# Patient Record
Sex: Female | Born: 1945 | Race: Black or African American | Hispanic: No | Smoking: Current some day smoker
Health system: Southern US, Community
[De-identification: ages and names within clinical notes are randomized; demographics above are authoritative.]

## PROBLEM LIST (undated history)

## (undated) DIAGNOSIS — J302 Other seasonal allergic rhinitis: Secondary | ICD-10-CM

## (undated) DIAGNOSIS — T7840XA Allergy, unspecified, initial encounter: Secondary | ICD-10-CM

## (undated) DIAGNOSIS — F1010 Alcohol abuse, uncomplicated: Secondary | ICD-10-CM

## (undated) DIAGNOSIS — Z8619 Personal history of other infectious and parasitic diseases: Secondary | ICD-10-CM

## (undated) DIAGNOSIS — G4700 Insomnia, unspecified: Secondary | ICD-10-CM

## (undated) DIAGNOSIS — K219 Gastro-esophageal reflux disease without esophagitis: Secondary | ICD-10-CM

## (undated) DIAGNOSIS — E7800 Pure hypercholesterolemia, unspecified: Secondary | ICD-10-CM

## (undated) DIAGNOSIS — M1990 Unspecified osteoarthritis, unspecified site: Secondary | ICD-10-CM

## (undated) DIAGNOSIS — L409 Psoriasis, unspecified: Secondary | ICD-10-CM

## (undated) HISTORY — DX: Allergy, unspecified, initial encounter: T78.40XA

## (undated) HISTORY — PX: TUBAL LIGATION: SHX77

## (undated) HISTORY — PX: ABDOMINAL HYSTERECTOMY: SHX81

---

## 2001-09-27 ENCOUNTER — Emergency Department (HOSPITAL_COMMUNITY): Admission: EM | Admit: 2001-09-27 | Discharge: 2001-09-27 | Payer: Self-pay | Admitting: Emergency Medicine

## 2002-03-22 ENCOUNTER — Encounter: Admission: RE | Admit: 2002-03-22 | Discharge: 2002-03-22 | Payer: Self-pay | Admitting: Family Medicine

## 2002-03-22 ENCOUNTER — Encounter: Payer: Self-pay | Admitting: Family Medicine

## 2003-11-08 ENCOUNTER — Encounter: Admission: RE | Admit: 2003-11-08 | Discharge: 2003-11-08 | Payer: Self-pay | Admitting: Family Medicine

## 2004-03-28 ENCOUNTER — Encounter: Admission: RE | Admit: 2004-03-28 | Discharge: 2004-03-28 | Payer: Self-pay | Admitting: Family Medicine

## 2004-11-25 ENCOUNTER — Emergency Department (HOSPITAL_COMMUNITY): Admission: EM | Admit: 2004-11-25 | Discharge: 2004-11-25 | Payer: Self-pay | Admitting: Emergency Medicine

## 2004-12-05 ENCOUNTER — Ambulatory Visit (HOSPITAL_COMMUNITY): Admission: RE | Admit: 2004-12-05 | Discharge: 2004-12-05 | Payer: Self-pay | Admitting: Orthopedic Surgery

## 2004-12-05 ENCOUNTER — Ambulatory Visit (HOSPITAL_BASED_OUTPATIENT_CLINIC_OR_DEPARTMENT_OTHER): Admission: RE | Admit: 2004-12-05 | Discharge: 2004-12-05 | Payer: Self-pay | Admitting: Orthopedic Surgery

## 2005-04-28 ENCOUNTER — Emergency Department (HOSPITAL_COMMUNITY): Admission: EM | Admit: 2005-04-28 | Discharge: 2005-04-28 | Payer: Self-pay | Admitting: Emergency Medicine

## 2005-09-18 ENCOUNTER — Ambulatory Visit (HOSPITAL_COMMUNITY): Admission: RE | Admit: 2005-09-18 | Discharge: 2005-09-18 | Payer: Self-pay | Admitting: Orthopedic Surgery

## 2005-09-18 ENCOUNTER — Ambulatory Visit (HOSPITAL_BASED_OUTPATIENT_CLINIC_OR_DEPARTMENT_OTHER): Admission: RE | Admit: 2005-09-18 | Discharge: 2005-09-18 | Payer: Self-pay | Admitting: Orthopedic Surgery

## 2005-10-17 ENCOUNTER — Encounter: Admission: RE | Admit: 2005-10-17 | Discharge: 2005-10-17 | Payer: Self-pay | Admitting: Family Medicine

## 2006-05-19 ENCOUNTER — Ambulatory Visit: Payer: Self-pay | Admitting: Family Medicine

## 2006-08-19 ENCOUNTER — Ambulatory Visit: Payer: Self-pay | Admitting: Family Medicine

## 2006-10-17 ENCOUNTER — Ambulatory Visit: Payer: Self-pay | Admitting: Family Medicine

## 2006-12-22 ENCOUNTER — Ambulatory Visit: Payer: Self-pay | Admitting: Gastroenterology

## 2007-01-04 DIAGNOSIS — E785 Hyperlipidemia, unspecified: Secondary | ICD-10-CM | POA: Insufficient documentation

## 2007-01-04 DIAGNOSIS — M1250 Traumatic arthropathy, unspecified site: Secondary | ICD-10-CM | POA: Insufficient documentation

## 2007-01-04 DIAGNOSIS — G4700 Insomnia, unspecified: Secondary | ICD-10-CM

## 2007-01-04 DIAGNOSIS — J309 Allergic rhinitis, unspecified: Secondary | ICD-10-CM | POA: Insufficient documentation

## 2007-01-05 ENCOUNTER — Encounter (INDEPENDENT_AMBULATORY_CARE_PROVIDER_SITE_OTHER): Payer: Self-pay | Admitting: Specialist

## 2007-01-05 ENCOUNTER — Ambulatory Visit: Payer: Self-pay | Admitting: Gastroenterology

## 2007-01-19 ENCOUNTER — Ambulatory Visit: Payer: Self-pay | Admitting: Family Medicine

## 2007-01-20 ENCOUNTER — Encounter: Admission: RE | Admit: 2007-01-20 | Discharge: 2007-01-20 | Payer: Self-pay | Admitting: Family Medicine

## 2007-04-24 DIAGNOSIS — K219 Gastro-esophageal reflux disease without esophagitis: Secondary | ICD-10-CM

## 2007-04-24 DIAGNOSIS — J45909 Unspecified asthma, uncomplicated: Secondary | ICD-10-CM

## 2007-04-27 ENCOUNTER — Ambulatory Visit: Payer: Self-pay | Admitting: Family Medicine

## 2007-04-27 LAB — CONVERTED CEMR LAB
ALT: 20 units/L (ref 0–40)
Cholesterol: 277 mg/dL (ref 0–200)
Direct LDL: 146.1 mg/dL
HDL: 90.2 mg/dL (ref >39.0)
VLDL: 39 mg/dL (ref 0–40)

## 2007-04-28 ENCOUNTER — Telehealth (INDEPENDENT_AMBULATORY_CARE_PROVIDER_SITE_OTHER): Payer: Self-pay | Admitting: *Deleted

## 2007-05-22 ENCOUNTER — Ambulatory Visit: Payer: Self-pay | Admitting: Family Medicine

## 2007-05-22 DIAGNOSIS — Z8739 Personal history of other diseases of the musculoskeletal system and connective tissue: Secondary | ICD-10-CM

## 2007-05-22 DIAGNOSIS — J209 Acute bronchitis, unspecified: Secondary | ICD-10-CM

## 2007-06-14 ENCOUNTER — Emergency Department (HOSPITAL_COMMUNITY): Admission: EM | Admit: 2007-06-14 | Discharge: 2007-06-14 | Payer: Self-pay | Admitting: Emergency Medicine

## 2007-07-06 ENCOUNTER — Telehealth (INDEPENDENT_AMBULATORY_CARE_PROVIDER_SITE_OTHER): Payer: Self-pay | Admitting: *Deleted

## 2007-07-07 ENCOUNTER — Ambulatory Visit: Payer: Self-pay | Admitting: Family Medicine

## 2007-07-07 DIAGNOSIS — R300 Dysuria: Secondary | ICD-10-CM | POA: Insufficient documentation

## 2007-07-07 DIAGNOSIS — B373 Candidiasis of vulva and vagina: Secondary | ICD-10-CM

## 2007-07-07 LAB — CONVERTED CEMR LAB
Bilirubin Urine: NEGATIVE
Blood in Urine, dipstick: NEGATIVE
Glucose, Urine, Semiquant: NEGATIVE
Specific Gravity, Urine: <1.005
WBC Urine, dipstick: NEGATIVE

## 2007-07-09 ENCOUNTER — Telehealth (INDEPENDENT_AMBULATORY_CARE_PROVIDER_SITE_OTHER): Payer: Self-pay | Admitting: *Deleted

## 2007-07-10 ENCOUNTER — Telehealth (INDEPENDENT_AMBULATORY_CARE_PROVIDER_SITE_OTHER): Payer: Self-pay | Admitting: Family Medicine

## 2007-07-10 ENCOUNTER — Encounter (INDEPENDENT_AMBULATORY_CARE_PROVIDER_SITE_OTHER): Payer: Self-pay | Admitting: *Deleted

## 2007-08-21 ENCOUNTER — Ambulatory Visit: Payer: Self-pay | Admitting: Family Medicine

## 2007-08-21 DIAGNOSIS — B853 Phthiriasis: Secondary | ICD-10-CM

## 2007-11-02 ENCOUNTER — Telehealth (INDEPENDENT_AMBULATORY_CARE_PROVIDER_SITE_OTHER): Payer: Self-pay | Admitting: *Deleted

## 2007-11-14 ENCOUNTER — Observation Stay (HOSPITAL_COMMUNITY): Admission: EM | Admit: 2007-11-14 | Discharge: 2007-11-15 | Payer: Self-pay | Admitting: Emergency Medicine

## 2007-11-17 ENCOUNTER — Encounter (INDEPENDENT_AMBULATORY_CARE_PROVIDER_SITE_OTHER): Payer: Self-pay | Admitting: *Deleted

## 2007-11-17 ENCOUNTER — Ambulatory Visit: Payer: Self-pay | Admitting: Family Medicine

## 2007-11-17 DIAGNOSIS — N390 Urinary tract infection, site not specified: Secondary | ICD-10-CM

## 2007-11-18 LAB — CONVERTED CEMR LAB
Chloride: 102 meq/L (ref 96–112)
Creatinine, Ser: 0.7 mg/dL (ref 0.4–1.2)
Glucose, Bld: 89 mg/dL (ref 70–99)
Potassium: 3.4 meq/L — ABNORMAL LOW (ref 3.5–5.1)
Sodium: 139 meq/L (ref 135–145)

## 2007-11-19 ENCOUNTER — Encounter (INDEPENDENT_AMBULATORY_CARE_PROVIDER_SITE_OTHER): Payer: Self-pay | Admitting: *Deleted

## 2007-11-23 ENCOUNTER — Telehealth (INDEPENDENT_AMBULATORY_CARE_PROVIDER_SITE_OTHER): Payer: Self-pay | Admitting: *Deleted

## 2008-01-05 ENCOUNTER — Telehealth (INDEPENDENT_AMBULATORY_CARE_PROVIDER_SITE_OTHER): Payer: Self-pay | Admitting: *Deleted

## 2008-01-08 ENCOUNTER — Telehealth (INDEPENDENT_AMBULATORY_CARE_PROVIDER_SITE_OTHER): Payer: Self-pay | Admitting: *Deleted

## 2008-01-08 ENCOUNTER — Ambulatory Visit: Payer: Self-pay | Admitting: Family Medicine

## 2008-01-08 ENCOUNTER — Encounter (INDEPENDENT_AMBULATORY_CARE_PROVIDER_SITE_OTHER): Payer: Self-pay | Admitting: *Deleted

## 2008-01-12 ENCOUNTER — Ambulatory Visit: Payer: Self-pay | Admitting: Family Medicine

## 2008-01-12 DIAGNOSIS — N760 Acute vaginitis: Secondary | ICD-10-CM | POA: Insufficient documentation

## 2008-01-13 ENCOUNTER — Telehealth (INDEPENDENT_AMBULATORY_CARE_PROVIDER_SITE_OTHER): Payer: Self-pay | Admitting: *Deleted

## 2008-01-13 LAB — CONVERTED CEMR LAB
Chlamydia, DNA Probe: NEGATIVE
GC Probe Amp, Genital: NEGATIVE

## 2008-03-01 ENCOUNTER — Encounter: Admission: RE | Admit: 2008-03-01 | Discharge: 2008-03-01 | Payer: Self-pay | Admitting: Family Medicine

## 2008-03-03 ENCOUNTER — Encounter (INDEPENDENT_AMBULATORY_CARE_PROVIDER_SITE_OTHER): Payer: Self-pay | Admitting: *Deleted

## 2008-04-26 ENCOUNTER — Telehealth (INDEPENDENT_AMBULATORY_CARE_PROVIDER_SITE_OTHER): Payer: Self-pay | Admitting: *Deleted

## 2008-06-23 ENCOUNTER — Emergency Department (HOSPITAL_COMMUNITY): Admission: EM | Admit: 2008-06-23 | Discharge: 2008-06-23 | Payer: Self-pay | Admitting: Family Medicine

## 2008-06-23 ENCOUNTER — Ambulatory Visit: Payer: Self-pay | Admitting: *Deleted

## 2008-07-20 ENCOUNTER — Emergency Department (HOSPITAL_COMMUNITY): Admission: EM | Admit: 2008-07-20 | Discharge: 2008-07-20 | Payer: Self-pay | Admitting: Emergency Medicine

## 2008-07-27 ENCOUNTER — Ambulatory Visit: Payer: Self-pay | Admitting: Internal Medicine

## 2008-08-02 ENCOUNTER — Ambulatory Visit (HOSPITAL_COMMUNITY): Admission: RE | Admit: 2008-08-02 | Discharge: 2008-08-02 | Payer: Self-pay | Admitting: Internal Medicine

## 2008-08-18 ENCOUNTER — Ambulatory Visit (HOSPITAL_COMMUNITY): Admission: RE | Admit: 2008-08-18 | Discharge: 2008-08-18 | Payer: Self-pay | Admitting: Internal Medicine

## 2008-10-05 ENCOUNTER — Ambulatory Visit: Payer: Self-pay | Admitting: Internal Medicine

## 2008-11-11 HISTORY — PX: MENISECTOMY: SHX5181

## 2008-11-24 ENCOUNTER — Ambulatory Visit: Payer: Self-pay | Admitting: Internal Medicine

## 2008-12-23 ENCOUNTER — Ambulatory Visit: Payer: Self-pay | Admitting: Internal Medicine

## 2009-01-09 ENCOUNTER — Ambulatory Visit: Payer: Self-pay | Admitting: Internal Medicine

## 2009-02-01 ENCOUNTER — Ambulatory Visit: Payer: Self-pay | Admitting: Internal Medicine

## 2009-03-15 ENCOUNTER — Ambulatory Visit: Payer: Self-pay | Admitting: Internal Medicine

## 2009-05-04 ENCOUNTER — Ambulatory Visit: Payer: Self-pay | Admitting: Internal Medicine

## 2009-05-11 ENCOUNTER — Ambulatory Visit: Payer: Self-pay | Admitting: Internal Medicine

## 2009-06-28 ENCOUNTER — Ambulatory Visit: Payer: Self-pay | Admitting: Internal Medicine

## 2009-08-03 ENCOUNTER — Telehealth (INDEPENDENT_AMBULATORY_CARE_PROVIDER_SITE_OTHER): Payer: Self-pay | Admitting: *Deleted

## 2009-09-27 ENCOUNTER — Ambulatory Visit: Payer: Self-pay | Admitting: Internal Medicine

## 2009-10-13 ENCOUNTER — Ambulatory Visit: Payer: Self-pay | Admitting: Internal Medicine

## 2010-01-05 ENCOUNTER — Ambulatory Visit: Payer: Self-pay | Admitting: Internal Medicine

## 2010-05-01 ENCOUNTER — Ambulatory Visit: Payer: Self-pay | Admitting: Internal Medicine

## 2010-06-14 ENCOUNTER — Emergency Department (HOSPITAL_COMMUNITY): Admission: EM | Admit: 2010-06-14 | Discharge: 2010-06-14 | Payer: Self-pay | Admitting: Emergency Medicine

## 2010-06-25 ENCOUNTER — Ambulatory Visit: Payer: Self-pay | Admitting: Internal Medicine

## 2010-07-05 ENCOUNTER — Encounter: Admission: RE | Admit: 2010-07-05 | Discharge: 2010-07-05 | Payer: Self-pay | Admitting: General Surgery

## 2010-08-20 ENCOUNTER — Ambulatory Visit: Payer: Self-pay | Admitting: Internal Medicine

## 2010-08-20 ENCOUNTER — Encounter (INDEPENDENT_AMBULATORY_CARE_PROVIDER_SITE_OTHER): Payer: Self-pay | Admitting: Internal Medicine

## 2010-08-20 LAB — CONVERTED CEMR LAB
ALT: 27 units/L (ref 0–35)
BUN: 16 mg/dL (ref 6–23)
Basophils Absolute: 0 10*3/uL (ref 0.0–0.1)
CO2: 27 meq/L (ref 19–32)
Calcium: 9.9 mg/dL (ref 8.4–10.5)
Chloride: 102 meq/L (ref 96–112)
Creatinine, Ser: 0.75 mg/dL (ref 0.40–1.20)
Glucose, Bld: 87 mg/dL (ref 70–99)
Hemoglobin: 12.9 g/dL (ref 12.0–15.0)
Lymphocytes Relative: 49 % — ABNORMAL HIGH (ref 12–46)
Lymphs Abs: 2.6 10*3/uL (ref 0.7–4.0)
Monocytes Absolute: 0.5 10*3/uL (ref 0.1–1.0)
Monocytes Relative: 9 % (ref 3–12)
Neutro Abs: 2.3 10*3/uL (ref 1.7–7.7)
RBC: 4.11 M/uL (ref 3.87–5.11)
WBC: 5.4 10*3/uL (ref 4.0–10.5)

## 2010-08-22 ENCOUNTER — Ambulatory Visit (HOSPITAL_COMMUNITY): Admission: RE | Admit: 2010-08-22 | Discharge: 2010-08-22 | Payer: Self-pay | Admitting: Internal Medicine

## 2010-10-23 ENCOUNTER — Emergency Department (HOSPITAL_COMMUNITY)
Admission: EM | Admit: 2010-10-23 | Discharge: 2010-10-23 | Payer: Self-pay | Source: Home / Self Care | Admitting: Emergency Medicine

## 2010-12-02 ENCOUNTER — Encounter: Payer: Self-pay | Admitting: Family Medicine

## 2010-12-03 ENCOUNTER — Observation Stay (HOSPITAL_COMMUNITY)
Admission: EM | Admit: 2010-12-03 | Discharge: 2010-12-04 | Payer: Self-pay | Source: Home / Self Care | Attending: Internal Medicine | Admitting: Internal Medicine

## 2010-12-04 LAB — COMPREHENSIVE METABOLIC PANEL
AST: 37 U/L (ref 0–37)
Alkaline Phosphatase: 93 U/L (ref 39–117)
Alkaline Phosphatase: 88 U/L (ref 39–117)
BUN: 8 mg/dL (ref 6–23)
CO2: 25 mEq/L (ref 19–32)
CO2: 26 mEq/L (ref 19–32)
Calcium: 9.2 mg/dL (ref 8.4–10.5)
Chloride: 106 mEq/L (ref 96–112)
Chloride: 107 mEq/L (ref 96–112)
Creatinine, Ser: 0.88 mg/dL (ref 0.4–1.2)
GFR calc Af Amer: >60 mL/min (ref >60)
GFR calc non Af Amer: >60 mL/min (ref >60)
GFR calc non Af Amer: >60 mL/min (ref >60)
Glucose, Bld: 104 mg/dL — ABNORMAL HIGH (ref 70–99)
Potassium: 4 mEq/L (ref 3.5–5.1)
Sodium: 140 mEq/L (ref 135–145)
Total Bilirubin: 0.9 mg/dL (ref 0.3–1.2)
Total Protein: 6.7 g/dL (ref 6.0–8.3)

## 2010-12-04 LAB — APTT: aPTT: 32 seconds (ref 24–37)

## 2010-12-04 LAB — DIFFERENTIAL
Basophils Absolute: 0 10*3/uL (ref 0.0–0.1)
Basophils Relative: 1 % (ref 0–1)
Eosinophils Relative: 1 % (ref 0–5)
Lymphocytes Relative: 42 % (ref 12–46)
Monocytes Absolute: 0.4 10*3/uL (ref 0.1–1.0)
Monocytes Relative: 6 % (ref 3–12)
Neutrophils Relative %: 50 % (ref 43–77)

## 2010-12-04 LAB — CARDIAC PANEL(CRET KIN+CKTOT+MB+TROPI)
Relative Index: INVALID (ref 0.0–2.5)
Total CK: 59 U/L (ref 7–177)

## 2010-12-04 LAB — PROTIME-INR
INR: 0.97 (ref 0.00–1.49)
INR: 0.96 (ref 0.00–1.49)
Prothrombin Time: 13.1 seconds (ref 11.6–15.2)

## 2010-12-04 LAB — RAPID URINE DRUG SCREEN, HOSP PERFORMED
Benzodiazepines: NOT DETECTED
Opiates: NOT DETECTED

## 2010-12-04 LAB — CBC
Hemoglobin: 13 g/dL (ref 12.0–15.0)
MCH: 32.9 pg (ref 26.0–34.0)
MCHC: 34.7 g/dL (ref 30.0–36.0)
MCHC: 34.6 g/dL (ref 30.0–36.0)
MCV: 94.6 fL (ref 78.0–100.0)
MCV: 95.7 fL (ref 78.0–100.0)
Platelets: 284 10*3/uL (ref 150–400)
RBC: 3.93 MIL/uL (ref 3.87–5.11)
RDW: 14.3 % (ref 11.5–15.5)

## 2010-12-04 LAB — RPR: RPR Ser Ql: NONREACTIVE

## 2010-12-04 LAB — URINALYSIS, ROUTINE W REFLEX MICROSCOPIC
Hgb urine dipstick: NEGATIVE
Ketones, ur: NEGATIVE mg/dL
Nitrite: NEGATIVE
Protein, ur: NEGATIVE mg/dL
Urine Glucose, Fasting: NEGATIVE mg/dL
pH: 6 (ref 5.0–8.0)

## 2010-12-04 LAB — POCT CARDIAC MARKERS
CKMB, poc: <1 ng/mL — ABNORMAL LOW (ref 1.0–8.0)
Myoglobin, poc: 68.2 ng/mL (ref 12–200)

## 2010-12-04 LAB — AMMONIA: Ammonia: 21 umol/L (ref 11–35)

## 2010-12-04 LAB — HEMOGLOBIN A1C
Hgb A1c MFr Bld: 5.4 % (ref <5.7)
Mean Plasma Glucose: 108 mg/dL (ref <117)

## 2010-12-05 LAB — LIPID PANEL
Cholesterol: 205 mg/dL — ABNORMAL HIGH (ref 0–200)
HDL: 58 mg/dL (ref >39)
Triglycerides: 183 mg/dL — ABNORMAL HIGH (ref <150)

## 2010-12-05 LAB — URINE CULTURE: Colony Count: 25000

## 2010-12-10 NOTE — Discharge Summary (Signed)
[name], [name]                    ACCOUNT NO.:  [id number]  MEDICAL RECORD NO.:  [id number]          PATIENT TYPE:  INP  LOCATION:  1443                         FACILITY:  [facility]  PHYSICIAN:  Isidor Holts, M.D.  DATE OF BIRTH:  05-May-1946  DATE OF ADMISSION:  12/03/2010 DATE OF DISCHARGE:  12/04/2010                              DISCHARGE SUMMARY   PRIMARY MD:  Dineen Kid. Reche Dixon, M.D., [facility]  PRIMARY ONCOLOGIST:  Dr. Minda Ditto at [facility].  DISCHARGE DIAGNOSES: 1. Transient altered mental status/dysarthria. Possible transient     ischemic attack. 2. 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 Surgery     scheduled for December 27, 2010 at [facility]. 3. Bronchial asthma. 4. Gastroesophageal reflux disease. 5. Osteoarthritis. 6. Smoking history.  DISCHARGE MEDICATIONS: 1. Aspirin 325 mg p.o. daily (he was on 81 mg p.o. daily). 2. Advair Diskus (5/50) 1 puff b.i.d. 3. Albuterol inhaler 2 puffs p.r.n. q.6h. for shortness of     breath/wheeze. 4. Celebrex 200 mg p.o. daily. 5. Famotidine 20 mg p.o. b.i.d. 6. Nasacort AQ one spray each nostril p.r.n. daily for allergies. 7. Premarin 1.25 mg p.o. daily. 8. Singulair 10 mg p.o. daily. 9. Tramadol 50 mg p.o. t.i.d. 10.Trazodone 50 mg p.o. q.h.s. 11.Vistaril 25 mg p.o. t.i.d. 12.Xyzal 5 mg p.o. daily. 13.Zetia 10 mg p.o. daily.  PROCEDURES: 1. Chest x-ray on December 03, 2010:  This showed no active disease. 2. Head CT scan on December 03, 2010:  This showed no acute     intracranial findings.  There was mild progressive atrophy. 3. Brain MRI on December 03, 2010:  This was negative for acute infarct     or mass lesion. 4. Brain MRA on December 03, 2010:  This was a negative study. 5. Bilateral carotid/vertebral artery duplex scan on December 04, 2010:     This showed no evidence of ICA stenosis.  Vertebral artery flow was     antegrade. 6. The 2-D echocardiogram on December 04, 2010:  This  showed normal     left ventricle cavity size.  There was mild concentric hypertrophy.     Systolic function was normal.  Estimated EF in the region of  50%     to 65%, left ventricular diastolic function parameters were normal.  CONSULTATIONS:  None.  ADMISSION HISTORY:  As in H and P notes of December 03, 2010, dictated by Dr. Erick Blinks. However, in brief this is a 65 year old female, with known history of bronchial asthma, GERD, recently diagnosed left renal cell carcinoma, osteoarthritis, smoking history, presenting with a transient episode of confusion and dysarthria, which ocurred when she was getting ready for church in a.m. of December 02, 2010.  She subsequently presented to the emergency department and was admitted for further evaluation, investigation and management on suspicion of possible CVA versus TIA, or other intracranial lesions.  CLINICAL COURSE: 1. TIA:  Patient presented with transient confusion/dysarthria.     Physical examination revealed no focal neurologic deficit.  She     underwent CVA/TIA workup.  For details of findings refer to  procedure list above.  This was negative and patient's rest of     course of hospitalization showed no arrhythmias on telemetric     monitoring neither she have any recurrence of symptoms or indeed     any focal neurologic deficit.  Likely, patient has suffered a TIA.     Aspirin has been increased to 325 mg p.o. daily instead of the 81     mg daily, which she had been on, prior to the admission.  2. Left renal cell carcinoma:  This apparently, was recently diagnosed     and patient is scheduled for surgery at [facility] on     December 27, 2010.  Fortunately, brain imaging studies showed no     evidence of metastatic disease.  She is encouraged to keep her     scheduled appointment at [facility].  3. Bronchial asthma:  Patient was asymptomatic from this viewpoint.  4. GERD:  No new symptoms referable to  this.  5. Osteoarthritis:  This did not prove problematic.  6. Smoking history:  Patient has been counseled appropriately.  DISPOSITION:  Patient was on December 04, 2010 asymptomatic.  There were no new issues.  She was considered clinically stable for discharge and therefore discharged accordingly.  Of note, her lipid profile showed total cholesterol of 205, triglyceride of 183, HDL of 58, LDL of 110. Patient is currently on Zetia, likely this is an excellent lipid profile, although target LDL of less may be beneficial given suspicion of TIA.  We shall defer followup to her primary MD.  Patient's homocysteine level was normal at 13.4.  RPR was negative.  Patient was discharged on December 04, 2010.  ACTIVITY:  As tolerated.  DIET:  Heart-healthy.  FOLLOWUP INSTRUCTIONS:  Patient is to follow up with her primary MD, Dr. Donia Guiles at [facility] per prior scheduled appointment and also with the primary oncologist, Dr. Minda Ditto, per prior scheduled appointment.     Isidor Holts, M.D.     CO/MEDQ  D:  12/04/2010  T:  12/04/2010  Job:  [id number]  cc:   Dineen Kid. Reche Dixon, M.D. Fax: [phone]  Thayer Ohm M.D. Margo Aye  Electronically Signed by Isidor Holts M.D. on 12/10/2010 04:24:04 PM

## 2010-12-12 HISTORY — PX: KIDNEY SURGERY: SHX687

## 2010-12-25 NOTE — H&P (Signed)
[name], [name]                    ACCOUNT NO.:  [id number]  MEDICAL RECORD NO.:  [id number]          PATIENT TYPE:  EMS  LOCATION:  ED                           FACILITY[facility]  PHYSICIAN:  Erick Blinks, MD     DATE OF BIRTH:  Sep 03, 1946  DATE OF ADMISSION:  12/03/2010 DATE OF DISCHARGE:                             HISTORY & PHYSICAL   CHIEF COMPLAINT:  Confusion.  HISTORY OF PRESENT ILLNESS:  This is a 65 year old female with history of asthma; hypercholesterolemia; recently diagnosed renal cancer, followed up at [facility]; and tobacco abuse, who presents to the emergency room with her daughter with complaints of confusion.  According to both the patient and her daughter, the patient was in her usual state of health when yesterday morning her daughter noted that she was increasingly confused and not making any sense when she was speaking. The patient reports getting ready for church yesterday morning and does not remember anything after that until this morning.  According to the daughter, she has not had any fever.  She is not complaining of any dysuria.  She has not had any vomiting or diarrhea or any abdominal pain.  She has no chest pain, shortness of breath, or cough.  She reports that she had noticed that her mother had some slurring of her speech yesterday.  She was increasingly dizzy when she was walking around and she generally was not making any sense.  The patient denies any changes in her vision.  She herself denies any unilateral weakness or numbness.  The patient has had extensive workup in the emergency room which has been relatively unrevealing and she has been referred for admission.  PAST MEDICAL HISTORY: 1. Asthma. 2. GERD. 3. Recently diagnosed renal cancer, followed at [facility] for outpatient     surgery on February 16. 4. Osteoarthritis. 5. Tobacco abuse.  ALLERGIES:  No known drug allergies.  MEDICATIONS:  Prior to admission: 1.  Hydrocodone/acetaminophen. 2. Tylenol. 3. Ambien. 4. Singulair.  SOCIAL HISTORY:  The patient smokes approximately 3 cigarettes per day. She reports drinking 2 light beers every day.  She walks without the assistance of a cane or walker.  FAMILY HISTORY:  Noncontributory.  REVIEW OF SYSTEMS:  All systems have been reviewed and pertinent positives stated in the HPI.  PHYSICAL EXAM:  VITAL SIGNS:  Temperature of 98.2 oral, 99.3 rectal; respiratory rate of 20; heart rate of 100; blood pressure 121/86; oxygen saturation 96% on room air. GENERAL:  The patient is in no acute distress, lying comfortably in bed. HEENT:  Normocephalic, atraumatic.  Pupils are equal, round, reactive to light.  Extraocular motions are intact. NECK:  Supple. CHEST:  Clear to auscultation bilaterally. CARDIAC:  S1, S2 with a regular rate and rhythm. ABDOMEN:  Soft, nontender.  Bowel sounds are active. EXTREMITIES:  No cyanosis, clubbing, or edema. NEUROLOGIC:  The patient has equal strength bilaterally, 5/5.  Plantars are downgoing.  Cranial nerves II-XII are grossly intact.  The patient has no pronator drift.  She does not have any cerebellar signs.  She does have been some slight facial  asymmetry.  Her left nasolabial fold is less pronounced.  LABORATORY DATA:  WBC 5.7, hemoglobin 14, platelets 300.  Cardiac enzymes at point-of-care negative.  EKG does not show any acute ST-T changes.  INR 0.97.  Urine drug screen is negative.  Urinalysis does not show any signs of infection.  Sodium 141, potassium 3.5, chloride 106, bicarb 25, BUN 10, creatinine 0.82, glucose of 104, and calcium of 9.2. CT head shows no acute intracranial findings, mild progressive atrophy. Chest x-ray shows no active disease.  Alcohol level was 6, lactic acid 1.2.  Tylenol level less than 10.  Salicylate less than 4.  Ammonia of 21.  ASSESSMENT AND PLAN: 1. Confusion.  There is concern for transient ischemic attack/stroke     in  this situation.  We will admit the patient to telemetry, check     an MRI of her brain as well as carotid Dopplers and 2-D echo.  We     will also check TSH, B12, and RPR. 2. Tobacco abuse.  She will have a tobacco cessation consult. 3. Asthma, this is stable at this time. 4. Renal cell cancer.  The patient will follow up at [facility] for     further management. 5. Code status.  The patient is a full code.  Note, we will also hold her Ambien and Lortab as this may be contributing to her confusion.     Erick Blinks, MD     JM/MEDQ  D:  12/03/2010  T:  12/03/2010  Job:  [id number]  Electronically Signed by Durward Mallard MEMON  on 12/25/2010 09:04:12 PM

## 2011-01-21 LAB — POCT URINALYSIS DIPSTICK
Glucose, UA: NEGATIVE mg/dL
Nitrite: NEGATIVE
Specific Gravity, Urine: <=1.005 (ref 1.005–1.030)
Urobilinogen, UA: 0.2 mg/dL (ref 0.0–1.0)

## 2011-03-07 ENCOUNTER — Inpatient Hospital Stay (INDEPENDENT_AMBULATORY_CARE_PROVIDER_SITE_OTHER)
Admission: RE | Admit: 2011-03-07 | Discharge: 2011-03-07 | Disposition: A | Discharge status: Home / Self Care | Payer: Self-pay | Source: Ambulatory Visit | Attending: Family Medicine | Admitting: Family Medicine

## 2011-03-07 ENCOUNTER — Emergency Department (HOSPITAL_COMMUNITY)
Admission: EM | Admit: 2011-03-07 | Discharge: 2011-03-07 | Disposition: A | Discharge status: Home / Self Care | Payer: Medicaid Other | Attending: Emergency Medicine | Admitting: Emergency Medicine

## 2011-03-07 DIAGNOSIS — E785 Hyperlipidemia, unspecified: Secondary | ICD-10-CM | POA: Insufficient documentation

## 2011-03-07 DIAGNOSIS — R109 Unspecified abdominal pain: Secondary | ICD-10-CM | POA: Insufficient documentation

## 2011-03-07 DIAGNOSIS — R10812 Left upper quadrant abdominal tenderness: Secondary | ICD-10-CM

## 2011-03-07 DIAGNOSIS — E7800 Pure hypercholesterolemia, unspecified: Secondary | ICD-10-CM | POA: Insufficient documentation

## 2011-03-07 DIAGNOSIS — K219 Gastro-esophageal reflux disease without esophagitis: Secondary | ICD-10-CM | POA: Insufficient documentation

## 2011-03-07 LAB — POCT URINALYSIS DIP (DEVICE)
Bilirubin Urine: NEGATIVE
Glucose, UA: NEGATIVE mg/dL
Hgb urine dipstick: NEGATIVE
Nitrite: NEGATIVE

## 2011-03-26 NOTE — H&P (Signed)
NAMEDONNICE, NIELSEN                    ACCOUNT NO.:  [id number]   MEDICAL RECORD NO.:  [id number]          PATIENT TYPE:  INP   LOCATION:  0103                         FACILITY:  [facility]   PHYSICIAN:  Kela Millin, M.D.DATE OF BIRTH:  1946-04-06   DATE OF ADMISSION:  11/14/2007  DATE OF DISCHARGE:                              HISTORY & PHYSICAL   PRIMARY CARE PHYSICIAN:  Primary care physician is [facility]  on [employer].  Family/patient does not recall physician  name.   CHIEF COMPLAINT:  Confusion.   HISTORY OF PRESENT ILLNESS:  The patient is a 65 year old black female  with past medical history only significant for hyperlipidemia who  presents with the above complaints.  The history is obtained mostly from  the ER staff and records.  It is reported that the patient's husband was  present initially and stated at that time that the patient had been  confused for the past several days and that it had taken him several  hours just to get her dressed because she did know where the closet at  home was.  It was also reported that the patient had urinary  incontinence.  At the time of my exam Ms. Raney is alert.  She is oriented  to place only but she is able to answer questions appropriately.  She  admits to dysuria for the past 3 days.  She denies fevers, cough,  abdominal pain, melena, diarrhea and no hematochezia.   In the ER she had a CT scan of the head done which was negative for any  acute findings.  A urinalysis was done and revealed moderate leukocyte  esterase with urine nitrite positive, urine WBCs too numerous to count  and many bacteria, few epithelial cells on the specimen.  A urine drug  screen was done which was negative.  An alcohol level was also less than  5.  She was admitted for further evaluation and management.   PAST MEDICAL HISTORY:  1. As above.  2. History of tobacco abuse.   MEDICATIONS:  Albuterol, Allegra, aspirin and Lipitor - no  known  dosages.   ALLERGIES:  NKDA.   SOCIAL HISTORY:  Positive for tobacco times 37 years.  She denies  alcohol.   FAMILY HISTORY:  Reviewed and noncontributory to current illness.   REVIEW OF SYSTEMS:  As per HPI.   PHYSICAL EXAMINATION:  GENERAL:  The patient is a pleasant older black  female.  She is in no respiratory distress.  VITAL SIGNS:  Temperature is 98.1 with a blood pressure of 122/81, pulse  of 100, respiratory rate of 16, O2 sat of 100%.  HEENT:  PERRL, EOMI, sclerae anicteric, dry mucous membranes.  No oral  exudates.  NECK:  Supple, no adenopathy, no thyromegaly and no JVD.  LUNGS:  Clear to auscultation bilaterally.  No crackles or wheezes.  CARDIOVASCULAR:  Regular rate and rhythm.  Normal S1-S2.  ABDOMEN:  Soft, bowel sounds present, nontender, nondistended.  No  organomegaly and no masses palpable.  EXTREMITIES:  No cyanosis and no  edema.  NEUROLOGICAL:  She is alert, she is oriented to place only.  She follows  commands.  Cranial nerves II-XII grossly intact.  Strength is 5/5 and  symmetric.  Nonfocal exam.   LABORATORY DATA:  CT scan, urinalysis and urine drug screen as per HPI.  Her white cell count is 9.5, hemoglobin of 14.1, hematocrit 40.7,  platelet count of 331, neutrophil count 58%.  Tylenol level is less than  10.  Salicylate level less than 4.  Sodium is 142, potassium 3.5,  chloride 104, CO2 26, glucose 120, BUN 25, creatinine 1.23, total  protein is 7.3 and her AST is 55, albumin of 4.1.   ASSESSMENT AND PLAN:  1. Urinary tract infection - obtain urine cultures, start on empiric      antibiotics.  2. Confusion - CT scan of head negative as well as urine drug screen.      Likely secondary to #1.  Treat as above and follow.  3. Volume depletion/azotemia - hydrate and recheck.  4. Hyperlipidemia - continue Lipitor.  5. History of tobacco abuse - smoking cessation consult.      Kela Millin, M.D.  Electronically Signed      ACV/MEDQ  D:  11/14/2007  T:  11/14/2007  Job:  [id number]   cc:   Deboraha Sprang FP at [facility]

## 2011-03-29 NOTE — Op Note (Signed)
NAMEDILPREET, FAIRES                    ACCOUNT NO.:  [id number]   MEDICAL RECORD NO.:  [id number]          PATIENT TYPE:  AMB   LOCATION:  DSC                          FACILITY:  [facility]   PHYSICIAN:  Feliberto Gottron. Turner Daniels, M.D.   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   PROCEDURE:  Left knee partial medial meniscectomy, partial meniscectomy and  debridement of chondromalacia from the medial femoral condyle.   SURGEON:  Feliberto Gottron. Turner Daniels, M.D.   FIRST ASSISTANT:  __________ , P.A.-C.   ANESTHETIC:  General endotracheal.   ESTIMATED BLOOD LOSS:  Minimal.   FLUIDS REPLACED:  800 cc of crystalloid.   DRAINS PLACED:  None.   TOURNIQUET TIME:  None.   INDICATIONS FOR PROCEDURE:  A 65 year old woman with symptomatic medial  meniscal tear who has failed conservative treatment and desires elective  arthroscopic evaluation and treatment of same.  Positive McMurray's test,  positive medial joint line tenderness and again she has failed conservative  measures.   DESCRIPTION OF PROCEDURE:  The patient identified by arm band, taken to the  operating room at [facility], appropriate monitors were  attached and general endotracheal anesthesia induced.  With the patient in  the supine position and a lateral post applied to the table, the left lower  extremity was prepped and draped in the usual sterile fashion from the ankle  to the mid thigh.  Using a #11 blade, standard inferomedial and  inferolateral peripatellar portals were then made, allowing introduction of  the arthroscope through the inferolateral portal and the  outflow through the  inferomedial portal.  Diagnostic arthroscopy revealed a normal  patellofemoral joint and suprapatellar pouch.  Moving into the medial  compartment, an obvious displaced parrot beak tear of the medial meniscus  was identified and removed with a 3.5 gator sucker shaver and a straight  biter.  The ACL and PCL were intact.  Moving into the lateral compartment,  some small loose bodies were encountered and removed and there was a  degenerative tearing of the lateral meniscus which was debrided back to a  stable margin as well.  The medial femoral condyle had some grade 2 to grade  3 chondromalacia which was also debrided to the far medial edge.  At this  point, the knee was irrigated out with normal saline solution, the gutters  were cleared medially and laterally, the  posterior horn was visualized going medial and lateral to the PCL and the  arthroscopic instruments removed.  A dressing of Xeroform, 4 x4, dressing,  sponges, Webril, and Ace wrap were applied, the patient was then awakened  and taken to the recovery room without difficulty.      FJR/MEDQ  D:  12/05/2004  T:  12/05/2004  Job:  [id number]

## 2011-03-29 NOTE — Op Note (Signed)
NAMESILVANNA, [name]                    ACCOUNT NO.:  [id number]   MEDICAL RECORD NO.:  [id number]          PATIENT TYPE:  AMB   LOCATION:  DSC                          FACILITY:  [facility]   PHYSICIAN:  Feliberto Gottron. Turner Daniels, M.D.   DATE OF BIRTH:  [birth year]   DATE OF PROCEDURE:  09/18/2005  DATE OF DISCHARGE:                                 OPERATIVE REPORT   PREOPERATIVE DIAGNOSES:  1.  Left knee medial meniscal tear, recurrent.  2.  Right second toe metatarsal Freiberg's infarction.   POSTOPERATIVE DIAGNOSIS:  1.  Left knee medial meniscal tear, recurrent.  2.  Right second toe metatarsal Freiberg's infarction.   PROCEDURE:  1.  Left knee partial arthroscopic medial meniscectomy.  2.  Right second toe Hoffman resection arthroplasty at the MTP joint.   SURGEON:  Feliberto Gottron. Turner Daniels, M.D.   FIRST ASSISTANT:  Erskine Squibb B. Jannet Mantis.   ANESTHETIC:  General LMA.   ESTIMATED BLOOD LOSS:  Minimal.   FLUID REPLACEMENT:  100 cc crystalloid.   DRAINS PLACED:  None.   TOURNIQUET TIME:  On the right ankle only -- was about 12 minutes.   INDICATIONS FOR PROCEDURE:  MRI-proven left knee medial meniscal tear,  recurrent; with pain, catching and popping -- requiring arthroscopic  decompression.  Also on the right side, she has a Freiberg's infarction,  proven by x-ray; painful and refractory to observation and anti-inflammatory  medicine treatment. She desires elective left knee partial medial  meniscectomy and right knee Hoffman resection arthroplasty to decrease pain  and increase function in her left knee and right foot respectively.  The  risks and benefits of surgery were discussed preoperatively and all  questions answered.   DESCRIPTION OF PROCEDURE:  The patient identified by armband, taken to the  operating room at [facility], where the appropriate  anesthetic monitors were attached and general LMA anesthesia induced with  the patient in the supine position. Lateral post  applied to the left side of  the table, and a right ankle tourniquet applied.  Then the left lower  extremity was prepped and draped in the usual sterile fashion from the ankle  to the mid-thigh.  The right foot was prepped and draped in the usual  sterile fashion from the toes to the tourniquet. We began the procedure by  making a standard inferomedial and inferolateral peripatellar portals on the  left knee, allowing introduction of the arthroscope through the  inferolateral portal and the outflow through the inferomedial portal. The  pump pressure was set between 60 and 80 mmHg.  Diagnostic arthroscopy  revealed a normal suprapatellar pouch and patella. Moving into the medial  compartment, further tearing of the posterior medial horn of the medial  meniscus was identified and removed with a 3.5 gator sucker shaver; then  thoroughly probed, confirming removal of the torn cartilage. Moving into the  notch, the ACL and the PCL were intact. The posterolateral horn of the  medial meniscus was also found to be intact. The lateral compartment was  then examined and found to  be in excellent condition. The gutters were  cleared medially and laterally, and the suprapatellar pouch was also  cleared.  The articular cartilage had little, if any, chondromalacia. The  knee was irrigated out with normal saline solution. The arthroscopic  instruments were removed.  A dressing of Xeroform 4x4 dressing sponges,  Webril and an Ace wrap applied.   We then directed our attention to the right foot.  The foot was wrapped with  an Ace wrap for compression.  The tourniquet inflated to 250 mmHg.  A dorsal  midline incision, starting 1 cm distal to the MTP joint and going proximally  for 2.5 cm more was then made through the skin and just into the  subcutaneous tissue. Small bleeders were identified and cauterized. The  retinaculum over the extensor tendon was incised on the fibular side of the  tendon, allowing  [place] to get down to the bone of the metatarsal distally.  We  then stripped periosteum medially and laterally, and medially encountered a  fragmented head of the second metatarsal -- which was removed, including  complete delamination of the articular cartilage.  Baby Hohmann retractors  were placed medially and laterally, allowing [place] to resect the metatarsal  head with the ACL saw, and remove any remaining fragments of bone from the  collateral ligaments.  The wound was then thoroughly irrigated out with  normal saline solution. The tourniquet let down. Small bleeders identified  and cauterized. The toe, which was originally about 4-5 mm longer than the  great toe, settled into a position just shorter than the great toe -- which  should also benefit the patient as well. Satisfied with the position, a  layered closure of the subcutaneous tissue with 3-0 running Vicryl suture,  and the skin with running interlocking 4-0 nylon suture was accomplished.  Then a dressing of Tweeners, 4x4 dressing sponges, Webril and an Ace wrap  and Darco shoe applied.   The patient was then awakened and taken to the recovery room, after also  dressing the left knee with Xeroform 4x4 dressing sponges, Webril and an Ace  wrap.      Feliberto Gottron. Turner Daniels, M.D.  Electronically Signed     FJR/MEDQ  D:  09/18/2005  T:  09/18/2005  Job:  [id number]

## 2011-04-12 HISTORY — PX: BUNIONECTOMY: SHX129

## 2011-05-29 ENCOUNTER — Telehealth: Payer: Self-pay | Admitting: *Deleted

## 2011-05-29 NOTE — Telephone Encounter (Signed)
Pt has never been seen [facility] - has been seeing Dr Reche Dixon through [facility]. Called [facility] and informed not pt [facility]. Stanton Kidney Mehdi Gironda RN 05/29/11 2PM

## 2011-07-13 HISTORY — PX: HERNIA REPAIR: SHX51

## 2011-07-26 ENCOUNTER — Ambulatory Visit: Payer: Medicaid Other | Attending: Podiatry | Admitting: Physical Therapy

## 2011-07-26 DIAGNOSIS — R5381 Other malaise: Secondary | ICD-10-CM | POA: Insufficient documentation

## 2011-07-26 DIAGNOSIS — IMO0001 Reserved for inherently not codable concepts without codable children: Secondary | ICD-10-CM | POA: Insufficient documentation

## 2011-07-26 DIAGNOSIS — M25673 Stiffness of unspecified ankle, not elsewhere classified: Secondary | ICD-10-CM | POA: Insufficient documentation

## 2011-07-26 DIAGNOSIS — M25579 Pain in unspecified ankle and joints of unspecified foot: Secondary | ICD-10-CM | POA: Insufficient documentation

## 2011-07-26 DIAGNOSIS — M25676 Stiffness of unspecified foot, not elsewhere classified: Secondary | ICD-10-CM | POA: Insufficient documentation

## 2011-08-01 ENCOUNTER — Encounter: Payer: Medicaid Other | Admitting: Physical Therapy

## 2011-08-01 LAB — COMPREHENSIVE METABOLIC PANEL
ALT: 68 — ABNORMAL HIGH
AST: 55 — ABNORMAL HIGH
Albumin: 4.1
Alkaline Phosphatase: 119 — ABNORMAL HIGH
CO2: 26
Chloride: 104
GFR calc Af Amer: 54 — ABNORMAL LOW
GFR calc non Af Amer: 45 — ABNORMAL LOW
Potassium: 3.5
Sodium: 142
Total Bilirubin: 1.6 — ABNORMAL HIGH

## 2011-08-01 LAB — RAPID URINE DRUG SCREEN, HOSP PERFORMED
Amphetamines: NOT DETECTED
Barbiturates: NOT DETECTED
Benzodiazepines: NOT DETECTED
Cocaine: NOT DETECTED
Opiates: NOT DETECTED
Tetrahydrocannabinol: NOT DETECTED

## 2011-08-01 LAB — URINALYSIS, ROUTINE W REFLEX MICROSCOPIC
Glucose, UA: 100 — AB
Ketones, ur: 15 — AB
Nitrite: POSITIVE — AB
Protein, ur: >300 — AB
Specific Gravity, Urine: 1.03
Urobilinogen, UA: 1
pH: 5.5

## 2011-08-01 LAB — BASIC METABOLIC PANEL
BUN: 9
Chloride: 112
Creatinine, Ser: 0.87
GFR calc non Af Amer: >60
Glucose, Bld: 114 — ABNORMAL HIGH

## 2011-08-01 LAB — CBC
HCT: 40.7
Hemoglobin: 14.1
MCHC: 34.5
MCV: 96.3
Platelets: 331
RBC: 4.23
RDW: 16.1 — ABNORMAL HIGH
WBC: 9.5

## 2011-08-01 LAB — DIFFERENTIAL
Basophils Absolute: 0.1
Basophils Relative: 1
Eosinophils Absolute: 0
Eosinophils Relative: 0
Lymphocytes Relative: 32
Lymphs Abs: 3
Monocytes Absolute: 0.8
Monocytes Relative: 9
Neutro Abs: 5.5
Neutrophils Relative %: 58

## 2011-08-01 LAB — URINE MICROSCOPIC-ADD ON

## 2011-08-01 LAB — COMPREHENSIVE METABOLIC PANEL WITH GFR
BUN: 25 — ABNORMAL HIGH
Calcium: 9
Creatinine, Ser: 1.23 — ABNORMAL HIGH
Glucose, Bld: 120 — ABNORMAL HIGH
Total Protein: 7.3

## 2011-08-01 LAB — URINE CULTURE: Colony Count: >=100000

## 2011-08-01 LAB — ACETAMINOPHEN LEVEL: Acetaminophen (Tylenol), Serum: <10 — ABNORMAL LOW

## 2011-08-01 LAB — SALICYLATE LEVEL: Salicylate Lvl: <4

## 2011-08-01 LAB — ETHANOL: Alcohol, Ethyl (B): <5

## 2011-08-02 ENCOUNTER — Ambulatory Visit: Payer: Medicaid Other | Admitting: Physical Therapy

## 2011-08-02 ENCOUNTER — Encounter: Payer: Medicaid Other | Admitting: Physical Therapy

## 2011-08-06 ENCOUNTER — Encounter: Payer: Medicaid Other | Admitting: Physical Therapy

## 2011-08-09 ENCOUNTER — Encounter: Payer: Medicaid Other | Admitting: Physical Therapy

## 2011-08-26 LAB — URINALYSIS, ROUTINE W REFLEX MICROSCOPIC
Bilirubin Urine: NEGATIVE
Glucose, UA: NEGATIVE
Ketones, ur: NEGATIVE
Protein, ur: NEGATIVE
pH: 7

## 2011-08-26 LAB — URINE CULTURE
Colony Count: NO GROWTH
Culture: NO GROWTH

## 2011-11-12 ENCOUNTER — Encounter: Payer: Self-pay | Admitting: *Deleted

## 2011-11-12 ENCOUNTER — Observation Stay (HOSPITAL_COMMUNITY)
Admission: EM | Admit: 2011-11-12 | Discharge: 2011-11-13 | DRG: 418 | Disposition: A | Discharge status: Home / Self Care | Payer: Medicaid Other | Attending: General Surgery | Admitting: General Surgery

## 2011-11-12 ENCOUNTER — Emergency Department (HOSPITAL_COMMUNITY): Payer: Medicaid Other

## 2011-11-12 DIAGNOSIS — K8000 Calculus of gallbladder with acute cholecystitis without obstruction: Principal | ICD-10-CM | POA: Diagnosis present

## 2011-11-12 DIAGNOSIS — R1013 Epigastric pain: Secondary | ICD-10-CM

## 2011-11-12 DIAGNOSIS — R1011 Right upper quadrant pain: Secondary | ICD-10-CM

## 2011-11-12 DIAGNOSIS — Z905 Acquired absence of kidney: Secondary | ICD-10-CM

## 2011-11-12 DIAGNOSIS — J45909 Unspecified asthma, uncomplicated: Secondary | ICD-10-CM | POA: Diagnosis present

## 2011-11-12 DIAGNOSIS — G459 Transient cerebral ischemic attack, unspecified: Secondary | ICD-10-CM

## 2011-11-12 DIAGNOSIS — K7689 Other specified diseases of liver: Secondary | ICD-10-CM | POA: Diagnosis present

## 2011-11-12 DIAGNOSIS — Z85528 Personal history of other malignant neoplasm of kidney: Secondary | ICD-10-CM

## 2011-11-12 DIAGNOSIS — N390 Urinary tract infection, site not specified: Secondary | ICD-10-CM | POA: Diagnosis present

## 2011-11-12 DIAGNOSIS — Z8673 Personal history of transient ischemic attack (TIA), and cerebral infarction without residual deficits: Secondary | ICD-10-CM

## 2011-11-12 DIAGNOSIS — F17200 Nicotine dependence, unspecified, uncomplicated: Secondary | ICD-10-CM | POA: Diagnosis present

## 2011-11-12 DIAGNOSIS — R945 Abnormal results of liver function studies: Secondary | ICD-10-CM

## 2011-11-12 DIAGNOSIS — C649 Malignant neoplasm of unspecified kidney, except renal pelvis: Secondary | ICD-10-CM | POA: Diagnosis present

## 2011-11-12 DIAGNOSIS — K219 Gastro-esophageal reflux disease without esophagitis: Secondary | ICD-10-CM | POA: Diagnosis present

## 2011-11-12 LAB — URINALYSIS, ROUTINE W REFLEX MICROSCOPIC
Hgb urine dipstick: NEGATIVE
Leukocytes, UA: NEGATIVE
Nitrite: NEGATIVE
Protein, ur: NEGATIVE mg/dL
Specific Gravity, Urine: 1.004 — ABNORMAL LOW (ref 1.005–1.030)
Urobilinogen, UA: 1 mg/dL (ref 0.0–1.0)

## 2011-11-12 LAB — CBC
MCV: 98.3 fL (ref 78.0–100.0)
Platelets: 263 10*3/uL (ref 150–400)
RBC: 3.56 MIL/uL — ABNORMAL LOW (ref 3.87–5.11)
RDW: 14 % (ref 11.5–15.5)
WBC: 8.4 10*3/uL (ref 4.0–10.5)

## 2011-11-12 LAB — COMPREHENSIVE METABOLIC PANEL
ALT: 93 U/L — ABNORMAL HIGH (ref 0–35)
AST: 62 U/L — ABNORMAL HIGH (ref 0–37)
Alkaline Phosphatase: 184 U/L — ABNORMAL HIGH (ref 39–117)
CO2: 29 mEq/L (ref 19–32)
Calcium: 8.8 mg/dL (ref 8.4–10.5)
Chloride: 99 mEq/L (ref 96–112)
GFR calc Af Amer: >90 mL/min (ref >90)
GFR calc non Af Amer: 87 mL/min — ABNORMAL LOW (ref >90)
Glucose, Bld: 98 mg/dL (ref 70–99)
Potassium: 3.5 mEq/L (ref 3.5–5.1)
Sodium: 137 mEq/L (ref 135–145)

## 2011-11-12 LAB — DIFFERENTIAL
Basophils Absolute: 0 10*3/uL (ref 0.0–0.1)
Eosinophils Relative: 1 % (ref 0–5)
Lymphocytes Relative: 41 % (ref 12–46)
Lymphs Abs: 3.5 10*3/uL (ref 0.7–4.0)
Neutro Abs: 4.1 10*3/uL (ref 1.7–7.7)
Neutrophils Relative %: 49 % (ref 43–77)

## 2011-11-12 MED ORDER — IOHEXOL 300 MG/ML  SOLN
100.0000 mL | Freq: Once | INTRAMUSCULAR | Status: AC | PRN
  Administered 2011-11-12: 100 mL via INTRAVENOUS

## 2011-11-12 NOTE — H&P (Signed)
[name] is an 66 y.o. female.   Chief Complaint: abdominal pain HPI: 66 year old African American female came to the ER because of a one-day history of right-sided abdominal pain. She states that she developed right-sided abdominal pain yesterday morning. It radiated to her side and back. She described the pain is constant and sharp. She states that it felt like she was being stuck with pins. He denies any prior symptoms. She states that she noticed her urine became a tangerine color which made her concern for possible recurrence of her renal cell cancer. She denies any fevers, chills, nausea, vomiting, diarrhea, melena, hematochezia, NSAID use. She took some oxycodone she had left over from her hernia surgery which did help with some of the discomfort. However it made her become drowsy. She underwent a left partial nephrectomy at [facility] in February 2012 for renal cell cancer. She underwent an open supraumbilical hernia repair in September at [facility]. She is unsure whether or not mesh was used. She denies any jaundice or weight loss. She states that she  is still having pain on her right side.  Past Medical History  Diagnosis Date  . Asthma   . Renal cell carcinoma 2012    left    Past Surgical History  Procedure Date  . Kidney surgery 12/2010    [facility]; partial nephrectomy  . Hernia repair 07/2011    supraumbilical repair  . Bunionectomy 04/2011  . Tubal ligation   . Abdominal hysterectomy   . Menisectomy     left knee    No family history on file. Social History:  reports that she has been smoking.  She does not have any smokeless tobacco history on file. She reports that she drinks alcohol. She reports that she does not use illicit drugs.  Allergies:  Allergies  Allergen Reactions  . Azithromycin Itching and Swelling    REACTION: unspecified    Medications Prior to Admission  Medication Dose Route Frequency Provider Last Rate Last Dose  . iohexol (OMNIPAQUE) 300 MG/ML solution  100 mL  100 mL Intravenous Once PRN Medication Radiologist   100 mL at 11/12/11 2046   No current outpatient prescriptions on file as of 11/12/2011.  I reviewed her outpt meds  Results for orders placed during the hospital encounter of 11/12/11 (from the past 48 hour(s))  URINALYSIS, ROUTINE W REFLEX MICROSCOPIC     Status: Abnormal   Collection Time   11/12/11  4:48 PM      Component Value Range Comment   Color, Urine YELLOW  YELLOW     APPearance CLEAR  CLEAR     Specific Gravity, Urine 1.004 (*) 1.005 - 1.030     pH 6.0  5.0 - 8.0     Glucose, UA NEGATIVE  NEGATIVE (mg/dL)    Hgb urine dipstick NEGATIVE  NEGATIVE     Bilirubin Urine NEGATIVE  NEGATIVE     Ketones, ur NEGATIVE  NEGATIVE (mg/dL)    Protein, ur NEGATIVE  NEGATIVE (mg/dL)    Urobilinogen, UA 1.0  0.0 - 1.0 (mg/dL)    Nitrite NEGATIVE  NEGATIVE     Leukocytes, UA NEGATIVE  NEGATIVE  MICROSCOPIC NOT DONE ON URINES WITH NEGATIVE PROTEIN, BLOOD, LEUKOCYTES, NITRITE, OR GLUCOSE <1000 mg/dL.  CBC     Status: Abnormal   Collection Time   11/12/11  7:42 PM      Component Value Range Comment   WBC 8.4  4.0 - 10.5 (K/uL)    RBC 3.56 (*)  3.87 - 5.11 (MIL/uL)    Hemoglobin 11.6 (*) 12.0 - 15.0 (g/dL)    HCT 16.1 (*) 09.6 - 46.0 (%)    MCV 98.3  78.0 - 100.0 (fL)    MCH 32.6  26.0 - 34.0 (pg)    MCHC 33.1  30.0 - 36.0 (g/dL)    RDW 04.5  40.9 - 81.1 (%)    Platelets 263  150 - 400 (K/uL)   DIFFERENTIAL     Status: Normal   Collection Time   11/12/11  7:42 PM      Component Value Range Comment   Neutrophils Relative 49  43 - 77 (%)    Neutro Abs 4.1  1.7 - 7.7 (K/uL)    Lymphocytes Relative 41  12 - 46 (%)    Lymphs Abs 3.5  0.7 - 4.0 (K/uL)    Monocytes Relative 9  3 - 12 (%)    Monocytes Absolute 0.7  0.1 - 1.0 (K/uL)    Eosinophils Relative 1  0 - 5 (%)    Eosinophils Absolute 0.1  0.0 - 0.7 (K/uL)    Basophils Relative 0  0 - 1 (%)    Basophils Absolute 0.0  0.0 - 0.1 (K/uL)   COMPREHENSIVE METABOLIC PANEL     Status:  Abnormal   Collection Time   11/12/11  7:42 PM      Component Value Range Comment   Sodium 137  135 - 145 (mEq/L)    Potassium 3.5  3.5 - 5.1 (mEq/L)    Chloride 99  96 - 112 (mEq/L)    CO2 29  19 - 32 (mEq/L)    Glucose, Bld 98  70 - 99 (mg/dL)    BUN 5 (*) 6 - 23 (mg/dL)    Creatinine, Ser 9.14  0.50 - 1.10 (mg/dL)    Calcium 8.8  8.4 - 10.5 (mg/dL)    Total Protein 7.1  6.0 - 8.3 (g/dL)    Albumin 3.4 (*) 3.5 - 5.2 (g/dL)    AST 62 (*) 0 - 37 (U/L)    ALT 93 (*) 0 - 35 (U/L)    Alkaline Phosphatase 184 (*) 39 - 117 (U/L)    Total Bilirubin 0.7  0.3 - 1.2 (mg/dL)    GFR calc non Af Amer 87 (*) >90 (mL/min)    GFR calc Af Amer >90  >90 (mL/min)   LIPASE, BLOOD     Status: Normal   Collection Time   11/12/11  7:42 PM      Component Value Range Comment   Lipase 26  11 - 59 (U/L)     RADIOLOGICAL STUDIES: I have personally reviewed the radiological exams myself  Ct Abdomen Pelvis W Contrast  11/12/2011  *RADIOLOGY REPORT*  Clinical Data: Abdominal pain.  Hematuria.  History of renal cell cancer with surgery to the left kidney.  CT ABDOMEN AND PELVIS WITH CONTRAST  Technique:  Multidetector CT imaging of the abdomen and pelvis was performed following the standard protocol during bolus administration of intravenous contrast.  Contrast: OMNIPAQUE IOHEXOL 300 MG/ML IV SOLN  Comparison: 07/05/2010  Findings: Minimal dependent changes in the lung bases.  Diffuse low attenuation change in the liver suggesting fatty infiltration. Cholelithiasis similar to the previous study but now demonstrating gallbladder wall thickening.  Mild infiltration around the tip of the gallbladder.  Changes are nonspecific but could be associated with cholecystitis in the appropriate clinical setting.  No focal liver lesions.  Spleen is not enlarged.  Segmental resection of the upper pole of the left kidney with interval resection of the previously demonstrated renal mass lesion.  No focal mass or hydronephrosis  demonstrated in either kidney.  Calcifications of the renal arteries and abdominal aorta.  No aneurysm.  The adrenal glands, pancreas, and retroperitoneal lymph nodes are unremarkable. The no free air or free fluid in the abdomen.  The stomach and small bowel are decompressed.  Stool filled colon is decompressed without obvious wall thickening.  Postsurgical changes in the midline abdominal wall.  Tiny residual fat containing midline abdominal wall hernia.  No bowel content.  Pelvis:  The bladder wall is diffusely thickened suggesting cystitis or hypertrophy.  No bladder stones visualized.  The uterus is atrophic or surgically absent.  No abnormal adnexal masses.  No significant pelvic lymphadenopathy.  No inflammatory changes in the sigmoid colon or right lower quadrant.  Appendix is normal.  Lipoma in the right obturator muscles.  Degenerative changes in the lumbar spine.  No destructive bone lesions visualized.  IMPRESSION: No evidence of residual, recurrent, or metastatic disease to the left kidney.  No renal stone, mass, or obstruction.  Diffuse fatty infiltration of the liver.  Minimal residual fat containing anterior abdominal wall hernia.  Cholelithiasis with gallbladder wall thickening and mild pericolic cystic stranding suggesting possibility of acute cholecystitis.  Diffuse bladder wall thickening suggesting hypertrophy or cystitis.  Original Report Authenticated By: Marlon Pel, M.D.    Review of Systems  Constitutional: Negative for fever, chills and diaphoresis.  HENT: Negative for sore throat.        Partial dentures  Eyes: Negative for blurred vision and double vision.  Respiratory: Negative for shortness of breath.        Has asthma, uses inhaler daily  Cardiovascular: Negative for chest pain, palpitations, orthopnea, leg swelling and PND.  Gastrointestinal: Positive for abdominal pain. Negative for nausea, vomiting, diarrhea and constipation.  Genitourinary: Negative for dysuria  and urgency.  Musculoskeletal:       +OA  Skin: Negative for itching and rash.  Neurological: Negative for dizziness, seizures, loss of consciousness, weakness and headaches.  Psychiatric/Behavioral: Negative.     Blood pressure 128/71, pulse 81, temperature 98.5 F (36.9 C), temperature source Oral, resp. rate 20, SpO2 98.00%. Physical Exam  Vitals reviewed. Constitutional: She is oriented to person, place, and time. She appears well-developed and well-nourished. No distress.  HENT:  Head: Normocephalic and atraumatic.  Nose: Nose normal.  Eyes: Conjunctivae are normal. No scleral icterus.  Neck: Normal range of motion. Neck supple. No tracheal deviation present. No thyromegaly present.  Cardiovascular: Normal rate, regular rhythm, normal heart sounds and intact distal pulses.   Respiratory: Effort normal and breath sounds normal. She has no wheezes.  GI: Soft. Bowel sounds are normal. There is tenderness in the right upper quadrant and epigastric area. There is positive Murphy's sign. There is no rigidity, no rebound and no guarding.         Multiple well healed abdominal incisions  Musculoskeletal: Normal range of motion. She exhibits no edema and no tenderness.  Lymphadenopathy:    She has no cervical adenopathy.  Neurological: She is alert and oriented to person, place, and time.  Skin: Skin is warm and dry. No rash noted. No erythema.  Psychiatric: She has a normal mood and affect. Her behavior is normal. Judgment and thought content normal.     Assessment/Plan #1-Right upper quadrant pain consistent with acute cholecystitis #2-asthma #3-anemia #4-history of renal cell carcinoma #  5-fatty liver infiltration #6-elevated liver function tests, probably secondary to #1 and #5 #7-tobacco use disorder #8-probable recurrent supraumbilical hernia  Her history, exam, and CT scan are consistent with acute cholecystitis. We discussed gallbladder disease. We discussed operative and  nonoperative management. I drew a diagram for the patient. I recommended admission to the hospital for IV antibiotics, IV fluids, and cholecystectomy. We briefly discussed laparoscopic cholecystectomy. I explained that her surgery should occur tomorrow.  Chrisanne Sella. Andrey Campanile, MD, FACS General, Bariatric, & Minimally Invasive Surgery [facility], [place]   [facility] M 11/12/2011, 11:00 PM

## 2011-11-12 NOTE — ED Notes (Signed)
Hernia in September. Pt reports yesterday AM her abdomen was sore and having red/orange colored urine with pain in her right mid back.  Pt reports pain is sharp and needle like that comes and goes.  Happens every hour and lasts few a few minutes. Pt has taken nothing for pain.  Pt also reports feeling itchy.  Pt has noted dry skin to back.

## 2011-11-12 NOTE — ED Notes (Signed)
Pt back from CT

## 2011-11-12 NOTE — ED Provider Notes (Signed)
History     CSN: [id number]  Arrival date & time 11/12/11  1628   First MD Initiated Contact with Patient 11/12/11 1903      Chief Complaint  Patient presents with  . Hematuria  . Abdominal Pain    (Consider location/radiation/quality/duration/timing/severity/associated sxs/prior treatment) HPI Developed abd pain last night and noted blood in urine.  Denies fever.  Hx of renal cell carcinoma and abdominal wall hernia.  Pain still present but improved.    Past Medical History  Diagnosis Date  . Asthma   . Renal cell carcinoma 2012    left    Past Surgical History  Procedure Date  . Kidney surgery 12/2010    [facility]; partial nephrectomy  . Hernia repair 07/2011    supraumbilical repair  . Bunionectomy 04/2011  . Tubal ligation   . Abdominal hysterectomy   . Menisectomy     left knee    No family history on file.  History  Substance Use Topics  . Smoking status: Current Some Day Smoker  . Smokeless tobacco: Not on file  . Alcohol Use: Yes     daily beer, has stopped without any withdrawal    OB History    Grav Para Term Preterm Abortions TAB SAB Ect Mult Living                  Review of Systems  All other systems reviewed and are negative.    Allergies  Azithromycin  Home Medications   No current outpatient prescriptions on file.  BP 106/61  Pulse 85  Temp(Src) 97.5 F (36.4 C) (Oral)  Resp 20  SpO2 99%  Physical Exam  Nursing note and vitals reviewed. Constitutional: She is oriented to person, place, and time. She appears well-developed and well-nourished. No distress.  HENT:  Head: Normocephalic and atraumatic.  Eyes: Pupils are equal, round, and reactive to light.  Neck: Normal range of motion.  Cardiovascular: Normal rate and intact distal pulses.   Pulmonary/Chest: Effort normal and breath sounds normal. No respiratory distress.  Abdominal: Soft. Normal appearance and bowel sounds are normal. She exhibits no distension and no mass. There  is tenderness. There is no rebound and no guarding.  Musculoskeletal: Normal range of motion.  Neurological: She is alert and oriented to person, place, and time. No cranial nerve deficit.  Skin: Skin is warm and dry. No rash noted.  Psychiatric: She has a normal mood and affect. Her behavior is normal.    ED Course  Procedures (including critical care time)  Labs Reviewed  URINALYSIS, ROUTINE W REFLEX MICROSCOPIC - Abnormal; Notable for the following:    Specific Gravity, Urine 1.004 (*)    All other components within normal limits  CBC - Abnormal; Notable for the following:    RBC 3.56 (*)    Hemoglobin 11.6 (*)    HCT 35.0 (*)    All other components within normal limits  COMPREHENSIVE METABOLIC PANEL - Abnormal; Notable for the following:    BUN 5 (*)    Albumin 3.4 (*)    AST 62 (*)    ALT 93 (*)    Alkaline Phosphatase 184 (*)    GFR calc non Af Amer 87 (*)    All other components within normal limits  COMPREHENSIVE METABOLIC PANEL - Abnormal; Notable for the following:    Potassium 3.0 (*)    BUN 4 (*)    Albumin 2.8 (*)    AST 46 (*)    ALT  74 (*)    Alkaline Phosphatase 152 (*)    All other components within normal limits  CBC - Abnormal; Notable for the following:    RBC 3.27 (*)    Hemoglobin 10.7 (*)    HCT 32.1 (*)    All other components within normal limits  SURGICAL PCR SCREEN - Abnormal; Notable for the following:    Staphylococcus aureus POSITIVE (*)    All other components within normal limits  DIFFERENTIAL  LIPASE, BLOOD  POCT CBG MONITORING  POCT CBG MONITORING  POCT CBG MONITORING  POCT CBG MONITORING   Ct Abdomen Pelvis W Contrast  11/12/2011  *RADIOLOGY REPORT*  Clinical Data: Abdominal pain.  Hematuria.  History of renal cell cancer with surgery to the left kidney.  CT ABDOMEN AND PELVIS WITH CONTRAST  Technique:  Multidetector CT imaging of the abdomen and pelvis was performed following the standard protocol during bolus administration of  intravenous contrast.  Contrast: OMNIPAQUE IOHEXOL 300 MG/ML IV SOLN  Comparison: 07/05/2010  Findings: Minimal dependent changes in the lung bases.  Diffuse low attenuation change in the liver suggesting fatty infiltration. Cholelithiasis similar to the previous study but now demonstrating gallbladder wall thickening.  Mild infiltration around the tip of the gallbladder.  Changes are nonspecific but could be associated with cholecystitis in the appropriate clinical setting.  No focal liver lesions.  Spleen is not enlarged.  Segmental resection of the upper pole of the left kidney with interval resection of the previously demonstrated renal mass lesion.  No focal mass or hydronephrosis demonstrated in either kidney.  Calcifications of the renal arteries and abdominal aorta.  No aneurysm.  The adrenal glands, pancreas, and retroperitoneal lymph nodes are unremarkable. The no free air or free fluid in the abdomen.  The stomach and small bowel are decompressed.  Stool filled colon is decompressed without obvious wall thickening.  Postsurgical changes in the midline abdominal wall.  Tiny residual fat containing midline abdominal wall hernia.  No bowel content.  Pelvis:  The bladder wall is diffusely thickened suggesting cystitis or hypertrophy.  No bladder stones visualized.  The uterus is atrophic or surgically absent.  No abnormal adnexal masses.  No significant pelvic lymphadenopathy.  No inflammatory changes in the sigmoid colon or right lower quadrant.  Appendix is normal.  Lipoma in the right obturator muscles.  Degenerative changes in the lumbar spine.  No destructive bone lesions visualized.  IMPRESSION: No evidence of residual, recurrent, or metastatic disease to the left kidney.  No renal stone, mass, or obstruction.  Diffuse fatty infiltration of the liver.  Minimal residual fat containing anterior abdominal wall hernia.  Cholelithiasis with gallbladder wall thickening and mild pericolic cystic  stranding suggesting possibility of acute cholecystitis.  Diffuse bladder wall thickening suggesting hypertrophy or cystitis.  Original Report Authenticated By: Marlon Pel, M.D.     1. Calculus of gallbladder with acute cholecystitis       MDM  General surg  Consulted and saw the pt in the ED        Nelia Shi, MD 11/13/11 1334

## 2011-11-12 NOTE — ED Notes (Signed)
Developed abd pain last night and noted blood in urine

## 2011-11-13 ENCOUNTER — Encounter (HOSPITAL_COMMUNITY)
Admission: EM | Disposition: A | Discharge status: Home / Self Care | Payer: Self-pay | Source: Home / Self Care | Attending: Emergency Medicine

## 2011-11-13 ENCOUNTER — Inpatient Hospital Stay (HOSPITAL_COMMUNITY): Payer: Medicaid Other

## 2011-11-13 ENCOUNTER — Other Ambulatory Visit (INDEPENDENT_AMBULATORY_CARE_PROVIDER_SITE_OTHER): Payer: Self-pay | Admitting: General Surgery

## 2011-11-13 ENCOUNTER — Encounter (HOSPITAL_COMMUNITY): Payer: Self-pay | Admitting: Anesthesiology

## 2011-11-13 ENCOUNTER — Inpatient Hospital Stay (HOSPITAL_COMMUNITY): Payer: Medicaid Other | Admitting: Anesthesiology

## 2011-11-13 DIAGNOSIS — C649 Malignant neoplasm of unspecified kidney, except renal pelvis: Secondary | ICD-10-CM | POA: Diagnosis present

## 2011-11-13 DIAGNOSIS — K8010 Calculus of gallbladder with chronic cholecystitis without obstruction: Secondary | ICD-10-CM

## 2011-11-13 DIAGNOSIS — G459 Transient cerebral ischemic attack, unspecified: Secondary | ICD-10-CM

## 2011-11-13 HISTORY — PX: CHOLECYSTECTOMY: SHX55

## 2011-11-13 LAB — COMPREHENSIVE METABOLIC PANEL
Alkaline Phosphatase: 152 U/L — ABNORMAL HIGH (ref 39–117)
BUN: 4 mg/dL — ABNORMAL LOW (ref 6–23)
CO2: 28 mEq/L (ref 19–32)
Chloride: 102 mEq/L (ref 96–112)
Creatinine, Ser: 0.6 mg/dL (ref 0.50–1.10)
GFR calc Af Amer: >90 mL/min (ref >90)
GFR calc non Af Amer: >90 mL/min (ref >90)
Glucose, Bld: 96 mg/dL (ref 70–99)
Potassium: 3 mEq/L — ABNORMAL LOW (ref 3.5–5.1)
Total Bilirubin: 0.7 mg/dL (ref 0.3–1.2)

## 2011-11-13 LAB — SURGICAL PCR SCREEN
MRSA, PCR: NEGATIVE
Staphylococcus aureus: POSITIVE — AB

## 2011-11-13 LAB — CBC
MCV: 98.2 fL (ref 78.0–100.0)
Platelets: 255 10*3/uL (ref 150–400)
RDW: 14.1 % (ref 11.5–15.5)
WBC: 6.7 10*3/uL (ref 4.0–10.5)

## 2011-11-13 SURGERY — LAPAROSCOPIC CHOLECYSTECTOMY WITH INTRAOPERATIVE CHOLANGIOGRAM
Anesthesia: General | Site: Abdomen | Wound class: Clean Contaminated

## 2011-11-13 MED ORDER — EZETIMIBE 10 MG PO TABS
10.0000 mg | ORAL_TABLET | Freq: Every day | ORAL | Status: DC
  Filled 2011-11-13: qty 1

## 2011-11-13 MED ORDER — ALBUTEROL SULFATE HFA 108 (90 BASE) MCG/ACT IN AERS
2.0000 | INHALATION_SPRAY | Freq: Four times a day (QID) | RESPIRATORY_TRACT | Status: DC | PRN
  Filled 2011-11-13: qty 6.7

## 2011-11-13 MED ORDER — LACTATED RINGERS IR SOLN
Status: DC | PRN
  Administered 2011-11-13: 1000 mL

## 2011-11-13 MED ORDER — ONDANSETRON HCL 4 MG/2ML IJ SOLN
INTRAMUSCULAR | Status: DC | PRN
  Administered 2011-11-13: 4 mg via INTRAVENOUS

## 2011-11-13 MED ORDER — MIDAZOLAM HCL 5 MG/5ML IJ SOLN
INTRAMUSCULAR | Status: DC | PRN
  Administered 2011-11-13: 2 mg via INTRAVENOUS

## 2011-11-13 MED ORDER — IOHEXOL 300 MG/ML  SOLN
INTRAMUSCULAR | Status: AC
  Filled 2011-11-13: qty 1

## 2011-11-13 MED ORDER — KCL IN DEXTROSE-NACL 20-5-0.45 MEQ/L-%-% IV SOLN
INTRAVENOUS | Status: DC
  Administered 2011-11-13: 1000 mL via INTRAVENOUS
  Filled 2011-11-13 (×4): qty 1000

## 2011-11-13 MED ORDER — MORPHINE SULFATE 2 MG/ML IJ SOLN
1.0000 mg | INTRAMUSCULAR | Status: DC | PRN
  Administered 2011-11-13: 2 mg via INTRAVENOUS
  Filled 2011-11-13: qty 1

## 2011-11-13 MED ORDER — LIDOCAINE-EPINEPHRINE (PF) 1 %-1:200000 IJ SOLN
INTRAMUSCULAR | Status: DC | PRN
  Administered 2011-11-13: 11:00:00 via SUBCUTANEOUS

## 2011-11-13 MED ORDER — LIDOCAINE-EPINEPHRINE 1 %-1:100000 IJ SOLN
INTRAMUSCULAR | Status: AC
  Filled 2011-11-13: qty 1

## 2011-11-13 MED ORDER — PIPERACILLIN-TAZOBACTAM 3.375 G IVPB
3.3750 g | Freq: Three times a day (TID) | INTRAVENOUS | Status: DC
  Administered 2011-11-13: 3.375 g via INTRAVENOUS
  Filled 2011-11-13 (×4): qty 50

## 2011-11-13 MED ORDER — SUCCINYLCHOLINE CHLORIDE 20 MG/ML IJ SOLN
INTRAMUSCULAR | Status: DC | PRN
  Administered 2011-11-13: 100 mg via INTRAVENOUS

## 2011-11-13 MED ORDER — FENTANYL CITRATE 0.05 MG/ML IJ SOLN
INTRAMUSCULAR | Status: DC | PRN
  Administered 2011-11-13 (×2): 50 ug via INTRAVENOUS

## 2011-11-13 MED ORDER — HYDROCODONE-ACETAMINOPHEN 5-325 MG PO TABS: 1.0000 | ORAL_TABLET | ORAL | Status: AC | PRN

## 2011-11-13 MED ORDER — CELECOXIB 200 MG PO CAPS: 200.0000 mg | ORAL_CAPSULE | Freq: Every day | ORAL | Status: DC

## 2011-11-13 MED ORDER — FAMOTIDINE 20 MG PO TABS
20.0000 mg | ORAL_TABLET | Freq: Two times a day (BID) | ORAL | Status: DC
  Administered 2011-11-13: 20 mg via ORAL
  Filled 2011-11-13 (×3): qty 1

## 2011-11-13 MED ORDER — BUPIVACAINE HCL (PF) 0.25 % IJ SOLN
INTRAMUSCULAR | Status: AC
  Filled 2011-11-13: qty 30

## 2011-11-13 MED ORDER — HEPARIN SODIUM (PORCINE) 5000 UNIT/ML IJ SOLN
5000.0000 [IU] | Freq: Once | INTRAMUSCULAR | Status: AC
  Administered 2011-11-13: 5000 [IU] via SUBCUTANEOUS
  Filled 2011-11-13: qty 1

## 2011-11-13 MED ORDER — FLUTICASONE-SALMETEROL 500-50 MCG/DOSE IN AEPB
1.0000 | INHALATION_SPRAY | Freq: Two times a day (BID) | RESPIRATORY_TRACT | Status: DC
Start: 2011-11-13 — End: 2011-11-13
  Filled 2011-11-13: qty 14

## 2011-11-13 MED ORDER — PROPOFOL 10 MG/ML IV BOLUS
INTRAVENOUS | Status: DC | PRN
  Administered 2011-11-13: 180 mg via INTRAVENOUS

## 2011-11-13 MED ORDER — DIPHENHYDRAMINE HCL 50 MG/ML IJ SOLN: 12.5000 mg | Freq: Four times a day (QID) | INTRAMUSCULAR | Status: DC | PRN

## 2011-11-13 MED ORDER — DEXAMETHASONE SODIUM PHOSPHATE 10 MG/ML IJ SOLN
INTRAMUSCULAR | Status: DC | PRN
  Administered 2011-11-13: 10 mg via INTRAVENOUS

## 2011-11-13 MED ORDER — ONDANSETRON HCL 4 MG/2ML IJ SOLN
4.0000 mg | Freq: Four times a day (QID) | INTRAMUSCULAR | Status: DC | PRN
  Administered 2011-11-13: 4 mg via INTRAVENOUS
  Filled 2011-11-13: qty 2

## 2011-11-13 MED ORDER — MONTELUKAST SODIUM 10 MG PO TABS
10.0000 mg | ORAL_TABLET | Freq: Every day | ORAL | Status: DC
  Filled 2011-11-13: qty 1

## 2011-11-13 MED ORDER — DIPHENHYDRAMINE HCL 12.5 MG/5ML PO ELIX: 12.5000 mg | ORAL_SOLUTION | Freq: Four times a day (QID) | ORAL | Status: DC | PRN

## 2011-11-13 MED ORDER — PIPERACILLIN-TAZOBACTAM 3.375 G IVPB 30 MIN
3.3750 g | Freq: Once | INTRAVENOUS | Status: AC
  Administered 2011-11-13: 3.375 g via INTRAVENOUS
  Filled 2011-11-13 (×2): qty 50

## 2011-11-13 MED ORDER — 0.9 % SODIUM CHLORIDE (POUR BTL) OPTIME
TOPICAL | Status: DC | PRN
  Administered 2011-11-13: 1000 mL

## 2011-11-13 MED ORDER — LIDOCAINE HCL (CARDIAC) 20 MG/ML IV SOLN
INTRAVENOUS | Status: DC | PRN
  Administered 2011-11-13: 50 mg via INTRAVENOUS

## 2011-11-13 MED ORDER — NICOTINE 14 MG/24HR TD PT24
14.0000 mg | MEDICATED_PATCH | Freq: Every day | TRANSDERMAL | Status: DC
  Filled 2011-11-13: qty 1

## 2011-11-13 MED ORDER — NICOTINE 14 MG/24HR TD PT24: 14.0000 | MEDICATED_PATCH | Freq: Every day | TRANSDERMAL | Status: DC

## 2011-11-13 MED ORDER — NEOSTIGMINE METHYLSULFATE 1 MG/ML IJ SOLN
INTRAMUSCULAR | Status: DC | PRN
  Administered 2011-11-13: 4 mg via INTRAVENOUS

## 2011-11-13 MED ORDER — HYDROCODONE-ACETAMINOPHEN 5-325 MG PO TABS
1.0000 | ORAL_TABLET | ORAL | Status: DC | PRN
  Administered 2011-11-13: 1 via ORAL
  Filled 2011-11-13: qty 1

## 2011-11-13 MED ORDER — LACTATED RINGERS IV SOLN: INTRAVENOUS | Status: DC

## 2011-11-13 MED ORDER — LACTATED RINGERS IV SOLN
INTRAVENOUS | Status: DC | PRN
  Administered 2011-11-13 (×2): via INTRAVENOUS

## 2011-11-13 MED ORDER — IOHEXOL 300 MG/ML  SOLN
INTRAMUSCULAR | Status: DC | PRN
  Administered 2011-11-13: 15 mL

## 2011-11-13 MED ORDER — ROCURONIUM BROMIDE 100 MG/10ML IV SOLN
INTRAVENOUS | Status: DC | PRN
  Administered 2011-11-13: 30 mg via INTRAVENOUS

## 2011-11-13 MED ORDER — GLYCOPYRROLATE 0.2 MG/ML IJ SOLN
INTRAMUSCULAR | Status: DC | PRN
  Administered 2011-11-13: .6 mg via INTRAVENOUS

## 2011-11-13 MED ORDER — PROMETHAZINE HCL 25 MG/ML IJ SOLN: 6.2500 mg | INTRAMUSCULAR | Status: DC | PRN

## 2011-11-13 MED ORDER — FENTANYL CITRATE 0.05 MG/ML IJ SOLN: 25.0000 ug | INTRAMUSCULAR | Status: DC | PRN

## 2011-11-13 SURGICAL SUPPLY — 51 items
ADH SKN CLS APL DERMABOND .7 (GAUZE/BANDAGES/DRESSINGS) ×2
APPLICATOR COTTON TIP 6IN STRL (MISCELLANEOUS) IMPLANT
APPLIER CLIP LOGIC TI 5 (MISCELLANEOUS) IMPLANT
APPLIER CLIP ROT 10 11.4 M/L (STAPLE) ×2
APR CLP MED LRG 11.4X10 (STAPLE) ×1
APR CLP MED LRG 33X5 (MISCELLANEOUS)
BAG SPEC RTRVL LRG 6X4 10 (ENDOMECHANICALS) ×1
CANISTER SUCTION 2500CC (MISCELLANEOUS) ×2 IMPLANT
CATH REDDICK CHOLANGI 4FR 50CM (CATHETERS) ×1 IMPLANT
CHLORAPREP W/TINT 26ML (MISCELLANEOUS) ×2 IMPLANT
CLIP APPLIE ROT 10 11.4 M/L (STAPLE) IMPLANT
CLOTH BEACON ORANGE TIMEOUT ST (SAFETY) ×2 IMPLANT
COVER MAYO STAND STRL (DRAPES) IMPLANT
COVER SURGICAL LIGHT HANDLE (MISCELLANEOUS) ×1 IMPLANT
DECANTER SPIKE VIAL GLASS SM (MISCELLANEOUS) ×2 IMPLANT
DERMABOND ADVANCED (GAUZE/BANDAGES/DRESSINGS) ×2
DERMABOND ADVANCED .7 DNX12 (GAUZE/BANDAGES/DRESSINGS) IMPLANT
DRAPE C-ARM 42X72 X-RAY (DRAPES) ×1 IMPLANT
DRAPE CAMERA CLOSED 9X96 (DRAPES) IMPLANT
DRAPE LAPAROSCOPIC ABDOMINAL (DRAPES) ×2 IMPLANT
DRAPE WARM FLUID 44X44 (DRAPE) ×1 IMPLANT
ELECT REM PT RETURN 9FT ADLT (ELECTROSURGICAL) ×2
ELECTRODE REM PT RTRN 9FT ADLT (ELECTROSURGICAL) ×1 IMPLANT
ENDOLOOP SUT PDS II  0 18 (SUTURE)
ENDOLOOP SUT PDS II 0 18 (SUTURE) IMPLANT
GLOVE BIOGEL PI IND STRL 7.0 (GLOVE) ×1 IMPLANT
GLOVE BIOGEL PI INDICATOR 7.0 (GLOVE) ×1
GLOVE ECLIPSE 7.0 STRL STRAW (GLOVE) ×2 IMPLANT
GLOVE SURG SS PI 7.5 STRL IVOR (GLOVE) ×4 IMPLANT
GOWN STRL NON-REIN LRG LVL3 (GOWN DISPOSABLE) ×2 IMPLANT
GOWN STRL REIN XL XLG (GOWN DISPOSABLE) ×4 IMPLANT
IV CATH 14GX2 1/4 (CATHETERS) ×1 IMPLANT
KIT BASIN OR (CUSTOM PROCEDURE TRAY) ×2 IMPLANT
NDL INSUFFLATION 14GA 120MM (NEEDLE) IMPLANT
NEEDLE INSUFFLATION 14GA 120MM (NEEDLE) IMPLANT
NS IRRIG 1000ML POUR BTL (IV SOLUTION) ×2 IMPLANT
POUCH SPECIMEN RETRIEVAL 10MM (ENDOMECHANICALS) ×2 IMPLANT
SCISSORS LAP 5X35 DISP (ENDOMECHANICALS) IMPLANT
SET CHOLANGIOGRAPH MIX (MISCELLANEOUS) ×1 IMPLANT
SET IRRIG TUBING LAPAROSCOPIC (IRRIGATION / IRRIGATOR) ×1 IMPLANT
SLEEVE Z-THREAD 5X100MM (TROCAR) ×3 IMPLANT
STRIP CLOSURE SKIN 1/2X4 (GAUZE/BANDAGES/DRESSINGS) IMPLANT
SUT MNCRL AB 4-0 PS2 18 (SUTURE) ×3 IMPLANT
SUT VICRYL 0 UR6 27IN ABS (SUTURE) ×1 IMPLANT
TOWEL OR 17X26 10 PK STRL BLUE (TOWEL DISPOSABLE) ×2 IMPLANT
TRAY LAP CHOLE (CUSTOM PROCEDURE TRAY) ×2 IMPLANT
TROCAR BALLN 12MMX100 BLUNT (TROCAR) ×2 IMPLANT
TROCAR XCEL BLUNT TIP 100MML (ENDOMECHANICALS) IMPLANT
TROCAR Z-THREAD FIOS 11X100 BL (TROCAR) ×2 IMPLANT
TROCAR Z-THREAD FIOS 5X100MM (TROCAR) ×2 IMPLANT
TUBING INSUFFLATION 10FT LAP (TUBING) ×2 IMPLANT

## 2011-11-13 NOTE — Progress Notes (Signed)
Pt stable, tolerating regular diet, d/c instructions and scripts given with no questions/concerns voiced.  Pt ambulated out with NT and husband to private vehicle.

## 2011-11-13 NOTE — Anesthesia Preprocedure Evaluation (Addendum)
Anesthesia Evaluation  Patient identified by MRN, date of birth, ID band Patient awake    Reviewed: Allergy & Precautions, H&P , NPO status , Patient's Chart, lab work & pertinent test results  Airway Mallampati: II TM Distance: >3 FB Neck ROM: full    Dental  (+) Dental Advisory Given Many teeth missing on sides and back but none in front:   Pulmonary asthma ,  Mod - severe asthma clear to auscultation  Pulmonary exam normal       Cardiovascular Exercise Tolerance: Good neg cardio ROS regular Normal    Neuro/Psych Negative Neurological ROS  Negative Psych ROS   GI/Hepatic negative GI ROS, Neg liver ROS, GERD-  Medicated and Controlled,  Endo/Other  Negative Endocrine ROS  Renal/GU negative Renal ROS  Genitourinary negative   Musculoskeletal   Abdominal   Peds  Hematology negative hematology ROS (+)   Anesthesia Other Findings   Reproductive/Obstetrics negative OB ROS                          Anesthesia Physical Anesthesia Plan  ASA: III  Anesthesia Plan: General   Post-op Pain Management:    Induction: Intravenous  Airway Management Planned: Oral ETT  Additional Equipment:   Intra-op Plan:   Post-operative Plan: Extubation in OR  Informed Consent: I have reviewed the patients History and Physical, chart, labs and discussed the procedure including the risks, benefits and alternatives for the proposed anesthesia with the patient or authorized representative who has indicated his/her understanding and acceptance.   Dental Advisory Given  Plan Discussed with: CRNA and Surgeon  Anesthesia Plan Comments:        Anesthesia Quick Evaluation

## 2011-11-13 NOTE — ED Notes (Signed)
Assigned nurse will transport patient to assigned room - 1609. Via stretcher

## 2011-11-13 NOTE — Discharge Summary (Signed)
Physician Discharge Summary  Patient ID: [name] MRN: [id number] DOB/AGE: [birth year] 64 y.o.  Admit date: 11/12/2011 Discharge date: 11/13/2011  Admission Diagnoses: Right upper quadrant pain with acute cholecystitis.  Discharge Diagnoses: Cholelithiasis Principal Problem:  *Calculus of gallbladder with acute cholecystitis Active Problems:  ASTHMA  GERD  Tobacco use disorder  Renal cell cancer  TIA (transient ischemic attack)  UTI   PROCEDURES: Laparoscopic cholecystectomy with intraoperative cholangiogram 11-13-11 Dr. Nash Mantis Course: Patient is a 66 year old [place] American female presented to the ER after one day of right-sided abdominal pain. She states that she developed pain yesterday morning prior to admission when to her back. It is constant sharp. She had some colored urine and was concerned that it was a recurrence of her renal cell cancer. She's had no fever chills nausea vomiting diarrhea melena or hematochezia. She was evaluated in the emergency room at [facility]. Alkaline phosphatase was slightly elevated at 184. WBC was normal at 8.4 hemoglobin 11.6 hematocrit 35, platelets were normal at 263,000. Urinalysis was normal. CT of the abdomen and pelvis with contrast showed no evidence of residual recurrent or metastatic disease left kidney. There is no renal stone mass or obstruction there is diffuse fatty infiltration the liver normal fat containing abdominal wall hernia. There is cholelithiasis with some gallbladder wall thickening and mild. Cholecystic stranding suggestive of acute cholecystitis. There is diffuse bladder thickening with a suggestion of hypertrophy or cystitis. She was seen by Dr. Gaynelle Adu admitted for cholecystitis she was placed on IV antibiotics. She was seen early this a.m. by Dr. Lodema Pilot, and taken the OR for laparoscopic cholecystectomy. Pathology is still pending. She was started on clear liquids and requested a soft diet,  and is insistent that she go home later today by 4 PM. It was Dr. Delice Lesch opinion she continues to do well and has no problem she can be discharged later this afternoon. She will go home on her preadmission medicines as before, except for the Celebrex. I requested not restart that for 48 hours to prevent bleeding issues. We will send her home with a prescription for dark Norco 5/325 one to 2 tablets every 4 hours as needed. Followup: She'll followup with Dr. Sela Hua at [facility] for her renal cell cancer. She currently has an appointment on February 9 to see him. Followup with Dr. Biagio Quint in 3 weeks. She has instructions call for followup appointment call if she has any problems in between . She has a laparoscopic cholecystectomy instruction. Condition on discharge: Improving.  Disposition: Home or Self Care Will [facility] physician assistant for Dr. Lodema Pilot.   Current Discharge Medication List    START taking these medications   Details  HYDROcodone-acetaminophen (NORCO) 5-325 MG per tablet Take 1 tablet by mouth every 4 (four) hours as needed (pain). Qty: 40 tablet, Refills: 0      CONTINUE these medications which have CHANGED   Details  celecoxib (CELEBREX) 200 MG capsule Take 1 capsule (200 mg total) by mouth daily.      CONTINUE these medications which have NOT CHANGED   Details  albuterol (PROVENTIL HFA;VENTOLIN HFA) 108 (90 BASE) MCG/ACT inhaler Inhale 2 puffs into the lungs every 6 (six) hours as needed. SHORTNESS OF BREATH/WHEEZING      aspirin EC 81 MG tablet Take 81 mg by mouth daily.      ezetimibe (ZETIA) 10 MG tablet Take 10 mg by mouth daily.      famotidine (  PEPCID) 20 MG tablet Take 20 mg by mouth 2 (two) times daily.      Fluticasone-Salmeterol (ADVAIR) 500-50 MCG/DOSE AEPB Inhale 1 puff into the lungs every 12 (twelve) hours.      levocetirizine (XYZAL) 5 MG tablet Take 5 mg by mouth daily.      montelukast (SINGULAIR) 10 MG tablet Take 10 mg  by mouth daily.      Multiple Vitamin (MULITIVITAMIN WITH MINERALS) TABS Take 1 tablet by mouth daily.      triamcinolone (NASACORT) 55 MCG/ACT nasal inhaler Place 2 sprays into the nose daily.         Follow-up Information    Follow up with Desarae Placide DAVID, DO. Make an appointment in 3 weeks. (Call for problems with incision,fever, increased abdominal pain.   as needed)    Contact information:   [address] [place] [address] [phone]          Signed: Sherrie George 11/13/2011, 2:14 PM

## 2011-11-13 NOTE — Anesthesia Procedure Notes (Signed)
Procedure Name: Intubation Date/Time: 11/13/2011 9:46 AM Performed by: Joycie Peek Pre-anesthesia Checklist: Patient identified, Emergency Drugs available, Suction available, Patient being monitored and Timeout performed Patient Re-evaluated:Patient Re-evaluated prior to inductionOxygen Delivery Method: Circle System Utilized Preoxygenation: Pre-oxygenation with 100% oxygen Intubation Type: IV induction Ventilation: Mask ventilation without difficulty Grade View: Grade I Tube type: Oral Tube size: 7.5 mm Number of attempts: 1 Airway Equipment and Method: stylet Placement Confirmation: ETT inserted through vocal cords under direct vision,  breath sounds checked- equal and bilateral and positive ETCO2 Secured at: 22 cm Tube secured with: Tape Dental Injury: Teeth and Oropharynx as per pre-operative assessment

## 2011-11-13 NOTE — Brief Op Note (Signed)
11/12/2011 - 11/13/2011  11:54 AM  PATIENT:  [name]  66 y.o. female  PRE-OPERATIVE DIAGNOSIS:  gallstones  POST-OPERATIVE DIAGNOSIS:  gallstones  PROCEDURE:  Procedure(s): LAPAROSCOPIC CHOLECYSTECTOMY WITH INTRAOPERATIVE CHOLANGIOGRAM  SURGEON:  Surgeon(s): Rulon Abide, DO  PHYSICIAN ASSISTANT:   ASSISTANTS: none   ANESTHESIA:   general  EBL:  Total I/O In: 1000 [I.V.:1000] Out: 10 [Blood:10]  BLOOD ADMINISTERED:none  DRAINS: none   LOCAL MEDICATIONS USED:  MARCAINE 15CC and LIDOCAINE 15CC  SPECIMEN:  Source of Specimen:  gallbladder  DISPOSITION OF SPECIMEN:  PATHOLOGY  COUNTS:  YES  TOURNIQUET:  * No tourniquets in log *  DICTATION: .Other Dictation: Dictation Number [id number]  PLAN OF CARE: Discharge to home after PACU  PATIENT DISPOSITION:  PACU - hemodynamically stable.   Delay start of Pharmacological VTE agent (>24hrs) due to surgical blood loss or risk of bleeding:  {YES/NO/NOT APPLICABLE:20182

## 2011-11-13 NOTE — Anesthesia Postprocedure Evaluation (Signed)
  Anesthesia Post-op Note  Patient: [name]  Procedure(s) Performed:  LAPAROSCOPIC CHOLECYSTECTOMY WITH INTRAOPERATIVE CHOLANGIOGRAM  Patient Location: PACU  Anesthesia Type: General  Level of Consciousness: awake and alert   Airway and Oxygen Therapy: Patient Spontanous Breathing  Post-op Pain: mild  Post-op Assessment: Post-op Vital signs reviewed, Patient's Cardiovascular Status Stable, Respiratory Function Stable, Patent Airway and No signs of Nausea or vomiting  Post-op Vital Signs: stable  Complications: No apparent anesthesia complications

## 2011-11-13 NOTE — Progress Notes (Signed)
Pt tolerating clear liquid diet well.  Pt advanced to regular diet at pt request.

## 2011-11-13 NOTE — Transfer of Care (Signed)
Immediate Anesthesia Transfer of Care Note  Patient: [name]  Procedure(s) Performed:  LAPAROSCOPIC CHOLECYSTECTOMY WITH INTRAOPERATIVE CHOLANGIOGRAM  Patient Location: PACU  Anesthesia Type: General  Level of Consciousness: awake and sedated  Airway & Oxygen Therapy: Patient Spontanous Breathing and Patient connected to face mask oxygen  Post-op Assessment: Report given to PACU RN and Post -op Vital signs reviewed and stable  Post vital signs: Reviewed and stable  Complications: No apparent anesthesia complications

## 2011-11-13 NOTE — Progress Notes (Signed)
Subjective: Still "sore" but improved.  Objective: Vital signs in last 24 hours: Temp:  [98 F (36.7 C)-98.8 F (37.1 C)] 98.3 F (36.8 C) (01/02 0741) Pulse Rate:  [67-101] 88  (01/02 0741) Resp:  [16-20] 16  (01/02 0741) BP: (98-135)/(51-85) 110/65 mmHg (01/02 0741) SpO2:  [90 %-100 %] 96 % (01/02 0741)    Intake/Output from previous day: 01/01 0701 - 01/02 0700 In: 375 [I.V.:375] Out: -  Intake/Output this shift:    General appearance: alert, cooperative and no distress GI: soft, mild upper abdominal and right flank tenderness, nd, no peritoneal signs, no evidence of recurrent hernia on exam.  Lab Results:   [facility] 11/13/11 0422 11/12/11 1942  WBC 6.7 8.4  HGB 10.7* 11.6*  HCT 32.1* 35.0*  PLT 255 263   BMET  Basename 11/13/11 0422 11/12/11 1942  NA 139 137  K 3.0* 3.5  CL 102 99  CO2 28 29  GLUCOSE 96 98  BUN 4* 5*  CREATININE 0.60 0.79  CALCIUM 8.5 8.8   PT/INR No results found for this basename: LABPROT:2,INR:2 in the last 72 hours ABG No results found for this basename: PHART:2,PCO2:2,PO2:2,HCO3:2 in the last 72 hours  Studies/Results: Ct Abdomen Pelvis W Contrast  11/12/2011  *RADIOLOGY REPORT*  Clinical Data: Abdominal pain.  Hematuria.  History of renal cell cancer with surgery to the left kidney.  CT ABDOMEN AND PELVIS WITH CONTRAST  Technique:  Multidetector CT imaging of the abdomen and pelvis was performed following the standard protocol during bolus administration of intravenous contrast.  Contrast: OMNIPAQUE IOHEXOL 300 MG/ML IV SOLN  Comparison: 07/05/2010  Findings: Minimal dependent changes in the lung bases.  Diffuse low attenuation change in the liver suggesting fatty infiltration. Cholelithiasis similar to the previous study but now demonstrating gallbladder wall thickening.  Mild infiltration around the tip of the gallbladder.  Changes are nonspecific but could be associated with cholecystitis in the appropriate clinical setting.   No focal liver lesions.  Spleen is not enlarged.  Segmental resection of the upper pole of the left kidney with interval resection of the previously demonstrated renal mass lesion.  No focal mass or hydronephrosis demonstrated in either kidney.  Calcifications of the renal arteries and abdominal aorta.  No aneurysm.  The adrenal glands, pancreas, and retroperitoneal lymph nodes are unremarkable. The no free air or free fluid in the abdomen.  The stomach and small bowel are decompressed.  Stool filled colon is decompressed without obvious wall thickening.  Postsurgical changes in the midline abdominal wall.  Tiny residual fat containing midline abdominal wall hernia.  No bowel content.  Pelvis:  The bladder wall is diffusely thickened suggesting cystitis or hypertrophy.  No bladder stones visualized.  The uterus is atrophic or surgically absent.  No abnormal adnexal masses.  No significant pelvic lymphadenopathy.  No inflammatory changes in the sigmoid colon or right lower quadrant.  Appendix is normal.  Lipoma in the right obturator muscles.  Degenerative changes in the lumbar spine.  No destructive bone lesions visualized.  IMPRESSION: No evidence of residual, recurrent, or metastatic disease to the left kidney.  No renal stone, mass, or obstruction.  Diffuse fatty infiltration of the liver.  Minimal residual fat containing anterior abdominal wall hernia.  Cholelithiasis with gallbladder wall thickening and mild pericolic cystic stranding suggesting possibility of acute cholecystitis.  Diffuse bladder wall thickening suggesting hypertrophy or cystitis.  Original Report Authenticated By: Marlon Pel, M.D.    Anti-infectives: Anti-infectives     Start  Dose/Rate Route Frequency Ordered Stop   11/13/11 0800   piperacillin-tazobactam (ZOSYN) IVPB 3.375 g        3.375 g 12.5 mL/hr over 240 Minutes Intravenous 3 times per day 11/13/11 0221     11/13/11 0230   piperacillin-tazobactam (ZOSYN) IVPB 3.375  g        3.375 g 100 mL/hr over 30 Minutes Intravenous  Once 11/13/11 0223 11/13/11 0500          Assessment/Plan: s/p  I discussed with her the findings of GB thickening, fluid, and gallstones, and elevated LFT's, given her hx of persistent upper abdominal pain, I think that it is reasonable to proceed with lap chole/IOC for further evaluation and treatment.  I discussed with her the risks of infection, bleeding, pain, scarring, persistent symptoms,need for open surgery, injury to bowel or bile ducts, and diarrhea and she expressed understanding and desires to proceed with lap chole with IOC.  LOS: 1 day    Lodema Pilot DAVID 11/13/2011

## 2011-11-13 NOTE — ED Notes (Signed)
Pt brought to RM 30 in TCU from main ED. Pt awaiting surgery. Pt reports surgeon told her that he would be taking her to surgery sometime early this morning. Pt requesting to use telephone to notify her daughter. Phone given to pt.

## 2011-11-13 NOTE — ED Notes (Signed)
Patient is resting comfortably. 

## 2011-11-14 ENCOUNTER — Encounter (HOSPITAL_COMMUNITY): Payer: Self-pay | Admitting: General Surgery

## 2011-11-14 NOTE — Op Note (Signed)
[name], [name] NO.:  [id number]  MEDICAL RECORD NO.:  [id number]  LOCATION:  1609                         FACILITY:  [facility]  PHYSICIAN:  Lodema Pilot, MD       DATE OF BIRTH:  May 23, 1946  DATE OF PROCEDURE:  11/13/2011 DATE OF DISCHARGE:  11/13/2011                              OPERATIVE REPORT   PROCEDURE:  Laparoscopic cholecystectomy with intraoperative cholangiogram.  PREOPERATIVE DIAGNOSIS:  Acute cholecystitis.  POSTOPERATIVE DIAGNOSIS:  Symptomatic cholelithiasis.  SURGEON:  Lodema Pilot, MD  ASSISTANT:  None.  ANESTHESIA:  General endotracheal tube anesthesia with 29 cc of 1% lidocaine with epinephrine, 0.25% Marcaine in a 50:50 mixture.  FLUIDS:  1 L of crystalloid.  ESTIMATED BLOOD LOSS:  Minimal.  DRAINS:  None.  SPECIMENS:  Gallbladder and contents sent to Pathology for permanent sectioning.  FINDINGS:  Very large and dilated gallbladder.  Normal cholangiogram and no evidence of acute cholecystitis.  COMPLICATIONS:  None apparent.  INDICATION OF PROCEDURE:  [name] is a 66 year old female with a 2-week history of right upper quadrant and right flank abdominal pain who presented to the emergency room yesterday with elevated LFTs.  CT scan demonstrated multiple gallstones and thickened gallbladder wall and apparent fluid around the gallbladder, concerning for acute cholecystitis.  OPERATIVE DETAILS:  [name] was seen and evaluated in the surgical ward and risks and benefits of procedure were discussed in lay terms. Informed consent was obtained.  I discussed with her the risks of infection, bleeding, pain, scarring, persistent symptoms, injury to bowel or bile duct, diarrhea, the need for open surgery.  She expressed understanding and desired to proceed with cholecystectomy.  She was taken to the operating room and placed on the table in a supine position.  General endotracheal tube anesthesia was obtained. Prophylactic  antibiotics were given.  Her abdomen was prepped and draped in the standard surgical fashion.  Procedure time-out was performed with all operative team members to confirm proper patient and procedure. Then, an infraumbilical midline incision was made in the skin and dissection carried down to the abdominal wall fascia using blunt dissection.  Fascia was elevated and sharply incised and the peritoneum entered, under direct visualization, I swept my finger in the area and she had some adhesions nearby that appeared clear enough to place a trocar.  A 12 mm balloon trocar was placed and pneumoperitoneum was obtained.  Laparoscope was placed and the underlying contents were inspected for injury upon entry and none was identified.  She did have adhesions to the lower midline just inferior to the entry site from her other surgery as well as some midline adhesions just superior to the entry site as well, but there was no bowel adhered to the abdominal wall.  She did not appear to have any obvious hernia defect, but the adhesions were not taken down.  An 11 mm epigastric trocar was placed and two 5 mm right upper quadrant trocars were placed under direct visualization.  The gallbladder was dilated and distended, but did not appear thickened.  There was some fluid in the periportal region, but no significant inflammatory changes.  Gallbladder was  retracted cephalad and peritoneum was taken down using blunt dissection.  The cystic artery was seen coursing up on the gallbladder on the medial side in its usual anatomic position and this was skeletonized and divided between hemoclips.  Cystic duct was skeletonized.  A critical view of safety was obtained, visualizing a single cystic duct entering the gallbladder and liver parenchyma through the triangle of Calot.  Clip was placed on the gallbladder side and cystic ductotomy was made.  The cholangiogram catheter was placed into the cystic duct and  cholangiogram was performed, which demonstrated a long cystic duct and free flow of bile into the duodenum, filling of the right and left hepatic ducts without any filling defects in the common bile duct.  The catheter was removed and 3 clips were placed on the cystic duct and additional clips were placed on the gallbladder to prevent leakage of bile.  There was a little bit of bile which spilled from the cholangiogram, ductotomy site and there was some sludge as well that was seen after the duct was transected after it was clipped.  Then, the gallbladder was removed from the gallbladder fossa using Bovie electrocautery and a few clips were placed high on the gallbladder for some small venous branches that were entering the gallbladder.  The gallbladder was completely removed from the gallbladder fossa and placed in an EndoCatch bag.  Gallbladder fossa was inspected for hemostasis, which was noted to be adequate, and the clips appeared to be in good position without any evidence of bowel injury or bile leakage or bleeding.  The right upper quadrant was irrigated with sterile saline solution until irrigation returned clear and this was suctioned from the abdomen.  The gallbladder was removed from the umbilical trocar site in an EndoCatch bag and passed off the table and sent to Pathology for permanent sectioning.  Then, the right upper quadrant was again inspected and noted to be hemostatic.  The 5 mm trocars were removed under direct visualization and abdominal wall was noted be hemostatic.  The umbilical fascia was approximated with interrupted 0-Vicryl sutures.  Prior to securing these sutures, the abdomen was reinsufflated and laparoscope introduced through the epigastric trocar site.  There was no evidence of bowel injury upon closure.  Fascia was well approximated and sutures were secured.  The wounds were injected with a total of 29 cc of 1% lidocaine with epinephrine and 0.25%  Marcaine in a 50:50 mixture.  Skin edges were approximated with 4-0 Monocryl subcuticular suture.  The skin was washed and dried and Dermabond was applied.  All sponge, needle, instrument were counts correct at the end of the case.  The patient tolerated the procedure well without apparent complications.          ______________________________ Lodema Pilot, MD     BL/MEDQ  D:  11/13/2011  T:  11/14/2011  Job:  [id number]

## 2011-11-18 ENCOUNTER — Telehealth (INDEPENDENT_AMBULATORY_CARE_PROVIDER_SITE_OTHER): Payer: Self-pay | Admitting: General Surgery

## 2011-11-18 NOTE — Telephone Encounter (Signed)
This was sent to me for a po with Dr Andrey Campanile. After reviewing this is a patient of Dr Biagio Quint. Appt made 12/05/11 @ 2:15pm.

## 2011-11-19 ENCOUNTER — Other Ambulatory Visit (HOSPITAL_COMMUNITY): Payer: Self-pay | Admitting: Family Medicine

## 2011-11-19 DIAGNOSIS — Z1231 Encounter for screening mammogram for malignant neoplasm of breast: Secondary | ICD-10-CM

## 2011-12-05 ENCOUNTER — Ambulatory Visit (INDEPENDENT_AMBULATORY_CARE_PROVIDER_SITE_OTHER): Payer: Medicaid Other | Admitting: General Surgery

## 2011-12-05 ENCOUNTER — Encounter (INDEPENDENT_AMBULATORY_CARE_PROVIDER_SITE_OTHER): Payer: Self-pay | Admitting: General Surgery

## 2011-12-05 VITALS — BP 138/92 | HR 72 | Temp 97.6°F | Resp 16 | Ht 68.0 in | Wt 176.2 lb

## 2011-12-05 DIAGNOSIS — Z5189 Encounter for other specified aftercare: Secondary | ICD-10-CM

## 2011-12-05 DIAGNOSIS — Z4889 Encounter for other specified surgical aftercare: Secondary | ICD-10-CM

## 2011-12-05 NOTE — Progress Notes (Signed)
Subjective:     Patient ID: [name], female   DOB: 1946-10-09, 66 y.o.   MRN: [id number]  HPI This patient follows up in approximately one month status post a cholecystectomy for acute cholecystitis. She states that she had some nausea and vomiting for approximately 2 weeks after surgery but this has resolved over the last week and she hasn't had issues with this since. She denies any pain or fevers or chills her pathology was benign. She states that her bowels are normal.  Review of Systems     Objective:   Physical Exam No distress and nontoxic-appearing her incisions are healing well without sign of infection.    Assessment:     Status post left cholecystectomy-doing well. I think that she seems to have improved as compared to preoperatively. She has been feeling well over the last week. I think that some of her symptoms are normal for postoperative state and they should continue to improve with time. I see no evidence of any postoperative complications.    Plan:     F/u prn surgery.  Activity and diet as tolerated.

## 2011-12-17 ENCOUNTER — Ambulatory Visit (HOSPITAL_COMMUNITY): Payer: Medicaid Other

## 2011-12-19 ENCOUNTER — Emergency Department (HOSPITAL_COMMUNITY): Payer: Self-pay

## 2011-12-19 ENCOUNTER — Emergency Department (HOSPITAL_COMMUNITY)
Admission: EM | Admit: 2011-12-19 | Discharge: 2011-12-19 | Disposition: A | Discharge status: Home / Self Care | Payer: Self-pay | Attending: Emergency Medicine | Admitting: Emergency Medicine

## 2011-12-19 ENCOUNTER — Encounter (HOSPITAL_COMMUNITY): Payer: Self-pay | Admitting: Emergency Medicine

## 2011-12-19 DIAGNOSIS — R197 Diarrhea, unspecified: Secondary | ICD-10-CM | POA: Insufficient documentation

## 2011-12-19 DIAGNOSIS — J45909 Unspecified asthma, uncomplicated: Secondary | ICD-10-CM | POA: Insufficient documentation

## 2011-12-19 DIAGNOSIS — Z7982 Long term (current) use of aspirin: Secondary | ICD-10-CM | POA: Insufficient documentation

## 2011-12-19 DIAGNOSIS — R112 Nausea with vomiting, unspecified: Secondary | ICD-10-CM | POA: Insufficient documentation

## 2011-12-19 DIAGNOSIS — R10812 Left upper quadrant abdominal tenderness: Secondary | ICD-10-CM | POA: Insufficient documentation

## 2011-12-19 DIAGNOSIS — Z79899 Other long term (current) drug therapy: Secondary | ICD-10-CM | POA: Insufficient documentation

## 2011-12-19 DIAGNOSIS — F17200 Nicotine dependence, unspecified, uncomplicated: Secondary | ICD-10-CM | POA: Insufficient documentation

## 2011-12-19 DIAGNOSIS — R1012 Left upper quadrant pain: Secondary | ICD-10-CM | POA: Insufficient documentation

## 2011-12-19 DIAGNOSIS — Z9889 Other specified postprocedural states: Secondary | ICD-10-CM | POA: Insufficient documentation

## 2011-12-19 DIAGNOSIS — R6883 Chills (without fever): Secondary | ICD-10-CM | POA: Insufficient documentation

## 2011-12-19 LAB — URINALYSIS, ROUTINE W REFLEX MICROSCOPIC
Bilirubin Urine: NEGATIVE
Ketones, ur: NEGATIVE mg/dL
Leukocytes, UA: NEGATIVE
Nitrite: NEGATIVE
Protein, ur: NEGATIVE mg/dL

## 2011-12-19 LAB — COMPREHENSIVE METABOLIC PANEL
Alkaline Phosphatase: 149 U/L — ABNORMAL HIGH (ref 39–117)
BUN: 22 mg/dL (ref 6–23)
CO2: 29 mEq/L (ref 19–32)
Chloride: 103 mEq/L (ref 96–112)
Creatinine, Ser: 0.66 mg/dL (ref 0.50–1.10)
GFR calc Af Amer: >90 mL/min (ref >90)
GFR calc non Af Amer: >90 mL/min (ref >90)
Glucose, Bld: 113 mg/dL — ABNORMAL HIGH (ref 70–99)
Total Bilirubin: 0.3 mg/dL (ref 0.3–1.2)

## 2011-12-19 LAB — CBC
HCT: 39.4 % (ref 36.0–46.0)
Hemoglobin: 13.6 g/dL (ref 12.0–15.0)
MCHC: 34.5 g/dL (ref 30.0–36.0)
RBC: 4.08 MIL/uL (ref 3.87–5.11)

## 2011-12-19 LAB — DIFFERENTIAL
Basophils Relative: 0 % (ref 0–1)
Lymphocytes Relative: 17 % (ref 12–46)
Lymphs Abs: 1.2 10*3/uL (ref 0.7–4.0)
Monocytes Absolute: 0.3 10*3/uL (ref 0.1–1.0)
Monocytes Relative: 4 % (ref 3–12)
Neutro Abs: 5.6 10*3/uL (ref 1.7–7.7)

## 2011-12-19 LAB — LIPASE, BLOOD: Lipase: 25 U/L (ref 11–59)

## 2011-12-19 MED ORDER — ONDANSETRON HCL 4 MG/2ML IJ SOLN
4.0000 mg | Freq: Once | INTRAMUSCULAR | Status: AC
  Administered 2011-12-19: 4 mg via INTRAVENOUS
  Filled 2011-12-19: qty 2

## 2011-12-19 MED ORDER — HYDROCODONE-ACETAMINOPHEN 5-500 MG PO TABS: 1.0000 | ORAL_TABLET | Freq: Four times a day (QID) | ORAL | Status: AC | PRN

## 2011-12-19 MED ORDER — HYDROMORPHONE HCL PF 1 MG/ML IJ SOLN
0.5000 mg | Freq: Once | INTRAMUSCULAR | Status: AC
  Administered 2011-12-19: 0.5 mg via INTRAVENOUS
  Filled 2011-12-19: qty 1

## 2011-12-19 MED ORDER — ONDANSETRON 8 MG PO TBDP: 8.0000 mg | ORAL_TABLET | Freq: Three times a day (TID) | ORAL | Status: AC | PRN

## 2011-12-19 MED ORDER — IOHEXOL 300 MG/ML  SOLN
100.0000 mL | Freq: Once | INTRAMUSCULAR | Status: AC | PRN
  Administered 2011-12-19: 100 mL via INTRAVENOUS

## 2011-12-19 MED ORDER — SODIUM CHLORIDE 0.9 % IV SOLN
Freq: Once | INTRAVENOUS | Status: AC
  Administered 2011-12-19: 17:00:00 via INTRAVENOUS

## 2011-12-19 MED ORDER — HYDROMORPHONE HCL PF 1 MG/ML IJ SOLN
1.0000 mg | Freq: Once | INTRAMUSCULAR | Status: AC
  Administered 2011-12-19: 1 mg via INTRAVENOUS
  Filled 2011-12-19: qty 1

## 2011-12-19 MED ORDER — IOHEXOL 300 MG/ML  SOLN: 20.0000 mL | INTRAMUSCULAR | Status: AC

## 2011-12-19 NOTE — ED Provider Notes (Signed)
Medical screening examination/treatment/procedure(s) were performed by non-physician practitioner and as supervising physician I was immediately available for consultation/collaboration.  I have discussed the patient's presentation, her prior surgical history, CT results.  The patient was discharged in stable condition.  Gerhard Munch, MD 12/19/11 2342

## 2011-12-19 NOTE — ED Provider Notes (Signed)
History     CSN: [id number]  Arrival date & time 12/19/11  1510   First MD Initiated Contact with Patient 12/19/11 1553      Chief Complaint  Patient presents with  . Abdominal Pain  . Emesis    (Consider location/radiation/quality/duration/timing/severity/associated sxs/prior treatment) Patient is a 66 y.o. female presenting with abdominal pain. The history is provided by the patient.  Abdominal Pain The primary symptoms of the illness include abdominal pain, nausea, vomiting and diarrhea. The primary symptoms of the illness do not include fever, shortness of breath, hematemesis, dysuria, vaginal discharge or vaginal bleeding.  Additional symptoms associated with the illness include chills.  Pt states she woke up this morning with sharp pain to the left upper abdomen, nausea, vomiting, diarrhea. States had cholecystectomy about 2 months ago, pain similar to that. Also has had left kidney surgery for a "mass resection" about a year ago and states they had to remove one of her ribs on that side. Pt denies fever, states having chills. Unable to keep anything down, including any PO medications. Denies urinary or vaginal symptoms. Denies chest pain or shortness of breath.   Past Medical History  Diagnosis Date  . Asthma   . Renal cell carcinoma 2012    left    Past Surgical History  Procedure Date  . Kidney surgery 12/2010    [facility]; partial nephrectomy  . Hernia repair 07/2011    supraumbilical repair  . Bunionectomy 04/2011  . Tubal ligation   . Abdominal hysterectomy   . Menisectomy     left knee  . Cholecystectomy 11/13/2011    Procedure: LAPAROSCOPIC CHOLECYSTECTOMY WITH INTRAOPERATIVE CHOLANGIOGRAM;  Surgeon: Rulon Abide, DO;  Location: [facility];  Service: General;  Laterality: N/A;    History reviewed. No pertinent family history.  History  Substance Use Topics  . Smoking status: Current Some Day Smoker  . Smokeless tobacco: Never Used  . Alcohol Use: Yes     daily  beer, has stopped without any withdrawal    OB History    Grav Para Term Preterm Abortions TAB SAB Ect Mult Living                  Review of Systems  Constitutional: Positive for chills. Negative for fever.  HENT: Negative.   Respiratory: Negative for cough, chest tightness and shortness of breath.   Cardiovascular: Negative.   Gastrointestinal: Positive for nausea, vomiting, abdominal pain and diarrhea. Negative for abdominal distention and hematemesis.  Genitourinary: Negative for dysuria, flank pain, vaginal bleeding, vaginal discharge and pelvic pain.  Musculoskeletal: Negative.   Skin: Negative.   Neurological: Negative.   Psychiatric/Behavioral: Negative.     Allergies  Azithromycin  Home Medications   Current Outpatient Rx  Name Route Sig Dispense Refill  . ALBUTEROL SULFATE HFA 108 (90 BASE) MCG/ACT IN AERS Inhalation Inhale 2 puffs into the lungs every 6 (six) hours as needed. SHORTNESS OF BREATH/WHEEZING      . ASPIRIN EC 81 MG PO TBEC Oral Take 81 mg by mouth daily.      . CELECOXIB 200 MG PO CAPS Oral Take 200 mg by mouth daily.    Marland Kitchen EZETIMIBE 10 MG PO TABS Oral Take 10 mg by mouth daily.      Marland Kitchen FAMOTIDINE 20 MG PO TABS Oral Take 20 mg by mouth 2 (two) times daily.      Marland Kitchen FLUTICASONE-SALMETEROL 500-50 MCG/DOSE IN AEPB Inhalation Inhale 1 puff into the lungs every 12 (twelve)  hours.     Marland Kitchen HYDROXYZINE HCL 25 MG PO TABS Oral Take 25 mg by mouth 3 (three) times daily as needed. For itching    . LEVOCETIRIZINE DIHYDROCHLORIDE 5 MG PO TABS Oral Take 5 mg by mouth daily.      Marland Kitchen MONTELUKAST SODIUM 10 MG PO TABS Oral Take 10 mg by mouth daily.      . ADULT MULTIVITAMIN W/MINERALS CH Oral Take 1 tablet by mouth daily.      . TRIAMCINOLONE ACETONIDE 55 MCG/ACT NA INHA Nasal Place 2 sprays into the nose daily.        BP 151/71  Pulse 82  Temp(Src) 98.6 F (37 C) (Oral)  Resp 22  SpO2 97%  Physical Exam  Nursing note and vitals reviewed. Constitutional: She is  oriented to person, place, and time. She appears well-developed and well-nourished.       Uncomfortable appearing, tearful  HENT:  Head: Normocephalic and atraumatic.  Eyes: Conjunctivae are normal.  Neck: Normal range of motion. Neck supple.  Cardiovascular: Normal rate, regular rhythm and normal heart sounds.   Pulmonary/Chest: Effort normal and breath sounds normal. No respiratory distress.  Abdominal: Soft. Bowel sounds are normal. There is no rebound and no guarding.       Left upper quadrant tenderness. No guarding. Multiple healed surgical incisions over abdomen  Musculoskeletal: Normal range of motion.  Neurological: She is alert and oriented to person, place, and time.  Skin: Skin is warm and dry.    ED Course  Procedures (including critical care time)  Pt with abodminal pain, nausea, vomiting, diarrhea. Recent cholecystectomy 2 mon ago. No problems prior to this morning. WIll get labs and monitor. Anti emetics and pain meds ordered.   Results for orders placed during the hospital encounter of 12/19/11  CBC      Component Value Range   WBC 7.1  4.0 - 10.5 (K/uL)   RBC 4.08  3.87 - 5.11 (MIL/uL)   Hemoglobin 13.6  12.0 - 15.0 (g/dL)   HCT 19.1  47.8 - 29.5 (%)   MCV 96.6  78.0 - 100.0 (fL)   MCH 33.3  26.0 - 34.0 (pg)   MCHC 34.5  30.0 - 36.0 (g/dL)   RDW 62.1  30.8 - 65.7 (%)   Platelets 399  150 - 400 (K/uL)  DIFFERENTIAL      Component Value Range   Neutrophils Relative 79 (*) 43 - 77 (%)   Neutro Abs 5.6  1.7 - 7.7 (K/uL)   Lymphocytes Relative 17  12 - 46 (%)   Lymphs Abs 1.2  0.7 - 4.0 (K/uL)   Monocytes Relative 4  3 - 12 (%)   Monocytes Absolute 0.3  0.1 - 1.0 (K/uL)   Eosinophils Relative 0  0 - 5 (%)   Eosinophils Absolute 0.0  0.0 - 0.7 (K/uL)   Basophils Relative 0  0 - 1 (%)   Basophils Absolute 0.0  0.0 - 0.1 (K/uL)  COMPREHENSIVE METABOLIC PANEL      Component Value Range   Sodium 146 (*) 135 - 145 (mEq/L)   Potassium 3.5  3.5 - 5.1 (mEq/L)    Chloride 103  96 - 112 (mEq/L)   CO2 29  19 - 32 (mEq/L)   Glucose, Bld 113 (*) 70 - 99 (mg/dL)   BUN 22  6 - 23 (mg/dL)   Creatinine, Ser 8.46  0.50 - 1.10 (mg/dL)   Calcium 9.8  8.4 - 96.2 (mg/dL)   Total  Protein 7.6  6.0 - 8.3 (g/dL)   Albumin 3.8  3.5 - 5.2 (g/dL)   AST 36  0 - 37 (U/L)   ALT 38 (*) 0 - 35 (U/L)   Alkaline Phosphatase 149 (*) 39 - 117 (U/L)   Total Bilirubin 0.3  0.3 - 1.2 (mg/dL)   GFR calc non Af Amer >90  >90 (mL/min)   GFR calc Af Amer >90  >90 (mL/min)  LIPASE, BLOOD      Component Value Range   Lipase 25  11 - 59 (U/L)  URINALYSIS, ROUTINE W REFLEX MICROSCOPIC      Component Value Range   Color, Urine YELLOW  YELLOW    APPearance HAZY (*) CLEAR    Specific Gravity, Urine 1.024  1.005 - 1.030    pH 7.0  5.0 - 8.0    Glucose, UA NEGATIVE  NEGATIVE (mg/dL)   Hgb urine dipstick NEGATIVE  NEGATIVE    Bilirubin Urine NEGATIVE  NEGATIVE    Ketones, ur NEGATIVE  NEGATIVE (mg/dL)   Protein, ur NEGATIVE  NEGATIVE (mg/dL)   Urobilinogen, UA 0.2  0.0 - 1.0 (mg/dL)   Nitrite NEGATIVE  NEGATIVE    Leukocytes, UA NEGATIVE  NEGATIVE    Ct Abdomen Pelvis W Contrast  12/19/2011  *RADIOLOGY REPORT*  Clinical Data: Abdominal pain, recent cholecystectomy  CT ABDOMEN AND PELVIS WITH CONTRAST  Technique:  Multidetector CT imaging of the abdomen and pelvis was performed following the standard protocol during bolus administration of intravenous contrast.  Contrast: OMNIPAQUE IOHEXOL 300 MG/ML IV SOLN  Comparison: 11/12/2011  Findings: Limited images through the lung bases demonstrate no significant appreciable abnormality. The heart size is within normal limits. No pleural or pericardial effusion.  And variant perfusion and/or geographic fatty infiltration of the left hepatic lobe.  Additional low attenuation area within the central liver adjacent to the gallbladder fossa is likely related to recent surgery.  There is no fluid collection in the surgical bed.  Cholecystectomy  clips are present.  No biliary duct dilatation.  Unremarkable spleen, pancreas, adrenal glands.  Postsurgical changes of the left upper pole in keeping with partial nephrectomy.  Otherwise, symmetric renal enhancement with exception of a too small further characterize interpolar right renal hypodensity.  No hydronephrosis or hydroureter.  No bowel obstruction.  No CT evidence for colitis.  Normal appendix.  No free intraperitoneal air or fluid.  No lymphadenopathy.  No lymphadenopathy.  There is scattered atherosclerotic calcification of the aorta and its branches. No aneurysmal dilatation.  Absent uterus.  No adnexal mass.  Circumferential bladder wall thickening, nonspecific given incomplete distension.  No acute osseous abnormality.  Lower lumbar multilevel degenerative changes.  Right obturator musculature lipoma.  IMPRESSION: Status post cholecystectomy with expected postoperative changes. No fluid collection in the surgical bed to suggest leak or abscess.  Hepatic steatosis.  Circumferential bladder wall thickening.  Correlate with urinalysis to exclude cystitis.  Original Report Authenticated By: Waneta Martins, M.D.   Dg Abd Acute W/chest  12/19/2011  *RADIOLOGY REPORT*  Clinical Data: Abdominal pain and vomiting  ACUTE ABDOMEN SERIES (ABDOMEN 2 VIEW & CHEST 1 VIEW)  Comparison: April 28, 2005  Findings: The cardiac silhouette, mediastinum, pulmonary vasculature are within normal limits.  Both lungs are clear.  The stool and bowel gas pattern is within normal limits with no evidence of obstruction.  No pneumoperitoneum.  There is no gross organomegaly.  No abnormal calcifications overlie the abdomen or pelvis.  There are degenerative changes at  the lower lumbar spine which have worsened since the prior study.  IMPRESSION: There is no evidence of acute abnormality.  Original Report Authenticated By: Brandon Melnick, M.D.    No elevated WBC. CT obtained to r/o colitis and is negative. Possible  inflammation around bladder, however, urine is clear. Pt's pain improved. No nausea. No vomiting here in ER. No episodes of diarrhea. Pt tolerating PO fluids. Will d/c home.  No diagnosis found.    MDM          Lottie Mussel, PA 12/19/11 2218

## 2011-12-19 NOTE — ED Notes (Signed)
Pt c/o left upper abd pain starting this am with N/V/D; pt sts hx of similar in past

## 2011-12-19 NOTE — ED Notes (Signed)
Patient has completed with water for CT. Phoned CT and informed them.

## 2011-12-19 NOTE — ED Notes (Signed)
Pt c/o mid-abd pain and N/V/D starting this am. Pt reports she has vomited "numerous" times and cleaned her abd out this am.

## 2011-12-19 NOTE — Discharge Instructions (Signed)
You CT scan today did not show any significant findings. It is possible that your nausea, vomiting, diarrhea and pain were caused by a virus. Make sure to drink plenty of fluids. Advance your diet slowly as tolerate. Follow up with your doctor in 2-3 days as needed. Take zofran for nausea as prescribed. Take vicodin for severe pain as prescribed as needed. Return if symptoms are worsening.   Abdominal Pain Abdominal pain can be caused by many things. Your caregiver decides the seriousness of your pain by an examination and possibly blood tests and X-rays. Many cases can be observed and treated at home. Most abdominal pain is not caused by a disease and will probably improve without treatment. However, in many cases, more time must pass before a clear cause of the pain can be found. Before that point, it may not be known if you need more testing, or if hospitalization or surgery is needed. HOME CARE INSTRUCTIONS   Do not take laxatives unless directed by your caregiver.   Take pain medicine only as directed by your caregiver.   Only take over-the-counter or prescription medicines for pain, discomfort, or fever as directed by your caregiver.   Try a clear liquid diet (broth, tea, or water) for as long as directed by your caregiver. Slowly move to a bland diet as tolerated.  SEEK IMMEDIATE MEDICAL CARE IF:   The pain does not go away.   You have a fever.   You keep throwing up (vomiting).   The pain is felt only in portions of the abdomen. Pain in the right side could possibly be appendicitis. In an adult, pain in the left lower portion of the abdomen could be colitis or diverticulitis.   You pass bloody or black tarry stools.  MAKE SURE YOU:   Understand these instructions.   Will watch your condition.   Will get help right away if you are not doing well or get worse.  Document Released: 08/07/2005 Document Revised: 07/10/2011 Document Reviewed: 06/15/2008 [facility] Patient Information  2012 [place], [place].

## 2012-01-13 ENCOUNTER — Ambulatory Visit (HOSPITAL_COMMUNITY): Payer: Self-pay | Attending: Family Medicine

## 2012-04-16 ENCOUNTER — Ambulatory Visit (HOSPITAL_COMMUNITY)
Admission: RE | Admit: 2012-04-16 | Discharge: 2012-04-16 | Disposition: A | Discharge status: Home / Self Care | Payer: Self-pay | Source: Ambulatory Visit | Attending: Family Medicine | Admitting: Family Medicine

## 2012-04-16 ENCOUNTER — Other Ambulatory Visit (HOSPITAL_COMMUNITY): Payer: Self-pay | Admitting: Family Medicine

## 2012-04-16 DIAGNOSIS — M545 Low back pain, unspecified: Secondary | ICD-10-CM

## 2012-05-13 ENCOUNTER — Ambulatory Visit: Payer: Self-pay | Attending: Family Medicine | Admitting: Physical Therapy

## 2012-05-13 DIAGNOSIS — M2569 Stiffness of other specified joint, not elsewhere classified: Secondary | ICD-10-CM | POA: Insufficient documentation

## 2012-05-13 DIAGNOSIS — IMO0001 Reserved for inherently not codable concepts without codable children: Secondary | ICD-10-CM | POA: Insufficient documentation

## 2012-05-13 DIAGNOSIS — M545 Low back pain, unspecified: Secondary | ICD-10-CM | POA: Insufficient documentation

## 2012-05-18 ENCOUNTER — Encounter: Payer: Self-pay | Admitting: Physical Therapy

## 2012-05-21 ENCOUNTER — Encounter: Payer: Self-pay | Admitting: Physical Therapy

## 2012-05-25 ENCOUNTER — Encounter: Payer: Self-pay | Admitting: Physical Therapy

## 2012-05-28 ENCOUNTER — Ambulatory Visit: Payer: Self-pay | Admitting: Physical Therapy

## 2012-06-01 ENCOUNTER — Encounter: Payer: Self-pay | Admitting: Physical Therapy

## 2012-06-04 ENCOUNTER — Encounter: Payer: Self-pay | Admitting: Physical Therapy

## 2012-06-08 ENCOUNTER — Encounter: Payer: Self-pay | Admitting: Physical Therapy

## 2012-07-31 ENCOUNTER — Emergency Department (HOSPITAL_COMMUNITY)
Admission: EM | Admit: 2012-07-31 | Discharge: 2012-07-31 | Disposition: A | Discharge status: Home / Self Care | Payer: Medicaid Other | Attending: Emergency Medicine | Admitting: Emergency Medicine

## 2012-07-31 ENCOUNTER — Encounter (HOSPITAL_COMMUNITY): Payer: Self-pay | Admitting: Emergency Medicine

## 2012-07-31 DIAGNOSIS — Z79899 Other long term (current) drug therapy: Secondary | ICD-10-CM | POA: Insufficient documentation

## 2012-07-31 DIAGNOSIS — Z85528 Personal history of other malignant neoplasm of kidney: Secondary | ICD-10-CM | POA: Insufficient documentation

## 2012-07-31 DIAGNOSIS — G8929 Other chronic pain: Secondary | ICD-10-CM | POA: Insufficient documentation

## 2012-07-31 DIAGNOSIS — F17200 Nicotine dependence, unspecified, uncomplicated: Secondary | ICD-10-CM | POA: Insufficient documentation

## 2012-07-31 DIAGNOSIS — J45909 Unspecified asthma, uncomplicated: Secondary | ICD-10-CM | POA: Insufficient documentation

## 2012-07-31 DIAGNOSIS — M545 Low back pain, unspecified: Secondary | ICD-10-CM | POA: Insufficient documentation

## 2012-07-31 MED ORDER — PREDNISONE 20 MG PO TABS
60.0000 mg | ORAL_TABLET | Freq: Once | ORAL | Status: AC
  Administered 2012-07-31: 60 mg via ORAL
  Filled 2012-07-31: qty 3

## 2012-07-31 MED ORDER — OXYCODONE-ACETAMINOPHEN 5-325 MG PO TABS: 1.0000 | ORAL_TABLET | Freq: Four times a day (QID) | ORAL | Status: DC | PRN

## 2012-07-31 MED ORDER — PREDNISONE 50 MG PO TABS: 50.0000 mg | ORAL_TABLET | Freq: Every day | ORAL | Status: DC

## 2012-07-31 MED ORDER — OXYCODONE-ACETAMINOPHEN 5-325 MG PO TABS
1.0000 | ORAL_TABLET | Freq: Once | ORAL | Status: AC
  Administered 2012-07-31: 1 via ORAL
  Filled 2012-07-31: qty 1

## 2012-07-31 NOTE — ED Notes (Signed)
Pt presenting to ed with c/o low back pain x years pt states [facility] usually goes [facility] but since they closed she has not been able to get her medications.

## 2012-07-31 NOTE — ED Provider Notes (Signed)
Medical screening examination/treatment/procedure(s) were performed by non-physician practitioner and as supervising physician I was immediately available for consultation/collaboration.  Gerhard Munch, MD 07/31/12 1327

## 2012-07-31 NOTE — ED Provider Notes (Signed)
History     CSN: [id number]  Arrival date & time 07/31/12  1032   First MD Initiated Contact with Patient 07/31/12 1033      Chief Complaint  Patient presents with  . Back Pain    (Consider location/radiation/quality/duration/timing/severity/associated sxs/prior treatment) HPI  The patient presents with low back pain.  The patient states that this pain is chronic for her and that her doctor's office closed, and has not had continued care for her chronic low back pain.  Patient denies abdominal pain, chest pain, shortness of breath, weakness, numbness, nausea, vomiting, diarrhea, fever, or syncope.  Patient, states she's out of her pain medicine and did not take anything prior to arrival other than Tylenol.  Patient, states, that movement and palpation make the pain, worse  Past Medical History  Diagnosis Date  . Asthma   . Renal cell carcinoma 2012    left    Past Surgical History  Procedure Date  . Kidney surgery 12/2010    [facility]; partial nephrectomy  . Hernia repair 07/2011    supraumbilical repair  . Bunionectomy 04/2011  . Tubal ligation   . Abdominal hysterectomy   . Menisectomy     left knee  . Cholecystectomy 11/13/2011    Procedure: LAPAROSCOPIC CHOLECYSTECTOMY WITH INTRAOPERATIVE CHOLANGIOGRAM;  Surgeon: Rulon Abide, DO;  Location: [facility];  Service: General;  Laterality: N/A;    No family history on file.  History  Substance Use Topics  . Smoking status: Current Some Day Smoker    Types: Cigarettes  . Smokeless tobacco: Never Used  . Alcohol Use: Yes     socially    OB History    Grav Para Term Preterm Abortions TAB SAB Ect Mult Living                  Review of Systems All other systems negative except as documented in the HPI. All pertinent positives and negatives as reviewed in the HPI.  Allergies  Azithromycin  Home Medications   Current Outpatient Rx  Name Route Sig Dispense Refill  . ALBUTEROL SULFATE HFA 108 (90 BASE) MCG/ACT IN  AERS Inhalation Inhale 2 puffs into the lungs every 6 (six) hours as needed. SHORTNESS OF BREATH/WHEEZING    . ASPIRIN EC 81 MG PO TBEC Oral Take 81 mg by mouth daily.     . CELECOXIB 200 MG PO CAPS Oral Take 200 mg by mouth daily.    Marland Kitchen ESTROGENS CONJUGATED 1.25 MG PO TABS Oral Take 1.25 mg by mouth daily.    Marland Kitchen EZETIMIBE 10 MG PO TABS Oral Take 10 mg by mouth daily.      Marland Kitchen FAMOTIDINE 20 MG PO TABS Oral Take 20 mg by mouth 2 (two) times daily.      Marland Kitchen FLUTICASONE-SALMETEROL 500-50 MCG/DOSE IN AEPB Inhalation Inhale 1 puff into the lungs every 12 (twelve) hours.     Marland Kitchen HYDROXYZINE HCL 25 MG PO TABS Oral Take 25 mg by mouth 3 (three) times daily as needed. For itching    . LEVOCETIRIZINE DIHYDROCHLORIDE 5 MG PO TABS Oral Take 5 mg by mouth daily.      Marland Kitchen MONTELUKAST SODIUM 10 MG PO TABS Oral Take 10 mg by mouth daily.      . ADULT MULTIVITAMIN W/MINERALS CH Oral Take 1 tablet by mouth daily.      . TRIAMCINOLONE ACETONIDE 55 MCG/ACT NA INHA Nasal Place 2 sprays into the nose daily.        BP  142/95  Pulse 88  Temp 99.3 F (37.4 C) (Oral)  Resp 17  SpO2 99%  Physical Exam  Nursing note and vitals reviewed. Constitutional: She is oriented to person, place, and time. She appears well-developed and well-nourished.  HENT:  Head: Normocephalic and atraumatic.  Mouth/Throat: Oropharynx is clear and moist.  Cardiovascular: Normal rate, regular rhythm and normal heart sounds.  Exam reveals no gallop and no friction rub.   No murmur heard. Pulmonary/Chest: Effort normal and breath sounds normal.  Abdominal: Soft. Bowel sounds are normal. She exhibits no distension and no mass. There is no tenderness. There is no rebound and no guarding.  Neurological: She is alert and oriented to person, place, and time. She has normal strength. She displays normal reflexes. No sensory deficit. She exhibits normal muscle tone. Coordination and gait normal.  Reflex Scores:      Patellar reflexes are 2+ on the right  side and 2+ on the left side.      Achilles reflexes are 2+ on the right side and 2+ on the left side. Skin: Skin is warm and dry. No rash noted.    ED Course  Procedures (including critical care time)  The patient is advised to return here as needed. Will refer to neurosurgery. She has normal sensation, reflexes, and motor function. The patient has normal gait as well. The patient has no findings that would indicate an intrabdominal process or aortic issue.   MDM          Carlyle Dolly, PA-C 07/31/12 1147

## 2012-08-04 ENCOUNTER — Emergency Department (INDEPENDENT_AMBULATORY_CARE_PROVIDER_SITE_OTHER)
Admission: EM | Admit: 2012-08-04 | Discharge: 2012-08-04 | Disposition: A | Discharge status: Home / Self Care | Payer: Self-pay | Source: Home / Self Care

## 2012-08-04 ENCOUNTER — Encounter (HOSPITAL_COMMUNITY): Payer: Self-pay | Admitting: *Deleted

## 2012-08-04 DIAGNOSIS — G8929 Other chronic pain: Secondary | ICD-10-CM

## 2012-08-04 DIAGNOSIS — IMO0002 Reserved for concepts with insufficient information to code with codable children: Secondary | ICD-10-CM

## 2012-08-04 MED ORDER — OXYCODONE-ACETAMINOPHEN 5-325 MG PO TABS: 2.0000 | ORAL_TABLET | ORAL | Status: DC | PRN

## 2012-08-04 MED ORDER — CYCLOBENZAPRINE HCL 10 MG PO TABS: 10.0000 mg | ORAL_TABLET | Freq: Two times a day (BID) | ORAL | Status: DC | PRN

## 2012-08-04 NOTE — ED Provider Notes (Signed)
History     CSN: [id number]  Arrival date & time 08/04/12  0915   None     Chief Complaint  Patient presents with  . Back Pain  . Medication Refill    (Consider location/radiation/quality/duration/timing/severity/associated sxs/prior treatment) HPI Comments: 66 year old female previous patient [facility]. She was being seen for chronic low back pain and treated with Percocet and Flexeril. She was told at that time she needed surgery to treat her low back pain. Today she states her pain in the lumbar areas radiating to both buttocks. She states she has a history of arthritis and a ruptured disc. She has been applying moist heat and taking pain pills as prescribed from [facility] for pain control.   Past Medical History  Diagnosis Date  . Asthma   . Renal cell carcinoma 2012    left    Past Surgical History  Procedure Date  . Kidney surgery 12/2010    [facility]; partial nephrectomy  . Hernia repair 07/2011    supraumbilical repair  . Bunionectomy 04/2011  . Tubal ligation   . Abdominal hysterectomy   . Menisectomy     left knee  . Cholecystectomy 11/13/2011    Procedure: LAPAROSCOPIC CHOLECYSTECTOMY WITH INTRAOPERATIVE CHOLANGIOGRAM;  Surgeon: Rulon Abide, DO;  Location: [facility];  Service: General;  Laterality: N/A;    Family History  Problem Relation Age of Onset  . Family history unknown: Yes    History  Substance Use Topics  . Smoking status: Current Some Day Smoker    Types: Cigarettes  . Smokeless tobacco: Never Used  . Alcohol Use: Yes     socially    OB History    Grav Para Term Preterm Abortions TAB SAB Ect Mult Living                  Review of Systems  Constitutional: Negative for fever, chills and activity change.  HENT: Negative.   Respiratory: Negative.   Cardiovascular: Negative.   Musculoskeletal:       As per HPI  Skin: Negative for color change, pallor and rash.  Neurological: Negative.     Allergies  Azithromycin  Home  Medications   Current Outpatient Rx  Name Route Sig Dispense Refill  . ALBUTEROL SULFATE HFA 108 (90 BASE) MCG/ACT IN AERS Inhalation Inhale 2 puffs into the lungs every 6 (six) hours as needed. SHORTNESS OF BREATH/WHEEZING    . ASPIRIN EC 81 MG PO TBEC Oral Take 81 mg by mouth daily.     . CELECOXIB 200 MG PO CAPS Oral Take 200 mg by mouth daily.    . CYCLOBENZAPRINE HCL 10 MG PO TABS Oral Take 1 tablet (10 mg total) by mouth 2 (two) times daily as needed for muscle spasms. 20 tablet 0  . ESTROGENS CONJUGATED 1.25 MG PO TABS Oral Take 1.25 mg by mouth daily.    Marland Kitchen EZETIMIBE 10 MG PO TABS Oral Take 10 mg by mouth daily.      Marland Kitchen FAMOTIDINE 20 MG PO TABS Oral Take 20 mg by mouth 2 (two) times daily.      Marland Kitchen FLUTICASONE-SALMETEROL 500-50 MCG/DOSE IN AEPB Inhalation Inhale 1 puff into the lungs every 12 (twelve) hours.     Marland Kitchen HYDROXYZINE HCL 25 MG PO TABS Oral Take 25 mg by mouth 3 (three) times daily as needed. For itching    . LEVOCETIRIZINE DIHYDROCHLORIDE 5 MG PO TABS Oral Take 5 mg by mouth daily.      Marland Kitchen  MONTELUKAST SODIUM 10 MG PO TABS Oral Take 10 mg by mouth daily.      . ADULT MULTIVITAMIN W/MINERALS CH Oral Take 1 tablet by mouth daily.      . OXYCODONE-ACETAMINOPHEN 5-325 MG PO TABS Oral Take 1 tablet by mouth every 6 (six) hours as needed for pain. 15 tablet 0  . OXYCODONE-ACETAMINOPHEN 5-325 MG PO TABS Oral Take 2 tablets by mouth every 4 (four) hours as needed for pain. 30 tablet 0  . PREDNISONE 50 MG PO TABS Oral Take 1 tablet (50 mg total) by mouth daily. 5 tablet 0  . TRIAMCINOLONE ACETONIDE 55 MCG/ACT NA INHA Nasal Place 2 sprays into the nose daily.        BP 137/86  Pulse 91  Temp 98.6 F (37 C) (Oral)  Resp 18  SpO2 95%  Physical Exam  Constitutional: She is oriented to person, place, and time.  Musculoskeletal:       Tenderness over the lumbosacral spine as well as para lumbosacral musculature. There is also moderate to severe tenderness in the upper buttocks  bilaterally. She is ambulatory with no evidence of distal motor weakness.  Neurological: She is alert and oriented to person, place, and time. No cranial nerve deficit.  Skin: Skin is warm and dry.  Psychiatric: She has a normal mood and affect.    ED Course  Procedures (including critical care time)  Labs Reviewed - No data to display No results found.   1. Chronic radicular pain of lower back       MDM  I reviewed the past medical history the patient, she brought some of her old records with her. Unfortunately the MRI of her back was not included. There is a recent record of a visit to the emergency department for the same complaint low back pain in which she received another refill of Percocet and Flexeril a little over a month ago. This unfortunate we are unable to continue long-term treatment of chronic pain however she will try to call a physician for memory care services. She will be eligible for Medicare in 1 month.        Hayden Rasmussen, NP 08/04/12 1121

## 2012-08-04 NOTE — ED Notes (Signed)
Pt reports chronic back pain and ruptured disc - out of muscle relaxer and pain pills - former [facility] pt

## 2012-08-06 NOTE — ED Provider Notes (Signed)
Medical screening examination/treatment/procedure(s) were performed by resident physician or non-physician practitioner and as supervising physician I was immediately available for consultation/collaboration.   Barkley Bruns MD.    Linna Hoff, MD 08/06/12 2046

## 2012-08-17 ENCOUNTER — Emergency Department (HOSPITAL_COMMUNITY)
Admission: EM | Admit: 2012-08-17 | Discharge: 2012-08-17 | Disposition: A | Discharge status: Home / Self Care | Payer: Medicaid Other | Attending: Emergency Medicine | Admitting: Emergency Medicine

## 2012-08-17 ENCOUNTER — Encounter (HOSPITAL_COMMUNITY): Payer: Self-pay

## 2012-08-17 DIAGNOSIS — J45909 Unspecified asthma, uncomplicated: Secondary | ICD-10-CM | POA: Insufficient documentation

## 2012-08-17 DIAGNOSIS — Z87891 Personal history of nicotine dependence: Secondary | ICD-10-CM | POA: Insufficient documentation

## 2012-08-17 DIAGNOSIS — M549 Dorsalgia, unspecified: Secondary | ICD-10-CM

## 2012-08-17 DIAGNOSIS — Z7982 Long term (current) use of aspirin: Secondary | ICD-10-CM | POA: Insufficient documentation

## 2012-08-17 DIAGNOSIS — Z881 Allergy status to other antibiotic agents status: Secondary | ICD-10-CM | POA: Insufficient documentation

## 2012-08-17 MED ORDER — OXYCODONE-ACETAMINOPHEN 5-325 MG PO TABS: 1.0000 | ORAL_TABLET | Freq: Four times a day (QID) | ORAL | Status: DC | PRN

## 2012-08-17 MED ORDER — CYCLOBENZAPRINE HCL 10 MG PO TABS: 10.0000 mg | ORAL_TABLET | Freq: Two times a day (BID) | ORAL | Status: DC | PRN

## 2012-08-17 NOTE — ED Provider Notes (Signed)
History     CSN: [id number]  Arrival date & time 08/17/12  1443   First MD Initiated Contact with Patient 08/17/12 1610      Chief Complaint  Patient presents with  . Back Pain    (Consider location/radiation/quality/duration/timing/severity/associated sxs/prior treatment) HPI Comments: Patient with h/o chronic back pain, severe lower back arthritis -- presents with flare of usual pain. No red flags other than age. Pain radiates into legs bilaterally. She takes flexeril and percocet for pain which has helped. Recent ED and [facility] visit for same. She ran out last week. She has been taking celebrex. No new injuries. No weakness, numbness, tingling. Onset incidious, course constant, movement makes symptoms worse.   Patient is a 66 y.o. female presenting with back pain. The history is provided by the patient and medical records.  Back Pain  Pertinent negatives include no fever, no numbness, no dysuria, no pelvic pain and no weakness.    Past Medical History  Diagnosis Date  . Asthma   . Renal cell carcinoma 2012    left    Past Surgical History  Procedure Date  . Kidney surgery 12/2010    [facility]; partial nephrectomy  . Hernia repair 07/2011    supraumbilical repair  . Bunionectomy 04/2011  . Tubal ligation   . Abdominal hysterectomy   . Menisectomy     left knee  . Cholecystectomy 11/13/2011    Procedure: LAPAROSCOPIC CHOLECYSTECTOMY WITH INTRAOPERATIVE CHOLANGIOGRAM;  Surgeon: Rulon Abide, DO;  Location: [facility];  Service: General;  Laterality: N/A;    History reviewed. No pertinent family history.  History  Substance Use Topics  . Smoking status: Former Smoker    Types: Cigarettes  . Smokeless tobacco: Never Used  . Alcohol Use: Yes     socially    OB History    Grav Para Term Preterm Abortions TAB SAB Ect Mult Living                  Review of Systems  Constitutional: Negative for fever and unexpected weight change.  Gastrointestinal: Negative for  constipation.       Negative for fecal incontinence.   Genitourinary: Negative for dysuria, hematuria, flank pain, vaginal bleeding, vaginal discharge and pelvic pain.       Negative for urinary incontinence or retention.  Musculoskeletal: Positive for back pain.  Neurological: Negative for weakness and numbness.       Denies saddle paresthesias.    Allergies  Azithromycin  Home Medications   Current Outpatient Rx  Name Route Sig Dispense Refill  . ALBUTEROL SULFATE HFA 108 (90 BASE) MCG/ACT IN AERS Inhalation Inhale 2 puffs into the lungs every 6 (six) hours as needed. SHORTNESS OF BREATH/WHEEZING    . ASPIRIN EC 81 MG PO TBEC Oral Take 81 mg by mouth daily.     . CELECOXIB 200 MG PO CAPS Oral Take 200 mg by mouth daily.    . CYCLOBENZAPRINE HCL 10 MG PO TABS Oral Take 1 tablet (10 mg total) by mouth 2 (two) times daily as needed for muscle spasms. 20 tablet 0  . ESTROGENS CONJUGATED 1.25 MG PO TABS Oral Take 1.25 mg by mouth daily.    Marland Kitchen FAMOTIDINE 20 MG PO TABS Oral Take 20 mg by mouth 2 (two) times daily.      Marland Kitchen FLUTICASONE-SALMETEROL 500-50 MCG/DOSE IN AEPB Inhalation Inhale 1 puff into the lungs every 12 (twelve) hours.     Marland Kitchen HYDROXYZINE HCL 25 MG PO TABS Oral  Take 25 mg by mouth 3 (three) times daily as needed. For itching    . LEVOCETIRIZINE DIHYDROCHLORIDE 5 MG PO TABS Oral Take 5 mg by mouth daily.      Marland Kitchen MONTELUKAST SODIUM 10 MG PO TABS Oral Take 10 mg by mouth daily.      . ADULT MULTIVITAMIN W/MINERALS CH Oral Take 1 tablet by mouth daily.      . OXYCODONE-ACETAMINOPHEN 5-325 MG PO TABS Oral Take 2 tablets by mouth every 4 (four) hours as needed for pain. 30 tablet 0  . PREDNISONE 50 MG PO TABS Oral Take 1 tablet (50 mg total) by mouth daily. 5 tablet 0  . EZETIMIBE 10 MG PO TABS Oral Take 10 mg by mouth daily.      . TRIAMCINOLONE ACETONIDE 55 MCG/ACT NA INHA Nasal Place 2 sprays into the nose daily as needed. Allergies.      BP 120/79  Pulse 101  Temp 98.8 F (37.1  C) (Oral)  Resp 16  SpO2 99%  Physical Exam  Nursing note and vitals reviewed. Constitutional: She appears well-developed and well-nourished.  HENT:  Head: Normocephalic and atraumatic.  Eyes: Conjunctivae normal are normal.  Neck: Normal range of motion. Neck supple.  Pulmonary/Chest: Effort normal.  Abdominal: Soft. There is no tenderness. There is no CVA tenderness.  Musculoskeletal: Normal range of motion.       Cervical back: Normal.       Thoracic back: Normal.       Back:       No step-off noted with palpation of spine.   Neurological: She is alert. She has normal strength and normal reflexes. No sensory deficit.       5/5 strength in entire lower extremities bilaterally. No sensation deficit.   Skin: Skin is warm and dry. No rash noted.  Psychiatric: She has a normal mood and affect.    ED Course  Procedures (including critical care time)  Labs Reviewed - No data to display No results found.   1. Back pain     4:29 PM Patient seen and examined.   Vital signs reviewed and are as follows: Filed Vitals:   08/17/12 1533  BP: 120/79  Pulse: 101  Temp: 98.8 F (37.1 C)  Resp: 16   Patient was seen and examined. No red flag s/s of low back pain. Patient was counseled on back pain precautions and told to do activity as tolerated but do not lift, push, or pull heavy objects more than 10 pounds for the next week.  Patient counseled to use ice or heat on back for no longer than 15 minutes every hour.   Patient prescribed muscle relaxer and counseled on proper use of muscle relaxant medication.    Patient prescribed narcotic pain medicine and counseled on proper use of narcotic pain medications. Counseled not to combine this medication with others containing tylenol.   Urged patient not to drink alcohol, drive, or perform any other activities that requires focus while taking either of these medications.  Patient urged to follow-up with PCP if pain does not improve  with treatment and rest or if pain becomes recurrent. Urged to return with worsening severe pain, loss of bowel or bladder control, trouble walking.   The patient verbalizes understanding and agrees with the plan.   MDM  Patient with back pain, chronic. Her biggest issue is not having PCP to manage pain. Severe arthritis demonstrated on previous x-rays. H/o renal cell carcinoma -- most previous x-ray  6/13, would not repeat today since this is usual pain. No neurological deficits. Patient is ambulatory. No warning symptoms of back pain including: loss of bowel or bladder control, night sweats, waking from sleep with back pain, unexplained fevers or weight loss, IVDU, recent trauma. No concern for cauda equina, epidural abscess, or other serious cause of back pain. Conservative measures such as rest, ice/heat and pain medicine indicated with PCP follow-up if no improvement with conservative management.         Renne Crigler, [place] 08/17/12 [phone]

## 2012-08-17 NOTE — ED Notes (Signed)
Patient reports that she has had lower back pain x 1 year. Patient states she has been diagnosed with arthritis and disc problems. Patient states that she has been out of Percocet and Flexeril x 3 days and is now having extreme pain. 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

## 2012-08-18 NOTE — ED Provider Notes (Signed)
Medical screening examination/treatment/procedure(s) were performed by non-physician practitioner and as supervising physician I was immediately available for consultation/collaboration.  Jones Skene, M.D.      Jones Skene, MD 08/18/12 [phone]

## 2012-08-26 ENCOUNTER — Emergency Department (INDEPENDENT_AMBULATORY_CARE_PROVIDER_SITE_OTHER)
Admission: EM | Admit: 2012-08-26 | Discharge: 2012-08-26 | Disposition: A | Discharge status: Home / Self Care | Payer: Self-pay | Source: Home / Self Care | Attending: Family Medicine | Admitting: Family Medicine

## 2012-08-26 ENCOUNTER — Encounter (HOSPITAL_COMMUNITY): Payer: Self-pay

## 2012-08-26 DIAGNOSIS — G8929 Other chronic pain: Secondary | ICD-10-CM

## 2012-08-26 DIAGNOSIS — M545 Low back pain: Secondary | ICD-10-CM

## 2012-08-26 MED ORDER — OXYCODONE-ACETAMINOPHEN 5-325 MG PO TABS: 1.0000 | ORAL_TABLET | ORAL | Status: DC | PRN

## 2012-08-26 MED ORDER — DICLOFENAC POTASSIUM 50 MG PO TABS: 50.0000 mg | ORAL_TABLET | Freq: Three times a day (TID) | ORAL | Status: DC

## 2012-08-26 NOTE — ED Notes (Signed)
C/o long standing history of lower back pain; no changes or new injury, just continued symptoms

## 2012-08-26 NOTE — ED Provider Notes (Signed)
History     CSN: [id number]  Arrival date & time 08/26/12  1457   First MD Initiated Contact with Patient 08/26/12 1615      Chief Complaint  Patient presents with  . Back Pain    (Consider location/radiation/quality/duration/timing/severity/associated sxs/prior treatment) Patient is a 66 y.o. female presenting with back pain. The history is provided by the patient.  Back Pain  This is a chronic problem. Episode onset: seen in ER on 10/7 and given "only 8 pain pills and they have run out". chronic with no change in sx. The problem has not changed since onset.The pain is associated with no known injury. The pain is moderate. The symptoms are aggravated by bending. Pertinent negatives include no chest pain, no abdominal pain, no bowel incontinence, no perianal numbness, no bladder incontinence, no dysuria, no pelvic pain, no leg pain, no paresthesias, no tingling and no weakness. She has tried muscle relaxants and NSAIDs for the symptoms.    Past Medical History  Diagnosis Date  . Asthma   . Renal cell carcinoma 2012    left    Past Surgical History  Procedure Date  . Kidney surgery 12/2010    [facility]; partial nephrectomy  . Hernia repair 07/2011    supraumbilical repair  . Bunionectomy 04/2011  . Tubal ligation   . Abdominal hysterectomy   . Menisectomy     left knee  . Cholecystectomy 11/13/2011    Procedure: LAPAROSCOPIC CHOLECYSTECTOMY WITH INTRAOPERATIVE CHOLANGIOGRAM;  Surgeon: Rulon Abide, DO;  Location: [facility];  Service: General;  Laterality: N/A;    History reviewed. No pertinent family history.  History  Substance Use Topics  . Smoking status: Former Smoker    Types: Cigarettes  . Smokeless tobacco: Never Used  . Alcohol Use: Yes     socially    OB History    Grav Para Term Preterm Abortions TAB SAB Ect Mult Living                  Review of Systems  Constitutional: Negative.   Cardiovascular: Negative for chest pain.  Gastrointestinal: Negative.   Negative for abdominal pain and bowel incontinence.  Genitourinary: Negative for bladder incontinence, dysuria and pelvic pain.  Musculoskeletal: Positive for back pain.  Neurological: Negative.  Negative for tingling, weakness and paresthesias.    Allergies  Azithromycin  Home Medications   Current Outpatient Rx  Name Route Sig Dispense Refill  . ALBUTEROL SULFATE HFA 108 (90 BASE) MCG/ACT IN AERS Inhalation Inhale 2 puffs into the lungs every 6 (six) hours as needed. SHORTNESS OF BREATH/WHEEZING    . ASPIRIN EC 81 MG PO TBEC Oral Take 81 mg by mouth daily.     . CELECOXIB 200 MG PO CAPS Oral Take 200 mg by mouth daily.    . CYCLOBENZAPRINE HCL 10 MG PO TABS Oral Take 1 tablet (10 mg total) by mouth 2 (two) times daily as needed for muscle spasms. 20 tablet 0  . CYCLOBENZAPRINE HCL 10 MG PO TABS Oral Take 1 tablet (10 mg total) by mouth 2 (two) times daily as needed for muscle spasms. 15 tablet 0  . DICLOFENAC POTASSIUM 50 MG PO TABS Oral Take 1 tablet (50 mg total) by mouth 3 (three) times daily. For back pain 30 tablet 0  . ESTROGENS CONJUGATED 1.25 MG PO TABS Oral Take 1.25 mg by mouth daily.    Marland Kitchen EZETIMIBE 10 MG PO TABS Oral Take 10 mg by mouth daily.      Marland Kitchen  FAMOTIDINE 20 MG PO TABS Oral Take 20 mg by mouth 2 (two) times daily.      Marland Kitchen FLUTICASONE-SALMETEROL 500-50 MCG/DOSE IN AEPB Inhalation Inhale 1 puff into the lungs every 12 (twelve) hours.     Marland Kitchen HYDROXYZINE HCL 25 MG PO TABS Oral Take 25 mg by mouth 3 (three) times daily as needed. For itching    . LEVOCETIRIZINE DIHYDROCHLORIDE 5 MG PO TABS Oral Take 5 mg by mouth daily.      Marland Kitchen MONTELUKAST SODIUM 10 MG PO TABS Oral Take 10 mg by mouth daily.      . ADULT MULTIVITAMIN W/MINERALS CH Oral Take 1 tablet by mouth daily.      . OXYCODONE-ACETAMINOPHEN 5-325 MG PO TABS Oral Take 2 tablets by mouth every 4 (four) hours as needed for pain. 30 tablet 0  . OXYCODONE-ACETAMINOPHEN 5-325 MG PO TABS Oral Take 1-2 tablets by mouth every 6  (six) hours as needed for pain. 15 tablet 0  . OXYCODONE-ACETAMINOPHEN 5-325 MG PO TABS Oral Take 1 tablet by mouth every 4 (four) hours as needed for pain. 10 tablet 0  . PREDNISONE 50 MG PO TABS Oral Take 1 tablet (50 mg total) by mouth daily. 5 tablet 0  . TRIAMCINOLONE ACETONIDE 55 MCG/ACT NA INHA Nasal Place 2 sprays into the nose daily as needed. Allergies.      BP 84/63  Pulse 110  Temp 98.3 F (36.8 C) (Oral)  Resp 10  SpO2 100%  Physical Exam  Nursing note and vitals reviewed. Constitutional: She is oriented to person, place, and time. She appears well-developed and well-nourished.  HENT:  Head: Normocephalic.  Neck: Normal range of motion. Neck supple.  Abdominal: Bowel sounds are normal. There is no tenderness.  Musculoskeletal: She exhibits tenderness.       Lumbar back: She exhibits decreased range of motion, tenderness, bony tenderness, pain and spasm. She exhibits normal pulse.  Neurological: She is alert and oriented to person, place, and time.       Nl peripheral ehl strength and sensation., lower ext strength for standing and walking is nl.  Skin: Skin is warm and dry.    ED Course  Procedures (including critical care time)  Labs Reviewed - No data to display No results found.   1. Chronic lower back pain       MDM          Linna Hoff, MD 08/26/12 1650

## 2012-11-19 ENCOUNTER — Other Ambulatory Visit: Payer: Self-pay | Admitting: Family Medicine

## 2012-11-19 DIAGNOSIS — Z780 Asymptomatic menopausal state: Secondary | ICD-10-CM

## 2012-11-19 DIAGNOSIS — Z1231 Encounter for screening mammogram for malignant neoplasm of breast: Secondary | ICD-10-CM

## 2012-12-12 ENCOUNTER — Emergency Department (HOSPITAL_COMMUNITY): Payer: Self-pay

## 2012-12-12 ENCOUNTER — Emergency Department (HOSPITAL_COMMUNITY)
Admission: EM | Admit: 2012-12-12 | Discharge: 2012-12-12 | Disposition: A | Discharge status: Home / Self Care | Payer: Self-pay | Attending: Emergency Medicine | Admitting: Emergency Medicine

## 2012-12-12 ENCOUNTER — Encounter (HOSPITAL_COMMUNITY): Payer: Self-pay | Admitting: Emergency Medicine

## 2012-12-12 DIAGNOSIS — J45909 Unspecified asthma, uncomplicated: Secondary | ICD-10-CM | POA: Insufficient documentation

## 2012-12-12 DIAGNOSIS — R748 Abnormal levels of other serum enzymes: Secondary | ICD-10-CM | POA: Insufficient documentation

## 2012-12-12 DIAGNOSIS — Z85528 Personal history of other malignant neoplasm of kidney: Secondary | ICD-10-CM | POA: Insufficient documentation

## 2012-12-12 DIAGNOSIS — R109 Unspecified abdominal pain: Secondary | ICD-10-CM | POA: Insufficient documentation

## 2012-12-12 DIAGNOSIS — Z87891 Personal history of nicotine dependence: Secondary | ICD-10-CM | POA: Insufficient documentation

## 2012-12-12 DIAGNOSIS — R112 Nausea with vomiting, unspecified: Secondary | ICD-10-CM | POA: Insufficient documentation

## 2012-12-12 DIAGNOSIS — R059 Cough, unspecified: Secondary | ICD-10-CM | POA: Insufficient documentation

## 2012-12-12 DIAGNOSIS — R197 Diarrhea, unspecified: Secondary | ICD-10-CM | POA: Insufficient documentation

## 2012-12-12 DIAGNOSIS — R05 Cough: Secondary | ICD-10-CM | POA: Insufficient documentation

## 2012-12-12 DIAGNOSIS — Z79899 Other long term (current) drug therapy: Secondary | ICD-10-CM | POA: Insufficient documentation

## 2012-12-12 DIAGNOSIS — Z7982 Long term (current) use of aspirin: Secondary | ICD-10-CM | POA: Insufficient documentation

## 2012-12-12 DIAGNOSIS — IMO0002 Reserved for concepts with insufficient information to code with codable children: Secondary | ICD-10-CM | POA: Insufficient documentation

## 2012-12-12 DIAGNOSIS — Z90710 Acquired absence of both cervix and uterus: Secondary | ICD-10-CM | POA: Insufficient documentation

## 2012-12-12 DIAGNOSIS — R0789 Other chest pain: Secondary | ICD-10-CM | POA: Insufficient documentation

## 2012-12-12 DIAGNOSIS — D649 Anemia, unspecified: Secondary | ICD-10-CM | POA: Insufficient documentation

## 2012-12-12 LAB — URINALYSIS, ROUTINE W REFLEX MICROSCOPIC
Bilirubin Urine: NEGATIVE
Hgb urine dipstick: NEGATIVE
Ketones, ur: NEGATIVE mg/dL
Specific Gravity, Urine: 1.012 (ref 1.005–1.030)
Urobilinogen, UA: 0.2 mg/dL (ref 0.0–1.0)
pH: 6 (ref 5.0–8.0)

## 2012-12-12 LAB — COMPREHENSIVE METABOLIC PANEL
ALT: 22 U/L (ref 0–35)
AST: 144 U/L — ABNORMAL HIGH (ref 0–37)
Alkaline Phosphatase: 353 U/L — ABNORMAL HIGH (ref 39–117)
CO2: 25 mEq/L (ref 19–32)
Calcium: 8.8 mg/dL (ref 8.4–10.5)
GFR calc Af Amer: >90 mL/min (ref >90)
GFR calc non Af Amer: 88 mL/min — ABNORMAL LOW (ref >90)
Glucose, Bld: 95 mg/dL (ref 70–99)
Potassium: 3.3 mEq/L — ABNORMAL LOW (ref 3.5–5.1)
Sodium: 134 mEq/L — ABNORMAL LOW (ref 135–145)

## 2012-12-12 LAB — CBC WITH DIFFERENTIAL/PLATELET
Basophils Absolute: 0.1 10*3/uL (ref 0.0–0.1)
Eosinophils Relative: 1 % (ref 0–5)
Lymphocytes Relative: 27 % (ref 12–46)
Lymphs Abs: 1.8 10*3/uL (ref 0.7–4.0)
MCV: 109.8 fL — ABNORMAL HIGH (ref 78.0–100.0)
Neutro Abs: 4.1 10*3/uL (ref 1.7–7.7)
Neutrophils Relative %: 62 % (ref 43–77)
Platelets: 352 10*3/uL (ref 150–400)
RBC: 2.96 MIL/uL — ABNORMAL LOW (ref 3.87–5.11)
RDW: 17.1 % — ABNORMAL HIGH (ref 11.5–15.5)
WBC: 6.6 10*3/uL (ref 4.0–10.5)

## 2012-12-12 LAB — TROPONIN I: Troponin I: <0.3 ng/mL (ref <0.30)

## 2012-12-12 LAB — URINE MICROSCOPIC-ADD ON

## 2012-12-12 LAB — OCCULT BLOOD, POC DEVICE: Fecal Occult Bld: NEGATIVE

## 2012-12-12 MED ORDER — POTASSIUM CHLORIDE CRYS ER 20 MEQ PO TBCR
30.0000 meq | EXTENDED_RELEASE_TABLET | Freq: Once | ORAL | Status: AC
  Administered 2012-12-12: 30 meq via ORAL
  Filled 2012-12-12: qty 2

## 2012-12-12 NOTE — ED Provider Notes (Signed)
History     CSN: [id number]  Arrival date & time 12/12/12  1445   First MD Initiated Contact with Patient 12/12/12 [phone]      Chief Complaint  Patient presents with  . Elevated liver enzymes   . Abdominal Pain  . Chest Pain    (Consider location/radiation/quality/duration/timing/severity/associated sxs/prior treatment) HPI Comments: Patient is 67 y/o female who presents for chest pain x 2 weeks and lower abdominal pain x 4 weeks. States chest pain is more of a pressure that comes and goes, lasting for a few seconds, is non-radiating and has no aggravating or alleviating factors. Her second complaint of abdominal pain has been present for 4 weeks. Feels like "needles" across her lower abdomen which comes and goes, lasting a few seconds. Also has no aggravating or alleviating factors. Patient saw her PCP for these complaints who sent her for blood work which came back showing abnormal LFTs and anemia. PCP referred patient to gastroenterologist and hematologist, but she has not followed up because of lack of insurance. Patient admits to productive cough, sometimes coughing so hard that she vomits, as well as 2 episodes of diarrhea this a.m. She denies fever, chills, shortness of breath, dysuria, hematuria, and flank pain. PMH includes cholecystectomy, supraumbilical hernia repair, abdominal hysterectomy, and partial nephrectomy for renal cell carcinoma.  Patient is a 67 y.o. female presenting with abdominal pain and chest pain.  Abdominal Pain The primary symptoms of the illness include abdominal pain, nausea, vomiting and diarrhea. The primary symptoms of the illness do not include fever, shortness of breath or dysuria.  Symptoms associated with the illness do not include chills, constipation, urgency or hematuria.  Chest Pain Primary symptoms include cough, abdominal pain, nausea and vomiting. Pertinent negatives for primary symptoms include no fever, no shortness of breath, no wheezing and no  dizziness.  Pertinent negatives for associated symptoms include no weakness.     Past Medical History  Diagnosis Date  . Asthma   . Renal cell carcinoma 2012    left    Past Surgical History  Procedure Date  . Kidney surgery 12/2010    [facility]; partial nephrectomy  . Hernia repair 07/2011    supraumbilical repair  . Bunionectomy 04/2011  . Tubal ligation   . Abdominal hysterectomy   . Menisectomy     left knee  . Cholecystectomy 11/13/2011    Procedure: LAPAROSCOPIC CHOLECYSTECTOMY WITH INTRAOPERATIVE CHOLANGIOGRAM;  Surgeon: Rulon Abide, DO;  Location: [facility];  Service: General;  Laterality: N/A;    No family history on file.  History  Substance Use Topics  . Smoking status: Former Smoker    Types: Cigarettes  . Smokeless tobacco: Never Used  . Alcohol Use: Yes     Comment: socially    OB History    Grav Para Term Preterm Abortions TAB SAB Ect Mult Living                  Review of Systems  Constitutional: Negative for fever and chills.  HENT: Negative for congestion and rhinorrhea.   Eyes: Negative for photophobia and visual disturbance.  Respiratory: Positive for cough. Negative for chest tightness, shortness of breath and wheezing.   Cardiovascular: Positive for chest pain. Negative for leg swelling.  Gastrointestinal: Positive for nausea, vomiting, abdominal pain and diarrhea. Negative for constipation and blood in stool.       States has occasional episodes of non-bloody, non-bilious emesis. Admits to two episodes of non-bloody diarrhea today.  Genitourinary: Negative  for dysuria, urgency, hematuria, flank pain and decreased urine volume.  Neurological: Negative for dizziness, weakness and light-headedness.    Allergies  Azithromycin  Home Medications   Current Outpatient Rx  Name  Route  Sig  Dispense  Refill  . ALBUTEROL SULFATE HFA 108 (90 BASE) MCG/ACT IN AERS   Inhalation   Inhale 2 puffs into the lungs every 6 (six) hours as needed.  SHORTNESS OF BREATH/WHEEZING         . ASPIRIN EC 81 MG PO TBEC   Oral   Take 81 mg by mouth daily.          . CELECOXIB 200 MG PO CAPS   Oral   Take 200 mg by mouth daily.         . CYCLOBENZAPRINE HCL 10 MG PO TABS   Oral   Take 10 mg by mouth 2 (two) times daily as needed. Muscle spasms         . DICLOFENAC POTASSIUM 50 MG PO TABS   Oral   Take 50 mg by mouth 3 (three) times daily. For back pain         . ESTROGENS CONJUGATED 1.25 MG PO TABS   Oral   Take 1.25 mg by mouth daily.         Marland Kitchen EZETIMIBE 10 MG PO TABS   Oral   Take 10 mg by mouth daily.           Marland Kitchen FAMOTIDINE 20 MG PO TABS   Oral   Take 20 mg by mouth 2 (two) times daily.           Marland Kitchen FLUTICASONE-SALMETEROL 500-50 MCG/DOSE IN AEPB   Inhalation   Inhale 1 puff into the lungs every 12 (twelve) hours.          Marland Kitchen HYDROXYZINE HCL 25 MG PO TABS   Oral   Take 25 mg by mouth 3 (three) times daily as needed. For itching         . LEVOCETIRIZINE DIHYDROCHLORIDE 5 MG PO TABS   Oral   Take 5 mg by mouth daily.          Marland Kitchen MONTELUKAST SODIUM 10 MG PO TABS   Oral   Take 10 mg by mouth daily.           . ADULT MULTIVITAMIN W/MINERALS CH   Oral   Take 1 tablet by mouth daily.           Marland Kitchen PREDNISONE 50 MG PO TABS   Oral   Take 1 tablet (50 mg total) by mouth daily.   5 tablet   0   . TRIAMCINOLONE ACETONIDE 55 MCG/ACT NA INHA   Nasal   Place 2 sprays into the nose daily as needed. Allergies.           BP 120/68  Pulse 96  Temp 98.3 F (36.8 C) (Oral)  Resp 20  SpO2 99%  Physical Exam  Constitutional: She is oriented to person, place, and time. She appears well-developed and well-nourished. No distress.  HENT:  Head: Normocephalic and atraumatic.  Eyes: EOM are normal. Pupils are equal, round, and reactive to light. No scleral icterus.  Cardiovascular: Normal rate, regular rhythm and normal heart sounds.   Pulmonary/Chest: Effort normal and breath sounds normal. No  respiratory distress. She has no wheezes.  Abdominal: Soft. Bowel sounds are normal. She exhibits no distension. There is no tenderness. There is no guarding.  Genitourinary: Rectum normal. Rectal exam  shows no mass, no tenderness and anal tone normal.       Fecal occult test negative for gross blood  Musculoskeletal: Normal range of motion.  Lymphadenopathy:    She has no cervical adenopathy.  Neurological: She is alert and oriented to person, place, and time.  Psychiatric: She has a normal mood and affect. Her behavior is normal.    ED Course  Procedures (including critical care time)  Labs Reviewed  CBC WITH DIFFERENTIAL - Abnormal; Notable for the following:    RBC 2.96 (*)     Hemoglobin 10.7 (*)     HCT 32.5 (*)     MCV 109.8 (*)     MCH 36.1 (*)     RDW 17.1 (*)     All other components within normal limits  COMPREHENSIVE METABOLIC PANEL - Abnormal; Notable for the following:    Sodium 134 (*)     Potassium 3.3 (*)     Chloride 94 (*)     BUN 5 (*)     Albumin 2.7 (*)     AST 144 (*)     Alkaline Phosphatase 353 (*)     GFR calc non Af Amer 88 (*)     All other components within normal limits  URINALYSIS, ROUTINE W REFLEX MICROSCOPIC - Abnormal; Notable for the following:    APPearance CLOUDY (*)     Nitrite POSITIVE (*)     Leukocytes, UA TRACE (*)     All other components within normal limits  URINE MICROSCOPIC-ADD ON - Abnormal; Notable for the following:    Squamous Epithelial / LPF FEW (*)     Bacteria, UA MANY (*)     All other components within normal limits  LIPASE, BLOOD  TROPONIN I  OCCULT BLOOD, POC DEVICE  URINE CULTURE   Dg Chest 2 View  12/12/2012  *RADIOLOGY REPORT*  Clinical Data: Mid chest pain, asthma, smoker  CHEST - 2 VIEW  Comparison: 12/03/2010; 08/02/2008; 03/28/2004  Findings: Grossly unchanged cardiac silhouette and mediastinal contours.  No focal airspace opacity.  No pleural effusion or pneumothorax.  Unchanged bones.  Post  cholecystectomy.  IMPRESSION: No acute cardiopulmonary disease.   Original Report Authenticated By: Tacey Ruiz, MD      No diagnosis found.    MDM  Patient presents with chest pressure x 2 weeks and lower abdominal pain x 4 wks. Both are episodic, lasting for a few seconds with no aggravating or alleviating factors. Patient has seen PCP for complaints and was asked to complete blood work for work up. Showed elevated LFTs and anemia; patient referred to gastroenterology and hematology.   Today patient is anemic with Hgb of 10.7 and Hct of 32.5. This is a drop from previous levels on 12/19/11. Will get fecal occult blood to see if stool is positive for blood. Will also obtain CBC to assess LFT levels. Given the patient's vague abdominal pain and chest pressure in epigastric region, will also obtain lipase and UA. Have discussed the patient with Dr. Ethelda Chick who believes patient warrants further work up with troponin and chest xray. Will continue to monitor.  Patient's potassium levels 3.3; potassium PO ordered. AST elevated at 144 and alk phos elevated at 353. Patient warrants follow up with gastroenterologist as previously recommended by PCP. Will refer her to [place] today at discharge. Patient is familiar with this physicians at this practice as states her last colonoscopy was done with them. UA also positive for bacteruria, nitrites and  trace leukocytes. The patient, however, is not having any dysuria, increased frequency or urgency in ED today. Patient has been verbally informed of her pending urine culture results and that she will be contact if culture results are positive. Patient states she is comfortable with and understands this treatment plan. Plan discussed with Dr. Ethelda Chick who is in agreement.        Antony Madura, PA-C 12/12/12 2005

## 2012-12-12 NOTE — ED Notes (Signed)
Pt states that she was referred by a doctor to a gastroenterologist for elevated liver enzymes.  C/o sharp LUQ pain and chest pressure in the center of her chest.

## 2012-12-12 NOTE — ED Notes (Addendum)
Bedside report received from previous RN. Pt states she is very hungry. Will verify it is acceptable for her to eat.

## 2012-12-12 NOTE — ED Notes (Signed)
Pt c/o n/v/d and abdominal pain x 2 days.  Pain score 8/10.

## 2012-12-12 NOTE — ED Provider Notes (Signed)
Medical screening examination/treatment/procedure(s) were conducted as a shared visit with non-physician practitioner(s) and myself.  I personally evaluated the patient during the encounter  Doug Sou, MD 12/12/12 [phone]

## 2012-12-12 NOTE — ED Provider Notes (Signed)
Presents with anterior chest pain for the past 2 weeks lasting 10 seconds at a time. Not made better or worse by anything. Patient also reports abdominal pain for approximately 4 weeks diffuse, crampy lasting 1-2 seconds at a time. Patient reports diarrhea since yesterday. 2 episodes this morning. She is presently asymptomatic and feels well and hungry on exam no distress lungs clear auscultation heart regular in rhythm abdomen normoactive bowel sounds nondistended nontender  Date: 12/12/2012  Rate: 110  Rhythm: sinus tachycardia  QRS Axis: normal  Intervals: normal  ST/T Wave abnormalities: nonspecific T wave changes  Conduction Disutrbances:none  Narrative Interpretation:   Old EKG Reviewed: changes noted Diffuse nonspecific T wave changes new from 12/03/2010 as interpreted by me  Chest pain atypical for acute coronary syndrome. Nonexertional lasting only a few seconds at a time abdominal pain is nonspecific as well. Patient has been seen by her primary care physician for same complaint and referred to hematologist gastroenterologist she has not followed up due to lack of insurance. I suggest she followup with her primary care physician next week   Doug Sou, MD 12/12/12 2345

## 2012-12-13 LAB — URINE CULTURE

## 2012-12-15 ENCOUNTER — Other Ambulatory Visit: Payer: Self-pay

## 2012-12-15 ENCOUNTER — Ambulatory Visit: Payer: Self-pay

## 2013-02-07 ENCOUNTER — Emergency Department (HOSPITAL_COMMUNITY)
Admission: EM | Admit: 2013-02-07 | Discharge: 2013-02-07 | Disposition: A | Discharge status: Home / Self Care | Payer: Medicaid Other | Attending: Emergency Medicine | Admitting: Emergency Medicine

## 2013-02-07 ENCOUNTER — Emergency Department (HOSPITAL_COMMUNITY): Payer: Medicaid Other

## 2013-02-07 ENCOUNTER — Encounter (HOSPITAL_COMMUNITY): Payer: Self-pay | Admitting: Emergency Medicine

## 2013-02-07 DIAGNOSIS — S79919A Unspecified injury of unspecified hip, initial encounter: Secondary | ICD-10-CM | POA: Insufficient documentation

## 2013-02-07 DIAGNOSIS — Y9389 Activity, other specified: Secondary | ICD-10-CM | POA: Insufficient documentation

## 2013-02-07 DIAGNOSIS — Z79899 Other long term (current) drug therapy: Secondary | ICD-10-CM | POA: Insufficient documentation

## 2013-02-07 DIAGNOSIS — Z87891 Personal history of nicotine dependence: Secondary | ICD-10-CM | POA: Insufficient documentation

## 2013-02-07 DIAGNOSIS — Y92009 Unspecified place in unspecified non-institutional (private) residence as the place of occurrence of the external cause: Secondary | ICD-10-CM | POA: Insufficient documentation

## 2013-02-07 DIAGNOSIS — J45909 Unspecified asthma, uncomplicated: Secondary | ICD-10-CM | POA: Insufficient documentation

## 2013-02-07 DIAGNOSIS — Z85528 Personal history of other malignant neoplasm of kidney: Secondary | ICD-10-CM | POA: Insufficient documentation

## 2013-02-07 DIAGNOSIS — S99929A Unspecified injury of unspecified foot, initial encounter: Secondary | ICD-10-CM | POA: Insufficient documentation

## 2013-02-07 DIAGNOSIS — Z7982 Long term (current) use of aspirin: Secondary | ICD-10-CM | POA: Insufficient documentation

## 2013-02-07 DIAGNOSIS — T148XXA Other injury of unspecified body region, initial encounter: Secondary | ICD-10-CM | POA: Insufficient documentation

## 2013-02-07 DIAGNOSIS — W19XXXA Unspecified fall, initial encounter: Secondary | ICD-10-CM

## 2013-02-07 DIAGNOSIS — Z791 Long term (current) use of non-steroidal anti-inflammatories (NSAID): Secondary | ICD-10-CM | POA: Insufficient documentation

## 2013-02-07 DIAGNOSIS — S79929A Unspecified injury of unspecified thigh, initial encounter: Secondary | ICD-10-CM | POA: Insufficient documentation

## 2013-02-07 DIAGNOSIS — IMO0002 Reserved for concepts with insufficient information to code with codable children: Secondary | ICD-10-CM | POA: Insufficient documentation

## 2013-02-07 DIAGNOSIS — W010XXA Fall on same level from slipping, tripping and stumbling without subsequent striking against object, initial encounter: Secondary | ICD-10-CM | POA: Insufficient documentation

## 2013-02-07 DIAGNOSIS — S8990XA Unspecified injury of unspecified lower leg, initial encounter: Secondary | ICD-10-CM | POA: Insufficient documentation

## 2013-02-07 DIAGNOSIS — Z9889 Other specified postprocedural states: Secondary | ICD-10-CM | POA: Insufficient documentation

## 2013-02-07 MED ORDER — METHOCARBAMOL 500 MG PO TABS: 500.0000 mg | ORAL_TABLET | Freq: Two times a day (BID) | ORAL | Status: DC

## 2013-02-07 MED ORDER — HYDROCODONE-ACETAMINOPHEN 5-325 MG PO TABS
2.0000 | ORAL_TABLET | Freq: Once | ORAL | Status: AC
  Administered 2013-02-07: 2 via ORAL
  Filled 2013-02-07: qty 2

## 2013-02-07 MED ORDER — HYDROCODONE-ACETAMINOPHEN 5-325 MG PO TABS: 1.0000 | ORAL_TABLET | Freq: Four times a day (QID) | ORAL | Status: DC | PRN

## 2013-02-07 NOTE — ED Notes (Signed)
Pt ambulated to room without distress.  

## 2013-02-07 NOTE — ED Notes (Signed)
Pt has full movement of extremities. Pt has 2+ peripheral pulses.

## 2013-02-07 NOTE — ED Provider Notes (Signed)
History     CSN: [id number]  Arrival date & time 02/07/13  1319   First MD Initiated Contact with Patient 02/07/13 1507      Chief Complaint  Patient presents with  . Headache    struck l/side of on nightstand  . Back Pain    low back pain, nonradiating  . Hip Pain    l/hip pain  . Knee Pain    bilat, knee pain-due to fall  . Fall    36 hrs ago-pt tripped over dog, denies dizziness or LOC    (Consider location/radiation/quality/duration/timing/severity/associated sxs/prior treatment) HPI Comments: Pt comes in with cc of diffuse body aches from a fall. Pt had a fall at 2:30 am on 02/06/13. She tripped over her dog, and fell onto hardwood floor. She has been ambulating since the fall - but is hurting all over. She has taken no pain meds. Shedecided to come to the ER as the pain was getting worse. There is no n/v/f/c. There is no headache - but she does have left sided facial pain. The pain is worst over the lumbar region and the left hip.  Patient is a 67 y.o. female presenting with headaches, back pain, hip pain, knee pain, and fall. The history is provided by the patient.  Headache Associated symptoms: back pain and myalgias   Associated symptoms: no abdominal pain, no nausea, no neck pain and no vomiting   Back Pain Associated symptoms: headaches   Associated symptoms: no abdominal pain, no chest pain and no dysuria   Hip Pain Associated symptoms include headaches. Pertinent negatives include no chest pain, no abdominal pain and no shortness of breath.  Knee Pain Associated symptoms: back pain   Associated symptoms: no neck pain   Fall Associated symptoms include headaches. Pertinent negatives include no abdominal pain, no nausea and no vomiting.    Past Medical History  Diagnosis Date  . Asthma   . Renal cell carcinoma 2012    left    Past Surgical History  Procedure Laterality Date  . Kidney surgery  12/2010    [facility]; partial nephrectomy  . Hernia repair  07/2011     supraumbilical repair  . Bunionectomy  04/2011  . Tubal ligation    . Abdominal hysterectomy    . Menisectomy      left knee  . Cholecystectomy  11/13/2011    Procedure: LAPAROSCOPIC CHOLECYSTECTOMY WITH INTRAOPERATIVE CHOLANGIOGRAM;  Surgeon: Rulon Abide, DO;  Location: [facility];  Service: General;  Laterality: N/A;    History reviewed. No pertinent family history.  History  Substance Use Topics  . Smoking status: Former Smoker    Types: Cigarettes  . Smokeless tobacco: Never Used  . Alcohol Use: Yes     Comment: socially    OB History   Grav Para Term Preterm Abortions TAB SAB Ect Mult Living                  Review of Systems  Constitutional: Positive for activity change.  HENT: Negative for neck pain.   Respiratory: Negative for shortness of breath.   Cardiovascular: Negative for chest pain.  Gastrointestinal: Negative for nausea, vomiting and abdominal pain.  Genitourinary: Negative for dysuria.  Musculoskeletal: Positive for myalgias, back pain, joint swelling, arthralgias and gait problem.  Skin: Negative for rash and wound.  Neurological: Positive for headaches.  Hematological: Does not bruise/bleed easily.    Allergies  Azithromycin  Home Medications   Current Outpatient Rx  Name  Route  Sig  Dispense  Refill  . albuterol (PROVENTIL HFA;VENTOLIN HFA) 108 (90 BASE) MCG/ACT inhaler   Inhalation   Inhale 2 puffs into the lungs every 6 (six) hours as needed. SHORTNESS OF BREATH/WHEEZING         . aspirin EC 81 MG tablet   Oral   Take 81 mg by mouth daily.          . celecoxib (CELEBREX) 200 MG capsule   Oral   Take 200 mg by mouth daily.         . cyclobenzaprine (FLEXERIL) 10 MG tablet   Oral   Take 10 mg by mouth 2 (two) times daily as needed. Muscle spasms         . diclofenac (CATAFLAM) 50 MG tablet   Oral   Take 50 mg by mouth 3 (three) times daily. For back pain         . estrogens, conjugated, (PREMARIN) 1.25 MG tablet    Oral   Take 1.25 mg by mouth daily.         Marland Kitchen ezetimibe (ZETIA) 10 MG tablet   Oral   Take 10 mg by mouth daily.           . famotidine (PEPCID) 20 MG tablet   Oral   Take 20 mg by mouth 2 (two) times daily.           . Fluticasone-Salmeterol (ADVAIR) 500-50 MCG/DOSE AEPB   Inhalation   Inhale 1 puff into the lungs every 12 (twelve) hours.          Marland Kitchen levocetirizine (XYZAL) 5 MG tablet   Oral   Take 5 mg by mouth daily.          . montelukast (SINGULAIR) 10 MG tablet   Oral   Take 10 mg by mouth daily.           . Multiple Vitamin (MULITIVITAMIN WITH MINERALS) TABS   Oral   Take 1 tablet by mouth daily.           . predniSONE (DELTASONE) 50 MG tablet   Oral   Take 1 tablet (50 mg total) by mouth daily.   5 tablet   0   . triamcinolone (NASACORT) 55 MCG/ACT nasal inhaler   Nasal   Place 2 sprays into the nose daily as needed. Allergies.         Marland Kitchen HYDROcodone-acetaminophen (NORCO/VICODIN) 5-325 MG per tablet   Oral   Take 1 tablet by mouth every 6 (six) hours as needed for pain.   15 tablet   0   . methocarbamol (ROBAXIN) 500 MG tablet   Oral   Take 1 tablet (500 mg total) by mouth 2 (two) times daily.   20 tablet   0     BP 156/110  Pulse 106  Temp(Src) 99.6 F (37.6 C) (Oral)  Resp 18  SpO2 99%  Physical Exam  Nursing note and vitals reviewed. Constitutional: She is oriented to person, place, and time. She appears well-developed and well-nourished.  HENT:  Head: Normocephalic and atraumatic.  No midline c-spine tenderness, pt able to turn head to 45 degrees bilaterally without any pain and able to flex neck to the chest and extend without any pain or neurologic symptoms.   Eyes: EOM are normal. Pupils are equal, round, and reactive to light.  Neck: Neck supple.  Cardiovascular: Normal rate, regular rhythm and normal heart sounds.   No murmur heard. Pulmonary/Chest: Effort normal. No  respiratory distress.  Abdominal: Soft. She  exhibits no distension. There is no tenderness. There is no rebound and no guarding.  Musculoskeletal:  Head to toe evaluation shows no hematoma, bleeding of the scalp, no facial abrasions, step offs, crepitus, no tenderness to palpation of the bilateral upper and lower extremities, no gross deformities, no chest tenderness, no pelvic pain.  Neurological: She is alert and oriented to person, place, and time.  Skin: Skin is warm and dry.    ED Course  Procedures (including critical care time)  Labs Reviewed - No data to display Dg Lumbar Spine Complete  02/07/2013  *RADIOLOGY REPORT*  Clinical Data: Fall yesterday.  Low back and hip pain.  LUMBAR SPINE - COMPLETE 4+ VIEW  Comparison: Lumbar spine radiographs 04/16/2012.  Findings: Five non-rib bearing lumbar type vertebral bodies are present.  Advanced endplate degenerative changes at L3-4 L4-5 are stable.  Facet degenerative changes in the lower lumbar spine are stable.  Calcific densities near the left transverse process of L1 are stable.  These represent atherosclerotic calcifications of the left renal artery.  IMPRESSION:  1.  Stable moderate degenerative changes in the lower lumbar spine. 2.  No acute abnormality.   Original Report Authenticated By: Marin Roberts, M.D.    Dg Hip Complete Left  02/07/2013  *RADIOLOGY REPORT*  Clinical Data: Fall.  Low back pain.  Left hip pain.  LEFT HIP - COMPLETE 2+ VIEW  Comparison: None.  Findings: Degenerative changes are noted in the lower lumbar spine and SI joints.  The left hip is located.  No acute bone or soft tissue abnormalities are present.  IMPRESSION:  1.  Normal radiographs of the left hip. 2.  Degenerative changes of the lower lumbar spine and SI joints.   Original Report Authenticated By: Marin Roberts, M.D.    Dg Knee Complete 4 Views Left  02/07/2013  *RADIOLOGY REPORT*  Clinical Data: Fall.  Bilateral knee pain.  LEFT KNEE - COMPLETE 4+ VIEW  Comparison: Four views of the left  knee 11/25/2004  Findings: The left knee is located.  No acute bone or soft tissue abnormalities are present.  IMPRESSION: Negative left knee.   Original Report Authenticated By: Marin Roberts, M.D.    Dg Knee Complete 4 Views Right  02/07/2013  *RADIOLOGY REPORT*  Clinical Data: Fall yesterday.  Bilateral knee pain to  RIGHT KNEE - COMPLETE 4+ VIEW  Comparison: Left knee radiographs of the same day.  Findings: The right knee is located.  No acute bone or soft tissue abnormalities are present.  There is no significant effusion.  IMPRESSION: Negative right knee radiographs.   Original Report Authenticated By: Marin Roberts, M.D.      1. Fall at home, initial encounter   2. Contusion       MDM  DDx includes: - Mechanical falls - ICH - Fractures - Contusions - Soft tissue injury  Pt comes in with cc of fall. Pt fell about 36 hours ago. No LOC, no true headaches. No nausea, vomiting, visual complains, seizures, altered mental status, loss of consciousness, new weakness, or numbness, no gait instability. Cspine cleared clinically. Triage Radiographs are all negative.  Will discharge with community resources - as patient want PCP.  Derwood Kaplan, MD 02/07/13 [phone]

## 2013-02-07 NOTE — ED Notes (Signed)
Pt c/o headache,lower back pain, knee pain.  Pt stated that she tripped over her large dog early yesterday morning. Pt feels like her balance is decreased today. She stated that both knees struck the floor, l/side of head struck nightstand. Denies dizziness or LOC

## 2013-02-07 NOTE — ED Notes (Signed)
Pt in radiology at this time. 

## 2013-02-18 ENCOUNTER — Emergency Department (HOSPITAL_COMMUNITY)
Admission: EM | Admit: 2013-02-18 | Discharge: 2013-02-18 | Disposition: A | Discharge status: Home / Self Care | Payer: No Typology Code available for payment source | Attending: Emergency Medicine | Admitting: Emergency Medicine

## 2013-02-18 ENCOUNTER — Emergency Department (HOSPITAL_COMMUNITY): Payer: No Typology Code available for payment source

## 2013-02-18 ENCOUNTER — Encounter (HOSPITAL_COMMUNITY): Payer: Self-pay | Admitting: Emergency Medicine

## 2013-02-18 DIAGNOSIS — J45909 Unspecified asthma, uncomplicated: Secondary | ICD-10-CM | POA: Insufficient documentation

## 2013-02-18 DIAGNOSIS — S62502A Fracture of unspecified phalanx of left thumb, initial encounter for closed fracture: Secondary | ICD-10-CM

## 2013-02-18 DIAGNOSIS — Y9389 Activity, other specified: Secondary | ICD-10-CM | POA: Insufficient documentation

## 2013-02-18 DIAGNOSIS — Y92410 Unspecified street and highway as the place of occurrence of the external cause: Secondary | ICD-10-CM | POA: Insufficient documentation

## 2013-02-18 DIAGNOSIS — Z7982 Long term (current) use of aspirin: Secondary | ICD-10-CM | POA: Insufficient documentation

## 2013-02-18 DIAGNOSIS — Z79899 Other long term (current) drug therapy: Secondary | ICD-10-CM | POA: Insufficient documentation

## 2013-02-18 DIAGNOSIS — Z87891 Personal history of nicotine dependence: Secondary | ICD-10-CM | POA: Insufficient documentation

## 2013-02-18 DIAGNOSIS — Z85528 Personal history of other malignant neoplasm of kidney: Secondary | ICD-10-CM | POA: Insufficient documentation

## 2013-02-18 DIAGNOSIS — S39012A Strain of muscle, fascia and tendon of lower back, initial encounter: Secondary | ICD-10-CM

## 2013-02-18 DIAGNOSIS — Z791 Long term (current) use of non-steroidal anti-inflammatories (NSAID): Secondary | ICD-10-CM | POA: Insufficient documentation

## 2013-02-18 DIAGNOSIS — S62609A Fracture of unspecified phalanx of unspecified finger, initial encounter for closed fracture: Secondary | ICD-10-CM | POA: Insufficient documentation

## 2013-02-18 DIAGNOSIS — IMO0002 Reserved for concepts with insufficient information to code with codable children: Secondary | ICD-10-CM | POA: Insufficient documentation

## 2013-02-18 MED ORDER — HYDROCODONE-ACETAMINOPHEN 5-325 MG PO TABS: 2.0000 | ORAL_TABLET | Freq: Four times a day (QID) | ORAL | Status: DC | PRN

## 2013-02-18 MED ORDER — OXYCODONE-ACETAMINOPHEN 5-325 MG PO TABS
2.0000 | ORAL_TABLET | Freq: Once | ORAL | Status: AC
  Administered 2013-02-18: 2 via ORAL
  Filled 2013-02-18: qty 2

## 2013-02-18 MED ORDER — METHOCARBAMOL 500 MG PO TABS: 500.0000 mg | ORAL_TABLET | Freq: Two times a day (BID) | ORAL | Status: DC
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
## 2013-02-18 NOTE — Progress Notes (Signed)
Orthopedic Tech Progress Note Patient Details:  [name] 11/06/1946 [id number] Thumb spica splint applied to Left thumb/wrist. Tolerated well.  Ortho Devices Type of Ortho Device: Thumb spica splint Ortho Device/Splint Location: Left Ortho Device/Splint Interventions: Application   Asia R Thompson 02/18/2013, 4:37 PM
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
## 2013-02-18 NOTE — ED Notes (Signed)
Pt states she has a ride home. 

## 2013-02-18 NOTE — ED Provider Notes (Signed)
Medical screening examination/treatment/procedure(s) were performed by non-physician practitioner and as supervising physician I was immediately available for consultation/collaboration.  Gilda Crease, MD 02/18/13 [phone]

## 2013-02-18 NOTE — ED Notes (Signed)
Ortho tech called for application of thumb spica splint.

## 2013-02-18 NOTE — ED Notes (Signed)
Ortho called again for application of thumb spica splint.

## 2013-02-23 ENCOUNTER — Emergency Department (HOSPITAL_COMMUNITY)
Admission: EM | Admit: 2013-02-23 | Discharge: 2013-02-23 | Disposition: A | Discharge status: Home / Self Care | Payer: Medicaid Other | Attending: Emergency Medicine | Admitting: Emergency Medicine

## 2013-02-23 ENCOUNTER — Encounter (HOSPITAL_COMMUNITY): Payer: Self-pay | Admitting: *Deleted

## 2013-02-23 DIAGNOSIS — S62233A Other displaced fracture of base of first metacarpal bone, unspecified hand, initial encounter for closed fracture: Secondary | ICD-10-CM | POA: Insufficient documentation

## 2013-02-23 DIAGNOSIS — Z87891 Personal history of nicotine dependence: Secondary | ICD-10-CM | POA: Insufficient documentation

## 2013-02-23 DIAGNOSIS — S62202A Unspecified fracture of first metacarpal bone, left hand, initial encounter for closed fracture: Secondary | ICD-10-CM

## 2013-02-23 DIAGNOSIS — Y929 Unspecified place or not applicable: Secondary | ICD-10-CM | POA: Insufficient documentation

## 2013-02-23 DIAGNOSIS — Y939 Activity, unspecified: Secondary | ICD-10-CM | POA: Insufficient documentation

## 2013-02-23 MED ORDER — HYDROCODONE-ACETAMINOPHEN 10-325 MG PO TABS: 1.0000 | ORAL_TABLET | Freq: Four times a day (QID) | ORAL | Status: DC | PRN
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
## 2013-02-23 NOTE — ED Notes (Signed)
Discharge per Dr. Amanda Pea.
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
## 2013-02-23 NOTE — Progress Notes (Signed)
Orthopedic Tech Progress Note Patient Details:  [name] 1946-07-28 [id number]  Patient ID: [name], female   DOB: 11-30-1945, 67 y.o.   MRN: [id number]   [name] 02/23/2013, 5:26 PM  Short arm cast
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
## 2013-04-21 ENCOUNTER — Other Ambulatory Visit (HOSPITAL_COMMUNITY): Payer: Self-pay | Admitting: Internal Medicine

## 2013-04-21 ENCOUNTER — Ambulatory Visit (HOSPITAL_COMMUNITY)
Admission: RE | Admit: 2013-04-21 | Discharge: 2013-04-21 | Disposition: A | Discharge status: Home / Self Care | Payer: Medicaid Other | Source: Ambulatory Visit | Attending: Internal Medicine | Admitting: Internal Medicine

## 2013-04-21 DIAGNOSIS — M25559 Pain in unspecified hip: Secondary | ICD-10-CM | POA: Insufficient documentation

## 2013-04-21 DIAGNOSIS — R52 Pain, unspecified: Secondary | ICD-10-CM

## 2013-04-21 DIAGNOSIS — M5137 Other intervertebral disc degeneration, lumbosacral region: Secondary | ICD-10-CM | POA: Insufficient documentation

## 2013-04-21 DIAGNOSIS — M51379 Other intervertebral disc degeneration, lumbosacral region without mention of lumbar back pain or lower extremity pain: Secondary | ICD-10-CM | POA: Insufficient documentation

## 2013-08-07 ENCOUNTER — Encounter (HOSPITAL_COMMUNITY): Payer: Self-pay

## 2013-08-07 ENCOUNTER — Emergency Department (HOSPITAL_COMMUNITY)
Admission: EM | Admit: 2013-08-07 | Discharge: 2013-08-07 | Disposition: A | Discharge status: Home / Self Care | Payer: Medicare Other | Attending: Emergency Medicine | Admitting: Emergency Medicine

## 2013-08-07 ENCOUNTER — Emergency Department (HOSPITAL_COMMUNITY): Payer: Medicare Other

## 2013-08-07 DIAGNOSIS — Z79899 Other long term (current) drug therapy: Secondary | ICD-10-CM | POA: Insufficient documentation

## 2013-08-07 DIAGNOSIS — J45901 Unspecified asthma with (acute) exacerbation: Secondary | ICD-10-CM

## 2013-08-07 DIAGNOSIS — M545 Low back pain, unspecified: Secondary | ICD-10-CM | POA: Insufficient documentation

## 2013-08-07 DIAGNOSIS — M129 Arthropathy, unspecified: Secondary | ICD-10-CM | POA: Insufficient documentation

## 2013-08-07 DIAGNOSIS — IMO0002 Reserved for concepts with insufficient information to code with codable children: Secondary | ICD-10-CM | POA: Insufficient documentation

## 2013-08-07 DIAGNOSIS — J40 Bronchitis, not specified as acute or chronic: Secondary | ICD-10-CM

## 2013-08-07 DIAGNOSIS — R000 Tachycardia, unspecified: Secondary | ICD-10-CM | POA: Insufficient documentation

## 2013-08-07 DIAGNOSIS — F17200 Nicotine dependence, unspecified, uncomplicated: Secondary | ICD-10-CM | POA: Insufficient documentation

## 2013-08-07 DIAGNOSIS — R112 Nausea with vomiting, unspecified: Secondary | ICD-10-CM | POA: Insufficient documentation

## 2013-08-07 DIAGNOSIS — Z7982 Long term (current) use of aspirin: Secondary | ICD-10-CM | POA: Insufficient documentation

## 2013-08-07 DIAGNOSIS — Z85528 Personal history of other malignant neoplasm of kidney: Secondary | ICD-10-CM | POA: Insufficient documentation

## 2013-08-07 HISTORY — DX: Unspecified osteoarthritis, unspecified site: M19.90

## 2013-08-07 LAB — CBC
HCT: 32.7 % — ABNORMAL LOW (ref 36.0–46.0)
Hemoglobin: 11.1 g/dL — ABNORMAL LOW (ref 12.0–15.0)
MCH: 31.8 pg (ref 26.0–34.0)
MCHC: 33.9 g/dL (ref 30.0–36.0)
MCV: 93.7 fL (ref 78.0–100.0)

## 2013-08-07 LAB — BASIC METABOLIC PANEL
BUN: 9 mg/dL (ref 6–23)
Calcium: 8.6 mg/dL (ref 8.4–10.5)
Creatinine, Ser: 0.65 mg/dL (ref 0.50–1.10)
GFR calc non Af Amer: >90 mL/min (ref >90)
Glucose, Bld: 113 mg/dL — ABNORMAL HIGH (ref 70–99)
Potassium: 4.2 mEq/L (ref 3.5–5.1)

## 2013-08-07 LAB — POCT I-STAT TROPONIN I

## 2013-08-07 MED ORDER — SIMVASTATIN 40 MG PO TABS: 40.0000 mg | ORAL_TABLET | Freq: Every evening | ORAL | Status: DC

## 2013-08-07 MED ORDER — ALBUTEROL SULFATE HFA 108 (90 BASE) MCG/ACT IN AERS
2.0000 | INHALATION_SPRAY | Freq: Once | RESPIRATORY_TRACT | Status: AC
  Administered 2013-08-07: 2 via RESPIRATORY_TRACT
  Filled 2013-08-07: qty 6.7

## 2013-08-07 MED ORDER — ALBUTEROL SULFATE (5 MG/ML) 0.5% IN NEBU: 5.0000 mg | INHALATION_SOLUTION | Freq: Once | RESPIRATORY_TRACT | Status: AC

## 2013-08-07 MED ORDER — AMOXICILLIN 500 MG PO CAPS: 500.0000 mg | ORAL_CAPSULE | Freq: Three times a day (TID) | ORAL | Status: DC

## 2013-08-07 MED ORDER — MONTELUKAST SODIUM 10 MG PO TABS: 10.0000 mg | ORAL_TABLET | Freq: Every day | ORAL | Status: DC

## 2013-08-07 MED ORDER — EZETIMIBE 10 MG PO TABS: 10.0000 mg | ORAL_TABLET | Freq: Every day | ORAL | Status: DC

## 2013-08-07 MED ORDER — FLUTICASONE-SALMETEROL 500-50 MCG/DOSE IN AEPB: 1.0000 | INHALATION_SPRAY | Freq: Two times a day (BID) | RESPIRATORY_TRACT | Status: DC

## 2013-08-07 MED ORDER — HYDROXYZINE PAMOATE 25 MG PO CAPS: 25.0000 mg | ORAL_CAPSULE | Freq: Three times a day (TID) | ORAL | Status: DC | PRN

## 2013-08-07 MED ORDER — METHYLPREDNISOLONE SODIUM SUCC 125 MG IJ SOLR
125.0000 mg | Freq: Once | INTRAMUSCULAR | Status: AC
  Administered 2013-08-07: 125 mg via INTRAVENOUS
  Filled 2013-08-07: qty 2

## 2013-08-07 MED ORDER — ALBUTEROL SULFATE (5 MG/ML) 0.5% IN NEBU
5.0000 mg | INHALATION_SOLUTION | Freq: Once | RESPIRATORY_TRACT | Status: AC
  Administered 2013-08-07: 5 mg via RESPIRATORY_TRACT
  Filled 2013-08-07: qty 1

## 2013-08-07 MED ORDER — PREDNISONE 20 MG PO TABS: ORAL_TABLET | ORAL | Status: DC

## 2013-08-07 MED ORDER — CELECOXIB 200 MG PO CAPS: 200.0000 mg | ORAL_CAPSULE | Freq: Every day | ORAL | Status: DC

## 2013-08-07 MED ORDER — MORPHINE SULFATE 4 MG/ML IJ SOLN
4.0000 mg | Freq: Once | INTRAMUSCULAR | Status: AC
  Administered 2013-08-07: 4 mg via INTRAVENOUS
  Filled 2013-08-07: qty 1

## 2013-08-07 MED ORDER — CITALOPRAM HYDROBROMIDE 10 MG PO TABS: 10.0000 mg | ORAL_TABLET | Freq: Every day | ORAL | Status: DC

## 2013-08-07 MED ORDER — IPRATROPIUM BROMIDE 0.02 % IN SOLN: 0.5000 mg | Freq: Once | RESPIRATORY_TRACT | Status: DC

## 2013-08-07 MED ORDER — FAMOTIDINE 20 MG PO TABS: 20.0000 mg | ORAL_TABLET | Freq: Two times a day (BID) | ORAL | Status: DC

## 2013-08-07 MED ORDER — ONDANSETRON HCL 4 MG/2ML IJ SOLN
4.0000 mg | Freq: Once | INTRAMUSCULAR | Status: AC
  Administered 2013-08-07: 4 mg via INTRAVENOUS
  Filled 2013-08-07: qty 2

## 2013-08-07 MED ORDER — IPRATROPIUM BROMIDE 0.02 % IN SOLN
0.5000 mg | Freq: Once | RESPIRATORY_TRACT | Status: AC
  Administered 2013-08-07: 0.5 mg via RESPIRATORY_TRACT
  Filled 2013-08-07: qty 2.5

## 2013-08-07 NOTE — ED Provider Notes (Signed)
CSN: [id number]     Arrival date & time 08/07/13  1307 History   First MD Initiated Contact with Patient 08/07/13 1324     Chief Complaint  Patient presents with  . Shortness of Breath   (Consider location/radiation/quality/duration/timing/severity/associated sxs/prior Treatment) HPI Comments: 67 y/o female with a PMHx of asthma, renal cell carcinoma and arthritis presents to the ED complaining of shortness of breath for the past 4 days. SOB is both at rest and on exertion. Admits to associated productive cough with yellow phlegm. States she has a pain in her back with deep inspiration. Admits to associated nausea, vomiting, and low back pain. Relates her back pain to her chronic arthritis along with generalized body aches and pains. She has been out of her medications for the past 7 days and could not get them refilled due to money issues. Denies fever or chills. She has not had any alleviating factors for her symptoms. Denies chest pain.  The history is provided by the patient and the spouse.    Past Medical History  Diagnosis Date  . Asthma   . Renal cell carcinoma 2012    left  . Arthritis    Past Surgical History  Procedure Laterality Date  . Kidney surgery  12/2010    [facility]; partial nephrectomy  . Hernia repair  07/2011    supraumbilical repair  . Bunionectomy  04/2011  . Tubal ligation    . Abdominal hysterectomy    . Menisectomy      left knee  . Cholecystectomy  11/13/2011    Procedure: LAPAROSCOPIC CHOLECYSTECTOMY WITH INTRAOPERATIVE CHOLANGIOGRAM;  Surgeon: Rulon Abide, DO;  Location: [facility];  Service: General;  Laterality: N/A;   No family history on file. History  Substance Use Topics  . Smoking status: Current Every Day Smoker -- 0.25 packs/day    Types: Cigarettes  . Smokeless tobacco: Never Used  . Alcohol Use: No     Comment: socially   OB History   Grav Para Term Preterm Abortions TAB SAB Ect Mult Living                 Review of  Systems  Allergies  Azithromycin  Home Medications   Current Outpatient Rx  Name  Route  Sig  Dispense  Refill  . albuterol (PROVENTIL HFA;VENTOLIN HFA) 108 (90 BASE) MCG/ACT inhaler   Inhalation   Inhale 2 puffs into the lungs every 6 (six) hours as needed for wheezing or shortness of breath.          Marland Kitchen aspirin EC 81 MG tablet   Oral   Take 81 mg by mouth daily.          . celecoxib (CELEBREX) 200 MG capsule   Oral   Take 200 mg by mouth daily.         . citalopram (CELEXA) 10 MG tablet   Oral   Take 10 mg by mouth daily.         Marland Kitchen estrogens, conjugated, (PREMARIN) 1.25 MG tablet   Oral   Take 1.25 mg by mouth daily.         Marland Kitchen ezetimibe (ZETIA) 10 MG tablet   Oral   Take 10 mg by mouth daily.           . famotidine (PEPCID) 20 MG tablet   Oral   Take 20 mg by mouth 2 (two) times daily.           . fluticasone (VERAMYST) 27.5  MCG/SPRAY nasal spray   Nasal   Place 2 sprays into the nose daily.         . Fluticasone-Salmeterol (ADVAIR) 500-50 MCG/DOSE AEPB   Inhalation   Inhale 1 puff into the lungs every 12 (twelve) hours.          . hydrOXYzine (VISTARIL) 25 MG capsule   Oral   Take 25 mg by mouth 3 (three) times daily as needed for itching.         . levocetirizine (XYZAL) 5 MG tablet   Oral   Take 5 mg by mouth daily.          . methocarbamol (ROBAXIN) 500 MG tablet   Oral   Take 500 mg by mouth 2 (two) times daily as needed (for muscle spasms).          . montelukast (SINGULAIR) 10 MG tablet   Oral   Take 10 mg by mouth daily.           . Multiple Vitamin (MULITIVITAMIN WITH MINERALS) TABS   Oral   Take 1 tablet by mouth daily.           . simvastatin (ZOCOR) 40 MG tablet   Oral   Take 40 mg by mouth every evening.         . triamcinolone (NASACORT) 55 MCG/ACT nasal inhaler   Nasal   Place 2 sprays into the nose daily as needed (for allergies).           BP 160/103  Pulse 104  Temp(Src) 99.2 F (37.3 C)  (Oral)  Resp 24  Ht 5\' 6"  (1.676 m)  Wt 152 lb (68.947 kg)  BMI 24.55 kg/m2  SpO2 100% Physical Exam  Nursing note and vitals reviewed. Constitutional: She is oriented to person, place, and time. She appears well-developed and well-nourished. No distress.  HENT:  Head: Normocephalic and atraumatic.  Mouth/Throat: Oropharynx is clear and moist.  Eyes: Conjunctivae and EOM are normal. Pupils are equal, round, and reactive to light.  Neck: Normal range of motion. Neck supple.  Cardiovascular: Regular rhythm and normal heart sounds.  Tachycardia present.   Pulmonary/Chest: Tachypnea noted. She has wheezes (scattered expiratory). She has no rhonchi. She has no rales. She exhibits no tenderness.  Poor air movement.  Abdominal: Soft. Bowel sounds are normal. There is no tenderness.  Musculoskeletal: Normal range of motion. She exhibits no edema.       Thoracic back: Normal.       Lumbar back: Normal.  Neurological: She is alert and oriented to person, place, and time. She has normal strength. No sensory deficit.  Skin: Skin is warm and dry. She is not diaphoretic.  Psychiatric: She has a normal mood and affect. Her behavior is normal.    ED Course  Procedures (including critical care time) Labs Review Labs Reviewed  CBC - Abnormal; Notable for the following:    RBC 3.49 (*)    Hemoglobin 11.1 (*)    HCT 32.7 (*)    RDW 16.2 (*)    All other components within normal limits  BASIC METABOLIC PANEL - Abnormal; Notable for the following:    Sodium 132 (*)    Chloride 93 (*)    Glucose, Bld 113 (*)    All other components within normal limits  D-DIMER, QUANTITATIVE  POCT I-STAT TROPONIN I    Date: 08/07/2013  Rate: 102  Rhythm: sinus tachycardia  QRS Axis: left  Intervals: normal  ST/T Wave abnormalities: normal  Conduction Disutrbances:none  Narrative Interpretation: no stemi, other than tachycardia, no sig changes since EKG obtained on 12/12/2012  Old EKG Reviewed:  unchanged   Imaging Review Dg Chest 2 View  08/07/2013   CLINICAL DATA:  Shortness of breath.  EXAM: CHEST  2 VIEW  COMPARISON:  12/12/2012  FINDINGS: The heart size and mediastinal contours are within normal limits. Both lungs are clear. The visualized skeletal structures are unremarkable.  IMPRESSION: No active cardiopulmonary disease.   Electronically Signed   By: Myles Rosenthal   On: 08/07/2013 13:41    MDM   1. Asthma exacerbation   2. Bronchitis    Patient with cough, sob, wheezing, back pain. She appeared uncomfortable initially. Tachycardic, tachypneaic, expiratory wheezes. O2 sat 100% on RA. Duo-neb given with some relief of her wheezing, morphine, zofran, solumedrol also given with great relief. After stated treatment, no longer tachypneaic. Has been out of her medications for 7 days. Labs ordered initially- cbc, bmp, troponin, d-dimer as she did not meet PERC criteria, mild leukocytosis, otherwise unremarkable. CXR clear. EKG without any sig changes. Symptoms most likely bronchitis/bronchospasm. Given she is a smoker, will treat with antibiotics, abuterol inhaler and short course of prednisone. Home med prescriptions given. Return precautions discussed. She will f/u with her PCP. Patient states understanding of treatment care plan and is agreeable. Case discussed with my attending Dr. Denton Lank who also evaluated patient and agrees with plan of care.     Trevor Mace, PA-C 08/07/13 1536

## 2013-08-07 NOTE — ED Notes (Signed)
MD at bedside. 

## 2013-08-07 NOTE — ED Notes (Signed)
Shortness of breath x 4 days.  States she's been in her bed for the last 4 days b/c of her arthritis pains and weather change as well.

## 2013-08-09 NOTE — Progress Notes (Signed)
   CARE MANAGEMENT ED NOTE 08/09/2013  Patient:  [name], [name]   Account Number:  [id number]  Date Initiated:  08/09/2013  Documentation initiated by:  Edd Arbour  Subjective/Objective Assessment:   67 year old female medicare pt seen @ [facility] ED 08/07/13 call for medication assistance Reports being in the medicare donut hole &  medication cost $100-$500 Confirms pcp is [name] @ family medicine at [place] without meds for 9 days     Subjective/Objective Assessment Detail:   [facility] on [address] [place] is preferred pharmacy     Action/Plan:   [facility] ED CM spoke with pt, consulted with CM peers x 3, spoke with pcp office, reviewed med list, identified low cost meds that can be purchased using [facility] $4 list Spoke with EDP, campos for possible assistance in change in 08/07/13 d/c meds   Action/Plan Detail:   Pending call back from [place], [facility] medicine @ [place] RN for possible assistance Pam at family med at [place] - had not been seen by Dr August Saucer but once in June 2014 and after that point pt has various noted cancelled and no show appointments   Anticipated DC Date:  08/07/2013     Status Recommendation to Physician:   Result of Recommendation:    Other ED Services  Consult Working Plan    DC Planning Services  Other  Medication Assistance  CM consult  [facility]  Outpatient Services - Pt will follow up    Choice offered to / List presented to:            Status of service:  Completed, signed off  ED Comments:   ED Comments Detail:  08/09/13 1203 Returned a call to the pt and informed her she did not qualify for [facility] MATCH program Discussed with her the contact calls to Dr Diamantina Providence RN , EDP (campos), Review of medication list and informed her she could continue use of the Advair inhaler provided at d/c on 08/07/13 and could purchase the following medications using [facility] $4 plan : Amoxicillin, prednisone, pepcid and Celexa Made her aware that EDP encourage use of  amoxicillin, prednisone and Advair until 08/19/13 appointment with Dr August Saucer, pcp and Dr August Saucer can change further medications to possible generics or alternative meds to assist with cost. Campos noted in [facility] pt was provided a courtesy renewal of her Rx initiated by her pcp. CM encouraged her to attend her scheduled 08/19/13 appointment with Dr August Saucer so that the family medicine at [place] [phone]) RN can further assist her.  Cm informed her that if a return call is received she will call the pt to provide information if samples are available for her at [facility] medicine at [place].  Provided her with the information about the health department MAP (medication assistance program) and provided the number for SSA (social security administration) Extra Help With Medicare Prescription Drug Plan Costs medication assistance program number as 1 [phone] Pt repeated the contact number back to the CM correctly to confirm she received it  Pt requests some patient assistance applications be mailed to her to have prior to her visiting health department for MAP

## 2013-08-10 NOTE — ED Provider Notes (Signed)
Medical screening examination/treatment/procedure(s) were conducted as a shared visit with non-physician practitioner(s) and myself.  I personally evaluated the patient during the encounter Pt w hx asthma, smoker, c/o non prod cough and increased wheezing. Wheezing on exam. Albuterol neb. Cxr.     Suzi Roots, MD 08/10/13 [phone]

## 2013-09-10 ENCOUNTER — Encounter (HOSPITAL_COMMUNITY): Payer: Self-pay | Admitting: Emergency Medicine

## 2013-09-10 ENCOUNTER — Inpatient Hospital Stay (HOSPITAL_COMMUNITY)
Admission: EM | Admit: 2013-09-10 | Discharge: 2013-09-14 | DRG: 872 | Disposition: A | Discharge status: Home / Self Care | Payer: Medicare Other | Attending: Internal Medicine | Admitting: Internal Medicine

## 2013-09-10 ENCOUNTER — Emergency Department (HOSPITAL_COMMUNITY): Payer: Medicare Other

## 2013-09-10 DIAGNOSIS — Z85528 Personal history of other malignant neoplasm of kidney: Secondary | ICD-10-CM | POA: Diagnosis present

## 2013-09-10 DIAGNOSIS — N179 Acute kidney failure, unspecified: Secondary | ICD-10-CM | POA: Diagnosis present

## 2013-09-10 DIAGNOSIS — N12 Tubulo-interstitial nephritis, not specified as acute or chronic: Secondary | ICD-10-CM

## 2013-09-10 DIAGNOSIS — N390 Urinary tract infection, site not specified: Secondary | ICD-10-CM | POA: Diagnosis present

## 2013-09-10 DIAGNOSIS — R7401 Elevation of levels of liver transaminase levels: Secondary | ICD-10-CM | POA: Diagnosis present

## 2013-09-10 DIAGNOSIS — Z9089 Acquired absence of other organs: Secondary | ICD-10-CM

## 2013-09-10 DIAGNOSIS — F329 Major depressive disorder, single episode, unspecified: Secondary | ICD-10-CM | POA: Diagnosis present

## 2013-09-10 DIAGNOSIS — E785 Hyperlipidemia, unspecified: Secondary | ICD-10-CM | POA: Diagnosis present

## 2013-09-10 DIAGNOSIS — N10 Acute tubulo-interstitial nephritis: Secondary | ICD-10-CM | POA: Diagnosis present

## 2013-09-10 DIAGNOSIS — D638 Anemia in other chronic diseases classified elsewhere: Secondary | ICD-10-CM | POA: Diagnosis present

## 2013-09-10 DIAGNOSIS — J45909 Unspecified asthma, uncomplicated: Secondary | ICD-10-CM | POA: Diagnosis present

## 2013-09-10 DIAGNOSIS — E878 Other disorders of electrolyte and fluid balance, not elsewhere classified: Secondary | ICD-10-CM

## 2013-09-10 DIAGNOSIS — E871 Hypo-osmolality and hyponatremia: Secondary | ICD-10-CM | POA: Diagnosis present

## 2013-09-10 DIAGNOSIS — A419 Sepsis, unspecified organism: Principal | ICD-10-CM | POA: Diagnosis present

## 2013-09-10 DIAGNOSIS — D72829 Elevated white blood cell count, unspecified: Secondary | ICD-10-CM

## 2013-09-10 DIAGNOSIS — R197 Diarrhea, unspecified: Secondary | ICD-10-CM | POA: Diagnosis present

## 2013-09-10 DIAGNOSIS — Z905 Acquired absence of kidney: Secondary | ICD-10-CM

## 2013-09-10 DIAGNOSIS — F3289 Other specified depressive episodes: Secondary | ICD-10-CM | POA: Diagnosis present

## 2013-09-10 DIAGNOSIS — D649 Anemia, unspecified: Secondary | ICD-10-CM | POA: Diagnosis present

## 2013-09-10 DIAGNOSIS — E875 Hyperkalemia: Secondary | ICD-10-CM | POA: Diagnosis not present

## 2013-09-10 DIAGNOSIS — R7402 Elevation of levels of lactic acid dehydrogenase (LDH): Secondary | ICD-10-CM | POA: Diagnosis present

## 2013-09-10 DIAGNOSIS — E876 Hypokalemia: Secondary | ICD-10-CM | POA: Diagnosis present

## 2013-09-10 DIAGNOSIS — E860 Dehydration: Secondary | ICD-10-CM

## 2013-09-10 DIAGNOSIS — F17200 Nicotine dependence, unspecified, uncomplicated: Secondary | ICD-10-CM | POA: Diagnosis present

## 2013-09-10 LAB — COMPREHENSIVE METABOLIC PANEL
ALT: 18 U/L (ref 0–35)
AST: 89 U/L — ABNORMAL HIGH (ref 0–37)
Albumin: 2.4 g/dL — ABNORMAL LOW (ref 3.5–5.2)
Alkaline Phosphatase: 498 U/L — ABNORMAL HIGH (ref 39–117)
BUN: 9 mg/dL (ref 6–23)
CO2: 24 mEq/L (ref 19–32)
Calcium: 6.7 mg/dL — ABNORMAL LOW (ref 8.4–10.5)
Chloride: 89 mEq/L — ABNORMAL LOW (ref 96–112)
Creatinine, Ser: 1.24 mg/dL — ABNORMAL HIGH (ref 0.50–1.10)
GFR calc Af Amer: 52 mL/min — ABNORMAL LOW (ref >90)
GFR calc non Af Amer: 45 mL/min — ABNORMAL LOW (ref >90)
Glucose, Bld: 90 mg/dL (ref 70–99)
Potassium: 3 mEq/L — ABNORMAL LOW (ref 3.5–5.1)
Sodium: 128 mEq/L — ABNORMAL LOW (ref 135–145)
Total Bilirubin: 1.4 mg/dL — ABNORMAL HIGH (ref 0.3–1.2)
Total Protein: 7.3 g/dL (ref 6.0–8.3)

## 2013-09-10 LAB — URINALYSIS, ROUTINE W REFLEX MICROSCOPIC
Glucose, UA: NEGATIVE mg/dL
Hgb urine dipstick: NEGATIVE
Ketones, ur: NEGATIVE mg/dL
Nitrite: NEGATIVE
Protein, ur: 30 mg/dL — AB
Specific Gravity, Urine: 1.038 — ABNORMAL HIGH (ref 1.005–1.030)
Urobilinogen, UA: 0.2 mg/dL (ref 0.0–1.0)
pH: 5 (ref 5.0–8.0)

## 2013-09-10 LAB — URINE MICROSCOPIC-ADD ON

## 2013-09-10 LAB — CBC WITH DIFFERENTIAL/PLATELET
Basophils Absolute: 0 10*3/uL (ref 0.0–0.1)
Basophils Relative: 0 % (ref 0–1)
Eosinophils Absolute: 0.3 10*3/uL (ref 0.0–0.7)
Eosinophils Relative: 1 % (ref 0–5)
HCT: 28.5 % — ABNORMAL LOW (ref 36.0–46.0)
Hemoglobin: 9.6 g/dL — ABNORMAL LOW (ref 12.0–15.0)
Lymphocytes Relative: 6 % — ABNORMAL LOW (ref 12–46)
Lymphs Abs: 1.6 10*3/uL (ref 0.7–4.0)
MCH: 33.3 pg (ref 26.0–34.0)
MCHC: 33.7 g/dL (ref 30.0–36.0)
MCV: 99 fL (ref 78.0–100.0)
Monocytes Absolute: 1.3 10*3/uL — ABNORMAL HIGH (ref 0.1–1.0)
Monocytes Relative: 5 % (ref 3–12)
Neutro Abs: 23.4 10*3/uL — ABNORMAL HIGH (ref 1.7–7.7)
Neutrophils Relative %: 88 % — ABNORMAL HIGH (ref 43–77)
Platelets: 297 10*3/uL (ref 150–400)
RBC: 2.88 MIL/uL — ABNORMAL LOW (ref 3.87–5.11)
RDW: 19.8 % — ABNORMAL HIGH (ref 11.5–15.5)
WBC Morphology: INCREASED
WBC: 26.6 10*3/uL — ABNORMAL HIGH (ref 4.0–10.5)

## 2013-09-10 LAB — LIPASE, BLOOD: Lipase: 7 U/L — ABNORMAL LOW (ref 11–59)

## 2013-09-10 MED ORDER — POTASSIUM CHLORIDE CRYS ER 20 MEQ PO TBCR
60.0000 meq | EXTENDED_RELEASE_TABLET | Freq: Once | ORAL | Status: AC
  Administered 2013-09-10: 60 meq via ORAL
  Filled 2013-09-10: qty 3

## 2013-09-10 MED ORDER — ZOLPIDEM TARTRATE 5 MG PO TABS
5.0000 mg | ORAL_TABLET | Freq: Every evening | ORAL | Status: DC | PRN
  Administered 2013-09-10 – 2013-09-13 (×3): 5 mg via ORAL
  Filled 2013-09-10 (×3): qty 1

## 2013-09-10 MED ORDER — IOHEXOL 300 MG/ML  SOLN
50.0000 mL | Freq: Once | INTRAMUSCULAR | Status: AC | PRN
  Administered 2013-09-10: 50 mL via ORAL

## 2013-09-10 MED ORDER — ALBUTEROL SULFATE HFA 108 (90 BASE) MCG/ACT IN AERS
2.0000 | INHALATION_SPRAY | Freq: Four times a day (QID) | RESPIRATORY_TRACT | Status: DC | PRN
  Filled 2013-09-10: qty 6.7

## 2013-09-10 MED ORDER — ONDANSETRON HCL 4 MG PO TABS: 4.0000 mg | ORAL_TABLET | Freq: Four times a day (QID) | ORAL | Status: DC | PRN

## 2013-09-10 MED ORDER — SIMVASTATIN 40 MG PO TABS
40.0000 mg | ORAL_TABLET | Freq: Every evening | ORAL | Status: DC
  Administered 2013-09-10 – 2013-09-13 (×4): 40 mg via ORAL
  Filled 2013-09-10 (×5): qty 1

## 2013-09-10 MED ORDER — IOHEXOL 300 MG/ML  SOLN
80.0000 mL | Freq: Once | INTRAMUSCULAR | Status: AC | PRN
  Administered 2013-09-10: 80 mL via INTRAVENOUS

## 2013-09-10 MED ORDER — ASPIRIN EC 81 MG PO TBEC
81.0000 mg | DELAYED_RELEASE_TABLET | Freq: Every day | ORAL | Status: DC
  Administered 2013-09-10 – 2013-09-14 (×5): 81 mg via ORAL
  Filled 2013-09-10 (×5): qty 1

## 2013-09-10 MED ORDER — FLUTICASONE FUROATE 27.5 MCG/SPRAY NA SUSP: 2.0000 | Freq: Every day | NASAL | Status: DC

## 2013-09-10 MED ORDER — HYDROXYZINE PAMOATE 25 MG PO CAPS
25.0000 mg | ORAL_CAPSULE | Freq: Three times a day (TID) | ORAL | Status: DC | PRN
  Filled 2013-09-10: qty 1

## 2013-09-10 MED ORDER — ESTROGENS CONJUGATED 1.25 MG PO TABS
1.2500 mg | ORAL_TABLET | Freq: Every day | ORAL | Status: DC
  Administered 2013-09-10 – 2013-09-14 (×5): 1.25 mg via ORAL
  Filled 2013-09-10 (×5): qty 1

## 2013-09-10 MED ORDER — MORPHINE SULFATE 4 MG/ML IJ SOLN
6.0000 mg | Freq: Once | INTRAMUSCULAR | Status: AC
  Administered 2013-09-10: 6 mg via INTRAVENOUS
  Filled 2013-09-10: qty 2

## 2013-09-10 MED ORDER — EZETIMIBE 10 MG PO TABS
10.0000 mg | ORAL_TABLET | Freq: Every day | ORAL | Status: DC
  Administered 2013-09-10 – 2013-09-14 (×5): 10 mg via ORAL
  Filled 2013-09-10 (×5): qty 1

## 2013-09-10 MED ORDER — POTASSIUM CHLORIDE CRYS ER 20 MEQ PO TBCR
40.0000 meq | EXTENDED_RELEASE_TABLET | ORAL | Status: AC
  Administered 2013-09-10 – 2013-09-11 (×3): 40 meq via ORAL
  Filled 2013-09-10 (×3): qty 2

## 2013-09-10 MED ORDER — MONTELUKAST SODIUM 10 MG PO TABS
10.0000 mg | ORAL_TABLET | Freq: Every day | ORAL | Status: DC
  Administered 2013-09-10 – 2013-09-14 (×5): 10 mg via ORAL
  Filled 2013-09-10 (×5): qty 1

## 2013-09-10 MED ORDER — METHOCARBAMOL 500 MG PO TABS
500.0000 mg | ORAL_TABLET | Freq: Two times a day (BID) | ORAL | Status: DC | PRN
  Administered 2013-09-11: 11:00:00 500 mg via ORAL
  Filled 2013-09-10: qty 1

## 2013-09-10 MED ORDER — FLUTICASONE PROPIONATE 50 MCG/ACT NA SUSP
2.0000 | Freq: Every day | NASAL | Status: DC
  Administered 2013-09-11 – 2013-09-14 (×4): 2 via NASAL
  Filled 2013-09-10: qty 16

## 2013-09-10 MED ORDER — SODIUM CHLORIDE 0.9 % IJ SOLN
3.0000 mL | Freq: Two times a day (BID) | INTRAMUSCULAR | Status: DC
  Administered 2013-09-11: 3 mL via INTRAVENOUS

## 2013-09-10 MED ORDER — FAMOTIDINE 20 MG PO TABS
20.0000 mg | ORAL_TABLET | Freq: Two times a day (BID) | ORAL | Status: DC
  Administered 2013-09-10 – 2013-09-14 (×8): 20 mg via ORAL
  Filled 2013-09-10 (×9): qty 1

## 2013-09-10 MED ORDER — ACETAMINOPHEN 325 MG PO TABS: 650.0000 mg | ORAL_TABLET | Freq: Four times a day (QID) | ORAL | Status: DC | PRN

## 2013-09-10 MED ORDER — ACETAMINOPHEN 650 MG RE SUPP: 650.0000 mg | Freq: Four times a day (QID) | RECTAL | Status: DC | PRN

## 2013-09-10 MED ORDER — DIPHENOXYLATE-ATROPINE 2.5-0.025 MG PO TABS
2.0000 | ORAL_TABLET | Freq: Once | ORAL | Status: AC
  Administered 2013-09-10: 2 via ORAL
  Filled 2013-09-10: qty 2

## 2013-09-10 MED ORDER — CELECOXIB 200 MG PO CAPS
200.0000 mg | ORAL_CAPSULE | Freq: Every day | ORAL | Status: DC
  Administered 2013-09-10 – 2013-09-14 (×5): 200 mg via ORAL
  Filled 2013-09-10 (×5): qty 1

## 2013-09-10 MED ORDER — ENOXAPARIN SODIUM 40 MG/0.4ML ~~LOC~~ SOLN
40.0000 mg | SUBCUTANEOUS | Status: DC
  Administered 2013-09-10: 40 mg via SUBCUTANEOUS
  Filled 2013-09-10 (×2): qty 0.4

## 2013-09-10 MED ORDER — SODIUM CHLORIDE 0.9 % IV SOLN
INTRAVENOUS | Status: DC
  Administered 2013-09-10 – 2013-09-14 (×7): via INTRAVENOUS

## 2013-09-10 MED ORDER — SODIUM CHLORIDE 0.9 % IV BOLUS (SEPSIS)
1000.0000 mL | Freq: Once | INTRAVENOUS | Status: AC
  Administered 2013-09-10: 1000 mL via INTRAVENOUS

## 2013-09-10 MED ORDER — MOMETASONE FURO-FORMOTEROL FUM 200-5 MCG/ACT IN AERO
2.0000 | INHALATION_SPRAY | Freq: Two times a day (BID) | RESPIRATORY_TRACT | Status: DC
  Administered 2013-09-10 – 2013-09-14 (×8): 2 via RESPIRATORY_TRACT
  Filled 2013-09-10: qty 8.8

## 2013-09-10 MED ORDER — ONDANSETRON HCL 4 MG/2ML IJ SOLN
4.0000 mg | Freq: Once | INTRAMUSCULAR | Status: AC
  Administered 2013-09-10: 4 mg via INTRAVENOUS
  Filled 2013-09-10: qty 2

## 2013-09-10 MED ORDER — CITALOPRAM HYDROBROMIDE 10 MG PO TABS
10.0000 mg | ORAL_TABLET | Freq: Every day | ORAL | Status: DC
  Administered 2013-09-10 – 2013-09-14 (×5): 10 mg via ORAL
  Filled 2013-09-10 (×5): qty 1

## 2013-09-10 MED ORDER — FLUTICASONE PROPIONATE 50 MCG/ACT NA SUSP: 1.0000 | Freq: Every day | NASAL | Status: DC

## 2013-09-10 MED ORDER — MORPHINE SULFATE 2 MG/ML IJ SOLN
1.0000 mg | INTRAMUSCULAR | Status: DC | PRN
  Administered 2013-09-11 (×2): 2 mg via INTRAVENOUS
  Administered 2013-09-13: 1 mg via INTRAVENOUS
  Filled 2013-09-10 (×3): qty 1

## 2013-09-10 MED ORDER — CEFTRIAXONE SODIUM 1 G IJ SOLR
1.0000 g | Freq: Once | INTRAMUSCULAR | Status: AC
  Administered 2013-09-10: 1 g via INTRAVENOUS
  Filled 2013-09-10: qty 10

## 2013-09-10 MED ORDER — ONDANSETRON HCL 4 MG/2ML IJ SOLN
4.0000 mg | Freq: Four times a day (QID) | INTRAMUSCULAR | Status: DC | PRN
  Filled 2013-09-10: qty 2

## 2013-09-10 MED ORDER — SODIUM CHLORIDE 0.9 % IV SOLN
INTRAVENOUS | Status: DC
  Administered 2013-09-10: 19:00:00 via INTRAVENOUS

## 2013-09-10 MED ORDER — DEXTROSE 5 % IV SOLN
1.0000 g | INTRAVENOUS | Status: DC
  Administered 2013-09-11 – 2013-09-13 (×3): 1 g via INTRAVENOUS
  Filled 2013-09-10 (×4): qty 10

## 2013-09-10 NOTE — ED Notes (Signed)
I got patients urine but stool was in the urine aswell, so I disregarded it and patient will call me when she has to go again

## 2013-09-10 NOTE — Progress Notes (Signed)
[facility] consulted by admission RN because patient has a difficult time getting her medications.  EDCM spoke to patient at bedside.  As per patient, she has a difficult time affording her medications.  Patient reports that one of her medications is over four hundred dollars.  Patient has [employer].  Patient reports, "I just signed up for the part with the medications."  Patient handed [facility] Medicare RX card.  Patient believes she is in a donut hole.  EDCM provide patient with information reagrding the donut hole with a phone to call for more information.  [facility] provided patient with an RX discount card.  Also provided patient with website needymeds.org for medication assistance.  Patient thankful for resources.  No further needs at this time.

## 2013-09-10 NOTE — ED Notes (Signed)
Pt states that she has had diarrhea for the past 3 days and everything she eats comes right back out.

## 2013-09-10 NOTE — ED Notes (Signed)
Attempted IV access x2, both unsuccessful. 

## 2013-09-10 NOTE — ED Notes (Signed)
Patient stated she would call when she had to urinate.

## 2013-09-10 NOTE — ED Notes (Signed)
Made [place] EDP aware of urine sample having stool in it. Verbally said was ok not to get urine.

## 2013-09-10 NOTE — H&P (Signed)
[facility] History and Physical  BAUDELIA SCHROEPFER RUE:[id number] DOB: [birth year] DOA: 09/10/2013  Referring physician: Tonny Bollman PCP: [name], MD  Specialists:   Chief Complaint: Abdominal pain and diarrhea  HPI: [name] is a 67 y.o. female with history of asthma and renal cell ca-status post partial nephrectomy who presents with complaints of abdominal pain and diarrhea for the past 3 days. She states that the abdominal pain has been diffuse, severe and associated with multiple episodes of nonbloody diarrhea. She denies nausea vomiting. It is noted that she has been on amoxicillin per her med rec although she denies been on any antibiotics. She denies any sick contacts. She was seen in the ED and urinalysis is consistent with a UTI, CT scan of abdomen and pelvis showed findings consistent with right sided pyelonephritis, and chemistries revealed electrolytes abnormalities and WBCs elevated at 26. Urine cultures were clean and she was started on empiric antibiotics and is admitted for further evaluation and management.   Review of Systems: The patient denies anorexia, fever, weight loss, vision loss, decreased hearing, hoarseness, chest pain, syncope, dyspnea on exertion, peripheral edema, balance deficits, hemoptysis, abdominal pain, melena, hematochezia, severe indigestion/heartburn, transient blindness, difficulty walking, depression, unusual weight change, abnormal bleeding, enlarged lymph   Past Medical History  Diagnosis Date  . Asthma   . Renal cell carcinoma 2012    left  . Arthritis    Past Surgical History  Procedure Laterality Date  . Kidney surgery  12/2010    [facility]; partial nephrectomy  . Hernia repair  07/2011    supraumbilical repair  . Bunionectomy  04/2011  . Tubal ligation    . Abdominal hysterectomy    . Menisectomy      left knee  . Cholecystectomy  11/13/2011    Procedure: LAPAROSCOPIC CHOLECYSTECTOMY WITH INTRAOPERATIVE CHOLANGIOGRAM;  Surgeon: Rulon Abide, DO;  Location: [facility];  Service: General;  Laterality: N/A;   Social History:  reports that she has been smoking Cigarettes.  She has been smoking about 0.25 packs per day. She has never used smokeless tobacco. She reports that she does not drink alcohol or use illicit drugs.  where does patient live--home  Can patient participate in ADLs-yes  Allergies  Allergen Reactions  . Azithromycin Itching and Swelling    REACTION: unspecified    History reviewed. No pertinent family history.   Prior to Admission medications   Medication Sig Start Date End Date Taking? Authorizing Provider  albuterol (PROVENTIL HFA;VENTOLIN HFA) 108 (90 BASE) MCG/ACT inhaler Inhale 2 puffs into the lungs every 6 (six) hours as needed. Wheezing and shortness of breath   Yes Historical Provider, MD  amoxicillin (AMOXIL) 500 MG capsule Take 500 mg by mouth 3 (three) times daily. 08/07/13  Yes Robyn Judeth Cornfield, PA-C  aspirin EC 81 MG tablet Take 81 mg by mouth daily.    Yes Historical Provider, MD  celecoxib (CELEBREX) 200 MG capsule Take 1 capsule (200 mg total) by mouth daily. 08/07/13  Yes Trevor Mace, PA-C  citalopram (CELEXA) 10 MG tablet Take 1 tablet (10 mg total) by mouth daily. 08/07/13  Yes Trevor Mace, PA-C  estrogens, conjugated, (PREMARIN) 1.25 MG tablet Take 1.25 mg by mouth daily.   Yes Historical Provider, MD  ezetimibe (ZETIA) 10 MG tablet Take 1 tablet (10 mg total) by mouth daily. 08/07/13  Yes Trevor Mace, PA-C  famotidine (PEPCID) 20 MG tablet Take 1 tablet (20 mg total) by mouth 2 (two) times  daily. 08/07/13  Yes Trevor Mace, PA-C  fluticasone (VERAMYST) 27.5 MCG/SPRAY nasal spray Place 2 sprays into the nose daily.   Yes Historical Provider, MD  Fluticasone-Salmeterol (ADVAIR) 500-50 MCG/DOSE AEPB Inhale 1 puff into the lungs every 12 (twelve) hours. 08/07/13  Yes Trevor Mace, PA-C  hydrOXYzine (VISTARIL) 25 MG capsule Take 1 capsule (25 mg total) by mouth 3 (three) times daily as  needed for itching. 08/07/13  Yes Trevor Mace, PA-C  levocetirizine (XYZAL) 5 MG tablet Take 5 mg by mouth daily.    Yes Historical Provider, MD  methocarbamol (ROBAXIN) 500 MG tablet Take 500 mg by mouth 2 (two) times daily as needed (for muscle spasms).  02/18/13  Yes Roxy Horseman, PA-C  montelukast (SINGULAIR) 10 MG tablet Take 1 tablet (10 mg total) by mouth daily. 08/07/13  Yes Trevor Mace, PA-C  simvastatin (ZOCOR) 40 MG tablet Take 1 tablet (40 mg total) by mouth every evening. 08/07/13  Yes Trevor Mace, PA-C  triamcinolone (NASACORT) 55 MCG/ACT nasal inhaler Place 2 sprays into the nose daily as needed (for allergies).    Yes Historical Provider, MD   Physical Exam: Filed Vitals:   09/10/13 1610  BP: 102/84  Pulse: 94  Temp: 98.2 F (36.8 C)  Resp: 19     Labs on Admission:  Basic Metabolic Panel:  Recent Labs Lab 09/10/13 1340  NA 128*  K 3.0*  CL 89*  CO2 24  GLUCOSE 90  BUN 9  CREATININE 1.24*  CALCIUM 6.7*   Liver Function Tests:  Recent Labs Lab 09/10/13 1340  AST 89*  ALT 18  ALKPHOS 498*  BILITOT 1.4*  PROT 7.3  ALBUMIN 2.4*    Recent Labs Lab 09/10/13 1340  LIPASE 7*   No results found for this basename: AMMONIA,  in the last 168 hours CBC:  Recent Labs Lab 09/10/13 1340  WBC 26.6*  NEUTROABS 23.4*  HGB 9.6*  HCT 28.5*  MCV 99.0  PLT 297   Cardiac Enzymes: No results found for this basename: CKTOTAL, CKMB, CKMBINDEX, TROPONINI,  in the last 168 hours  BNP (last 3 results) No results found for this basename: PROBNP,  in the last 8760 hours CBG: No results found for this basename: GLUCAP,  in the last 168 hours  Radiological Exams on Admission: Ct Abdomen Pelvis W Contrast  09/10/2013   CLINICAL DATA:  Diarrhea.  History of renal cell carcinoma.  EXAM: CT ABDOMEN AND PELVIS WITH CONTRAST  TECHNIQUE: Multidetector CT imaging of the abdomen and pelvis was performed using the standard protocol following bolus administration  of intravenous contrast.  CONTRAST:  80mL OMNIPAQUE IOHEXOL 300 MG/ML  SOLN  COMPARISON:  12/19/2011  FINDINGS: No pleural or pericardial effusion identified.  There is mild diffuse fatty infiltration of the liver. Prior cholecystitis me. No biliary dilatation. Normal appearance of the pancreas. The spleen is negative.  The adrenal glands are both normal. Mild right-sided perinephric fat stranding is identified striated nephrogram thick appearance of the inferior pole of the right kidney is noted which is suspicious for pyelonephritis. Partial nephrectomy from the upper pole of the left kidney noted. The urinary bladder appears normal. Previous hysterectomy.  Calcified atherosclerotic disease affects the abdominal aorta. No aneurysm. No upper abdominal adenopathy. There is no pelvic or inguinal adenopathy.  The stomach appears normal. The small bowel loops appear within normal limits. The appendix is visualized and appears normal. Normal appearance of the colon.  Review of the visualized osseous  structures is significant for scoliosis and mild multilevel degenerative disc disease.  IMPRESSION: 1. Suspect right-sided pyelonephritis.  2. Status post left partial nephrectomy.  3. Fatty infiltration of the liver.   Electronically Signed   By: Signa Kell M.D.   On: 09/10/2013 15:58      Assessment/Plan Active Problems:   UTI (lower urinary tract infection)/Pyelonephritis with SIRs -As discussed above, urine cultures obtained, continue empiric antibiotics with  Rocephin and follow  -WBC 26, systolic BP is in the 90s initially.   Diarrhea -C. difficile cultures given her recent antibiotic use -Hydrate, supportive care and follow   Leukocytosis -secondary to above-C. management as above follow and recheck will also check lactic acid level follow   Hyponatremia -Likely secondary to volume depletion, hydrate follow and recheck   Hypokalemia -Replace potassium, secondary to GI losses  AKI -Likely  secondary to vomit patient, hydrate follow and recheck     Code Status: Full code Family Communication: None at bedside  Disposition Plan: admit to tele  Time spent: >59mins  Kela Millin [facility] Pager [phone]  If 7PM-7AM, please contact night-coverage [email or web address] Password [email or web address] 09/10/2013, 6:10 PM

## 2013-09-10 NOTE — ED Notes (Signed)
IV team notified about IV start and blood work needed.

## 2013-09-10 NOTE — ED Provider Notes (Signed)
CSN: [id number]     Arrival date & time 09/10/13  1023 History   First MD Initiated Contact with Patient 09/10/13 1046     Chief Complaint  Patient presents with  . Diarrhea   (Consider location/radiation/quality/duration/timing/severity/associated sxs/prior Treatment) HPI  67 year old female with diarrhea. Onset about 3 days ago. Multiple episodes. Initially had nausea and vomiting. Vominting has resolved in past day, but diarrhea has continued. No BRBPR or melena. No sick contacts. No fevers or chills. No recent antibiotic usage, significant travel history or camping/outdoor activities.  She's tried taking Pepto-Bismol, aspirin and NSAIDs with minimal relief.    Past Medical History  Diagnosis Date  . Asthma   . Renal cell carcinoma 2012    left  . Arthritis    Past Surgical History  Procedure Laterality Date  . Kidney surgery  12/2010    [facility]; partial nephrectomy  . Hernia repair  07/2011    supraumbilical repair  . Bunionectomy  04/2011  . Tubal ligation    . Abdominal hysterectomy    . Menisectomy      left knee  . Cholecystectomy  11/13/2011    Procedure: LAPAROSCOPIC CHOLECYSTECTOMY WITH INTRAOPERATIVE CHOLANGIOGRAM;  Surgeon: Rulon Abide, DO;  Location: [facility];  Service: General;  Laterality: N/A;   No family history on file. History  Substance Use Topics  . Smoking status: Current Every Day Smoker -- 0.25 packs/day    Types: Cigarettes  . Smokeless tobacco: Never Used  . Alcohol Use: No     Comment: socially   OB History   Grav Para Term Preterm Abortions TAB SAB Ect Mult Living                 Review of Systems  All systems reviewed and negative, other than as noted in HPI.   Allergies  Azithromycin  Home Medications   Current Outpatient Rx  Name  Route  Sig  Dispense  Refill  . albuterol (PROVENTIL HFA;VENTOLIN HFA) 108 (90 BASE) MCG/ACT inhaler   Inhalation   Inhale 2 puffs into the lungs every 6 (six) hours as needed. Wheezing and  shortness of breath         . amoxicillin (AMOXIL) 500 MG capsule   Oral   Take 500 mg by mouth 3 (three) times daily.         Marland Kitchen aspirin EC 81 MG tablet   Oral   Take 81 mg by mouth daily.          . celecoxib (CELEBREX) 200 MG capsule   Oral   Take 1 capsule (200 mg total) by mouth daily.   30 capsule   0   . citalopram (CELEXA) 10 MG tablet   Oral   Take 1 tablet (10 mg total) by mouth daily.   30 tablet   0   . estrogens, conjugated, (PREMARIN) 1.25 MG tablet   Oral   Take 1.25 mg by mouth daily.         Marland Kitchen ezetimibe (ZETIA) 10 MG tablet   Oral   Take 1 tablet (10 mg total) by mouth daily.   30 tablet   0   . famotidine (PEPCID) 20 MG tablet   Oral   Take 1 tablet (20 mg total) by mouth 2 (two) times daily.   60 tablet   0   . fluticasone (VERAMYST) 27.5 MCG/SPRAY nasal spray   Nasal   Place 2 sprays into the nose daily.         Marland Kitchen  Fluticasone-Salmeterol (ADVAIR) 500-50 MCG/DOSE AEPB   Inhalation   Inhale 1 puff into the lungs every 12 (twelve) hours.   60 each   2   . hydrOXYzine (VISTARIL) 25 MG capsule   Oral   Take 1 capsule (25 mg total) by mouth 3 (three) times daily as needed for itching.   30 capsule   0   . levocetirizine (XYZAL) 5 MG tablet   Oral   Take 5 mg by mouth daily.          . methocarbamol (ROBAXIN) 500 MG tablet   Oral   Take 500 mg by mouth 2 (two) times daily as needed (for muscle spasms).          . montelukast (SINGULAIR) 10 MG tablet   Oral   Take 1 tablet (10 mg total) by mouth daily.   30 tablet   0   . simvastatin (ZOCOR) 40 MG tablet   Oral   Take 1 tablet (40 mg total) by mouth every evening.   30 tablet   0   . triamcinolone (NASACORT) 55 MCG/ACT nasal inhaler   Nasal   Place 2 sprays into the nose daily as needed (for allergies).           BP 90/59  Pulse 104  Temp(Src) 98.5 F (36.9 C) (Oral)  Resp 19  SpO2 99% Physical Exam  Nursing note and vitals reviewed. Constitutional: She  appears well-developed and well-nourished. No distress.  HENT:  Head: Normocephalic and atraumatic.  Eyes: Conjunctivae are normal. Right eye exhibits no discharge. Left eye exhibits no discharge.  Neck: Neck supple.  Cardiovascular: Normal rate, regular rhythm and normal heart sounds.  Exam reveals no gallop and no friction rub.   No murmur heard. Pulmonary/Chest: Effort normal and breath sounds normal. No respiratory distress.  Abdominal: Soft. She exhibits no distension. There is tenderness. There is no rebound.  Mild diffuse tenderness. No rebound or guarding.   Musculoskeletal: She exhibits no edema and no tenderness.  Neurological: She is alert.  Skin: Skin is warm and dry.  Psychiatric: She has a normal mood and affect. Her behavior is normal. Thought content normal.    ED Course  Procedures (including critical care time) Labs Review Labs Reviewed  COMPREHENSIVE METABOLIC PANEL - Abnormal; Notable for the following:    Sodium 128 (*)    Potassium 3.0 (*)    Chloride 89 (*)    Creatinine, Ser 1.24 (*)    Calcium 6.7 (*)    Albumin 2.4 (*)    AST 89 (*)    Alkaline Phosphatase 498 (*)    Total Bilirubin 1.4 (*)    GFR calc non Af Amer 45 (*)    GFR calc Af Amer 52 (*)    All other components within normal limits  CBC WITH DIFFERENTIAL - Abnormal; Notable for the following:    WBC 26.6 (*)    RBC 2.88 (*)    Hemoglobin 9.6 (*)    HCT 28.5 (*)    RDW 19.8 (*)    Neutrophils Relative % 88 (*)    Lymphocytes Relative 6 (*)    Neutro Abs 23.4 (*)    Monocytes Absolute 1.3 (*)    All other components within normal limits  LIPASE, BLOOD - Abnormal; Notable for the following:    Lipase 7 (*)    All other components within normal limits  CLOSTRIDIUM DIFFICILE BY PCR  URINALYSIS, ROUTINE W REFLEX MICROSCOPIC   Imaging Review Ct  Abdomen Pelvis W Contrast  09/10/2013   CLINICAL DATA:  Diarrhea.  History of renal cell carcinoma.  EXAM: CT ABDOMEN AND PELVIS WITH CONTRAST   TECHNIQUE: Multidetector CT imaging of the abdomen and pelvis was performed using the standard protocol following bolus administration of intravenous contrast.  CONTRAST:  80mL OMNIPAQUE IOHEXOL 300 MG/ML  SOLN  COMPARISON:  12/19/2011  FINDINGS: No pleural or pericardial effusion identified.  There is mild diffuse fatty infiltration of the liver. Prior cholecystitis me. No biliary dilatation. Normal appearance of the pancreas. The spleen is negative.  The adrenal glands are both normal. Mild right-sided perinephric fat stranding is identified striated nephrogram thick appearance of the inferior pole of the right kidney is noted which is suspicious for pyelonephritis. Partial nephrectomy from the upper pole of the left kidney noted. The urinary bladder appears normal. Previous hysterectomy.  Calcified atherosclerotic disease affects the abdominal aorta. No aneurysm. No upper abdominal adenopathy. There is no pelvic or inguinal adenopathy.  The stomach appears normal. The small bowel loops appear within normal limits. The appendix is visualized and appears normal. Normal appearance of the colon.  Review of the visualized osseous structures is significant for scoliosis and mild multilevel degenerative disc disease.  IMPRESSION: 1. Suspect right-sided pyelonephritis.  2. Status post left partial nephrectomy.  3. Fatty infiltration of the liver.   Electronically Signed   By: Signa Kell M.D.   On: 09/10/2013 15:58    EKG Interpretation   None       MDM   1. Pyelonephritis   2. Diarrhea   3. Dehydration   4. AKI (acute kidney injury)   5. Electrolyte abnormality     67 year old female with abdominal pain and diarrhea. Metabolic derrangement including electrolyte abnormalities and acute renal insufficiency likely secondary to ongoing diarrhea. Marked leukocytosis with >20% bands. Imaging of the abdomen and pelvis consistent with pyelonephritis. Unfortunately patient unable to provide a good urine  specimen herself because keeps being contaminated with stool. Will catheterize. Rocephin. IVF. Admission.     Raeford Razor, MD 09/16/13 [phone]

## 2013-09-11 ENCOUNTER — Inpatient Hospital Stay (HOSPITAL_COMMUNITY): Payer: Medicare Other

## 2013-09-11 DIAGNOSIS — N179 Acute kidney failure, unspecified: Secondary | ICD-10-CM | POA: Diagnosis present

## 2013-09-11 DIAGNOSIS — A419 Sepsis, unspecified organism: Secondary | ICD-10-CM | POA: Diagnosis present

## 2013-09-11 DIAGNOSIS — D638 Anemia in other chronic diseases classified elsewhere: Secondary | ICD-10-CM | POA: Diagnosis present

## 2013-09-11 DIAGNOSIS — E860 Dehydration: Secondary | ICD-10-CM

## 2013-09-11 DIAGNOSIS — D649 Anemia, unspecified: Secondary | ICD-10-CM | POA: Diagnosis present

## 2013-09-11 LAB — COMPREHENSIVE METABOLIC PANEL
ALT: 15 U/L (ref 0–35)
Albumin: 2 g/dL — ABNORMAL LOW (ref 3.5–5.2)
Alkaline Phosphatase: 445 U/L — ABNORMAL HIGH (ref 39–117)
BUN: 12 mg/dL (ref 6–23)
CO2: 20 mEq/L (ref 19–32)
Chloride: 90 mEq/L — ABNORMAL LOW (ref 96–112)
Creatinine, Ser: 1.37 mg/dL — ABNORMAL HIGH (ref 0.50–1.10)
GFR calc Af Amer: 46 mL/min — ABNORMAL LOW (ref >90)
Glucose, Bld: 84 mg/dL (ref 70–99)
Potassium: 5.6 mEq/L — ABNORMAL HIGH (ref 3.5–5.1)
Total Bilirubin: 1.1 mg/dL (ref 0.3–1.2)
Total Protein: 6.5 g/dL (ref 6.0–8.3)

## 2013-09-11 LAB — CBC
HCT: 25.3 % — ABNORMAL LOW (ref 36.0–46.0)
Hemoglobin: 8.4 g/dL — ABNORMAL LOW (ref 12.0–15.0)
MCV: 98.8 fL (ref 78.0–100.0)
Platelets: 267 10*3/uL (ref 150–400)
RDW: 19.7 % — ABNORMAL HIGH (ref 11.5–15.5)
WBC: 19.8 10*3/uL — ABNORMAL HIGH (ref 4.0–10.5)

## 2013-09-11 LAB — BASIC METABOLIC PANEL
BUN: 13 mg/dL (ref 6–23)
Calcium: 6.5 mg/dL — ABNORMAL LOW (ref 8.4–10.5)
Creatinine, Ser: 1.38 mg/dL — ABNORMAL HIGH (ref 0.50–1.10)
GFR calc Af Amer: 45 mL/min — ABNORMAL LOW (ref >90)
GFR calc non Af Amer: 39 mL/min — ABNORMAL LOW (ref >90)
Glucose, Bld: 80 mg/dL (ref 70–99)
Potassium: 5.8 mEq/L — ABNORMAL HIGH (ref 3.5–5.1)
Sodium: 124 mEq/L — ABNORMAL LOW (ref 135–145)

## 2013-09-11 LAB — CLOSTRIDIUM DIFFICILE BY PCR: Toxigenic C. Difficile by PCR: NEGATIVE

## 2013-09-11 MED ORDER — SODIUM CHLORIDE 0.9 % IV SOLN
1.0000 g | Freq: Once | INTRAVENOUS | Status: AC
  Administered 2013-09-11: 1 g via INTRAVENOUS
  Filled 2013-09-11: qty 10

## 2013-09-11 MED ORDER — SODIUM POLYSTYRENE SULFONATE 15 GM/60ML PO SUSP
30.0000 g | Freq: Once | ORAL | Status: AC
  Administered 2013-09-11: 15:00:00 30 g via ORAL
  Filled 2013-09-11: qty 120

## 2013-09-11 MED ORDER — SODIUM CHLORIDE 0.9 % IJ SOLN
10.0000 mL | INTRAMUSCULAR | Status: DC | PRN
  Administered 2013-09-12: 11:00:00 20 mL
  Administered 2013-09-12: 10 mL
  Administered 2013-09-13: 20 mL
  Administered 2013-09-14: 10 mL

## 2013-09-11 MED ORDER — CALCIUM CARBONATE-VITAMIN D 500-200 MG-UNIT PO TABS
2.0000 | ORAL_TABLET | Freq: Three times a day (TID) | ORAL | Status: DC
  Administered 2013-09-11 – 2013-09-14 (×9): 2 via ORAL
  Filled 2013-09-11 (×11): qty 2

## 2013-09-11 MED ORDER — CALCIUM CARBONATE-VITAMIN D 500-200 MG-UNIT PO TABS
2.0000 | ORAL_TABLET | Freq: Two times a day (BID) | ORAL | Status: DC
  Administered 2013-09-11: 2 via ORAL
  Filled 2013-09-11 (×2): qty 2

## 2013-09-11 NOTE — Progress Notes (Addendum)
[facility] PROGRESS NOTE  VAL FARNAM ZOX:[id number] DOB: [birth year] DOA: 09/10/2013 PCP: [name], MD  Brief narrative: 67 year old female with past medical history of renal cell carcinoma and partial left nephrectomy who presented to [facility] ED 09/10/2013 with complaints of abdominal pain and diarrhea for past few days prior to this admission. Abdoinal pain was mainly in the mid abdomen and then radiated to back especially on the right side. No reports of fever or chills at home. No significant nausea or vomiting.  In ED, BP was 90/59, HR 112 and T max 99.8 F. O2 saturation was 100% on room air. CBC revealed WBC count of 26.6, hemoglobin 9.6 and BMP showed sodium of 128, potassium of 3 and creatinine of 1.24. CT abdomen revealed acute right sided pyelonephritis and UA showed evidence of UTI.   Assessment/Plan:  Principal Problem:   Sepsis secondary to urinary tract infection and right sided pyelonephritis - pt met criteria of sepsis with admission vitals of SBP 90, HR 112, T 99.8 F, elevated WBC count at 26.6 and evidence of UTI on urinalysis as well as identified acute right side pyelonephritis on CT abdomen   - continue rocephin - follow up urine culture results - continue supportive care with IV fluids, analgesia and antiemetics Active Problems:   Leukocytosis - related to right side pyelonephritis - management as above   Diarrhea - possibly related to UTI/pyelonephritis - had 1 diarrhea this am - send stool for C.diff   Hyponatremia - secondary to dehydration - continue IV fluids   Hypokalemia and hyperkalemia - initially hypokalemic and repleted with potassium 40 meq x 4 doses and 60 meq additional dose - now hyperkalemic at 5.6 and 5.8 - give 1 dose of kayexalate 30 gm   Anemia of chronic disease - secondary to history of renal cell carcinoma - hemoglobin 9.6 and then 8.4 - no indications for transfusion at this time   Hypocalcemia - corrected calcium 8  (for albumin  2.4) - repleted with calcium gluconate and calcium supplementation TID   HYPERLIPIDEMIA - continue zetia and simvastatin   ASTHMA - stable, continue Singulair, Dulera and Flonase   Depression - continue Celexa 10 mg daily   Code Status: full code Family Communication: no family at the bedside Disposition Plan: home when stable  Manson Passey, MD  [facility] Pager [phone]  If 7PM-7AM, please contact night-coverage [email or web address] Password [email or web address] 09/11/2013, 7:02 AM   LOS: 1 day   Consultants:  None   Procedures:  None   Antibiotics:  Rocephin 09/10/2013 -->  HPI/Subjective: Still has pain especially on the right side. No overnight events.  Objective: Filed Vitals:   09/10/13 1817 09/10/13 2138 09/10/13 2147 09/11/13 0551  BP: 91/61 97/63  96/53  Pulse: 106 110  112  Temp: 98.5 F (36.9 C) 99.8 F (37.7 C)  99.8 F (37.7 C)  TempSrc: Oral Oral  Oral  Resp: 20 20  20   Height: 5\' 8"  (1.727 m)     Weight: 70.308 kg (155 lb)  69.174 kg (152 lb 8 oz) 69.083 kg (152 lb 4.8 oz)  SpO2: 100% 99%  100%    Intake/Output Summary (Last 24 hours) at 09/11/13 8295 Last data filed at 09/10/13 2300  Gross per 24 hour  Intake 476.67 ml  Output    100 ml  Net 376.67 ml    Exam:   General:  Pt is alert, ill, not in acute distress  Cardiovascular: Regular rate and rhythm, S1/S2 appreciated  Respiratory: Clear to auscultation bilaterally, no wheezing, no crackles, no rhonchi  Abdomen: Soft, non tender, non distended, CVAT present on right side; bowel sounds present, no guarding  Extremities: No edema, pulses DP and PT palpable bilaterally  Neuro: Grossly nonfocal  Data Reviewed: Basic Metabolic Panel:  Recent Labs Lab 09/10/13 1340 09/11/13 0510  NA 128* 123*  K 3.0* 5.6*  CL 89* 90*  CO2 24 20  GLUCOSE 90 84  BUN 9 12  CREATININE 1.24* 1.37*  CALCIUM 6.7* 6.2*   Liver Function Tests:  Recent Labs Lab 09/10/13 1340 09/11/13 0510  AST 89*  62*  ALT 18 15  ALKPHOS 498* 445*  BILITOT 1.4* 1.1  PROT 7.3 6.5  ALBUMIN 2.4* 2.0*    Recent Labs Lab 09/10/13 1340  LIPASE 7*   No results found for this basename: AMMONIA,  in the last 168 hours CBC:  Recent Labs Lab 09/10/13 1340 09/11/13 0510  WBC 26.6* 19.8*  NEUTROABS 23.4*  --   HGB 9.6* 8.4*  HCT 28.5* 25.3*  MCV 99.0 98.8  PLT 297 267   Cardiac Enzymes: No results found for this basename: CKTOTAL, CKMB, CKMBINDEX, TROPONINI,  in the last 168 hours BNP: No components found with this basename: POCBNP,  CBG: No results found for this basename: GLUCAP,  in the last 168 hours  No results found for this or any previous visit (from the past 240 hour(s)).   Studies: Ct Abdomen Pelvis W Contrast 09/10/2013     IMPRESSION: 1. Suspect right-sided pyelonephritis.  2. Status post left partial nephrectomy.  3. Fatty infiltration of the liver.     Scheduled Meds: . aspirin EC  81 mg Oral Daily  . cefTRIAXone  1 g Intravenous Q24H  . celecoxib  200 mg Oral Daily  . citalopram  10 mg Oral Daily  . estrogens (conjugated)  1.25 mg Oral Daily  . ezetimibe  10 mg Oral Daily  . famotidine  20 mg Oral BID  . fluticasone  2 spray Each Nare Daily  . mometasone-formoterol  2 puff Inhalation BID  . montelukast  10 mg Oral Daily  . simvastatin  40 mg Oral QPM   Continuous Infusions: . sodium chloride 100 mL/hr at 09/10/13 2109

## 2013-09-11 NOTE — Progress Notes (Signed)
CRITICAL VALUE ALERT  Critical value received:  Calcium 6.2  Date of notification:  09/11/13  Time of notification:  06:55  Critical value read back:yes  Nurse who received alert:  Lorretta Harp, RN  MD notified (1st page):  Dr. Elisabeth Pigeon notified via text page.  Time of first page:  08:00  MD notified (2nd page):  Time of second page:  Responding MD:    Time MD responded:

## 2013-09-11 NOTE — Progress Notes (Signed)
Peripherally Inserted Central Catheter/Midline Placement  The IV Nurse has discussed with the patient and/or persons authorized to consent for the patient, the purpose of this procedure and the potential benefits and risks involved with this procedure.  The benefits include less needle sticks, lab draws from the catheter and patient may be discharged home with the catheter.  Risks include, but not limited to, infection, bleeding, blood clot (thrombus formation), and puncture of an artery; nerve damage and irregular heat beat.  Alternatives to this procedure were also discussed.  PICC/Midline Placement Documentation        Lisabeth Devoid 09/11/2013, 2:41 PM

## 2013-09-12 LAB — COMPREHENSIVE METABOLIC PANEL
ALT: 13 U/L (ref 0–35)
AST: 44 U/L — ABNORMAL HIGH (ref 0–37)
Alkaline Phosphatase: 411 U/L — ABNORMAL HIGH (ref 39–117)
CO2: 20 mEq/L (ref 19–32)
Calcium: 6.5 mg/dL — ABNORMAL LOW (ref 8.4–10.5)
Chloride: 96 mEq/L (ref 96–112)
GFR calc Af Amer: 70 mL/min — ABNORMAL LOW (ref >90)
GFR calc non Af Amer: 60 mL/min — ABNORMAL LOW (ref >90)
Glucose, Bld: 106 mg/dL — ABNORMAL HIGH (ref 70–99)
Potassium: 4.2 mEq/L (ref 3.5–5.1)
Sodium: 129 mEq/L — ABNORMAL LOW (ref 135–145)
Total Bilirubin: 1 mg/dL (ref 0.3–1.2)

## 2013-09-12 LAB — CBC
MCH: 33.1 pg (ref 26.0–34.0)
MCHC: 33.3 g/dL (ref 30.0–36.0)
Platelets: 250 10*3/uL (ref 150–400)
RDW: 19.6 % — ABNORMAL HIGH (ref 11.5–15.5)

## 2013-09-12 LAB — PREPARE RBC (CROSSMATCH)

## 2013-09-12 MED ORDER — ENSURE COMPLETE PO LIQD
237.0000 mL | Freq: Two times a day (BID) | ORAL | Status: DC
  Administered 2013-09-12 – 2013-09-14 (×4): 237 mL via ORAL

## 2013-09-12 MED ORDER — LOPERAMIDE HCL 2 MG PO CAPS
2.0000 mg | ORAL_CAPSULE | ORAL | Status: DC | PRN
  Administered 2013-09-12 – 2013-09-13 (×4): 2 mg via ORAL
  Filled 2013-09-12 (×4): qty 1

## 2013-09-12 NOTE — Progress Notes (Signed)
INITIAL NUTRITION ASSESSMENT  DOCUMENTATION CODES Per approved criteria  -Not Applicable   INTERVENTION:  Encouraged intake of regular meals, snacks and supplements as needed Ensure Complete po BID, each supplement provides 350 kcal and 13 grams of protein. Discussed need to continue regular meals, snacks and supplements as needed after d/c.  NUTRITION DIAGNOSIS: Inadequate oral intake related to decreased appetite as evidenced by patient report.   Goal: Intake of meals and supplements to meet >/=90% of estimated needs.  Monitor:  Intake, labs, weight trend  Reason for Assessment: mst 67 y.o. female  Admitting Dx: Sepsis  ASSESSMENT: Patient with hx of renal cell carcinoma and partial left nephrectomy admitted with sepsis secondary to UTI.    Patient reports a poor appetite and has to force herself to eat especially in the am for the past 7-8 months.  Patient reports weight loss of 20 lbs during that time.  This is not substantiated by weight hx below.    Nutrition Focused Physical Exam:  Subcutaneous Fat:  Orbital Region: mild Upper Arm Region: mild Thoracic and Lumbar Region:  n/a  Muscle:  Temple Region: severe Clavicle Bone Region: mild Clavicle and Acromion Bone Region: mild Scapular Bone Region: wnl Dorsal Hand: wnl Patellar Region: wnl Anterior Thigh Region: wnl Posterior Calf Region: wnl  Edema: not noted   Height: Ht Readings from Last 1 Encounters:  09/10/13 5\' 8"  (1.727 m)    Weight: Wt Readings from Last 1 Encounters:  09/12/13 154 lb 14.4 oz (70.262 kg)    Ideal Body Weight: 140 lbs  % Ideal Body Weight: 110  Wt Readings from Last 10 Encounters:  09/12/13 154 lb 14.4 oz (70.262 kg)  08/07/13 152 lb (68.947 kg)  02/23/13 160 lb (72.576 kg)  02/18/13 130 lb (58.968 kg)  12/05/11 176 lb 3.2 oz (79.924 kg)  01/12/08 176 lb (79.833 kg)  11/17/07 170 lb 4 oz (77.225 kg)  08/21/07 170 lb (77.111 kg)  07/07/07 164 lb 9.6 oz (74.662 kg)   05/22/07 164 lb 9.6 oz (74.662 kg)    Usual Body Weight: 160 lbs  % Usual Body Weight: 96 lbs  BMI:  Body mass index is 23.56 kg/(m^2).  Estimated Nutritional Needs: Kcal: 1800-1900 Protein: 65-75 gm Fluid: 1.8L daily  Skin: dry  Diet Order: Cardiac  EDUCATION NEEDS: -Education needs addressed   Intake/Output Summary (Last 24 hours) at 09/12/13 1425 Last data filed at 09/12/13 1344  Gross per 24 hour  Intake 3072.5 ml  Output    100 ml  Net 2972.5 ml     Labs:   Recent Labs Lab 09/11/13 0510 09/11/13 0910 09/12/13 0430  NA 123* 124* 129*  K 5.6* 5.8* 4.2  CL 90* 92* 96  CO2 20 21 20   BUN 12 13 11   CREATININE 1.37* 1.38* 0.97  CALCIUM 6.2* 6.5* 6.5*  GLUCOSE 84 80 106*    CBG (last 3)  No results found for this basename: GLUCAP,  in the last 72 hours  Scheduled Meds: . aspirin EC  81 mg Oral Daily  . calcium-vitamin D  2 tablet Oral TID  . cefTRIAXone (ROCEPHIN)  IV  1 g Intravenous Q24H  . celecoxib  200 mg Oral Daily  . citalopram  10 mg Oral Daily  . estrogens (conjugated)  1.25 mg Oral Daily  . ezetimibe  10 mg Oral Daily  . famotidine  20 mg Oral BID  . fluticasone  2 spray Each Nare Daily  . mometasone-formoterol  2 puff Inhalation  BID  . montelukast  10 mg Oral Daily  . simvastatin  40 mg Oral QPM  . sodium chloride  3 mL Intravenous Q12H    Continuous Infusions: . sodium chloride 100 mL/hr at 09/12/13 0030    Past Medical History  Diagnosis Date  . Asthma   . Renal cell carcinoma 2012    left  . Arthritis     Past Surgical History  Procedure Laterality Date  . Kidney surgery  12/2010    [facility]; partial nephrectomy  . Hernia repair  07/2011    supraumbilical repair  . Bunionectomy  04/2011  . Tubal ligation    . Abdominal hysterectomy    . Menisectomy      left knee  . Cholecystectomy  11/13/2011    Procedure: LAPAROSCOPIC CHOLECYSTECTOMY WITH INTRAOPERATIVE CHOLANGIOGRAM;  Surgeon: Rulon Abide, DO;  Location: [facility];   Service: General;  Laterality: N/A;    Oran Rein, RD, LDN Clinical Inpatient Dietitian Pager:  [phone] Weekend and after hours pager:  [phone]

## 2013-09-12 NOTE — Progress Notes (Signed)
[facility] PROGRESS NOTE  ROCKIE VAWTER JXB:[id number] DOB: [birth year] DOA: 09/10/2013 PCP: [name], MD  Brief narrative: 67 year old female with past medical history of renal cell carcinoma and partial left nephrectomy who presented to [facility] ED 09/10/2013 with complaints of abdominal pain and diarrhea for past few days prior to this admission. Abdoinal pain was mainly in the mid abdomen and then radiated to back especially on the right side. No reports of fever or chills at home. No significant nausea or vomiting.  In ED, BP was 90/59, HR 112 and T max 99.8 F. O2 saturation was 100% on room air. CBC revealed WBC count of 26.6, hemoglobin 9.6 and BMP showed sodium of 128, potassium of 3 and creatinine of 1.24. CT abdomen revealed acute right sided pyelonephritis and UA showed evidence of UTI.   Assessment/Plan:   Principal Problem:  Sepsis secondary to urinary tract infection and right sided pyelonephritis  - pt met criteria of sepsis with admission vitals of SBP 90, HR 112, T 99.8 F, elevated WBC count at 26.6 and evidence of UTI on urinalysis as well as identified acute right side pyelonephritis on CT abdomen  - urine culture preliminary report shows more than 100 K E.Coli - continue rocephin IV daily - continue supportive care with IV fluids, analgesia and antiemetics  Active Problems:  Leukocytosis  - related to right side pyelonephritis  - management as above  Diarrhea  - possibly related to UTI/pyelonephritis  - still reports diarrhea - C.diff negative by PCR; may use imodium to control diarrhea  Hyponatremia  - secondary to dehydration  - continue IV fluids   Acute renal failure - secondary to UTI - creatinine is WNL today; resolved with IV fluids  Hypokalemia and hyperkalemia  - initially hypokalemic and repleted with potassium 40 meq x 4 doses and 60 meq additional dose  - on 09/11/2013 hyperkalemic at 5.6 and 5.8; given 1 dose of kayexalate 30 gm on 09/11/2013 -  potassium is WNL today Anemia of chronic disease  - secondary to history of renal cell carcinoma  - hemoglobin 9.6 --> 8.4 --> 7.8; will transfuse 1 unit PRBC today Hypocalcemia  - corrected calcium 8 (for albumin 2.4)  - repleted with calcium gluconate and calcium supplementation TID  HYPERLIPIDEMIA  - continue zetia and simvastatin  ASTHMA  - stable, continue Singulair, Dulera and Flonase  Depression  - continue Celexa 10 mg daily   Code Status: full code  Family Communication: no family at the bedside  Disposition Plan: home when stable   Manson Passey, MD  [facility]  Pager [phone]   Consultants:  None  Procedures:  None  Antibiotics:  Rocephin 09/10/2013 -->   If 7PM-7AM, please contact night-coverage [email or web address] Password [facility] 09/12/2013, 6:46 AM   LOS: 2 days    HPI/Subjective: Has abdominal pain. No acute overnight events.  Objective: Filed Vitals:   09/11/13 1317 09/11/13 2007 09/11/13 2125 09/12/13 0624  BP: 119/71  119/60 103/51  Pulse: 105  116 114  Temp: 98.4 F (36.9 C)  100.9 F (38.3 C) 100 F (37.8 C)  TempSrc: Oral  Oral Oral  Resp: 18  18 18   Height:      Weight:    70.262 kg (154 lb 14.4 oz)  SpO2: 100% 91% 95% 99%    Intake/Output Summary (Last 24 hours) at 09/12/13 0646 Last data filed at 09/11/13 1809  Gross per 24 hour  Intake   2805 ml  Output  475 ml  Net   2330 ml    Exam:   General:  Pt is alert, follows commands appropriately, not in acute distress  Cardiovascular: Regular rate and rhythm, S1/S2, no murmurs, no rubs, no gallops  Respiratory: Clear to auscultation bilaterally, no wheezing, no crackles, no rhonchi  Abdomen: Soft, non tender, non distended, bowel sounds present, no guarding  Extremities: No edema, pulses DP and PT palpable bilaterally  Neuro: Grossly nonfocal  Data Reviewed: Basic Metabolic Panel:  Recent Labs Lab 09/10/13 1340 09/11/13 0510 09/11/13 0910 09/12/13 0430  NA  128* 123* 124* 129*  K 3.0* 5.6* 5.8* 4.2  CL 89* 90* 92* 96  CO2 24 20 21 20   GLUCOSE 90 84 80 106*  BUN 9 12 13 11   CREATININE 1.24* 1.37* 1.38* 0.97  CALCIUM 6.7* 6.2* 6.5* 6.5*   Liver Function Tests:  Recent Labs Lab 09/10/13 1340 09/11/13 0510 09/12/13 0430  AST 89* 62* 44*  ALT 18 15 13   ALKPHOS 498* 445* 411*  BILITOT 1.4* 1.1 1.0  PROT 7.3 6.5 6.1  ALBUMIN 2.4* 2.0* 1.8*    Recent Labs Lab 09/10/13 1340  LIPASE 7*   No results found for this basename: AMMONIA,  in the last 168 hours CBC:  Recent Labs Lab 09/10/13 1340 09/11/13 0510 09/12/13 0430  WBC 26.6* 19.8* 16.1*  NEUTROABS 23.4*  --   --   HGB 9.6* 8.4* 7.8*  HCT 28.5* 25.3* 23.4*  MCV 99.0 98.8 99.2  PLT 297 267 250   Cardiac Enzymes: No results found for this basename: CKTOTAL, CKMB, CKMBINDEX, TROPONINI,  in the last 168 hours BNP: No components found with this basename: POCBNP,  CBG: No results found for this basename: GLUCAP,  in the last 168 hours  Recent Results (from the past 240 hour(s))  URINE CULTURE     Status: None   Collection Time    09/10/13  4:47 PM      Result Value Range Status   Specimen Description URINE, CATHETERIZED   Final   Special Requests NONE   Final   Culture  Setup Time     Final   Value: 09/11/2013 00:20     Performed at [facility] Count     Final   Value: >=100,000 COLONIES/ML     Performed at [facility]   Culture     Final   Value: ESCHERICHIA COLI     Performed at [facility]   Report Status PENDING   Incomplete  CLOSTRIDIUM DIFFICILE BY PCR     Status: None   Collection Time    09/11/13  3:32 AM      Result Value Range Status   C difficile by pcr NEGATIVE  NEGATIVE Final   Comment: Performed at [facility]     Studies: Ct Abdomen Pelvis W Contrast  09/10/2013   CLINICAL DATA:  Diarrhea.  History of renal cell carcinoma.  EXAM: CT ABDOMEN AND PELVIS WITH CONTRAST  TECHNIQUE: Multidetector CT  imaging of the abdomen and pelvis was performed using the standard protocol following bolus administration of intravenous contrast.  CONTRAST:  80mL OMNIPAQUE IOHEXOL 300 MG/ML  SOLN  COMPARISON:  12/19/2011  FINDINGS: No pleural or pericardial effusion identified.  There is mild diffuse fatty infiltration of the liver. Prior cholecystitis me. No biliary dilatation. Normal appearance of the pancreas. The spleen is negative.  The adrenal glands are both normal. Mild right-sided perinephric fat stranding is identified  striated nephrogram thick appearance of the inferior pole of the right kidney is noted which is suspicious for pyelonephritis. Partial nephrectomy from the upper pole of the left kidney noted. The urinary bladder appears normal. Previous hysterectomy.  Calcified atherosclerotic disease affects the abdominal aorta. No aneurysm. No upper abdominal adenopathy. There is no pelvic or inguinal adenopathy.  The stomach appears normal. The small bowel loops appear within normal limits. The appendix is visualized and appears normal. Normal appearance of the colon.  Review of the visualized osseous structures is significant for scoliosis and mild multilevel degenerative disc disease.  IMPRESSION: 1. Suspect right-sided pyelonephritis.  2. Status post left partial nephrectomy.  3. Fatty infiltration of the liver.   Electronically Signed   By: Signa Kell M.D.   On: 09/10/2013 15:58   Dg Chest Port 1 View  09/11/2013   CLINICAL DATA:  Check line placement  EXAM: PORTABLE CHEST - 1 VIEW  COMPARISON:  08/07/2013  FINDINGS: The PICC line is now noted on the right. Catheter tip is noted at the cavoatrial junction. The lungs are clear bilaterally. No acute bony abnormality is seen. The cardiac shadow is within normal limits.  IMPRESSION: PICC line in satisfactory position as described. No acute abnormality is noted.   Electronically Signed   By: Alcide Clever M.D.   On: 09/11/2013 15:06    Scheduled Meds: .  aspirin EC  81 mg Oral Daily  . calcium-vitamin D  2 tablet Oral TID  . cefTRIAXone (ROCEPHIN)  IV  1 g Intravenous Q24H  . celecoxib  200 mg Oral Daily  . citalopram  10 mg Oral Daily  . estrogens (conjugated)  1.25 mg Oral Daily  . ezetimibe  10 mg Oral Daily  . famotidine  20 mg Oral BID  . fluticasone  2 spray Each Nare Daily  . mometasone-formoterol  2 puff Inhalation BID  . montelukast  10 mg Oral Daily  . simvastatin  40 mg Oral QPM  . sodium chloride  3 mL Intravenous Q12H   Continuous Infusions: . sodium chloride 100 mL/hr at 09/12/13 0030

## 2013-09-13 LAB — URINE CULTURE: Colony Count: >=100000

## 2013-09-13 LAB — CBC
Hemoglobin: 8.7 g/dL — ABNORMAL LOW (ref 12.0–15.0)
MCH: 31.5 pg (ref 26.0–34.0)
MCV: 93.5 fL (ref 78.0–100.0)
Platelets: 260 10*3/uL (ref 150–400)
RBC: 2.76 MIL/uL — ABNORMAL LOW (ref 3.87–5.11)
RDW: 21.5 % — ABNORMAL HIGH (ref 11.5–15.5)
WBC: 16.7 10*3/uL — ABNORMAL HIGH (ref 4.0–10.5)

## 2013-09-13 LAB — TYPE AND SCREEN
ABO/RH(D): O POS
Unit division: 0

## 2013-09-13 LAB — BASIC METABOLIC PANEL
BUN: 5 mg/dL — ABNORMAL LOW (ref 6–23)
CO2: 20 mEq/L (ref 19–32)
Calcium: 6.4 mg/dL — CL (ref 8.4–10.5)
Chloride: 99 mEq/L (ref 96–112)
Creatinine, Ser: 0.63 mg/dL (ref 0.50–1.10)
GFR calc Af Amer: >90 mL/min (ref >90)
Glucose, Bld: 137 mg/dL — ABNORMAL HIGH (ref 70–99)
Sodium: 131 mEq/L — ABNORMAL LOW (ref 135–145)

## 2013-09-13 MED ORDER — DIPHENOXYLATE-ATROPINE 2.5-0.025 MG PO TABS
1.0000 | ORAL_TABLET | Freq: Four times a day (QID) | ORAL | Status: DC
  Administered 2013-09-13 – 2013-09-14 (×5): 1 via ORAL
  Filled 2013-09-13 (×5): qty 1

## 2013-09-13 MED ORDER — DIPHENOXYLATE-ATROPINE 2.5-0.025 MG/5ML PO LIQD
5.0000 mL | Freq: Four times a day (QID) | ORAL | Status: DC
Start: 2013-09-13 — End: 2013-09-13
  Filled 2013-09-13: qty 5

## 2013-09-13 NOTE — Progress Notes (Signed)
Patient ID: [name], female   DOB: 1946/02/19, 67 y.o.   MRN: [id number] [facility] PROGRESS NOTE  [name] WUJ:[id number] DOB: 24-Jan-1946 DOA: 09/10/2013 PCP: [name], MD  Brief narrative: 67 year old female with past medical history of renal cell carcinoma and partial left nephrectomy who presented to [facility] ED 09/10/2013 with complaints of abdominal pain and diarrhea for past few days prior to this admission. Abdoinal pain was mainly in the mid abdomen and then radiated to back especially on the right side. No reports of fever or chills at home. No significant nausea or vomiting.   In ED, BP was 90/59, HR 112 and T max 99.8 F. O2 saturation was 100% on room air. CBC revealed WBC count of 26.6, hemoglobin 9.6 and BMP showed sodium of 128, potassium of 3 and creatinine of 1.24. CT abdomen revealed acute right sided pyelonephritis and UA showed evidence of UTI.   Assessment/Plan:  Principal Problem:  Sepsis secondary to urinary tract infection and right sided pyelonephritis  - pt meets criteria of sepsis with admission vitals of SBP 90, HR 112, T 99.8 F, elevated WBC count at 26.6 and evidence of UTI on UA, acute right side pyelonephritis on CT abdomen  - urine culture preliminary report shows more than 100 K E.Coli  - continue rocephin IV daily  - continue supportive care with IV fluids, analgesia and antiemetics  Active Problems:  Leukocytosis  - related to right side pyelonephritis, WBC trending down  But pending this AM - management as above  Diarrhea  - possibly related to UTI/pyelonephritis  - will place order for stool O&P, stool culture and GI panel  - C.diff negative by PCR; may use imodium, lomotil as needed to control diarrhea  Hyponatremia  - secondary to dehydration, encouraged PO intake   - continue IV fluids  - sodium is trending towards the normal values but BMP this am pending  Acute renal failure  - secondary to UTI  - creatinine is WNL 11/02; resolved  with IV fluids  Hypokalemia and hyperkalemia  - initially hypokalemic and repleted with potassium 40 meq x 4 doses and 60 meq additional dose  - on 09/11/2013 hyperkalemic at 5.6 and 5.8; given 1 dose of kayexalate 30 gm on 09/11/2013  - potassium is WNL 11/02 Anemia of chronic disease  - secondary to history of renal cell carcinoma  - hemoglobin 9.6 --> 8.4 --> 7.8; status post transfusion one unit PRBC 11/02 - CBC pending this AM  Hypocalcemia  - corrected calcium 8 (for albumin 2.4)  - repleted with calcium gluconate and calcium supplementation TID  HYPERLIPIDEMIA  - continue zetia and simvastatin  ASTHMA  - stable, continue Singulair, Dulera and Flonase  Depression  - continue Celexa 10 mg daily  Transaminitis - possibly related to principal problem, sepsis from pyelo/UTI - AST trending down   Code Status: full code  Family Communication: no family at the bedside  Disposition Plan: remains inpatient   Manson Passey, MD  [facility]  Pager [phone]   Consultants:  None  Procedures:  None  Antibiotics:  Rocephin 09/10/2013 -->  If 7PM-7AM, please contact night-coverage  [email or web address]  Password [email or web address]  09/13/2013, 9:23 AM   LOS: 3 days   HPI/Subjective: Still has diarrhea but feels better overall.   Objective: Filed Vitals:   09/12/13 1620 09/12/13 1939 09/12/13 2135 09/13/13 0515  BP: 128/67  114/61 113/58  Pulse: 105  104 105  Temp: 99.9 F (37.7 C)  98.6 F (37 C) 98.9 F (37.2 C)  TempSrc: Oral  Oral Oral  Resp: 20  20 18   Height:      Weight:    73.3 kg (161 lb 9.6 oz)  SpO2: 100% 95% 99% 100%    Intake/Output Summary (Last 24 hours) at 09/13/13 0923 Last data filed at 09/13/13 0700  Gross per 24 hour  Intake 3082.75 ml  Output      0 ml  Net 3082.75 ml    Exam:   General:  Pt is alert, follows commands appropriately, not in acute distress  Cardiovascular: Regular rate and rhythm, S1/S2, no murmurs, no rubs, no gallops  Respiratory:  Clear to auscultation bilaterally, no wheezing, no crackles, no rhonchi  Abdomen: Soft, non tender, non distended, bowel sounds present, no guarding  Extremities: No edema, pulses DP and PT palpable bilaterally  Neuro: Grossly nonfocal  Data Reviewed: Basic Metabolic Panel:  Recent Labs Lab 09/10/13 1340 09/11/13 0510 09/11/13 0910 09/12/13 0430  NA 128* 123* 124* 129*  K 3.0* 5.6* 5.8* 4.2  CL 89* 90* 92* 96  CO2 24 20 21 20   GLUCOSE 90 84 80 106*  BUN 9 12 13 11   CREATININE 1.24* 1.37* 1.38* 0.97  CALCIUM 6.7* 6.2* 6.5* 6.5*   Liver Function Tests:  Recent Labs Lab 09/10/13 1340 09/11/13 0510 09/12/13 0430  AST 89* 62* 44*  ALT 18 15 13   ALKPHOS 498* 445* 411*  BILITOT 1.4* 1.1 1.0  PROT 7.3 6.5 6.1  ALBUMIN 2.4* 2.0* 1.8*    Recent Labs Lab 09/10/13 1340  LIPASE 7*   CBC:  Recent Labs Lab 09/10/13 1340 09/11/13 0510 09/12/13 0430  WBC 26.6* 19.8* 16.1*  NEUTROABS 23.4*  --   --   HGB 9.6* 8.4* 7.8*  HCT 28.5* 25.3* 23.4*  MCV 99.0 98.8 99.2  PLT 297 267 250    Recent Results (from the past 240 hour(s))  URINE CULTURE     Status: None   Collection Time    09/10/13  4:47 PM      Result Value Range Status   Specimen Description URINE, CATHETERIZED   Final   Special Requests NONE   Final   Culture  Setup Time     Final   Value: 09/11/2013 00:20     Performed at [facility] Count     Final   Value: >=100,000 COLONIES/ML     Performed at [facility]   Culture     Final   Value: ESCHERICHIA COLI     Performed at [facility]   Report Status 09/13/2013 FINAL   Final   Organism ID, Bacteria ESCHERICHIA COLI   Final  CLOSTRIDIUM DIFFICILE BY PCR     Status: None   Collection Time    09/11/13  3:32 AM      Result Value Range Status   C difficile by pcr NEGATIVE  NEGATIVE Final   Comment: Performed at [facility]     Studies: Dg Chest Port 1 View   09/11/2013   PICC line in satisfactory position  as described. No acute abnormality is noted.      Scheduled Meds: . aspirin EC  81 mg Oral Daily  . calcium-vitamin D  2 tablet Oral TID  . cefTRIAXone   IV  1 g Intravenous Q24H  . celecoxib  200 mg Oral Daily  . citalopram  10 mg Oral Daily  . diphenoxylate-atropine  5 mL  Oral QID  . estrogens (conjugated)  1.25 mg Oral Daily  . ezetimibe  10 mg Oral Daily  . famotidine  20 mg Oral BID  . fluticasone  2 spray Each Nare Daily  . mometasone-formoterol  2 puff Inhalation BID  . montelukast  10 mg Oral Daily  . simvastatin  40 mg Oral QPM   Continuous Infusions: . sodium chloride 100 mL/hr at 09/12/13 2314

## 2013-09-14 LAB — OVA AND PARASITE EXAMINATION: Ova and parasites: NONE SEEN

## 2013-09-14 LAB — FECAL LACTOFERRIN, QUANT
Fecal Lactoferrin: NEGATIVE
Fecal Lactoferrin: NEGATIVE

## 2013-09-14 LAB — COMPREHENSIVE METABOLIC PANEL
ALT: 15 U/L (ref 0–35)
AST: 46 U/L — ABNORMAL HIGH (ref 0–37)
Albumin: 1.6 g/dL — ABNORMAL LOW (ref 3.5–5.2)
Alkaline Phosphatase: 351 U/L — ABNORMAL HIGH (ref 39–117)
BUN: 4 mg/dL — ABNORMAL LOW (ref 6–23)
Calcium: 6.7 mg/dL — ABNORMAL LOW (ref 8.4–10.5)
Chloride: 103 mEq/L (ref 96–112)
Potassium: 4.8 mEq/L (ref 3.5–5.1)
Sodium: 134 mEq/L — ABNORMAL LOW (ref 135–145)
Total Protein: 5.8 g/dL — ABNORMAL LOW (ref 6.0–8.3)

## 2013-09-14 LAB — GI PATHOGEN PANEL BY PCR, STOOL
Campylobacter by PCR: NEGATIVE
Cryptosporidium by PCR: NEGATIVE
E coli (STEC): NEGATIVE
G lamblia by PCR: NEGATIVE
Norovirus GI/GII: NEGATIVE
Rotavirus A by PCR: NEGATIVE
Shigella by PCR: POSITIVE

## 2013-09-14 LAB — CBC
MCH: 31.5 pg (ref 26.0–34.0)
MCHC: 32.7 g/dL (ref 30.0–36.0)
Platelets: 279 10*3/uL (ref 150–400)
RDW: 21.4 % — ABNORMAL HIGH (ref 11.5–15.5)
WBC: 12.6 10*3/uL — ABNORMAL HIGH (ref 4.0–10.5)

## 2013-09-14 MED ORDER — ZOLPIDEM TARTRATE 5 MG PO TABS: 5.0000 mg | ORAL_TABLET | Freq: Every evening | ORAL | Status: DC | PRN

## 2013-09-14 MED ORDER — CALCIUM CARBONATE-VITAMIN D 500-200 MG-UNIT PO TABS: 2.0000 | ORAL_TABLET | Freq: Three times a day (TID) | ORAL | Status: DC

## 2013-09-14 MED ORDER — LEVOFLOXACIN 750 MG PO TABS: 750.0000 mg | ORAL_TABLET | Freq: Every day | ORAL | Status: DC

## 2013-09-14 MED ORDER — HYDROCODONE-ACETAMINOPHEN 5-325 MG PO TABS: 1.0000 | ORAL_TABLET | Freq: Four times a day (QID) | ORAL | Status: DC | PRN

## 2013-09-14 MED ORDER — DIPHENOXYLATE-ATROPINE 2.5-0.025 MG PO TABS: 1.0000 | ORAL_TABLET | Freq: Four times a day (QID) | ORAL | Status: DC | PRN

## 2013-09-14 NOTE — Discharge Summary (Signed)
Physician Discharge Summary  [name]:[id number] DOB: March 06, 1946 DOA: 09/10/2013  PCP: Emily Blade, MD  Admit date: 09/10/2013 Discharge date: 09/14/2013  Recommendations for Outpatient Follow-up:  1. You were hospitalized for acute pyelonephritis. You have received rocephin in hospital and on discharge you will continue Levaquin 750 mg daily for 9 more days to complete total of 14 days course of antibiotics. 2. Appointment was scheduled with Dr. Willey Livingston Thursday 09/16/2013 at 9:30 am and I have informed Dr. Diamantina Providence RN to check results of stool studies, ova and parasites 3. C.diff was negative on this admission 4. Patient has received 1 unit of PRBC during the hospital stay  5. Please stop aspirin for at least 1-2 weeks until hemoglobin improves; hemoglobin was 8.5 at the time of discharge 6. Check CBC Thursday 11/6/ at PCP office 7. We are supplementing calcium as it was low (corrected calcium = 8). Recheck calcium level 09/16/2013 at PCP office  Discharge Diagnoses:  Principal Problem:   Sepsis Active Problems:   UTI (lower urinary tract infection)   Pyelonephritis   Leukocytosis   Hyponatremia   Hypokalemia   Anemia of chronic disease   Hypocalcemia   Acute renal failure   HYPERLIPIDEMIA   ASTHMA    Discharge Condition: medically stable for discharge home today  Diet recommendation: as tolerated   History of present illness:  67 year old female with past medical history of renal cell carcinoma and partial left nephrectomy who presented to [facility] ED 09/10/2013 with complaints of abdominal pain and diarrhea for past few days prior to this admission. Abdoinal pain was mainly in the mid abdomen and then radiated to back especially on the right side. No reports of fever or chills at home. No significant nausea or vomiting.  In ED, BP was 90/59, HR 112 and T max 99.8 F. O2 saturation was 100% on room air. CBC revealed WBC count of 26.6, hemoglobin 9.6 and BMP showed sodium of  128, potassium of 3 and creatinine of 1.24. CT abdomen revealed acute right sided pyelonephritis and UA showed evidence of UTI.   Assessment/Plan:   Principal Problem:  Sepsis secondary to urinary tract infection and right sided pyelonephritis  - pt met criteria of sepsis with admission vitals of SBP 90, HR 112, T 99.8 F, elevated WBC count at 26.6 and evidence of UTI on UA, acute right side pyelonephritis on CT abdomen  - urine culture shows Escherichia coli. Patient received Rocephin IV daily for 5 days. Patient will continue Levaquin 750 mg daily for additional 9 days.  Active Problems:  Leukocytosis  - related to right side pyelonephritis, WBC trending down Diarrhea  - possibly related to UTI/pyelonephritis  - Stool cultures are pending. I spoke with patient's PCP to make sure that they followup in stool culture results. Appointment is scheduled for November 6 at 9:30 AM - C.diff negative by PCR; may use lomotil as needed to control diarrhea  Hyponatremia  - secondary to dehydration, encouraged PO intake  - Sodium improved with IV fluids. Sodium is 134 prior to discharge. Acute renal failure  - secondary to UTI  - creatinine is WNL Hypokalemia and hyperkalemia  - initially hypokalemic and repleted with potassium 40 meq x 4 doses and 60 meq additional dose  - on 09/11/2013 hyperkalemic at 5.6 and 5.8; given 1 dose of kayexalate 30 gm on 09/11/2013  - potassium is WNL prior to discharge  Anemia of chronic disease  - secondary to history of renal cell  carcinoma  - hemoglobin 9.6 --> 8.4 --> 7.8; status post transfusion one unit PRBC 11/02  - Posttransfusion hemoglobin stable at 8.5, 8.7 Hypocalcemia  - corrected calcium 8 (for albumin 2.4)  - repleted with calcium gluconate and patient will continue taking calcium supplementation TID on discharge HYPERLIPIDEMIA  - continue zetia and simvastatin  ASTHMA  - stable, continue Singulair, Dulera and Flonase  Depression  - continue  Celexa 10 mg daily  Transaminitis  - possibly related to principal problem, sepsis from pyelo/UTI  - AST trending down   Code Status: full code  Family Communication: no family at the bedside   Manson Passey, MD  [facility]  Pager [phone]   Consultants:  None  Procedures:  None  Antibiotics:  Rocephin 09/10/2013 --> 09/14/2013 and Levaquin on discharge     Signed:  Manson Passey, MD  [facility] 09/14/2013, 9:58 AM  Pager #: [phone]   Discharge Exam: Filed Vitals:   09/14/13 0814  BP:   Pulse: 102  Temp:   Resp: 18   Filed Vitals:   09/13/13 2135 09/14/13 0609 09/14/13 0814 09/14/13 0816  BP: 129/72 124/71    Pulse: 96 100 102   Temp: 98.2 F (36.8 C) 97.8 F (36.6 C)    TempSrc: Oral Oral    Resp: 18 18 18    Height:      Weight:  73.074 kg (161 lb 1.6 oz)    SpO2: 100% 100% 98% 98%    General: Pt is alert, follows commands appropriately, not in acute distress Cardiovascular: Regular rate and rhythm, S1/S2 +, no murmurs, no rubs, no gallops Respiratory: Clear to auscultation bilaterally, no wheezing, no crackles, no rhonchi Abdominal: Soft, non tender, non distended, bowel sounds +, no guarding Extremities: no edema, no cyanosis, pulses palpable bilaterally DP and PT Neuro: Grossly nonfocal  Discharge Instructions  Discharge Orders   Future Orders Complete By Expires   Call MD for:  difficulty breathing, headache or visual disturbances  As directed    Call MD for:  persistant dizziness or light-headedness  As directed    Call MD for:  persistant nausea and vomiting  As directed    Call MD for:  severe uncontrolled pain  As directed    Diet - low sodium heart healthy  As directed    Discharge instructions  As directed    Comments:     1. You were hospitalized for acute pyelonephritis. You have received rocephin in hospital and on discharge you will continue Levaquin 750 mg daily for 9 more days to complete total of 14 days course of  antibiotics. 2. Appointment was scheduled with Dr. Willey Livingston Thursday 09/16/2013 at 9:30 am and I have informed Dr. Diamantina Providence RN to check results of stool studies, ova and parasites 3. C.diff was negative on this admission 4. Patient has received 1 unit of PRBC during the hospital stay  5. Please stop aspirin for at least 1-2 weeks until hemoglobin improves; hemoglobin was 8.5 at the time of discharge 6. Check CBC Thursday 11/6/ at PCP office 7. We are supplementing calcium as it was low (corrected calcium = 8). Recheck calcium level 09/16/2013 at PCP office   Increase activity slowly  As directed        Medication List    STOP taking these medications       amoxicillin 500 MG capsule  Commonly known as:  AMOXIL     aspirin EC 81 MG tablet  TAKE these medications       albuterol 108 (90 BASE) MCG/ACT inhaler  Commonly known as:  PROVENTIL HFA;VENTOLIN HFA  Inhale 2 puffs into the lungs every 6 (six) hours as needed. Wheezing and shortness of breath     calcium-vitamin D 500-200 MG-UNIT per tablet  Commonly known as:  OSCAL WITH D  Take 2 tablets by mouth 3 (three) times daily.     celecoxib 200 MG capsule  Commonly known as:  CELEBREX  Take 1 capsule (200 mg total) by mouth daily.     citalopram 10 MG tablet  Commonly known as:  CELEXA  Take 1 tablet (10 mg total) by mouth daily.     diphenoxylate-atropine 2.5-0.025 MG per tablet  Commonly known as:  LOMOTIL  Take 1 tablet by mouth 4 (four) times daily as needed for diarrhea or loose stools.     estrogens (conjugated) 1.25 MG tablet  Commonly known as:  PREMARIN  Take 1.25 mg by mouth daily.     ezetimibe 10 MG tablet  Commonly known as:  ZETIA  Take 1 tablet (10 mg total) by mouth daily.     famotidine 20 MG tablet  Commonly known as:  PEPCID  Take 1 tablet (20 mg total) by mouth 2 (two) times daily.     fluticasone 27.5 MCG/SPRAY nasal spray  Commonly known as:  VERAMYST  Place 2 sprays into the nose daily.      Fluticasone-Salmeterol 500-50 MCG/DOSE Aepb  Commonly known as:  ADVAIR  Inhale 1 puff into the lungs every 12 (twelve) hours.     HYDROcodone-acetaminophen 5-325 MG per tablet  Commonly known as:  NORCO  Take 1 tablet by mouth every 6 (six) hours as needed for moderate pain.     hydrOXYzine 25 MG capsule  Commonly known as:  VISTARIL  Take 1 capsule (25 mg total) by mouth 3 (three) times daily as needed for itching.     levocetirizine 5 MG tablet  Commonly known as:  XYZAL  Take 5 mg by mouth daily.     levofloxacin 750 MG tablet  Commonly known as:  LEVAQUIN  Take 1 tablet (750 mg total) by mouth daily.     methocarbamol 500 MG tablet  Commonly known as:  ROBAXIN  Take 500 mg by mouth 2 (two) times daily as needed (for muscle spasms).     montelukast 10 MG tablet  Commonly known as:  SINGULAIR  Take 1 tablet (10 mg total) by mouth daily.     simvastatin 40 MG tablet  Commonly known as:  ZOCOR  Take 1 tablet (40 mg total) by mouth every evening.     triamcinolone 55 MCG/ACT nasal inhaler  Commonly known as:  NASACORT  Place 2 sprays into the nose daily as needed (for allergies).     zolpidem 5 MG tablet  Commonly known as:  AMBIEN  Take 1 tablet (5 mg total) by mouth at bedtime as needed for sleep.           Follow-up Information   Follow up with Emily Blade, MD On 09/16/2013. (at 9:30 am)    Specialty:  Internal Medicine   Contact information:   [facility] [address] [place] [address] [phone]        The results of significant diagnostics from this hospitalization (including imaging, microbiology, ancillary and laboratory) are listed below for reference.    Significant Diagnostic Studies: Ct Abdomen Pelvis W Contrast  09/10/2013  CLINICAL DATA:  Diarrhea.  History of renal cell carcinoma.  EXAM: CT ABDOMEN AND PELVIS WITH CONTRAST  TECHNIQUE: Multidetector CT imaging of the abdomen and pelvis was performed using  the standard protocol following bolus administration of intravenous contrast.  CONTRAST:  80mL OMNIPAQUE IOHEXOL 300 MG/ML  SOLN  COMPARISON:  12/19/2011  FINDINGS: No pleural or pericardial effusion identified.  There is mild diffuse fatty infiltration of the liver. Prior cholecystitis me. No biliary dilatation. Normal appearance of the pancreas. The spleen is negative.  The adrenal glands are both normal. Mild right-sided perinephric fat stranding is identified striated nephrogram thick appearance of the inferior pole of the right kidney is noted which is suspicious for pyelonephritis. Partial nephrectomy from the upper pole of the left kidney noted. The urinary bladder appears normal. Previous hysterectomy.  Calcified atherosclerotic disease affects the abdominal aorta. No aneurysm. No upper abdominal adenopathy. There is no pelvic or inguinal adenopathy.  The stomach appears normal. The small bowel loops appear within normal limits. The appendix is visualized and appears normal. Normal appearance of the colon.  Review of the visualized osseous structures is significant for scoliosis and mild multilevel degenerative disc disease.  IMPRESSION: 1. Suspect right-sided pyelonephritis.  2. Status post left partial nephrectomy.  3. Fatty infiltration of the liver.   Electronically Signed   By: Signa Kell M.D.   On: 09/10/2013 15:58   Dg Chest Port 1 View  09/11/2013   CLINICAL DATA:  Check line placement  EXAM: PORTABLE CHEST - 1 VIEW  COMPARISON:  08/07/2013  FINDINGS: The PICC line is now noted on the right. Catheter tip is noted at the cavoatrial junction. The lungs are clear bilaterally. No acute bony abnormality is seen. The cardiac shadow is within normal limits.  IMPRESSION: PICC line in satisfactory position as described. No acute abnormality is noted.   Electronically Signed   By: Alcide Clever M.D.   On: 09/11/2013 15:06    Microbiology: Recent Results (from the past 240 hour(s))  URINE CULTURE      Status: None   Collection Time    09/10/13  4:47 PM      Result Value Range Status   Specimen Description URINE, CATHETERIZED   Final   Special Requests NONE   Final   Culture  Setup Time     Final   Value: 09/11/2013 00:20     Performed at [facility] Count     Final   Value: >=100,000 COLONIES/ML     Performed at [facility]   Culture     Final   Value: ESCHERICHIA COLI     Performed at [facility]   Report Status 09/13/2013 FINAL   Final   Organism ID, Bacteria ESCHERICHIA COLI   Final  CLOSTRIDIUM DIFFICILE BY PCR     Status: None   Collection Time    09/11/13  3:32 AM      Result Value Range Status   C difficile by pcr NEGATIVE  NEGATIVE Final   Comment: Performed at [facility]     Labs: Basic Metabolic Panel:  Recent Labs Lab 09/11/13 0510 09/11/13 0910 09/12/13 0430 09/13/13 0917 09/14/13 0420  NA 123* 124* 129* 131* 134*  K 5.6* 5.8* 4.2 4.6 4.8  CL 90* 92* 96 99 103  CO2 20 21 20 20 21   GLUCOSE 84 80 106* 137* 92  BUN 12 13 11  5* 4*  CREATININE 1.37* 1.38* 0.97 0.63 0.56  CALCIUM 6.2* 6.5* 6.5* 6.4* 6.7*   Liver Function Tests:  Recent Labs Lab 09/10/13 1340 09/11/13 0510 09/12/13 0430 09/14/13 0420  AST 89* 62* 44* 46*  ALT 18 15 13 15   ALKPHOS 498* 445* 411* 351*  BILITOT 1.4* 1.1 1.0 0.8  PROT 7.3 6.5 6.1 5.8*  ALBUMIN 2.4* 2.0* 1.8* 1.6*    Recent Labs Lab 09/10/13 1340  LIPASE 7*   No results found for this basename: AMMONIA,  in the last 168 hours CBC:  Recent Labs Lab 09/10/13 1340 09/11/13 0510 09/12/13 0430 09/13/13 0917 09/14/13 0420  WBC 26.6* 19.8* 16.1* 16.7* 12.6*  NEUTROABS 23.4*  --   --   --   --   HGB 9.6* 8.4* 7.8* 8.7* 8.5*  HCT 28.5* 25.3* 23.4* 25.8* 26.0*  MCV 99.0 98.8 99.2 93.5 96.3  PLT 297 267 250 260 279   Cardiac Enzymes: No results found for this basename: CKTOTAL, CKMB, CKMBINDEX, TROPONINI,  in the last 168 hours BNP: BNP (last 3 results) No  results found for this basename: PROBNP,  in the last 8760 hours CBG: No results found for this basename: GLUCAP,  in the last 168 hours  Time coordinating discharge: Over 30 minutes

## 2013-09-14 NOTE — Progress Notes (Signed)
D/C instructions reviewed w/ pt and husband.  Both verbalized understanding, no further questions. IV RN in to d/c pt's PICC at 1450.  Pt laid flat x and dsg to rt upper arm is c/d/i w/ no drainage. Pt d/c in w/c by NT in stable condition. Pt in possession of d/c instructions, scripts, and all personal belongings. Pt d/c to husband's car.

## 2013-09-14 NOTE — Care Management Note (Signed)
    Page 1 of 1   09/14/2013     1:17:14 PM   CARE MANAGEMENT NOTE 09/14/2013  Patient:  [name], [name]   Account Number:  [id number]  Date Initiated:  09/11/2013  Documentation initiated by:  [facility]  Subjective/Objective Assessment:   67 year old female admitted with SIRS d/t pyelonephritis.     Action/Plan:   From home.   Anticipated DC Date:  09/14/2013   Anticipated DC Plan:  HOME/SELF CARE      DC Planning Services  CM consult      Choice offered to / List presented to:             Status of service:  Completed, signed off Medicare Important Message given?  NA - LOS <3 / Initial given by admissions (If response is "NO", the following Medicare IM given date fields will be blank) Date Medicare IM given:   Date Additional Medicare IM given:    Discharge Disposition:    Per UR Regulation:  Reviewed for med. necessity/level of care/duration of stay  If discussed at Long Length of Stay Meetings, dates discussed:    Comments:  09/14/13 Jermaine Tholl RN,BSN NCM [phone] D/C HOME NO NEEDS OR ORDERS.

## 2013-09-17 LAB — STOOL CULTURE

## 2013-09-22 ENCOUNTER — Encounter (HOSPITAL_COMMUNITY): Payer: Self-pay | Admitting: Emergency Medicine

## 2013-09-22 ENCOUNTER — Inpatient Hospital Stay (HOSPITAL_COMMUNITY): Payer: Medicare Other

## 2013-09-22 ENCOUNTER — Inpatient Hospital Stay (HOSPITAL_COMMUNITY)
Admission: EM | Admit: 2013-09-22 | Discharge: 2013-09-30 | DRG: 392 | Disposition: A | Discharge status: Home / Self Care | Payer: Medicare Other | Attending: Internal Medicine | Admitting: Internal Medicine

## 2013-09-22 DIAGNOSIS — E871 Hypo-osmolality and hyponatremia: Secondary | ICD-10-CM

## 2013-09-22 DIAGNOSIS — J45909 Unspecified asthma, uncomplicated: Secondary | ICD-10-CM | POA: Diagnosis present

## 2013-09-22 DIAGNOSIS — K7689 Other specified diseases of liver: Secondary | ICD-10-CM | POA: Diagnosis present

## 2013-09-22 DIAGNOSIS — E785 Hyperlipidemia, unspecified: Secondary | ICD-10-CM

## 2013-09-22 DIAGNOSIS — D72829 Elevated white blood cell count, unspecified: Secondary | ICD-10-CM

## 2013-09-22 DIAGNOSIS — N179 Acute kidney failure, unspecified: Secondary | ICD-10-CM

## 2013-09-22 DIAGNOSIS — B20 Human immunodeficiency virus [HIV] disease: Secondary | ICD-10-CM

## 2013-09-22 DIAGNOSIS — Z85528 Personal history of other malignant neoplasm of kidney: Secondary | ICD-10-CM

## 2013-09-22 DIAGNOSIS — K449 Diaphragmatic hernia without obstruction or gangrene: Secondary | ICD-10-CM | POA: Diagnosis present

## 2013-09-22 DIAGNOSIS — M129 Arthropathy, unspecified: Secondary | ICD-10-CM | POA: Diagnosis present

## 2013-09-22 DIAGNOSIS — D638 Anemia in other chronic diseases classified elsewhere: Secondary | ICD-10-CM

## 2013-09-22 DIAGNOSIS — Z789 Other specified health status: Secondary | ICD-10-CM

## 2013-09-22 DIAGNOSIS — E860 Dehydration: Secondary | ICD-10-CM

## 2013-09-22 DIAGNOSIS — R197 Diarrhea, unspecified: Secondary | ICD-10-CM

## 2013-09-22 DIAGNOSIS — N390 Urinary tract infection, site not specified: Secondary | ICD-10-CM

## 2013-09-22 DIAGNOSIS — Z905 Acquired absence of kidney: Secondary | ICD-10-CM

## 2013-09-22 DIAGNOSIS — K589 Irritable bowel syndrome without diarrhea: Principal | ICD-10-CM | POA: Diagnosis present

## 2013-09-22 DIAGNOSIS — Z9089 Acquired absence of other organs: Secondary | ICD-10-CM

## 2013-09-22 DIAGNOSIS — N12 Tubulo-interstitial nephritis, not specified as acute or chronic: Secondary | ICD-10-CM

## 2013-09-22 DIAGNOSIS — R1084 Generalized abdominal pain: Secondary | ICD-10-CM

## 2013-09-22 DIAGNOSIS — D126 Benign neoplasm of colon, unspecified: Secondary | ICD-10-CM | POA: Diagnosis present

## 2013-09-22 DIAGNOSIS — K259 Gastric ulcer, unspecified as acute or chronic, without hemorrhage or perforation: Secondary | ICD-10-CM | POA: Diagnosis present

## 2013-09-22 DIAGNOSIS — R634 Abnormal weight loss: Secondary | ICD-10-CM

## 2013-09-22 DIAGNOSIS — Z79899 Other long term (current) drug therapy: Secondary | ICD-10-CM

## 2013-09-22 DIAGNOSIS — F17200 Nicotine dependence, unspecified, uncomplicated: Secondary | ICD-10-CM | POA: Diagnosis present

## 2013-09-22 DIAGNOSIS — Z9119 Patient's noncompliance with other medical treatment and regimen: Secondary | ICD-10-CM

## 2013-09-22 DIAGNOSIS — T43205A Adverse effect of unspecified antidepressants, initial encounter: Secondary | ICD-10-CM | POA: Diagnosis present

## 2013-09-22 DIAGNOSIS — K648 Other hemorrhoids: Secondary | ICD-10-CM | POA: Diagnosis present

## 2013-09-22 DIAGNOSIS — K5730 Diverticulosis of large intestine without perforation or abscess without bleeding: Secondary | ICD-10-CM | POA: Diagnosis present

## 2013-09-22 DIAGNOSIS — Z91199 Patient's noncompliance with other medical treatment and regimen due to unspecified reason: Secondary | ICD-10-CM

## 2013-09-22 LAB — RAPID URINE DRUG SCREEN, HOSP PERFORMED
Amphetamines: NOT DETECTED
Barbiturates: NOT DETECTED
Benzodiazepines: NOT DETECTED
Cocaine: NOT DETECTED
Opiates: POSITIVE — AB
Tetrahydrocannabinol: NOT DETECTED

## 2013-09-22 LAB — CBC WITH DIFFERENTIAL/PLATELET
Basophils Absolute: 0.2 10*3/uL — ABNORMAL HIGH (ref 0.0–0.1)
Basophils Relative: 1 % (ref 0–1)
Eosinophils Relative: 2 % (ref 0–5)
HCT: 47.5 % — ABNORMAL HIGH (ref 36.0–46.0)
Hemoglobin: 14.8 g/dL (ref 12.0–15.0)
Lymphocytes Relative: 18 % (ref 12–46)
Lymphs Abs: 2.8 10*3/uL (ref 0.7–4.0)
MCH: 30.6 pg (ref 26.0–34.0)
MCV: 98.3 fL (ref 78.0–100.0)
Monocytes Relative: 5 % (ref 3–12)
Neutro Abs: 11.3 10*3/uL — ABNORMAL HIGH (ref 1.7–7.7)
RDW: 20.6 % — ABNORMAL HIGH (ref 11.5–15.5)
WBC: 15.4 10*3/uL — ABNORMAL HIGH (ref 4.0–10.5)

## 2013-09-22 LAB — URINALYSIS, ROUTINE W REFLEX MICROSCOPIC
Bilirubin Urine: NEGATIVE
Hgb urine dipstick: NEGATIVE
Ketones, ur: NEGATIVE mg/dL
Nitrite: NEGATIVE
Specific Gravity, Urine: 1.002 — ABNORMAL LOW (ref 1.005–1.030)
Urobilinogen, UA: 0.2 mg/dL (ref 0.0–1.0)

## 2013-09-22 LAB — COMPREHENSIVE METABOLIC PANEL
ALT: 28 U/L (ref 0–35)
AST: 99 U/L — ABNORMAL HIGH (ref 0–37)
Albumin: 2.7 g/dL — ABNORMAL LOW (ref 3.5–5.2)
Alkaline Phosphatase: 466 U/L — ABNORMAL HIGH (ref 39–117)
BUN: 5 mg/dL — ABNORMAL LOW (ref 6–23)
CO2: 23 mEq/L (ref 19–32)
Calcium: 9.2 mg/dL (ref 8.4–10.5)
Chloride: 98 mEq/L (ref 96–112)
GFR calc Af Amer: >90 mL/min (ref >90)
GFR calc non Af Amer: >90 mL/min (ref >90)
Glucose, Bld: 74 mg/dL (ref 70–99)
Potassium: 4.2 mEq/L (ref 3.5–5.1)
Sodium: 134 mEq/L — ABNORMAL LOW (ref 135–145)
Total Bilirubin: 0.7 mg/dL (ref 0.3–1.2)

## 2013-09-22 MED ORDER — ONDANSETRON HCL 4 MG/2ML IJ SOLN
4.0000 mg | Freq: Four times a day (QID) | INTRAMUSCULAR | Status: DC | PRN
  Administered 2013-09-24: 4 mg via INTRAVENOUS
  Filled 2013-09-22: qty 2

## 2013-09-22 MED ORDER — MORPHINE SULFATE 4 MG/ML IJ SOLN
4.0000 mg | Freq: Once | INTRAMUSCULAR | Status: AC
  Administered 2013-09-22: 4 mg via INTRAVENOUS
  Filled 2013-09-22: qty 1

## 2013-09-22 MED ORDER — LORATADINE 10 MG PO TABS
10.0000 mg | ORAL_TABLET | Freq: Every day | ORAL | Status: DC
  Administered 2013-09-22 – 2013-09-30 (×9): 10 mg via ORAL
  Filled 2013-09-22 (×9): qty 1

## 2013-09-22 MED ORDER — FLUTICASONE PROPIONATE 50 MCG/ACT NA SUSP
2.0000 | Freq: Every day | NASAL | Status: DC
  Administered 2013-09-24 – 2013-09-30 (×7): 2 via NASAL
  Filled 2013-09-22: qty 16

## 2013-09-22 MED ORDER — ENOXAPARIN SODIUM 40 MG/0.4ML ~~LOC~~ SOLN
40.0000 mg | SUBCUTANEOUS | Status: DC
  Administered 2013-09-22 – 2013-09-29 (×8): 40 mg via SUBCUTANEOUS
  Filled 2013-09-22 (×9): qty 0.4

## 2013-09-22 MED ORDER — HYDROCODONE-ACETAMINOPHEN 5-325 MG PO TABS
1.0000 | ORAL_TABLET | Freq: Once | ORAL | Status: AC
  Administered 2013-09-22: 1 via ORAL
  Filled 2013-09-22: qty 1

## 2013-09-22 MED ORDER — EZETIMIBE 10 MG PO TABS
10.0000 mg | ORAL_TABLET | Freq: Every day | ORAL | Status: DC
  Administered 2013-09-22 – 2013-09-30 (×9): 10 mg via ORAL
  Filled 2013-09-22 (×9): qty 1

## 2013-09-22 MED ORDER — MONTELUKAST SODIUM 10 MG PO TABS
10.0000 mg | ORAL_TABLET | Freq: Every day | ORAL | Status: DC
  Administered 2013-09-22 – 2013-09-30 (×9): 10 mg via ORAL
  Filled 2013-09-22 (×9): qty 1

## 2013-09-22 MED ORDER — HYDROCODONE-ACETAMINOPHEN 5-325 MG PO TABS
1.0000 | ORAL_TABLET | Freq: Four times a day (QID) | ORAL | Status: DC | PRN
  Administered 2013-09-23 – 2013-09-30 (×9): 1 via ORAL
  Filled 2013-09-22 (×10): qty 1

## 2013-09-22 MED ORDER — ACETAMINOPHEN 325 MG PO TABS
650.0000 mg | ORAL_TABLET | Freq: Four times a day (QID) | ORAL | Status: DC | PRN
  Administered 2013-09-24: 650 mg via ORAL
  Filled 2013-09-22: qty 2

## 2013-09-22 MED ORDER — ACETAMINOPHEN 650 MG RE SUPP: 650.0000 mg | Freq: Four times a day (QID) | RECTAL | Status: DC | PRN

## 2013-09-22 MED ORDER — CITALOPRAM HYDROBROMIDE 10 MG PO TABS
10.0000 mg | ORAL_TABLET | Freq: Every day | ORAL | Status: DC
  Administered 2013-09-22 – 2013-09-30 (×9): 10 mg via ORAL
  Filled 2013-09-22 (×9): qty 1

## 2013-09-22 MED ORDER — MOMETASONE FURO-FORMOTEROL FUM 200-5 MCG/ACT IN AERO
2.0000 | INHALATION_SPRAY | Freq: Two times a day (BID) | RESPIRATORY_TRACT | Status: DC
  Administered 2013-09-22 – 2013-09-30 (×16): 2 via RESPIRATORY_TRACT
  Filled 2013-09-22: qty 8.8

## 2013-09-22 MED ORDER — FLUTICASONE FUROATE 27.5 MCG/SPRAY NA SUSP: 2.0000 | Freq: Two times a day (BID) | NASAL | Status: DC

## 2013-09-22 MED ORDER — ZOLPIDEM TARTRATE 5 MG PO TABS
5.0000 mg | ORAL_TABLET | Freq: Every evening | ORAL | Status: DC | PRN
  Administered 2013-09-22 – 2013-09-29 (×8): 5 mg via ORAL
  Filled 2013-09-22 (×8): qty 1

## 2013-09-22 MED ORDER — LEVOCETIRIZINE DIHYDROCHLORIDE 5 MG PO TABS: 5.0000 mg | ORAL_TABLET | Freq: Every day | ORAL | Status: DC

## 2013-09-22 MED ORDER — FAMOTIDINE 20 MG PO TABS
20.0000 mg | ORAL_TABLET | Freq: Every day | ORAL | Status: DC
  Administered 2013-09-22 – 2013-09-26 (×5): 20 mg via ORAL
  Filled 2013-09-22 (×5): qty 1

## 2013-09-22 MED ORDER — SIMVASTATIN 40 MG PO TABS
40.0000 mg | ORAL_TABLET | Freq: Every evening | ORAL | Status: DC
  Administered 2013-09-22 – 2013-09-29 (×7): 40 mg via ORAL
  Filled 2013-09-22 (×9): qty 1

## 2013-09-22 MED ORDER — SODIUM CHLORIDE 0.9 % IV BOLUS (SEPSIS)
1000.0000 mL | Freq: Once | INTRAVENOUS | Status: AC
  Administered 2013-09-22: 1000 mL via INTRAVENOUS

## 2013-09-22 MED ORDER — LEVOFLOXACIN 750 MG PO TABS
750.0000 mg | ORAL_TABLET | Freq: Every day | ORAL | Status: DC
  Administered 2013-09-22 – 2013-09-24 (×3): 750 mg via ORAL
  Filled 2013-09-22 (×3): qty 1

## 2013-09-22 MED ORDER — ONDANSETRON HCL 4 MG PO TABS: 4.0000 mg | ORAL_TABLET | Freq: Four times a day (QID) | ORAL | Status: DC | PRN

## 2013-09-22 MED ORDER — SODIUM CHLORIDE 0.9 % IV SOLN
INTRAVENOUS | Status: DC
  Administered 2013-09-22: 20:00:00 via INTRAVENOUS
  Administered 2013-09-23: 100 mL via INTRAVENOUS
  Administered 2013-09-23 – 2013-09-24 (×3): via INTRAVENOUS

## 2013-09-22 MED ORDER — ONDANSETRON HCL 4 MG/2ML IJ SOLN
4.0000 mg | Freq: Once | INTRAMUSCULAR | Status: AC
  Administered 2013-09-22: 4 mg via INTRAVENOUS
  Filled 2013-09-22: qty 2

## 2013-09-22 NOTE — ED Notes (Signed)
Pt c/o diarrhea x2wks, pain to raw rectum area

## 2013-09-22 NOTE — ED Notes (Signed)
Unable to use urine sample patient gave because it had a lot of white particles in it.

## 2013-09-22 NOTE — Progress Notes (Signed)
[facility] History and Physical  [name]:[id number] DOB: [birth year] DOA: 09/22/2013   PCP: [name], MD   Chief Complaint: diarrhea  HPI:  67 year old female with a medical history of asthma, renal cell carcinoma, and diarrhea presents with 3-4 week history of diarrhea. The patient states that she has anywhere from 3-5 bowel movements daily. She has noted some blood when she wipes and feels that this may be related to rectal irritation from her loose stool. Otherwise she has not noted any hematochezia or melena. She has intermittent abdominal cramping but denies any overt abdominal pain. She denies any fevers, chills, chest pain, shortness of breath, nausea, vomiting, dysuria, hematuria. The patient was recently discharged from the hospital on 09/14/2013 after being treated for pyelonephritis. The patient was instructed to finish 9 additional days of levofloxacin. Unfortunately, the patient has been somewhat noncompliant with the levofloxacin. She states that she has not been taking it daily. She states that she has approximately 4 pills left. She had a GI pathogen panel on her last admission which was positive for Shigella. C. difficile PCR was negative. Urine culture showed Escherichia coli that was pansensitive. She complains of a 40 pound weight loss in the past 4 months. She denies any unusual rashes, visual disturbance, oral ulcerations, or arthritis. She denies any recent travels or eating raw or undercooked foods. She has not had any sick contacts.  An EGD, the patient was hemodynamically stable although she was tachycardic with a heart rate of 112. The patient was given a liter of normal saline. Sodium was 134. Hepatic enzymes revealed AST 99, ALT 28, phosphatase 466. Total bilirubin was 0.7. CBC showed WBC of 15.4. Assessment/Plan: Diarrhea/dehydration -Continue IV fluids -C. difficile PCR -Consider consulting GI  If C. difficile PCR is negative - Repeat GI pathogen  panel Transaminasemia -RUQ [place] -HIV -viral hepatitis serology -GGT -UDS Hyponatremia -mild -likely volume depletion -IV NS Pyelonephritis -continue levofloxacin -repeat UA neg for pyuria -abd exam--benign Asthma- -Stable without wheezing -contine home MDIs     Past Medical History  Diagnosis Date  . Asthma   . Renal cell carcinoma 2012    left  . Arthritis    Past Surgical History  Procedure Laterality Date  . Kidney surgery  12/2010    [facility]; partial nephrectomy  . Hernia repair  07/2011    supraumbilical repair  . Bunionectomy  04/2011  . Tubal ligation    . Abdominal hysterectomy    . Menisectomy      left knee  . Cholecystectomy  11/13/2011    Procedure: LAPAROSCOPIC CHOLECYSTECTOMY WITH INTRAOPERATIVE CHOLANGIOGRAM;  Surgeon: Rulon Abide, DO;  Location: [facility] ORS;  Service: General;  Laterality: N/A;   Social History:  reports that she has been smoking Cigarettes.  She has been smoking about 0.25 packs per day. She has never used smokeless tobacco. She reports that she does not drink alcohol or use illicit drugs.   History reviewed. No pertinent family history.   Allergies  Allergen Reactions  . Azithromycin Itching and Swelling    REACTION: unspecified      Prior to Admission medications   Medication Sig Start Date End Date Taking? Authorizing Provider  albuterol (PROVENTIL HFA;VENTOLIN HFA) 108 (90 BASE) MCG/ACT inhaler Inhale 2 puffs into the lungs every 6 (six) hours as needed. Wheezing and shortness of breath   Yes Historical Provider, MD  calcium-vitamin D (OSCAL WITH D) 500-200 MG-UNIT per tablet Take 1 tablet by mouth 2 (two) times daily.  Yes Historical Provider, MD  celecoxib (CELEBREX) 200 MG capsule Take 200 mg by mouth 2 (two) times daily.   Yes Historical Provider, MD  citalopram (CELEXA) 10 MG tablet Take 1 tablet (10 mg total) by mouth daily. 08/07/13  Yes Trevor Mace, PA-C  diphenoxylate-atropine (LOMOTIL) 2.5-0.025 MG per tablet  Take 1 tablet by mouth 4 (four) times daily as needed for diarrhea or loose stools. 09/14/13  Yes Alison Murray, MD  ezetimibe (ZETIA) 10 MG tablet Take 1 tablet (10 mg total) by mouth daily. 08/07/13  Yes Trevor Mace, PA-C  famotidine (PEPCID) 20 MG tablet Take 20 mg by mouth daily.   Yes Historical Provider, MD  fluticasone (VERAMYST) 27.5 MCG/SPRAY nasal spray Place 2 sprays into the nose 2 (two) times daily.    Yes Historical Provider, MD  Fluticasone-Salmeterol (ADVAIR) 500-50 MCG/DOSE AEPB Inhale 1 puff into the lungs every 12 (twelve) hours. 08/07/13  Yes Trevor Mace, PA-C  HYDROcodone-acetaminophen (NORCO) 5-325 MG per tablet Take 1 tablet by mouth every 6 (six) hours as needed for moderate pain. 09/14/13  Yes Alison Murray, MD  levocetirizine (XYZAL) 5 MG tablet Take 5 mg by mouth daily.    Yes Historical Provider, MD  levofloxacin (LEVAQUIN) 750 MG tablet Take 1 tablet (750 mg total) by mouth daily. 09/14/13  Yes Alison Murray, MD  methocarbamol (ROBAXIN) 500 MG tablet Take 500 mg by mouth 2 (two) times daily as needed (for muscle spasms).  02/18/13  Yes Roxy Horseman, PA-C  montelukast (SINGULAIR) 10 MG tablet Take 1 tablet (10 mg total) by mouth daily. 08/07/13  Yes Trevor Mace, PA-C  simvastatin (ZOCOR) 40 MG tablet Take 1 tablet (40 mg total) by mouth every evening. 08/07/13  Yes Trevor Mace, PA-C  triamcinolone (NASACORT) 55 MCG/ACT nasal inhaler Place 2 sprays into the nose 2 (two) times daily.    Yes Historical Provider, MD  zolpidem (AMBIEN) 5 MG tablet Take 1 tablet (5 mg total) by mouth at bedtime as needed for sleep. 09/14/13  Yes Alison Murray, MD    Review of Systems:  Constitutional:  No weight loss, night sweats, Head&Eyes: No headache.  No vision loss.  No eye pain or scotoma ENT:  No Difficulty swallowing,Tooth/dental problems,Sore throat,   Cardio-vascular:  No chest pain, Orthopnea, PND, swelling in lower extremities,  dizziness, palpitations  GI:  No   abdominal pain, nausea, vomiting,  loss of appetite, hematochezia, melena, heartburn, indigestion, Resp:  No shortness of breath with exertion or at rest. No cough. No coughing up of blood .No wheezing.No chest wall deformity  Skin:  no rash or lesions.  GU:  no dysuria, change in color of urine, no urgency or frequency. No flank pain.  Musculoskeletal:  No joint pain or swelling. No decreased range of motion. No back pain.  Psych:  No change in mood or affect. No depression or anxiety. Neurologic: No headache, no dysesthesia, no focal weakness, no vision loss. No syncope  Physical Exam: Filed Vitals:   09/22/13 1226  BP: 146/85  Pulse: 112  Temp: 98.3 F (36.8 C)  TempSrc: Oral  Resp: 16  SpO2: 97%   General:  A&O x 3, NAD, nontoxic, pleasant/cooperative Head/Eye: No conjunctival hemorrhage, no icterus, [place]/AT, No nystagmus ENT:  No icterus,  No thrush, good dentition, no pharyngeal exudate Neck:  No masses, no lymphadenpathy, no bruits CV:  RRR, no rub, no gallop, no S3 Lung:  CTAB, good air movement, no wheeze,  no rhonchi Abdomen: soft/NT, +BS, nondistended, no peritoneal signs Ext: No cyanosis, No rashes, No petechiae, No lymphangitis, No edema   Labs on Admission:  Basic Metabolic Panel:  Recent Labs Lab 09/22/13 1230  NA 134*  K 4.2  CL 98  CO2 23  GLUCOSE 74  BUN 5*  CREATININE 0.64  CALCIUM 9.2   Liver Function Tests:  Recent Labs Lab 09/22/13 1230  AST 99*  ALT 28  ALKPHOS 466*  BILITOT 0.7  PROT 8.2  ALBUMIN 2.7*   No results found for this basename: LIPASE, AMYLASE,  in the last 168 hours No results found for this basename: AMMONIA,  in the last 168 hours CBC:  Recent Labs Lab 09/22/13 1230  WBC 15.4*  NEUTROABS PENDING  HGB 14.8  HCT 47.5*  MCV 98.3  PLT 435*   Cardiac Enzymes: No results found for this basename: CKTOTAL, CKMB, CKMBINDEX, TROPONINI,  in the last 168 hours BNP: No components found with this basename: POCBNP,   CBG: No results found for this basename: GLUCAP,  in the last 168 hours  Radiological Exams on Admission: No results found.      Time spent:60 minutes Code Status:   FULL Family Communication:   No Family at bedside   Glendi Mohiuddin, DO  [facility] Pager [phone]  If 7PM-7AM, please contact night-coverage [email or web address] Password [facility] 09/22/2013, 4:43 PM

## 2013-09-22 NOTE — ED Notes (Signed)
I attend the blood draw and was unable to get ant blood

## 2013-09-22 NOTE — ED Provider Notes (Addendum)
CSN: [id number]     Arrival date & time 09/22/13  1218 History   First MD Initiated Contact with Patient 09/22/13 1244     Chief Complaint  Patient presents with  . Diarrhea   (Consider location/radiation/quality/duration/timing/severity/associated sxs/prior Treatment) HPI .Marland Kitchen... liquid brown diarrhea for 3 weeks not improving. No pus or blood in stool. No fever or chills. Patient was admitted to the hospital for 4 days for same recently with no firm diagnosis. She has lost 30 pounds in the past 3 months. Eating and drinking makes symptoms worse.   Remote history of renal cell carcinoma. Severity is moderate to severe.   Past Medical History  Diagnosis Date  . Asthma   . Renal cell carcinoma 2012    left  . Arthritis    Past Surgical History  Procedure Laterality Date  . Kidney surgery  12/2010    [facility]; partial nephrectomy  . Hernia repair  07/2011    supraumbilical repair  . Bunionectomy  04/2011  . Tubal ligation    . Abdominal hysterectomy    . Menisectomy      left knee  . Cholecystectomy  11/13/2011    Procedure: LAPAROSCOPIC CHOLECYSTECTOMY WITH INTRAOPERATIVE CHOLANGIOGRAM;  Surgeon: Rulon Abide, DO;  Location: [facility];  Service: General;  Laterality: N/A;   History reviewed. No pertinent family history. History  Substance Use Topics  . Smoking status: Current Every Day Smoker -- 0.25 packs/day    Types: Cigarettes  . Smokeless tobacco: Never Used  . Alcohol Use: No     Comment: socially   OB History   Grav Para Term Preterm Abortions TAB SAB Ect Mult Living                 Review of Systems  All other systems reviewed and are negative.    Allergies  Azithromycin  Home Medications   Current Outpatient Rx  Name  Route  Sig  Dispense  Refill  . albuterol (PROVENTIL HFA;VENTOLIN HFA) 108 (90 BASE) MCG/ACT inhaler   Inhalation   Inhale 2 puffs into the lungs every 6 (six) hours as needed. Wheezing and shortness of breath         . calcium-vitamin D  (OSCAL WITH D) 500-200 MG-UNIT per tablet   Oral   Take 1 tablet by mouth 2 (two) times daily.         . celecoxib (CELEBREX) 200 MG capsule   Oral   Take 200 mg by mouth 2 (two) times daily.         . citalopram (CELEXA) 10 MG tablet   Oral   Take 1 tablet (10 mg total) by mouth daily.   30 tablet   0   . diphenoxylate-atropine (LOMOTIL) 2.5-0.025 MG per tablet   Oral   Take 1 tablet by mouth 4 (four) times daily as needed for diarrhea or loose stools.   30 tablet   0   . ezetimibe (ZETIA) 10 MG tablet   Oral   Take 1 tablet (10 mg total) by mouth daily.   30 tablet   0   . famotidine (PEPCID) 20 MG tablet   Oral   Take 20 mg by mouth daily.         . fluticasone (VERAMYST) 27.5 MCG/SPRAY nasal spray   Nasal   Place 2 sprays into the nose 2 (two) times daily.          . Fluticasone-Salmeterol (ADVAIR) 500-50 MCG/DOSE AEPB   Inhalation  Inhale 1 puff into the lungs every 12 (twelve) hours.   60 each   2   . HYDROcodone-acetaminophen (NORCO) 5-325 MG per tablet   Oral   Take 1 tablet by mouth every 6 (six) hours as needed for moderate pain.   60 tablet   0   . levocetirizine (XYZAL) 5 MG tablet   Oral   Take 5 mg by mouth daily.          Marland Kitchen levofloxacin (LEVAQUIN) 750 MG tablet   Oral   Take 1 tablet (750 mg total) by mouth daily.   9 tablet   0   . methocarbamol (ROBAXIN) 500 MG tablet   Oral   Take 500 mg by mouth 2 (two) times daily as needed (for muscle spasms).          . montelukast (SINGULAIR) 10 MG tablet   Oral   Take 1 tablet (10 mg total) by mouth daily.   30 tablet   0   . simvastatin (ZOCOR) 40 MG tablet   Oral   Take 1 tablet (40 mg total) by mouth every evening.   30 tablet   0   . triamcinolone (NASACORT) 55 MCG/ACT nasal inhaler   Nasal   Place 2 sprays into the nose 2 (two) times daily.          Marland Kitchen zolpidem (AMBIEN) 5 MG tablet   Oral   Take 1 tablet (5 mg total) by mouth at bedtime as needed for sleep.   30  tablet   0    BP 146/85  Pulse 112  Temp(Src) 98.3 F (36.8 C) (Oral)  Resp 16  SpO2 97% Physical Exam  Nursing note and vitals reviewed. Constitutional: She is oriented to person, place, and time. She appears well-developed and well-nourished.  HENT:  Head: Normocephalic and atraumatic.  Eyes: Conjunctivae and EOM are normal. Pupils are equal, round, and reactive to light.  Neck: Normal range of motion. Neck supple.  Cardiovascular: Normal rate, regular rhythm and normal heart sounds.   Pulmonary/Chest: Effort normal and breath sounds normal.  Abdominal: Soft. Bowel sounds are normal.  Musculoskeletal: Normal range of motion.  Neurological: She is alert and oriented to person, place, and time.  Skin: Skin is warm and dry.  Psychiatric: She has a normal mood and affect.    ED Course  Procedures (including critical care time) Labs Review Labs Reviewed  URINALYSIS, ROUTINE W REFLEX MICROSCOPIC - Abnormal; Notable for the following:    Specific Gravity, Urine 1.002 (*)    All other components within normal limits  CLOSTRIDIUM DIFFICILE BY PCR  CBC WITH DIFFERENTIAL  COMPREHENSIVE METABOLIC PANEL   Imaging Review No results found.  EKG Interpretation   None       MDM  No diagnosis found. Discussed with Dr Tat.  He will evaluate patient.    Donnetta Hutching, MD 09/22/13 1610  Donnetta Hutching, MD 09/24/13 [phone]

## 2013-09-22 NOTE — ED Notes (Signed)
I have just phoned report to [place], RN on [address].  Will transport shortly.

## 2013-09-23 DIAGNOSIS — D638 Anemia in other chronic diseases classified elsewhere: Secondary | ICD-10-CM

## 2013-09-23 DIAGNOSIS — C649 Malignant neoplasm of unspecified kidney, except renal pelvis: Secondary | ICD-10-CM

## 2013-09-23 DIAGNOSIS — B20 Human immunodeficiency virus [HIV] disease: Secondary | ICD-10-CM

## 2013-09-23 DIAGNOSIS — N12 Tubulo-interstitial nephritis, not specified as acute or chronic: Secondary | ICD-10-CM

## 2013-09-23 DIAGNOSIS — R799 Abnormal finding of blood chemistry, unspecified: Secondary | ICD-10-CM

## 2013-09-23 LAB — GI PATHOGEN PANEL BY PCR, STOOL
C difficile toxin A/B: NEGATIVE
Cryptosporidium by PCR: NEGATIVE
E coli (STEC): NEGATIVE
G lamblia by PCR: NEGATIVE
Norovirus GI/GII: NEGATIVE
Rotavirus A by PCR: NEGATIVE
Salmonella by PCR: NEGATIVE
Shigella by PCR: NEGATIVE

## 2013-09-23 LAB — CBC
HCT: 25.9 % — ABNORMAL LOW (ref 36.0–46.0)
Hemoglobin: 8.6 g/dL — ABNORMAL LOW (ref 12.0–15.0)
MCHC: 33.2 g/dL (ref 30.0–36.0)
MCV: 98.1 fL (ref 78.0–100.0)
RBC: 2.64 MIL/uL — ABNORMAL LOW (ref 3.87–5.11)
RDW: 20.6 % — ABNORMAL HIGH (ref 11.5–15.5)
WBC: 13.5 10*3/uL — ABNORMAL HIGH (ref 4.0–10.5)

## 2013-09-23 LAB — BASIC METABOLIC PANEL
CO2: 23 mEq/L (ref 19–32)
Chloride: 106 mEq/L (ref 96–112)
Creatinine, Ser: 0.63 mg/dL (ref 0.50–1.10)
GFR calc non Af Amer: >90 mL/min (ref >90)
Potassium: 4.5 mEq/L (ref 3.5–5.1)

## 2013-09-23 LAB — DIFFERENTIAL
Basophils Absolute: 0 10*3/uL (ref 0.0–0.1)
Eosinophils Relative: 7 % — ABNORMAL HIGH (ref 0–5)
Lymphocytes Relative: 21 % (ref 12–46)
Lymphs Abs: 2.8 10*3/uL (ref 0.7–4.0)
Monocytes Relative: 8 % (ref 3–12)
Neutro Abs: 8.7 10*3/uL — ABNORMAL HIGH (ref 1.7–7.7)

## 2013-09-23 LAB — CLOSTRIDIUM DIFFICILE BY PCR: Toxigenic C. Difficile by PCR: NEGATIVE

## 2013-09-23 MED ORDER — ENSURE PUDDING PO PUDG
1.0000 | ORAL | Status: DC
  Administered 2013-09-23 – 2013-09-29 (×5): 1 via ORAL
  Filled 2013-09-23 (×8): qty 1

## 2013-09-23 MED ORDER — ALPRAZOLAM 1 MG PO TABS
1.0000 mg | ORAL_TABLET | Freq: Three times a day (TID) | ORAL | Status: DC | PRN
  Administered 2013-09-23 – 2013-09-29 (×10): 1 mg via ORAL
  Filled 2013-09-23 (×10): qty 1

## 2013-09-23 MED ORDER — ENSURE COMPLETE PO LIQD
237.0000 mL | ORAL | Status: DC
  Administered 2013-09-24 – 2013-09-29 (×5): 237 mL via ORAL

## 2013-09-23 NOTE — Progress Notes (Signed)
[facility] PROGRESS NOTE  [name] ZOX:[id number] DOB: 1946/10/24 DOA: 09/22/2013 PCP: [name], MD  Assessment/Plan: Diarrhea/dehydration  -Continue IV fluids  -C. difficile PCR neg -Repeat GI pathogen panel pending -Suspect diarrhea related to HIV (see below) Transaminasemia  -RUQ [place] with no biliary obstruction -HIV pos -viral hepatitis serology neg -GGT  -UDS unremarkable -Fatty liver on imaging Hyponatremia  -mild  -likely volume depletion  -IV NS  Pyelonephritis  -continue levofloxacin  -repeat UA neg for pyuria  -abd exam--benign  Asthma-  -Stable without wheezing  -contine home MDIs HIV - Will check genotype, viral load, cd4/8 - Consider ID consult  Code Status: Full Family Communication: Pt in room (indicate person spoken with, relationship, and if by phone, the number) Disposition Plan: Pending  Procedures:  Abd [place] 09/22/13  Antibiotics:  Levaquin 09/22/13  HPI/Subjective: No acute events noted overnight  Objective: Filed Vitals:   09/22/13 1655 09/22/13 2112 09/23/13 0503 09/23/13 0854  BP: 145/72 105/60 108/72   Pulse: 87 98 88   Temp: 98.7 F (37.1 C) 98.3 F (36.8 C) 98.4 F (36.9 C)   TempSrc: Oral Oral Oral   Resp: 16 18 20    SpO2: 97% 97% 97% 99%    Intake/Output Summary (Last 24 hours) at 09/23/13 1035 Last data filed at 09/23/13 0900  Gross per 24 hour  Intake 1521.67 ml  Output      2 ml  Net 1519.67 ml   There were no vitals filed for this visit.  Exam:   General:  Awake, in nad  Cardiovascular: regular, s1, s2  Respiratory: normal resp effort, no wheezing  Abdomen: soft, pos bs   Musculoskeletal: perfused, no clubbing   Data Reviewed: Basic Metabolic Panel:  Recent Labs Lab 09/22/13 1230 09/23/13 0534  NA 134* 137  K 4.2 4.5  CL 98 106  CO2 23 23  GLUCOSE 74 88  BUN 5* 4*  CREATININE 0.64 0.63  CALCIUM 9.2 8.2*   Liver Function Tests:  Recent Labs Lab 09/22/13 1230  AST 99*  ALT  28  ALKPHOS 466*  BILITOT 0.7  PROT 8.2  ALBUMIN 2.7*   No results found for this basename: LIPASE, AMYLASE,  in the last 168 hours No results found for this basename: AMMONIA,  in the last 168 hours CBC:  Recent Labs Lab 09/22/13 1230 09/23/13 0534  WBC 15.4* 13.5*  NEUTROABS 11.3*  --   HGB 14.8 8.6*  HCT 47.5* 25.9*  MCV 98.3 98.1  PLT 435* 642*   Cardiac Enzymes: No results found for this basename: CKTOTAL, CKMB, CKMBINDEX, TROPONINI,  in the last 168 hours BNP (last 3 results) No results found for this basename: PROBNP,  in the last 8760 hours CBG: No results found for this basename: GLUCAP,  in the last 168 hours  Recent Results (from the past 240 hour(s))  STOOL CULTURE     Status: None   Collection Time    09/13/13  3:27 PM      Result Value Range Status   Specimen Description STOOL   Final   Special Requests NONE   Final   Culture     Final   Value: NO SALMONELLA, SHIGELLA, CAMPYLOBACTER, YERSINIA, OR E.COLI 0157:H7 ISOLATED     Note: REDUCED NORMAL FLORA PRESENT     Performed at [facility]   Report Status 09/17/2013 FINAL   Final  OVA AND PARASITE EXAMINATION     Status: None   Collection Time  09/13/13  3:27 PM      Result Value Range Status   Specimen Description STOOL   Final   Special Requests NONE   Final   Ova and parasites     Final   Value: NO OVA OR PARASITES SEEN     Performed at [facility]   Report Status 09/14/2013 FINAL   Final     Studies: US Abdomen Limited Ruq  October 10, 2013   CLINICAL DATA:  Abnormal liver function tests and history of cholecystectomy.  EXAM: US ABDOMEN LIMITED - RIGHT UPPER QUADRANT  COMPARISON:  CT of the abdomen on 09/10/2013  FINDINGS: Gallbladder  Surgically absent. No abnormalities are identified in the gallbladder fossa.  Common bile duct  Diameter: Normal caliber of 6 mm.  Liver:  The liver is enlarged and diffusely heterogeneous and echogenic in appearance by ultrasound, consistent with  steatosis. No masses are identified by ultrasound.  No ascites is identified in the right upper quadrant.  IMPRESSION: Hepatomegaly and hepatic steatosis. No evidence of biliary obstruction.   Electronically Signed   By: Irish Lack M.D.   On: 2013/10/10 19:51    Scheduled Meds: . citalopram  10 mg Oral Daily  . enoxaparin (LOVENOX) injection  40 mg Subcutaneous Q24H  . ezetimibe  10 mg Oral Daily  . famotidine  20 mg Oral Daily  . fluticasone  2 spray Each Nare Daily  . levofloxacin  750 mg Oral Daily  . loratadine  10 mg Oral Daily  . mometasone-formoterol  2 puff Inhalation BID  . montelukast  10 mg Oral Daily  . simvastatin  40 mg Oral QPM   Continuous Infusions: . sodium chloride 100 mL/hr at 09/23/13 0353    Active Problems:   Diarrhea   Dehydration  Time spent:  Britany Callicott K  [facility] Pager [phone]. If 7PM-7AM, please contact night-coverage at [email or web address], password [facility] 09/23/2013, 10:35 AM  LOS: 1 day

## 2013-09-23 NOTE — Progress Notes (Signed)
Chaplain responded to pt request / care team referral.  Provided spiritual support with pt at bedside.  Pt wishes to speak with MD regarding questions about illness.  Requested chaplain follow up at later time.   Will continue to follow for support.

## 2013-09-23 NOTE — Progress Notes (Signed)
INITIAL NUTRITION ASSESSMENT  DOCUMENTATION CODES Per approved criteria  -Not Applicable   INTERVENTION: Provide Ensure Pudding once daily Provide a snack once daily Provide Ensure Complete once daily Encouraged PO intake as tolerated  NUTRITION DIAGNOSIS: Inadequate oral intake related to poor appetite as evidenced by pt's report of eating 50% less than usual for the past 3 weeks.   Goal: Pt to meet >/= 90% of their estimated nutrition needs   Monitor:  PO intake Weight Labs  Reason for Assessment: Malnutrition Screening Tool, score of 3  67 y.o. female  Admitting Dx: <principal problem not specified>  ASSESSMENT: 67 y.o. female with history of asthma and renal cell ca, status post partial nephrectomy, who presents with complaints of abdominal pain and diarrhea for the past 3 days. She states that the abdominal pain has been diffuse, severe and associated with multiple episodes of nonbloody diarrhea. She was seen in the ED and urinalysis is consistent with a UTI, CT scan of abdomen and pelvis showed findings consistent with right sided pyelonephritis, and chemistries revealed electrolytes abnormalities and WBCs elevated at 26. Pt states that she has had a poor appetite and been eating 50% less than usual for the past 3 weeks due to diarrhea and abdominal pain. Pt states she usually weighs 155-160 lbs and states now weighs 135 lbs. Pt weighed today on standing scale and weight is 161 lbs. Per nursing notes, pt ate 75% of breakfast. Lunch tray at bedside- pt ate salad and some fruit.   Nutrition Focused Physical Exam:  Subcutaneous Fat:  Orbital Region: wnl Upper Arm Region: mild wasting Thoracic and Lumbar Region: NA  Muscle:  Temple Region: wnl Clavicle Bone Region: wnl Clavicle and Acromion Bone Region: wnl Scapular Bone Region: wnl Dorsal Hand: mild wasting Patellar Region: wnl Anterior Thigh Region: wnl Posterior Calf Region: mild wasting  Edema:  intact   Height: Ht Readings from Last 1 Encounters:  09/23/13 5' 8.11" (1.73 m)    Weight: Wt Readings from Last 1 Encounters:  09/23/13 161 lb 2.5 oz (73.1 kg)    Ideal Body Weight: 140 lbs  % Ideal Body Weight: 115%  Wt Readings from Last 10 Encounters:  09/23/13 161 lb 2.5 oz (73.1 kg)  09/14/13 161 lb 1.6 oz (73.074 kg)  08/07/13 152 lb (68.947 kg)  02/23/13 160 lb (72.576 kg)  02/18/13 130 lb (58.968 kg)  12/05/11 176 lb 3.2 oz (79.924 kg)  01/12/08 176 lb (79.833 kg)  11/17/07 170 lb 4 oz (77.225 kg)  08/21/07 170 lb (77.111 kg)  07/07/07 164 lb 9.6 oz (74.662 kg)    Usual Body Weight: 160 lbs  % Usual Body Weight: 100%  BMI:  Body mass index is 24.42 kg/(m^2).  Estimated Nutritional Needs: Kcal: 1750-1950 Protein: 75-85 grams Fluid: 2-2.2 L/day  Skin: intact  Diet Order: General  EDUCATION NEEDS: -No education needs identified at this time   Intake/Output Summary (Last 24 hours) at 09/23/13 1506 Last data filed at 09/23/13 1049  Gross per 24 hour  Intake 1761.67 ml  Output      2 ml  Net 1759.67 ml    Last BM: 11/13, diarrhea   Labs:   Recent Labs Lab 09/22/13 1230 09/23/13 0534  NA 134* 137  K 4.2 4.5  CL 98 106  CO2 23 23  BUN 5* 4*  CREATININE 0.64 0.63  CALCIUM 9.2 8.2*  GLUCOSE 74 88    CBG (last 3)  No results found for this basename: GLUCAP,  in the last 72 hours  Scheduled Meds: . citalopram  10 mg Oral Daily  . enoxaparin (LOVENOX) injection  40 mg Subcutaneous Q24H  . ezetimibe  10 mg Oral Daily  . famotidine  20 mg Oral Daily  . fluticasone  2 spray Each Nare Daily  . levofloxacin  750 mg Oral Daily  . loratadine  10 mg Oral Daily  . mometasone-formoterol  2 puff Inhalation BID  . montelukast  10 mg Oral Daily  . simvastatin  40 mg Oral QPM    Continuous Infusions: . sodium chloride 100 mL (09/23/13 1349)    Past Medical History  Diagnosis Date  . Asthma   . Renal cell carcinoma 2012    left  .  Arthritis     Past Surgical History  Procedure Laterality Date  . Kidney surgery  12/2010    [facility]; partial nephrectomy  . Hernia repair  07/2011    supraumbilical repair  . Bunionectomy  04/2011  . Tubal ligation    . Abdominal hysterectomy    . Menisectomy      left knee  . Cholecystectomy  11/13/2011    Procedure: LAPAROSCOPIC CHOLECYSTECTOMY WITH INTRAOPERATIVE CHOLANGIOGRAM;  Surgeon: Rulon Abide, DO;  Location: [facility];  Service: General;  Laterality: N/A;    Ian Malkin RD, LDN Inpatient Clinical Dietitian Pager: [phone] After Hours Pager: [phone]

## 2013-09-23 NOTE — Consult Note (Addendum)
[facility]  Total days of antibiotics 13        Day 2 levofloxacin               Reason for Consult: HIV EIA positive    Referring Physician: chiu  Active Problems:   Diarrhea   Dehydration    HPI: [name] is a 67 y.o. female with  history of asthma and renal cell ca-status post partial nephrectomy who presents with complaints of abdominal pain and diarrhea for the past 3 weeks. She states that the abdominal pain has been diffuse, severe and associated with multiple episodes of nonbloody diarrhea.she has 4 bowel movements per day. She thinks that she has had associated weight loss with diarrhea. She denies nausea/vomiting.She denies any sick contacts. She was recently admitted from 10/31-11/04 for ecoli (pan sensitive except R amp) pyelonephritis. She was started on ceftriaxone and discharged on levofloxacin for a total of 14 days to end on 11/14. She was readmitted on 11/3 for having worsening diarrhea, but it is unclear how compliant she was to her antibiotics. No consumption of bad food of late. She also subscribed to 40Lb weight loss in comparison to weight measure 3 months ago. In readmission, hiv test was found to be positive with leukocytosis and left shift. Afebrile  She is a retired [employer], where she worked for 38 yr prior to retiring. 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 She has 3 children from previous marriage and remarried in 52. Her current husband has 4 children from a previous marriage.he is in fair health.   Past Medical History  Diagnosis Date  . Asthma   . Renal cell carcinoma 2012    left  . Arthritis     Allergies:  Allergies  Allergen Reactions  . Azithromycin Itching and Swelling    REACTION: unspecified    MEDICATIONS: . citalopram  10 mg Oral Daily  . enoxaparin (LOVENOX) injection  40 mg Subcutaneous Q24H  . ezetimibe  10 mg Oral Daily  . famotidine  20 mg Oral Daily  . [START ON 09/24/2013] feeding  supplement (ENSURE COMPLETE)  237 mL Oral Q24H  . feeding supplement (ENSURE)  1 Container Oral Q24H  . fluticasone  2 spray Each Nare Daily  . levofloxacin  750 mg Oral Daily  . loratadine  10 mg Oral Daily  . mometasone-formoterol  2 puff Inhalation BID  . montelukast  10 mg Oral Daily  . simvastatin  40 mg Oral QPM    History  Substance Use Topics  . Smoking status: Current Every Day Smoker -- 0.25 packs/day    Types: Cigarettes  . Smokeless tobacco: Never Used  . Alcohol Use: No     Comment: socially    History reviewed. No pertinent family history.   Review of Systems  Constitutional:positive for weight loss. Negative for fever, chills, diaphoresis, activity change, appetite change, fatigue and unexpected weight change.  HENT: Negative for congestion, sore throat, rhinorrhea, sneezing, trouble swallowing and sinus pressure.  Eyes: Negative for photophobia and visual disturbance.  Respiratory: Negative for cough, chest tightness, shortness of breath, wheezing and stridor.  Cardiovascular: Negative for chest pain, palpitations and leg swelling.  Gastrointestinal: positive for diarrhea and abdominal pain and flatulence. Negative for nausea, vomiting, constipation, blood in stool, abdominal distention and anal bleeding.  Genitourinary: Negative for dysuria, hematuria, flank pain and difficulty urinating.  Musculoskeletal: Negative for myalgias, back pain, joint swelling, arthralgias and gait problem.  Skin: Negative for color change, pallor, rash and wound.  Neurological: Negative for dizziness, tremors, weakness and light-headedness.  Hematological: Negative for adenopathy. Does not bruise/bleed easily.  Psychiatric/Behavioral: Negative for behavioral problems, confusion, sleep disturbance, dysphoric mood, decreased concentration and agitation.     OBJECTIVE: Temp:  [98.3 F (36.8 C)-98.7 F (37.1 C)] 98.3 F (36.8 C) (11/13 1500) Pulse Rate:  [87-98] 91 (11/13  1500) Resp:  [16-20] 18 (11/13 1500) BP: (105-145)/(60-79) 127/79 mmHg (11/13 1500) SpO2:  [97 %-99 %] 97 % (11/13 1500) Weight:  [161 lb 2.5 oz (73.1 kg)] 161 lb 2.5 oz (73.1 kg) (11/13 1300)  Physical Exam  Constitutional:  oriented to person, place, and time.appears well-developed and well-nourished. No distress.  HENT:  Mouth/Throat: Oropharynx is clear and moist. No oropharyngeal exudate.  Cardiovascular: Normal rate, regular rhythm and normal heart sounds. Exam reveals no gallop and no friction rub.  No murmur heard.  Pulmonary/Chest: Effort normal and breath sounds normal. No respiratory distress.  no wheezes.  Abdominal: Soft. Bowel sounds are normal. exhibits no distension. There is no tenderness.  Lymphadenopathy:   no cervical adenopathy.  Neurological:  alert and oriented to person, place, and time.  Skin: Skin is warm and dry. No rash noted. No erythema.  Psychiatric:  normal mood and affect. behavior is normal.    LABS: CBC    Component Value Date/Time   WBC 13.5* 09/23/2013 0534   RBC 2.64* 09/23/2013 0534   HGB 8.6* 09/23/2013 0534   HCT 25.9* 09/23/2013 0534   PLT 642* 09/23/2013 0534   MCV 98.1 09/23/2013 0534   MCH 32.6 09/23/2013 0534   MCHC 33.2 09/23/2013 0534   RDW 20.6* 09/23/2013 0534   LYMPHSABS 2.8 09/22/2013 1230   MONOABS 0.8 09/22/2013 1230   EOSABS 0.3 09/22/2013 1230   BASOSABS 0.2* 09/22/2013 1230    BMET    Component Value Date/Time   NA 137 09/23/2013 0534   K 4.5 09/23/2013 0534   CL 106 09/23/2013 0534   CO2 23 09/23/2013 0534   GLUCOSE 88 09/23/2013 0534   BUN 4* 09/23/2013 0534   CREATININE 0.63 09/23/2013 0534   CALCIUM 8.2* 09/23/2013 0534   GFRNONAA >90 09/23/2013 0534   GFRAA >90 09/23/2013 0534      MICRO: 11/12 hiv reactive 11/2 hcv negative 11/12 hbs ag negative  IMAGING: US Abdomen Limited Ruq  09/22/2013   CLINICAL DATA:  Abnormal liver function tests and history of cholecystectomy.  EXAM: US ABDOMEN  LIMITED - RIGHT UPPER QUADRANT  COMPARISON:  CT of the abdomen on 09/10/2013  FINDINGS: Gallbladder  Surgically absent. No abnormalities are identified in the gallbladder fossa.  Common bile duct  Diameter: Normal caliber of 6 mm.  Liver:  The liver is enlarged and diffusely heterogeneous and echogenic in appearance by ultrasound, consistent with steatosis. No masses are identified by ultrasound.  No ascites is identified in the right upper quadrant.  IMPRESSION: Hepatomegaly and hepatic steatosis. No evidence of biliary obstruction.   Electronically Signed   By: Irish Lack M.D.   On: 09/22/2013 19:51   Assessment/Plan:  67yo F with renal cell ca s/p nephrectomy recently treated for pyelonephritis who presents with diarrhea, leukocytosis, with cdifficile ruled out. 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 Check cd 4 count. And western blot to confirm hiv diagnosis  - await test results from viral load or western blot to discuss findings with patient. i have  not mentioned positive EIA at this time wondering if a chance of false positive  - complicated uti/pyelonephritis = last day of antibiotics is tomorrow to complete course of therapy  - for diarrhea, recommend to check crypstosporidium and giardia as other possible causes of presentation. Gi PCR panel pending. May need evalution by GI if diarrhea is not improving/if tests are negative.consider repeat imaging if work up is negative. Important to note that she did have abd CT on 10/31, when she was having diarrhea at that time that was negative for intra-abdominal issues.  Duke Salvia Drue Second MD MPH [facility] [phone]

## 2013-09-24 DIAGNOSIS — N179 Acute kidney failure, unspecified: Secondary | ICD-10-CM

## 2013-09-24 DIAGNOSIS — R109 Unspecified abdominal pain: Secondary | ICD-10-CM

## 2013-09-24 LAB — COMPREHENSIVE METABOLIC PANEL
ALT: 20 U/L (ref 0–35)
AST: 67 U/L — ABNORMAL HIGH (ref 0–37)
Albumin: 2.3 g/dL — ABNORMAL LOW (ref 3.5–5.2)
Alkaline Phosphatase: 336 U/L — ABNORMAL HIGH (ref 39–117)
CO2: 22 mEq/L (ref 19–32)
Calcium: 8.6 mg/dL (ref 8.4–10.5)
Chloride: 102 mEq/L (ref 96–112)
GFR calc Af Amer: >90 mL/min (ref >90)
GFR calc non Af Amer: >90 mL/min (ref >90)
Glucose, Bld: 89 mg/dL (ref 70–99)
Potassium: 4.1 mEq/L (ref 3.5–5.1)
Sodium: 136 mEq/L (ref 135–145)
Total Bilirubin: 0.5 mg/dL (ref 0.3–1.2)

## 2013-09-24 LAB — HIV 1/2 CONFIRMATION
HIV-1 antibody: NEGATIVE
HIV-2 Ab: NEGATIVE

## 2013-09-24 LAB — GIARDIA/CRYPTOSPORIDIUM SCREEN(EIA): Cryptosporidium Screen (EIA): NEGATIVE

## 2013-09-24 LAB — CD4/CD8 (T-HELPER/T-SUPPRESSOR CELL)
CD4%: 43 % (ref 30.0–60.0)
CD8 T Cell Abs: 520 /uL (ref 230–1000)
CD8tox: 22 % (ref 15.0–40.0)
Total lymphocyte count: 2340 /uL (ref 1000–4000)

## 2013-09-24 LAB — CBC WITH DIFFERENTIAL/PLATELET
Basophils Relative: 1 % (ref 0–1)
Eosinophils Absolute: 0.6 10*3/uL (ref 0.0–0.7)
Eosinophils Relative: 5 % (ref 0–5)
Hemoglobin: 9.2 g/dL — ABNORMAL LOW (ref 12.0–15.0)
MCH: 32.3 pg (ref 26.0–34.0)
MCHC: 31.7 g/dL (ref 30.0–36.0)
Monocytes Absolute: 0.8 10*3/uL (ref 0.1–1.0)
Monocytes Relative: 6 % (ref 3–12)
Neutrophils Relative %: 69 % (ref 43–77)
Platelets: 662 10*3/uL — ABNORMAL HIGH (ref 150–400)
RBC: 2.85 MIL/uL — ABNORMAL LOW (ref 3.87–5.11)
WBC: 12.7 10*3/uL — ABNORMAL HIGH (ref 4.0–10.5)

## 2013-09-24 LAB — HIV-1 RNA QUANT-NO REFLEX-BLD: HIV 1 RNA Quant: <20 copies/mL (ref <20)

## 2013-09-24 MED ORDER — MORPHINE SULFATE 2 MG/ML IJ SOLN
2.0000 mg | INTRAMUSCULAR | Status: DC | PRN
  Administered 2013-09-24 – 2013-09-29 (×12): 2 mg via INTRAVENOUS
  Filled 2013-09-24 (×12): qty 1

## 2013-09-24 MED ORDER — POLYETHYLENE GLYCOL 3350 17 GM/SCOOP PO POWD
1.0000 | Freq: Once | ORAL | Status: AC
  Administered 2013-09-24: 1 via ORAL
  Filled 2013-09-24: qty 255

## 2013-09-24 MED ORDER — SODIUM CHLORIDE 0.9 % IV SOLN
INTRAVENOUS | Status: DC
  Administered 2013-09-24: 19:00:00 via INTRAVENOUS
  Administered 2013-09-26: 20 mL/h via INTRAVENOUS

## 2013-09-24 NOTE — Progress Notes (Signed)
[facility] PROGRESS NOTE  [name]:[id number] DOB: Sep 13, 1946 DOA: 09/22/2013 PCP: [name], MD  Assessment/Plan: Diarrhea/dehydration  -Continue IV fluids  -C. difficile PCR neg -Repeat GI pathogen panel normal -Still complains of watery diarrhea, per ID, consider formal GI consult Transaminasemia  -RUQ [place] with no biliary obstruction -HIV pos -viral hepatitis serology neg -GGT elevated -UDS unremarkable -Fatty liver on imaging - May consider formal GI eval Hyponatremia  -mild  -likely volume depletion  -IV NS  Pyelonephritis  -continue levofloxacin  -repeat UA neg for pyuria  -abd exam benign  Asthma-  -Stable without wheezing  -contine home MDIs HIV - HIV genotype, cd4/cd8, cd4 count, WB confirmatory test pending - Appreciate ID recommendations  Code Status: Full Family Communication: Pt in room (indicate person spoken with, relationship, and if by phone, the number) Disposition Plan: Pending  Procedures:  Abd [place] 09/22/13  Antibiotics:  Levaquin 09/22/13  HPI/Subjective: No acute events noted overnight. Still reports watery diarrhea  Objective: Filed Vitals:   09/23/13 2056 09/24/13 0506 09/24/13 0700 09/24/13 0931  BP: 110/55 123/73 140/86   Pulse: 99 90 93   Temp: 98.5 F (36.9 C) 98.6 F (37 C) 98.4 F (36.9 C)   TempSrc: Oral Oral Oral   Resp: 18 18 18    Height:      Weight:      SpO2: 99% 98% 100% 100%    Intake/Output Summary (Last 24 hours) at 09/24/13 1117 Last data filed at 09/24/13 0800  Gross per 24 hour  Intake    840 ml  Output      7 ml  Net    833 ml   Filed Weights   09/23/13 1300  Weight: 73.1 kg (161 lb 2.5 oz)    Exam:  General:  Awake, in nad  Cardiovascular: regular, s1, s2  Respiratory: normal resp effort, no wheezing  Abdomen: soft, pos bs   Musculoskeletal: perfused, no clubbing   Data Reviewed: Basic Metabolic Panel:  Recent Labs Lab 09/22/13 1230 09/23/13 0534 09/24/13 0930  NA  134* 137 136  K 4.2 4.5 4.1  CL 98 106 102  CO2 23 23 22   GLUCOSE 74 88 89  BUN 5* 4* <3*  CREATININE 0.64 0.63 0.44*  CALCIUM 9.2 8.2* 8.6   Liver Function Tests:  Recent Labs Lab 09/22/13 1230 09/24/13 0930  AST 99* 67*  ALT 28 20  ALKPHOS 466* 336*  BILITOT 0.7 0.5  PROT 8.2 7.2  ALBUMIN 2.7* 2.3*   No results found for this basename: LIPASE, AMYLASE,  in the last 168 hours No results found for this basename: AMMONIA,  in the last 168 hours CBC:  Recent Labs Lab 09/22/13 1230 09/23/13 0534 09/24/13 0930  WBC 15.4* 13.5* 12.7*  NEUTROABS 11.3* 8.7* 8.8*  HGB 14.8 8.6* 9.2*  HCT 47.5* 25.9* 29.0*  MCV 98.3 98.1 101.8*  PLT 435* 642* 662*   Cardiac Enzymes: No results found for this basename: CKTOTAL, CKMB, CKMBINDEX, TROPONINI,  in the last 168 hours BNP (last 3 results) No results found for this basename: PROBNP,  in the last 8760 hours CBG: No results found for this basename: GLUCAP,  in the last 168 hours  Recent Results (from the past 240 hour(s))  CLOSTRIDIUM DIFFICILE BY PCR     Status: None   Collection Time    09/22/13  4:36 PM      Result Value Range Status   C difficile by pcr NEGATIVE  NEGATIVE Final  Comment: Performed at [facility]     Studies: US Abdomen Limited Ruq  10-07-13   CLINICAL DATA:  Abnormal liver function tests and history of cholecystectomy.  EXAM: US ABDOMEN LIMITED - RIGHT UPPER QUADRANT  COMPARISON:  CT of the abdomen on 09/10/2013  FINDINGS: Gallbladder  Surgically absent. No abnormalities are identified in the gallbladder fossa.  Common bile duct  Diameter: Normal caliber of 6 mm.  Liver:  The liver is enlarged and diffusely heterogeneous and echogenic in appearance by ultrasound, consistent with steatosis. No masses are identified by ultrasound.  No ascites is identified in the right upper quadrant.  IMPRESSION: Hepatomegaly and hepatic steatosis. No evidence of biliary obstruction.   Electronically Signed   By:  Irish Lack M.D.   On: 10/07/13 19:51    Scheduled Meds: . citalopram  10 mg Oral Daily  . enoxaparin (LOVENOX) injection  40 mg Subcutaneous Q24H  . ezetimibe  10 mg Oral Daily  . famotidine  20 mg Oral Daily  . feeding supplement (ENSURE COMPLETE)  237 mL Oral Q24H  . feeding supplement (ENSURE)  1 Container Oral Q24H  . fluticasone  2 spray Each Nare Daily  . levofloxacin  750 mg Oral Daily  . loratadine  10 mg Oral Daily  . mometasone-formoterol  2 puff Inhalation BID  . montelukast  10 mg Oral Daily  . simvastatin  40 mg Oral QPM   Continuous Infusions: . sodium chloride 100 mL/hr at 09/23/13 2325    Active Problems:   Diarrhea   Dehydration  Time spent:  Lynnlee Revels K  [facility] Pager [phone]. If 7PM-7AM, please contact night-coverage at [email or web address], password [facility] 09/24/2013, 11:17 AM  LOS: 2 days

## 2013-09-24 NOTE — Consult Note (Signed)
Referring Provider: Dr. Rhona Leavens Primary Care Physician:  [name], MD Primary Gastroenterologist:  Gentry Fitz  Reason for Consultation:  Diarrhea; Weight loss; Abdominal pain  HPI: [name] is a 67 y.o. female with PMH of renal cell cancer and s/p partial left nephrectomy readmitted on 09/22/13 with persistent diarrhea. She was hospitalized from 10/31 - 11/4 and the diarrhea and stool studies were negative. She went home on 11/4 and came back on 09/22/13 with continued diarrhea. Diarrhea is watery and having more than 5 times per day. Diarrhea for the past 3 weeks. Diarrhea happens after eating but also occurs unrelated to eating. Denies any rectal bleeding or hematochezia but recently has noticed blood that she attributes to a tear around her rectum with irritation of it. Has lost 20 pounds unintentionally over the last 3 weeks and she feels that she is eating MORE during this time because the weight loss is scaring her. Crampy upper abdominal pain that occurs after defecation and will last 10-15 minutes each time. Feels nauseous this evening and reports vomiting this evening but denies prior N/V during the past 3 weeks. Reports normal colonoscopy over 10 years ago. GI pathogen panel negative including negative C. Diff. HIV antibody positive with negative viral load. ID feels her HIV antibody is likely a false positive but western blot test pending. Recent pyelonephritis treated with antibiotics. Elevated ALP of 466, GGT 843, AST 97 with normal TB, ALT.     Past Medical History  Diagnosis Date  . Asthma   . Renal cell carcinoma 2012    left  . Arthritis     Past Surgical History  Procedure Laterality Date  . Kidney surgery  12/2010    [facility]; partial nephrectomy  . Hernia repair  07/2011    supraumbilical repair  . Bunionectomy  04/2011  . Tubal ligation    . Abdominal hysterectomy    . Menisectomy      left knee  . Cholecystectomy  11/13/2011    Procedure: LAPAROSCOPIC CHOLECYSTECTOMY  WITH INTRAOPERATIVE CHOLANGIOGRAM;  Surgeon: Rulon Abide, DO;  Location: [facility];  Service: General;  Laterality: N/A;    Prior to Admission medications   Medication Sig Start Date End Date Taking? Authorizing Provider  albuterol (PROVENTIL HFA;VENTOLIN HFA) 108 (90 BASE) MCG/ACT inhaler Inhale 2 puffs into the lungs every 6 (six) hours as needed. Wheezing and shortness of breath   Yes Historical Provider, MD  calcium-vitamin D (OSCAL WITH D) 500-200 MG-UNIT per tablet Take 1 tablet by mouth 2 (two) times daily.   Yes Historical Provider, MD  celecoxib (CELEBREX) 200 MG capsule Take 200 mg by mouth 2 (two) times daily.   Yes Historical Provider, MD  citalopram (CELEXA) 10 MG tablet Take 1 tablet (10 mg total) by mouth daily. 08/07/13  Yes Trevor Mace, PA-C  diphenoxylate-atropine (LOMOTIL) 2.5-0.025 MG per tablet Take 1 tablet by mouth 4 (four) times daily as needed for diarrhea or loose stools. 09/14/13  Yes Alison Murray, MD  ezetimibe (ZETIA) 10 MG tablet Take 1 tablet (10 mg total) by mouth daily. 08/07/13  Yes Trevor Mace, PA-C  famotidine (PEPCID) 20 MG tablet Take 20 mg by mouth daily.   Yes Historical Provider, MD  fluticasone (VERAMYST) 27.5 MCG/SPRAY nasal spray Place 2 sprays into the nose 2 (two) times daily.    Yes Historical Provider, MD  Fluticasone-Salmeterol (ADVAIR) 500-50 MCG/DOSE AEPB Inhale 1 puff into the lungs every 12 (twelve) hours. 08/07/13  Yes Trevor Mace,  PA-C  HYDROcodone-acetaminophen (NORCO) 5-325 MG per tablet Take 1 tablet by mouth every 6 (six) hours as needed for moderate pain. 09/14/13  Yes Alison Murray, MD  levocetirizine (XYZAL) 5 MG tablet Take 5 mg by mouth daily.    Yes Historical Provider, MD  levofloxacin (LEVAQUIN) 750 MG tablet Take 1 tablet (750 mg total) by mouth daily. 09/14/13  Yes Alison Murray, MD  methocarbamol (ROBAXIN) 500 MG tablet Take 500 mg by mouth 2 (two) times daily as needed (for muscle spasms).  02/18/13  Yes Roxy Horseman,  PA-C  montelukast (SINGULAIR) 10 MG tablet Take 1 tablet (10 mg total) by mouth daily. 08/07/13  Yes Trevor Mace, PA-C  simvastatin (ZOCOR) 40 MG tablet Take 1 tablet (40 mg total) by mouth every evening. 08/07/13  Yes Trevor Mace, PA-C  triamcinolone (NASACORT) 55 MCG/ACT nasal inhaler Place 2 sprays into the nose 2 (two) times daily.    Yes Historical Provider, MD  zolpidem (AMBIEN) 5 MG tablet Take 1 tablet (5 mg total) by mouth at bedtime as needed for sleep. 09/14/13  Yes Alison Murray, MD    Scheduled Meds: . citalopram  10 mg Oral Daily  . enoxaparin (LOVENOX) injection  40 mg Subcutaneous Q24H  . ezetimibe  10 mg Oral Daily  . famotidine  20 mg Oral Daily  . feeding supplement (ENSURE COMPLETE)  237 mL Oral Q24H  . feeding supplement (ENSURE)  1 Container Oral Q24H  . fluticasone  2 spray Each Nare Daily  . loratadine  10 mg Oral Daily  . mometasone-formoterol  2 puff Inhalation BID  . montelukast  10 mg Oral Daily  . polyethylene glycol powder  1 Container Oral Once  . simvastatin  40 mg Oral QPM   Continuous Infusions: . sodium chloride 100 mL/hr at 09/24/13 1743  . sodium chloride     PRN Meds:.acetaminophen, acetaminophen, ALPRAZolam, HYDROcodone-acetaminophen, morphine injection, ondansetron (ZOFRAN) IV, ondansetron, zolpidem  Allergies as of 09/22/2013 - Review Complete 09/22/2013  Allergen Reaction Noted  . Azithromycin Itching and Swelling 10/17/2006    History reviewed. No pertinent family history.  History   Social History  . Marital Status: Divorced    Spouse Name: N/A    Number of Children: N/A  . Years of Education: N/A   Occupational History  . Not on file.   Social History Main Topics  . Smoking status: Current Every Day Smoker -- 0.25 packs/day    Types: Cigarettes  . Smokeless tobacco: Never Used  . Alcohol Use: No     Comment: socially  . Drug Use: No  . Sexual Activity: No   Other Topics Concern  . Not on file   Social History  Narrative  . No narrative on file    Review of Systems: All negative except as stated above in HPI.  Physical Exam: Vital signs: Filed Vitals:   09/24/13 1426  BP: 121/74  Pulse: 96  Temp: 98.1 F (36.7 C)  Resp: 18   Last BM Date: 09/24/13 General:   Alert,  Well-developed, well-nourished, pleasant and cooperative in NAD HEENT: anicteric, oral cavity normal Neck: supple, nontender Lungs:  Clear throughout to auscultation.   No wheezes, crackles, or rhonchi. No acute distress. Heart:  Regular rate and rhythm; no murmurs, clicks, rubs,  or gallops. Abdomen: LLQ focal tenderness and epigastric focal tenderness without guarding, soft, nondistended, +BS, healed surgical scars Rectal:  Deferred (until colonoscopy) Ext: no edema Skin: no rash  GI:  Lab Results:  Recent Labs  09/22/13 1230 09/23/13 0534 09/24/13 0930  WBC 15.4* 13.5* 12.7*  HGB 14.8 8.6* 9.2*  HCT 47.5* 25.9* 29.0*  PLT 435* 642* 662*   BMET  Recent Labs  09/22/13 1230 09/23/13 0534 09/24/13 0930  NA 134* 137 136  K 4.2 4.5 4.1  CL 98 106 102  CO2 23 23 22   GLUCOSE 74 88 89  BUN 5* 4* <3*  CREATININE 0.64 0.63 0.44*  CALCIUM 9.2 8.2* 8.6   LFT  Recent Labs  09/24/13 0930  PROT 7.2  ALBUMIN 2.3*  AST 67*  ALT 20  ALKPHOS 336*  BILITOT 0.5   PT/INR No results found for this basename: LABPROT, INR,  in the last 72 hours   Studies/Results: US Abdomen Limited Ruq  09/22/2013   CLINICAL DATA:  Abnormal liver function tests and history of cholecystectomy.  EXAM: US ABDOMEN LIMITED - RIGHT UPPER QUADRANT  COMPARISON:  CT of the abdomen on 09/10/2013  FINDINGS: Gallbladder  Surgically absent. No abnormalities are identified in the gallbladder fossa.  Common bile duct  Diameter: Normal caliber of 6 mm.  Liver:  The liver is enlarged and diffusely heterogeneous and echogenic in appearance by ultrasound, consistent with steatosis. No masses are identified by ultrasound.  No ascites is  identified in the right upper quadrant.  IMPRESSION: Hepatomegaly and hepatic steatosis. No evidence of biliary obstruction.   Electronically Signed   By: Irish Lack M.D.   On: 09/22/2013 19:51    Impression/Plan: 68 yo with persistent watery diarrhea, weight loss, and recurrent abdominal pain without any infectious source found as of yet. Needs a colonoscopy to look for any obstructive processes and do biopsies for microscopic colitis. Pseudomembranous colitis still possible and will evaluate for that during the colonoscopy. CD4 normal and HIV viral load negative so diarrhea likely unrelated to that and it appears that her HIV Ab is a false positive (per ID notes). Will do EGD also to look for a source of her weight loss and to do duodenal biopsies to look for celiac sprue. Clears. Miralax prep. NPO p MN. Check autoimmune antibodies (AMA, ANA, ASMA) due to elevated ALP and GGT.    LOS: 2 days   Khyler Urda C.  09/24/2013, 6:04 PM

## 2013-09-24 NOTE — Progress Notes (Signed)
[facility]    Date of Admission:  09/22/2013   Total days of antibiotics 14        Day 3 levofloxacin           ID: [name] is a 67 y.o. female  with history of asthma and renal cell ca-status post partial nephrectomy who presents with complaints of abdominal pain and diarrhea for the past 3 weeks.  associated weight loss with diarrhea. She denies nausea/vomiting.She denies any sick contacts. She was recently admitted from 10/31-11/04 for ecoli (pan sensitive except R amp) pyelonephritis. She was started on ceftriaxone and discharged on levofloxacin for a total of 14 days to end on 11/14. Work up shows HIV EIA + thought to be false positive due to negative HIV viral load. Diarrhea work up is negative by pathogen PCR panel Active Problems:   Diarrhea   Dehydration    Subjective: Still having diarrhea and abdominal pain  Medications:  . citalopram  10 mg Oral Daily  . enoxaparin (LOVENOX) injection  40 mg Subcutaneous Q24H  . ezetimibe  10 mg Oral Daily  . famotidine  20 mg Oral Daily  . feeding supplement (ENSURE COMPLETE)  237 mL Oral Q24H  . feeding supplement (ENSURE)  1 Container Oral Q24H  . fluticasone  2 spray Each Nare Daily  . levofloxacin  750 mg Oral Daily  . loratadine  10 mg Oral Daily  . mometasone-formoterol  2 puff Inhalation BID  . montelukast  10 mg Oral Daily  . simvastatin  40 mg Oral QPM    Objective: Vital signs in last 24 hours: Temp:  [98.1 F (36.7 C)-98.6 F (37 C)] 98.1 F (36.7 C) (11/14 1426) Pulse Rate:  [90-99] 96 (11/14 1426) Resp:  [18] 18 (11/14 1426) BP: (110-140)/(55-86) 121/74 mmHg (11/14 1426) SpO2:  [98 %-100 %] 100 % (11/14 1426)  Constitutional: oriented to person, place, and time.appears well-developed and well-nourished. No distress.  HENT:  Mouth/Throat: Oropharynx is clear and moist. No oropharyngeal exudate.  Cardiovascular: Normal rate, regular rhythm and normal heart sounds. Exam reveals no  gallop and no friction rub.  No murmur heard.  Pulmonary/Chest: Effort normal and breath sounds normal. No respiratory distress. no wheezes.  Abdominal: Soft. Bowel sounds are normal. exhibits no distension. There is no tenderness.  Lymphadenopathy:  no cervical adenopathy.  Neurological: alert and oriented to person, place, and time.  Skin: Skin is warm and dry. No rash noted. No erythema.  Psychiatric: normal mood and affect. behavior is normal.   Lab Results  Recent Labs  09/23/13 0534 09/24/13 0930  WBC 13.5* 12.7*  HGB 8.6* 9.2*  HCT 25.9* 29.0*  NA 137 136  K 4.5 4.1  CL 106 102  CO2 23 22  BUN 4* <3*  CREATININE 0.63 0.44*   Liver Panel  Recent Labs  09/22/13 1230 09/24/13 0930  PROT 8.2 7.2  ALBUMIN 2.7* 2.3*  AST 99* 67*  ALT 28 20  ALKPHOS 466* 336*  BILITOT 0.7 0.5    Microbiology: Cd 4 count 1000, hiv vl < 20 (negative), stool studies crypto and giardia EIA negative. PCR panel negative Studies/Results: US Abdomen Limited Ruq  09/22/2013   CLINICAL DATA:  Abnormal liver function tests and history of cholecystectomy.  EXAM: US ABDOMEN LIMITED - RIGHT UPPER QUADRANT  COMPARISON:  CT of the abdomen on 09/10/2013  FINDINGS: Gallbladder  Surgically absent. No abnormalities are identified in the gallbladder fossa.  Common bile  duct  Diameter: Normal caliber of 6 mm.  Liver:  The liver is enlarged and diffusely heterogeneous and echogenic in appearance by ultrasound, consistent with steatosis. No masses are identified by ultrasound.  No ascites is identified in the right upper quadrant.  IMPRESSION: Hepatomegaly and hepatic steatosis. No evidence of biliary obstruction.   Electronically Signed   By: Irish Lack M.D.   On: 09/22/2013 19:51     Assessment/Plan: Chronic diarrhea = would check studies for celiac disease as possible cause of diarrhea. Consider getting GI consultation to see if it would be useful to get colonoscopy.   Positive HIV ELISA screen =  this is likely false positive since no detectable HIV virus found on viral load testing. Confirmation by western blot still pending.  Drue Second [facility] Cell: [phone] Pager: [phone]  09/24/2013, 4:55 PM

## 2013-09-25 ENCOUNTER — Encounter (HOSPITAL_COMMUNITY): Payer: Self-pay | Admitting: *Deleted

## 2013-09-25 ENCOUNTER — Encounter (HOSPITAL_COMMUNITY)
Admission: EM | Disposition: A | Discharge status: Home / Self Care | Payer: Self-pay | Source: Home / Self Care | Attending: Internal Medicine

## 2013-09-25 DIAGNOSIS — N390 Urinary tract infection, site not specified: Secondary | ICD-10-CM

## 2013-09-25 DIAGNOSIS — R634 Abnormal weight loss: Secondary | ICD-10-CM | POA: Diagnosis present

## 2013-09-25 DIAGNOSIS — R1084 Generalized abdominal pain: Secondary | ICD-10-CM | POA: Diagnosis present

## 2013-09-25 HISTORY — PX: ESOPHAGOGASTRODUODENOSCOPY: SHX5428

## 2013-09-25 HISTORY — PX: COLONOSCOPY: SHX5424

## 2013-09-25 LAB — COMPREHENSIVE METABOLIC PANEL
AST: 51 U/L — ABNORMAL HIGH (ref 0–37)
BUN: <3 mg/dL — ABNORMAL LOW (ref 6–23)
CO2: 26 mEq/L (ref 19–32)
Calcium: 8.3 mg/dL — ABNORMAL LOW (ref 8.4–10.5)
Chloride: 104 mEq/L (ref 96–112)
Creatinine, Ser: 0.44 mg/dL — ABNORMAL LOW (ref 0.50–1.10)
GFR calc Af Amer: >90 mL/min (ref >90)
GFR calc non Af Amer: >90 mL/min (ref >90)
Potassium: 3.7 mEq/L (ref 3.5–5.1)
Total Bilirubin: 0.5 mg/dL (ref 0.3–1.2)

## 2013-09-25 LAB — CBC WITH DIFFERENTIAL/PLATELET
Eosinophils Relative: 5 % (ref 0–5)
HCT: 27.3 % — ABNORMAL LOW (ref 36.0–46.0)
Hemoglobin: 8.7 g/dL — ABNORMAL LOW (ref 12.0–15.0)
Lymphocytes Relative: 22 % (ref 12–46)
Lymphs Abs: 2.9 10*3/uL (ref 0.7–4.0)
MCH: 32.3 pg (ref 26.0–34.0)
MCV: 101.5 fL — ABNORMAL HIGH (ref 78.0–100.0)
Monocytes Absolute: 0.8 10*3/uL (ref 0.1–1.0)
Monocytes Relative: 6 % (ref 3–12)
Neutro Abs: 8.9 10*3/uL — ABNORMAL HIGH (ref 1.7–7.7)
Neutrophils Relative %: 67 % (ref 43–77)
Platelets: 612 10*3/uL — ABNORMAL HIGH (ref 150–400)
RBC: 2.69 MIL/uL — ABNORMAL LOW (ref 3.87–5.11)
RDW: 21.2 % — ABNORMAL HIGH (ref 11.5–15.5)
WBC: 13.3 10*3/uL — ABNORMAL HIGH (ref 4.0–10.5)

## 2013-09-25 SURGERY — EGD (ESOPHAGOGASTRODUODENOSCOPY)
Anesthesia: Moderate Sedation

## 2013-09-25 MED ORDER — FENTANYL CITRATE 0.05 MG/ML IJ SOLN
INTRAMUSCULAR | Status: AC
  Filled 2013-09-25: qty 2

## 2013-09-25 MED ORDER — DIPHENHYDRAMINE HCL 50 MG/ML IJ SOLN
INTRAMUSCULAR | Status: AC
  Filled 2013-09-25: qty 1

## 2013-09-25 MED ORDER — FENTANYL CITRATE 0.05 MG/ML IJ SOLN
INTRAMUSCULAR | Status: DC | PRN
  Administered 2013-09-25 (×7): 25 ug via INTRAVENOUS

## 2013-09-25 MED ORDER — MIDAZOLAM HCL 10 MG/2ML IJ SOLN
INTRAMUSCULAR | Status: DC | PRN
  Administered 2013-09-25 (×8): 2.5 mg via INTRAVENOUS

## 2013-09-25 MED ORDER — BUTAMBEN-TETRACAINE-BENZOCAINE 2-2-14 % EX AERO
INHALATION_SPRAY | CUTANEOUS | Status: DC | PRN
  Administered 2013-09-25: 2 via TOPICAL

## 2013-09-25 MED ORDER — DIPHENHYDRAMINE HCL 50 MG/ML IJ SOLN
INTRAMUSCULAR | Status: DC | PRN
  Administered 2013-09-25 (×2): 25 mg via INTRAVENOUS

## 2013-09-25 MED ORDER — MIDAZOLAM HCL 10 MG/2ML IJ SOLN
INTRAMUSCULAR | Status: AC
  Filled 2013-09-25: qty 4

## 2013-09-25 NOTE — H&P (View-Only) (Signed)
Referring Provider: Dr. [name] Primary Care Physician:  DEAN, ERIC, MD Primary Gastroenterologist:  Unassigned  Reason for Consultation:  Diarrhea; Weight loss; Abdominal pain  HPI: [name] is a 67 y.o. female with PMH of renal cell cancer and s/p partial left nephrectomy readmitted on 09/22/13 with persistent diarrhea. She was hospitalized from 10/31 - 11/4 and the diarrhea and stool studies were negative. She went home on 11/4 and came back on 09/22/13 with continued diarrhea. Diarrhea is watery and having more than 5 times per day. Diarrhea for the past 3 weeks. Diarrhea happens after eating but also occurs unrelated to eating. Denies any rectal bleeding or hematochezia but recently has noticed blood that she attributes to a tear around her rectum with irritation of it. Has lost 20 pounds unintentionally over the last 3 weeks and she feels that she is eating MORE during this time because the weight loss is scaring her. Crampy upper abdominal pain that occurs after defecation and will last 10-15 minutes each time. Feels nauseous this evening and reports vomiting this evening but denies prior N/V during the past 3 weeks. Reports normal colonoscopy over 10 years ago. GI pathogen panel negative including negative C. Diff. HIV antibody positive with negative viral load. ID feels her HIV antibody is likely a false positive but western blot test pending. Recent pyelonephritis treated with antibiotics. Elevated ALP of 466, GGT 843, AST 97 with normal TB, ALT.     Past Medical History  Diagnosis Date  . Asthma   . Renal cell carcinoma 2012    left  . Arthritis     Past Surgical History  Procedure Laterality Date  . Kidney surgery  12/2010    [facility]; partial nephrectomy  . Hernia repair  07/2011    supraumbilical repair  . Bunionectomy  04/2011  . Tubal ligation    . Abdominal hysterectomy    . Menisectomy      left knee  . Cholecystectomy  11/13/2011    Procedure: LAPAROSCOPIC CHOLECYSTECTOMY  WITH INTRAOPERATIVE CHOLANGIOGRAM;  Surgeon: Brian David Layton, DO;  Location: [facility];  Service: General;  Laterality: N/A;    Prior to Admission medications   Medication Sig Start Date End Date Taking? Authorizing Provider  albuterol (PROVENTIL HFA;VENTOLIN HFA) 108 (90 BASE) MCG/ACT inhaler Inhale 2 puffs into the lungs every 6 (six) hours as needed. Wheezing and shortness of breath   Yes Historical Provider, MD  calcium-vitamin D (OSCAL WITH D) 500-200 MG-UNIT per tablet Take 1 tablet by mouth 2 (two) times daily.   Yes Historical Provider, MD  celecoxib (CELEBREX) 200 MG capsule Take 200 mg by mouth 2 (two) times daily.   Yes Historical Provider, MD  citalopram (CELEXA) 10 MG tablet Take 1 tablet (10 mg total) by mouth daily. 08/07/13  Yes Robyn M Albert, PA-C  diphenoxylate-atropine (LOMOTIL) 2.5-0.025 MG per tablet Take 1 tablet by mouth 4 (four) times daily as needed for diarrhea or loose stools. 09/14/13  Yes Alma M Devine, MD  ezetimibe (ZETIA) 10 MG tablet Take 1 tablet (10 mg total) by mouth daily. 08/07/13  Yes Robyn M Albert, PA-C  famotidine (PEPCID) 20 MG tablet Take 20 mg by mouth daily.   Yes Historical Provider, MD  fluticasone (VERAMYST) 27.5 MCG/SPRAY nasal spray Place 2 sprays into the nose 2 (two) times daily.    Yes Historical Provider, MD  Fluticasone-Salmeterol (ADVAIR) 500-50 MCG/DOSE AEPB Inhale 1 puff into the lungs every 12 (twelve) hours. 08/07/13  Yes Robyn M Albert,   PA-C  HYDROcodone-acetaminophen (NORCO) 5-325 MG per tablet Take 1 tablet by mouth every 6 (six) hours as needed for moderate pain. 09/14/13  Yes Alma M Devine, MD  levocetirizine (XYZAL) 5 MG tablet Take 5 mg by mouth daily.    Yes Historical Provider, MD  levofloxacin (LEVAQUIN) 750 MG tablet Take 1 tablet (750 mg total) by mouth daily. 09/14/13  Yes Alma M Devine, MD  methocarbamol (ROBAXIN) 500 MG tablet Take 500 mg by mouth 2 (two) times daily as needed (for muscle spasms).  02/18/13  Yes Robert Browning,  PA-C  montelukast (SINGULAIR) 10 MG tablet Take 1 tablet (10 mg total) by mouth daily. 08/07/13  Yes Robyn M Albert, PA-C  simvastatin (ZOCOR) 40 MG tablet Take 1 tablet (40 mg total) by mouth every evening. 08/07/13  Yes Robyn M Albert, PA-C  triamcinolone (NASACORT) 55 MCG/ACT nasal inhaler Place 2 sprays into the nose 2 (two) times daily.    Yes Historical Provider, MD  zolpidem (AMBIEN) 5 MG tablet Take 1 tablet (5 mg total) by mouth at bedtime as needed for sleep. 09/14/13  Yes Alma M Devine, MD    Scheduled Meds: . citalopram  10 mg Oral Daily  . enoxaparin (LOVENOX) injection  40 mg Subcutaneous Q24H  . ezetimibe  10 mg Oral Daily  . famotidine  20 mg Oral Daily  . feeding supplement (ENSURE COMPLETE)  237 mL Oral Q24H  . feeding supplement (ENSURE)  1 Container Oral Q24H  . fluticasone  2 spray Each Nare Daily  . loratadine  10 mg Oral Daily  . mometasone-formoterol  2 puff Inhalation BID  . montelukast  10 mg Oral Daily  . polyethylene glycol powder  1 Container Oral Once  . simvastatin  40 mg Oral QPM   Continuous Infusions: . sodium chloride 100 mL/hr at 09/24/13 1743  . sodium chloride     PRN Meds:.acetaminophen, acetaminophen, ALPRAZolam, HYDROcodone-acetaminophen, morphine injection, ondansetron (ZOFRAN) IV, ondansetron, zolpidem  Allergies as of 09/22/2013 - Review Complete 09/22/2013  Allergen Reaction Noted  . Azithromycin Itching and Swelling 10/17/2006    History reviewed. No pertinent family history.  History   Social History  . Marital Status: Divorced    Spouse Name: N/A    Number of Children: N/A  . Years of Education: N/A   Occupational History  . Not on file.   Social History Main Topics  . Smoking status: Current Every Day Smoker -- 0.25 packs/day    Types: Cigarettes  . Smokeless tobacco: Never Used  . Alcohol Use: No     Comment: socially  . Drug Use: No  . Sexual Activity: No   Other Topics Concern  . Not on file   Social History  Narrative  . No narrative on file    Review of Systems: All negative except as stated above in HPI.  Physical Exam: Vital signs: Filed Vitals:   09/24/13 1426  BP: 121/74  Pulse: 96  Temp: 98.1 F (36.7 C)  Resp: 18   Last BM Date: 09/24/13 General:   Alert,  Well-developed, well-nourished, pleasant and cooperative in NAD HEENT: anicteric, oral cavity normal Neck: supple, nontender Lungs:  Clear throughout to auscultation.   No wheezes, crackles, or rhonchi. No acute distress. Heart:  Regular rate and rhythm; no murmurs, clicks, rubs,  or gallops. Abdomen: LLQ focal tenderness and epigastric focal tenderness without guarding, soft, nondistended, +BS, healed surgical scars Rectal:  Deferred (until colonoscopy) Ext: no edema Skin: no rash  GI:    Lab Results:  Recent Labs  09/22/13 1230 09/23/13 0534 09/24/13 0930  WBC 15.4* 13.5* 12.7*  HGB 14.8 8.6* 9.2*  HCT 47.5* 25.9* 29.0*  PLT 435* 642* 662*   BMET  Recent Labs  09/22/13 1230 09/23/13 0534 09/24/13 0930  NA 134* 137 136  K 4.2 4.5 4.1  CL 98 106 102  CO2 23 23 22  GLUCOSE 74 88 89  BUN 5* 4* <3*  CREATININE 0.64 0.63 0.44*  CALCIUM 9.2 8.2* 8.6   LFT  Recent Labs  09/24/13 0930  PROT 7.2  ALBUMIN 2.3*  AST 67*  ALT 20  ALKPHOS 336*  BILITOT 0.5   PT/INR No results found for this basename: LABPROT, INR,  in the last 72 hours   Studies/Results: Us Abdomen Limited Ruq  09/22/2013   CLINICAL DATA:  Abnormal liver function tests and history of cholecystectomy.  EXAM: US ABDOMEN LIMITED - RIGHT UPPER QUADRANT  COMPARISON:  CT of the abdomen on 09/10/2013  FINDINGS: Gallbladder  Surgically absent. No abnormalities are identified in the gallbladder fossa.  Common bile duct  Diameter: Normal caliber of 6 mm.  Liver:  The liver is enlarged and diffusely heterogeneous and echogenic in appearance by ultrasound, consistent with steatosis. No masses are identified by ultrasound.  No ascites is  identified in the right upper quadrant.  IMPRESSION: Hepatomegaly and hepatic steatosis. No evidence of biliary obstruction.   Electronically Signed   By: Glenn  Yamagata M.D.   On: 09/22/2013 19:51    Impression/Plan: 67 yo with persistent watery diarrhea, weight loss, and recurrent abdominal pain without any infectious source found as of yet. Needs a colonoscopy to look for any obstructive processes and do biopsies for microscopic colitis. Pseudomembranous colitis still possible and will evaluate for that during the colonoscopy. CD4 normal and HIV viral load negative so diarrhea likely unrelated to that and it appears that her HIV Ab is a false positive (per ID notes). Will do EGD also to look for a source of her weight loss and to do duodenal biopsies to look for celiac sprue. Clears. Miralax prep. NPO p MN. Check autoimmune antibodies (AMA, ANA, ASMA) due to elevated ALP and GGT.    LOS: 2 days   Smaran Gaus C.  09/24/2013, 6:04 PM   

## 2013-09-25 NOTE — Op Note (Signed)
[facility] [address] [place], [address]   ENDOSCOPY PROCEDURE REPORT  PATIENT: [name], [name]  MR#: [id number] BIRTHDATE: [birth year] , 67  yrs. old GENDER: Female  ENDOSCOPIST: Charlott Rakes, MD REFERRED [id number] team  PROCEDURE DATE:  09/25/2013 PROCEDURE:   EGD w/ biopsy ASA CLASS:   Class III INDICATIONS:Weight loss.   abdominal pain.   Chronic diarrhea. MEDICATIONS: Fentanyl 25 mcg IV, Versed 2.5 mg IV, and there was residual sedation effect present from prior procedure.  TOPICAL ANESTHETIC:  DESCRIPTION OF PROCEDURE:   After the risks benefits and alternatives of the procedure were thoroughly explained, informed consent was obtained.  The PENTAX GASTOROSCOPE [id number]  endoscope was introduced through the mouth and advanced to the second portion of the duodenum , limited by Without limitations.   The instrument was slowly withdrawn as the mucosa was fully examined.     FINDINGS: The endoscope was inserted into the oropharynx and esophagus was intubated.  The esophagus was normal. The gastroesophageal junction was noted to be 40 cm from the incisors. Endoscope was advanced into the stomach, which revealed an 8mm cratered ulcer that is clean-based. Surrounding edema and erythema noted.   The endoscope was advanced to the duodenal bulb and second portion of duodenum which were unremarkable.  Clear bilious fluid noted in the duodenum. Biopsies taken in the examined duodenum to check for celiac sprue. The endoscope was withdrawn back into the stomach and biopsies taken of the edematous antral mucosa. Retroflexion revealed a tiny hiatal hernia.  COMPLICATIONS: None  ENDOSCOPIC IMPRESSION:     1. Antral ulcer - see above 2. Tiny hiatal hernia 3. S/P duodenal biopsies to check for celiac sprue  RECOMMENDATIONS: Start IV PPI Q 12 hours; Supportive care; Advance diet; F/U on path   REPEAT EXAM: N/A  _______________________________ Charlott Rakes, MD eSigned:  Charlott Rakes, MD 09/25/2013 7:06 PM    CC:  PATIENT NAME:  [name], [name] MR#: [id number]

## 2013-09-25 NOTE — Progress Notes (Signed)
[facility] PROGRESS NOTE  [name] ZOX:[id number] DOB: 10/31/1946 DOA: 09/22/2013 PCP: [name], MD  Assessment/Plan: Diarrhea/dehydration  -Continue IV fluids  -C. difficile PCR neg -Repeat GI pathogen panel normal -Still complains of watery diarrhea -Appreciate GI input - plans for endoscopy Transaminasemia  -RUQ [place] with no biliary obstruction -Rapid HIV pos, but confirmatory WB NEGATIVE -viral hepatitis serology neg -GGT elevated -UDS unremarkable -Fatty liver on imaging Hyponatremia  -mild  -likely volume depletion  -IV NS  Pyelonephritis  -continue levofloxacin  -repeat UA neg for pyuria  -abd exam benign  Asthma-  -Stable without wheezing  -contine home MDIs Pos rapid HIV - Confirmatory WB is NEGATIVE - Appreciate ID recommendations  Code Status: Full Family Communication: Pt in room (indicate person spoken with, relationship, and if by phone, the number) Disposition Plan: Pending  Procedures:  Abd [place] 09/22/13  Antibiotics:  Levaquin 09/22/13  HPI/Subjective: No acute events noted overnight. Still reports watery diarrhea  Objective: Filed Vitals:   09/24/13 2017 09/24/13 2145 09/25/13 0549 09/25/13 1012  BP:  120/74 110/71   Pulse:  103 89   Temp:  97.9 F (36.6 C) 97.7 F (36.5 C)   TempSrc:  Oral Oral   Resp:  18 18   Height:      Weight:      SpO2: 99% 98% 99% 100%    Intake/Output Summary (Last 24 hours) at 09/25/13 1140 Last data filed at 09/25/13 0600  Gross per 24 hour  Intake    600 ml  Output      0 ml  Net    600 ml   Filed Weights   09/23/13 1300  Weight: 73.1 kg (161 lb 2.5 oz)    Exam:  General:  Awake, in nad  Cardiovascular: regular, s1, s2  Respiratory: normal resp effort, no wheezing  Abdomen: soft, pos bs   Musculoskeletal: perfused, no clubbing   Data Reviewed: Basic Metabolic Panel:  Recent Labs Lab 09/22/13 1230 09/23/13 0534 09/24/13 0930 09/25/13 0506  NA 134* 137 136 139  K 4.2  4.5 4.1 3.7  CL 98 106 102 104  CO2 23 23 22 26   GLUCOSE 74 88 89 97  BUN 5* 4* <3* <3*  CREATININE 0.64 0.63 0.44* 0.44*  CALCIUM 9.2 8.2* 8.6 8.3*   Liver Function Tests:  Recent Labs Lab 09/22/13 1230 09/24/13 0930 09/25/13 0506  AST 99* 67* 51*  ALT 28 20 18   ALKPHOS 466* 336* 297*  BILITOT 0.7 0.5 0.5  PROT 8.2 7.2 6.7  ALBUMIN 2.7* 2.3* 2.2*   No results found for this basename: LIPASE, AMYLASE,  in the last 168 hours No results found for this basename: AMMONIA,  in the last 168 hours CBC:  Recent Labs Lab 09/22/13 1230 09/23/13 0534 09/24/13 0930 09/25/13 0506  WBC 15.4* 13.5* 12.7* 13.3*  NEUTROABS 11.3* 8.7* 8.8* 8.9*  HGB 14.8 8.6* 9.2* 8.7*  HCT 47.5* 25.9* 29.0* 27.3*  MCV 98.3 98.1 101.8* 101.5*  PLT 435* 642* 662* 612*   Cardiac Enzymes: No results found for this basename: CKTOTAL, CKMB, CKMBINDEX, TROPONINI,  in the last 168 hours BNP (last 3 results) No results found for this basename: PROBNP,  in the last 8760 hours CBG: No results found for this basename: GLUCAP,  in the last 168 hours  Recent Results (from the past 240 hour(s))  CLOSTRIDIUM DIFFICILE BY PCR     Status: None   Collection Time    09/22/13  4:36 PM  Result Value Range Status   C difficile by pcr NEGATIVE  NEGATIVE Final   Comment: Performed at [facility]     Studies: No results found.  Scheduled Meds: . citalopram  10 mg Oral Daily  . enoxaparin (LOVENOX) injection  40 mg Subcutaneous Q24H  . ezetimibe  10 mg Oral Daily  . famotidine  20 mg Oral Daily  . feeding supplement (ENSURE COMPLETE)  237 mL Oral Q24H  . feeding supplement (ENSURE)  1 Container Oral Q24H  . fluticasone  2 spray Each Nare Daily  . loratadine  10 mg Oral Daily  . mometasone-formoterol  2 puff Inhalation BID  . montelukast  10 mg Oral Daily  . simvastatin  40 mg Oral QPM   Continuous Infusions: . sodium chloride 100 mL/hr at 09/24/13 1743  . sodium chloride 10 mL/hr at 09/24/13  1914    Active Problems:   Diarrhea   Dehydration  Time spent:  [name], [name]  [facility] Pager [phone]. If 7PM-7AM, please contact night-coverage at [email or web address], password [facility] 09/25/2013, 11:40 AM  LOS: 3 days

## 2013-09-25 NOTE — Interval H&P Note (Signed)
History and Physical Interval Note:  09/25/2013 5:34 PM  [name]  has presented today for surgery, with the diagnosis of diarrhea  The various methods of treatment have been discussed with the patient and family. After consideration of risks, benefits and other options for treatment, the patient has consented to  Procedure(s): ESOPHAGOGASTRODUODENOSCOPY (EGD) (N/A) COLONOSCOPY (N/A) as a surgical intervention .  The patient's history has been reviewed, patient examined, no change in status, stable for surgery.  I have reviewed the patient's chart and labs.  Questions were answered to the patient's satisfaction.     Jeselle Hiser C.

## 2013-09-25 NOTE — Op Note (Signed)
[facility] [address] [place], [address]   COLONOSCOPY PROCEDURE REPORT  PATIENT: [name], [name]  MR#: [id number] BIRTHDATE: [birth year] , 67  yrs. old GENDER: Female ENDOSCOPIST: Charlott Rakes, MD REFERRED AO:[id number] team PROCEDURE DATE:  09/25/2013 PROCEDURE:   Colonoscopy with snare polypectomy ASA CLASS:   Class III INDICATIONS:chronic diarrhea, Weight loss, and Abdominal pain. MEDICATIONS: Fentanyl 150 mcg IV, Versed-Detailed 17.5 mg IV, and Diphenhydramine (Benadryl) 50 mg IV  DESCRIPTION OF PROCEDURE:   After the risks benefits and alternatives of the procedure were thoroughly explained, informed consent was obtained.  The     endoscope was introduced through the anus and advanced to the cecum, which was identified by both the appendix and ileocecal valve , limited by No adverse events experienced.   The quality of the prep was poor. .  The instrument was then slowly withdrawn as the colon was fully examined.     FINDINGS:  Rectal exam unremarkable.  Pediatric colonoscope inserted into the colon and advanced to the cecum, where the appendiceal orifice and ileocecal valve were identified.   In the cecum a 1.5 cm semi-sessile polyp was noted that was removed with snare cautery in a piecemeal fashion. Small amount of bleeding occurred during polypectomy that resolved. Visualized mucosa normal in appearance. Large amount of semi-solid stool noted in the transverse and left side of colon preventing adequate visualization of the colon. Random biopsies taken to check for microscopic colitis. Scattered small sigmoid diverticuli noted. Retroflexion revealed small internal hemorrhoids.  COMPLICATIONS: None  IMPRESSION:     1. Colon polyp - s/p snare cautery (see above) 2. Internal hemorrhoids 3. Sigmoid diverticulosis 4. Limited view of colon due to inadequate prep 5. Random biopsies done to check for microscopic colitis  RECOMMENDATIONS:  Advance  diet; F/U on path    ______________________________ eSigned:  Charlott Rakes, MD 09/25/2013 6:57 PM   CC:  PATIENT NAME:  [name], [name] MR#: [id number]

## 2013-09-26 DIAGNOSIS — R634 Abnormal weight loss: Secondary | ICD-10-CM

## 2013-09-26 LAB — COMPREHENSIVE METABOLIC PANEL
ALT: 17 U/L (ref 0–35)
AST: 49 U/L — ABNORMAL HIGH (ref 0–37)
Albumin: 2.2 g/dL — ABNORMAL LOW (ref 3.5–5.2)
Alkaline Phosphatase: 290 U/L — ABNORMAL HIGH (ref 39–117)
Calcium: 8.5 mg/dL (ref 8.4–10.5)
Creatinine, Ser: 0.42 mg/dL — ABNORMAL LOW (ref 0.50–1.10)
GFR calc Af Amer: >90 mL/min (ref >90)
Glucose, Bld: 96 mg/dL (ref 70–99)
Potassium: 4.4 mEq/L (ref 3.5–5.1)
Sodium: 138 mEq/L (ref 135–145)
Total Protein: 6.8 g/dL (ref 6.0–8.3)

## 2013-09-26 LAB — CBC
MCH: 33.3 pg (ref 26.0–34.0)
MCHC: 32.7 g/dL (ref 30.0–36.0)
MCV: 101.9 fL — ABNORMAL HIGH (ref 78.0–100.0)
Platelets: 619 10*3/uL — ABNORMAL HIGH (ref 150–400)
RDW: 21.6 % — ABNORMAL HIGH (ref 11.5–15.5)

## 2013-09-26 MED ORDER — PANTOPRAZOLE SODIUM 40 MG IV SOLR
40.0000 mg | Freq: Two times a day (BID) | INTRAVENOUS | Status: DC
  Administered 2013-09-26 – 2013-09-29 (×7): 40 mg via INTRAVENOUS
  Filled 2013-09-26 (×10): qty 40

## 2013-09-26 MED ORDER — CYCLOBENZAPRINE HCL 5 MG PO TABS
5.0000 mg | ORAL_TABLET | Freq: Three times a day (TID) | ORAL | Status: DC | PRN
  Administered 2013-09-26: 5 mg via ORAL
  Filled 2013-09-26: qty 1

## 2013-09-26 NOTE — Progress Notes (Signed)
Patient ID: [name], female   DOB: [birth year], 67 y.o.   MRN: [id number] [facility] Progress Note  [name] 67 y.o. 1946/04/10   Subjective: Has seen blood around her anus this morning that she feels is from a fissure. Doing ok otherwise but still having diarrhea.  Objective: Vital signs in last 24 hours: Filed Vitals:   09/26/13 0534  BP: 115/71  Pulse: 93  Temp: 98.8 F (37.1 C)  Resp: 16    Physical Exam: Gen: alert, no acute distress  Lab Results:  Recent Labs  09/25/13 0506 09/26/13 0519  NA 139 138  K 3.7 4.4  CL 104 102  CO2 26 27  GLUCOSE 97 96  BUN <3* 3*  CREATININE 0.44* 0.42*  CALCIUM 8.3* 8.5    Recent Labs  09/25/13 0506 09/26/13 0519  AST 51* 49*  ALT 18 17  ALKPHOS 297* 290*  BILITOT 0.5 0.4  PROT 6.7 6.8  ALBUMIN 2.2* 2.2*    Recent Labs  09/24/13 0930 09/25/13 0506 09/26/13 0519  WBC 12.7* 13.3* 15.8*  NEUTROABS 8.8* 8.9*  --   HGB 9.2* 8.7* 8.7*  HCT 29.0* 27.3* 26.6*  MCV 101.8* 101.5* 101.9*  PLT 662* 612* 619*   No results found for this basename: LABPROT, INR,  in the last 72 hours    Assessment/Plan: Chronic diarrhea - awaiting biopsies; continue supportive care Gastric ulcer - continue IV PPI Q 12hours Nutrition - on regular diet    [name] C. 09/26/2013, 12:40 PM

## 2013-09-26 NOTE — Progress Notes (Signed)
[facility] PROGRESS NOTE  JIA DOTTAVIO WUJ:[id number] DOB: 02/03/1946 DOA: 09/22/2013 PCP: [name], MD  Assessment/Plan: Diarrhea/dehydration  -Continue IV fluids  -C. difficile PCR neg -Repeat GI pathogen panel normal -Still complains of watery diarrhea -Appreciate GI input - s/p endoscopy biopsy results pending Transaminasemia  -RUQ [place] with no biliary obstruction -Rapid HIV pos, but confirmatory WB NEGATIVE -viral hepatitis serology neg -GGT elevated -UDS unremarkable -Fatty liver on imaging Hyponatremia  -mild  -likely volume depletion  -IV NS  Pyelonephritis  -continue levofloxacin  -repeat UA neg for pyuria  -abd exam benign  Asthma-  -Stable without wheezing  -contine home MDIs Recent pos rapid HIV - Confirmatory WB is NEGATIVE - Appreciate ID recommendations  Code Status: Full Family Communication: Pt in room (indicate person spoken with, relationship, and if by phone, the number) Disposition Plan: Pending  Procedures:  Abd [place] 09/22/13  Antibiotics:  Levaquin 09/22/13  HPI/Subjective: No acute events noted overnight. S/p colonoscopy and EGD  Objective: Filed Vitals:   09/25/13 1850 09/25/13 1900 09/25/13 2249 09/26/13 0534  BP: 145/86  123/62 115/71  Pulse: 90  96 93  Temp:   98.3 F (36.8 C) 98.8 F (37.1 C)  TempSrc:   Oral Oral  Resp: 16  16 16   Height:      Weight:      SpO2: 100% 100% 96% 97%   No intake or output data in the 24 hours ending 09/26/13 0843 Filed Weights   09/23/13 1300  Weight: 73.1 kg (161 lb 2.5 oz)    Exam:  General:  Awake, in nad  Cardiovascular: regular, s1, s2  Respiratory: normal resp effort, no wheezing  Abdomen: soft, pos bs   Musculoskeletal: perfused, no clubbing   Data Reviewed: Basic Metabolic Panel:  Recent Labs Lab 09/22/13 1230 09/23/13 0534 09/24/13 0930 09/25/13 0506 09/26/13 0519  NA 134* 137 136 139 138  K 4.2 4.5 4.1 3.7 4.4  CL 98 106 102 104 102  CO2 23 23 22 26  27   GLUCOSE 74 88 89 97 96  BUN 5* 4* <3* <3* 3*  CREATININE 0.64 0.63 0.44* 0.44* 0.42*  CALCIUM 9.2 8.2* 8.6 8.3* 8.5   Liver Function Tests:  Recent Labs Lab 09/22/13 1230 09/24/13 0930 09/25/13 0506 09/26/13 0519  AST 99* 67* 51* 49*  ALT 28 20 18 17   ALKPHOS 466* 336* 297* 290*  BILITOT 0.7 0.5 0.5 0.4  PROT 8.2 7.2 6.7 6.8  ALBUMIN 2.7* 2.3* 2.2* 2.2*   No results found for this basename: LIPASE, AMYLASE,  in the last 168 hours No results found for this basename: AMMONIA,  in the last 168 hours CBC:  Recent Labs Lab 09/22/13 1230 09/23/13 0534 09/24/13 0930 09/25/13 0506 09/26/13 0519  WBC 15.4* 13.5* 12.7* 13.3* 15.8*  NEUTROABS 11.3* 8.7* 8.8* 8.9*  --   HGB 14.8 8.6* 9.2* 8.7* 8.7*  HCT 47.5* 25.9* 29.0* 27.3* 26.6*  MCV 98.3 98.1 101.8* 101.5* 101.9*  PLT 435* 642* 662* 612* 619*   Cardiac Enzymes: No results found for this basename: CKTOTAL, CKMB, CKMBINDEX, TROPONINI,  in the last 168 hours BNP (last 3 results) No results found for this basename: PROBNP,  in the last 8760 hours CBG: No results found for this basename: GLUCAP,  in the last 168 hours  Recent Results (from the past 240 hour(s))  CLOSTRIDIUM DIFFICILE BY PCR     Status: None   Collection Time    09/22/13  4:36 PM  Result Value Range Status   C difficile by pcr NEGATIVE  NEGATIVE Final   Comment: Performed at [facility]     Studies: No results found.  Scheduled Meds: . citalopram  10 mg Oral Daily  . enoxaparin (LOVENOX) injection  40 mg Subcutaneous Q24H  . ezetimibe  10 mg Oral Daily  . famotidine  20 mg Oral Daily  . feeding supplement (ENSURE COMPLETE)  237 mL Oral Q24H  . feeding supplement (ENSURE)  1 Container Oral Q24H  . fluticasone  2 spray Each Nare Daily  . loratadine  10 mg Oral Daily  . mometasone-formoterol  2 puff Inhalation BID  . montelukast  10 mg Oral Daily  . simvastatin  40 mg Oral QPM   Continuous Infusions: . sodium chloride 100 mL/hr  at 09/24/13 1743  . sodium chloride 10 mL/hr at 09/24/13 1848    Active Problems:   Diarrhea   Dehydration   Abnormal loss of weight   Generalized abdominal pain  Time spent:  [name], [name]  [facility] Pager [phone]. If 7PM-7AM, please contact night-coverage at [email or web address], password [facility] 09/26/2013, 8:43 AM  LOS: 4 days

## 2013-09-27 ENCOUNTER — Encounter (HOSPITAL_COMMUNITY): Payer: Self-pay | Admitting: Gastroenterology

## 2013-09-27 DIAGNOSIS — R1084 Generalized abdominal pain: Secondary | ICD-10-CM

## 2013-09-27 DIAGNOSIS — Z789 Other specified health status: Secondary | ICD-10-CM

## 2013-09-27 LAB — ANA: Anti Nuclear Antibody(ANA): NEGATIVE

## 2013-09-27 LAB — MITOCHONDRIAL ANTIBODIES: Mitochondrial M2 Ab, IgG: 0.6 (ref <0.91)

## 2013-09-27 LAB — HIV-1 RNA, QUALITATIVE, TMA: HIV-1 RNA, Qualitative, TMA: NOT DETECTED

## 2013-09-27 NOTE — Progress Notes (Addendum)
[facility]    Date of Admission:  09/22/2013   Total days of antibiotics 14        Day 3 levofloxacin           ID: [name] is a 67 y.o. female  with history of asthma and renal cell ca-status post partial nephrectomy who presents with complaints of abdominal pain and diarrhea for the past 3 weeks.  associated weight loss with diarrhea. She denies nausea/vomiting.She denies any sick contacts. She was recently admitted from 10/31-11/04 for ecoli (pan sensitive except R amp) pyelonephritis. She was started on ceftriaxone and discharged on levofloxacin for a total of 14 days to end on 11/14. Work up shows HIV EIA + thought to be false positive due to negative HIV viral load. Diarrhea work up is negative by pathogen PCR panel Active Problems:   Diarrhea   Dehydration   Abnormal loss of weight   Generalized abdominal pain    Subjective: Afebrile, less diarrhea  Medications:  . citalopram  10 mg Oral Daily  . enoxaparin (LOVENOX) injection  40 mg Subcutaneous Q24H  . ezetimibe  10 mg Oral Daily  . feeding supplement (ENSURE COMPLETE)  237 mL Oral Q24H  . feeding supplement (ENSURE)  1 Container Oral Q24H  . fluticasone  2 spray Each Nare Daily  . loratadine  10 mg Oral Daily  . mometasone-formoterol  2 puff Inhalation BID  . montelukast  10 mg Oral Daily  . pantoprazole (PROTONIX) IV  40 mg Intravenous Q12H  . simvastatin  40 mg Oral QPM    Objective: Vital signs in last 24 hours: Temp:  [98.5 F (36.9 C)-98.8 F (37.1 C)] 98.8 F (37.1 C) (11/17 0602) Pulse Rate:  [90-114] 90 (11/17 0602) Resp:  [18] 18 (11/17 0602) BP: (105-152)/(66-83) 105/66 mmHg (11/17 0602) SpO2:  [96 %-100 %] 100 % (11/17 0856)  Constitutional: oriented to person, place, and time.appears well-developed and well-nourished. No distress.  HENT:  Mouth/Throat: Oropharynx is clear and moist. No oropharyngeal exudate.  Cardiovascular: Normal rate, regular rhythm and normal  heart sounds. Exam reveals no gallop and no friction rub.  No murmur heard.  Pulmonary/Chest: Effort normal and breath sounds normal. No respiratory distress. no wheezes.  Abdominal: Soft. Bowel sounds are normal. exhibits no distension. There is no tenderness.  Lymphadenopathy:  no cervical adenopathy.  Neurological: alert and oriented to person, place, and time.  Skin: Skin is warm and dry. No rash noted. No erythema.  Psychiatric: normal mood and affect. behavior is normal.   Lab Results  Recent Labs  09/25/13 0506 09/26/13 0519  WBC 13.3* 15.8*  HGB 8.7* 8.7*  HCT 27.3* 26.6*  NA 139 138  K 3.7 4.4  CL 104 102  CO2 26 27  BUN <3* 3*  CREATININE 0.44* 0.42*   Liver Panel  Recent Labs  09/25/13 0506 09/26/13 0519  PROT 6.7 6.8  ALBUMIN 2.2* 2.2*  AST 51* 49*  ALT 18 17  ALKPHOS 297* 290*  BILITOT 0.5 0.4    Microbiology: Cd 4 count 1000, hiv vl < 20 (negative), stool studies crypto and giardia EIA negative. PCR panel negative Western Blot NEGATIVE Studies/Results: No results found.   Assessment/Plan: Chronic diarrhea = would check studies for celiac disease as possible cause of diarrhea. Biopsies from EGD and colonoscopy are pending. Infectious work up is negative. Can give a trial of  immodium PRN to help decrease numerous diarrheal stools.  False Positive  HIV ELISA screen = western blot is negative. Would recommend letting her know that she had a false positive HIV test, for unclear reasons. In the future, if she gets retested for HIV, repeat false positive may occur.    Drue [facility] Cell: [phone] Pager: [phone]  09/27/2013, 1:40 PM

## 2013-09-27 NOTE — Progress Notes (Signed)
[name] 10:30 AM  Subjective: Patient's history was reviewed and discussed with my partner and the patient is doing okay With only a little pain after a bowel movement and no new complaints  Objective: Vital signs stable afebrile no acute distress abdomen is soft nontender  Assessment: Episodic diarrhea weight loss questionable etiology  Plan: Await biopsies from colonoscopy okay to go home from our stand point and followup with my partner as an outpatient to decide any further workup and plans or medical treatment if she has microscopic colitis Azeem Poorman E

## 2013-09-27 NOTE — Progress Notes (Signed)
[facility] PROGRESS NOTE  [name] GEX:[id number] DOB: [birth year] DOA: 09/22/2013 PCP: [name], MD  Assessment/Plan: Diarrhea/dehydration  -Continue IV fluids  -C. difficile PCR neg -Repeat GI pathogen panel normal -Still complains of watery diarrhea -Appreciate GI input - s/p endoscopy biopsy results pending Transaminasemia  -RUQ [place] with no biliary obstruction -Rapid HIV pos, but confirmatory WB NEGATIVE -viral hepatitis serology neg -GGT elevated -UDS unremarkable -Fatty liver on imaging Hyponatremia  -mild  -likely volume depletion  -IV NS  Pyelonephritis  -levofloxacin has since been d/c'd -repeat UA neg for pyuria  -abd exam benign  Asthma-  -Stable without wheezing  -contine home MDIs Recent pos rapid HIV - Confirmatory WB is NEGATIVE - Appreciate ID recommendations  Code Status: Full Family Communication: Pt in room (indicate person spoken with, relationship, and if by phone, the number) Disposition Plan: Pending  Procedures:  Abd [place] 09/22/13  Antibiotics:  Levaquin 09/22/13>>>09/24/13  HPI/Subjective: No acute events noted overnight. Awaiting biopsy results  Objective: Filed Vitals:   09/26/13 1955 09/26/13 2238 09/27/13 0602 09/27/13 0856  BP:  152/83 105/66   Pulse:  100 90   Temp:  98.5 F (36.9 C) 98.8 F (37.1 C)   TempSrc:  Oral Oral   Resp:  18 18   Height:      Weight:      SpO2: 96% 99% 96% 100%    Intake/Output Summary (Last 24 hours) at 09/27/13 1017 Last data filed at 09/27/13 0842  Gross per 24 hour  Intake   1060 ml  Output    302 ml  Net    758 ml   Filed Weights   09/23/13 1300  Weight: 73.1 kg (161 lb 2.5 oz)    Exam:  General:  Awake, in nad  Cardiovascular: regular, s1, s2  Respiratory: normal resp effort, no wheezing  Abdomen: soft, pos bs   Musculoskeletal: perfused, no clubbing   Data Reviewed: Basic Metabolic Panel:  Recent Labs Lab 09/22/13 1230 09/23/13 0534 09/24/13 0930  09/25/13 0506 09/26/13 0519  NA 134* 137 136 139 138  K 4.2 4.5 4.1 3.7 4.4  CL 98 106 102 104 102  CO2 23 23 22 26 27   GLUCOSE 74 88 89 97 96  BUN 5* 4* <3* <3* 3*  CREATININE 0.64 0.63 0.44* 0.44* 0.42*  CALCIUM 9.2 8.2* 8.6 8.3* 8.5   Liver Function Tests:  Recent Labs Lab 09/22/13 1230 09/24/13 0930 09/25/13 0506 09/26/13 0519  AST 99* 67* 51* 49*  ALT 28 20 18 17   ALKPHOS 466* 336* 297* 290*  BILITOT 0.7 0.5 0.5 0.4  PROT 8.2 7.2 6.7 6.8  ALBUMIN 2.7* 2.3* 2.2* 2.2*   No results found for this basename: LIPASE, AMYLASE,  in the last 168 hours No results found for this basename: AMMONIA,  in the last 168 hours CBC:  Recent Labs Lab 09/22/13 1230 09/23/13 0534 09/24/13 0930 09/25/13 0506 09/26/13 0519  WBC 15.4* 13.5* 12.7* 13.3* 15.8*  NEUTROABS 11.3* 8.7* 8.8* 8.9*  --   HGB 14.8 8.6* 9.2* 8.7* 8.7*  HCT 47.5* 25.9* 29.0* 27.3* 26.6*  MCV 98.3 98.1 101.8* 101.5* 101.9*  PLT 435* 642* 662* 612* 619*   Cardiac Enzymes: No results found for this basename: CKTOTAL, CKMB, CKMBINDEX, TROPONINI,  in the last 168 hours BNP (last 3 results) No results found for this basename: PROBNP,  in the last 8760 hours CBG: No results found for this basename: GLUCAP,  in the last 168 hours  Recent  Results (from the past 240 hour(s))  CLOSTRIDIUM DIFFICILE BY PCR     Status: None   Collection Time    09/22/13  4:36 PM      Result Value Range Status   C difficile by pcr NEGATIVE  NEGATIVE Final   Comment: Performed at [facility]     Studies: No results found.  Scheduled Meds: . citalopram  10 mg Oral Daily  . enoxaparin (LOVENOX) injection  40 mg Subcutaneous Q24H  . ezetimibe  10 mg Oral Daily  . feeding supplement (ENSURE COMPLETE)  237 mL Oral Q24H  . feeding supplement (ENSURE)  1 Container Oral Q24H  . fluticasone  2 spray Each Nare Daily  . loratadine  10 mg Oral Daily  . mometasone-formoterol  2 puff Inhalation BID  . montelukast  10 mg Oral  Daily  . pantoprazole (PROTONIX) IV  40 mg Intravenous Q12H  . simvastatin  40 mg Oral QPM   Continuous Infusions: . sodium chloride 100 mL/hr at 09/24/13 1743  . sodium chloride 20 mL/hr (09/26/13 2208)    Active Problems:   Diarrhea   Dehydration   Abnormal loss of weight   Generalized abdominal pain  Time spent:  [name], [name]  [facility] Pager [phone]. If 7PM-7AM, please contact night-coverage at [email or web address], password [facility] 09/27/2013, 10:17 AM  LOS: 5 days

## 2013-09-28 LAB — ANTI-SMOOTH MUSCLE ANTIBODY, IGG: F-Actin IgG: 10 U (ref <20)

## 2013-09-28 NOTE — Progress Notes (Signed)
NUTRITION FOLLOW UP  Intervention:   - Pt eating excellent, nutrition diagnosis resolved and goal met, nutrition signing off. Consult if needed.   Nutrition Dx:   Inadequate oral intake related to poor appetite as evidenced by pt's report of eating 50% less than usual for the past 3 weeks - resolved    Goal:   Pt to meet >/= 90% of their estimated nutrition needs - met   Assessment:   67 y.o. female with history of asthma and renal cell ca, status post partial nephrectomy, who presents with complaints of abdominal pain and diarrhea for the past 3 days. She states that the abdominal pain has been diffuse, severe and associated with multiple episodes of nonbloody diarrhea. She was seen in the ED and urinalysis is consistent with a UTI, CT scan of abdomen and pelvis showed findings consistent with right sided pyelonephritis.  Infectious disease following, recommended checking for Celiac disease as cause of pt's chronic diarrhea and recommended GI consult. GI met with pt 11/14 and ordered colonoscopy and EGD for 11/15. Colonoscopy showed colon polyp, internal hemorrhoids, and sigmoid diverticulosis. EGD showed antral ulcer, tiny hiatal hernia, and duodenal biopsies were taken to check for celiac sprue. Awaiting biopsies. Pt with false positive HIV test for unclear reasons per MD notes.   Met with pt this morning who reports her appetite is better. States she has been eating 99% of her meals and drinking all of her Ensure Completes and eating all of her Ensure puddings. States she had no diarrhea yesterday and only 1 episode today.   Alk phos and AST elevated but trending down. No new weights.   Height: Ht Readings from Last 1 Encounters:  09/23/13 5' 8.11" (1.73 m)    Weight Status:   Wt Readings from Last 1 Encounters:  09/23/13 161 lb 2.5 oz (73.1 kg)    Re-estimated needs:  Kcal: 1750-1950  Protein: 75-85 grams  Fluid: 2-2.2 L/day   Skin: intact   Diet Order:  General   Intake/Output Summary (Last 24 hours) at 09/28/13 0757 Last data filed at 09/28/13 0630  Gross per 24 hour  Intake 1038.67 ml  Output      0 ml  Net 1038.67 ml    Last BM: 11/16    Labs:   Recent Labs Lab 09/24/13 0930 09/25/13 0506 09/26/13 0519  NA 136 139 138  K 4.1 3.7 4.4  CL 102 104 102  CO2 22 26 27   BUN <3* <3* 3*  CREATININE 0.44* 0.44* 0.42*  CALCIUM 8.6 8.3* 8.5  GLUCOSE 89 97 96    CBG (last 3)  No results found for this basename: GLUCAP,  in the last 72 hours  Scheduled Meds: . citalopram  10 mg Oral Daily  . enoxaparin (LOVENOX) injection  40 mg Subcutaneous Q24H  . ezetimibe  10 mg Oral Daily  . feeding supplement (ENSURE COMPLETE)  237 mL Oral Q24H  . feeding supplement (ENSURE)  1 Container Oral Q24H  . fluticasone  2 spray Each Nare Daily  . loratadine  10 mg Oral Daily  . mometasone-formoterol  2 puff Inhalation BID  . montelukast  10 mg Oral Daily  . pantoprazole (PROTONIX) IV  40 mg Intravenous Q12H  . simvastatin  40 mg Oral QPM    Continuous Infusions: . sodium chloride 100 mL/hr at 09/24/13 1743  . sodium chloride 20 mL/hr (09/26/13 2208)     Levon Hedger MS, RD, LDN [phone] Pager [phone] After Hours Pager

## 2013-09-28 NOTE — Progress Notes (Signed)
Ela P Bi 11:27 AM  Subjective: Patient without any new complaint and stool seemed to be slightly firmer  Objective: Vital signs stable afebrile no acute distress abdomen is soft nontender did have a little upper discomfort earlier in the day white count increased slightly other labs stable biopsies pending  Assessment: Multiple medical problem including episodic diarrhea and weight loss  Plan: Await biopsies okay to go home and followup with my partner Dr. Bosie Clos in the office to review biopsy and discuss any other workup and plans or testing [facility] E

## 2013-09-28 NOTE — Progress Notes (Signed)
[facility] PROGRESS NOTE  [name] ZOX:[id number] DOB: [birth year] DOA: 09/22/2013 PCP: [name] ERIC, MD  Assessment/Plan: Diarrhea/dehydration  -Continue IV fluids as needed -C. difficile PCR neg -Repeat GI pathogen panel normal -Still complains of watery diarrhea -Appreciate GI input - s/p endoscopy biopsy results pending Transaminasemia  -RUQ [place] with no biliary obstruction -Rapid HIV pos, but confirmatory WB NEGATIVE -viral hepatitis serology neg -GGT elevated -UDS unremarkable -Fatty liver on imaging Hyponatremia  -mild  -likely volume depletion  -IV NS  Pyelonephritis  -levofloxacin has since been d/c'd per ID -repeat UA neg for pyuria  -abd exam benign  Asthma-  -Stable without wheezing  -contine home MDIs Recent pos rapid HIV - Confirmatory WB is NEGATIVE - Appreciate ID recommendations  Code Status: Full Family Communication: Pt in room (indicate person spoken with, relationship, and if by phone, the number) Disposition Plan: Pending  Procedures:  Abd [place] 09/22/13  Colonoscopy and endoscopy on 09/25/13  Antibiotics:  Levaquin 09/22/13>>>09/24/13  HPI/Subjective: No acute events noted overnight. Awaiting biopsy results  Objective: Filed Vitals:   09/27/13 1957 09/27/13 2200 09/28/13 0600 09/28/13 0756  BP:  152/83 107/65   Pulse:  107 93   Temp:  99.4 F (37.4 C) 98 F (36.7 C)   TempSrc:  Oral Oral   Resp:  18 18   Height:      Weight:      SpO2: 99% 96% 97% 98%    Intake/Output Summary (Last 24 hours) at 09/28/13 0938 Last data filed at 09/28/13 0630  Gross per 24 hour  Intake 678.67 ml  Output      0 ml  Net 678.67 ml   Filed Weights   09/23/13 1300  Weight: 73.1 kg (161 lb 2.5 oz)    Exam:  General:  Awake, in nad  Cardiovascular: regular, s1, s2  Respiratory: normal resp effort, no wheezing  Abdomen: soft, pos bs   Musculoskeletal: perfused, no clubbing   Data Reviewed: Basic Metabolic Panel:  Recent  Labs Lab 09/22/13 1230 09/23/13 0534 09/24/13 0930 09/25/13 0506 09/26/13 0519  NA 134* 137 136 139 138  K 4.2 4.5 4.1 3.7 4.4  CL 98 106 102 104 102  CO2 23 23 22 26 27   GLUCOSE 74 88 89 97 96  BUN 5* 4* <3* <3* 3*  CREATININE 0.64 0.63 0.44* 0.44* 0.42*  CALCIUM 9.2 8.2* 8.6 8.3* 8.5   Liver Function Tests:  Recent Labs Lab 09/22/13 1230 09/24/13 0930 09/25/13 0506 09/26/13 0519  AST 99* 67* 51* 49*  ALT 28 20 18 17   ALKPHOS 466* 336* 297* 290*  BILITOT 0.7 0.5 0.5 0.4  PROT 8.2 7.2 6.7 6.8  ALBUMIN 2.7* 2.3* 2.2* 2.2*   No results found for this basename: LIPASE, AMYLASE,  in the last 168 hours No results found for this basename: AMMONIA,  in the last 168 hours CBC:  Recent Labs Lab 09/22/13 1230 09/23/13 0534 09/24/13 0930 09/25/13 0506 09/26/13 0519  WBC 15.4* 13.5* 12.7* 13.3* 15.8*  NEUTROABS 11.3* 8.7* 8.8* 8.9*  --   HGB 14.8 8.6* 9.2* 8.7* 8.7*  HCT 47.5* 25.9* 29.0* 27.3* 26.6*  MCV 98.3 98.1 101.8* 101.5* 101.9*  PLT 435* 642* 662* 612* 619*   Cardiac Enzymes: No results found for this basename: CKTOTAL, CKMB, CKMBINDEX, TROPONINI,  in the last 168 hours BNP (last 3 results) No results found for this basename: PROBNP,  in the last 8760 hours CBG: No results found for this basename: GLUCAP,  in  the last 168 hours  Recent Results (from the past 240 hour(s))  CLOSTRIDIUM DIFFICILE BY PCR     Status: None   Collection Time    09/22/13  4:36 PM      Result Value Range Status   C difficile by pcr NEGATIVE  NEGATIVE Final   Comment: Performed at [facility]     Studies: No results found.  Scheduled Meds: . citalopram  10 mg Oral Daily  . enoxaparin (LOVENOX) injection  40 mg Subcutaneous Q24H  . ezetimibe  10 mg Oral Daily  . feeding supplement (ENSURE COMPLETE)  237 mL Oral Q24H  . feeding supplement (ENSURE)  1 Container Oral Q24H  . fluticasone  2 spray Each Nare Daily  . loratadine  10 mg Oral Daily  . mometasone-formoterol   2 puff Inhalation BID  . montelukast  10 mg Oral Daily  . pantoprazole (PROTONIX) IV  40 mg Intravenous Q12H  . simvastatin  40 mg Oral QPM   Continuous Infusions: . sodium chloride 100 mL/hr at 09/24/13 1743  . sodium chloride 20 mL/hr (09/26/13 2208)    Active Problems:   Diarrhea   Dehydration   Abnormal loss of weight   Generalized abdominal pain  Time spent:  [name] K  [facility] Pager [phone]. If 7PM-7AM, please contact night-coverage at [email or web address], password [facility] 09/28/2013, 9:38 AM  LOS: 6 days

## 2013-09-28 NOTE — Progress Notes (Signed)
Patient's spouse phoned the operator to speak with a hospital administrator but was put through to 5 east.  He verbalized his frustration using foul language with his wife's care in regards to no one knowing what is wrong with her after all the test that she had been put through this and last admission.  Suggested he speak with her doctor, whom I would get to the phone and offered the administrators phone number and he said he was not talking to the doctor or myself and hung up.

## 2013-09-29 DIAGNOSIS — E785 Hyperlipidemia, unspecified: Secondary | ICD-10-CM

## 2013-09-29 LAB — CBC
MCH: 32.3 pg (ref 26.0–34.0)
Platelets: 678 10*3/uL — ABNORMAL HIGH (ref 150–400)
RDW: 21.4 % — ABNORMAL HIGH (ref 11.5–15.5)
WBC: 11.8 10*3/uL — ABNORMAL HIGH (ref 4.0–10.5)

## 2013-09-29 NOTE — Progress Notes (Signed)
Skin in buttocks crease red and torn.  Barrier cream applied

## 2013-09-29 NOTE — Progress Notes (Signed)
[facility] PROGRESS NOTE  [name]:[id number] DOB: 18-Oct-1946 DOA: 09/22/2013 PCP: [name], MD  Assessment/Plan: Diarrhea/dehydration  -Continue IV fluids as needed -C. difficile PCR neg -Repeat GI pathogen panel normal -More solid stools today -Appreciate GI input - s/p endoscopy biopsy results pending I have a strong suspicion, but likely this is from Celexa. Patient states his medication he started approximately 2 months ago and is doing better today. In addition, some of her symptoms may be more anxiety. She complains that she's lost approximately 30 pounds. When asked more detail for this, she tells me that one point she would 160 pounds her heaviest and now she feels like she must be down to 110 pounds. When I looked at her weight recorded in the emergency room, it is 161 pounds patient states that she does not want to stop her Celexa, but now that appears better controlled with Imodium, we'll go with this. In addition, I asked the patient she actually has been using Imodium at home and she says no.  Transaminasemia  -RUQ [place] with no biliary obstruction -Rapid HIV pos, but confirmatory WB NEGATIVE -viral hepatitis serology neg -GGT elevated -UDS unremarkable -Fatty liver on imaging The patient status post cholecystectomy several years ago. We'll hav I do not think that this is playing a role in her current diarrhea. Hyponatremia  -mild  -likely volume depletion  -IV NS  Pyelonephritis  -levofloxacin has since been d/c'd per ID -repeat UA neg for pyuria  -abd exam benign  Asthma-  -Stable without wheezing  -contine home MDIs Recent pos rapid HIV - Confirmatory WB is NEGATIVE - Appreciate ID recommendations  Code Status: Full Family Communication: Will discuss with husband Disposition Plan: Discharge home tomorrow  Procedures:  Abd [place] 09/22/13  Colonoscopy and endoscopy on 09/25/13  Antibiotics:  Levaquin 09/22/13>>>09/24/13  HPI/Subjective: No  acute events noted overnight. Awaiting biopsy results  Objective: Filed Vitals:   09/28/13 2200 09/29/13 0600 09/29/13 0852 09/29/13 1311  BP: 114/70 126/79  134/72  Pulse: 64 83  99  Temp: 98.3 F (36.8 C) 98.3 F (36.8 C)  97.8 F (36.6 C)  TempSrc: Oral Oral  Oral  Resp: 18 18  18   Height:      Weight:      SpO2: 96% 96% 99% 98%    Intake/Output Summary (Last 24 hours) at 09/29/13 1610 Last data filed at 09/29/13 2952  Gross per 24 hour  Intake    494 ml  Output      0 ml  Net    494 ml   Filed Weights   09/23/13 1300  Weight: 73.1 kg (161 lb 2.5 oz)    Exam:  General:  Awake, alert and oriented x3, in good spirits  Cardiovascular: regular, s1, s2  Respiratory: normal resp effort, no wheezing  Abdomen: Soft, nontender, nondistended, positive bowel sounds  Musculoskeletal: No clubbing or cyanosis or edema    Data Reviewed: Basic Metabolic Panel:  Recent Labs Lab 09/23/13 0534 09/24/13 0930 09/25/13 0506 09/26/13 0519  NA 137 136 139 138  K 4.5 4.1 3.7 4.4  CL 106 102 104 102  CO2 23 22 26 27   GLUCOSE 88 89 97 96  BUN 4* <3* <3* 3*  CREATININE 0.63 0.44* 0.44* 0.42*  CALCIUM 8.2* 8.6 8.3* 8.5   Liver Function Tests:  Recent Labs Lab 09/24/13 0930 09/25/13 0506 09/26/13 0519  AST 67* 51* 49*  ALT 20 18 17   ALKPHOS 336* 297* 290*  BILITOT  0.5 0.5 0.4  PROT 7.2 6.7 6.8  ALBUMIN 2.3* 2.2* 2.2*   No results found for this basename: LIPASE, AMYLASE,  in the last 168 hours No results found for this basename: AMMONIA,  in the last 168 hours CBC:  Recent Labs Lab 09/23/13 0534 09/24/13 0930 09/25/13 0506 09/26/13 0519 09/29/13 0917  WBC 13.5* 12.7* 13.3* 15.8* 11.8*  NEUTROABS 8.7* 8.8* 8.9*  --   --   HGB 8.6* 9.2* 8.7* 8.7* 9.7*  HCT 25.9* 29.0* 27.3* 26.6* 30.4*  MCV 98.1 101.8* 101.5* 101.9* 101.3*  PLT 642* 662* 612* 619* 678*   Cardiac Enzymes: No results found for this basename: CKTOTAL, CKMB, CKMBINDEX, TROPONINI,  in the  last 168 hours BNP (last 3 results) No results found for this basename: PROBNP,  in the last 8760 hours CBG: No results found for this basename: GLUCAP,  in the last 168 hours  Recent Results (from the past 240 hour(s))  CLOSTRIDIUM DIFFICILE BY PCR     Status: None   Collection Time    09/22/13  4:36 PM      Result Value Range Status   C difficile by pcr NEGATIVE  NEGATIVE Final   Comment: Performed at [facility]     Studies: No results found.  Scheduled Meds: . citalopram  10 mg Oral Daily  . enoxaparin (LOVENOX) injection  40 mg Subcutaneous Q24H  . ezetimibe  10 mg Oral Daily  . feeding supplement (ENSURE COMPLETE)  237 mL Oral Q24H  . feeding supplement (ENSURE)  1 Container Oral Q24H  . fluticasone  2 spray Each Nare Daily  . loratadine  10 mg Oral Daily  . mometasone-formoterol  2 puff Inhalation BID  . montelukast  10 mg Oral Daily  . pantoprazole (PROTONIX) IV  40 mg Intravenous Q12H  . simvastatin  40 mg Oral QPM   Continuous Infusions: . sodium chloride 100 mL/hr at 09/24/13 1743  . sodium chloride 20 mL/hr (09/26/13 2208)    Active Problems:   Diarrhea   Dehydration   Abnormal loss of weight   Generalized abdominal pain  Time spent: 25 minutes  Hollice Espy  [facility] Pager [phone]. If 7PM-7AM, please contact night-coverage at [email or web address], password [facility] 09/29/2013, 4:10 PM  LOS: 7 days

## 2013-09-30 MED ORDER — DICYCLOMINE HCL 10 MG PO CAPS: 10.0000 mg | ORAL_CAPSULE | Freq: Three times a day (TID) | ORAL | Status: DC

## 2013-09-30 MED ORDER — DIPHENOXYLATE-ATROPINE 2.5-0.025 MG PO TABS: ORAL_TABLET | ORAL | Status: DC

## 2013-09-30 MED ORDER — PANTOPRAZOLE SODIUM 40 MG PO TBEC: 40.0000 mg | DELAYED_RELEASE_TABLET | Freq: Every day | ORAL | Status: DC

## 2013-09-30 NOTE — Progress Notes (Signed)
Patient had episode of vomiting right after eating dinner. Patient states she thinks she is eating to much here. No further nausea or vomiting noted after this incident.

## 2013-09-30 NOTE — Discharge Summary (Addendum)
Physician Discharge Summary  [name]:[id number] DOB: [birth year] DOA: 09/22/2013  PCP: [name], MD  Admit date: 09/22/2013 Discharge date: 09/30/2013  Time spent: 25 minutes  Recommendations for Outpatient Follow-up:  1. Patient will follow up with her PCP in the next 2-4 weeks 2. She'll follow up with [facility] gastroenterology in approximately one month 3. She is being discharged on a trial of Bentyl 10 mg 4 times a day before meals and at bedtime 4. To be discharged on Lomotil one by mouth daily while she is on Celexa plus additional when necessary for additional episodes of diarrhea  Discharge Diagnoses:  Active Problems:   Diarrhea   Dehydration   Generalized abdominal pain   Discharge Condition: Improved, being discharged home  Diet recommendation: Low-sodium, small meals  Filed Weights   09/23/13 1300  Weight: 73.1 kg (161 lb 2.5 oz)    History of present illness:  Patient is a 67 year old African American female past medical history of renal cell carcinoma and recently discharge from the hospitalist service 2 weeks prior for pyelonephritis who presented on 11/12 with 3-4 week history of diarrhea. Patient had been complaining of diarrhea during her last admission. She was discharged on when necessary Lomotil. Patient states that she had a 40 pound weight loss in the past 4 months and that she was having diarrhea episodes up to 3 times a day. In addition, she was noted to have a white count of 15.4 patient was admitted to the hospitalist service  Hospital Course:  Active Problems:   Diarrhea: C. difficile culture was repeated and is from previous hospitalization. Both times were negative. Previous stool cultures from previous hospital admission were negative. Patient had an abdominal CT scan done on previous admission which was unremarkable. Electrolytes were normal. Gastroenterology was consulted and patient underwent endoscopy and colonoscopy. Abdomen benign polyp, I  need to unremarkable and pathology from biopsies were negative. In discussion with the patient, he found that she had not been taking the Lomotil at home. Put on Lomotil here, this seemed to improve her diarrhea significantly. In addition, she was started on Celexa at approximately the same time is when her symptoms started. I asked if the patient wants to discontinue Celexa and she declines any really helped her mood. Plan for discharge is for patient to be continuing her Celexa and control her diarrhea with Lomotil. In addition, in regards to her weight loss, patient states that her heaviest she was 160 pounds. She feels like she is down 110. In reality, weight on admission was 161 pounds. Patient's symptoms may be exaggerated. GI states to follow up with patient in one month. They recommended trial of antispasmodic for possible irritable bowel syndrome.    Dehydration: Patient's renal function was normal on admission. She received gentle IV fluids during her hospitalization.    Generalized abdominal pain: Abdominal cramping felt to be secondary to Celexa. Patient was also observed during her hospitalization she consistently but large amounts of meals in a rapid time. She is advised to slow down and he slowly, small portions more often.  Leukocytosis: Felt to be from residual UTI. Patient had been intermittent with her by mouth Levaquin upon discharge. She received antibiotics for several days until course completed antibiotics discontinued. White count was normal blood discharge.  Procedures:  11/15 the colon status post endoscopy noting some signs of quiet gastritis, status post colonoscopy noting 1 benign polyp.  Consultations:  [facility] gastroenterology  Discharge Exam: Filed Vitals:   09/30/13  0618  BP: 121/72  Pulse: 93  Temp: 98.3 F (36.8 C)  Resp: 20    General: Alert and oriented x3, no acute distress Cardiovascular: Regular rate and rhythm, S1-S2 Respiratory: Clear to  auscultation bilaterally Abdomen: Soft, nontender, minimal distention, positive bowel sounds  Discharge Instructions  Discharge Orders   Future Orders Complete By Expires   Diet - low sodium heart healthy  As directed    Increase activity slowly  As directed        Medication List    STOP taking these medications       levofloxacin 750 MG tablet  Commonly known as:  LEVAQUIN      TAKE these medications       albuterol 108 (90 BASE) MCG/ACT inhaler  Commonly known as:  PROVENTIL HFA;VENTOLIN HFA  Inhale 2 puffs into the lungs every 6 (six) hours as needed. Wheezing and shortness of breath     calcium-vitamin D 500-200 MG-UNIT per tablet  Commonly known as:  OSCAL WITH D  Take 1 tablet by mouth 2 (two) times daily.     celecoxib 200 MG capsule  Commonly known as:  CELEBREX  Take 200 mg by mouth 2 (two) times daily.     citalopram 10 MG tablet  Commonly known as:  CELEXA  Take 1 tablet (10 mg total) by mouth daily.     dicyclomine 10 MG capsule  Commonly known as:  BENTYL  Take 1 capsule (10 mg total) by mouth 4 (four) times daily -  before meals and at bedtime.     diphenoxylate-atropine 2.5-0.025 MG per tablet  Commonly known as:  LOMOTIL  Take 1 tablet daily, take additional doses as needed for diarrhea.  If no bowel movement x 2 days, hold dose.     ezetimibe 10 MG tablet  Commonly known as:  ZETIA  Take 1 tablet (10 mg total) by mouth daily.     famotidine 20 MG tablet  Commonly known as:  PEPCID  Take 20 mg by mouth daily.     fluticasone 27.5 MCG/SPRAY nasal spray  Commonly known as:  VERAMYST  Place 2 sprays into the nose 2 (two) times daily.     Fluticasone-Salmeterol 500-50 MCG/DOSE Aepb  Commonly known as:  ADVAIR  Inhale 1 puff into the lungs every 12 (twelve) hours.     HYDROcodone-acetaminophen 5-325 MG per tablet  Commonly known as:  NORCO  Take 1 tablet by mouth every 6 (six) hours as needed for moderate pain.     levocetirizine 5 MG  tablet  Commonly known as:  XYZAL  Take 5 mg by mouth daily.     methocarbamol 500 MG tablet  Commonly known as:  ROBAXIN  Take 500 mg by mouth 2 (two) times daily as needed (for muscle spasms).     montelukast 10 MG tablet  Commonly known as:  SINGULAIR  Take 1 tablet (10 mg total) by mouth daily.     simvastatin 40 MG tablet  Commonly known as:  ZOCOR  Take 1 tablet (40 mg total) by mouth every evening.     triamcinolone 55 MCG/ACT nasal inhaler  Commonly known as:  NASACORT  Place 2 sprays into the nose 2 (two) times daily.     zolpidem 5 MG tablet  Commonly known as:  AMBIEN  Take 1 tablet (5 mg total) by mouth at bedtime as needed for sleep.       Allergies  Allergen Reactions  . Azithromycin Itching  and Swelling    REACTION: unspecified       Follow-up Information   Follow up with August Saucer, ERIC, MD In 1 month.   Specialty:  Internal Medicine   Contact information:   [facility] [address] [place] [address] [phone]       Follow up with Shirley Friar., MD In 1 month.   Specialty:  Gastroenterology   Contact information:   [address] [place] [address] [phone]        The results of significant diagnostics from this hospitalization (including imaging, microbiology, ancillary and laboratory) are listed below for reference.    Significant Diagnostic Studies: Ct Abdomen Pelvis W Contrast  09/10/2013     IMPRESSION: 1. Suspect right-sided pyelonephritis.  2. Status post left partial nephrectomy.  3. Fatty infiltration of the liver.   Electronically Signed   By: Signa Kell M.D.   On: 09/10/2013 15:58   Dg Chest Port 1 View  09/11/2013     IMPRESSION: PICC line in satisfactory position as described. No acute abnormality is noted.   Electronically Signed   By: Alcide Clever M.D.   On: 09/11/2013 15:06   US Abdomen Limited Ruq  09/22/2013   IMPRESSION: Hepatomegaly and hepatic steatosis. No  evidence of biliary obstruction.   Electronically Signed   By: Irish Lack M.D.   On: 09/22/2013 19:51    Microbiology: Recent Results (from the past 240 hour(s))  CLOSTRIDIUM DIFFICILE BY PCR     Status: None   Collection Time    09/22/13  4:36 PM      Result Value Range Status   C difficile by pcr NEGATIVE  NEGATIVE Final   Comment: Performed at [facility]     Labs: Basic Metabolic Panel:  Recent Labs Lab 09/24/13 0930 09/25/13 0506 09/26/13 0519  NA 136 139 138  K 4.1 3.7 4.4  CL 102 104 102  CO2 22 26 27   GLUCOSE 89 97 96  BUN <3* <3* 3*  CREATININE 0.44* 0.44* 0.42*  CALCIUM 8.6 8.3* 8.5   Liver Function Tests:  Recent Labs Lab 09/24/13 0930 09/25/13 0506 09/26/13 0519  AST 67* 51* 49*  ALT 20 18 17   ALKPHOS 336* 297* 290*  BILITOT 0.5 0.5 0.4  PROT 7.2 6.7 6.8  ALBUMIN 2.3* 2.2* 2.2*   CBC:  Recent Labs Lab 09/24/13 0930 09/25/13 0506 09/26/13 0519 09/29/13 0917  WBC 12.7* 13.3* 15.8* 11.8*  NEUTROABS 8.8* 8.9*  --   --   HGB 9.2* 8.7* 8.7* 9.7*  HCT 29.0* 27.3* 26.6* 30.4*  MCV 101.8* 101.5* 101.9* 101.3*  PLT 662* 612* 619* 678*     Signed:  Elira Colasanti K  [facility] 09/30/2013, 10:58 AM

## 2013-09-30 NOTE — Progress Notes (Signed)
[name] 9:43 AM  Subjective: Patient without any new complaints and no signs of diarrhea  Objective: Vital signs stable afebrile no acute distress abdomen is soft nontender biopsies reviewed and okay and discussed with patient and husband  Assessment: Questionable IBS versus adhesional pain  Plan: Okay with me to go home trial of an antispasmodic at home like Bentyl 10 mg one to 2 -30 minutes a.c. and followup with my partner Dr. Bosie Clos in a few week to recheck symptoms and make sure no other workup is needed or other medicine trials are needed please let me know if I could help any further with this hospital stay  [facility]

## 2013-10-06 LAB — HIV-1 GENOTYPR PLUS

## 2013-10-08 ENCOUNTER — Encounter (HOSPITAL_COMMUNITY): Payer: Self-pay | Admitting: Emergency Medicine

## 2013-10-08 ENCOUNTER — Emergency Department (HOSPITAL_COMMUNITY)
Admission: EM | Admit: 2013-10-08 | Discharge: 2013-10-08 | Disposition: A | Discharge status: Home / Self Care | Payer: Medicare Other | Attending: Emergency Medicine | Admitting: Emergency Medicine

## 2013-10-08 DIAGNOSIS — M129 Arthropathy, unspecified: Secondary | ICD-10-CM | POA: Insufficient documentation

## 2013-10-08 DIAGNOSIS — Z85528 Personal history of other malignant neoplasm of kidney: Secondary | ICD-10-CM | POA: Insufficient documentation

## 2013-10-08 DIAGNOSIS — J45909 Unspecified asthma, uncomplicated: Secondary | ICD-10-CM | POA: Insufficient documentation

## 2013-10-08 DIAGNOSIS — L299 Pruritus, unspecified: Secondary | ICD-10-CM | POA: Insufficient documentation

## 2013-10-08 DIAGNOSIS — T4995XA Adverse effect of unspecified topical agent, initial encounter: Secondary | ICD-10-CM | POA: Insufficient documentation

## 2013-10-08 DIAGNOSIS — R000 Tachycardia, unspecified: Secondary | ICD-10-CM | POA: Insufficient documentation

## 2013-10-08 DIAGNOSIS — IMO0002 Reserved for concepts with insufficient information to code with codable children: Secondary | ICD-10-CM | POA: Insufficient documentation

## 2013-10-08 DIAGNOSIS — Z79899 Other long term (current) drug therapy: Secondary | ICD-10-CM | POA: Insufficient documentation

## 2013-10-08 DIAGNOSIS — Z87891 Personal history of nicotine dependence: Secondary | ICD-10-CM | POA: Insufficient documentation

## 2013-10-08 HISTORY — DX: Other seasonal allergic rhinitis: J30.2

## 2013-10-08 MED ORDER — DIPHENHYDRAMINE HCL 25 MG PO TABS: 50.0000 mg | ORAL_TABLET | ORAL | Status: DC | PRN

## 2013-10-08 MED ORDER — FAMOTIDINE 20 MG PO TABS: 20.0000 mg | ORAL_TABLET | Freq: Two times a day (BID) | ORAL | Status: DC

## 2013-10-08 MED ORDER — DIPHENHYDRAMINE HCL 50 MG/ML IJ SOLN
50.0000 mg | Freq: Once | INTRAMUSCULAR | Status: AC
  Administered 2013-10-08: 50 mg via INTRAMUSCULAR
  Filled 2013-10-08: qty 1

## 2013-10-08 MED ORDER — PREDNISONE 20 MG PO TABS: ORAL_TABLET | ORAL | Status: DC

## 2013-10-08 NOTE — ED Notes (Signed)
Patient reports itching since yesterday at noon. Patient does not remember using or eating anything new. Patient states she last took Benadryl last night at 2000. Patient denies any breathing or swallowing difficulties.

## 2013-10-08 NOTE — ED Provider Notes (Signed)
CSN: [id number]     Arrival date & time 10/08/13  4098 History   First MD Initiated Contact with Patient 10/08/13 [phone]     Chief Complaint  Patient presents with  . Allergic Reaction  . Pruritis   (Consider location/radiation/quality/duration/timing/severity/associated sxs/prior Treatment) HPI 67 year old female has 2 days of generalized itching with dry skin without rash without fever without lightheadedness without vomiting without chest pain shortness of breath abdominal pain or other concerns. Treatment prior to arrival consisted of Benadryl last night. Her symptoms are moderately severe. Her symptoms are constant. Past Medical History  Diagnosis Date  . Asthma   . Renal cell carcinoma 2012    left  . Arthritis   . Seasonal allergies    Past Surgical History  Procedure Laterality Date  . Kidney surgery  12/2010    [facility]; partial nephrectomy  . Hernia repair  07/2011    supraumbilical repair  . Bunionectomy  04/2011  . Tubal ligation    . Abdominal hysterectomy    . Menisectomy      left knee  . Cholecystectomy  11/13/2011    Procedure: LAPAROSCOPIC CHOLECYSTECTOMY WITH INTRAOPERATIVE CHOLANGIOGRAM;  Surgeon: Rulon Abide, DO;  Location: [facility];  Service: General;  Laterality: N/A;  . Esophagogastroduodenoscopy N/A 09/25/2013    Procedure: ESOPHAGOGASTRODUODENOSCOPY (EGD);  Surgeon: Shirley Friar, MD;  Location: [facility];  Service: Endoscopy;  Laterality: N/A;  . Colonoscopy N/A 09/25/2013    Procedure: COLONOSCOPY;  Surgeon: Shirley Friar, MD;  Location: [facility] END[facility];  Service: Endoscopy;  Laterality: N/A;   History reviewed. No pertinent family history. History  Substance Use Topics  . Smoking status: Former Smoker -- 0.25 packs/day    Types: Cigarettes    Quit date: 06/25/2013  . Smokeless tobacco: Never Used  . Alcohol Use: No     Comment: socially   OB History   Grav Para Term Preterm Abortions TAB SAB Ect Mult Living                  Review of Systems 10 Systems reviewed and are negative for acute change except as noted in the HPI. Allergies  Azithromycin  Home Medications   Current Outpatient Rx  Name  Route  Sig  Dispense  Refill  . albuterol (PROVENTIL HFA;VENTOLIN HFA) 108 (90 BASE) MCG/ACT inhaler   Inhalation   Inhale 2 puffs into the lungs every 6 (six) hours as needed. Wheezing and shortness of breath         . calcium-vitamin D (OSCAL WITH D) 500-200 MG-UNIT per tablet   Oral   Take 1 tablet by mouth 2 (two) times daily.         . celecoxib (CELEBREX) 200 MG capsule   Oral   Take 200 mg by mouth 2 (two) times daily.         . citalopram (CELEXA) 10 MG tablet   Oral   Take 1 tablet (10 mg total) by mouth daily.   30 tablet   0   . dicyclomine (BENTYL) 10 MG capsule   Oral   Take 1 capsule (10 mg total) by mouth 4 (four) times daily -  before meals and at bedtime.   120 capsule   0   . diphenhydrAMINE (BENADRYL) 25 MG tablet   Oral   Take 25 mg by mouth every 6 (six) hours as needed.         . ezetimibe (ZETIA) 10 MG tablet   Oral   Take  1 tablet (10 mg total) by mouth daily.   30 tablet   0   . fluticasone (VERAMYST) 27.5 MCG/SPRAY nasal spray   Nasal   Place 2 sprays into the nose 2 (two) times daily.          . Fluticasone-Salmeterol (ADVAIR) 500-50 MCG/DOSE AEPB   Inhalation   Inhale 1 puff into the lungs every 12 (twelve) hours.   60 each   2   . HYDROcodone-acetaminophen (NORCO) 5-325 MG per tablet   Oral   Take 1 tablet by mouth every 6 (six) hours as needed for moderate pain.   60 tablet   0   . montelukast (SINGULAIR) 10 MG tablet   Oral   Take 1 tablet (10 mg total) by mouth daily.   30 tablet   0   . simvastatin (ZOCOR) 40 MG tablet   Oral   Take 1 tablet (40 mg total) by mouth every evening.   30 tablet   0   . triamcinolone (NASACORT) 55 MCG/ACT nasal inhaler   Nasal   Place 2 sprays into the nose 2 (two) times daily.          Marland Kitchen  zolpidem (AMBIEN) 5 MG tablet   Oral   Take 1 tablet (5 mg total) by mouth at bedtime as needed for sleep.   30 tablet   0   . diphenhydrAMINE (BENADRYL) 25 MG tablet   Oral   Take 2 tablets (50 mg total) by mouth every 4 (four) hours as needed for itching.   20 tablet   0   . famotidine (PEPCID) 20 MG tablet   Oral   Take 1 tablet (20 mg total) by mouth 2 (two) times daily.   10 tablet   0   . predniSONE (DELTASONE) 20 MG tablet      3 tabs po day one, then 2 tabs daily x 4 days   11 tablet   0    BP 140/81  Pulse 109  Temp(Src) 98.8 F (37.1 C) (Oral)  Resp 20  Ht 5\' 9"  (1.753 m)  Wt 160 lb (72.576 kg)  BMI 23.62 kg/m2  SpO2 99% Physical Exam  Nursing note and vitals reviewed. Constitutional:  Awake, alert, nontoxic appearance.  HENT:  Head: Atraumatic.  Mouth/Throat: Oropharynx is clear and moist.  Tongue and lips normal  Eyes: Right eye exhibits no discharge. Left eye exhibits no discharge.  Neck: Neck supple.  Cardiovascular: Regular rhythm.   No murmur heard. Mildly tachycardic  Pulmonary/Chest: Effort normal and breath sounds normal. No respiratory distress. She has no wheezes. She has no rales. She exhibits no tenderness.  Abdominal: Soft. There is no tenderness. There is no rebound.  Musculoskeletal: She exhibits no tenderness.  Baseline ROM, no obvious new focal weakness.  Neurological: She is alert.  Mental status and motor strength appears baseline for patient and situation.  Skin: Skin is dry. No rash noted.  Psychiatric: She has a normal mood and affect.    ED Course  Procedures (including critical care time) Patient / Family / Caregiver informed of clinical course, understand medical decision-making process, and agree with plan. Labs Review Labs Reviewed - No data to display Imaging Review No results found.  EKG Interpretation   None       MDM   1. Itching    I doubt any other EMC precluding discharge at this time including,  but not necessarily limited to the following:SBI, anaphylaxis.    Margit Banda  Fonnie Jarvis, MD 10/08/13 2014

## 2013-11-30 ENCOUNTER — Encounter (HOSPITAL_COMMUNITY): Payer: Self-pay | Admitting: Emergency Medicine

## 2013-11-30 ENCOUNTER — Emergency Department (HOSPITAL_COMMUNITY)
Admission: EM | Admit: 2013-11-30 | Discharge: 2013-11-30 | Disposition: A | Discharge status: Home / Self Care | Payer: Medicare Other | Attending: Emergency Medicine | Admitting: Emergency Medicine

## 2013-11-30 DIAGNOSIS — Z79899 Other long term (current) drug therapy: Secondary | ICD-10-CM | POA: Insufficient documentation

## 2013-11-30 DIAGNOSIS — J45909 Unspecified asthma, uncomplicated: Secondary | ICD-10-CM | POA: Insufficient documentation

## 2013-11-30 DIAGNOSIS — IMO0002 Reserved for concepts with insufficient information to code with codable children: Secondary | ICD-10-CM | POA: Insufficient documentation

## 2013-11-30 DIAGNOSIS — Z9089 Acquired absence of other organs: Secondary | ICD-10-CM | POA: Insufficient documentation

## 2013-11-30 DIAGNOSIS — Z9851 Tubal ligation status: Secondary | ICD-10-CM | POA: Insufficient documentation

## 2013-11-30 DIAGNOSIS — Z791 Long term (current) use of non-steroidal anti-inflammatories (NSAID): Secondary | ICD-10-CM | POA: Insufficient documentation

## 2013-11-30 DIAGNOSIS — G8929 Other chronic pain: Secondary | ICD-10-CM | POA: Insufficient documentation

## 2013-11-30 DIAGNOSIS — Z87891 Personal history of nicotine dependence: Secondary | ICD-10-CM | POA: Insufficient documentation

## 2013-11-30 DIAGNOSIS — Z85528 Personal history of other malignant neoplasm of kidney: Secondary | ICD-10-CM | POA: Insufficient documentation

## 2013-11-30 DIAGNOSIS — M129 Arthropathy, unspecified: Secondary | ICD-10-CM | POA: Insufficient documentation

## 2013-11-30 DIAGNOSIS — M545 Low back pain, unspecified: Secondary | ICD-10-CM | POA: Insufficient documentation

## 2013-11-30 DIAGNOSIS — R1013 Epigastric pain: Secondary | ICD-10-CM | POA: Insufficient documentation

## 2013-11-30 DIAGNOSIS — Z90710 Acquired absence of both cervix and uterus: Secondary | ICD-10-CM | POA: Insufficient documentation

## 2013-11-30 DIAGNOSIS — Z9889 Other specified postprocedural states: Secondary | ICD-10-CM | POA: Insufficient documentation

## 2013-11-30 DIAGNOSIS — M549 Dorsalgia, unspecified: Secondary | ICD-10-CM

## 2013-11-30 MED ORDER — HYDROCODONE-ACETAMINOPHEN 5-325 MG PO TABS: 1.0000 | ORAL_TABLET | Freq: Four times a day (QID) | ORAL | Status: DC | PRN

## 2013-11-30 NOTE — Discharge Instructions (Signed)
Read the information below.  Use the prescribed medication as directed.  Please discuss all new medications with your pharmacist.  Do not take additional tylenol while taking the prescribed pain medication to avoid overdose.  You may return to the Emergency Department at any time for worsening condition or any new symptoms that concern you.   If you develop fevers, loss of control of bowel or bladder, weakness or numbness in your legs, or are unable to walk, return to the ER for a recheck.   Chronic Back Pain  When back pain lasts longer than 3 months, it is called chronic back pain.People with chronic back pain often go through certain periods that are more intense (flare-ups).  CAUSES Chronic back pain can be caused by wear and tear (degeneration) on different structures in your back. These structures include:  The bones of your spine (vertebrae) and the joints surrounding your spinal cord and nerve roots (facets).  The strong, fibrous tissues that connect your vertebrae (ligaments). Degeneration of these structures may result in pressure on your nerves. This can lead to constant pain. HOME CARE INSTRUCTIONS  Avoid bending, heavy lifting, prolonged sitting, and activities which make the problem worse.  Take brief periods of rest throughout the day to reduce your pain. Lying down or standing usually is better than sitting while you are resting.  Take over-the-counter or prescription medicines only as directed by your caregiver. SEEK IMMEDIATE MEDICAL CARE IF:   You have weakness or numbness in one of your legs or feet.  You have trouble controlling your bladder or bowels.  You have nausea, vomiting, abdominal pain, shortness of breath, or fainting. Document Released: 12/05/2004 Document Revised: 01/20/2012 Document Reviewed: 10/12/2011 [facility] Patient Information 2014 [place], [place].

## 2013-11-30 NOTE — ED Provider Notes (Signed)
CSN: [id number]     Arrival date & time 11/30/13  1144 History  This chart was scribed for non-physician practitioner Trixie Dredge, PA-C working with Toy Baker, MD by Leone Payor, ED Scribe. This patient was seen in room WTR6/WTR6 and the patient's care was started at 1144.      Chief Complaint  Patient presents with  . Back Pain    The history is provided by the patient. No language interpreter was used.    HPI Comments: [name] is a 68 y.o. female with past medical history of arthritis who presents to the Emergency Department complaining of 3 years of chronic, constant back pain that worsened last night with radiation to BLE. Pt states this pain is similar to her prior back/arthritis pain but states she has run out of her pain medication. She is unsure what she takes but states she has not taken any for the past 3 days. She states having an appointment with her PCP on 12/23/13.    She denies chest pain, SOB, leg swelling, fever, numbness, weakness, change in bowel or bladder function, saddle anesthesia.     Past Medical History  Diagnosis Date  . Asthma   . Renal cell carcinoma 2012    left  . Arthritis   . Seasonal allergies    Past Surgical History  Procedure Laterality Date  . Kidney surgery  12/2010    [facility]; partial nephrectomy  . Hernia repair  07/2011    supraumbilical repair  . Bunionectomy  04/2011  . Tubal ligation    . Abdominal hysterectomy    . Menisectomy      left knee  . Cholecystectomy  11/13/2011    Procedure: LAPAROSCOPIC CHOLECYSTECTOMY WITH INTRAOPERATIVE CHOLANGIOGRAM;  Surgeon: Rulon Abide, DO;  Location: [facility];  Service: General;  Laterality: N/A;  . Esophagogastroduodenoscopy N/A 09/25/2013    Procedure: ESOPHAGOGASTRODUODENOSCOPY (EGD);  Surgeon: Shirley Friar, MD;  Location: [facility];  Service: Endoscopy;  Laterality: N/A;  . Colonoscopy N/A 09/25/2013    Procedure: COLONOSCOPY;  Surgeon: Shirley Friar, MD;  Location: [facility]  ENDOSCOPY;  Service: Endoscopy;  Laterality: N/A;   No family history on file. History  Substance Use Topics  . Smoking status: Former Smoker -- 0.25 packs/day    Types: Cigarettes    Quit date: 06/25/2013  . Smokeless tobacco: Never Used  . Alcohol Use: No     Comment: socially   OB History   Grav Para Term Preterm Abortions TAB SAB Ect Mult Living                 Review of Systems  Constitutional: Negative for fever and chills.  Respiratory: Negative for shortness of breath.   Cardiovascular: Negative for chest pain and leg swelling.  Gastrointestinal: Positive for abdominal pain (sharp epigastric pain, lasting seconds, random, x several days.  No pain currently).  Genitourinary: Negative for dysuria and difficulty urinating.  Musculoskeletal: Positive for back pain.  Neurological: Negative for weakness and numbness.    Allergies  Azithromycin  Home Medications   Current Outpatient Rx  Name  Route  Sig  Dispense  Refill  . albuterol (PROVENTIL HFA;VENTOLIN HFA) 108 (90 BASE) MCG/ACT inhaler   Inhalation   Inhale 2 puffs into the lungs every 6 (six) hours as needed. Wheezing and shortness of breath         . calcium-vitamin D (OSCAL WITH D) 500-200 MG-UNIT per tablet   Oral   Take 1 tablet  by mouth 2 (two) times daily.         . celecoxib (CELEBREX) 200 MG capsule   Oral   Take 200 mg by mouth 2 (two) times daily.         . citalopram (CELEXA) 10 MG tablet   Oral   Take 1 tablet (10 mg total) by mouth daily.   30 tablet   0   . dicyclomine (BENTYL) 10 MG capsule   Oral   Take 1 capsule (10 mg total) by mouth 4 (four) times daily -  before meals and at bedtime.   120 capsule   0   . diphenhydrAMINE (BENADRYL) 25 MG tablet   Oral   Take 25 mg by mouth every 6 (six) hours as needed for allergies.          Marland Kitchen ezetimibe (ZETIA) 10 MG tablet   Oral   Take 1 tablet (10 mg total) by mouth daily.   30 tablet   0   . famotidine (PEPCID) 20 MG tablet    Oral   Take 1 tablet (20 mg total) by mouth 2 (two) times daily.   10 tablet   0   . fluticasone (VERAMYST) 27.5 MCG/SPRAY nasal spray   Nasal   Place 2 sprays into the nose 2 (two) times daily.          . Fluticasone-Salmeterol (ADVAIR) 500-50 MCG/DOSE AEPB   Inhalation   Inhale 1 puff into the lungs every 12 (twelve) hours.   60 each   2   . HYDROcodone-acetaminophen (NORCO) 5-325 MG per tablet   Oral   Take 1 tablet by mouth every 6 (six) hours as needed for moderate pain.   60 tablet   0   . montelukast (SINGULAIR) 10 MG tablet   Oral   Take 1 tablet (10 mg total) by mouth daily.   30 tablet   0   . simvastatin (ZOCOR) 40 MG tablet   Oral   Take 1 tablet (40 mg total) by mouth every evening.   30 tablet   0   . triamcinolone (NASACORT) 55 MCG/ACT nasal inhaler   Nasal   Place 2 sprays into the nose 2 (two) times daily.          Marland Kitchen zolpidem (AMBIEN) 5 MG tablet   Oral   Take 1 tablet (5 mg total) by mouth at bedtime as needed for sleep.   30 tablet   0    BP 137/78  Pulse 102  Temp(Src) 99 F (37.2 C) (Oral)  Resp 18  SpO2 99% Physical Exam  Nursing note and vitals reviewed. Constitutional: She is oriented to person, place, and time. She appears well-developed and well-nourished. No distress.  HENT:  Head: Normocephalic and atraumatic.  Neck: Neck supple.  Cardiovascular: Normal rate, regular rhythm and normal heart sounds.   Pulmonary/Chest: Effort normal and breath sounds normal. No respiratory distress. She has no wheezes. She has no rales.  Abdominal: Soft. She exhibits no distension. There is no tenderness. There is no rebound and no guarding.  Musculoskeletal:       Back:  Tenderness to palpation to the para-lumbar region. Spine nontender, no crepitus, or stepoffs.    Neurological: She is alert and oriented to person, place, and time.  Lower extremities:  Strength 5/5, sensation intact, distal pulses intact.    Skin: She is not  diaphoretic.    ED Course  Procedures (including critical care time)  DIAGNOSTIC STUDIES: Oxygen Saturation  is 99% on RA, normal by my interpretation.    COORDINATION OF CARE: 12:41 PM Discussed treatment plan with pt at bedside and pt agreed to plan.   Labs Review Labs Reviewed - No data to display Imaging Review No results found.  EKG Interpretation   None      12:54 PM Dr Zenia Resides made aware of the patient.  MDM   1. Chronic back pain    Patient with chronic low back pain with chronic bilateral radiculopathy.  No change in chronic symptoms.  Last filled norco in early December from Dr Marlou Sa.  Patient records reviewed on [place].  Neurovascularly intact.  No red flags.  D/C home with #10 vicodin, PCP follow up.  Discussed findings, treatment, and follow up  with patient.  Pt given return precautions.  Pt verbalizes understanding and agrees with plan.     I doubt any other EMC precluding discharge at this time including, but not necessarily limited to the following: cauda equina, acute cord compression or significant nerve root compression, ruptured disk, AAA, fracture, tumor, or infection.    I personally performed the services described in this documentation, which was scribed in my presence. The recorded information has been reviewed and is accurate.   Clayton Bibles, PA-C 11/30/13 1441

## 2013-11-30 NOTE — ED Notes (Signed)
Pt from home c/o arthritis in her back. Pt states that pain has moved from her back and now radiates to her  L Leg. Pt states that she wanted to go to her PCP, but he is out of town. Pt is A&O and in NAD. Pt denies urinary s/sx

## 2013-12-03 NOTE — ED Provider Notes (Signed)
Medical screening examination/treatment/procedure(s) were performed by non-physician practitioner and as supervising physician I was immediately available for consultation/collaboration.  EKG Interpretation   None        Leota Jacobsen, MD 12/03/13 [phone]

## 2014-02-28 ENCOUNTER — Emergency Department (HOSPITAL_COMMUNITY): Payer: Medicare Other

## 2014-02-28 ENCOUNTER — Encounter (HOSPITAL_COMMUNITY): Payer: Self-pay | Admitting: Emergency Medicine

## 2014-02-28 ENCOUNTER — Emergency Department (HOSPITAL_COMMUNITY)
Admission: EM | Admit: 2014-02-28 | Discharge: 2014-03-01 | Disposition: A | Discharge status: Home / Self Care | Payer: Medicare Other | Attending: Emergency Medicine | Admitting: Emergency Medicine

## 2014-02-28 DIAGNOSIS — R1011 Right upper quadrant pain: Secondary | ICD-10-CM | POA: Insufficient documentation

## 2014-02-28 DIAGNOSIS — M25551 Pain in right hip: Secondary | ICD-10-CM

## 2014-02-28 DIAGNOSIS — Z79899 Other long term (current) drug therapy: Secondary | ICD-10-CM | POA: Insufficient documentation

## 2014-02-28 DIAGNOSIS — M129 Arthropathy, unspecified: Secondary | ICD-10-CM | POA: Insufficient documentation

## 2014-02-28 DIAGNOSIS — M25559 Pain in unspecified hip: Secondary | ICD-10-CM | POA: Insufficient documentation

## 2014-02-28 DIAGNOSIS — IMO0002 Reserved for concepts with insufficient information to code with codable children: Secondary | ICD-10-CM | POA: Insufficient documentation

## 2014-02-28 DIAGNOSIS — J45909 Unspecified asthma, uncomplicated: Secondary | ICD-10-CM | POA: Insufficient documentation

## 2014-02-28 DIAGNOSIS — R079 Chest pain, unspecified: Secondary | ICD-10-CM | POA: Insufficient documentation

## 2014-02-28 DIAGNOSIS — Z87891 Personal history of nicotine dependence: Secondary | ICD-10-CM | POA: Insufficient documentation

## 2014-02-28 DIAGNOSIS — Z85528 Personal history of other malignant neoplasm of kidney: Secondary | ICD-10-CM | POA: Insufficient documentation

## 2014-02-28 DIAGNOSIS — R112 Nausea with vomiting, unspecified: Secondary | ICD-10-CM | POA: Insufficient documentation

## 2014-02-28 LAB — COMPREHENSIVE METABOLIC PANEL
ALT: 80 U/L — ABNORMAL HIGH (ref 0–35)
AST: 263 U/L — ABNORMAL HIGH (ref 0–37)
Albumin: 2.9 g/dL — ABNORMAL LOW (ref 3.5–5.2)
Alkaline Phosphatase: 653 U/L — ABNORMAL HIGH (ref 39–117)
BUN: 22 mg/dL (ref 6–23)
CO2: 21 meq/L (ref 19–32)
CREATININE: 0.97 mg/dL (ref 0.50–1.10)
Calcium: 9.5 mg/dL (ref 8.4–10.5)
Chloride: 89 mEq/L — ABNORMAL LOW (ref 96–112)
GFR calc non Af Amer: 60 mL/min — ABNORMAL LOW (ref >90)
GFR, EST AFRICAN AMERICAN: 70 mL/min — AB (ref >90)
GLUCOSE: 109 mg/dL — AB (ref 70–99)
Potassium: 4.7 mEq/L (ref 3.7–5.3)
Sodium: 130 mEq/L — ABNORMAL LOW (ref 137–147)
TOTAL PROTEIN: 7.7 g/dL (ref 6.0–8.3)
Total Bilirubin: 3.4 mg/dL — ABNORMAL HIGH (ref 0.3–1.2)

## 2014-02-28 LAB — CBC WITH DIFFERENTIAL/PLATELET
BASOS ABS: 0 10*3/uL (ref 0.0–0.1)
Basophils Relative: 0 % (ref 0–1)
EOS ABS: 0.1 10*3/uL (ref 0.0–0.7)
Eosinophils Relative: 1 % (ref 0–5)
HEMATOCRIT: 31.9 % — AB (ref 36.0–46.0)
Hemoglobin: 11.4 g/dL — ABNORMAL LOW (ref 12.0–15.0)
LYMPHS PCT: 18 % (ref 12–46)
Lymphs Abs: 2.2 10*3/uL (ref 0.7–4.0)
MCH: 35.5 pg — ABNORMAL HIGH (ref 26.0–34.0)
MCHC: 35.7 g/dL (ref 30.0–36.0)
MCV: 99.4 fL (ref 78.0–100.0)
MONO ABS: 0.7 10*3/uL (ref 0.1–1.0)
Monocytes Relative: 6 % (ref 3–12)
Neutro Abs: 9.2 10*3/uL — ABNORMAL HIGH (ref 1.7–7.7)
Neutrophils Relative %: 76 % (ref 43–77)
Platelets: 164 10*3/uL (ref 150–400)
RBC: 3.21 MIL/uL — ABNORMAL LOW (ref 3.87–5.11)
RDW: 19.4 % — ABNORMAL HIGH (ref 11.5–15.5)
WBC: 12.2 10*3/uL — AB (ref 4.0–10.5)

## 2014-02-28 LAB — LIPASE, BLOOD: LIPASE: 15 U/L (ref 11–59)

## 2014-02-28 LAB — URINALYSIS, ROUTINE W REFLEX MICROSCOPIC
Glucose, UA: NEGATIVE mg/dL
Hgb urine dipstick: NEGATIVE
Ketones, ur: NEGATIVE mg/dL
NITRITE: NEGATIVE
PH: 6 (ref 5.0–8.0)
Protein, ur: 30 mg/dL — AB
SPECIFIC GRAVITY, URINE: 1.02 (ref 1.005–1.030)
UROBILINOGEN UA: 4 mg/dL — AB (ref 0.0–1.0)

## 2014-02-28 LAB — URINE MICROSCOPIC-ADD ON

## 2014-02-28 LAB — TROPONIN I: Troponin I: <0.3 ng/mL (ref <0.30)

## 2014-02-28 MED ORDER — MORPHINE SULFATE 4 MG/ML IJ SOLN
4.0000 mg | Freq: Once | INTRAMUSCULAR | Status: AC
  Administered 2014-02-28: 4 mg via INTRAVENOUS
  Filled 2014-02-28: qty 1

## 2014-02-28 NOTE — ED Provider Notes (Signed)
Medical screening examination/treatment/procedure(s) were performed by non-physician practitioner and as supervising physician I was immediately available for consultation/collaboration.   EKG Interpretation   Date/Time:  Monday February 28 2014 20:04:23 EDT Ventricular Rate:  96 PR Interval:  135 QRS Duration: 80 QT Interval:  339 QTC Calculation: 428 R Axis:   35 Text Interpretation:  Sinus rhythm Consider right atrial enlargement  Borderline ECG Confirmed by [facility]  MD, MICHEAL ([id number]) on 02/28/2014 8:07:00  PM        Saddie Benders. Dorna Mai, MD 02/28/14 2226

## 2014-02-28 NOTE — ED Notes (Signed)
Initial contact - pt ambulatory with slow steady gait to room, c/o sharp pain to R hip/leg x2 days, 10/10 at this time.  +csm/+pulses.  Skin PWD.  Speaking full/clear sentences.  NAD.

## 2014-02-28 NOTE — ED Provider Notes (Signed)
MSE was initiated and I personally evaluated the patient and placed orders (if any) at  7:17 PM on February 28, 2014.  [name] is a 67 y.o. female with a PMHx includes asthma, sciatica, renal cell carcinoma, and arthritis who presents to the Emergency Department complaining of moderate R hip pain x 2 days. Denies any trauma or recent fall, however, she attributes this pain to chronic sciatica pain. At this time she denies any weakness, fever, or chills. Patient reports pain to her R buttock x 1 day. She describes this pain as burning and states she feels a "ripping pain whenever I use the bathroom". She also reports mild abdominal pain with associated nausea x 1 day and dark discoloration of her urine. She admits to an ongoing dry cough. At this time she denies any drainage, blood from the stool, dysuria, or fever. PCP: Kevan Ny, MD   Alert and oriented. GCS 15. Heart rate and rhythm normal. Radial and DP pulses 2+ bilaterally. Lungs clear to auscultation to upper and lower lobes bilaterally-negative signs of respiratory distress. Bowel sounds normoactive in all 4 quadrants, soft upon palpation. Discomfort upon palpation to the right upper quadrant, positive Murphy's sign. Full range of motion to upper tremors bilaterally without difficulty noted. Full range of motion to lower extremities bilaterally without difficulty noted. Strength intact with patient. Sensation intact. Rash identified to the buttocks-intergluteal fold region with negative findings of abscess. Appears to be raw and tender to the touch.  Bilateral plain film of hip and pelvis negative for acute osseous injury. Patient presents to the ED with abdominal pain, nausea, vomiting, chest tightness is been ongoing for the past couple of days. Patient does not meet fast track criteria. Orders have been placed. Patient to be moved to the main ED for further assessment to be performed.  I personally performed the services described in this  documentation, which was scribed in my presence. The recorded information has been reviewed and is accurate.  The patient appears stable so that the remainder of the MSE may be completed by another provider.  Jamse Mead, PA-C 02/28/14 2211

## 2014-02-28 NOTE — ED Notes (Signed)
Per EMS, pt with right hip and leg pain.  Started 2 days ago.  No trauma or fall.  Vitals:  118/74, 80, 20, cbg 112

## 2014-02-28 NOTE — ED Provider Notes (Signed)
CSN: [id number]     Arrival date & time 02/28/14  76 History   First MD Initiated Contact with Patient 02/28/14 1851     No chief complaint on file.    (Consider location/radiation/quality/duration/timing/severity/associated sxs/prior Treatment) Patient is a 68 y.o. female presenting with hip pain.  Hip Pain This is a new problem. The current episode started 2 days ago. The problem occurs constantly. The problem has been gradually worsening. Associated symptoms include chest pain and abdominal pain. Pertinent negatives include no shortness of breath. Exacerbated by: Ambulation. Nothing relieves the symptoms. She has tried nothing ("I don't have any pain meds at home") for the symptoms.    Past Medical History  Diagnosis Date  . Asthma   . Renal cell carcinoma 2012    left  . Arthritis   . Seasonal allergies    Past Surgical History  Procedure Laterality Date  . Kidney surgery  12/2010    [facility]; partial nephrectomy  . Hernia repair  [id number]    supraumbilical repair  . Bunionectomy  04/2011  . Tubal ligation    . Abdominal hysterectomy    . Menisectomy      left knee  . Cholecystectomy  11/13/2011    Procedure: LAPAROSCOPIC CHOLECYSTECTOMY WITH INTRAOPERATIVE CHOLANGIOGRAM;  Surgeon: Judieth Keens, DO;  Location: [facility];  Service: General;  Laterality: N/A;  . Esophagogastroduodenoscopy N/A 09/25/2013    Procedure: ESOPHAGOGASTRODUODENOSCOPY (EGD);  Surgeon: Lear Ng, MD;  Location: [facility];  Service: Endoscopy;  Laterality: N/A;  . Colonoscopy N/A 09/25/2013    Procedure: COLONOSCOPY;  Surgeon: Lear Ng, MD;  Location: [facility];  Service: Endoscopy;  Laterality: N/A;   History reviewed. No pertinent family history. History  Substance Use Topics  . Smoking status: Former Smoker -- 0.25 packs/day    Types: Cigarettes    Quit date: 06/25/2013  . Smokeless tobacco: Never Used  . Alcohol Use: No     Comment: socially   OB History   Grav  Para Term Preterm Abortions TAB SAB Ect Mult Living                 Review of Systems  Constitutional: Negative for fever.  HENT: Negative for congestion.   Respiratory: Negative for cough and shortness of breath.   Cardiovascular: Positive for chest pain.  Gastrointestinal: Positive for nausea, vomiting and abdominal pain. Negative for diarrhea.  All other systems reviewed and are negative.     Allergies  Azithromycin  Home Medications   Prior to Admission medications   Medication Sig Start Date End Date Taking? Authorizing Provider  albuterol (PROVENTIL HFA;VENTOLIN HFA) 108 (90 BASE) MCG/ACT inhaler Inhale 2 puffs into the lungs every 6 (six) hours as needed. Wheezing and shortness of breath   Yes Historical Provider, MD  calcium-vitamin D (OSCAL WITH D) 500-200 MG-UNIT per tablet Take 1 tablet by mouth 2 (two) times daily.   Yes Historical Provider, MD  celecoxib (CELEBREX) 200 MG capsule Take 200 mg by mouth 2 (two) times daily.   Yes Historical Provider, MD  citalopram (CELEXA) 10 MG tablet Take 1 tablet (10 mg total) by mouth daily. 08/07/13  Yes Illene Labrador, PA-C  dicyclomine (BENTYL) 10 MG capsule Take 1 capsule (10 mg total) by mouth 4 (four) times daily -  before meals and at bedtime. 09/30/13  Yes Annita Brod, MD  diphenhydrAMINE (BENADRYL) 25 MG tablet Take 25 mg by mouth every 6 (six) hours as needed for allergies.  Yes Historical Provider, MD  ezetimibe (ZETIA) 10 MG tablet Take 1 tablet (10 mg total) by mouth daily. 08/07/13  Yes Illene Labrador, PA-C  famotidine (PEPCID) 20 MG tablet Take 1 tablet (20 mg total) by mouth 2 (two) times daily. 10/08/13  Yes Babette Relic, MD  fluticasone (VERAMYST) 27.5 MCG/SPRAY nasal spray Place 2 sprays into the nose 2 (two) times daily.    Yes Historical Provider, MD  Fluticasone-Salmeterol (ADVAIR) 500-50 MCG/DOSE AEPB Inhale 1 puff into the lungs every 12 (twelve) hours. 08/07/13  Yes Illene Labrador, PA-C   HYDROcodone-acetaminophen (NORCO/VICODIN) 5-325 MG per tablet Take 1 tablet by mouth every 6 (six) hours as needed for moderate pain. 11/30/13  Yes Clayton Bibles, PA-C  montelukast (SINGULAIR) 10 MG tablet Take 1 tablet (10 mg total) by mouth daily. 08/07/13  Yes Illene Labrador, PA-C  simvastatin (ZOCOR) 40 MG tablet Take 1 tablet (40 mg total) by mouth every evening. 08/07/13  Yes Illene Labrador, PA-C  triamcinolone (NASACORT) 55 MCG/ACT nasal inhaler Place 2 sprays into the nose 2 (two) times daily.    Yes Historical Provider, MD   BP 124/80  Pulse 96  Temp(Src) 98.8 F (37.1 C) (Oral)  Resp 18  SpO2 98% Physical Exam  Nursing note and vitals reviewed. Constitutional: She is oriented to person, place, and time. She appears well-developed and well-nourished. No distress.  HENT:  Head: Normocephalic and atraumatic.  Mouth/Throat: Oropharynx is clear and moist.  Eyes: Conjunctivae are normal. Pupils are equal, round, and reactive to light. No scleral icterus.  Neck: Neck supple.  Cardiovascular: Normal rate, regular rhythm, normal heart sounds and intact distal pulses.   No murmur heard. Pulmonary/Chest: Effort normal and breath sounds normal. No stridor. No respiratory distress. She has no rales.  Abdominal: Soft. Bowel sounds are normal. She exhibits no distension. There is tenderness (mild) in the right upper quadrant. There is no rigidity, no rebound and no guarding.  Musculoskeletal: Normal range of motion.       Right hip: She exhibits normal range of motion (NV intact distally), normal strength, no tenderness, no bony tenderness, no swelling, no crepitus and no deformity.       Lumbar back: She exhibits no tenderness, no bony tenderness, no swelling, no edema and no deformity.  Neurological: She is alert and oriented to person, place, and time.  Skin: Skin is warm and dry. No rash noted.  Psychiatric: She has a normal mood and affect. Her behavior is normal.    ED Course  Procedures  (including critical care time) Labs Review Labs Reviewed  CBC WITH DIFFERENTIAL - Abnormal; Notable for the following:    WBC 12.2 (*)    RBC 3.21 (*)    Hemoglobin 11.4 (*)    HCT 31.9 (*)    MCH 35.5 (*)    RDW 19.4 (*)    Neutro Abs 9.2 (*)    All other components within normal limits  COMPREHENSIVE METABOLIC PANEL - Abnormal; Notable for the following:    Sodium 130 (*)    Chloride 89 (*)    Glucose, Bld 109 (*)    Albumin 2.9 (*)    AST 263 (*)    ALT 80 (*)    Alkaline Phosphatase 653 (*)    Total Bilirubin 3.4 (*)    GFR calc non Af Amer 60 (*)    GFR calc Af Amer 70 (*)    All other components within normal limits  TROPONIN I  URINALYSIS, ROUTINE W REFLEX MICROSCOPIC    Imaging Review Dg Chest 2 View  02/28/2014   CLINICAL DATA:  Cough  EXAM: CHEST  2 VIEW  COMPARISON:  09/11/2013 and prior chest radiographs  FINDINGS: The cardiomediastinal silhouette is unremarkable.  There is no evidence of focal airspace disease, pulmonary edema, suspicious pulmonary nodule/mass, pleural effusion, or pneumothorax. No acute bony abnormalities are identified.  IMPRESSION: No active cardiopulmonary disease.   Electronically Signed   By: Hassan Rowan M.D.   On: 02/28/2014 19:59   Dg Lumbar Spine Complete  02/28/2014   CLINICAL DATA:  68 year old female with low back pain radiating into right hip.  EXAM: LUMBAR SPINE - COMPLETE 4+ VIEW  COMPARISON:  04/21/2013 and prior radiographs  FINDINGS: Five non rib-bearing lumbar type vertebra are again identified.  There is no evidence of acute fracture or subluxation.  Moderate to severe degenerative disc disease from L3-S1 again noted.  Mild facet arthropathy in the lower lumbar spine is also present.  No focal bony lesions or spondylolysis identified.  IMPRESSION: No evidence of acute abnormality.  Moderate to severe degenerative disc disease from L3-S1.   Electronically Signed   By: Hassan Rowan M.D.   On: 02/28/2014 20:06   Dg Hip Bilateral  W/pelvis  02/28/2014   CLINICAL DATA:  Right-sided pain since Saturday, no known injury  EXAM: BILATERAL HIP WITH PELVIS - 4+ VIEW  COMPARISON:  Prior CT abdomen/pelvis 09/10/2013  FINDINGS: No acute fracture or malalignment. No evidence of avascular necrosis or significant hip joint osteoarthritis. No lytic or blastic osseous lesion. Degenerative disc disease noted at L4-L5 and L5-S1. Numerous vascular phleboliths. Unremarkable visualized bowel gas pattern. No acute soft tissue abnormality.  IMPRESSION: 1. No acute osseous abnormality. 2. Lower lumbar degenerative disc disease.   Electronically Signed   By: Jacqulynn Cadet M.D.   On: 02/28/2014 18:27   US Abdomen Limited  02/28/2014   CLINICAL DATA:  Right upper quadrant pain. Cholecystectomy. Elevated liver function tests.  EXAM: US ABDOMEN LIMITED - RIGHT UPPER QUADRANT  COMPARISON:  US ABDOMEN LIMITED RUQ/ASCITES dated 09/22/2013  FINDINGS: Gallbladder:  Gallbladder is absent.  Common bile duct:  Diameter: 5 mm in caliber.  Liver:  Diffusely increased in echogenicity.  No focal mass.  IMPRESSION: Postcholecystectomy.  Diffusely increased echogenicity throughout the liver is likely due to diffuse hepatic steatosis. Diffuse hepatic parenchymal disease can have a similar appearance.   Electronically Signed   By: Maryclare Bean M.D.   On: 02/28/2014 23:30  plain films viewed independently by me.   EKG Interpretation   Date/Time:  Monday February 28 2014 20:04:23 EDT Ventricular Rate:  96 PR Interval:  135 QRS Duration: 80 QT Interval:  339 QTC Calculation: 428 R Axis:   35 Text Interpretation:  Sinus rhythm Consider right atrial enlargement  Borderline ECG Confirmed by [facility]  MD, Hoover Browns ([id number]) on 02/28/2014 8:07:00  PM      MDM   Final diagnoses:  Right hip pain    68 yo female presenting with right hip pain.  Her description of symptoms sounds consistent with sciatica type pain but her exam shows no tenderness, no pain with range of motion,  no weakness, normal gait. She also endorsed multiple upper nonspecific symptoms including abdominal pain, intermittent vomiting, chest pain or which has been present for several days. I do not think her chest pain is consistent with ACS, PE, or dissection. Her abdominal exam showed minimal right upper quadrant tenderness with no rigidity, rebound,  or guarding. Her LFTs were mildly elevated (? EtOH pattern )  Ultrasound of this area showed findings consistent with steatosis. Remainder of her ED workup was unremarkable. On recheck, she was eating a sandwich and remained well appearing. I think she is stable for discharge for outpatient followup.  Houston Siren III, MD 03/01/14 [phone]

## 2014-03-01 MED ORDER — OXYCODONE-ACETAMINOPHEN 5-325 MG PO TABS: 1.0000 | ORAL_TABLET | ORAL | Status: DC | PRN

## 2014-03-01 NOTE — Discharge Instructions (Signed)
Hip Pain  The hips join the upper legs to the lower pelvis. The bones, cartilage, tendons, and muscles of the hip joint perform a lot of work each day holding your body weight and allowing you to move around.  Hip pain is a common symptom. It can range from a minor ache to severe pain on 1 or both hips. Pain may be felt on the inside of the hip joint near the groin, or the outside near the buttocks and upper thigh. There may be swelling or stiffness as well. It occurs more often when a person walks or performs activity. There are many reasons hip pain can develop.  CAUSES   It is important to work with your caregiver to identify the cause since many conditions can impact the bones, cartilage, muscles, and tendons of the hips. Causes for hip pain include:   Broken (fractured) bones.   Separation of the thighbone from the hip socket (dislocation).   Torn cartilage of the hip joint.   Swelling (inflammation) of a tendon (tendonitis), the sac within the hip joint (bursitis), or a joint.   A weakening in the abdominal wall (hernia), affecting the nerves to the hip.   Arthritis in the hip joint or lining of the hip joint.   Pinched nerves in the back, hip, or upper thigh.   A bulging disc in the spine (herniated disc).   Rarely, bone infection or cancer.  DIAGNOSIS   The location of your hip pain will help your caregiver understand what may be causing the pain. A diagnosis is based on your medical history, your symptoms, results from your physical exam, and results from diagnostic tests. Diagnostic tests may include X-ray exams, a computerized magnetic scan (magnetic resonance imaging, MRI), or bone scan.  TREATMENT   Treatment will depend on the cause of your hip pain. Treatment may include:   Limiting activities and resting until symptoms improve.   Crutches or other walking supports (a cane or brace).   Ice, elevation, and compression.   Physical therapy or home exercises.   Shoe inserts or special  shoes.   Losing weight.   Medications to reduce pain.   Undergoing surgery.  HOME CARE INSTRUCTIONS    Only take over-the-counter or prescription medicines for pain, discomfort, or fever as directed by your caregiver.   Put ice on the injured area:   Put ice in a plastic bag.   Place a towel between your skin and the bag.   Leave the ice on for 15-20 minutes at a time, 03-04 times a day.   Keep your leg raised (elevated) when possible to lessen swelling.   Avoid activities that cause pain.   Follow specific exercises as directed by your caregiver.   Sleep with a pillow between your legs on your most comfortable side.   Record how often you have hip pain, the location of the pain, and what it feels like. This information may be helpful to you and your caregiver.   Ask your caregiver about returning to work or sports and whether you should drive.   Follow up with your caregiver for further exams, therapy, or testing as directed.  SEEK MEDICAL CARE IF:    Your pain or swelling continues or worsens after 1 week.   You are feeling unwell or have chills.   You have increasing difficulty with walking.   You have a loss of sensation or other new symptoms.   You have questions or concerns.  SEEK   IMMEDIATE MEDICAL CARE IF:    You cannot put weight on the affected hip.   You have fallen.   You have a sudden increase in pain and swelling in your hip.   You have a fever.  MAKE SURE YOU:    Understand these instructions.   Will watch your condition.   Will get help right away if you are not doing well or get worse.  Document Released: 04/17/2010 Document Revised: 01/20/2012 Document Reviewed: 04/17/2010  [employer] Patient Information 2014 [employer].

## 2014-03-01 NOTE — ED Notes (Signed)
Pt given barrier cream for bottom. Ambulatory, Alert and oriented upon discharge.

## 2014-03-13 ENCOUNTER — Encounter (HOSPITAL_COMMUNITY): Payer: Self-pay | Admitting: Emergency Medicine

## 2014-03-13 ENCOUNTER — Emergency Department (HOSPITAL_COMMUNITY)
Admission: EM | Admit: 2014-03-13 | Discharge: 2014-03-13 | Disposition: A | Discharge status: Home / Self Care | Payer: Medicare Other | Attending: Emergency Medicine | Admitting: Emergency Medicine

## 2014-03-13 DIAGNOSIS — M5430 Sciatica, unspecified side: Secondary | ICD-10-CM

## 2014-03-13 DIAGNOSIS — F17200 Nicotine dependence, unspecified, uncomplicated: Secondary | ICD-10-CM | POA: Insufficient documentation

## 2014-03-13 DIAGNOSIS — IMO0002 Reserved for concepts with insufficient information to code with codable children: Secondary | ICD-10-CM | POA: Insufficient documentation

## 2014-03-13 DIAGNOSIS — Z79899 Other long term (current) drug therapy: Secondary | ICD-10-CM | POA: Insufficient documentation

## 2014-03-13 DIAGNOSIS — M129 Arthropathy, unspecified: Secondary | ICD-10-CM | POA: Insufficient documentation

## 2014-03-13 DIAGNOSIS — J45909 Unspecified asthma, uncomplicated: Secondary | ICD-10-CM | POA: Insufficient documentation

## 2014-03-13 DIAGNOSIS — Z85528 Personal history of other malignant neoplasm of kidney: Secondary | ICD-10-CM | POA: Insufficient documentation

## 2014-03-13 MED ORDER — KETOROLAC TROMETHAMINE 30 MG/ML IJ SOLN
30.0000 mg | Freq: Once | INTRAMUSCULAR | Status: AC
  Administered 2014-03-13: 30 mg via INTRAMUSCULAR
  Filled 2014-03-13: qty 1

## 2014-03-13 MED ORDER — HYDROCODONE-ACETAMINOPHEN 5-325 MG PO TABS: 1.0000 | ORAL_TABLET | ORAL | Status: DC | PRN

## 2014-03-13 MED ORDER — PREDNISONE 20 MG PO TABS: ORAL_TABLET | ORAL | Status: DC

## 2014-03-13 NOTE — ED Provider Notes (Signed)
CSN: [id number]     Arrival date & time 03/13/14  0700 History   First MD Initiated Contact with Patient 03/13/14 [phone]     Chief Complaint  Patient presents with  . Arthritis     (Consider location/radiation/quality/duration/timing/severity/associated sxs/prior Treatment) HPI Comments: Presents to the ER for persistent pain in the right lower back that radiates down the leg. She reports been ongoing for approximately 3 months. She is seeing Dr. Gorden Harms for this. She was supposed to have an appointment tomorrow with him, but it was moved until 3 days later. She has been out of her pain medicine and is having increased pain. She denies injury. No weakness in the lower extremities. No numbness in the lower extremity. No change in bowel or bladder function.  Patient is a 68 y.o. female presenting with arthritis.  Arthritis    Past Medical History  Diagnosis Date  . Asthma   . Renal cell carcinoma 2012    left  . Arthritis   . Seasonal allergies    Past Surgical History  Procedure Laterality Date  . Kidney surgery  12/2010    [facility]; partial nephrectomy  . Hernia repair  11/3084    supraumbilical repair  . Bunionectomy  04/2011  . Tubal ligation    . Abdominal hysterectomy    . Menisectomy      left knee  . Cholecystectomy  11/13/2011    Procedure: LAPAROSCOPIC CHOLECYSTECTOMY WITH INTRAOPERATIVE CHOLANGIOGRAM;  Surgeon: Judieth Keens, DO;  Location: [facility];  Service: General;  Laterality: N/A;  . Esophagogastroduodenoscopy N/A 09/25/2013    Procedure: ESOPHAGOGASTRODUODENOSCOPY (EGD);  Surgeon: Lear Ng, MD;  Location: [facility];  Service: Endoscopy;  Laterality: N/A;  . Colonoscopy N/A 09/25/2013    Procedure: COLONOSCOPY;  Surgeon: Lear Ng, MD;  Location: [facility];  Service: Endoscopy;  Laterality: N/A;   History reviewed. No pertinent family history. History  Substance Use Topics  . Smoking status: Current Every Day Smoker -- 0.25 packs/day    Types: Cigarettes    Last Attempt to Quit: 06/25/2013  . Smokeless tobacco: Never Used  . Alcohol Use: No     Comment: socially   OB History   Grav Para Term Preterm Abortions TAB SAB Ect Mult Living                 Review of Systems  Musculoskeletal: Positive for arthritis and back pain.  All other systems reviewed and are negative.     Allergies  Azithromycin  Home Medications   Prior to Admission medications   Medication Sig Start Date End Date Taking? Authorizing Provider  albuterol (PROVENTIL HFA;VENTOLIN HFA) 108 (90 BASE) MCG/ACT inhaler Inhale 2 puffs into the lungs every 6 (six) hours as needed. Wheezing and shortness of breath    Historical Provider, MD  calcium-vitamin D (OSCAL WITH D) 500-200 MG-UNIT per tablet Take 1 tablet by mouth 2 (two) times daily.    Historical Provider, MD  celecoxib (CELEBREX) 200 MG capsule Take 200 mg by mouth 2 (two) times daily.    Historical Provider, MD  citalopram (CELEXA) 10 MG tablet Take 1 tablet (10 mg total) by mouth daily. 08/07/13   Illene Labrador, PA-C  dicyclomine (BENTYL) 10 MG capsule Take 1 capsule (10 mg total) by mouth 4 (four) times daily -  before meals and at bedtime. 09/30/13   Annita Brod, MD  diphenhydrAMINE (BENADRYL) 25 MG tablet Take 25 mg by mouth every 6 (six) hours as needed  for allergies.     Historical Provider, MD  ezetimibe (ZETIA) 10 MG tablet Take 1 tablet (10 mg total) by mouth daily. 08/07/13   Illene Labrador, PA-C  famotidine (PEPCID) 20 MG tablet Take 1 tablet (20 mg total) by mouth 2 (two) times daily. 10/08/13   Babette Relic, MD  fluticasone (VERAMYST) 27.5 MCG/SPRAY nasal spray Place 2 sprays into the nose 2 (two) times daily.     Historical Provider, MD  Fluticasone-Salmeterol (ADVAIR) 500-50 MCG/DOSE AEPB Inhale 1 puff into the lungs every 12 (twelve) hours. 08/07/13   Illene Labrador, PA-C  HYDROcodone-acetaminophen (NORCO/VICODIN) 5-325 MG per tablet Take 1 tablet by mouth every 6 (six)  hours as needed for moderate pain. 11/30/13   Clayton Bibles, PA-C  montelukast (SINGULAIR) 10 MG tablet Take 1 tablet (10 mg total) by mouth daily. 08/07/13   Illene Labrador, PA-C  oxyCODONE-acetaminophen (PERCOCET/ROXICET) 5-325 MG per tablet Take 1 tablet by mouth every 4 (four) hours as needed for severe pain. 03/01/14   Houston Siren III, MD  simvastatin (ZOCOR) 40 MG tablet Take 1 tablet (40 mg total) by mouth every evening. 08/07/13   Illene Labrador, PA-C  triamcinolone (NASACORT) 55 MCG/ACT nasal inhaler Place 2 sprays into the nose 2 (two) times daily.     Historical Provider, MD   BP 120/74  Pulse 97  Temp(Src) 99 F (37.2 C) (Oral)  Resp 20  SpO2 98% Physical Exam  Constitutional: She is oriented to person, place, and time. She appears well-developed and well-nourished. No distress.  HENT:  Head: Normocephalic and atraumatic.  Right Ear: Hearing normal.  Left Ear: Hearing normal.  Nose: Nose normal.  Mouth/Throat: Oropharynx is clear and moist and mucous membranes are normal.  Eyes: Conjunctivae and EOM are normal. Pupils are equal, round, and reactive to light.  Neck: Normal range of motion. Neck supple.  Cardiovascular: Regular rhythm, S1 normal and S2 normal.  Exam reveals no gallop and no friction rub.   No murmur heard. Pulmonary/Chest: Effort normal and breath sounds normal. No respiratory distress. She exhibits no tenderness.  Abdominal: Soft. Normal appearance and bowel sounds are normal. There is no hepatosplenomegaly. There is no tenderness. There is no rebound, no guarding, no tenderness at McBurney's point and negative Murphy's sign. No hernia.  Musculoskeletal: Normal range of motion.  Neurological: She is alert and oriented to person, place, and time. She has normal strength and normal reflexes. No cranial nerve deficit or sensory deficit. Coordination normal. GCS eye subscore is 4. GCS verbal subscore is 5. GCS motor subscore is 6.  Skin: Skin is warm, dry and  intact. No rash noted. No cyanosis.  Psychiatric: She has a normal mood and affect. Her speech is normal and behavior is normal. Thought content normal.    ED Course  Procedures (including critical care time) Labs Review Labs Reviewed - No data to display  Imaging Review No results found.   EKG Interpretation None      MDM   Final diagnoses:  None    Patient presents to the ER with musculoskeletal back pain. Examination reveals back tenderness without any associated neurologic findings. Patient's strength, sensation and reflexes were normal. As such, patient did not require any imaging or further studies. Patient was treated with analgesia.    Orpah Greek, MD 03/13/14 [phone]

## 2014-03-13 NOTE — Discharge Instructions (Signed)
Sciatica °Sciatica is pain, weakness, numbness, or tingling along the path of the sciatic nerve. The nerve starts in the lower back and runs down the back of each leg. The nerve controls the muscles in the lower leg and in the back of the knee, while also providing sensation to the back of the thigh, lower leg, and the sole of your foot. Sciatica is a symptom of another medical condition. For instance, nerve damage or certain conditions, such as a herniated disk or bone spur on the spine, pinch or put pressure on the sciatic nerve. This causes the pain, weakness, or other sensations normally associated with sciatica. Generally, sciatica only affects one side of the body. °CAUSES  °· Herniated or slipped disc. °· Degenerative disk disease. °· A pain disorder involving the narrow muscle in the buttocks (piriformis syndrome). °· Pelvic injury or fracture. °· Pregnancy. °· Tumor (rare). °SYMPTOMS  °Symptoms can vary from mild to very severe. The symptoms usually travel from the low back to the buttocks and down the back of the leg. Symptoms can include: °· Mild tingling or dull aches in the lower back, leg, or hip. °· Numbness in the back of the calf or sole of the foot. °· Burning sensations in the lower back, leg, or hip. °· Sharp pains in the lower back, leg, or hip. °· Leg weakness. °· Severe back pain inhibiting movement. °These symptoms may get worse with coughing, sneezing, laughing, or prolonged sitting or standing. Also, being overweight may worsen symptoms. °DIAGNOSIS  °Your caregiver will perform a physical exam to look for common symptoms of sciatica. He or she may ask you to do certain movements or activities that would trigger sciatic nerve pain. Other tests may be performed to find the cause of the sciatica. These may include: °· Blood tests. °· X-rays. °· Imaging tests, such as an MRI or CT scan. °TREATMENT  °Treatment is directed at the cause of the sciatic pain. Sometimes, treatment is not necessary  and the pain and discomfort goes away on its own. If treatment is needed, your caregiver may suggest: °· Over-the-counter medicines to relieve pain. °· Prescription medicines, such as anti-inflammatory medicine, muscle relaxants, or narcotics. °· Applying heat or ice to the painful area. °· Steroid injections to lessen pain, irritation, and inflammation around the nerve. °· Reducing activity during periods of pain. °· Exercising and stretching to strengthen your abdomen and improve flexibility of your spine. Your caregiver may suggest losing weight if the extra weight makes the back pain worse. °· Physical therapy. °· Surgery to eliminate what is pressing or pinching the nerve, such as a bone spur or part of a herniated disk. °HOME CARE INSTRUCTIONS  °· Only take over-the-counter or prescription medicines for pain or discomfort as directed by your caregiver. °· Apply ice to the affected area for 20 minutes, 3 4 times a day for the first 48 72 hours. Then try heat in the same way. °· Exercise, stretch, or perform your usual activities if these do not aggravate your pain. °· Attend physical therapy sessions as directed by your caregiver. °· Keep all follow-up appointments as directed by your caregiver. °· Do not wear high heels or shoes that do not provide proper support. °· Check your mattress to see if it is too soft. A firm mattress may lessen your pain and discomfort. °SEEK IMMEDIATE MEDICAL CARE IF:  °· You lose control of your bowel or bladder (incontinence). °· You have increasing weakness in the lower back,   pelvis, buttocks, or legs. °· You have redness or swelling of your back. °· You have a burning sensation when you urinate. °· You have pain that gets worse when you lie down or awakens you at night. °· Your pain is worse than you have experienced in the past. °· Your pain is lasting longer than 4 weeks. °· You are suddenly losing weight without reason. °MAKE SURE YOU: °· Understand these  instructions. °· Will watch your condition. °· Will get help right away if you are not doing well or get worse. °Document Released: 10/22/2001 Document Revised: 04/28/2012 Document Reviewed: 03/08/2012 °[employer]® Patient Information ©2014 [employer]. ° °

## 2014-03-13 NOTE — ED Notes (Signed)
Pt states she has arthritis and is out of her pain medication  Pt had an appt with her dr on Monday but they changed it to Wednesday

## 2014-03-13 NOTE — Progress Notes (Signed)
CARE MANAGEMENT NOTE 03/13/2014  Patient:  KATIA, HANNEN   Account Number:  [id number]  Date Initiated:  03/13/2014  Documentation initiated by:  [facility]  Subjective/Objective Assessment:   musculoskeletal back pain     Action/Plan:   lives at home husband   Anticipated DC Date:  03/13/2014   Anticipated DC Plan:  [place]  CM consult      Choice offered to / List presented to:             Status of service:  Completed, signed off Medicare Important Message given?   (If response is "NO", the following Medicare IM given date fields will be blank) Date Medicare IM given:   Date Additional Medicare IM given:    Discharge Disposition:  HOME/SELF CARE  Per UR Regulation:    If discussed at Long Length of Stay Meetings, dates discussed:    Comments:  03/13/2014 1830 NCM contacted pt at home. States she will discuss with PCP on Wednesday at her appt about wheelchair. States she has RW at home. Husband at home to assist with her care. States she has completed outpt PT but it did not help with her mobility. She will discuss with her PCP at appt. Jonnie Finner RN CCM Case Mgmt phone [phone]

## 2014-03-13 NOTE — ED Notes (Signed)
Pt requesting a wheelchair for home due to trouble walking.  Made Pollina aware of pt's request. Will consult with case management.

## 2014-04-21 ENCOUNTER — Encounter (HOSPITAL_COMMUNITY): Payer: Self-pay | Admitting: Emergency Medicine

## 2014-04-21 ENCOUNTER — Emergency Department (HOSPITAL_COMMUNITY): Payer: Medicare Other

## 2014-04-21 ENCOUNTER — Emergency Department (HOSPITAL_COMMUNITY)
Admission: EM | Admit: 2014-04-21 | Discharge: 2014-04-21 | Disposition: A | Discharge status: Home / Self Care | Payer: Medicare Other | Attending: Emergency Medicine | Admitting: Emergency Medicine

## 2014-04-21 DIAGNOSIS — M545 Low back pain, unspecified: Secondary | ICD-10-CM

## 2014-04-21 DIAGNOSIS — J45909 Unspecified asthma, uncomplicated: Secondary | ICD-10-CM | POA: Insufficient documentation

## 2014-04-21 DIAGNOSIS — M129 Arthropathy, unspecified: Secondary | ICD-10-CM | POA: Insufficient documentation

## 2014-04-21 DIAGNOSIS — Z85528 Personal history of other malignant neoplasm of kidney: Secondary | ICD-10-CM | POA: Insufficient documentation

## 2014-04-21 DIAGNOSIS — Z79899 Other long term (current) drug therapy: Secondary | ICD-10-CM | POA: Insufficient documentation

## 2014-04-21 DIAGNOSIS — M25569 Pain in unspecified knee: Secondary | ICD-10-CM | POA: Insufficient documentation

## 2014-04-21 DIAGNOSIS — IMO0001 Reserved for inherently not codable concepts without codable children: Secondary | ICD-10-CM | POA: Insufficient documentation

## 2014-04-21 DIAGNOSIS — F17200 Nicotine dependence, unspecified, uncomplicated: Secondary | ICD-10-CM | POA: Insufficient documentation

## 2014-04-21 DIAGNOSIS — IMO0002 Reserved for concepts with insufficient information to code with codable children: Secondary | ICD-10-CM | POA: Insufficient documentation

## 2014-04-21 MED ORDER — FENTANYL CITRATE 0.05 MG/ML IJ SOLN
50.0000 ug | Freq: Once | INTRAMUSCULAR | Status: AC
  Administered 2014-04-21: 50 ug via INTRAVENOUS
  Filled 2014-04-21: qty 2

## 2014-04-21 MED ORDER — HYDROCODONE-ACETAMINOPHEN 5-325 MG PO TABS: 1.0000 | ORAL_TABLET | Freq: Four times a day (QID) | ORAL | Status: DC | PRN

## 2014-04-21 MED ORDER — MORPHINE SULFATE 4 MG/ML IJ SOLN
4.0000 mg | Freq: Once | INTRAMUSCULAR | Status: AC
  Administered 2014-04-21: 4 mg via INTRAVENOUS
  Filled 2014-04-21: qty 1

## 2014-04-21 NOTE — ED Notes (Signed)
Initial contact-pt A&O x4. Ambulatory but guarding right hip. Hx arthritis. Has had increased pain the x2 weeks. No obvious deformity. No hx hip surgeries. Awaiting MD. No other complaints.

## 2014-04-21 NOTE — ED Provider Notes (Signed)
CSN: [id number]     Arrival date & time 04/21/14  0854 History   First MD Initiated Contact with Patient 04/21/14 1115     Chief Complaint  Patient presents with  . Hip Pain  . Leg Pain     (Consider location/radiation/quality/duration/timing/severity/associated sxs/prior Treatment) HPI Comments: Patient presents emergency department with chief complaint of left hip pain. She states the pain starts in her back, and radiates to her left hip, and radiates down her left leg. She denies any new injury. Contrary nursing note, the pain is down left leg. She denies any fevers, chills, bowel or bladder incontinence. She states that the pain has been worsening. She reports increased pain with walking and going up stairs. She states that she tried to see her PCP this morning, was unable to.  The history is provided by the patient. No language interpreter was used.    Past Medical History  Diagnosis Date  . Asthma   . Renal cell carcinoma 2012    left  . Arthritis   . Seasonal allergies    Past Surgical History  Procedure Laterality Date  . Kidney surgery  12/2010    [facility]; partial nephrectomy  . Hernia repair  03/1883    supraumbilical repair  . Bunionectomy  04/2011  . Tubal ligation    . Abdominal hysterectomy    . Menisectomy      left knee  . Cholecystectomy  11/13/2011    Procedure: LAPAROSCOPIC CHOLECYSTECTOMY WITH INTRAOPERATIVE CHOLANGIOGRAM;  Surgeon: Judieth Keens, DO;  Location: [facility];  Service: General;  Laterality: N/A;  . Esophagogastroduodenoscopy N/A 09/25/2013    Procedure: ESOPHAGOGASTRODUODENOSCOPY (EGD);  Surgeon: Lear Ng, MD;  Location: [facility];  Service: Endoscopy;  Laterality: N/A;  . Colonoscopy N/A 09/25/2013    Procedure: COLONOSCOPY;  Surgeon: Lear Ng, MD;  Location: [facility];  Service: Endoscopy;  Laterality: N/A;   No family history on file. History  Substance Use Topics  . Smoking status: Current Every Day Smoker --  0.25 packs/day    Types: Cigarettes    Last Attempt to Quit: 06/25/2013  . Smokeless tobacco: Never Used  . Alcohol Use: No     Comment: socially   OB History   Grav Para Term Preterm Abortions TAB SAB Ect Mult Living                 Review of Systems  Constitutional: Negative for fever and chills.  Respiratory: Negative for shortness of breath.   Cardiovascular: Negative for chest pain.  Gastrointestinal: Negative for nausea, vomiting, diarrhea and constipation.       No bowel incontinence  Genitourinary: Negative for dysuria.       No urinary incontinence  Musculoskeletal: Positive for arthralgias, back pain and myalgias.  Neurological:       No saddle anesthesia      Allergies  Azithromycin  Home Medications   Prior to Admission medications   Medication Sig Start Date End Date Taking? Authorizing Provider  albuterol (PROVENTIL HFA;VENTOLIN HFA) 108 (90 BASE) MCG/ACT inhaler Inhale 2 puffs into the lungs every 6 (six) hours as needed. Wheezing and shortness of breath    Historical Provider, MD  calcium-vitamin D (OSCAL WITH D) 500-200 MG-UNIT per tablet Take 1 tablet by mouth 2 (two) times daily.    Historical Provider, MD  celecoxib (CELEBREX) 200 MG capsule Take 200 mg by mouth 2 (two) times daily.    Historical Provider, MD  citalopram (CELEXA) 10 MG tablet  Take 1 tablet (10 mg total) by mouth daily. 08/07/13   Illene Labrador, PA-C  dicyclomine (BENTYL) 10 MG capsule Take 1 capsule (10 mg total) by mouth 4 (four) times daily -  before meals and at bedtime. 09/30/13   Annita Brod, MD  diphenhydrAMINE (BENADRYL) 25 MG tablet Take 25 mg by mouth every 6 (six) hours as needed for allergies.     Historical Provider, MD  ezetimibe (ZETIA) 10 MG tablet Take 1 tablet (10 mg total) by mouth daily. 08/07/13   Illene Labrador, PA-C  famotidine (PEPCID) 20 MG tablet Take 1 tablet (20 mg total) by mouth 2 (two) times daily. 10/08/13   Babette Relic, MD  fluticasone (VERAMYST)  27.5 MCG/SPRAY nasal spray Place 2 sprays into the nose 2 (two) times daily.     Historical Provider, MD  Fluticasone-Salmeterol (ADVAIR) 500-50 MCG/DOSE AEPB Inhale 1 puff into the lungs every 12 (twelve) hours. 08/07/13   Illene Labrador, PA-C  HYDROcodone-acetaminophen (NORCO/VICODIN) 5-325 MG per tablet Take 1 tablet by mouth every 6 (six) hours as needed for moderate pain. 11/30/13   Clayton Bibles, PA-C  HYDROcodone-acetaminophen (NORCO/VICODIN) 5-325 MG per tablet Take 1 tablet by mouth every 4 (four) hours as needed for moderate pain. 03/13/14   Orpah Greek, MD  montelukast (SINGULAIR) 10 MG tablet Take 1 tablet (10 mg total) by mouth daily. 08/07/13   Illene Labrador, PA-C  oxyCODONE-acetaminophen (PERCOCET/ROXICET) 5-325 MG per tablet Take 1 tablet by mouth every 4 (four) hours as needed for severe pain. 03/01/14   Houston Siren III, MD  predniSONE (DELTASONE) 20 MG tablet 3 tabs po daily x 3 days, then 2 tabs x 3 days, then 1.5 tabs x 3 days, then 1 tab x 3 days, then 0.5 tabs x 3 days 03/13/14   Orpah Greek, MD  simvastatin (ZOCOR) 40 MG tablet Take 1 tablet (40 mg total) by mouth every evening. 08/07/13   Illene Labrador, PA-C  triamcinolone (NASACORT) 55 MCG/ACT nasal inhaler Place 2 sprays into the nose 2 (two) times daily.     Historical Provider, MD   BP 129/78  Pulse 100  Temp(Src) 98.9 F (37.2 C) (Tympanic)  Resp 25  SpO2 100% Physical Exam  Nursing note and vitals reviewed. Constitutional: She is oriented to person, place, and time. She appears well-developed and well-nourished. No distress.  HENT:  Head: Normocephalic and atraumatic.  Eyes: Conjunctivae and EOM are normal. Right eye exhibits no discharge. Left eye exhibits no discharge. No scleral icterus.  Neck: Normal range of motion. Neck supple. No tracheal deviation present.  Cardiovascular: Normal rate, regular rhythm and normal heart sounds.  Exam reveals no gallop and no friction rub.   No murmur  heard. Pulmonary/Chest: Effort normal and breath sounds normal. No respiratory distress. She has no wheezes.  Abdominal: Soft. She exhibits no distension. There is no tenderness.  Musculoskeletal: Normal range of motion.  Lumbar left-sided paraspinal muscles tender to palpation, no bony tenderness, step-offs, or gross abnormality or deformity of spine, patient is able to ambulate, moves all extremities  Bilateral great toe extension intact Bilateral plantar/dorsiflexion intact  Neurological: She is alert and oriented to person, place, and time. She has normal reflexes.  Sensation and strength intact bilaterally Symmetrical reflexes  Skin: Skin is warm. She is not diaphoretic.  Psychiatric: She has a normal mood and affect. Her behavior is normal. Judgment and thought content normal.    ED Course  Procedures (including  critical care time) Labs Review Labs Reviewed - No data to display  Imaging Review Dg Hip Complete Left  04/21/2014   CLINICAL DATA:  Left hip pain without injury  EXAM: LEFT HIP - COMPLETE 2+ VIEW  COMPARISON:  04/21/2013  FINDINGS: The pelvic ring is intact. No acute fracture or dislocation is seen. Previously seen sclerosis surrounding the right sacroiliac joint is stable. No acute soft tissue changes are noted.  IMPRESSION: No change from the prior exam.   Electronically Signed   By: Inez Catalina M.D.   On: 04/21/2014 14:03     EKG Interpretation None      MDM   Final diagnoses:  Low back pain    Patient with left sided lumbar radicular pain.  Patient with back pain.  No neurological deficits and normal neuro exam.  Patient is ambulatory.  No loss of bowel or bladder control.  Doubt cauda equina.  Denies fever,  doubt epidural abscess or other lesion. Recommend back exercises, stretching, RICE, and will treat with a short course of norco.  Encouraged the patient that there could be a need for additional workup and/or imaging such as MRI, if the symptoms do  not resolve. Patient advised that if the back pain does not resolve, or radiates, this could progress to more serious conditions and is encouraged to follow-up with PCP or orthopedics within 2 weeks.     Montine Circle, PA-C 04/21/14 1423

## 2014-04-21 NOTE — ED Notes (Signed)
Pt requested Ambien prescription. Referred to PCP for this medication. Acknowledges understanding not to take tylenol d/t prescription for Vicodin. AVS in hand. Pt ambulatory but taken outside to car in wheelchair.

## 2014-04-21 NOTE — ED Notes (Signed)
Xray notified patient has been given pain medication. Will be here to get patient shortly.

## 2014-04-21 NOTE — ED Notes (Signed)
Xray notified patient has had more pain medication. Will be here to pick patient up shortly.

## 2014-04-21 NOTE — ED Notes (Addendum)
Pt states that she has severe arthritis.  States that she thinks that it is "going down" because now she is having lt hip pain that radiates down both legs that started last week.  Denies injury.

## 2014-04-21 NOTE — ED Provider Notes (Signed)
Medical screening examination/treatment/procedure(s) were performed by non-physician practitioner and as supervising physician I was immediately available for consultation/collaboration.   EKG Interpretation None       Ezequiel Essex, MD 04/21/14 1727

## 2014-04-21 NOTE — Discharge Instructions (Signed)
Sciatica °Sciatica is pain, weakness, numbness, or tingling along the path of the sciatic nerve. The nerve starts in the lower back and runs down the back of each leg. The nerve controls the muscles in the lower leg and in the back of the knee, while also providing sensation to the back of the thigh, lower leg, and the sole of your foot. Sciatica is a symptom of another medical condition. For instance, nerve damage or certain conditions, such as a herniated disk or bone spur on the spine, pinch or put pressure on the sciatic nerve. This causes the pain, weakness, or other sensations normally associated with sciatica. Generally, sciatica only affects one side of the body. °CAUSES  °· Herniated or slipped disc. °· Degenerative disk disease. °· A pain disorder involving the narrow muscle in the buttocks (piriformis syndrome). °· Pelvic injury or fracture. °· Pregnancy. °· Tumor (rare). °SYMPTOMS  °Symptoms can vary from mild to very severe. The symptoms usually travel from the low back to the buttocks and down the back of the leg. Symptoms can include: °· Mild tingling or dull aches in the lower back, leg, or hip. °· Numbness in the back of the calf or sole of the foot. °· Burning sensations in the lower back, leg, or hip. °· Sharp pains in the lower back, leg, or hip. °· Leg weakness. °· Severe back pain inhibiting movement. °These symptoms may get worse with coughing, sneezing, laughing, or prolonged sitting or standing. Also, being overweight may worsen symptoms. °DIAGNOSIS  °Your caregiver will perform a physical exam to look for common symptoms of sciatica. He or she may ask you to do certain movements or activities that would trigger sciatic nerve pain. Other tests may be performed to find the cause of the sciatica. These may include: °· Blood tests. °· X-rays. °· Imaging tests, such as an MRI or CT scan. °TREATMENT  °Treatment is directed at the cause of the sciatic pain. Sometimes, treatment is not necessary  and the pain and discomfort goes away on its own. If treatment is needed, your caregiver may suggest: °· Over-the-counter medicines to relieve pain. °· Prescription medicines, such as anti-inflammatory medicine, muscle relaxants, or narcotics. °· Applying heat or ice to the painful area. °· Steroid injections to lessen pain, irritation, and inflammation around the nerve. °· Reducing activity during periods of pain. °· Exercising and stretching to strengthen your abdomen and improve flexibility of your spine. Your caregiver may suggest losing weight if the extra weight makes the back pain worse. °· Physical therapy. °· Surgery to eliminate what is pressing or pinching the nerve, such as a bone spur or part of a herniated disk. °HOME CARE INSTRUCTIONS  °· Only take over-the-counter or prescription medicines for pain or discomfort as directed by your caregiver. °· Apply ice to the affected area for 20 minutes, 3 4 times a day for the first 48 72 hours. Then try heat in the same way. °· Exercise, stretch, or perform your usual activities if these do not aggravate your pain. °· Attend physical therapy sessions as directed by your caregiver. °· Keep all follow-up appointments as directed by your caregiver. °· Do not wear high heels or shoes that do not provide proper support. °· Check your mattress to see if it is too soft. A firm mattress may lessen your pain and discomfort. °SEEK IMMEDIATE MEDICAL CARE IF:  °· You lose control of your bowel or bladder (incontinence). °· You have increasing weakness in the lower back,   pelvis, buttocks, or legs.  You have redness or swelling of your back.  You have a burning sensation when you urinate.  You have pain that gets worse when you lie down or awakens you at night.  Your pain is worse than you have experienced in the past.  Your pain is lasting longer than 4 weeks.  You are suddenly losing weight without reason. MAKE SURE YOU:  Understand these  instructions.  Will watch your condition.  Will get help right away if you are not doing well or get worse. Document Released: 10/22/2001 Document Revised: 04/28/2012 Document Reviewed: 03/08/2012 [facility] Patient Information 2014 [place]. Back Pain, Adult Low back pain is very common. About 1 in 5 people have back pain.The cause of low back pain is rarely dangerous. The pain often gets better over time.About half of people with a sudden onset of back pain feel better in just 2 weeks. About 8 in 10 people feel better by 6 weeks.  CAUSES Some common causes of back pain include:  Strain of the muscles or ligaments supporting the spine.  Wear and tear (degeneration) of the spinal discs.  Arthritis.  Direct injury to the back. DIAGNOSIS Most of the time, the direct cause of low back pain is not known.However, back pain can be treated effectively even when the exact cause of the pain is unknown.Answering your caregiver's questions about your overall health and symptoms is one of the most accurate ways to make sure the cause of your pain is not dangerous. If your caregiver needs more information, he or she may order lab work or imaging tests (X-rays or MRIs).However, even if imaging tests show changes in your back, this usually does not require surgery. HOME CARE INSTRUCTIONS For many people, back pain returns.Since low back pain is rarely dangerous, it is often a condition that people can learn to [facility] their own.   Remain active. It is stressful on the back to sit or stand in one place. Do not sit, drive, or stand in one place for more than 30 minutes at a time. Take short walks on level surfaces as soon as pain allows.Try to increase the length of time you walk each day.  Do not stay in bed.Resting more than 1 or 2 days can delay your recovery.  Do not avoid exercise or work.Your body is made to move.It is not dangerous to be active, even though your back may hurt.Your  back will likely heal faster if you return to being active before your pain is gone.  Pay attention to your body when you bend and lift. Many people have less discomfortwhen lifting if they bend their knees, keep the load close to their bodies,and avoid twisting. Often, the most comfortable positions are those that put less stress on your recovering back.  Find a comfortable position to sleep. Use a firm mattress and lie on your side with your knees slightly bent. If you lie on your back, put a pillow under your knees.  Only take over-the-counter or prescription medicines as directed by your caregiver. Over-the-counter medicines to reduce pain and inflammation are often the most helpful.Your caregiver may prescribe muscle relaxant drugs.These medicines help dull your pain so you can more quickly return to your normal activities and healthy exercise.  Put ice on the injured area.  Put ice in a plastic bag.  Place a towel between your skin and the bag.  Leave the ice on for 15-20 minutes, 03-04 times a day for  the first 2 to 3 days. After that, ice and heat may be alternated to reduce pain and spasms.  Ask your caregiver about trying back exercises and gentle massage. This may be of some benefit.  Avoid feeling anxious or stressed.Stress increases muscle tension and can worsen back pain.It is important to recognize when you are anxious or stressed and learn ways to manage it.Exercise is a great option. SEEK MEDICAL CARE IF:  You have pain that is not relieved with rest or medicine.  You have pain that does not improve in 1 week.  You have new symptoms.  You are generally not feeling well. SEEK IMMEDIATE MEDICAL CARE IF:   You have pain that radiates from your back into your legs.  You develop new bowel or bladder control problems.  You have unusual weakness or numbness in your arms or legs.  You develop nausea or vomiting.  You develop abdominal pain.  You feel  faint. Document Released: 10/28/2005 Document Revised: 04/28/2012 Document Reviewed: 03/18/2011 [facility] Patient Information 2014 [place], [place].

## 2014-08-01 ENCOUNTER — Emergency Department (HOSPITAL_COMMUNITY): Payer: Medicare HMO

## 2014-08-01 ENCOUNTER — Emergency Department (HOSPITAL_COMMUNITY)
Admission: EM | Admit: 2014-08-01 | Discharge: 2014-08-01 | Disposition: A | Discharge status: Home / Self Care | Payer: Medicare HMO | Attending: Emergency Medicine | Admitting: Emergency Medicine

## 2014-08-01 ENCOUNTER — Encounter (HOSPITAL_COMMUNITY): Payer: Self-pay | Admitting: Emergency Medicine

## 2014-08-01 DIAGNOSIS — F17200 Nicotine dependence, unspecified, uncomplicated: Secondary | ICD-10-CM | POA: Diagnosis not present

## 2014-08-01 DIAGNOSIS — IMO0002 Reserved for concepts with insufficient information to code with codable children: Secondary | ICD-10-CM | POA: Insufficient documentation

## 2014-08-01 DIAGNOSIS — M129 Arthropathy, unspecified: Secondary | ICD-10-CM | POA: Insufficient documentation

## 2014-08-01 DIAGNOSIS — Y9289 Other specified places as the place of occurrence of the external cause: Secondary | ICD-10-CM | POA: Insufficient documentation

## 2014-08-01 DIAGNOSIS — S79929A Unspecified injury of unspecified thigh, initial encounter: Secondary | ICD-10-CM

## 2014-08-01 DIAGNOSIS — J45909 Unspecified asthma, uncomplicated: Secondary | ICD-10-CM | POA: Diagnosis not present

## 2014-08-01 DIAGNOSIS — Z79899 Other long term (current) drug therapy: Secondary | ICD-10-CM | POA: Diagnosis not present

## 2014-08-01 DIAGNOSIS — W010XXA Fall on same level from slipping, tripping and stumbling without subsequent striking against object, initial encounter: Secondary | ICD-10-CM | POA: Insufficient documentation

## 2014-08-01 DIAGNOSIS — S336XXA Sprain of sacroiliac joint, initial encounter: Secondary | ICD-10-CM | POA: Insufficient documentation

## 2014-08-01 DIAGNOSIS — S79919A Unspecified injury of unspecified hip, initial encounter: Secondary | ICD-10-CM | POA: Insufficient documentation

## 2014-08-01 DIAGNOSIS — Y9389 Activity, other specified: Secondary | ICD-10-CM | POA: Diagnosis not present

## 2014-08-01 MED ORDER — HYDROCODONE-ACETAMINOPHEN 5-325 MG PO TABS: 1.0000 | ORAL_TABLET | Freq: Four times a day (QID) | ORAL | Status: DC | PRN

## 2014-08-01 MED ORDER — FENTANYL CITRATE 0.05 MG/ML IJ SOLN
100.0000 ug | Freq: Once | INTRAMUSCULAR | Status: AC
  Administered 2014-08-01: 100 ug via INTRAMUSCULAR
  Filled 2014-08-01: qty 2

## 2014-08-01 NOTE — ED Notes (Signed)
Bed: WA04 Expected date:  Expected time:  Means of arrival:  Comments: 

## 2014-08-01 NOTE — ED Provider Notes (Signed)
CSN: [id number]     Arrival date & time 08/01/14  0138 History   First MD Initiated Contact with Patient 08/01/14 0458     Chief Complaint  Patient presents with  . Hip Injury     (Consider location/radiation/quality/duration/timing/severity/associated sxs/prior Treatment) HPI This is a 68 year old female who fell yesterday morning about 9 AM. She landed on her "butt bone". She is now having pain in her right SI region. The pain is moderate to severe when walking but she is able to ambulate without difficulty. It is also worse with palpation. She denies other injury.   Past Medical History  Diagnosis Date  . Asthma   . Renal cell carcinoma 2012    left  . Arthritis   . Seasonal allergies    Past Surgical History  Procedure Laterality Date  . Kidney surgery  12/2010    [facility]; partial nephrectomy  . Hernia repair  07/7352    supraumbilical repair  . Bunionectomy  04/2011  . Tubal ligation    . Abdominal hysterectomy    . Menisectomy      left knee  . Cholecystectomy  11/13/2011    Procedure: LAPAROSCOPIC CHOLECYSTECTOMY WITH INTRAOPERATIVE CHOLANGIOGRAM;  Surgeon: Judieth Keens, DO;  Location: [facility];  Service: General;  Laterality: N/A;  . Esophagogastroduodenoscopy N/A 09/25/2013    Procedure: ESOPHAGOGASTRODUODENOSCOPY (EGD);  Surgeon: Lear Ng, MD;  Location: [facility];  Service: Endoscopy;  Laterality: N/A;  . Colonoscopy N/A 09/25/2013    Procedure: COLONOSCOPY;  Surgeon: Lear Ng, MD;  Location: [facility];  Service: Endoscopy;  Laterality: N/A;   No family history on file. History  Substance Use Topics  . Smoking status: Current Every Day Smoker -- 0.25 packs/day    Types: Cigarettes    Last Attempt to Quit: 06/25/2013  . Smokeless tobacco: Never Used  . Alcohol Use: No     Comment: socially   OB History   Grav Para Term Preterm Abortions TAB SAB Ect Mult Living                 Review of Systems  All other systems reviewed and  are negative.   Allergies  Azithromycin  Home Medications   Prior to Admission medications   Medication Sig Start Date End Date Taking? Authorizing Provider  calcium-vitamin D (OSCAL WITH D) 500-200 MG-UNIT per tablet Take 2 tablets by mouth 2 (two) times daily.    Yes Historical Provider, MD  celecoxib (CELEBREX) 200 MG capsule Take 200 mg by mouth 2 (two) times daily.   Yes Historical Provider, MD  citalopram (CELEXA) 10 MG tablet Take 1 tablet (10 mg total) by mouth daily. 08/07/13  Yes Illene Labrador, PA-C  dicyclomine (BENTYL) 10 MG capsule Take 1 capsule (10 mg total) by mouth 4 (four) times daily -  before meals and at bedtime. 09/30/13  Yes Annita Brod, MD  diphenhydrAMINE (BENADRYL) 25 MG tablet Take 25 mg by mouth every 6 (six) hours as needed for allergies.    Yes Historical Provider, MD  estrogens, conjugated, (PREMARIN) 1.25 MG tablet Take 1.25 mg by mouth daily.   Yes Historical Provider, MD  ezetimibe (ZETIA) 10 MG tablet Take 1 tablet (10 mg total) by mouth daily. 08/07/13  Yes Illene Labrador, PA-C  famotidine (PEPCID) 20 MG tablet Take 1 tablet (20 mg total) by mouth 2 (two) times daily. 10/08/13  Yes Babette Relic, MD  fluticasone (VERAMYST) 27.5 MCG/SPRAY nasal spray Place 2 sprays into  the nose 2 (two) times daily.    Yes Historical Provider, MD  Fluticasone-Salmeterol (ADVAIR) 500-50 MCG/DOSE AEPB Inhale 1 puff into the lungs every 12 (twelve) hours. 08/07/13  Yes Illene Labrador, PA-C  montelukast (SINGULAIR) 10 MG tablet Take 1 tablet (10 mg total) by mouth daily. 08/07/13  Yes Illene Labrador, PA-C  simvastatin (ZOCOR) 40 MG tablet Take 1 tablet (40 mg total) by mouth every evening. 08/07/13  Yes Illene Labrador, PA-C  triamcinolone (NASACORT) 55 MCG/ACT nasal inhaler Place 2 sprays into the nose 2 (two) times daily.    Yes Historical Provider, MD  albuterol (PROVENTIL HFA;VENTOLIN HFA) 108 (90 BASE) MCG/ACT inhaler Inhale 2 puffs into the lungs every 6 (six) hours as  needed. Wheezing and shortness of breath    Historical Provider, MD  HYDROcodone-acetaminophen (NORCO/VICODIN) 5-325 MG per tablet Take 1 tablet by mouth every 6 (six) hours as needed for moderate pain. 11/30/13   Clayton Bibles, PA-C   BP 136/74  Pulse 87  Temp(Src) 98.3 F (36.8 C) (Oral)  Resp 16  SpO2 98%  Physical Exam General: Well-developed, well-nourished female in no acute distress; appearance consistent with age of record HENT: normocephalic; atraumatic Eyes: pupils equal, round and reactive to light; extraocular muscles intact Neck: supple Heart: regular rate and rhythm Lungs: clear to auscultation bilaterally Abdomen: soft; nondistended Back: Right SI tenderness without step off or crepitus Extremities: No deformity; full range of motion; pulses normal; no right hip tenderness or pain on range of motion of right hip Neurologic: Awake, alert and oriented; motor function intact in all extremities and symmetric; no facial droop Skin: Warm and dry Psychiatric: Normal mood and affect    ED Course  Procedures (including critical care time)  MDM  Nursing notes and vitals signs, including pulse oximetry, reviewed.  Summary of this visit's results, reviewed by myself:  Labs:  No results found for this or any previous visit (from the past 24 hour(s)).  Imaging Studies: Dg Hip Complete Right  08/01/2014   CLINICAL DATA:  Fall with posterior hip pain.  Initial encounter  EXAM: RIGHT HIP - COMPLETE 2+ VIEW  COMPARISON:  None.  FINDINGS: There is no evidence of hip fracture or dislocation. Bony pelvis is intact. Advanced lower lumbar disc narrowing and spondylotic spurring.  IMPRESSION: No acute osseous findings.   Electronically Signed   By: Jorje Guild M.D.   On: 08/01/2014 03:51        Wynetta Fines, MD 08/01/14 [phone]

## 2014-08-01 NOTE — ED Notes (Signed)
Pt reports that she tripped over something and fell tonight; pt c/o rt hip pain; pt able to ambulate but c/o increasing pain when she does; no shortening or rotation to rt leg noted.

## 2014-08-01 NOTE — ED Notes (Signed)
Patient is alert and oriented x3.  She was given DC instructions and follow up visit instructions.  Patient gave verbal understanding. She was DC ambulatory under her own power to home.  V/S stable.  He was not showing any signs of distress on DC 

## 2014-08-03 DIAGNOSIS — S79929A Unspecified injury of unspecified thigh, initial encounter: Secondary | ICD-10-CM | POA: Diagnosis present

## 2014-08-03 DIAGNOSIS — S79919A Unspecified injury of unspecified hip, initial encounter: Secondary | ICD-10-CM | POA: Insufficient documentation

## 2014-08-03 DIAGNOSIS — Z85528 Personal history of other malignant neoplasm of kidney: Secondary | ICD-10-CM | POA: Diagnosis not present

## 2014-08-03 DIAGNOSIS — J45909 Unspecified asthma, uncomplicated: Secondary | ICD-10-CM | POA: Insufficient documentation

## 2014-08-03 DIAGNOSIS — M129 Arthropathy, unspecified: Secondary | ICD-10-CM | POA: Diagnosis not present

## 2014-08-03 DIAGNOSIS — R296 Repeated falls: Secondary | ICD-10-CM | POA: Insufficient documentation

## 2014-08-03 DIAGNOSIS — Y929 Unspecified place or not applicable: Secondary | ICD-10-CM | POA: Diagnosis not present

## 2014-08-03 DIAGNOSIS — F17200 Nicotine dependence, unspecified, uncomplicated: Secondary | ICD-10-CM | POA: Diagnosis not present

## 2014-08-03 DIAGNOSIS — S7000XA Contusion of unspecified hip, initial encounter: Secondary | ICD-10-CM | POA: Diagnosis not present

## 2014-08-03 DIAGNOSIS — Y939 Activity, unspecified: Secondary | ICD-10-CM | POA: Insufficient documentation

## 2014-08-03 DIAGNOSIS — IMO0002 Reserved for concepts with insufficient information to code with codable children: Secondary | ICD-10-CM | POA: Diagnosis not present

## 2014-08-03 DIAGNOSIS — Z79899 Other long term (current) drug therapy: Secondary | ICD-10-CM | POA: Insufficient documentation

## 2014-08-03 NOTE — ED Notes (Signed)
Has urine sample in triage

## 2014-08-04 ENCOUNTER — Emergency Department (HOSPITAL_COMMUNITY)
Admission: EM | Admit: 2014-08-04 | Discharge: 2014-08-04 | Disposition: A | Discharge status: Home / Self Care | Payer: Medicare HMO | Attending: Emergency Medicine | Admitting: Emergency Medicine

## 2014-08-04 ENCOUNTER — Emergency Department (HOSPITAL_COMMUNITY): Payer: Medicare HMO

## 2014-08-04 ENCOUNTER — Encounter (HOSPITAL_COMMUNITY): Payer: Self-pay | Admitting: Emergency Medicine

## 2014-08-04 DIAGNOSIS — S7001XD Contusion of right hip, subsequent encounter: Secondary | ICD-10-CM

## 2014-08-04 MED ORDER — HYDROMORPHONE HCL 1 MG/ML IJ SOLN
0.5000 mg | Freq: Once | INTRAMUSCULAR | Status: AC
  Administered 2014-08-04: 0.5 mg via INTRAMUSCULAR
  Filled 2014-08-04: qty 1

## 2014-08-04 MED ORDER — OXYCODONE-ACETAMINOPHEN 5-325 MG PO TABS
1.0000 | ORAL_TABLET | Freq: Once | ORAL | Status: AC
  Administered 2014-08-04: 1 via ORAL
  Filled 2014-08-04: qty 1

## 2014-08-04 MED ORDER — OXYCODONE-ACETAMINOPHEN 5-325 MG PO TABS: 1.0000 | ORAL_TABLET | Freq: Four times a day (QID) | ORAL | Status: DC | PRN

## 2014-08-04 MED ORDER — HYDROMORPHONE HCL 1 MG/ML IJ SOLN
1.0000 mg | Freq: Once | INTRAMUSCULAR | Status: AC
  Administered 2014-08-04: 1 mg via INTRAMUSCULAR
  Filled 2014-08-04: qty 1

## 2014-08-04 NOTE — ED Notes (Signed)
Pt states she fell on Monday and came here for evaluation  Pt states since the fall she has been having terrible pain in her right hip  Pt states she has been taking pain medication she was prescribed but it is not helping  Pt states it hurts to sit or walk  Pt states she has an appt with her dr on the 3rd but she cannot wait that long

## 2014-08-04 NOTE — ED Provider Notes (Signed)
CSN: [id number]     Arrival date & time 08/03/14  2339 History   First MD Initiated Contact with Patient 08/04/14 0101     Chief Complaint  Patient presents with  . Hip Pain     (Consider location/radiation/quality/duration/timing/severity/associated sxs/prior Treatment) Patient is a 68 y.o. female presenting with hip pain. The history is provided by the patient.  Hip Pain This is a new problem. Episode onset: 3 days ago. The problem occurs constantly. The problem has not changed since onset.Pertinent negatives include no chest pain, no abdominal pain, no headaches and no shortness of breath. The symptoms are aggravated by walking. Relieved by: pain meds  Treatments tried: narcotic pain meds. The treatment provided mild relief.    Past Medical History  Diagnosis Date  . Asthma   . Renal cell carcinoma 2012    left  . Arthritis   . Seasonal allergies    Past Surgical History  Procedure Laterality Date  . Kidney surgery  12/2010    [facility]; partial nephrectomy  . Hernia repair  01/159    supraumbilical repair  . Bunionectomy  04/2011  . Tubal ligation    . Abdominal hysterectomy    . Menisectomy      left knee  . Cholecystectomy  11/13/2011    Procedure: LAPAROSCOPIC CHOLECYSTECTOMY WITH INTRAOPERATIVE CHOLANGIOGRAM;  Surgeon: Judieth Keens, DO;  Location: [facility];  Service: General;  Laterality: N/A;  . Esophagogastroduodenoscopy N/A 09/25/2013    Procedure: ESOPHAGOGASTRODUODENOSCOPY (EGD);  Surgeon: Lear Ng, MD;  Location: [facility];  Service: Endoscopy;  Laterality: N/A;  . Colonoscopy N/A 09/25/2013    Procedure: COLONOSCOPY;  Surgeon: Lear Ng, MD;  Location: [facility];  Service: Endoscopy;  Laterality: N/A;   History reviewed. No pertinent family history. History  Substance Use Topics  . Smoking status: Current Every Day Smoker -- 0.25 packs/day    Types: Cigarettes    Last Attempt to Quit: 06/25/2013  . Smokeless tobacco: Never Used  .  Alcohol Use: No     Comment: socially   OB History   Grav Para Term Preterm Abortions TAB SAB Ect Mult Living                 Review of Systems  Constitutional: Negative for fever and fatigue.  HENT: Negative for congestion and drooling.   Eyes: Negative for pain.  Respiratory: Negative for cough and shortness of breath.   Cardiovascular: Negative for chest pain.  Gastrointestinal: Negative for nausea, vomiting, abdominal pain and diarrhea.  Genitourinary: Negative for dysuria and hematuria.  Musculoskeletal: Negative for back pain, gait problem and neck pain.  Skin: Negative for color change.  Neurological: Negative for dizziness and headaches.  Hematological: Negative for adenopathy.  Psychiatric/Behavioral: Negative for behavioral problems.  All other systems reviewed and are negative.     Allergies  Azithromycin  Home Medications   Prior to Admission medications   Medication Sig Start Date End Date Taking? Authorizing Provider  albuterol (PROVENTIL HFA;VENTOLIN HFA) 108 (90 BASE) MCG/ACT inhaler Inhale 2 puffs into the lungs every 6 (six) hours as needed. Wheezing and shortness of breath   Yes Historical Provider, MD  calcium-vitamin D (OSCAL WITH D) 500-200 MG-UNIT per tablet Take 2 tablets by mouth 2 (two) times daily.    Yes Historical Provider, MD  celecoxib (CELEBREX) 200 MG capsule Take 200 mg by mouth 2 (two) times daily.   Yes Historical Provider, MD  citalopram (CELEXA) 10 MG tablet Take 1 tablet (10  mg total) by mouth daily. 08/07/13  Yes Illene Labrador, PA-C  dicyclomine (BENTYL) 10 MG capsule Take 1 capsule (10 mg total) by mouth 4 (four) times daily -  before meals and at bedtime. 09/30/13  Yes Annita Brod, MD  diphenhydrAMINE (BENADRYL) 25 MG tablet Take 25 mg by mouth every 6 (six) hours as needed for allergies.    Yes Historical Provider, MD  estrogens, conjugated, (PREMARIN) 1.25 MG tablet Take 1.25 mg by mouth daily.   Yes Historical Provider, MD   ezetimibe (ZETIA) 10 MG tablet Take 1 tablet (10 mg total) by mouth daily. 08/07/13  Yes Illene Labrador, PA-C  famotidine (PEPCID) 20 MG tablet Take 1 tablet (20 mg total) by mouth 2 (two) times daily. 10/08/13  Yes Babette Relic, MD  fluticasone (VERAMYST) 27.5 MCG/SPRAY nasal spray Place 2 sprays into the nose 2 (two) times daily.    Yes Historical Provider, MD  Fluticasone-Salmeterol (ADVAIR) 500-50 MCG/DOSE AEPB Inhale 1 puff into the lungs every 12 (twelve) hours. 08/07/13  Yes Illene Labrador, PA-C  HYDROcodone-acetaminophen (NORCO/VICODIN) 5-325 MG per tablet Take 1-2 tablets by mouth every 6 (six) hours as needed for moderate pain.  11/30/13  Yes Clayton Bibles, PA-C  montelukast (SINGULAIR) 10 MG tablet Take 1 tablet (10 mg total) by mouth daily. 08/07/13  Yes Illene Labrador, PA-C  simvastatin (ZOCOR) 40 MG tablet Take 1 tablet (40 mg total) by mouth every evening. 08/07/13  Yes Illene Labrador, PA-C  triamcinolone (NASACORT) 55 MCG/ACT nasal inhaler Place 2 sprays into the nose 2 (two) times daily.    Yes Historical Provider, MD   BP 159/90  Pulse 99  Temp(Src) 98.1 F (36.7 C) (Oral)  Resp 20  SpO2 100% Physical Exam  Nursing note and vitals reviewed. Constitutional: She is oriented to person, place, and time. She appears well-developed and well-nourished.  HENT:  Head: Normocephalic and atraumatic.  Mouth/Throat: Oropharynx is clear and moist. No oropharyngeal exudate.  Eyes: Conjunctivae and EOM are normal. Pupils are equal, round, and reactive to light.  Neck: Normal range of motion. Neck supple.  Cardiovascular: Normal rate, regular rhythm, normal heart sounds and intact distal pulses.  Exam reveals no gallop and no friction rub.   No murmur heard. Pulmonary/Chest: Effort normal and breath sounds normal. No respiratory distress. She has no wheezes.  Abdominal: Soft. Bowel sounds are normal. There is no tenderness. There is no rebound and no guarding.  Musculoskeletal: She exhibits  tenderness. She exhibits no edema.  Normal range of motion of the right hip with mild pain.  Mild tenderness to palpation of the right lateral hip.  2+ pulses in the bilateral distal lower extremities.  Neurological: She is alert and oriented to person, place, and time.  Skin: Skin is warm and dry.  Psychiatric: She has a normal mood and affect. Her behavior is normal.    ED Course  Procedures (including critical care time) Labs Review Labs Reviewed - No data to display  Imaging Review Ct Hip Right Wo Contrast  08/04/2014   CLINICAL DATA:  Hip pain.  Plain films several days ago.  EXAM: CT OF THE RIGHT HIP WITHOUT CONTRAST  TECHNIQUE: Multidetector CT imaging of the right hip was performed according to the standard protocol. Multiplanar CT image reconstructions were also generated.  COMPARISON:  Body CT 12/19/2011  FINDINGS: There is no evidence of hip fracture or dislocation. No evidence of osteonecrosis or erosion. No visible joint effusion.  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 No significant hip degeneration.  Limited intra-abdominal imaging notable for small volume pelvic ascites.  IMPRESSION: No acute osseous findings. If ongoing concern for occult fracture, note that MRI is the most sensitive modality.   Electronically Signed   By: Jorje Guild M.D.   On: 08/04/2014 04:07     EKG Interpretation None      MDM   Final diagnoses:  Contusion of right hip, subsequent encounter    1:28 AM 68 y.o. female presents with ongoing right hip pain after a mechanical fall backwards onto her right hip several days ago. She denies hitting her head or loss of consciousness. She was evaluated here at that time. She had noncontributory plain film imaging and was sent home with pain control. She notes ongoing pain worse with ambulation. Will get pain control and CT.  6:32 AM: CT neg. Pain controlled. Will rec f/u w/ her pcp. Will provide new  Rx for pain meds as she finished the previous Rx.  I have discussed the diagnosis/risks/treatment options with the patient and believe the pt to be eligible for discharge home to follow-up with her pcp. We also discussed returning to the ED immediately if new or worsening sx occur. We discussed the sx which are most concerning (e.g., worsening pain, fever) that necessitate immediate return. Medications administered to the patient during their visit and any new prescriptions provided to the patient are listed below.  Medications given during this visit Medications  HYDROmorphone (DILAUDID) injection 0.5 mg (0.5 mg Intramuscular Given 08/04/14 0132)  oxyCODONE-acetaminophen (PERCOCET/ROXICET) 5-325 MG per tablet 1 tablet (1 tablet Oral Given 08/04/14 0222)  HYDROmorphone (DILAUDID) injection 0.5 mg (0.5 mg Intramuscular Given 08/04/14 0411)    Discharge Medication List as of 08/04/2014  6:33 AM    START taking these medications   Details  oxyCODONE-acetaminophen (PERCOCET) 5-325 MG per tablet Take 1-2 tablets by mouth every 6 (six) hours as needed for moderate pain., Starting 08/04/2014, Until Discontinued, Print         Pamella Pert, MD 08/04/14 [phone]

## 2014-08-04 NOTE — Discharge Instructions (Signed)
Contusion °A contusion is a deep bruise. Contusions happen when an injury causes bleeding under the skin. Signs of bruising include pain, puffiness (swelling), and discolored skin. The contusion may turn blue, purple, or yellow. °HOME CARE  °· Put ice on the injured area. °¨ Put ice in a plastic bag. °¨ Place a towel between your skin and the bag. °¨ Leave the ice on for 15-20 minutes, 03-04 times a day. °· Only take medicine as told by your doctor. °· Rest the injured area. °· If possible, raise (elevate) the injured area to lessen puffiness. °GET HELP RIGHT AWAY IF:  °· You have more bruising or puffiness. °· You have pain that is getting worse. °· Your puffiness or pain is not helped by medicine. °MAKE SURE YOU:  °· Understand these instructions. °· Will watch your condition. °· Will get help right away if you are not doing well or get worse. °Document Released: 04/15/2008 Document Revised: 01/20/2012 Document Reviewed: 09/02/2011 °[employer]® Patient Information ©2015 [employer]. This information is not intended to replace advice given to you by your health care provider. Make sure you discuss any questions you have with your health care provider. ° °

## 2014-08-07 ENCOUNTER — Emergency Department (HOSPITAL_COMMUNITY)
Admission: EM | Admit: 2014-08-07 | Discharge: 2014-08-07 | Disposition: A | Discharge status: Home / Self Care | Payer: Medicare HMO | Attending: Emergency Medicine | Admitting: Emergency Medicine

## 2014-08-07 ENCOUNTER — Encounter (HOSPITAL_COMMUNITY): Payer: Self-pay | Admitting: Emergency Medicine

## 2014-08-07 DIAGNOSIS — R945 Abnormal results of liver function studies: Secondary | ICD-10-CM | POA: Insufficient documentation

## 2014-08-07 DIAGNOSIS — Y9289 Other specified places as the place of occurrence of the external cause: Secondary | ICD-10-CM | POA: Diagnosis not present

## 2014-08-07 DIAGNOSIS — S7011XA Contusion of right thigh, initial encounter: Secondary | ICD-10-CM

## 2014-08-07 DIAGNOSIS — Z85528 Personal history of other malignant neoplasm of kidney: Secondary | ICD-10-CM | POA: Diagnosis not present

## 2014-08-07 DIAGNOSIS — F17200 Nicotine dependence, unspecified, uncomplicated: Secondary | ICD-10-CM | POA: Insufficient documentation

## 2014-08-07 DIAGNOSIS — IMO0002 Reserved for concepts with insufficient information to code with codable children: Secondary | ICD-10-CM | POA: Diagnosis not present

## 2014-08-07 DIAGNOSIS — S7001XA Contusion of right hip, initial encounter: Secondary | ICD-10-CM

## 2014-08-07 DIAGNOSIS — R7989 Other specified abnormal findings of blood chemistry: Secondary | ICD-10-CM

## 2014-08-07 DIAGNOSIS — S7010XA Contusion of unspecified thigh, initial encounter: Secondary | ICD-10-CM | POA: Diagnosis not present

## 2014-08-07 DIAGNOSIS — G8911 Acute pain due to trauma: Secondary | ICD-10-CM | POA: Insufficient documentation

## 2014-08-07 DIAGNOSIS — Z8739 Personal history of other diseases of the musculoskeletal system and connective tissue: Secondary | ICD-10-CM | POA: Diagnosis not present

## 2014-08-07 DIAGNOSIS — Y9389 Activity, other specified: Secondary | ICD-10-CM | POA: Diagnosis not present

## 2014-08-07 DIAGNOSIS — J45909 Unspecified asthma, uncomplicated: Secondary | ICD-10-CM | POA: Diagnosis not present

## 2014-08-07 DIAGNOSIS — Z79899 Other long term (current) drug therapy: Secondary | ICD-10-CM | POA: Insufficient documentation

## 2014-08-07 DIAGNOSIS — M25559 Pain in unspecified hip: Secondary | ICD-10-CM | POA: Diagnosis present

## 2014-08-07 DIAGNOSIS — R296 Repeated falls: Secondary | ICD-10-CM | POA: Diagnosis not present

## 2014-08-07 LAB — COMPREHENSIVE METABOLIC PANEL
ALK PHOS: 356 U/L — AB (ref 39–117)
ALT: 40 U/L — AB (ref 0–35)
AST: 148 U/L — ABNORMAL HIGH (ref 0–37)
Albumin: 3.4 g/dL — ABNORMAL LOW (ref 3.5–5.2)
Anion gap: 16 — ABNORMAL HIGH (ref 5–15)
BUN: 8 mg/dL (ref 6–23)
CHLORIDE: 93 meq/L — AB (ref 96–112)
CO2: 29 meq/L (ref 19–32)
Calcium: 9.3 mg/dL (ref 8.4–10.5)
Creatinine, Ser: 0.43 mg/dL — ABNORMAL LOW (ref 0.50–1.10)
GFR calc non Af Amer: >90 mL/min (ref >90)
GLUCOSE: 93 mg/dL (ref 70–99)
POTASSIUM: 3.2 meq/L — AB (ref 3.7–5.3)
SODIUM: 138 meq/L (ref 137–147)
Total Bilirubin: 1.7 mg/dL — ABNORMAL HIGH (ref 0.3–1.2)
Total Protein: 7.8 g/dL (ref 6.0–8.3)

## 2014-08-07 LAB — CBC WITH DIFFERENTIAL/PLATELET
Basophils Absolute: 0.1 10*3/uL (ref 0.0–0.1)
Basophils Relative: 1 % (ref 0–1)
Eosinophils Absolute: 0.3 10*3/uL (ref 0.0–0.7)
Eosinophils Relative: 4 % (ref 0–5)
HCT: 34.6 % — ABNORMAL LOW (ref 36.0–46.0)
Hemoglobin: 11.7 g/dL — ABNORMAL LOW (ref 12.0–15.0)
LYMPHS ABS: 1.8 10*3/uL (ref 0.7–4.0)
LYMPHS PCT: 23 % (ref 12–46)
MCH: 35 pg — ABNORMAL HIGH (ref 26.0–34.0)
MCHC: 33.8 g/dL (ref 30.0–36.0)
MCV: 103.6 fL — ABNORMAL HIGH (ref 78.0–100.0)
Monocytes Absolute: 0.4 10*3/uL (ref 0.1–1.0)
Monocytes Relative: 5 % (ref 3–12)
NEUTROS ABS: 5.2 10*3/uL (ref 1.7–7.7)
NEUTROS PCT: 67 % (ref 43–77)
PLATELETS: 291 10*3/uL (ref 150–400)
RBC: 3.34 MIL/uL — AB (ref 3.87–5.11)
RDW: 15.9 % — ABNORMAL HIGH (ref 11.5–15.5)
WBC: 7.8 10*3/uL (ref 4.0–10.5)

## 2014-08-07 MED ORDER — SODIUM CHLORIDE 0.9 % IV BOLUS (SEPSIS)
1000.0000 mL | Freq: Once | INTRAVENOUS | Status: AC
  Administered 2014-08-07: 1000 mL via INTRAVENOUS

## 2014-08-07 MED ORDER — HYDROMORPHONE HCL 1 MG/ML IJ SOLN
1.0000 mg | Freq: Once | INTRAMUSCULAR | Status: AC
  Administered 2014-08-07: 1 mg via INTRAVENOUS
  Filled 2014-08-07: qty 1

## 2014-08-07 MED ORDER — CYCLOBENZAPRINE HCL 5 MG PO TABS
5.0000 mg | ORAL_TABLET | Freq: Three times a day (TID) | ORAL | Status: DC | PRN
Start: 2014-08-07 — End: 2014-10-13

## 2014-08-07 MED ORDER — DIAZEPAM 5 MG/ML IJ SOLN
2.5000 mg | Freq: Once | INTRAMUSCULAR | Status: AC
  Administered 2014-08-07: 2.5 mg via INTRAVENOUS
  Filled 2014-08-07: qty 2

## 2014-08-07 MED ORDER — OXYCODONE-ACETAMINOPHEN 5-325 MG PO TABS: 1.0000 | ORAL_TABLET | Freq: Four times a day (QID) | ORAL | Status: DC | PRN

## 2014-08-07 NOTE — ED Notes (Signed)
Pt states that she fell about two weeks ago and has been having right hip pain ever since. Pt states that she is having trouble walking and getting around due to the pain. Pt states that she had xray done here and they told her not fractures just bruised.

## 2014-08-07 NOTE — ED Notes (Addendum)
Pt stated that she would see [place] in two days, for her pain.

## 2014-08-07 NOTE — ED Provider Notes (Signed)
CSN: [id number]     Arrival date & time 08/07/14  1253 History   First MD Initiated Contact with Patient 08/07/14 1314     Chief Complaint  Patient presents with  . Hip Pain     (Consider location/radiation/quality/duration/timing/severity/associated sxs/prior Treatment) The history is provided by the patient.  ETHA STAMBAUGH is a 68 y.o. female hx of asthma, arthritis, here with right hip pain. Fell 2 weeks ago and had right hip pain since then. She is able to ambulate but has pain with ambulation. Denies falls since then. Came 3 days ago and had CT that showed no fracture. Patient was prescribed percocet but accidentally threw it into the bedside commode. She is not on chronic pain meds.    Past Medical History  Diagnosis Date  . Asthma   . Renal cell carcinoma 2012    left  . Arthritis   . Seasonal allergies    Past Surgical History  Procedure Laterality Date  . Kidney surgery  12/2010    [facility]; partial nephrectomy  . Hernia repair  06/997    supraumbilical repair  . Bunionectomy  04/2011  . Tubal ligation    . Abdominal hysterectomy    . Menisectomy      left knee  . Cholecystectomy  11/13/2011    Procedure: LAPAROSCOPIC CHOLECYSTECTOMY WITH INTRAOPERATIVE CHOLANGIOGRAM;  Surgeon: Judieth Keens, DO;  Location: [facility];  Service: General;  Laterality: N/A;  . Esophagogastroduodenoscopy N/A 09/25/2013    Procedure: ESOPHAGOGASTRODUODENOSCOPY (EGD);  Surgeon: Lear Ng, MD;  Location: [facility];  Service: Endoscopy;  Laterality: N/A;  . Colonoscopy N/A 09/25/2013    Procedure: COLONOSCOPY;  Surgeon: Lear Ng, MD;  Location: [facility];  Service: Endoscopy;  Laterality: N/A;   No family history on file. History  Substance Use Topics  . Smoking status: Current Every Day Smoker -- 0.25 packs/day    Types: Cigarettes    Last Attempt to Quit: 06/25/2013  . Smokeless tobacco: Never Used  . Alcohol Use: No     Comment: socially   OB History   Grav  Para Term Preterm Abortions TAB SAB Ect Mult Living                 Review of Systems  Musculoskeletal:       R hip pain   All other systems reviewed and are negative.     Allergies  Azithromycin  Home Medications   Prior to Admission medications   Medication Sig Start Date End Date Taking? Authorizing Provider  albuterol (PROVENTIL HFA;VENTOLIN HFA) 108 (90 BASE) MCG/ACT inhaler Inhale 2 puffs into the lungs every 6 (six) hours as needed for wheezing or shortness of breath.    Yes Historical Provider, MD  calcium-vitamin D (OSCAL WITH D) 500-200 MG-UNIT per tablet Take 2 tablets by mouth 2 (two) times daily.    Yes Historical Provider, MD  citalopram (CELEXA) 10 MG tablet Take 10 mg by mouth every morning.   Yes Historical Provider, MD  dicyclomine (BENTYL) 10 MG capsule Take 10 mg by mouth 4 (four) times daily -  before meals and at bedtime.   Yes Historical Provider, MD  diphenhydrAMINE (BENADRYL) 25 MG tablet Take 25 mg by mouth every 6 (six) hours as needed for allergies.    Yes Historical Provider, MD  ezetimibe (ZETIA) 10 MG tablet Take 10 mg by mouth every morning.   Yes Historical Provider, MD  famotidine (PEPCID) 20 MG tablet Take 20 mg by mouth  2 (two) times daily.   Yes Historical Provider, MD  Fluticasone-Salmeterol (ADVAIR) 500-50 MCG/DOSE AEPB Inhale 1 puff into the lungs 2 (two) times daily.   Yes Historical Provider, MD  montelukast (SINGULAIR) 10 MG tablet Take 10 mg by mouth every morning.   Yes Historical Provider, MD  simvastatin (ZOCOR) 40 MG tablet Take 40 mg by mouth every morning.   Yes Historical Provider, MD   BP 148/95  Pulse 103  Temp(Src) 98.7 F (37.1 C) (Oral)  Resp 16  SpO2 100% Physical Exam  Nursing note and vitals reviewed. Constitutional: She is oriented to person, place, and time.  Uncomfortable, chronically ill   HENT:  Head: Normocephalic.  Mouth/Throat: Oropharynx is clear and moist.  Eyes: Conjunctivae and EOM are normal. Pupils are  equal, round, and reactive to light.  Neck: Normal range of motion. Neck supple. No thyromegaly present.  Cardiovascular: Normal rate, regular rhythm and normal heart sounds.   Pulmonary/Chest: Effort normal and breath sounds normal. No respiratory distress. She has no wheezes. She has no rales.  Abdominal: Soft. Bowel sounds are normal. She exhibits no distension. There is no tenderness.  Musculoskeletal:  Dec ROM R hip from pain. Pelvis stable. Neurovascular intact. Able to bear weight on R leg   Neurological: She is alert and oriented to person, place, and time. No cranial nerve deficit. Coordination normal.  Skin: Skin is warm and dry.  Psychiatric: She has a normal mood and affect. Her behavior is normal. Judgment and thought content normal.    ED Course  Procedures (including critical care time) Labs Review Labs Reviewed  CBC WITH DIFFERENTIAL - Abnormal; Notable for the following:    RBC 3.34 (*)    Hemoglobin 11.7 (*)    HCT 34.6 (*)    MCV 103.6 (*)    MCH 35.0 (*)    RDW 15.9 (*)    All other components within normal limits  COMPREHENSIVE METABOLIC PANEL - Abnormal; Notable for the following:    Potassium 3.2 (*)    Chloride 93 (*)    Creatinine, Ser 0.43 (*)    Albumin 3.4 (*)    AST 148 (*)    ALT 40 (*)    Alkaline Phosphatase 356 (*)    Total Bilirubin 1.7 (*)    Anion gap 16 (*)    All other components within normal limits    Imaging Review No results found.   EKG Interpretation None      MDM   Final diagnoses:  None    TAMALYN WADSWORTH is a 68 y.o. female here with R hip pain. Able to bear weight on the leg and has no falls for 2 weeks. CT 3 days showed no fracture. I doubt occult fracture so will not get MRI. She just needs pain control. Will refill percocet for now.   2:43 PM Pain improved with pain meds. Of note, LFTs elevated, but improved for several months ago. No abdominal pain. Patient states that she has hx of elevated LFTs.     Wandra Arthurs, MD 08/07/14 [phone]

## 2014-08-07 NOTE — Discharge Instructions (Signed)
Take motrin for pain.   Take percocet for severe pain.   Take flexeril for muscle spasms.   Follow up with an orthopedic doctor.   Your liver function is slightly abnormal. You should mention to your doctor and get repeat LFTs.   Return to ER if you have severe pain, unable to walk, another fall.

## 2014-09-12 ENCOUNTER — Other Ambulatory Visit: Payer: Self-pay | Admitting: Orthopedic Surgery

## 2014-09-12 DIAGNOSIS — R29898 Other symptoms and signs involving the musculoskeletal system: Secondary | ICD-10-CM

## 2014-09-12 DIAGNOSIS — Z8739 Personal history of other diseases of the musculoskeletal system and connective tissue: Secondary | ICD-10-CM

## 2014-09-12 DIAGNOSIS — M5136 Other intervertebral disc degeneration, lumbar region: Secondary | ICD-10-CM

## 2014-09-16 ENCOUNTER — Other Ambulatory Visit: Payer: Medicare Other

## 2014-09-19 ENCOUNTER — Ambulatory Visit
Admission: RE | Admit: 2014-09-19 | Discharge: 2014-09-19 | Disposition: A | Discharge status: Home / Self Care | Payer: Commercial Managed Care - HMO | Source: Ambulatory Visit | Attending: Orthopedic Surgery | Admitting: Orthopedic Surgery

## 2014-09-19 ENCOUNTER — Ambulatory Visit
Admission: RE | Admit: 2014-09-19 | Discharge: 2014-09-19 | Disposition: A | Discharge status: Home / Self Care | Payer: Self-pay | Source: Ambulatory Visit | Attending: Orthopedic Surgery | Admitting: Orthopedic Surgery

## 2014-09-19 ENCOUNTER — Other Ambulatory Visit: Payer: Self-pay | Admitting: Orthopedic Surgery

## 2014-09-19 DIAGNOSIS — M51369 Other intervertebral disc degeneration, lumbar region without mention of lumbar back pain or lower extremity pain: Secondary | ICD-10-CM

## 2014-09-19 DIAGNOSIS — R29898 Other symptoms and signs involving the musculoskeletal system: Secondary | ICD-10-CM

## 2014-09-19 DIAGNOSIS — Z8739 Personal history of other diseases of the musculoskeletal system and connective tissue: Secondary | ICD-10-CM

## 2014-09-19 DIAGNOSIS — M5136 Other intervertebral disc degeneration, lumbar region: Secondary | ICD-10-CM

## 2014-09-19 MED ORDER — ONDANSETRON HCL 4 MG/2ML IJ SOLN
4.0000 mg | Freq: Once | INTRAMUSCULAR | Status: AC
  Administered 2014-09-19: 4 mg via INTRAMUSCULAR

## 2014-09-19 MED ORDER — DIAZEPAM 5 MG PO TABS
5.0000 mg | ORAL_TABLET | Freq: Once | ORAL | Status: AC
  Administered 2014-09-19: 5 mg via ORAL

## 2014-09-19 MED ORDER — MEPERIDINE HCL 100 MG/ML IJ SOLN
75.0000 mg | Freq: Once | INTRAMUSCULAR | Status: AC
  Administered 2014-09-19: 75 mg via INTRAMUSCULAR

## 2014-09-19 MED ORDER — IOHEXOL 180 MG/ML  SOLN
15.0000 mL | Freq: Once | INTRAMUSCULAR | Status: AC | PRN
  Administered 2014-09-19: 15 mL via INTRATHECAL

## 2014-09-19 NOTE — Discharge Instructions (Signed)
Myelogram Discharge Instructions  1. Go home and rest quietly for the next 24 hours.  It is important to lie flat for the next 24 hours.  Get up only to go to the restroom.  You may lie in the bed or on a couch on your back, your stomach, your left side or your right side.  You may have one pillow under your head.  You may have pillows between your knees while you are on your side or under your knees while you are on your back.  2. DO NOT drive today.  Recline the seat as far back as it will go, while still wearing your seat belt, on the way home.  3. You may get up to go to the bathroom as needed.  You may sit up for 10 minutes to eat.  You may resume your normal diet and medications unless otherwise indicated.  Drink lots of extra fluids today and tomorrow.  4. The incidence of headache, nausea, or vomiting is about 5% (one in 20 patients).  If you develop a headache, lie flat and drink plenty of fluids until the headache goes away.  Caffeinated beverages may be helpful.  If you develop severe nausea and vomiting or a headache that does not go away with flat bed rest, call [phone].  5. You may resume normal activities after your 24 hours of bed rest is over; however, do not exert yourself strongly or do any heavy lifting tomorrow. If when you get up you have a headache when standing, go back to bed and force fluids for another 24 hours.  6. Call your physician for a follow-up appointment.  The results of your myelogram will be sent directly to your physician by the following day.  7. If you have any questions or if complications develop after you arrive home, please call [phone].  Discharge instructions have been explained to the patient.  The patient, or the person responsible for the patient, fully understands these instructions.      May resume Duloxetine, Tramadol and Citalopram on Nov. 10, 2015, after 9:30 am.

## 2014-09-19 NOTE — Progress Notes (Signed)
Pt states she has been off Duloxetin, Citalopram and Tramadol for the past 2-3 days.  Discharge instructions explained to pt.

## 2014-10-03 ENCOUNTER — Other Ambulatory Visit (HOSPITAL_COMMUNITY): Payer: Self-pay | Admitting: *Deleted

## 2014-10-03 NOTE — Progress Notes (Signed)
Please put orders in Epic surgery 10-12-14 pre op 10-04-14 Thanks

## 2014-10-04 ENCOUNTER — Ambulatory Visit (HOSPITAL_COMMUNITY)
Admission: RE | Admit: 2014-10-04 | Discharge: 2014-10-04 | Disposition: A | Discharge status: Home / Self Care | Payer: Medicare HMO | Source: Ambulatory Visit | Attending: Surgical | Admitting: Surgical

## 2014-10-04 ENCOUNTER — Encounter (HOSPITAL_COMMUNITY)
Admission: RE | Admit: 2014-10-04 | Discharge: 2014-10-04 | Disposition: A | Discharge status: Home / Self Care | Payer: Medicare HMO | Source: Ambulatory Visit | Attending: Orthopedic Surgery | Admitting: Orthopedic Surgery

## 2014-10-04 ENCOUNTER — Encounter (HOSPITAL_COMMUNITY): Payer: Self-pay

## 2014-10-04 ENCOUNTER — Other Ambulatory Visit: Payer: Self-pay | Admitting: Surgical

## 2014-10-04 DIAGNOSIS — Z01818 Encounter for other preprocedural examination: Secondary | ICD-10-CM | POA: Insufficient documentation

## 2014-10-04 DIAGNOSIS — M549 Dorsalgia, unspecified: Secondary | ICD-10-CM | POA: Insufficient documentation

## 2014-10-04 HISTORY — DX: Insomnia, unspecified: G47.00

## 2014-10-04 HISTORY — DX: Pure hypercholesterolemia, unspecified: E78.00

## 2014-10-04 HISTORY — DX: Gastro-esophageal reflux disease without esophagitis: K21.9

## 2014-10-04 HISTORY — DX: Personal history of other infectious and parasitic diseases: Z86.19

## 2014-10-04 LAB — URINALYSIS, ROUTINE W REFLEX MICROSCOPIC
Bilirubin Urine: NEGATIVE
Glucose, UA: NEGATIVE mg/dL
Hgb urine dipstick: NEGATIVE
Ketones, ur: NEGATIVE mg/dL
Leukocytes, UA: NEGATIVE
Nitrite: NEGATIVE
Protein, ur: NEGATIVE mg/dL
Specific Gravity, Urine: 1.018 (ref 1.005–1.030)
Urobilinogen, UA: 0.2 mg/dL (ref 0.0–1.0)
pH: 5.5 (ref 5.0–8.0)

## 2014-10-04 LAB — CBC WITH DIFFERENTIAL/PLATELET
Basophils Absolute: 0.1 10*3/uL (ref 0.0–0.1)
Basophils Relative: 1 % (ref 0–1)
Eosinophils Absolute: 0.7 10*3/uL (ref 0.0–0.7)
Eosinophils Relative: 8 % — ABNORMAL HIGH (ref 0–5)
HCT: 35 % — ABNORMAL LOW (ref 36.0–46.0)
Hemoglobin: 11.7 g/dL — ABNORMAL LOW (ref 12.0–15.0)
Lymphocytes Relative: 33 % (ref 12–46)
Lymphs Abs: 3.3 10*3/uL (ref 0.7–4.0)
MCH: 33.1 pg (ref 26.0–34.0)
MCHC: 33.4 g/dL (ref 30.0–36.0)
MCV: 98.9 fL (ref 78.0–100.0)
Monocytes Absolute: 0.5 10*3/uL (ref 0.1–1.0)
Monocytes Relative: 5 % (ref 3–12)
Neutro Abs: 5.3 10*3/uL (ref 1.7–7.7)
Neutrophils Relative %: 53 % (ref 43–77)
Platelets: 535 10*3/uL — ABNORMAL HIGH (ref 150–400)
RBC: 3.54 MIL/uL — ABNORMAL LOW (ref 3.87–5.11)
RDW: 17 % — ABNORMAL HIGH (ref 11.5–15.5)
WBC: 9.9 10*3/uL (ref 4.0–10.5)

## 2014-10-04 LAB — COMPREHENSIVE METABOLIC PANEL
ALT: 47 U/L — ABNORMAL HIGH (ref 0–35)
AST: 149 U/L — ABNORMAL HIGH (ref 0–37)
Albumin: 3.6 g/dL (ref 3.5–5.2)
Alkaline Phosphatase: 331 U/L — ABNORMAL HIGH (ref 39–117)
Anion gap: 14 (ref 5–15)
BUN: 17 mg/dL (ref 6–23)
CO2: 26 mEq/L (ref 19–32)
Calcium: 9.6 mg/dL (ref 8.4–10.5)
Chloride: 97 mEq/L (ref 96–112)
Creatinine, Ser: 0.79 mg/dL (ref 0.50–1.10)
GFR calc Af Amer: >90 mL/min (ref >90)
GFR calc non Af Amer: 85 mL/min — ABNORMAL LOW (ref >90)
Glucose, Bld: 93 mg/dL (ref 70–99)
Potassium: 4.8 mEq/L (ref 3.7–5.3)
Sodium: 137 mEq/L (ref 137–147)
Total Bilirubin: 0.3 mg/dL (ref 0.3–1.2)
Total Protein: 8.2 g/dL (ref 6.0–8.3)

## 2014-10-04 LAB — PROTIME-INR
INR: 1.17 (ref 0.00–1.49)
Prothrombin Time: 15 seconds (ref 11.6–15.2)

## 2014-10-04 LAB — SURGICAL PCR SCREEN
MRSA, PCR: NEGATIVE
Staphylococcus aureus: NEGATIVE

## 2014-10-04 LAB — APTT: aPTT: 39 seconds — ABNORMAL HIGH (ref 24–37)

## 2014-10-04 NOTE — Patient Instructions (Addendum)
[address] JENNIFR GAETA  10/04/2014   Your procedure is scheduled on: Wednesday 10/12/14  Report to [place] at 12:30 PM.  Call this number if you have problems the morning of surgery [phone]   Remember: Please bring inhaler on day of surgery   Do not eat food After Midnight. Clear liquids from midnight until 08:30am on 10/12/14 then nothing.       Take these medicines the morning of surgery with A SIP OF WATER:inhaler if needed, citalopram, pepcid, singulair, simvastatin    Do not wear jewelry, make-up or nail polish.  Do not wear lotions, powders, or perfumes.   Do not shave 48 hours prior to surgery. Men may shave face and neck.  Do not bring valuables to the hospital.  Contacts, dentures or bridgework may not be worn into surgery.  Leave suitcase in the car. After surgery it may be brought to your room.  For patients admitted to the hospital, checkout time is 11:00 AM the day of discharge.   Please read over the following fact sheets that you were given:[facility] - Preparing for Surgery Before surgery, you can play an important role.  Because skin is not sterile, your skin needs to be as free of germs as possible.  You can reduce the number of germs on your skin by washing with CHG (chlorahexidine gluconate) soap before surgery.  CHG is an antiseptic cleaner which kills germs and bonds with the skin to continue killing germs even after washing. Please DO NOT use if you have an allergy to CHG or antibacterial soaps.  If your skin becomes reddened/irritated stop using the CHG and inform your nurse when you arrive at Short Stay. Do not shave (including legs and underarms) for at least 48 hours prior to the first CHG shower.  You may shave your face/neck. Please follow these instructions carefully:  1.  Shower with CHG Soap the night before surgery and the  morning of Surgery.  2.  If you choose to wash your hair, wash your hair first as usual with  your  normal  shampoo.  3.  After you shampoo, rinse your hair and body thoroughly to remove the  shampoo.                            4.  Use CHG as you would any other liquid soap.  You can apply chg directly  to the skin and wash                       Gently with a scrungie or clean washcloth.  5.  Apply the CHG Soap to your body ONLY FROM THE NECK DOWN.   Do not use on face/ open                           Wound or open sores. Avoid contact with eyes, ears mouth and genitals (private parts).                       Wash face,  Genitals (private parts) with your normal soap.             6.  Wash thoroughly, paying special attention to the area where your surgery  will be performed.  7.  Thoroughly rinse your body with warm water from  the neck down.  8.  DO NOT shower/wash with your normal soap after using and rinsing off  the CHG Soap.                9.  Pat yourself dry with a clean towel.            10.  Wear clean pajamas.            11.  Place clean sheets on your bed the night of your first shower and do not  sleep with pets. Day of Surgery : Do not apply any lotions/deodorants the morning of surgery.  Please wear clean clothes to the hospital/surgery center.  FAILURE TO FOLLOW THESE INSTRUCTIONS MAY RESULT IN THE CANCELLATION OF YOUR SURGERY PATIENT SIGNATURE_________________________________  NURSE SIGNATURE__________________________________  ________________________________________________________________________  WHAT IS A BLOOD TRANSFUSION? Blood Transfusion Information  A transfusion is the replacement of blood or some of its parts. Blood is made up of multiple cells which provide different functions.  Red blood cells carry oxygen and are used for blood loss replacement.  White blood cells fight against infection.  Platelets control bleeding.  Plasma helps clot blood.  Other blood products are available for specialized needs, such as hemophilia or other clotting  disorders. BEFORE THE TRANSFUSION  Who gives blood for transfusions?   Healthy volunteers who are fully evaluated to make sure their blood is safe. This is blood bank blood. Transfusion therapy is the safest it has ever been in the practice of medicine. Before blood is taken from a donor, a complete history is taken to make sure that person has no history of diseases nor engages in risky social behavior (examples are intravenous drug use or sexual activity with multiple partners). The donor's travel history is screened to minimize risk of transmitting infections, such as malaria. The donated blood is tested for signs of infectious diseases, such as HIV and hepatitis. The blood is then tested to be sure it is compatible with you in order to minimize the chance of a transfusion reaction. If you or a relative donates blood, this is often done in anticipation of surgery and is not appropriate for emergency situations. It takes many days to process the donated blood. RISKS AND COMPLICATIONS Although transfusion therapy is very safe and saves many lives, the main dangers of transfusion include:  1. Getting an infectious disease. 2. Developing a transfusion reaction. This is an allergic reaction to something in the blood you were given. Every precaution is taken to prevent this. The decision to have a blood transfusion has been considered carefully by your caregiver before blood is given. Blood is not given unless the benefits outweigh the risks. AFTER THE TRANSFUSION  Right after receiving a blood transfusion, you will usually feel much better and more energetic. This is especially true if your red blood cells have gotten low (anemic). The transfusion raises the level of the red blood cells which carry oxygen, and this usually causes an energy increase.  The nurse administering the transfusion will monitor you carefully for complications. HOME CARE INSTRUCTIONS  No special instructions are needed after a  transfusion. You may find your energy is better. Speak with your caregiver about any limitations on activity for underlying diseases you may have. SEEK MEDICAL CARE IF:   Your condition is not improving after your transfusion.  You develop redness or irritation at the intravenous (IV) site. SEEK IMMEDIATE MEDICAL CARE IF:  Any of the following symptoms occur over the next 12  hours:  Shaking chills.  You have a temperature by mouth above 102 F (38.9 C), not controlled by medicine.  Chest, back, or muscle pain.  People around you feel you are not acting correctly or are confused.  Shortness of breath or difficulty breathing.  Dizziness and fainting.  You get a rash or develop hives.  You have a decrease in urine output.  Your urine turns a dark color or changes to pink, red, or brown. Any of the following symptoms occur over the next 10 days:  You have a temperature by mouth above 102 F (38.9 C), not controlled by medicine.  Shortness of breath.  Weakness after normal activity.  The white part of the eye turns yellow (jaundice).  You have a decrease in the amount of urine or are urinating less often.  Your urine turns a dark color or changes to pink, red, or brown. Document Released: 10/25/2000 Document Revised: 01/20/2012 Document Reviewed: 06/13/2008 ExitCare Patient Information 2014 [place].  _______________________________________________________________________  Incentive Spirometer  An incentive spirometer is a tool that can help keep your lungs clear and active. This tool measures how well you are filling your lungs with each breath. Taking long deep breaths may help reverse or decrease the chance of developing breathing (pulmonary) problems (especially infection) following:  A long period of time when you are unable to move or be active. BEFORE THE PROCEDURE   If the spirometer includes an indicator to show your best effort, your nurse or  respiratory therapist will set it to a desired goal.  If possible, sit up straight or lean slightly forward. Try not to slouch.  Hold the incentive spirometer in an upright position. INSTRUCTIONS FOR USE  3. Sit on the edge of your bed if possible, or sit up as far as you can in bed or on a chair. 4. Hold the incentive spirometer in an upright position. 5. Breathe out normally. 6. Place the mouthpiece in your mouth and seal your lips tightly around it. 7. Breathe in slowly and as deeply as possible, raising the piston or the ball toward the top of the column. 8. Hold your breath for 3-5 seconds or for as long as possible. Allow the piston or ball to fall to the bottom of the column. 9. Remove the mouthpiece from your mouth and breathe out normally. 10. Rest for a few seconds and repeat Steps 1 through 7 at least 10 times every 1-2 hours when you are awake. Take your time and take a few normal breaths between deep breaths. 11. The spirometer may include an indicator to show your best effort. Use the indicator as a goal to work toward during each repetition. 12. After each set of 10 deep breaths, practice coughing to be sure your lungs are clear. If you have an incision (the cut made at the time of surgery), support your incision when coughing by placing a pillow or rolled up towels firmly against it. Once you are able to get out of bed, walk around indoors and cough well. You may stop using the incentive spirometer when instructed by your caregiver.  RISKS AND COMPLICATIONS  Take your time so you do not get dizzy or light-headed.  If you are in pain, you may need to take or ask for pain medication before doing incentive spirometry. It is harder to take a deep breath if you are having pain. AFTER USE  Rest and breathe slowly and easily.  It can be helpful to keep  track of a log of your progress. Your caregiver can provide you with a simple table to help with this. If you are using the  spirometer at home, follow these instructions: [place] IF:   You are having difficultly using the spirometer.  You have trouble using the spirometer as often as instructed.  Your pain medication is not giving enough relief while using the spirometer.  You develop fever of 100.5 F (38.1 C) or higher. SEEK IMMEDIATE MEDICAL CARE IF:   You cough up bloody sputum that had not been present before.  You develop fever of 102 F (38.9 C) or greater.  You develop worsening pain at or near the incision site. MAKE SURE YOU:   Understand these instructions.  Will watch your condition.  Will get help right away if you are not doing well or get worse. Document Released: 03/10/2007 Document Revised: 01/20/2012 Document Reviewed: 05/11/2007 ExitCare Patient Information 2014 ExitCare, [place].   ________________________________________________________________________    CLEAR LIQUID DIET   Foods Allowed                                                                     Foods Excluded  Coffee and tea, regular and decaf                             liquids that you cannot  Plain Jell-O in any flavor                                             see through such as: Fruit ices (not with fruit pulp)                                     milk, soups, orange juice  Iced Popsicles                                    All solid food Carbonated beverages, regular and diet                                    Cranberry, grape and apple juices Sports drinks like Gatorade Lightly seasoned clear broth or consume(fat free) Sugar, honey syrup  Sample Menu Breakfast                                Lunch                                     Supper Cranberry juice                    Beef broth  Chicken broth Jell-O                                     Grape juice                           Apple juice Coffee or tea                        Jell-O                                       Popsicle                                                Coffee or tea                        Coffee or tea  _____________________________________________________________________

## 2014-10-04 NOTE — Progress Notes (Signed)
CMP and PTT results done 10/04/2014 faxede via EPIC to Dr Gladstone Lighter.

## 2014-10-04 NOTE — Progress Notes (Signed)
Chest x-ray 02/28/14 on EPIC, EKG 03/02/14 on EPIC

## 2014-10-05 NOTE — H&P (Signed)
[name] is an 68 y.o. female.   Chief Complaint: back pain HPI: The patient is a 68 year old female who presents with the chief complaint of low back pain. The patient reports low back symptoms including low back pain which began over a year ago without any known injury. Symptoms include pain, numbness (bilateral feet) and tingling (bilateral legs). The pain radiates to the left buttock, left thigh, left lower leg, left foot, right buttock, right thigh, right lower leg and right foot. The patient describes the pain as dull, aching and tingling. The patient describes the severity of their symptoms as moderate in severity. Symptoms are exacerbated by standing, sitting and lifting. MRI of the lumbar spine showed degenerative changes at multiple levels. CT myelogram showed a herniated disc at L5-S1 on the right.   Past Medical History  Diagnosis Date  . Asthma   . Renal cell carcinoma 2012    left  . Seasonal allergies   . Hypercholesteremia     under control  . GERD (gastroesophageal reflux disease)     occasional  . Arthritis     back-severe, hips, right knee  . H/O mumps   . H/O measles   . Insomnia     Past Surgical History  Procedure Laterality Date  . Kidney surgery  12/2010    [facility]; partial nephrectomy  . Hernia repair  05/4127    supraumbilical repair  . Bunionectomy  04/2011  . Menisectomy  2010    left knee  . Cholecystectomy  11/13/2011    Procedure: LAPAROSCOPIC CHOLECYSTECTOMY WITH INTRAOPERATIVE CHOLANGIOGRAM;  Surgeon: Judieth Keens, DO;  Location: [facility];  Service: General;  Laterality: N/A;  . Esophagogastroduodenoscopy N/A 09/25/2013    Procedure: ESOPHAGOGASTRODUODENOSCOPY (EGD);  Surgeon: Lear Ng, MD;  Location: [facility];  Service: Endoscopy;  Laterality: N/A;  . Colonoscopy N/A 09/25/2013    Procedure: COLONOSCOPY;  Surgeon: Lear Ng, MD;  Location: [facility];  Service: Endoscopy;  Laterality: N/A;  . Abdominal hysterectomy   40years ago  . Tubal ligation  44 years ago    Social History:  reports that she has been smoking Cigarettes.  She has been smoking about 0.00 packs per day for the past 6 years. She has never used smokeless tobacco. She reports that she drinks alcohol. She reports that she does not use illicit drugs.  Allergies:  Allergies  Allergen Reactions  . Azithromycin Itching and Swelling     Current outpatient prescriptions:  albuterol (PROVENTIL HFA;VENTOLIN HFA) 108 (90 BASE) MCG/ACT inhaler, Inhale 2 puffs into the lungs every 6 (six) hours as needed for wheezing or shortness of breath. , Disp: , Rfl: calcium-vitamin D (OSCAL WITH D) 500-200 MG-UNIT per tablet, Take 2 tablets by mouth 2 (two) times daily. , Disp: , Rfl: ;   cholecalciferol (VITAMIN D) 1000 UNITS tablet, Take 1,000 Units by mouth every morning., Disp: , Rfl:  citalopram (CELEXA) 10 MG tablet, Take 10 mg by mouth every morning., Disp: , Rfl: ;   Cyanocobalamin (VITAMIN B-12 PO), Take 1 tablet by mouth every morning., Disp: , Rfl: ;   cyclobenzaprine (FLEXERIL) 5 MG tablet, Take 1 tablet (5 mg total) by mouth 3 (three) times daily as needed for muscle spasms., Disp: 20 tablet, Rfl: 0;   dicyclomine (BENTYL) 10 MG capsule, Take 10 mg by mouth 4 (four) times daily -  before meals and at bedtime., Disp: , Rfl:  diphenhydrAMINE (BENADRYL) 25 MG tablet, Take 25 mg by mouth  every 6 (six) hours as needed for allergies. , Disp: , Rfl: ;   famotidine (PEPCID) 20 MG tablet, Take 20 mg by mouth 2 (two) times daily., Disp: , Rfl: ;   Fluticasone-Salmeterol (ADVAIR) 500-50 MCG/DOSE AEPB, Inhale 1 puff into the lungs 2 (two) times daily., Disp: , Rfl: ;   montelukast (SINGULAIR) 10 MG tablet, Take 10 mg by mouth every morning., Disp: , Rfl:  mupirocin cream (BACTROBAN) 2 %, Apply 1 application topically 2 (two) times daily as needed (rash)., Disp: , Rfl: ;   simvastatin (ZOCOR) 40 MG tablet, Take 40 mg by mouth every morning., Disp: , Rfl: ;    traMADol (ULTRAM) 50 MG tablet, Take 50 mg by mouth every 4 (four) hours as needed for moderate pain., Disp: , Rfl: ;   vitamin C (ASCORBIC ACID) 500 MG tablet, Take 1 mg by mouth every morning., Disp: , Rfl:  vitamin E 400 UNIT capsule, Take 400 Units by mouth every morning., Disp: , Rfl: ;     Results for orders placed or performed during the hospital encounter of 10/04/14 (from the past 48 hour(s))  Surgical pcr screen     Status: None   Collection Time: 10/04/14  2:15 PM  Result Value Ref Range   MRSA, PCR NEGATIVE NEGATIVE   Staphylococcus aureus NEGATIVE NEGATIVE    Comment:        The Xpert SA Assay (FDA approved for NASAL specimens in patients over 27 years of age), is one component of a comprehensive surveillance program.  Test performance has been validated by [employer] for patients greater than or equal to 84 year old. It is not intended to diagnose infection nor to guide or monitor treatment.   Urinalysis, Routine w reflex microscopic     Status: Abnormal   Collection Time: 10/04/14  2:17 PM  Result Value Ref Range   Color, Urine [name] (A) YELLOW    Comment: BIOCHEMICALS MAY BE AFFECTED BY COLOR   APPearance CLOUDY (A) CLEAR   Specific Gravity, Urine 1.018 1.005 - 1.030   pH 5.5 5.0 - 8.0   Glucose, UA NEGATIVE NEGATIVE mg/dL   Hgb urine dipstick NEGATIVE NEGATIVE   Bilirubin Urine NEGATIVE NEGATIVE   Ketones, ur NEGATIVE NEGATIVE mg/dL   Protein, ur NEGATIVE NEGATIVE mg/dL   Urobilinogen, UA 0.2 0.0 - 1.0 mg/dL   Nitrite NEGATIVE NEGATIVE   Leukocytes, UA NEGATIVE NEGATIVE    Comment: MICROSCOPIC NOT DONE ON URINES WITH NEGATIVE PROTEIN, BLOOD, LEUKOCYTES, NITRITE, OR GLUCOSE <1000 mg/dL.  APTT     Status: Abnormal   Collection Time: 10/04/14  2:30 PM  Result Value Ref Range   aPTT 39 (H) 24 - 37 seconds    Comment:        IF BASELINE aPTT IS ELEVATED, SUGGEST PATIENT RISK ASSESSMENT BE USED TO DETERMINE APPROPRIATE ANTICOAGULANT THERAPY.   CBC  WITH DIFFERENTIAL     Status: Abnormal   Collection Time: 10/04/14  2:30 PM  Result Value Ref Range   WBC 9.9 4.0 - 10.5 K/uL   RBC 3.54 (L) 3.87 - 5.11 MIL/uL   Hemoglobin 11.7 (L) 12.0 - 15.0 g/dL   HCT 35.0 (L) 36.0 - 46.0 %   MCV 98.9 78.0 - 100.0 fL   MCH 33.1 26.0 - 34.0 pg   MCHC 33.4 30.0 - 36.0 g/dL   RDW 17.0 (H) 11.5 - 15.5 %   Platelets 535 (H) 150 - 400 K/uL   Neutrophils Relative % 53 43 -  77 %   Neutro Abs 5.3 1.7 - 7.7 K/uL   Lymphocytes Relative 33 12 - 46 %   Lymphs Abs 3.3 0.7 - 4.0 K/uL   Monocytes Relative 5 3 - 12 %   Monocytes Absolute 0.5 0.1 - 1.0 K/uL   Eosinophils Relative 8 (H) 0 - 5 %   Eosinophils Absolute 0.7 0.0 - 0.7 K/uL   Basophils Relative 1 0 - 1 %   Basophils Absolute 0.1 0.0 - 0.1 K/uL  Comprehensive metabolic panel     Status: Abnormal   Collection Time: 10/04/14  2:30 PM  Result Value Ref Range   Sodium 137 137 - 147 mEq/L   Potassium 4.8 3.7 - 5.3 mEq/L   Chloride 97 96 - 112 mEq/L   CO2 26 19 - 32 mEq/L   Glucose, Bld 93 70 - 99 mg/dL   BUN 17 6 - 23 mg/dL   Creatinine, Ser 0.79 0.50 - 1.10 mg/dL   Calcium 9.6 8.4 - 10.5 mg/dL   Total Protein 8.2 6.0 - 8.3 g/dL   Albumin 3.6 3.5 - 5.2 g/dL   AST 149 (H) 0 - 37 U/L   ALT 47 (H) 0 - 35 U/L   Alkaline Phosphatase 331 (H) 39 - 117 U/L   Total Bilirubin 0.3 0.3 - 1.2 mg/dL   GFR calc non Af Amer 85 (L) >90 mL/min   GFR calc Af Amer >90 >90 mL/min    Comment: (NOTE) The eGFR has been calculated using the CKD EPI equation. This calculation has not been validated in all clinical situations. eGFR's persistently <90 mL/min signify possible Chronic Kidney Disease.    Anion gap 14 5 - 15  Protime-INR     Status: None   Collection Time: 10/04/14  2:30 PM  Result Value Ref Range   Prothrombin Time 15.0 11.6 - 15.2 seconds   INR 1.17 0.00 - 1.49  Type and screen     Status: None   Collection Time: 10/04/14  2:30 PM  Result Value Ref Range   ABO/RH(D) O POS    Antibody Screen NEG     Sample Expiration 10/18/2014    Dg Lumbar Spine 2-3 Views  10/04/2014   CLINICAL DATA:  Back surgery.  Pain.  EXAM: LUMBAR SPINE - 2-3 VIEW  COMPARISON:  CT 09/19/2014.  FINDINGS: Prominent degenerative changes again noted at L3-L4, L4-5, L5-S1. No acute bony abnormality identified. Normal alignment. Aortoiliac atherosclerotic vascular disease. Surgical clips upper abdomen. Pelvic phleboliths.  IMPRESSION: Severe degenerative changes lumbar spine L3-L4, L4-L5, and L5-S1 again noted. Normal alignment.   Electronically Signed   By: Marcello Moores  Register   On: 10/04/2014 16:05    Review of Systems  Constitutional: Negative.   HENT: Negative.   Eyes: Negative.   Respiratory: Positive for cough. Negative for hemoptysis, sputum production, shortness of breath and wheezing.   Cardiovascular: Negative.   Gastrointestinal: Negative.   Genitourinary: Negative.   Musculoskeletal: Positive for myalgias, back pain and joint pain. Negative for falls and neck pain.  Skin: Positive for itching. Negative for rash.  Neurological: Negative.   Endo/Heme/Allergies: Negative.   Psychiatric/Behavioral: Negative.    Vitals Weight: 155 lb Height: 68 in Body Surface Area: 1.84 m Body Mass Index: 23.57 kg/m BP: 118/76 (Sitting, Left Arm, Standard)  HR: 72 bpm  Physical Exam  Constitutional: She is oriented to person, place, and time. She appears well-developed and well-nourished. No distress.  HENT:  Head: Normocephalic and atraumatic.  Right Ear: External ear  normal.  Left Ear: External ear normal.  Nose: Nose normal.  Mouth/Throat: Oropharynx is clear and moist.  Eyes: Conjunctivae and EOM are normal.  Neck: Normal range of motion. Neck supple.  Cardiovascular: Normal rate, regular rhythm, normal heart sounds and intact distal pulses.   No murmur heard. Respiratory: Effort normal and breath sounds normal. No respiratory distress. She has no wheezes.  GI: Soft. Bowel sounds are normal. She  exhibits no distension. There is no tenderness.  Musculoskeletal:       Right hip: Normal.       Left hip: Normal.       Right knee: Normal.       Left knee: Normal.       Lumbar back: She exhibits decreased range of motion, tenderness, pain and spasm.       Right lower leg: She exhibits no tenderness and no swelling.       Left lower leg: She exhibits no tenderness and no swelling.  Neurological: She is alert and oriented to person, place, and time. She has normal reflexes. No sensory deficit.  She is weak on dorsiflexors and toe extensors of the right foot.   Skin: No rash noted. She is not diaphoretic. No erythema.  Psychiatric: She has a normal mood and affect. Her behavior is normal.     Assessment/Plan Herniated lumbar disc L5-S1 right She needs a hemilaminectomy and microdiscectomy L5-S1 on the right. The possible complications of spinal surgery number one could be infection, which is extremely rare. We do use antibiotics prior to the surgery and during surgery and after surgery. Number two is always a slight degree of probability that you could develop a blood clot in your leg after any type of surgery and we try our best to prevent that with aspirin post op when it is safe to begin. The third is a dural leak. That is the spinal fluid leak that could occur. At certain rare times the bone or the disc could literally stick to the dura which is the lining which contains the spinal fluid and we could develop a small tear in that lining which we then patch up. That is an extremely rare complication. The last and final complication is a recurrent disc rupture. That means that you could rupture another small piece of disc later on down the road and there is about a 2% chance of that.   H&P performed by Dr. Latanya Maudlin Documented by Ardeen Jourdain, PA-C  [place], Sabria Florido Ander Purpura 10/05/2014, 8:42 AM

## 2014-10-12 ENCOUNTER — Ambulatory Visit (HOSPITAL_COMMUNITY): Payer: Medicare HMO

## 2014-10-12 ENCOUNTER — Ambulatory Visit (HOSPITAL_COMMUNITY)
Admission: RE | Admit: 2014-10-12 | Discharge: 2014-10-13 | Disposition: A | Discharge status: Home / Self Care | Payer: Medicare HMO | Source: Ambulatory Visit | Attending: Orthopedic Surgery | Admitting: Orthopedic Surgery

## 2014-10-12 ENCOUNTER — Encounter (HOSPITAL_COMMUNITY)
Admission: RE | Disposition: A | Discharge status: Home / Self Care | Payer: Self-pay | Source: Ambulatory Visit | Attending: Orthopedic Surgery

## 2014-10-12 ENCOUNTER — Ambulatory Visit (HOSPITAL_COMMUNITY): Payer: Medicare HMO | Admitting: Anesthesiology

## 2014-10-12 ENCOUNTER — Encounter (HOSPITAL_COMMUNITY): Payer: Self-pay | Admitting: *Deleted

## 2014-10-12 DIAGNOSIS — M21371 Foot drop, right foot: Secondary | ICD-10-CM | POA: Insufficient documentation

## 2014-10-12 DIAGNOSIS — M5126 Other intervertebral disc displacement, lumbar region: Secondary | ICD-10-CM | POA: Diagnosis present

## 2014-10-12 DIAGNOSIS — Z85528 Personal history of other malignant neoplasm of kidney: Secondary | ICD-10-CM | POA: Diagnosis not present

## 2014-10-12 DIAGNOSIS — E780 Pure hypercholesterolemia: Secondary | ICD-10-CM | POA: Insufficient documentation

## 2014-10-12 DIAGNOSIS — J45909 Unspecified asthma, uncomplicated: Secondary | ICD-10-CM | POA: Diagnosis not present

## 2014-10-12 DIAGNOSIS — M4806 Spinal stenosis, lumbar region: Secondary | ICD-10-CM | POA: Insufficient documentation

## 2014-10-12 DIAGNOSIS — M1990 Unspecified osteoarthritis, unspecified site: Secondary | ICD-10-CM | POA: Insufficient documentation

## 2014-10-12 DIAGNOSIS — M1389 Other specified arthritis, multiple sites: Secondary | ICD-10-CM | POA: Diagnosis not present

## 2014-10-12 DIAGNOSIS — F17210 Nicotine dependence, cigarettes, uncomplicated: Secondary | ICD-10-CM | POA: Diagnosis not present

## 2014-10-12 DIAGNOSIS — G4700 Insomnia, unspecified: Secondary | ICD-10-CM | POA: Insufficient documentation

## 2014-10-12 DIAGNOSIS — Z419 Encounter for procedure for purposes other than remedying health state, unspecified: Secondary | ICD-10-CM

## 2014-10-12 DIAGNOSIS — Z881 Allergy status to other antibiotic agents status: Secondary | ICD-10-CM | POA: Insufficient documentation

## 2014-10-12 DIAGNOSIS — K219 Gastro-esophageal reflux disease without esophagitis: Secondary | ICD-10-CM | POA: Diagnosis not present

## 2014-10-12 HISTORY — PX: LUMBAR LAMINECTOMY/DECOMPRESSION MICRODISCECTOMY: SHX5026

## 2014-10-12 LAB — TYPE AND SCREEN
ABO/RH(D): O POS
Antibody Screen: NEGATIVE

## 2014-10-12 SURGERY — LUMBAR LAMINECTOMY/DECOMPRESSION MICRODISCECTOMY 1 LEVEL
Anesthesia: General | Site: Back | Laterality: Right

## 2014-10-12 MED ORDER — OXYCODONE-ACETAMINOPHEN 5-325 MG PO TABS: 1.0000 | ORAL_TABLET | ORAL | Status: DC | PRN

## 2014-10-12 MED ORDER — PHENYLEPHRINE HCL 10 MG/ML IJ SOLN
INTRAMUSCULAR | Status: AC
  Filled 2014-10-12: qty 2

## 2014-10-12 MED ORDER — CITALOPRAM HYDROBROMIDE 10 MG PO TABS
10.0000 mg | ORAL_TABLET | Freq: Every morning | ORAL | Status: DC
  Administered 2014-10-13: 10 mg via ORAL
  Filled 2014-10-12: qty 1

## 2014-10-12 MED ORDER — EPHEDRINE SULFATE 50 MG/ML IJ SOLN
INTRAMUSCULAR | Status: DC | PRN
  Administered 2014-10-12: 5 mg via INTRAVENOUS

## 2014-10-12 MED ORDER — ACETAMINOPHEN 325 MG PO TABS: 650.0000 mg | ORAL_TABLET | ORAL | Status: DC | PRN

## 2014-10-12 MED ORDER — DEXAMETHASONE SODIUM PHOSPHATE 10 MG/ML IJ SOLN
INTRAMUSCULAR | Status: AC
  Filled 2014-10-12: qty 1

## 2014-10-12 MED ORDER — OXYCODONE HCL 5 MG/5ML PO SOLN
5.0000 mg | Freq: Once | ORAL | Status: DC | PRN
  Filled 2014-10-12: qty 5

## 2014-10-12 MED ORDER — MORPHINE SULFATE 2 MG/ML IJ SOLN
1.0000 mg | INTRAMUSCULAR | Status: DC | PRN
  Administered 2014-10-13: 1 mg via INTRAVENOUS
  Filled 2014-10-12: qty 1

## 2014-10-12 MED ORDER — HYDROMORPHONE HCL 1 MG/ML IJ SOLN
INTRAMUSCULAR | Status: AC
  Administered 2014-10-12: 0.5 mg via INTRAVENOUS
  Filled 2014-10-12: qty 1

## 2014-10-12 MED ORDER — MENTHOL 3 MG MT LOZG
1.0000 | LOZENGE | OROMUCOSAL | Status: DC | PRN
  Filled 2014-10-12: qty 9

## 2014-10-12 MED ORDER — LACTATED RINGERS IV SOLN: INTRAVENOUS | Status: DC

## 2014-10-12 MED ORDER — FENTANYL CITRATE 0.05 MG/ML IJ SOLN
INTRAMUSCULAR | Status: AC
  Filled 2014-10-12: qty 2

## 2014-10-12 MED ORDER — FENTANYL CITRATE 0.05 MG/ML IJ SOLN
INTRAMUSCULAR | Status: DC | PRN
  Administered 2014-10-12 (×4): 50 ug via INTRAVENOUS

## 2014-10-12 MED ORDER — ROCURONIUM BROMIDE 100 MG/10ML IV SOLN
INTRAVENOUS | Status: DC | PRN
  Administered 2014-10-12: 30 mg via INTRAVENOUS

## 2014-10-12 MED ORDER — FENTANYL CITRATE 0.05 MG/ML IJ SOLN
INTRAMUSCULAR | Status: AC
  Filled 2014-10-12: qty 5

## 2014-10-12 MED ORDER — PHENOL 1.4 % MT LIQD
1.0000 | OROMUCOSAL | Status: DC | PRN
  Filled 2014-10-12: qty 177

## 2014-10-12 MED ORDER — DIPHENHYDRAMINE HCL 50 MG/ML IJ SOLN
INTRAMUSCULAR | Status: DC | PRN
  Administered 2014-10-12: 25 mg via INTRAVENOUS

## 2014-10-12 MED ORDER — SIMVASTATIN 40 MG PO TABS
40.0000 mg | ORAL_TABLET | Freq: Every morning | ORAL | Status: DC
  Administered 2014-10-13: 40 mg via ORAL
  Filled 2014-10-12: qty 1

## 2014-10-12 MED ORDER — THROMBIN 5000 UNITS EX SOLR
OROMUCOSAL | Status: DC | PRN
  Administered 2014-10-12: 16:00:00 via TOPICAL

## 2014-10-12 MED ORDER — ACETAMINOPHEN 325 MG PO TABS: 325.0000 mg | ORAL_TABLET | ORAL | Status: DC | PRN

## 2014-10-12 MED ORDER — SUCCINYLCHOLINE CHLORIDE 20 MG/ML IJ SOLN
INTRAMUSCULAR | Status: DC | PRN
  Administered 2014-10-12: 100 mg via INTRAVENOUS

## 2014-10-12 MED ORDER — ACETAMINOPHEN 160 MG/5ML PO SOLN
325.0000 mg | ORAL | Status: DC | PRN
  Filled 2014-10-12: qty 20.3

## 2014-10-12 MED ORDER — BUPIVACAINE-EPINEPHRINE 0.5% -1:200000 IJ SOLN
INTRAMUSCULAR | Status: DC | PRN
  Administered 2014-10-12: 20 mL

## 2014-10-12 MED ORDER — OXYCODONE-ACETAMINOPHEN 5-325 MG PO TABS
1.0000 | ORAL_TABLET | ORAL | Status: DC | PRN
  Administered 2014-10-12: 1 via ORAL
  Administered 2014-10-13 (×5): 2 via ORAL
  Filled 2014-10-12 (×4): qty 2
  Filled 2014-10-12: qty 1
  Filled 2014-10-12: qty 2

## 2014-10-12 MED ORDER — DEXAMETHASONE SODIUM PHOSPHATE 10 MG/ML IJ SOLN
INTRAMUSCULAR | Status: DC | PRN
  Administered 2014-10-12: 10 mg via INTRAVENOUS

## 2014-10-12 MED ORDER — BACITRACIN-NEOMYCIN-POLYMYXIN 400-5-5000 EX OINT
TOPICAL_OINTMENT | CUTANEOUS | Status: DC | PRN
  Administered 2014-10-12: 1 via TOPICAL

## 2014-10-12 MED ORDER — CEFAZOLIN SODIUM 1-5 GM-% IV SOLN
1.0000 g | Freq: Three times a day (TID) | INTRAVENOUS | Status: AC
  Administered 2014-10-12 – 2014-10-13 (×3): 1 g via INTRAVENOUS
  Filled 2014-10-12 (×3): qty 50

## 2014-10-12 MED ORDER — MIDAZOLAM HCL 5 MG/5ML IJ SOLN
INTRAMUSCULAR | Status: DC | PRN
  Administered 2014-10-12 (×2): 1 mg via INTRAVENOUS

## 2014-10-12 MED ORDER — NEOSTIGMINE METHYLSULFATE 10 MG/10ML IV SOLN
INTRAVENOUS | Status: DC | PRN
  Administered 2014-10-12: 3 mg via INTRAVENOUS

## 2014-10-12 MED ORDER — ACETAMINOPHEN 650 MG RE SUPP: 650.0000 mg | RECTAL | Status: DC | PRN

## 2014-10-12 MED ORDER — GLYCOPYRROLATE 0.2 MG/ML IJ SOLN
INTRAMUSCULAR | Status: DC | PRN
  Administered 2014-10-12: .4 mg via INTRAVENOUS

## 2014-10-12 MED ORDER — LIDOCAINE HCL (CARDIAC) 20 MG/ML IV SOLN
INTRAVENOUS | Status: AC
  Filled 2014-10-12: qty 5

## 2014-10-12 MED ORDER — DIPHENHYDRAMINE HCL 50 MG/ML IJ SOLN
INTRAMUSCULAR | Status: AC
  Filled 2014-10-12: qty 1

## 2014-10-12 MED ORDER — DEXTROSE 5 % IV SOLN
20.0000 mg | INTRAVENOUS | Status: DC | PRN
  Administered 2014-10-12: 10 ug/min via INTRAVENOUS

## 2014-10-12 MED ORDER — FLEET ENEMA 7-19 GM/118ML RE ENEM: 1.0000 | ENEMA | Freq: Once | RECTAL | Status: AC | PRN

## 2014-10-12 MED ORDER — PROPOFOL 10 MG/ML IV BOLUS
INTRAVENOUS | Status: AC
  Filled 2014-10-12: qty 20

## 2014-10-12 MED ORDER — METHOCARBAMOL 500 MG PO TABS
500.0000 mg | ORAL_TABLET | Freq: Four times a day (QID) | ORAL | Status: DC | PRN
Start: 2014-10-12 — End: 2014-10-13
  Administered 2014-10-13 (×3): 500 mg via ORAL
  Filled 2014-10-12 (×3): qty 1

## 2014-10-12 MED ORDER — PHENYLEPHRINE HCL 10 MG/ML IJ SOLN
INTRAMUSCULAR | Status: DC | PRN
  Administered 2014-10-12 (×3): 80 ug via INTRAVENOUS

## 2014-10-12 MED ORDER — CEFAZOLIN SODIUM-DEXTROSE 2-3 GM-% IV SOLR
INTRAVENOUS | Status: AC
  Filled 2014-10-12: qty 50

## 2014-10-12 MED ORDER — HYDROMORPHONE HCL 1 MG/ML IJ SOLN
0.2500 mg | INTRAMUSCULAR | Status: DC | PRN
  Administered 2014-10-12 (×4): 0.5 mg via INTRAVENOUS

## 2014-10-12 MED ORDER — HYDROMORPHONE HCL 1 MG/ML IJ SOLN
INTRAMUSCULAR | Status: AC
  Filled 2014-10-12: qty 1

## 2014-10-12 MED ORDER — BUPIVACAINE-EPINEPHRINE (PF) 0.25% -1:200000 IJ SOLN
INTRAMUSCULAR | Status: AC
  Filled 2014-10-12: qty 30

## 2014-10-12 MED ORDER — OXYCODONE HCL 5 MG PO TABS: 5.0000 mg | ORAL_TABLET | Freq: Once | ORAL | Status: DC | PRN

## 2014-10-12 MED ORDER — PHENYLEPHRINE 40 MCG/ML (10ML) SYRINGE FOR IV PUSH (FOR BLOOD PRESSURE SUPPORT)
PREFILLED_SYRINGE | INTRAVENOUS | Status: AC
  Filled 2014-10-12: qty 10

## 2014-10-12 MED ORDER — HYDROMORPHONE HCL 1 MG/ML IJ SOLN
0.5000 mg | INTRAMUSCULAR | Status: DC | PRN
  Administered 2014-10-12: 1 mg via INTRAVENOUS
  Administered 2014-10-12: 0.5 mg via INTRAVENOUS
  Filled 2014-10-12 (×2): qty 1

## 2014-10-12 MED ORDER — THROMBIN 5000 UNITS EX SOLR
CUTANEOUS | Status: AC
  Filled 2014-10-12: qty 10000

## 2014-10-12 MED ORDER — DIPHENHYDRAMINE HCL 25 MG PO CAPS
25.0000 mg | ORAL_CAPSULE | Freq: Four times a day (QID) | ORAL | Status: DC | PRN
  Administered 2014-10-12 – 2014-10-13 (×3): 25 mg via ORAL
  Filled 2014-10-12 (×3): qty 1

## 2014-10-12 MED ORDER — FAMOTIDINE 20 MG PO TABS
20.0000 mg | ORAL_TABLET | Freq: Two times a day (BID) | ORAL | Status: DC
  Administered 2014-10-12 – 2014-10-13 (×2): 20 mg via ORAL
  Filled 2014-10-12 (×4): qty 1

## 2014-10-12 MED ORDER — SENNOSIDES-DOCUSATE SODIUM 8.6-50 MG PO TABS: 1.0000 | ORAL_TABLET | Freq: Every evening | ORAL | Status: DC | PRN

## 2014-10-12 MED ORDER — ALBUTEROL SULFATE (2.5 MG/3ML) 0.083% IN NEBU: 3.0000 mL | INHALATION_SOLUTION | Freq: Four times a day (QID) | RESPIRATORY_TRACT | Status: DC | PRN

## 2014-10-12 MED ORDER — ONDANSETRON HCL 4 MG/2ML IJ SOLN: 4.0000 mg | INTRAMUSCULAR | Status: DC | PRN

## 2014-10-12 MED ORDER — PROPOFOL 10 MG/ML IV BOLUS
INTRAVENOUS | Status: DC | PRN
  Administered 2014-10-12: 150 mg via INTRAVENOUS

## 2014-10-12 MED ORDER — BACITRACIN-NEOMYCIN-POLYMYXIN 400-5-5000 EX OINT
TOPICAL_OINTMENT | CUTANEOUS | Status: AC
  Filled 2014-10-12: qty 1

## 2014-10-12 MED ORDER — HYDROXYZINE HCL 25 MG PO TABS
12.5000 mg | ORAL_TABLET | Freq: Four times a day (QID) | ORAL | Status: DC | PRN
  Administered 2014-10-12: 12.5 mg via ORAL
  Administered 2014-10-13: 25 mg via ORAL
  Filled 2014-10-12 (×4): qty 1

## 2014-10-12 MED ORDER — CEFAZOLIN SODIUM-DEXTROSE 2-3 GM-% IV SOLR
2.0000 g | INTRAVENOUS | Status: AC
  Administered 2014-10-12: 2 g via INTRAVENOUS

## 2014-10-12 MED ORDER — DEXTROSE 5 % IV SOLN
500.0000 mg | Freq: Four times a day (QID) | INTRAVENOUS | Status: DC | PRN
  Administered 2014-10-12: 500 mg via INTRAVENOUS
  Filled 2014-10-12 (×2): qty 5

## 2014-10-12 MED ORDER — LIDOCAINE HCL (PF) 2 % IJ SOLN
INTRAMUSCULAR | Status: DC | PRN
  Administered 2014-10-12: 80 mg via INTRADERMAL

## 2014-10-12 MED ORDER — MONTELUKAST SODIUM 10 MG PO TABS
10.0000 mg | ORAL_TABLET | Freq: Every day | ORAL | Status: DC
  Administered 2014-10-12: 10 mg via ORAL
  Filled 2014-10-12 (×3): qty 1

## 2014-10-12 MED ORDER — MIDAZOLAM HCL 2 MG/2ML IJ SOLN
INTRAMUSCULAR | Status: AC
  Filled 2014-10-12: qty 2

## 2014-10-12 MED ORDER — BUPIVACAINE LIPOSOME 1.3 % IJ SUSP
20.0000 mL | Freq: Once | INTRAMUSCULAR | Status: AC
  Administered 2014-10-12: 20 mL
  Filled 2014-10-12: qty 20

## 2014-10-12 MED ORDER — LACTATED RINGERS IV SOLN
INTRAVENOUS | Status: DC
  Administered 2014-10-12: 1000 mL via INTRAVENOUS

## 2014-10-12 MED ORDER — MOMETASONE FURO-FORMOTEROL FUM 200-5 MCG/ACT IN AERO
2.0000 | INHALATION_SPRAY | Freq: Two times a day (BID) | RESPIRATORY_TRACT | Status: DC
  Administered 2014-10-12 – 2014-10-13 (×2): 2 via RESPIRATORY_TRACT
  Filled 2014-10-12 (×2): qty 8.8

## 2014-10-12 MED ORDER — SODIUM CHLORIDE 0.9 % IR SOLN
Status: AC
  Filled 2014-10-12: qty 1

## 2014-10-12 MED ORDER — BISACODYL 5 MG PO TBEC: 5.0000 mg | DELAYED_RELEASE_TABLET | Freq: Every day | ORAL | Status: DC | PRN

## 2014-10-12 MED ORDER — GLYCOPYRROLATE 0.2 MG/ML IJ SOLN
INTRAMUSCULAR | Status: AC
  Filled 2014-10-12: qty 2

## 2014-10-12 MED ORDER — ONDANSETRON HCL 4 MG/2ML IJ SOLN
INTRAMUSCULAR | Status: AC
  Filled 2014-10-12: qty 2

## 2014-10-12 MED ORDER — SODIUM CHLORIDE 0.9 % IR SOLN
Status: DC | PRN
  Administered 2014-10-12: 500 mL

## 2014-10-12 SURGICAL SUPPLY — 38 items
BAG SPEC THK2 15X12 ZIP CLS (MISCELLANEOUS) ×1
BAG ZIPLOCK 12X15 (MISCELLANEOUS) ×3 IMPLANT
CLEANER TIP ELECTROSURG 2X2 (MISCELLANEOUS) ×3 IMPLANT
DRAPE MICROSCOPE LEICA (MISCELLANEOUS) ×3 IMPLANT
DRAPE POUCH INSTRU U-SHP 10X18 (DRAPES) ×3 IMPLANT
DRAPE SURG 17X11 SM STRL (DRAPES) ×3 IMPLANT
DRSG ADAPTIC 3X8 NADH LF (GAUZE/BANDAGES/DRESSINGS) ×3 IMPLANT
DRSG PAD ABDOMINAL 8X10 ST (GAUZE/BANDAGES/DRESSINGS) ×2 IMPLANT
DURAPREP 26ML APPLICATOR (WOUND CARE) ×3 IMPLANT
ELECT BLADE TIP CTD 4 INCH (ELECTRODE) ×3 IMPLANT
ELECT REM PT RETURN 9FT ADLT (ELECTROSURGICAL) ×3
ELECTRODE REM PT RTRN 9FT ADLT (ELECTROSURGICAL) ×1 IMPLANT
GAUZE SPONGE 4X4 12PLY STRL (GAUZE/BANDAGES/DRESSINGS) ×3 IMPLANT
GLOVE BIOGEL PI IND STRL 8 (GLOVE) ×1 IMPLANT
GLOVE BIOGEL PI INDICATOR 8 (GLOVE) ×2
GLOVE ECLIPSE 8.0 STRL XLNG CF (GLOVE) ×7 IMPLANT
GOWN STRL REUS W/TWL XL LVL3 (GOWN DISPOSABLE) ×6 IMPLANT
KIT BASIN OR (CUSTOM PROCEDURE TRAY) ×3 IMPLANT
KIT POSITIONING SURG ANDREWS (MISCELLANEOUS) ×3 IMPLANT
MANIFOLD NEPTUNE II (INSTRUMENTS) ×3 IMPLANT
NDL SPNL 18GX3.5 QUINCKE PK (NEEDLE) ×2 IMPLANT
NEEDLE SPNL 18GX3.5 QUINCKE PK (NEEDLE) ×9 IMPLANT
PACK LAMINECTOMY ORTHO (CUSTOM PROCEDURE TRAY) ×3 IMPLANT
PAD ABD 8X10 STRL (GAUZE/BANDAGES/DRESSINGS) ×4 IMPLANT
PATTIES SURGICAL .5 X.5 (GAUZE/BANDAGES/DRESSINGS) ×3 IMPLANT
PATTIES SURGICAL .75X.75 (GAUZE/BANDAGES/DRESSINGS) IMPLANT
PATTIES SURGICAL 1X1 (DISPOSABLE) IMPLANT
RUBBERBAND STERILE (MISCELLANEOUS) ×4 IMPLANT
SPONGE LAP 4X18 X RAY DECT (DISPOSABLE) ×2 IMPLANT
SPONGE SURGIFOAM ABS GEL 100 (HEMOSTASIS) ×3 IMPLANT
STAPLER VISISTAT 35W (STAPLE) ×3 IMPLANT
SUT VIC AB 0 CT1 27 (SUTURE) ×3
SUT VIC AB 0 CT1 27XBRD ANTBC (SUTURE) ×1 IMPLANT
SUT VIC AB 1 CT1 27 (SUTURE) ×9
SUT VIC AB 1 CT1 27XBRD ANTBC (SUTURE) ×3 IMPLANT
SYR 20CC LL (SYRINGE) ×3 IMPLANT
TAPE CLOTH SURG 4X10 WHT LF (GAUZE/BANDAGES/DRESSINGS) ×2 IMPLANT
TOWEL OR 17X26 10 PK STRL BLUE (TOWEL DISPOSABLE) ×3 IMPLANT

## 2014-10-12 NOTE — Anesthesia Preprocedure Evaluation (Addendum)
Anesthesia Evaluation  Patient identified by MRN, date of birth, ID band Patient awake    Reviewed: Allergy & Precautions, H&P , NPO status , Patient's Chart, lab work & pertinent test results  Airway Mallampati: II  TM Distance: >3 FB Neck ROM: Full    Dental  (+) Partial Upper,    Pulmonary neg shortness of breath, asthma , neg sleep apnea, neg recent URI, Current Smoker,  breath sounds clear to auscultation        Cardiovascular negative cardio ROS  Rhythm:Regular     Neuro/Psych Low back pain with right leg symptoms  Neuromuscular disease negative psych ROS   GI/Hepatic Neg liver ROS, GERD-  Medicated and Controlled,  Endo/Other  negative endocrine ROS  Renal/GU negative Renal ROS     Musculoskeletal  (+) Arthritis -, Osteoarthritis,    Abdominal   Peds  Hematology   Anesthesia Other Findings   Reproductive/Obstetrics                            Anesthesia Physical Anesthesia Plan  ASA: II  Anesthesia Plan: General   Post-op Pain Management:    Induction: Intravenous  Airway Management Planned: Oral ETT  Additional Equipment: None  Intra-op Plan:   Post-operative Plan: Extubation in OR  Informed Consent: I have reviewed the patients History and Physical, chart, labs and discussed the procedure including the risks, benefits and alternatives for the proposed anesthesia with the patient or authorized representative who has indicated his/her understanding and acceptance.   Dental advisory given  Plan Discussed with: CRNA and Surgeon  Anesthesia Plan Comments:         Anesthesia Quick Evaluation

## 2014-10-12 NOTE — Interval H&P Note (Signed)
History and Physical Interval Note:  10/12/2014 2:38 PM  [name]  has presented today for surgery, with the diagnosis of HERNIATED DISC   The various methods of treatment have been discussed with the patient and family. After consideration of risks, benefits and other options for treatment, the patient has consented to  Procedure(s): HEMI LAMINECTOMY MICRODISCECTOMY L5-S1 RIGHT (1 LEVEL) (Right) as a surgical intervention .  The patient's history has been reviewed, patient examined, no change in status, stable for surgery.  I have reviewed the patient's chart and labs.  Questions were answered to the patient's satisfaction.     [name] A

## 2014-10-12 NOTE — Transfer of Care (Signed)
Immediate Anesthesia Transfer of Care Note  Patient: [name]  Procedure(s) Performed: Procedure(s): HEMI LAMINECTOMY MICRODISCECTOMY L5-S1 RIGHT (1 LEVEL) (Right)  Patient Location: PACU  Anesthesia Type:General  Level of Consciousness: awake, alert , oriented and patient cooperative  Airway & Oxygen Therapy: Patient Spontanous Breathing and Patient connected to face mask oxygen  Post-op Assessment: Report given to PACU RN, Post -op Vital signs reviewed and stable and Patient moving all extremities  Post vital signs: Reviewed and stable  Complications: No apparent anesthesia complications

## 2014-10-12 NOTE — Anesthesia Postprocedure Evaluation (Signed)
  Anesthesia Post-op Note  Patient: [name]  Procedure(s) Performed: Procedure(s): HEMI LAMINECTOMY MICRODISCECTOMY L5-S1 RIGHT (1 LEVEL) (Right)  Patient Location: PACU  Anesthesia Type:General  Level of Consciousness: awake and alert   Airway and Oxygen Therapy: Patient Spontanous Breathing and Patient connected to nasal cannula oxygen  Post-op Pain: mild  Post-op Assessment: Post-op Vital signs reviewed, Patient's Cardiovascular Status Stable, Respiratory Function Stable, Patent Airway, No signs of Nausea or vomiting and Pain level controlled  Post-op Vital Signs: Reviewed and stable  Last Vitals:  Filed Vitals:   10/12/14 1741  BP: 143/77  Pulse: 100  Temp: 37.6 C  Resp: 16    Complications: No apparent anesthesia complications

## 2014-10-12 NOTE — Plan of Care (Signed)
Problem: Phase I Progression Outcomes Goal: Log roll for position change Outcome: Completed/Met Date Met:  10/12/14

## 2014-10-12 NOTE — Brief Op Note (Signed)
10/12/2014  4:32 PM  PATIENT:  France Ravens  68 y.o. 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  POST-OPERATIVE DIAGNOSIS:Same as Pre-Op  PROCEDURE:  Procedure(s): HEMI LAMINECTOMY MICRODISCECTOMY L5-S1 RIGHT (1 LEVEL) (Right) and Decompression of Lateral Recess for SPINAL STENOSIS.  SURGEON:  Surgeon(s) and Role:    * Tobi Bastos, MD - Primary    * Magnus Sinning, MD - Assisting  :   ASSISTANTS: Tarri Glenn MD   ANESTHESIA:   general  EBL:  Total I/O In: 1000 [I.V.:1000] Out: -   BLOOD ADMINISTERED:none  DRAINS: none   LOCAL MEDICATIONS USED:  MARCAINE 20cc of 0.5% with Epinephrine at the start of the Case and 20cc of Exparel at the end of the case.    SPECIMEN:  Source of Specimen:  L-5-S-1 interspace  DISPOSITION OF SPECIMEN:  PATHOLOGY  COUNTS:  YES  TOURNIQUET:  * No tourniquets in log *  DICTATION: .Other Dictation: Dictation Number [id number]  PLAN OF CARE: Admit for overnight observation  PATIENT DISPOSITION:  PACU - hemodynamically stable.   Delay start of Pharmacological VTE agent (>24hrs) due to surgical blood loss or risk of bleeding: yes

## 2014-10-13 ENCOUNTER — Encounter (HOSPITAL_COMMUNITY): Payer: Self-pay | Admitting: Orthopedic Surgery

## 2014-10-13 DIAGNOSIS — M5126 Other intervertebral disc displacement, lumbar region: Secondary | ICD-10-CM | POA: Diagnosis not present

## 2014-10-13 MED ORDER — OXYCODONE-ACETAMINOPHEN 5-325 MG PO TABS: 1.0000 | ORAL_TABLET | ORAL | Status: DC | PRN

## 2014-10-13 MED ORDER — METHOCARBAMOL 500 MG PO TABS: 500.0000 mg | ORAL_TABLET | Freq: Four times a day (QID) | ORAL | Status: DC | PRN

## 2014-10-13 NOTE — Progress Notes (Signed)
   10/13/14 1007  PT Time Calculation  PT Start Time (ACUTE ONLY) 0859  PT Stop Time (ACUTE ONLY) 0921  PT Time Calculation (min) (ACUTE ONLY) 22 min  PT G-Codes **NOT FOR INPATIENT CLASS**  Functional Assessment Tool Used clinical judgement  Functional Limitation Mobility: Walking and moving around  Mobility: Walking and Moving Around Current Status (N3614) CI  Mobility: Walking and Moving Around Goal Status (E3154) CI  Mobility: Walking and Moving Around Discharge Status (M0867) CI  PT General Charges  $$ ACUTE PT VISIT 1 Procedure  PT Evaluation  $Initial PT Evaluation Tier I 1 Procedure  PT Treatments  $Gait Training 8-22 mins  [place] [phone]

## 2014-10-13 NOTE — Progress Notes (Signed)
RN reviewed discharge instructions with patient and family. All questions answered.   Paperwork and prescriptions given. Patient discharged without equipment due to [facility] not bringing equipment to room. Equipment supposed to be delivered to house per husband  NT rolled patient down in wheelchair to family car.

## 2014-10-13 NOTE — Progress Notes (Signed)
CSW consulted for SNF placement. PN reviewed. PT does not recommend follow up at time of d/c. Pt plans to return home. CSW signing off.  Werner Lean LCSW [phone]

## 2014-10-13 NOTE — Progress Notes (Signed)
Subjective: 1 Day Post-Op Procedure(s) (LRB): HEMI LAMINECTOMY MICRODISCECTOMY L5-S1 RIGHT (1 LEVEL) (Right) Patient reports pain as 1 on 0-10 scale.  Doing well. No Leg Pain.  Objective: Vital signs in last 24 hours: Temp:  [97.1 F (36.2 C)-99.6 F (37.6 C)] 99.5 F (37.5 C) (12/03 0936) Pulse Rate:  [69-102] 100 (12/03 0936) Resp:  [15-19] 16 (12/03 0936) BP: (120-144)/(67-94) 131/68 mmHg (12/03 0936) SpO2:  [97 %-100 %] 99 % (12/03 0936) Weight:  [63.05 kg (139 lb)] 63.05 kg (139 lb) (12/02 1244)  Intake/Output from previous day: 12/02 0701 - 12/03 0700 In: 4317.3 [P.O.:1680; I.V.:2537.3; IV Piggyback:100] Out: 825 [Urine:800; Blood:25] Intake/Output this shift: Total I/O In: 360 [P.O.:360] Out: -   No results for input(s): HGB in the last 72 hours. No results for input(s): WBC, RBC, HCT, PLT in the last 72 hours. No results for input(s): NA, K, CL, CO2, BUN, CREATININE, GLUCOSE, CALCIUM in the last 72 hours. No results for input(s): LABPT, INR in the last 72 hours.  Some weakness of Dorsiflexors.  Assessment/Plan: 1 Day Post-Op Procedure(s) (LRB): HEMI LAMINECTOMY MICRODISCECTOMY L5-S1 RIGHT (1 LEVEL) (Right) Discharge home with home health  Jadavion Spoelstra A 10/13/2014, 9:45 AM

## 2014-10-13 NOTE — Evaluation (Signed)
Physical Therapy Evaluation Patient Details Name: TEGHAN PHILBIN MRN: [id number] DOB: 1946-02-26 Today's Date: 10/13/2014   History of Present Illness  s/p L5 - S1  Clinical Impression  Patient with decreased safety related to back precautions, patient is impulsive and requires frequent cues for safety. Patient did better with a RW for safe ambulation. No further PT indicated. Encouraged patient to walk  At home frequently and work on posture.    Follow Up Recommendations No PT follow up    Equipment Recommendations  Rolling walker with 5" wheels    Recommendations for Other Services       Precautions / Restrictions Precautions Precautions: Back Precaution Comments: recalled 2/3 precautions, has handout Restrictions Weight Bearing Restrictions: No      Mobility  Bed Mobility Overal bed mobility: Needs Assistance Bed Mobility: Sit to Sidelying         Sit to sidelying: Min guard General bed mobility comments: multimodal cues, does not  follow verbal instruction for back precautions  Transfers Overall transfer level: Needs assistance Equipment used: Rolling walker (2 wheeled) Transfers: Sit to/from Stand Sit to Stand: Supervision         General transfer comment: assist to rise, steady, control descent and keep back straight for back precautions  Ambulation/Gait Ambulation/Gait assistance: Min assist;Min guard Ambulation Distance (Feet): 200 Feet         General Gait Details: used RW x 150 then 50 without, very guarded gait without UE support. cues for posture.  Stairs            Wheelchair Mobility    Modified Rankin (Stroke Patients Only)       Balance Overall balance assessment: Needs assistance         Standing balance support: No upper extremity supported   Standing balance comment: min guard for safety                             Pertinent Vitals/Pain Pain Assessment: 0-10 Pain Score: 4  Pain Location: back  Pain  Descriptors / Indicators: Aching Pain Intervention(s): Limited activity within patient's tolerance;Monitored during session;Premedicated before session;Repositioned    Home Living Family/patient expects to be discharged to:: Private residence Living Arrangements: Spouse/significant other Available Help at Discharge: Family Type of Home: House Home Access: Stairs to enter   Technical brewer of Steps: 1 Home Layout: One level Home Equipment: None      Prior Function Level of Independence: Needs assistance         Comments: husband assists with LB adls, helping her in shower     Hand Dominance        Extremity/Trunk Assessment   Upper Extremity Assessment: Overall WFL for tasks assessed           Lower Extremity Assessment: RLE deficits/detail RLE Deficits / Details: pt reports that R leg is numb,  dorsiflexion 3+ at least, not foot drop noted.    Cervical / Trunk Assessment: Other exceptions  Communication   Communication: No difficulties  Cognition Arousal/Alertness: Awake/alert Behavior During Therapy: Impulsive Overall Cognitive Status: Within Functional Limits for tasks assessed (needs reinforcement with back precautions)       Memory: Decreased recall of precautions;Decreased short-term memory              General Comments      Exercises        Assessment/Plan    PT Assessment Patent does not need any further PT  services  PT Diagnosis Difficulty walking   PT Problem List    PT Treatment Interventions     PT Goals (Current goals can be found in the Care Plan section) Acute Rehab PT Goals Patient Stated Goal: to go home. PT Goal Formulation: All assessment and education complete, DC therapy    Frequency     Barriers to discharge        Co-evaluation               End of Session   Activity Tolerance: Patient tolerated treatment well Patient left: in bed;with bed alarm set;with call bell/phone within reach Nurse  Communication: Mobility status    Functional Assessment Tool Used: clinical judgement Functional Limitation: Mobility: Walking and moving around Mobility: Walking and Moving Around Current Status (T6546): At least 1 percent but less than 20 percent impaired, limited or restricted Mobility: Walking and Moving Around Goal Status [phone]): At least 1 percent but less than 20 percent impaired, limited or restricted Mobility: Walking and Moving Around Discharge Status [phone]): At least 1 percent but less than 20 percent impaired, limited or restricted    Time: 0859-0921 PT Time Calculation (min) (ACUTE ONLY): 22 min   Charges:   PT Evaluation $Initial PT Evaluation Tier I: 1 Procedure PT Treatments $Gait Training: 8-22 mins   PT G Codes:       Claretha Cooper 10/13/2014, 10:08 AM

## 2014-10-13 NOTE — Evaluation (Signed)
Occupational Therapy Evaluation Patient Details Name: RIYANNA CRUTCHLEY MRN: [id number] DOB: 05-22-1946 Today's Date: 10/13/2014    History of Present Illness s/p L5 - S1 decompression.   Clinical Impression   Pt is a 68 year old female who was admitted for the above surgery.  Husband has been assisting her at home.  She needs reinforcement with back precautions.  Will plan to return prior to discharge     Follow Up Recommendations  Supervision/Assistance - 24 hour;Home health OT    Equipment Recommendations  3 in 1 bedside comode (also recommend tub seat:  will talk to husband)    Recommendations for Other Services       Precautions / Restrictions Precautions Precautions: Back Restrictions Weight Bearing Restrictions: No      Mobility Bed Mobility               General bed mobility comments: pt was at EOB with NT  Transfers Overall transfer level: Needs assistance Equipment used: None Transfers: Sit to/from Stand Sit to Stand: Min assist         General transfer comment: assist to rise, steady, control descent and keep back straight for back precautions    Balance Overall balance assessment: Needs assistance         Standing balance support: No upper extremity supported   Standing balance comment: min guard for safety                            ADL Overall ADL's : Needs assistance/impaired     Grooming: Oral care;Set up;Sitting   Upper Body Bathing: Set up;Sitting   Lower Body Bathing: Moderate assistance;Sit to/from stand   Upper Body Dressing : Minimal assistance;Sitting (iv)   Lower Body Dressing: Maximal assistance;Sit to/from stand   Toilet Transfer: Minimal assistance;BSC;Stand-pivot   Toileting- Water quality scientist and Hygiene: Moderate assistance;Sit to/from stand         General ADL Comments: pt's husband is a CNA and he has been helping her.  She would benefit from toilet aide and also tub seat as well as 3:1 commode.   Handout given and all precautions reviewed     Vision                     Perception     Praxis      Pertinent Vitals/Pain Pain Assessment: 0-10 Pain Score: 2  Pain Location: back Pain Descriptors / Indicators: Sore (also itchy) Pain Intervention(s): Limited activity within patient's tolerance;Monitored during session;Repositioned     Hand Dominance     Extremity/Trunk Assessment Upper Extremity Assessment Upper Extremity Assessment: Overall WFL for tasks assessed           Communication Communication Communication: No difficulties   Cognition Arousal/Alertness: Awake/alert Behavior During Therapy: WFL for tasks assessed/performed Overall Cognitive Status: Within Functional Limits for tasks assessed (needs reinforcement with back precautions)                     General Comments       Exercises       Shoulder Instructions      Home Living Family/patient expects to be discharged to:: Private residence Living Arrangements: Spouse/significant other           Home Layout: One level     Bathroom Shower/Tub: Teacher, early years/pre: Standard  Prior Functioning/Environment Level of Independence: Needs assistance        Comments: husband assists with LB adls, helping her in shower    OT Diagnosis: Generalized weakness;Acute pain   OT Problem List: Decreased activity tolerance;Impaired balance (sitting and/or standing);Decreased knowledge of use of DME or AE;Decreased knowledge of precautions;Pain   OT Treatment/Interventions: Self-care/ADL training;DME and/or AE instruction;Patient/family education;Balance training    OT Goals(Current goals can be found in the care plan section) Acute Rehab OT Goals Patient Stated Goal: heal, stop itching OT Goal Formulation: With patient Time For Goal Achievement: 10/20/14 Potential to Achieve Goals: Good ADL Goals Pt Will Transfer to Toilet: with min guard  assist;ambulating;bedside commode Additional ADL Goal #1: husband will verbalize understanding of back precautions and DME/AE available Additional ADL Goal #2: Pt will verbalize 3/3 back precautions  OT Frequency: Min 2X/week   Barriers to D/C:            Co-evaluation              End of Session    Activity Tolerance: Patient tolerated treatment well Patient left: in bed;with call bell/phone within reach;with family/visitor present   Time: 9563-8756 OT Time Calculation (min): 36 min Charges:  OT General Charges $OT Visit: 1 Procedure OT Evaluation $Initial OT Evaluation Tier I: 1 Procedure OT Treatments $Self Care/Home Management : 23-37 mins G-Codes: OT G-codes **NOT FOR INPATIENT CLASS** Functional Assessment Tool Used: clinical observation Functional Limitation: Self care Self Care Current Status (E3329): At least 60 percent but less than 80 percent impaired, limited or restricted Self Care Goal Status (J1884): At least 60 percent but less than 80 percent impaired, limited or restricted Self Care Discharge Status [phone]): At least 60 percent but less than 80 percent impaired, limited or restricted  [facility] 10/13/2014, 8:46 AM  Lesle Chris, OTR/L [phone] 10/13/2014

## 2014-10-13 NOTE — Progress Notes (Signed)
Pt constantly fidgeting and sitting straight up in bed. Pt educated and reminded of back precautions, but continues to be noncompliant. Will continue to monitor pt and remind her of back precautions.

## 2014-10-13 NOTE — Discharge Instructions (Signed)
Follow back precautions as discussed with PT For the first few days, remove your dressing, tape a piece of saran wrap over your incision before taking your shower. After showering, remove the saran wrap and put a clean dressing on. After three days you can shower without the saran wrap.  Shower only, no tub bath. Call if any temperatures greater than 101 or any wound complications: [phone] during the day and ask for Dr. Charlestine Night nurse, Brunilda Payor.

## 2014-10-13 NOTE — Op Note (Signed)
NAMEMELBA, [name]                    ACCOUNT NO.:  [id number]  MEDICAL RECORD NO.:  [id number]  LOCATION:  4782                         FACILITY:  [facility]  PHYSICIAN:  Kipp Brood. Hallelujah Wysong, M.D.DATE OF BIRTH:  10-01-1946  DATE OF PROCEDURE:  10/12/2014 DATE OF DISCHARGE:                              OPERATIVE REPORT   PREOPERATIVE DIAGNOSES: 1. Severe lateral recess stenosis at L5-S1 secondary to collapsed disk     space at L5-S1. 2. Partial footdrop on the right. 3. Herniated lumbar disk at L5-S1 on the right.  All her symptoms were     on the right.  She had no left leg pain.  SURGEON:  Kipp Brood. Gladstone Lighter, MD  ASSISTANT:  Tarri Glenn, M.D.  DESCRIPTION OF PROCEDURE:  Under general anesthesia, routine orthopedic prep and draping of the back was carried out.  I first did a time-out, also marked the appropriate right side of the back in the holding area. At this time, 2 needles were placed in the back for localization purposes.  X-ray was taken.  An incision then was made over the L5-S1 interspace.  Bleeders identified and cauterized.  I separated the muscle from the lamina bilaterally and inserted the Kocher clamp and another [place], re-identified the space.  Following that, we inserted our [place] retractors.  We stripped the muscle, cleaned from the lamina on the right side.  We identified the L5-S1 interspace; and following that, a hemilaminectomy was carried out for the L5 lamina and the sacrum.  We did a nice wide decompression, there was severe lateral recess stenosis.  We took a great deal of time by using a microscope here to decompress the recess and also did a nice foraminotomy distally and proximally, so we could totally free up the root.  Once we identified the root, there was a significant amount of lateral recess.  Veins were cauterized.  The dura was kept out of harm's way at that time.  We then identified the disk space.  The needle was placed just  around the disk area and an x-ray was taken.  At this time, we then inserted a [place] retractor.  With microscope was being used, we identified the disk a little more proximal where the needle was and went down made a cruciate incision.  There was definite disk material, was subligamentous up under the S1 root as it showed on the MRI.  We went medially, proximally, and distally as well and laterally until we completed diskectomy.  The space was extremely closed that was extremely narrowed.  Thoroughly irrigated out the area, and I went back with a nerve hook, again reexplored the area distally, proximally, medially, and laterally as well.  The nerve root down the dura was free.  I was able easily pass a hockey-stick out of foramen of the L5 root and down the foramen of the S1 root.  Now, the dura and nerve roots were free. We then irrigated the area out.  I loosely applied some thrombin-soaked Gelfoam and closed the wound in layers in usual fashion.  At the beginning of the case, I injected 20 mL of 0.5% Marcaine with epinephrine  into the soft tissue to prevent bleeding.  At the end of of the case, I injected 20 mL of Exparel into the muscle and subcutaneous.  The deep part of the wound was closed in usual fashion except I left a small distal and proximal deep part open for drainage purposes.  Subcutaneous was closed with 0 Vicryl and skin with metal staples and a sterile Neosporin dressing was applied.  Preop, she had 2 g of IV Ancef.          ______________________________ Kipp Brood. Gladstone Lighter, M.D.     RAG/MEDQ  D:  10/12/2014  T:  10/13/2014  Job:  [id number]

## 2014-10-18 NOTE — Discharge Summary (Signed)
Physician Discharge Summary   Patient ID: [name] MRN: [id number] DOB/AGE: 1946-06-03 68 y.o.  Admit date: 10/12/2014 Discharge date: 10/13/2014  Primary Diagnosis: Lumbar disc herniation   Admission Diagnoses:  Past Medical History  Diagnosis Date  . Asthma   . Renal cell carcinoma 2012    left  . Seasonal allergies   . Hypercholesteremia     under control  . GERD (gastroesophageal reflux disease)     occasional  . Arthritis     back-severe, hips, right knee  . H/O mumps   . H/O measles   . Insomnia    Discharge Diagnoses:   Active Problems:   Herniated lumbar intervertebral disc  Estimated body mass index is 21.14 kg/(m^2) as calculated from the following:   Height as of this encounter: 5\' 8"  (1.727 m).   Weight as of this encounter: 63.05 kg (139 lb).  Procedure:  Procedure(s) (LRB): HEMI LAMINECTOMY MICRODISCECTOMY L5-S1 RIGHT (1 LEVEL) (Right)   Consults: None  HPI: The patient is a 68 year old female who presents with the chief complaint of low back pain. The patient reports low back symptoms including low back pain which began over a year ago without any known injury. Symptoms include pain, numbness (bilateral feet) and tingling (bilateral legs). The pain radiates to the left buttock, left thigh, left lower leg, left foot, right buttock, right thigh, right lower leg and right foot. The patient describes the pain as dull, aching and tingling. The patient describes the severity of their symptoms as moderate in severity. Symptoms are exacerbated by standing, sitting and lifting. MRI of the lumbar spine showed degenerative changes at multiple levels. CT myelogram showed a herniated disc at L5-S1 on the right.   Laboratory Data: Hospital Outpatient Visit on 10/04/2014  Component Date Value Ref Range Status  . MRSA, PCR 10/04/2014 NEGATIVE  NEGATIVE Final  . Staphylococcus aureus 10/04/2014 NEGATIVE  NEGATIVE Final   Comment:        The Xpert SA Assay  (FDA approved for NASAL specimens in patients over 66 years of age), is one component of a comprehensive surveillance program.  Test performance has been validated by 36 for patients greater than or equal to 36 year old. It is not intended to diagnose infection nor to guide or monitor treatment.   2 aPTT 10/04/2014 39* 24 - 37 seconds Final   Comment:        IF BASELINE aPTT IS ELEVATED, SUGGEST PATIENT RISK ASSESSMENT BE USED TO DETERMINE APPROPRIATE ANTICOAGULANT THERAPY.   . WBC 10/04/2014 9.9  4.0 - 10.5 K/uL Final  . RBC 10/04/2014 3.54* 3.87 - 5.11 MIL/uL Final  . Hemoglobin 10/04/2014 11.7* 12.0 - 15.0 g/dL Final  . HCT 10/06/2014 35.0* 36.0 - 46.0 % Final  . MCV 10/04/2014 98.9  78.0 - 100.0 fL Final  . MCH 10/04/2014 33.1  26.0 - 34.0 pg Final  . MCHC 10/04/2014 33.4  30.0 - 36.0 g/dL Final  . RDW 10/06/2014 17.0* 11.5 - 15.5 % Final  . Platelets 10/04/2014 535* 150 - 400 K/uL Final  . Neutrophils Relative % 10/04/2014 53  43 - 77 % Final  . Neutro Abs 10/04/2014 5.3  1.7 - 7.7 K/uL Final  . Lymphocytes Relative 10/04/2014 33  12 - 46 % Final  . Lymphs Abs 10/04/2014 3.3  0.7 - 4.0 K/uL Final  . Monocytes Relative 10/04/2014 5  3 - 12 % Final  . Monocytes Absolute 10/04/2014 0.5  0.1 - 1.0  K/uL Final  . Eosinophils Relative 10/04/2014 8* 0 - 5 % Final  . Eosinophils Absolute 10/04/2014 0.7  0.0 - 0.7 K/uL Final  . Basophils Relative 10/04/2014 1  0 - 1 % Final  . Basophils Absolute 10/04/2014 0.1  0.0 - 0.1 K/uL Final  . Sodium 10/04/2014 137  137 - 147 mEq/L Final  . Potassium 10/04/2014 4.8  3.7 - 5.3 mEq/L Final  . Chloride 10/04/2014 97  96 - 112 mEq/L Final  . CO2 10/04/2014 26  19 - 32 mEq/L Final  . Glucose, Bld 10/04/2014 93  70 - 99 mg/dL Final  . BUN 10/04/2014 17  6 - 23 mg/dL Final  . Creatinine, Ser 10/04/2014 0.79  0.50 - 1.10 mg/dL Final  . Calcium 10/04/2014 9.6  8.4 - 10.5 mg/dL Final  . Total Protein 10/04/2014 8.2  6.0 - 8.3 g/dL  Final  . Albumin 10/04/2014 3.6  3.5 - 5.2 g/dL Final  . AST 10/04/2014 149* 0 - 37 U/L Final  . ALT 10/04/2014 47* 0 - 35 U/L Final  . Alkaline Phosphatase 10/04/2014 331* 39 - 117 U/L Final  . Total Bilirubin 10/04/2014 0.3  0.3 - 1.2 mg/dL Final  . GFR calc non Af Amer 10/04/2014 85* >90 mL/min Final  . GFR calc Af Amer 10/04/2014 >90  >90 mL/min Final   Comment: (NOTE) The eGFR has been calculated using the CKD EPI equation. This calculation has not been validated in all clinical situations. eGFR's persistently <90 mL/min signify possible Chronic Kidney Disease.   . Anion gap 10/04/2014 14  5 - 15 Final  . Prothrombin Time 10/04/2014 15.0  11.6 - 15.2 seconds Final  . INR 10/04/2014 1.17  0.00 - 1.49 Final  . Color, Urine 10/04/2014 Everard Interrante* YELLOW Final   BIOCHEMICALS MAY BE AFFECTED BY COLOR  . APPearance 10/04/2014 CLOUDY* CLEAR Final  . Specific Gravity, Urine 10/04/2014 1.018  1.005 - 1.030 Final  . pH 10/04/2014 5.5  5.0 - 8.0 Final  . Glucose, UA 10/04/2014 NEGATIVE  NEGATIVE mg/dL Final  . Hgb urine dipstick 10/04/2014 NEGATIVE  NEGATIVE Final  . Bilirubin Urine 10/04/2014 NEGATIVE  NEGATIVE Final  . Ketones, ur 10/04/2014 NEGATIVE  NEGATIVE mg/dL Final  . Protein, ur 10/04/2014 NEGATIVE  NEGATIVE mg/dL Final  . Urobilinogen, UA 10/04/2014 0.2  0.0 - 1.0 mg/dL Final  . Nitrite 10/04/2014 NEGATIVE  NEGATIVE Final  . Leukocytes, UA 10/04/2014 NEGATIVE  NEGATIVE Final   MICROSCOPIC NOT DONE ON URINES WITH NEGATIVE PROTEIN, BLOOD, LEUKOCYTES, NITRITE, OR GLUCOSE <1000 mg/dL.  . ABO/RH(D) 10/04/2014 O POS   Final  . Antibody Screen 10/04/2014 NEG   Final  . Sample Expiration 10/04/2014 10/15/2014   Final     X-Rays:Dg Lumbar Spine 2-3 Views  10/04/2014   CLINICAL DATA:  Back surgery.  Pain.  EXAM: LUMBAR SPINE - 2-3 VIEW  COMPARISON:  CT 09/19/2014.  FINDINGS: Prominent degenerative changes again noted at L3-L4, L4-5, L5-S1. No acute bony abnormality identified. Normal  alignment. Aortoiliac atherosclerotic vascular disease. Surgical clips upper abdomen. Pelvic phleboliths.  IMPRESSION: Severe degenerative changes lumbar spine L3-L4, L4-L5, and L5-S1 again noted. Normal alignment.   Electronically Signed   By: Marcello Moores  Register   On: 10/04/2014 16:05   Ct Lumbar Spine W Contrast  09/19/2014   CLINICAL DATA:  Lumbar degenerative disc disease. Low back and right lower extremity pain.  EXAM: LUMBAR MYELOGRAM  FLUOROSCOPY TIME:  0 min 51 seconds  PROCEDURE: After thorough discussion of risks and  benefits of the procedure including bleeding, infection, injury to nerves, blood vessels, adjacent structures as well as headache and CSF leak, written and oral informed consent was obtained. Consent was obtained by Dr. Logan Bores. Time out form was completed.  Patient was positioned prone on the fluoroscopy table. Local anesthesia was provided with 1% lidocaine without epinephrine after prepped and draped in the usual sterile fashion. Puncture was performed at L5-S1 using a 3 1/2 inch 22-gauge spinal needle via a right paramedian approach. Using a single pass through the dura, the needle was placed within the thecal sac, with return of clear CSF. 15 mL of Omnipaque-180 was injected into the thecal sac, with normal opacification of the nerve roots and cauda equina consistent with free flow within the subarachnoid space.  I personally performed the lumbar puncture and administered the intrathecal contrast. I also personally supervised acquisition of the myelogram images.  TECHNIQUE: Contiguous axial images were obtained through the Lumbar spine after the intrathecal infusion of contrast. Coronal and sagittal reconstructions were obtained of the axial image sets.  COMPARISON:  Lumbar spine MRI from [facility] 01/01/2014. CT abdomen and pelvis 09/10/2013.  FINDINGS: LUMBAR MYELOGRAM FINDINGS:  There is slight left convex curvature of the lower lumbar spine with apex at L4.  There is mild straightening of the normal lumbar lordosis. No significant listhesis is identified. Small ventral epidural defects are present at L2-3, L3-4, and L4-5 without evidence of spinal stenosis. There is right greater than left lateral recess stenosis at L5-S1. Severe disc space narrowing is present at L3-4, L4-5, and L5-S1.  CT LUMBAR MYELOGRAM FINDINGS:  There is slight left convex curvature of the lower lumbar spine with apex at L4. There is slight right lateral subluxation of L3 on L4 and slight left lateral subluxation of L4 on L5. Again seen is severe disc space narrowing at L3-4 and L4-5 with vacuum disc phenomenon, sclerosis, and multiple small Schmorl's nodes/ cystic changes about the endplates. There is also severe disc space narrowing at L5-S1 which has progressed from the prior MRI and CT. There are multiple large endplate erosions/ Schmorl's node deformities/cystic changes on both sides of the disc space with surrounding sclerosis, essentially new from the prior CT. Scattered locules of gas are present in the disc space. The conus medullaris terminates at the superior aspect of L2. Mild to moderate aortoiliac atherosclerotic calcification is noted.  L1-2:  Negative.  L2-3: Prominent circumferential disc bulging results in mild right greater than left neural foraminal stenosis without spinal canal stenosis.  L3-4: Circumferential disc bulging, endplate spurring, and mild facet hypertrophy result in moderate left neural foraminal stenosis, similar to prior MRI. No spinal canal stenosis.  L4-5: Disc bulging, endplate spurring, and moderate right and mild left facet arthrosis result in mild right neural foraminal stenosis, similar to prior MRI. No spinal canal stenosis.  L5-S1: There is circumferential disc bulging with a new, large right central/ subarticular disc extrusion with caudal migration to just below the S1 pedicle level. This results in severe right lateral recess stenosis affecting the  S1 nerve root. The right S2 nerve root is also displaced posteromedially in the thecal sac. There are locules of gas in the disc extrusion. There is mild eccentric spinal canal stenosis. There is also mild left lateral recess stenosis and mild bilateral neural foraminal stenosis, mildly increased from prior.  IMPRESSION: 1. Marked interval progression of changes about the L5-S1 disc space as above with a new, large right central disc extrusion affecting  the right S1 nerve root in the lateral recess. Endplate changes are favored to reflect accelerated degenerative disc disease although other etiologies such as gout and amyloid are not excluded. 2. Advanced disc degeneration at L3-4 and L4-5 without spinal stenosis, similar to the prior MRI. Moderate left neural foraminal stenosis at L3-4.   Electronically Signed   By: Sebastian Ache   On: 09/19/2014 15:37   Dg Spine Portable 1 View  10/12/2014   CLINICAL DATA:  L5-S1 microdiskectomy  EXAM: PORTABLE SPINE - 1 VIEW  COMPARISON:  Study obtained earlier in the day  FINDINGS: Cross-table lateral lumbar spine image obtained. Metallic probe tip is posterior to the posterior aspect of the S1 vertebral body. No fracture or spondylolisthesis. There is moderate generalized osteoarthritic change.  IMPRESSION: Metallic probe tip posterior to the posterior aspect of the L1 vertebral body, slightly inferior to the L5-S1 interspace level. Generalized osteoarthritic change.   Electronically Signed   By: Bretta Bang M.D.   On: 10/12/2014 16:10   Dg Spine Portable 1 View  10/12/2014   CLINICAL DATA:  For lumbar spine surgery  EXAM: PORTABLE SPINE - 1 VIEW  COMPARISON:  Lumbar spine films of 10/04/2014  FINDINGS: A crosstable lateral portable lumbar spine image #2 shows a clamp on the spinous process posteriorly of L5 with an instrument posteriorly directed toward the posterior body of S1.  IMPRESSION: Clamp on spinous process of L5 with instrument directed toward the  posterior body of S1.   Electronically Signed   By: Dwyane Dee M.D.   On: 10/12/2014 15:39   Dg Spine Portable 1 View  10/12/2014   CLINICAL DATA:  L5-S1 microdiskectomy  EXAM: PORTABLE SPINE - 1 VIEW  COMPARISON:  October 04, 2014  FINDINGS: Cross-table lateral image was obtained. There are metallic probe tips posterior to the inferior aspect of S1 and the mid to inferior aspect of L5. There is moderate disc space narrowing at L3-4, L4-5, and L5-S1. No fracture or spondylolisthesis.  IMPRESSION: Probes tips are posterior to the L5 and S1 vertebral body levels. Multilevel osteoarthritic change.   Electronically Signed   By: Bretta Bang M.D.   On: 10/12/2014 15:39   Dg Myelography Lumbar Inj Lumbosacral  09/19/2014   CLINICAL DATA:  Lumbar degenerative disc disease. Low back and right lower extremity pain.  EXAM: LUMBAR MYELOGRAM  FLUOROSCOPY TIME:  0 min 51 seconds  PROCEDURE: After thorough discussion of risks and benefits of the procedure including bleeding, infection, injury to nerves, blood vessels, adjacent structures as well as headache and CSF leak, written and oral informed consent was obtained. Consent was obtained by Dr. Sebastian Ache. Time out form was completed.  Patient was positioned prone on the fluoroscopy table. Local anesthesia was provided with 1% lidocaine without epinephrine after prepped and draped in the usual sterile fashion. Puncture was performed at L5-S1 using a 3 1/2 inch 22-gauge spinal needle via a right paramedian approach. Using a single pass through the dura, the needle was placed within the thecal sac, with return of clear CSF. 15 mL of Omnipaque-180 was injected into the thecal sac, with normal opacification of the nerve roots and cauda equina consistent with free flow within the subarachnoid space.  I personally performed the lumbar puncture and administered the intrathecal contrast. I also personally supervised acquisition of the myelogram images.  TECHNIQUE:  Contiguous axial images were obtained through the Lumbar spine after the intrathecal infusion of contrast. Coronal and sagittal reconstructions were obtained of the  axial image sets.  COMPARISON:  Lumbar spine MRI from [facility] 01/01/2014. CT abdomen and pelvis 09/10/2013.  FINDINGS: LUMBAR MYELOGRAM FINDINGS:  There is slight left convex curvature of the lower lumbar spine with apex at L4. There is mild straightening of the normal lumbar lordosis. No significant listhesis is identified. Small ventral epidural defects are present at L2-3, L3-4, and L4-5 without evidence of spinal stenosis. There is right greater than left lateral recess stenosis at L5-S1. Severe disc space narrowing is present at L3-4, L4-5, and L5-S1.  CT LUMBAR MYELOGRAM FINDINGS:  There is slight left convex curvature of the lower lumbar spine with apex at L4. There is slight right lateral subluxation of L3 on L4 and slight left lateral subluxation of L4 on L5. Again seen is severe disc space narrowing at L3-4 and L4-5 with vacuum disc phenomenon, sclerosis, and multiple small Schmorl's nodes/ cystic changes about the endplates. There is also severe disc space narrowing at L5-S1 which has progressed from the prior MRI and CT. There are multiple large endplate erosions/ Schmorl's node deformities/cystic changes on both sides of the disc space with surrounding sclerosis, essentially new from the prior CT. Scattered locules of gas are present in the disc space. The conus medullaris terminates at the superior aspect of L2. Mild to moderate aortoiliac atherosclerotic calcification is noted.  L1-2:  Negative.  L2-3: Prominent circumferential disc bulging results in mild right greater than left neural foraminal stenosis without spinal canal stenosis.  L3-4: Circumferential disc bulging, endplate spurring, and mild facet hypertrophy result in moderate left neural foraminal stenosis, similar to prior MRI. No spinal canal stenosis.   L4-5: Disc bulging, endplate spurring, and moderate right and mild left facet arthrosis result in mild right neural foraminal stenosis, similar to prior MRI. No spinal canal stenosis.  L5-S1: There is circumferential disc bulging with a new, large right central/ subarticular disc extrusion with caudal migration to just below the S1 pedicle level. This results in severe right lateral recess stenosis affecting the S1 nerve root. The right S2 nerve root is also displaced posteromedially in the thecal sac. There are locules of gas in the disc extrusion. There is mild eccentric spinal canal stenosis. There is also mild left lateral recess stenosis and mild bilateral neural foraminal stenosis, mildly increased from prior.  IMPRESSION: 1. Marked interval progression of changes about the L5-S1 disc space as above with a new, large right central disc extrusion affecting the right S1 nerve root in the lateral recess. Endplate changes are favored to reflect accelerated degenerative disc disease although other etiologies such as gout and amyloid are not excluded. 2. Advanced disc degeneration at L3-4 and L4-5 without spinal stenosis, similar to the prior MRI. Moderate left neural foraminal stenosis at L3-4.   Electronically Signed   By: Logan Bores   On: 09/19/2014 15:37    EKG: Orders placed or performed during the hospital encounter of 02/28/14  . EKG 12-Lead  . EKG 12-Lead  . EKG 12-Lead  . EKG 12-Lead  . EKG  . EKG 12-Lead  . EKG 12-Lead     Hospital Course: ARIENNE GARTIN is a 68 y.o. who was admitted to [facility]. They were brought to the operating room on 10/12/2014 and underwent Procedure(s): HEMI LAMINECTOMY MICRODISCECTOMY L5-S1 RIGHT (1 LEVEL).  Patient tolerated the procedure well and was later transferred to the recovery room and then to the orthopaedic floor for postoperative care.  They were given PO and IV analgesics for  pain control following their surgery.  They were given 24 hours of  postoperative antibiotics of  Anti-infectives    Start     Dose/Rate Route Frequency Ordered Stop   10/13/14 0600  ceFAZolin (ANCEF) IVPB 2 g/50 mL premix     2 g100 mL/hr over 30 Minutes Intravenous On call to O.R. 10/12/14 1217 10/12/14 1503   10/12/14 2200  ceFAZolin (ANCEF) IVPB 1 g/50 mL premix     1 g100 mL/hr over 30 Minutes Intravenous 3 times per day 10/12/14 1746 10/13/14 1514   10/12/14 1553  polymyxin B 500,000 Units, bacitracin 50,000 Units in sodium chloride irrigation 0.9 % 500 mL irrigation  Status:  Discontinued       As needed 10/12/14 1554 10/12/14 1640     and started on DVT prophylaxis in the form of Aspirin.   PT was ordered. Discharge planning consulted to help with postop disposition and equipment needs.  Patient had a decent night on the evening of surgery.  They started to get up OOB with therapy on day one. Patient was seen in rounds and was ready to go home.   Diet: Cardiac diet Activity:WBAT Follow-up:in 2 weeks Disposition - Home Discharged Condition: stable   Discharge Instructions    Call MD / Call 911    Complete by:  As directed   If you experience chest pain or shortness of breath, CALL 911 and be transported to the hospital emergency room.  If you develope a fever above 101 F, pus (white drainage) or increased drainage or redness at the wound, or calf pain, call your surgeon's office.     Constipation Prevention    Complete by:  As directed   Drink plenty of fluids.  Prune juice may be helpful.  You may use a stool softener, such as Colace (over the counter) 100 mg twice a day.  Use MiraLax (over the counter) for constipation as needed.     Diet - low sodium heart healthy    Complete by:  As directed      Discharge instructions    Complete by:  As directed   Follow back precautions as discussed with PT For the first few days, remove your dressing, tape a piece of saran wrap over your incision before taking your shower. After showering, remove the  saran wrap and put a clean dressing on. After three days you can shower without the saran wrap.  Shower only, no tub bath. Call if any temperatures greater than 101 or any wound complications: [phone] during the day and ask for Dr. Charlestine Night nurse, Brunilda Payor.     Driving restrictions    Complete by:  As directed   No driving while taking pain medications     Increase activity slowly as tolerated    Complete by:  As directed      Lifting restrictions    Complete by:  As directed   No lifting            Medication List    STOP taking these medications        cyclobenzaprine 5 MG tablet  Commonly known as:  FLEXERIL     traMADol 50 MG tablet  Commonly known as:  ULTRAM      TAKE these medications        albuterol 108 (90 BASE) MCG/ACT inhaler  Commonly known as:  PROVENTIL HFA;VENTOLIN HFA  Inhale 2 puffs into the lungs every 6 (six) hours as needed for wheezing or  shortness of breath.     calcium-vitamin D 500-200 MG-UNIT per tablet  Commonly known as:  OSCAL WITH D  Take 2 tablets by mouth 2 (two) times daily.     cholecalciferol 1000 UNITS tablet  Commonly known as:  VITAMIN D  Take 1,000 Units by mouth every morning.     citalopram 10 MG tablet  Commonly known as:  CELEXA  Take 10 mg by mouth every morning.     dicyclomine 10 MG capsule  Commonly known as:  BENTYL  Take 10 mg by mouth 4 (four) times daily -  before meals and at bedtime.     diphenhydrAMINE 25 MG tablet  Commonly known as:  BENADRYL  Take 25 mg by mouth every 6 (six) hours as needed for allergies.     famotidine 20 MG tablet  Commonly known as:  PEPCID  Take 20 mg by mouth 2 (two) times daily.     Fluticasone-Salmeterol 500-50 MCG/DOSE Aepb  Commonly known as:  ADVAIR  Inhale 1 puff into the lungs 2 (two) times daily.     methocarbamol 500 MG tablet  Commonly known as:  ROBAXIN  Take 1 tablet (500 mg total) by mouth every 6 (six) hours as needed for muscle spasms.     montelukast  10 MG tablet  Commonly known as:  SINGULAIR  Take 10 mg by mouth every morning.     mupirocin cream 2 %  Commonly known as:  BACTROBAN  Apply 1 application topically 2 (two) times daily as needed (rash).     oxyCODONE-acetaminophen 5-325 MG per tablet  Commonly known as:  PERCOCET/ROXICET  Take 1-2 tablets by mouth every 4 (four) hours as needed for moderate pain.     simvastatin 40 MG tablet  Commonly known as:  ZOCOR  Take 40 mg by mouth every morning.     VITAMIN B-12 PO  Take 1 tablet by mouth every morning.     vitamin C 500 MG tablet  Commonly known as:  ASCORBIC ACID  Take 1 mg by mouth every morning.     vitamin E 400 UNIT capsule  Take 400 Units by mouth every morning.           Follow-up Information    Follow up with GIOFFRE,RONALD A, MD. Schedule an appointment as soon as possible for a visit in 2 weeks.   Specialty:  Orthopedic Surgery   Contact information:   [address] [place] [address] [phone]       Signed: Ardeen Jourdain, PA-C Orthopaedic Surgery 10/18/2014, 8:22 AM

## 2014-10-20 ENCOUNTER — Emergency Department (HOSPITAL_COMMUNITY)
Admission: EM | Admit: 2014-10-20 | Discharge: 2014-10-20 | Disposition: A | Discharge status: Home / Self Care | Payer: Medicare HMO | Attending: Emergency Medicine | Admitting: Emergency Medicine

## 2014-10-20 ENCOUNTER — Encounter (HOSPITAL_COMMUNITY): Payer: Self-pay | Admitting: *Deleted

## 2014-10-20 DIAGNOSIS — R112 Nausea with vomiting, unspecified: Secondary | ICD-10-CM | POA: Diagnosis not present

## 2014-10-20 DIAGNOSIS — Z85528 Personal history of other malignant neoplasm of kidney: Secondary | ICD-10-CM | POA: Insufficient documentation

## 2014-10-20 DIAGNOSIS — Z79899 Other long term (current) drug therapy: Secondary | ICD-10-CM | POA: Diagnosis not present

## 2014-10-20 DIAGNOSIS — J45909 Unspecified asthma, uncomplicated: Secondary | ICD-10-CM | POA: Insufficient documentation

## 2014-10-20 DIAGNOSIS — K219 Gastro-esophageal reflux disease without esophagitis: Secondary | ICD-10-CM | POA: Diagnosis not present

## 2014-10-20 DIAGNOSIS — R1013 Epigastric pain: Secondary | ICD-10-CM | POA: Diagnosis not present

## 2014-10-20 DIAGNOSIS — Z7952 Long term (current) use of systemic steroids: Secondary | ICD-10-CM | POA: Insufficient documentation

## 2014-10-20 DIAGNOSIS — Z9089 Acquired absence of other organs: Secondary | ICD-10-CM | POA: Insufficient documentation

## 2014-10-20 DIAGNOSIS — R109 Unspecified abdominal pain: Secondary | ICD-10-CM | POA: Diagnosis present

## 2014-10-20 DIAGNOSIS — Z8669 Personal history of other diseases of the nervous system and sense organs: Secondary | ICD-10-CM | POA: Insufficient documentation

## 2014-10-20 DIAGNOSIS — E782 Mixed hyperlipidemia: Secondary | ICD-10-CM | POA: Diagnosis not present

## 2014-10-20 DIAGNOSIS — Z720 Tobacco use: Secondary | ICD-10-CM | POA: Insufficient documentation

## 2014-10-20 DIAGNOSIS — Z9889 Other specified postprocedural states: Secondary | ICD-10-CM | POA: Diagnosis not present

## 2014-10-20 DIAGNOSIS — M1990 Unspecified osteoarthritis, unspecified site: Secondary | ICD-10-CM | POA: Diagnosis not present

## 2014-10-20 LAB — CBC WITH DIFFERENTIAL/PLATELET
BASOS ABS: 0 10*3/uL (ref 0.0–0.1)
Basophils Relative: 1 % (ref 0–1)
Eosinophils Absolute: 0 10*3/uL (ref 0.0–0.7)
Eosinophils Relative: 0 % (ref 0–5)
HEMATOCRIT: 31 % — AB (ref 36.0–46.0)
HEMOGLOBIN: 10.6 g/dL — AB (ref 12.0–15.0)
Lymphocytes Relative: 33 % (ref 12–46)
Lymphs Abs: 1.7 10*3/uL (ref 0.7–4.0)
MCH: 32.5 pg (ref 26.0–34.0)
MCHC: 34.2 g/dL (ref 30.0–36.0)
MCV: 95.1 fL (ref 78.0–100.0)
MONO ABS: 0.2 10*3/uL (ref 0.1–1.0)
MONOS PCT: 4 % (ref 3–12)
NEUTROS ABS: 3.2 10*3/uL (ref 1.7–7.7)
Neutrophils Relative %: 62 % (ref 43–77)
Platelets: 650 10*3/uL — ABNORMAL HIGH (ref 150–400)
RBC: 3.26 MIL/uL — ABNORMAL LOW (ref 3.87–5.11)
RDW: 17.8 % — ABNORMAL HIGH (ref 11.5–15.5)
WBC: 5.2 10*3/uL (ref 4.0–10.5)

## 2014-10-20 LAB — COMPREHENSIVE METABOLIC PANEL
ALBUMIN: 3.1 g/dL — AB (ref 3.5–5.2)
ALK PHOS: 355 U/L — AB (ref 39–117)
ALT: 18 U/L (ref 0–35)
AST: 39 U/L — AB (ref 0–37)
Anion gap: 18 — ABNORMAL HIGH (ref 5–15)
BILIRUBIN TOTAL: 0.3 mg/dL (ref 0.3–1.2)
BUN: 6 mg/dL (ref 6–23)
CHLORIDE: 98 meq/L (ref 96–112)
CO2: 26 mEq/L (ref 19–32)
CREATININE: 0.47 mg/dL — AB (ref 0.50–1.10)
Calcium: 9.5 mg/dL (ref 8.4–10.5)
GFR calc Af Amer: >90 mL/min (ref >90)
GFR calc non Af Amer: >90 mL/min (ref >90)
Glucose, Bld: 113 mg/dL — ABNORMAL HIGH (ref 70–99)
POTASSIUM: 3.8 meq/L (ref 3.7–5.3)
Sodium: 142 mEq/L (ref 137–147)
Total Protein: 7.7 g/dL (ref 6.0–8.3)

## 2014-10-20 LAB — URINALYSIS, ROUTINE W REFLEX MICROSCOPIC
BILIRUBIN URINE: NEGATIVE
Glucose, UA: NEGATIVE mg/dL
HGB URINE DIPSTICK: NEGATIVE
KETONES UR: NEGATIVE mg/dL
Leukocytes, UA: NEGATIVE
Nitrite: NEGATIVE
PH: 7.5 (ref 5.0–8.0)
Protein, ur: NEGATIVE mg/dL
SPECIFIC GRAVITY, URINE: 1.013 (ref 1.005–1.030)
Urobilinogen, UA: 0.2 mg/dL (ref 0.0–1.0)

## 2014-10-20 LAB — LIPASE, BLOOD: Lipase: 10 U/L — ABNORMAL LOW (ref 11–59)

## 2014-10-20 MED ORDER — PANTOPRAZOLE SODIUM 20 MG PO TBEC: 20.0000 mg | DELAYED_RELEASE_TABLET | Freq: Every day | ORAL | Status: DC

## 2014-10-20 MED ORDER — FENTANYL CITRATE 0.05 MG/ML IJ SOLN
50.0000 ug | Freq: Once | INTRAMUSCULAR | Status: DC
  Filled 2014-10-20: qty 2

## 2014-10-20 MED ORDER — ONDANSETRON HCL 4 MG/2ML IJ SOLN
4.0000 mg | Freq: Once | INTRAMUSCULAR | Status: AC
Start: 2014-10-20 — End: 2014-10-20
  Administered 2014-10-20: 4 mg via INTRAVENOUS
  Filled 2014-10-20: qty 2

## 2014-10-20 MED ORDER — FENTANYL CITRATE 0.05 MG/ML IJ SOLN
100.0000 ug | Freq: Once | INTRAMUSCULAR | Status: AC
  Administered 2014-10-20: 100 ug via INTRAVENOUS

## 2014-10-20 NOTE — ED Provider Notes (Signed)
CSN: [id number]     Arrival date & time 10/20/14  0048 History   First MD Initiated Contact with Patient 10/20/14 0207     Chief Complaint  Patient presents with  . Abdominal Pain     (Consider location/radiation/quality/duration/timing/severity/associated sxs/prior Treatment) Patient is a 68 y.o. female presenting with abdominal pain. The history is provided by the patient and the spouse. No language interpreter was used.  Abdominal Pain Pain location:  Epigastric Pain quality: aching and cramping   Pain severity:  Moderate Duration:  12 hours Timing:  Constant Associated symptoms: nausea and vomiting   Associated symptoms: no chills and no fever   Associated symptoms comment:  Recurrent epigastric pain starting yesterday afternoon. She reports nausea and vomiting non-bloody emesis. No fever. She reports this is recurrent pain. Her husband feels she has this pain when she has been drinking alcohol, which the patient admits to. She recently had surgery to her lower back. She has no complaints of increased pain in the back.    Past Medical History  Diagnosis Date  . Asthma   . Renal cell carcinoma 2012    left  . Seasonal allergies   . Hypercholesteremia     under control  . GERD (gastroesophageal reflux disease)     occasional  . Arthritis     back-severe, hips, right knee  . H/O mumps   . H/O measles   . Insomnia    Past Surgical History  Procedure Laterality Date  . Kidney surgery  12/2010    [facility]; partial nephrectomy  . Hernia repair  07/6758    supraumbilical repair  . Bunionectomy  04/2011  . Menisectomy  2010    left knee  . Cholecystectomy  11/13/2011    Procedure: LAPAROSCOPIC CHOLECYSTECTOMY WITH INTRAOPERATIVE CHOLANGIOGRAM;  Surgeon: Judieth Keens, DO;  Location: [facility];  Service: General;  Laterality: N/A;  . Esophagogastroduodenoscopy N/A 09/25/2013    Procedure: ESOPHAGOGASTRODUODENOSCOPY (EGD);  Surgeon: Lear Ng, MD;  Location: [facility];  Service: Endoscopy;  Laterality: N/A;  . Colonoscopy N/A 09/25/2013    Procedure: COLONOSCOPY;  Surgeon: Lear Ng, MD;  Location: [facility];  Service: Endoscopy;  Laterality: N/A;  . Abdominal hysterectomy  40years ago  . Tubal ligation  44 years ago  . Lumbar laminectomy/decompression microdiscectomy Right 10/12/2014    Procedure: HEMI LAMINECTOMY MICRODISCECTOMY L5-S1 RIGHT (1 LEVEL);  Surgeon: Tobi Bastos, MD;  Location: [facility];  Service: Orthopedics;  Laterality: Right;   No family history on file. History  Substance Use Topics  . Smoking status: Current Some Day Smoker -- 0.00 packs/day for 6 years    Types: Cigarettes  . Smokeless tobacco: Never Used  . Alcohol Use: Yes     Comment: socially   OB History    No data available     Review of Systems  Constitutional: Negative for fever and chills.  HENT: Negative.   Respiratory: Negative.   Cardiovascular: Negative.   Gastrointestinal: Positive for nausea, vomiting and abdominal pain.  Musculoskeletal: Negative.   Skin: Negative.   Neurological: Negative.       Allergies  Azithromycin  Home Medications   Prior to Admission medications   Medication Sig Start Date End Date Taking? Authorizing Provider  albuterol (PROVENTIL HFA;VENTOLIN HFA) 108 (90 BASE) MCG/ACT inhaler Inhale 2 puffs into the lungs every 6 (six) hours as needed for wheezing or shortness of breath.    Yes Historical Provider, MD  calcium-vitamin D (OSCAL WITH D)  500-200 MG-UNIT per tablet Take 2 tablets by mouth 2 (two) times daily.    Yes Historical Provider, MD  cholecalciferol (VITAMIN D) 1000 UNITS tablet Take 1,000 Units by mouth every morning.   Yes Historical Provider, MD  citalopram (CELEXA) 10 MG tablet Take 10 mg by mouth every morning.   Yes Historical Provider, MD  Cyanocobalamin (VITAMIN B-12 PO) Take 1 tablet by mouth every morning.   Yes Historical Provider, MD  dicyclomine (BENTYL) 10 MG capsule Take 10 mg by  mouth 4 (four) times daily -  before meals and at bedtime.   Yes Historical Provider, MD  diphenhydrAMINE (BENADRYL) 25 MG tablet Take 25 mg by mouth every 6 (six) hours as needed for allergies.    Yes Historical Provider, MD  famotidine (PEPCID) 20 MG tablet Take 20 mg by mouth 2 (two) times daily.   Yes Historical Provider, MD  Fluticasone-Salmeterol (ADVAIR) 500-50 MCG/DOSE AEPB Inhale 1 puff into the lungs 2 (two) times daily.   Yes Historical Provider, MD  methocarbamol (ROBAXIN) 500 MG tablet Take 1 tablet (500 mg total) by mouth every 6 (six) hours as needed for muscle spasms. 10/13/14  Yes Amber Renelda Loma, PA-C  montelukast (SINGULAIR) 10 MG tablet Take 10 mg by mouth every morning.   Yes Historical Provider, MD  mupirocin cream (BACTROBAN) 2 % Apply 1 application topically 2 (two) times daily as needed (rash).   Yes Historical Provider, MD  oxyCODONE-acetaminophen (PERCOCET/ROXICET) 5-325 MG per tablet Take 1-2 tablets by mouth every 4 (four) hours as needed for moderate pain. 10/13/14  Yes Amber Renelda Loma, PA-C  simvastatin (ZOCOR) 40 MG tablet Take 40 mg by mouth every morning.   Yes Historical Provider, MD  vitamin C (ASCORBIC ACID) 500 MG tablet Take 1 mg by mouth every morning.   Yes Historical Provider, MD  vitamin E 400 UNIT capsule Take 400 Units by mouth every morning.   Yes Historical Provider, MD  pantoprazole (PROTONIX) 20 MG tablet Take 1 tablet (20 mg total) by mouth daily. 10/20/14   Jule Schlabach A Shakara Tweedy, PA-C   BP 141/75 mmHg  Pulse 96  Temp(Src) 97.6 F (36.4 C) (Oral)  Resp 16  SpO2 98% Physical Exam  Constitutional: She is oriented to person, place, and time. She appears well-developed and well-nourished.  HENT:  Head: Normocephalic.  Neck: Normal range of motion. Neck supple.  Cardiovascular: Normal rate and regular rhythm.   Pulmonary/Chest: Effort normal and breath sounds normal.  Abdominal: Soft. Bowel sounds are normal. There is no tenderness. There  is no rebound and no guarding.  Musculoskeletal: Normal range of motion.  Neurological: She is alert and oriented to person, place, and time.  Skin: Skin is warm and dry. No rash noted.  Psychiatric: She has a normal mood and affect.    ED Course  Procedures (including critical care time) Labs Review Labs Reviewed  CBC WITH DIFFERENTIAL - Abnormal; Notable for the following:    RBC 3.26 (*)    Hemoglobin 10.6 (*)    HCT 31.0 (*)    RDW 17.8 (*)    Platelets 650 (*)    All other components within normal limits  COMPREHENSIVE METABOLIC PANEL - Abnormal; Notable for the following:    Glucose, Bld 113 (*)    Creatinine, Ser 0.47 (*)    Albumin 3.1 (*)    AST 39 (*)    Alkaline Phosphatase 355 (*)    Anion gap 18 (*)    All other components within  normal limits  LIPASE, BLOOD - Abnormal; Notable for the following:    Lipase 10 (*)    All other components within normal limits  URINALYSIS, ROUTINE W REFLEX MICROSCOPIC    Imaging Review No results found.   EKG Interpretation   Date/Time:  Thursday October 20 2014 01:02:12 EST Ventricular Rate:  94 PR Interval:  123 QRS Duration: 75 QT Interval:  393 QTC Calculation: 491 R Axis:   55 Text Interpretation:  Sinus rhythm Borderline prolonged QT interval No  significant change since last tracing Confirmed by Watkins  ([id number]) on 10/20/2014 1:05:03 AM      MDM   Final diagnoses:  Epigastric pain    She has a completely non-tender abdomen and reports pain is resolved. Labs are stable. She is well appearing with stable VS. Feel discharge home is appropriate with PCP follow up this week for recheck.     Dewaine Oats, PA-C 10/20/14 [place], MD 10/20/14 [phone]

## 2014-10-20 NOTE — ED Notes (Signed)
EKG given to EDP,Doherty,MD., for review.

## 2014-10-20 NOTE — Discharge Instructions (Signed)

## 2014-10-20 NOTE — ED Notes (Signed)
Pt reports abd pain with n/v/d.  Recently had back surgery per pt's spouse.

## 2015-01-27 ENCOUNTER — Encounter (HOSPITAL_COMMUNITY): Payer: Self-pay

## 2015-01-27 ENCOUNTER — Emergency Department (HOSPITAL_COMMUNITY)
Admission: EM | Admit: 2015-01-27 | Discharge: 2015-01-27 | Disposition: A | Discharge status: Home / Self Care | Payer: Medicare HMO | Attending: Emergency Medicine | Admitting: Emergency Medicine

## 2015-01-27 DIAGNOSIS — Z8669 Personal history of other diseases of the nervous system and sense organs: Secondary | ICD-10-CM | POA: Diagnosis not present

## 2015-01-27 DIAGNOSIS — S80862A Insect bite (nonvenomous), left lower leg, initial encounter: Secondary | ICD-10-CM | POA: Insufficient documentation

## 2015-01-27 DIAGNOSIS — Z85528 Personal history of other malignant neoplasm of kidney: Secondary | ICD-10-CM | POA: Insufficient documentation

## 2015-01-27 DIAGNOSIS — K219 Gastro-esophageal reflux disease without esophagitis: Secondary | ICD-10-CM | POA: Diagnosis not present

## 2015-01-27 DIAGNOSIS — Z79899 Other long term (current) drug therapy: Secondary | ICD-10-CM | POA: Diagnosis not present

## 2015-01-27 DIAGNOSIS — M1990 Unspecified osteoarthritis, unspecified site: Secondary | ICD-10-CM | POA: Insufficient documentation

## 2015-01-27 DIAGNOSIS — S80861A Insect bite (nonvenomous), right lower leg, initial encounter: Secondary | ICD-10-CM | POA: Insufficient documentation

## 2015-01-27 DIAGNOSIS — Z7951 Long term (current) use of inhaled steroids: Secondary | ICD-10-CM | POA: Insufficient documentation

## 2015-01-27 DIAGNOSIS — Y9389 Activity, other specified: Secondary | ICD-10-CM | POA: Insufficient documentation

## 2015-01-27 DIAGNOSIS — E780 Pure hypercholesterolemia: Secondary | ICD-10-CM | POA: Diagnosis not present

## 2015-01-27 DIAGNOSIS — Y998 Other external cause status: Secondary | ICD-10-CM | POA: Diagnosis not present

## 2015-01-27 DIAGNOSIS — J45909 Unspecified asthma, uncomplicated: Secondary | ICD-10-CM | POA: Insufficient documentation

## 2015-01-27 DIAGNOSIS — L509 Urticaria, unspecified: Secondary | ICD-10-CM | POA: Diagnosis present

## 2015-01-27 DIAGNOSIS — W57XXXA Bitten or stung by nonvenomous insect and other nonvenomous arthropods, initial encounter: Secondary | ICD-10-CM | POA: Insufficient documentation

## 2015-01-27 DIAGNOSIS — Z8619 Personal history of other infectious and parasitic diseases: Secondary | ICD-10-CM | POA: Diagnosis not present

## 2015-01-27 DIAGNOSIS — S30861A Insect bite (nonvenomous) of abdominal wall, initial encounter: Secondary | ICD-10-CM | POA: Insufficient documentation

## 2015-01-27 DIAGNOSIS — Y9289 Other specified places as the place of occurrence of the external cause: Secondary | ICD-10-CM | POA: Insufficient documentation

## 2015-01-27 DIAGNOSIS — Z720 Tobacco use: Secondary | ICD-10-CM | POA: Diagnosis not present

## 2015-01-27 MED ORDER — RANITIDINE HCL 150 MG/10ML PO SYRP
150.0000 mg | ORAL_SOLUTION | ORAL | Status: AC
  Administered 2015-01-27: 150 mg via ORAL
  Filled 2015-01-27: qty 10

## 2015-01-27 MED ORDER — FAMOTIDINE 40 MG/5ML PO SUSR: 40.0000 mg | Freq: Once | ORAL | Status: DC

## 2015-01-27 MED ORDER — DIPHENHYDRAMINE HCL 25 MG PO TABS: 25.0000 mg | ORAL_TABLET | Freq: Four times a day (QID) | ORAL | Status: DC

## 2015-01-27 MED ORDER — DIPHENHYDRAMINE HCL 50 MG/ML IJ SOLN
50.0000 mg | Freq: Once | INTRAMUSCULAR | Status: AC
  Administered 2015-01-27: 50 mg via INTRAMUSCULAR
  Filled 2015-01-27: qty 1

## 2015-01-27 MED ORDER — FAMOTIDINE 20 MG PO TABS: 20.0000 mg | ORAL_TABLET | Freq: Two times a day (BID) | ORAL | Status: DC

## 2015-01-27 MED ORDER — DIPHENHYDRAMINE HCL 50 MG/ML IJ SOLN: 50.0000 mg | Freq: Once | INTRAMUSCULAR | Status: DC

## 2015-01-27 NOTE — ED Notes (Signed)
PA at bedside.

## 2015-01-27 NOTE — ED Provider Notes (Signed)
CSN: [id number]     Arrival date & time 01/27/15  0820 History   First MD Initiated Contact with Patient 01/27/15 [phone]     Chief Complaint  Patient presents with  . Urticaria     (Consider location/radiation/quality/duration/timing/severity/associated sxs/prior Treatment) HPI [name] is a 69 y.o black female who presents for itching and scratching of her legs and back for the past 3 days.  She states it started after yard work.  She has tried benadryl with little relief.  Nothing makes it better.  Scratching makes it worse. She says she has had this several times in the past and usually relieved by benadryl. She denies any fever, hives, shortness of breath, throat or tongue swelling.  Past Medical History  Diagnosis Date  . Asthma   . Renal cell carcinoma 2012    left  . Seasonal allergies   . Hypercholesteremia     under control  . GERD (gastroesophageal reflux disease)     occasional  . Arthritis     back-severe, hips, right knee  . H/O mumps   . H/O measles   . Insomnia    Past Surgical History  Procedure Laterality Date  . Kidney surgery  12/2010    [facility]; partial nephrectomy  . Hernia repair  04/2946    supraumbilical repair  . Bunionectomy  04/2011  . Menisectomy  2010    left knee  . Cholecystectomy  11/13/2011    Procedure: LAPAROSCOPIC CHOLECYSTECTOMY WITH INTRAOPERATIVE CHOLANGIOGRAM;  Surgeon: Judieth Keens, DO;  Location: [facility];  Service: General;  Laterality: N/A;  . Esophagogastroduodenoscopy N/A 09/25/2013    Procedure: ESOPHAGOGASTRODUODENOSCOPY (EGD);  Surgeon: Lear Ng, MD;  Location: [facility];  Service: Endoscopy;  Laterality: N/A;  . Colonoscopy N/A 09/25/2013    Procedure: COLONOSCOPY;  Surgeon: Lear Ng, MD;  Location: [facility];  Service: Endoscopy;  Laterality: N/A;  . Abdominal hysterectomy  40years ago  . Tubal ligation  44 years ago  . Lumbar laminectomy/decompression microdiscectomy Right 10/12/2014    Procedure:  HEMI LAMINECTOMY MICRODISCECTOMY L5-S1 RIGHT (1 LEVEL);  Surgeon: Tobi Bastos, MD;  Location: [facility];  Service: Orthopedics;  Laterality: Right;   History reviewed. No pertinent family history. History  Substance Use Topics  . Smoking status: Current Some Day Smoker -- 0.00 packs/day for 6 years    Types: Cigarettes  . Smokeless tobacco: Never Used  . Alcohol Use: Yes     Comment: socially   OB History    No data available     Review of Systems  Musculoskeletal: Negative for myalgias and arthralgias.  Skin: Negative for wound.  All other systems reviewed and are negative.     Allergies  Azithromycin  Home Medications   Prior to Admission medications   Medication Sig Start Date End Date Taking? Authorizing Provider  albuterol (PROVENTIL HFA;VENTOLIN HFA) 108 (90 BASE) MCG/ACT inhaler Inhale 2 puffs into the lungs every 6 (six) hours as needed for wheezing or shortness of breath.    Yes Historical Provider, MD  calcium-vitamin D (OSCAL WITH D) 500-200 MG-UNIT per tablet Take 2 tablets by mouth 2 (two) times daily.    Yes Historical Provider, MD  cholecalciferol (VITAMIN D) 1000 UNITS tablet Take 1,000 Units by mouth every morning.   Yes Historical Provider, MD  citalopram (CELEXA) 10 MG tablet Take 10 mg by mouth every morning.   Yes Historical Provider, MD  DULoxetine (CYMBALTA) 60 MG capsule Take 60 mg by mouth daily.  12/27/14  Yes Historical Provider, MD  Fluticasone-Salmeterol (ADVAIR) 500-50 MCG/DOSE AEPB Inhale 1 puff into the lungs 2 (two) times daily.   Yes Historical Provider, MD  KLOR-CON M20 20 MEQ tablet Take 20 mEq by mouth 2 (two) times daily with a meal. 10/21/14  Yes Historical Provider, MD  methocarbamol (ROBAXIN) 500 MG tablet Take 1 tablet (500 mg total) by mouth every 6 (six) hours as needed for muscle spasms. 10/13/14  Yes Amber Cecilio Asper, PA-C  mupirocin cream (BACTROBAN) 2 % Apply 1 application topically 2 (two) times daily as needed (rash).   Yes  Historical Provider, MD  simvastatin (ZOCOR) 40 MG tablet Take 40 mg by mouth every morning.   Yes Historical Provider, MD  vitamin C (ASCORBIC ACID) 500 MG tablet Take 1 mg by mouth every morning.   Yes Historical Provider, MD  vitamin E 400 UNIT capsule Take 400 Units by mouth every morning.   Yes Historical Provider, MD  zolpidem (AMBIEN) 5 MG tablet Take 5 mg by mouth at bedtime. 12/20/14  Yes Historical Provider, MD  diphenhydrAMINE (BENADRYL) 25 MG tablet Take 1 tablet (25 mg total) by mouth every 6 (six) hours. 01/27/15   Zephyra Bernardi Patel-Mills, PA-C  famotidine (PEPCID) 20 MG tablet Take 1 tablet (20 mg total) by mouth 2 (two) times daily. 01/27/15   Massie Cogliano Patel-Mills, PA-C  oxyCODONE-acetaminophen (PERCOCET/ROXICET) 5-325 MG per tablet Take 1-2 tablets by mouth every 4 (four) hours as needed for moderate pain. 10/13/14   Amber Constable, PA-C  pantoprazole (PROTONIX) 20 MG tablet Take 1 tablet (20 mg total) by mouth daily. 10/20/14   Shari Upstill, PA-C   BP 151/106 mmHg  Pulse 102  Temp(Src) 98.5 F (36.9 C) (Oral)  Resp 16  SpO2 100% Physical Exam  Constitutional: She is oriented to person, place, and time. She appears well-developed and well-nourished.  Cardiovascular: Normal rate, regular rhythm and normal heart sounds.   Pulmonary/Chest: Effort normal and breath sounds normal.  Neurological: She is alert and oriented to person, place, and time.  Skin: Skin is warm and dry.  She has 2 papules on the flank area.  She also has < 10 papules on bilateral lower extremities and several of them have been scratched and open.   Nursing note and vitals reviewed.   ED Course  Procedures (including critical care time) Labs Review Labs Reviewed - No data to display  Imaging Review No results found.   EKG Interpretation None      MDM   Final diagnoses:  Insect bites   Patient presents for allergic reaction on her back and legs.  She has several pustules on her bilateral lower  extremities and 2 pustules on the flank area. No urticaria, no edema, no erythema. No tongue or leg swelling. No shortness of breath.  10:30 I have given her benadryl IM here. She says the itching is much better.   I gave her benadryl and pepcid to use at home.  She agrees with the plan and I gave her the resource guide to find a provider and f/u.      Emily Glazier, PA-C 01/28/15 1125  Emily Shanks, MD 01/28/15 1553

## 2015-01-27 NOTE — ED Notes (Signed)
Pt states she has allergies and it happens often.  She has been itching x 2 days.  Using benadryl, creams with no relief.  Itching all over.  No throat swelling.

## 2015-01-27 NOTE — Discharge Instructions (Signed)

## 2015-03-16 ENCOUNTER — Ambulatory Visit: Payer: Medicare HMO | Admitting: Cardiology

## 2015-04-14 ENCOUNTER — Emergency Department (HOSPITAL_COMMUNITY): Payer: Medicare HMO

## 2015-04-14 ENCOUNTER — Other Ambulatory Visit: Payer: Self-pay

## 2015-04-14 ENCOUNTER — Encounter (HOSPITAL_COMMUNITY): Payer: Self-pay | Admitting: Emergency Medicine

## 2015-04-14 ENCOUNTER — Emergency Department (HOSPITAL_COMMUNITY)
Admission: EM | Admit: 2015-04-14 | Discharge: 2015-04-14 | Disposition: A | Discharge status: Home / Self Care | Payer: Medicare HMO | Attending: Emergency Medicine | Admitting: Emergency Medicine

## 2015-04-14 DIAGNOSIS — S0081XA Abrasion of other part of head, initial encounter: Secondary | ICD-10-CM | POA: Diagnosis not present

## 2015-04-14 DIAGNOSIS — Y939 Activity, unspecified: Secondary | ICD-10-CM | POA: Diagnosis not present

## 2015-04-14 DIAGNOSIS — Z7951 Long term (current) use of inhaled steroids: Secondary | ICD-10-CM | POA: Insufficient documentation

## 2015-04-14 DIAGNOSIS — Z8619 Personal history of other infectious and parasitic diseases: Secondary | ICD-10-CM | POA: Diagnosis not present

## 2015-04-14 DIAGNOSIS — Z905 Acquired absence of kidney: Secondary | ICD-10-CM | POA: Diagnosis not present

## 2015-04-14 DIAGNOSIS — G4700 Insomnia, unspecified: Secondary | ICD-10-CM | POA: Insufficient documentation

## 2015-04-14 DIAGNOSIS — Z720 Tobacco use: Secondary | ICD-10-CM | POA: Insufficient documentation

## 2015-04-14 DIAGNOSIS — J45909 Unspecified asthma, uncomplicated: Secondary | ICD-10-CM | POA: Insufficient documentation

## 2015-04-14 DIAGNOSIS — K219 Gastro-esophageal reflux disease without esophagitis: Secondary | ICD-10-CM | POA: Diagnosis not present

## 2015-04-14 DIAGNOSIS — E782 Mixed hyperlipidemia: Secondary | ICD-10-CM | POA: Diagnosis not present

## 2015-04-14 DIAGNOSIS — Y908 Blood alcohol level of 240 mg/100 ml or more: Secondary | ICD-10-CM | POA: Diagnosis not present

## 2015-04-14 DIAGNOSIS — Z79899 Other long term (current) drug therapy: Secondary | ICD-10-CM | POA: Insufficient documentation

## 2015-04-14 DIAGNOSIS — W1839XA Other fall on same level, initial encounter: Secondary | ICD-10-CM | POA: Insufficient documentation

## 2015-04-14 DIAGNOSIS — Z23 Encounter for immunization: Secondary | ICD-10-CM | POA: Insufficient documentation

## 2015-04-14 DIAGNOSIS — W19XXXA Unspecified fall, initial encounter: Secondary | ICD-10-CM

## 2015-04-14 DIAGNOSIS — Z85520 Personal history of malignant carcinoid tumor of kidney: Secondary | ICD-10-CM | POA: Diagnosis not present

## 2015-04-14 DIAGNOSIS — M159 Polyosteoarthritis, unspecified: Secondary | ICD-10-CM | POA: Insufficient documentation

## 2015-04-14 DIAGNOSIS — S0093XA Contusion of unspecified part of head, initial encounter: Secondary | ICD-10-CM

## 2015-04-14 DIAGNOSIS — Z791 Long term (current) use of non-steroidal anti-inflammatories (NSAID): Secondary | ICD-10-CM | POA: Diagnosis not present

## 2015-04-14 DIAGNOSIS — S0083XA Contusion of other part of head, initial encounter: Secondary | ICD-10-CM | POA: Diagnosis not present

## 2015-04-14 DIAGNOSIS — F10129 Alcohol abuse with intoxication, unspecified: Secondary | ICD-10-CM | POA: Insufficient documentation

## 2015-04-14 DIAGNOSIS — Y999 Unspecified external cause status: Secondary | ICD-10-CM | POA: Diagnosis not present

## 2015-04-14 DIAGNOSIS — Y92009 Unspecified place in unspecified non-institutional (private) residence as the place of occurrence of the external cause: Secondary | ICD-10-CM | POA: Insufficient documentation

## 2015-04-14 DIAGNOSIS — R780 Finding of alcohol in blood: Secondary | ICD-10-CM

## 2015-04-14 LAB — RAPID URINE DRUG SCREEN, HOSP PERFORMED
AMPHETAMINES: NOT DETECTED
BENZODIAZEPINES: NOT DETECTED
Barbiturates: NOT DETECTED
COCAINE: NOT DETECTED
Opiates: NOT DETECTED
TETRAHYDROCANNABINOL: NOT DETECTED

## 2015-04-14 LAB — CBG MONITORING, ED: Glucose-Capillary: 97 mg/dL (ref 65–99)

## 2015-04-14 LAB — ETHANOL: Alcohol, Ethyl (B): 267 mg/dL — ABNORMAL HIGH (ref <5)

## 2015-04-14 MED ORDER — TETANUS-DIPHTH-ACELL PERTUSSIS 5-2.5-18.5 LF-MCG/0.5 IM SUSP
0.5000 mL | Freq: Once | INTRAMUSCULAR | Status: AC
  Administered 2015-04-14: 0.5 mL via INTRAMUSCULAR
  Filled 2015-04-14: qty 0.5

## 2015-04-14 MED ORDER — ACETAMINOPHEN 325 MG PO TABS
650.0000 mg | ORAL_TABLET | Freq: Once | ORAL | Status: AC
  Administered 2015-04-14: 650 mg via ORAL
  Filled 2015-04-14: qty 2

## 2015-04-14 MED ORDER — OXYCODONE-ACETAMINOPHEN 5-325 MG PO TABS: 1.0000 | ORAL_TABLET | Freq: Four times a day (QID) | ORAL | Status: DC | PRN

## 2015-04-14 NOTE — ED Notes (Signed)
MD at bedside. EDP HARRISON PRESENT

## 2015-04-14 NOTE — ED Provider Notes (Signed)
10:22 AM patient is alert and oriented and in voiding without difficulty. I discussed the resolution of her symptoms with her husband. I do agree with the previous doctor that her fall is likely related to alcohol intoxication. Will recommend discharge home and return for any worsening headache or recurrent falls.  Clinical Impression 1. Fall, initial encounter   2. Elevated ETOH level   3. Traumatic hematoma of head, initial encounter      Pamella Pert, MD 04/14/15 1024

## 2015-04-14 NOTE — ED Notes (Addendum)
Patient ambulated to restroom with minimal asistance

## 2015-04-14 NOTE — ED Provider Notes (Signed)
CSN: [id number]     Arrival date & time 04/14/15  0227 History   First MD Initiated Contact with Patient 04/14/15 [phone]     Chief Complaint  Patient presents with  . Alcohol Intoxication  . Near Syncope     (Consider location/radiation/quality/duration/timing/severity/associated sxs/prior Treatment) HPI  This is a 69 year old female who comes from home after a fall. EMS reports alcohol intoxication and possible syncopal event. Noted to have a hematoma to the 4 head. Patient is unable to tell me how or why she fell. Only oriented to self. Denies any pain. Denies taking blood thinners. Patient reports "I drink a little bit of alcohol tonight." Reports that she usually drinks vodka.  Level V caveat for acute intoxication  Past Medical History  Diagnosis Date  . Asthma   . Renal cell carcinoma 2012    left  . Seasonal allergies   . Hypercholesteremia     under control  . GERD (gastroesophageal reflux disease)     occasional  . Arthritis     back-severe, hips, right knee  . H/O mumps   . H/O measles   . Insomnia    Past Surgical History  Procedure Laterality Date  . Kidney surgery  12/2010    [facility]; partial nephrectomy  . Hernia repair  04/4402    supraumbilical repair  . Bunionectomy  04/2011  . Menisectomy  2010    left knee  . Cholecystectomy  11/13/2011    Procedure: LAPAROSCOPIC CHOLECYSTECTOMY WITH INTRAOPERATIVE CHOLANGIOGRAM;  Surgeon: Judieth Keens, DO;  Location: [facility];  Service: General;  Laterality: N/A;  . Esophagogastroduodenoscopy N/A 09/25/2013    Procedure: ESOPHAGOGASTRODUODENOSCOPY (EGD);  Surgeon: Lear Ng, MD;  Location: [facility];  Service: Endoscopy;  Laterality: N/A;  . Colonoscopy N/A 09/25/2013    Procedure: COLONOSCOPY;  Surgeon: Lear Ng, MD;  Location: [facility];  Service: Endoscopy;  Laterality: N/A;  . Abdominal hysterectomy  40years ago  . Tubal ligation  44 years ago  . Lumbar laminectomy/decompression  microdiscectomy Right 10/12/2014    Procedure: HEMI LAMINECTOMY MICRODISCECTOMY L5-S1 RIGHT (1 LEVEL);  Surgeon: Tobi Bastos, MD;  Location: [facility];  Service: Orthopedics;  Laterality: Right;   History reviewed. No pertinent family history. History  Substance Use Topics  . Smoking status: Current Some Day Smoker -- 0.00 packs/day for 6 years    Types: Cigarettes  . Smokeless tobacco: Never Used  . Alcohol Use: Yes     Comment: socially   OB History    No data available     Review of Systems  Unable to perform ROS: Other      Allergies  Azithromycin  Home Medications   Prior to Admission medications   Medication Sig Start Date End Date Taking? Authorizing Provider  albuterol (PROVENTIL HFA;VENTOLIN HFA) 108 (90 BASE) MCG/ACT inhaler Inhale 2 puffs into the lungs every 6 (six) hours as needed for wheezing or shortness of breath.    Yes Historical Provider, MD  calcium-vitamin D (OSCAL WITH D) 500-200 MG-UNIT per tablet Take 2 tablets by mouth 2 (two) times daily.    Yes Historical Provider, MD  cholecalciferol (VITAMIN D) 1000 UNITS tablet Take 1,000 Units by mouth every morning.   Yes Historical Provider, MD  citalopram (CELEXA) 10 MG tablet Take 10 mg by mouth every morning.   Yes Historical Provider, MD  diphenhydrAMINE (BENADRYL) 25 MG tablet Take 1 tablet (25 mg total) by mouth every 6 (six) hours. 01/27/15  Yes Ottie Glazier,  PA-C  DULoxetine (CYMBALTA) 60 MG capsule Take 60 mg by mouth daily. 12/27/14  Yes Historical Provider, MD  famotidine (PEPCID) 20 MG tablet Take 1 tablet (20 mg total) by mouth 2 (two) times daily. 01/27/15  Yes Hanna Patel-Mills, PA-C  Fluticasone-Salmeterol (ADVAIR) 500-50 MCG/DOSE AEPB Inhale 1 puff into the lungs 2 (two) times daily.   Yes Historical Provider, MD  KLOR-CON M20 20 MEQ tablet Take 20 mEq by mouth 2 (two) times daily with a meal. 10/21/14  Yes Historical Provider, MD  Magnesium Oxide 400 (240 MG) MG TABS Take 1 tablet by mouth  daily. 04/02/15  Yes Historical Provider, MD  meloxicam (MOBIC) 15 MG tablet Take 15 mg by mouth daily. 04/02/15  Yes Historical Provider, MD  mupirocin cream (BACTROBAN) 2 % Apply 1 application topically 2 (two) times daily as needed (rash).   Yes Historical Provider, MD  oxyCODONE-acetaminophen (PERCOCET/ROXICET) 5-325 MG per tablet Take 1-2 tablets by mouth every 4 (four) hours as needed for moderate pain. 10/13/14  Yes Amber Cecilio Asper, PA-C  pantoprazole (PROTONIX) 20 MG tablet Take 1 tablet (20 mg total) by mouth daily. 10/20/14  Yes Shari Upstill, PA-C  simvastatin (ZOCOR) 40 MG tablet Take 40 mg by mouth every morning.   Yes Historical Provider, MD  traMADol (ULTRAM) 50 MG tablet Take 1 tablet by mouth every 4 (four) hours as needed. 04/03/15  Yes Historical Provider, MD  vitamin C (ASCORBIC ACID) 500 MG tablet Take 1 mg by mouth every morning.   Yes Historical Provider, MD  vitamin E 400 UNIT capsule Take 400 Units by mouth every morning.   Yes Historical Provider, MD  zolpidem (AMBIEN) 5 MG tablet Take 5 mg by mouth at bedtime. 12/20/14  Yes Historical Provider, MD  methocarbamol (ROBAXIN) 500 MG tablet Take 1 tablet (500 mg total) by mouth every 6 (six) hours as needed for muscle spasms. Patient not taking: Reported on 04/14/2015 10/13/14   Amber Constable, PA-C   BP 127/84 mmHg  Pulse 97  Temp(Src) 98.6 F (37 C) (Oral)  Resp 16  SpO2 97% Physical Exam  Constitutional: She appears well-developed and well-nourished.  ABCs intact  HENT:  Head: Normocephalic.  Mouth/Throat: Oropharynx is clear and moist.  4 cm hematoma over the right forehead with abrasion  Eyes: EOM are normal. Pupils are equal, round, and reactive to light.  Neck:  C-collar in place  Cardiovascular: Normal rate, regular rhythm and normal heart sounds.   Pulmonary/Chest: Effort normal and breath sounds normal. No respiratory distress. She has no wheezes.  Abdominal: Soft. Bowel sounds are normal. There is no  tenderness. There is no rebound.  Midline abdominal scarring  Musculoskeletal: Normal range of motion.  No obvious deformities  Neurological: She is alert.  Oriented only to herself, appears intoxicated, moves all 4 extremities spontaneously  Skin: Skin is warm and dry.  Psychiatric: She has a normal mood and affect.  Nursing note and vitals reviewed.   ED Course  Procedures (including critical care time) Labs Review Labs Reviewed  ETHANOL - Abnormal; Notable for the following:    Alcohol, Ethyl (B) 267 (*)    All other components within normal limits  URINE RAPID DRUG SCREEN (HOSP PERFORMED) NOT AT [facility]  CBG MONITORING, ED    Imaging Review Ct Head Wo Contrast  04/14/2015   CLINICAL DATA:  Near syncope. Alcohol intoxication. Right forehead hematoma.  EXAM: CT HEAD WITHOUT CONTRAST  CT CERVICAL SPINE WITHOUT CONTRAST  TECHNIQUE: Multidetector CT imaging of the head  and cervical spine was performed following the standard protocol without intravenous contrast. Multiplanar CT image reconstructions of the cervical spine were also generated.  COMPARISON:  Head CT 12/03/2010  FINDINGS: CT HEAD FINDINGS  No intracranial hemorrhage, mass effect, or midline shift. Atrophy is progressed from prior exam, advanced for age. No hydrocephalus. The basilar cisterns are patent. No evidence of territorial infarct. No intracranial fluid collection. Right frontal scalp hematoma without subjacent fracture. Calvarium is intact. Included paranasal sinuses are well aerated. Minimal opacification of lower left mastoid air cells. Right mastoid air cells are well aerated.  CT CERVICAL SPINE FINDINGS  Mild straightening of normal lordosis. There is trace retrolisthesis of C3 on C4, and mild anterolisthesis of C7 on T1 that appears degenerative. Vertebral body heights are preserved. There is no fracture. The dens is intact. There are no jumped or perched facets. Multilevel disc space narrowing most significant at C3-C4.  Multilevel facet arthropathy. No prevertebral soft tissue edema. There is biapical pleural parenchymal scarring at the lung apices. Retained secretions noted in the trachea.  IMPRESSION: 1. Right frontal scalp hematoma without fracture or acute intracranial abnormality. 2. Progressive atrophy from prior exam, advanced for age. 3. Multilevel degenerative change throughout the cervical spine without acute fracture or subluxation.   Electronically Signed   By: Jeb Levering M.D.   On: 04/14/2015 03:50   Ct Cervical Spine Wo Contrast  04/14/2015   CLINICAL DATA:  Near syncope. Alcohol intoxication. Right forehead hematoma.  EXAM: CT HEAD WITHOUT CONTRAST  CT CERVICAL SPINE WITHOUT CONTRAST  TECHNIQUE: Multidetector CT imaging of the head and cervical spine was performed following the standard protocol without intravenous contrast. Multiplanar CT image reconstructions of the cervical spine were also generated.  COMPARISON:  Head CT 12/03/2010  FINDINGS: CT HEAD FINDINGS  No intracranial hemorrhage, mass effect, or midline shift. Atrophy is progressed from prior exam, advanced for age. No hydrocephalus. The basilar cisterns are patent. No evidence of territorial infarct. No intracranial fluid collection. Right frontal scalp hematoma without subjacent fracture. Calvarium is intact. Included paranasal sinuses are well aerated. Minimal opacification of lower left mastoid air cells. Right mastoid air cells are well aerated.  CT CERVICAL SPINE FINDINGS  Mild straightening of normal lordosis. There is trace retrolisthesis of C3 on C4, and mild anterolisthesis of C7 on T1 that appears degenerative. Vertebral body heights are preserved. There is no fracture. The dens is intact. There are no jumped or perched facets. Multilevel disc space narrowing most significant at C3-C4. Multilevel facet arthropathy. No prevertebral soft tissue edema. There is biapical pleural parenchymal scarring at the lung apices. Retained secretions  noted in the trachea.  IMPRESSION: 1. Right frontal scalp hematoma without fracture or acute intracranial abnormality. 2. Progressive atrophy from prior exam, advanced for age. 3. Multilevel degenerative change throughout the cervical spine without acute fracture or subluxation.   Electronically Signed   By: Jeb Levering M.D.   On: 04/14/2015 03:50     EKG Interpretation None      MDM   Final diagnoses:  None    Patient presents following a fall. Acutely intoxicated. Noted to have hematoma the right for head. Oriented only to self.  CT head and neck obtained. Blood alcohol UDS obtained. Blood alcohol level is 267.  CT head and neck reassuring with no intracranial pathology.  6:27 AM On recheck, patient sleeping and snoring. Arousable but unable to clear her c-collar. Patient will need reassessment for c-collar clearance and discharge.    Loma Sousa  Gwinda Passe, MD 04/14/15 [phone]

## 2015-04-14 NOTE — ED Notes (Signed)
MD at bedside. EDP HARRISON TO RE EVALUATE THIS PT

## 2015-04-14 NOTE — ED Notes (Signed)
Pt presents via EMS after having an unknown amount of alcohol tonight and having a possible syncopal episode. Hematoma on R forehead. Collar in place. No blood thinners. EKG unremarkable.

## 2015-04-14 NOTE — ED Notes (Signed)
Bed: WA07 Expected date:  Expected time:  Means of arrival:  Comments: 

## 2015-04-14 NOTE — ED Notes (Signed)
Pt called a cab

## 2015-04-14 NOTE — Discharge Instructions (Signed)
Alcohol Intoxication °Alcohol intoxication occurs when you drink enough alcohol that it affects your ability to function. It can be mild or very severe. Drinking a lot of alcohol in a short time is called binge drinking. This can be very harmful. Drinking alcohol can also be more dangerous if you are taking medicines or other drugs. Some of the effects caused by alcohol may include: °· Loss of coordination. °· Changes in mood and behavior. °· Unclear thinking. °· Trouble talking (slurred speech). °· Throwing up (vomiting). °· Confusion. °· Slowed breathing. °· Twitching and shaking (seizures). °· Loss of consciousness. °HOME CARE °· Do not drive after drinking alcohol. °· Drink enough water and fluids to keep your pee (urine) clear or pale yellow. Avoid caffeine. °· Only take medicine as told by your doctor. °GET HELP IF: °· You throw up (vomit) many times. °· You do not feel better after a few days. °· You frequently have alcohol intoxication. Your doctor can help decide if you should see a substance use treatment counselor. °GET HELP RIGHT AWAY IF: °· You become shaky when you stop drinking. °· You have twitching and shaking. °· You throw up blood. It may look bright red or like coffee grounds. °· You notice blood in your poop (bowel movements). °· You become lightheaded or pass out (faint). °MAKE SURE YOU:  °· Understand these instructions. °· Will watch your condition. °· Will get help right away if you are not doing well or get worse. °Document Released: 04/15/2008 Document Revised: 06/30/2013 Document Reviewed: 04/02/2013 °Exit[employer]® Patient Information ©2015 [employer]. This information is not intended to replace advice given to you by your health care provider. Make sure you discuss any questions you have with your health care provider. ° °

## 2015-04-21 ENCOUNTER — Encounter (HOSPITAL_COMMUNITY): Payer: Self-pay | Admitting: Emergency Medicine

## 2015-04-21 ENCOUNTER — Inpatient Hospital Stay (HOSPITAL_COMMUNITY)
Admission: EM | Admit: 2015-04-21 | Discharge: 2015-04-23 | DRG: 689 | Disposition: A | Discharge status: Home / Self Care | Payer: Medicare HMO | Attending: Family Medicine | Admitting: Family Medicine

## 2015-04-21 DIAGNOSIS — Z85528 Personal history of other malignant neoplasm of kidney: Secondary | ICD-10-CM

## 2015-04-21 DIAGNOSIS — Z79899 Other long term (current) drug therapy: Secondary | ICD-10-CM

## 2015-04-21 DIAGNOSIS — Z79891 Long term (current) use of opiate analgesic: Secondary | ICD-10-CM

## 2015-04-21 DIAGNOSIS — R4182 Altered mental status, unspecified: Secondary | ICD-10-CM | POA: Diagnosis not present

## 2015-04-21 DIAGNOSIS — M1990 Unspecified osteoarthritis, unspecified site: Secondary | ICD-10-CM | POA: Diagnosis present

## 2015-04-21 DIAGNOSIS — G9341 Metabolic encephalopathy: Secondary | ICD-10-CM | POA: Diagnosis present

## 2015-04-21 DIAGNOSIS — Z881 Allergy status to other antibiotic agents status: Secondary | ICD-10-CM

## 2015-04-21 DIAGNOSIS — I10 Essential (primary) hypertension: Secondary | ICD-10-CM | POA: Diagnosis present

## 2015-04-21 DIAGNOSIS — K219 Gastro-esophageal reflux disease without esophagitis: Secondary | ICD-10-CM | POA: Diagnosis present

## 2015-04-21 DIAGNOSIS — J45909 Unspecified asthma, uncomplicated: Secondary | ICD-10-CM | POA: Diagnosis present

## 2015-04-21 DIAGNOSIS — F17210 Nicotine dependence, cigarettes, uncomplicated: Secondary | ICD-10-CM | POA: Diagnosis present

## 2015-04-21 DIAGNOSIS — Z9181 History of falling: Secondary | ICD-10-CM

## 2015-04-21 DIAGNOSIS — R410 Disorientation, unspecified: Secondary | ICD-10-CM | POA: Diagnosis present

## 2015-04-21 DIAGNOSIS — N390 Urinary tract infection, site not specified: Principal | ICD-10-CM | POA: Diagnosis present

## 2015-04-21 DIAGNOSIS — E780 Pure hypercholesterolemia: Secondary | ICD-10-CM | POA: Diagnosis present

## 2015-04-21 LAB — CBC
HCT: 32.7 % — ABNORMAL LOW (ref 36.0–46.0)
Hemoglobin: 10.7 g/dL — ABNORMAL LOW (ref 12.0–15.0)
MCH: 33.2 pg (ref 26.0–34.0)
MCHC: 32.7 g/dL (ref 30.0–36.0)
MCV: 101.6 fL — AB (ref 78.0–100.0)
PLATELETS: 258 10*3/uL (ref 150–400)
RBC: 3.22 MIL/uL — AB (ref 3.87–5.11)
RDW: 16.5 % — AB (ref 11.5–15.5)
WBC: 7.1 10*3/uL (ref 4.0–10.5)

## 2015-04-21 MED ORDER — LORAZEPAM 2 MG/ML IJ SOLN
1.0000 mg | Freq: Once | INTRAMUSCULAR | Status: AC
  Administered 2015-04-22: 1 mg via INTRAVENOUS
  Filled 2015-04-21: qty 1

## 2015-04-21 NOTE — ED Notes (Signed)
Pt from home via EMS has been acting "irratic" according to husband for the past 3 days. Today these behaviors have increased. Pt will only repeat words and sway back and forth. Her husband thinks she may have accidentally taken too many of her pills, but according to EMS, all of the pills have been accounted for. Husband reported that the pt had a fall and hit her head on 6/3.

## 2015-04-21 NOTE — ED Notes (Signed)
Bed: [facility] Expected date:  Expected time:  Means of arrival:  Comments: EMS 69 yo female/manic

## 2015-04-22 ENCOUNTER — Emergency Department (HOSPITAL_COMMUNITY): Payer: Medicare HMO

## 2015-04-22 DIAGNOSIS — Z9181 History of falling: Secondary | ICD-10-CM | POA: Diagnosis not present

## 2015-04-22 DIAGNOSIS — M1990 Unspecified osteoarthritis, unspecified site: Secondary | ICD-10-CM | POA: Diagnosis present

## 2015-04-22 DIAGNOSIS — Z79891 Long term (current) use of opiate analgesic: Secondary | ICD-10-CM | POA: Diagnosis not present

## 2015-04-22 DIAGNOSIS — Z85528 Personal history of other malignant neoplasm of kidney: Secondary | ICD-10-CM | POA: Diagnosis not present

## 2015-04-22 DIAGNOSIS — Z79899 Other long term (current) drug therapy: Secondary | ICD-10-CM | POA: Diagnosis not present

## 2015-04-22 DIAGNOSIS — G9341 Metabolic encephalopathy: Secondary | ICD-10-CM | POA: Diagnosis present

## 2015-04-22 DIAGNOSIS — K219 Gastro-esophageal reflux disease without esophagitis: Secondary | ICD-10-CM | POA: Diagnosis present

## 2015-04-22 DIAGNOSIS — R4182 Altered mental status, unspecified: Secondary | ICD-10-CM | POA: Diagnosis present

## 2015-04-22 DIAGNOSIS — E780 Pure hypercholesterolemia: Secondary | ICD-10-CM | POA: Diagnosis present

## 2015-04-22 DIAGNOSIS — R410 Disorientation, unspecified: Secondary | ICD-10-CM | POA: Diagnosis not present

## 2015-04-22 DIAGNOSIS — I10 Essential (primary) hypertension: Secondary | ICD-10-CM | POA: Diagnosis present

## 2015-04-22 DIAGNOSIS — Z881 Allergy status to other antibiotic agents status: Secondary | ICD-10-CM | POA: Diagnosis not present

## 2015-04-22 DIAGNOSIS — F17210 Nicotine dependence, cigarettes, uncomplicated: Secondary | ICD-10-CM | POA: Diagnosis present

## 2015-04-22 DIAGNOSIS — N390 Urinary tract infection, site not specified: Secondary | ICD-10-CM | POA: Diagnosis present

## 2015-04-22 DIAGNOSIS — J45909 Unspecified asthma, uncomplicated: Secondary | ICD-10-CM | POA: Diagnosis present

## 2015-04-22 LAB — COMPREHENSIVE METABOLIC PANEL
ALBUMIN: 4 g/dL (ref 3.5–5.0)
ALT: 46 U/L (ref 14–54)
AST: 76 U/L — AB (ref 15–41)
Alkaline Phosphatase: 349 U/L — ABNORMAL HIGH (ref 38–126)
Anion gap: 13 (ref 5–15)
BILIRUBIN TOTAL: 0.7 mg/dL (ref 0.3–1.2)
BUN: 11 mg/dL (ref 6–20)
CO2: 26 mmol/L (ref 22–32)
Calcium: 9.6 mg/dL (ref 8.9–10.3)
Chloride: 103 mmol/L (ref 101–111)
Creatinine, Ser: 0.62 mg/dL (ref 0.44–1.00)
GFR calc Af Amer: >60 mL/min (ref >60)
Glucose, Bld: 90 mg/dL (ref 65–99)
Potassium: 3.6 mmol/L (ref 3.5–5.1)
Sodium: 142 mmol/L (ref 135–145)
TOTAL PROTEIN: 8.2 g/dL — AB (ref 6.5–8.1)

## 2015-04-22 LAB — CBC
HCT: 31.5 % — ABNORMAL LOW (ref 36.0–46.0)
HEMOGLOBIN: 10.4 g/dL — AB (ref 12.0–15.0)
MCH: 33.1 pg (ref 26.0–34.0)
MCHC: 33 g/dL (ref 30.0–36.0)
MCV: 100.3 fL — ABNORMAL HIGH (ref 78.0–100.0)
PLATELETS: 223 10*3/uL (ref 150–400)
RBC: 3.14 MIL/uL — AB (ref 3.87–5.11)
RDW: 16.7 % — AB (ref 11.5–15.5)
WBC: 8.8 10*3/uL (ref 4.0–10.5)

## 2015-04-22 LAB — BASIC METABOLIC PANEL
Anion gap: 12 (ref 5–15)
BUN: 12 mg/dL (ref 6–20)
CALCIUM: 9.3 mg/dL (ref 8.9–10.3)
CHLORIDE: 104 mmol/L (ref 101–111)
CO2: 25 mmol/L (ref 22–32)
Creatinine, Ser: 0.55 mg/dL (ref 0.44–1.00)
Glucose, Bld: 95 mg/dL (ref 65–99)
Potassium: 3.6 mmol/L (ref 3.5–5.1)
Sodium: 141 mmol/L (ref 135–145)

## 2015-04-22 LAB — URINALYSIS, ROUTINE W REFLEX MICROSCOPIC
Bilirubin Urine: NEGATIVE
Glucose, UA: NEGATIVE mg/dL
HGB URINE DIPSTICK: NEGATIVE
Ketones, ur: NEGATIVE mg/dL
Nitrite: POSITIVE — AB
PH: 5.5 (ref 5.0–8.0)
PROTEIN: 30 mg/dL — AB
SPECIFIC GRAVITY, URINE: 1.029 (ref 1.005–1.030)
UROBILINOGEN UA: 1 mg/dL (ref 0.0–1.0)

## 2015-04-22 LAB — URINE MICROSCOPIC-ADD ON

## 2015-04-22 LAB — RAPID URINE DRUG SCREEN, HOSP PERFORMED
Amphetamines: NOT DETECTED
Barbiturates: NOT DETECTED
Benzodiazepines: NOT DETECTED
COCAINE: NOT DETECTED
Opiates: POSITIVE — AB
Tetrahydrocannabinol: NOT DETECTED

## 2015-04-22 LAB — ACETAMINOPHEN LEVEL: Acetaminophen (Tylenol), Serum: <10 ug/mL — ABNORMAL LOW (ref 10–30)

## 2015-04-22 LAB — SALICYLATE LEVEL: Salicylate Lvl: <4 mg/dL (ref 2.8–30.0)

## 2015-04-22 LAB — ETHANOL: Alcohol, Ethyl (B): <5 mg/dL (ref <5)

## 2015-04-22 MED ORDER — LORAZEPAM 2 MG/ML IJ SOLN
1.0000 mg | INTRAMUSCULAR | Status: AC
  Administered 2015-04-22: 1 mg via INTRAVENOUS

## 2015-04-22 MED ORDER — LORAZEPAM 2 MG/ML IJ SOLN
1.0000 mg | INTRAMUSCULAR | Status: DC | PRN
  Administered 2015-04-22 – 2015-04-23 (×2): 1 mg via INTRAVENOUS
  Filled 2015-04-22 (×2): qty 1

## 2015-04-22 MED ORDER — METHOCARBAMOL 500 MG PO TABS
500.0000 mg | ORAL_TABLET | Freq: Once | ORAL | Status: AC
  Administered 2015-04-22: 500 mg via ORAL
  Filled 2015-04-22: qty 1

## 2015-04-22 MED ORDER — THIAMINE HCL 100 MG/ML IJ SOLN
100.0000 mg | Freq: Every day | INTRAMUSCULAR | Status: DC
  Administered 2015-04-22: 100 mg via INTRAVENOUS
  Filled 2015-04-22 (×2): qty 1

## 2015-04-22 MED ORDER — MOMETASONE FURO-FORMOTEROL FUM 200-5 MCG/ACT IN AERO
2.0000 | INHALATION_SPRAY | Freq: Two times a day (BID) | RESPIRATORY_TRACT | Status: DC
  Administered 2015-04-22 – 2015-04-23 (×2): 2 via RESPIRATORY_TRACT
  Filled 2015-04-22: qty 8.8

## 2015-04-22 MED ORDER — VITAMIN B-1 100 MG PO TABS
100.0000 mg | ORAL_TABLET | Freq: Every day | ORAL | Status: DC
  Administered 2015-04-23: 100 mg via ORAL
  Filled 2015-04-22 (×2): qty 1

## 2015-04-22 MED ORDER — ONDANSETRON HCL 4 MG/2ML IJ SOLN: 4.0000 mg | Freq: Four times a day (QID) | INTRAMUSCULAR | Status: DC | PRN

## 2015-04-22 MED ORDER — LORAZEPAM 1 MG PO TABS: 1.0000 mg | ORAL_TABLET | Freq: Four times a day (QID) | ORAL | Status: DC | PRN

## 2015-04-22 MED ORDER — ENOXAPARIN SODIUM 40 MG/0.4ML ~~LOC~~ SOLN
40.0000 mg | SUBCUTANEOUS | Status: DC
  Administered 2015-04-22 – 2015-04-23 (×2): 40 mg via SUBCUTANEOUS
  Filled 2015-04-22 (×3): qty 0.4

## 2015-04-22 MED ORDER — ACETAMINOPHEN 650 MG RE SUPP: 650.0000 mg | Freq: Four times a day (QID) | RECTAL | Status: DC | PRN

## 2015-04-22 MED ORDER — FOLIC ACID 1 MG PO TABS
1.0000 mg | ORAL_TABLET | Freq: Every day | ORAL | Status: DC
  Administered 2015-04-22 – 2015-04-23 (×2): 1 mg via ORAL
  Filled 2015-04-22 (×2): qty 1

## 2015-04-22 MED ORDER — LORAZEPAM 2 MG/ML IJ SOLN
0.0000 mg | Freq: Four times a day (QID) | INTRAMUSCULAR | Status: DC
Start: 2015-04-22 — End: 2015-04-23
  Administered 2015-04-22: 2 mg via INTRAVENOUS
  Filled 2015-04-22: qty 1

## 2015-04-22 MED ORDER — DEXTROSE 5 % IV SOLN
1.0000 g | Freq: Once | INTRAVENOUS | Status: AC
  Administered 2015-04-22: 1 g via INTRAVENOUS
  Filled 2015-04-22: qty 10

## 2015-04-22 MED ORDER — CITALOPRAM HYDROBROMIDE 10 MG PO TABS
10.0000 mg | ORAL_TABLET | Freq: Every morning | ORAL | Status: DC
  Administered 2015-04-22 – 2015-04-23 (×2): 10 mg via ORAL
  Filled 2015-04-22 (×2): qty 1

## 2015-04-22 MED ORDER — PANTOPRAZOLE SODIUM 20 MG PO TBEC
20.0000 mg | DELAYED_RELEASE_TABLET | Freq: Every day | ORAL | Status: DC
  Administered 2015-04-22 – 2015-04-23 (×2): 20 mg via ORAL
  Filled 2015-04-22 (×2): qty 1

## 2015-04-22 MED ORDER — SIMVASTATIN 40 MG PO TABS
40.0000 mg | ORAL_TABLET | Freq: Every morning | ORAL | Status: DC
  Administered 2015-04-23: 40 mg via ORAL
  Filled 2015-04-22 (×2): qty 1

## 2015-04-22 MED ORDER — ALUM & MAG HYDROXIDE-SIMETH 200-200-20 MG/5ML PO SUSP: 30.0000 mL | Freq: Four times a day (QID) | ORAL | Status: DC | PRN

## 2015-04-22 MED ORDER — LORAZEPAM 2 MG/ML IJ SOLN: 0.0000 mg | Freq: Two times a day (BID) | INTRAMUSCULAR | Status: DC

## 2015-04-22 MED ORDER — ACETAMINOPHEN 325 MG PO TABS
650.0000 mg | ORAL_TABLET | Freq: Four times a day (QID) | ORAL | Status: DC | PRN
Start: 2015-04-22 — End: 2015-04-23
  Administered 2015-04-22: 650 mg via ORAL
  Filled 2015-04-22: qty 2

## 2015-04-22 MED ORDER — FAMOTIDINE 20 MG PO TABS
20.0000 mg | ORAL_TABLET | Freq: Two times a day (BID) | ORAL | Status: DC
  Administered 2015-04-22 – 2015-04-23 (×3): 20 mg via ORAL
  Filled 2015-04-22 (×4): qty 1

## 2015-04-22 MED ORDER — ONDANSETRON HCL 4 MG PO TABS: 4.0000 mg | ORAL_TABLET | Freq: Four times a day (QID) | ORAL | Status: DC | PRN

## 2015-04-22 MED ORDER — CEFTRIAXONE SODIUM IN DEXTROSE 20 MG/ML IV SOLN
1.0000 g | INTRAVENOUS | Status: DC
  Filled 2015-04-22: qty 50

## 2015-04-22 MED ORDER — MELOXICAM 15 MG PO TABS
15.0000 mg | ORAL_TABLET | Freq: Every day | ORAL | Status: DC
  Administered 2015-04-22 – 2015-04-23 (×2): 15 mg via ORAL
  Filled 2015-04-22 (×2): qty 1

## 2015-04-22 MED ORDER — POTASSIUM CHLORIDE CRYS ER 20 MEQ PO TBCR
20.0000 meq | EXTENDED_RELEASE_TABLET | Freq: Two times a day (BID) | ORAL | Status: DC
  Administered 2015-04-22 – 2015-04-23 (×3): 20 meq via ORAL
  Filled 2015-04-22 (×5): qty 1

## 2015-04-22 MED ORDER — ALBUTEROL SULFATE (2.5 MG/3ML) 0.083% IN NEBU: 2.5000 mg | INHALATION_SOLUTION | Freq: Four times a day (QID) | RESPIRATORY_TRACT | Status: DC | PRN

## 2015-04-22 MED ORDER — ADULT MULTIVITAMIN W/MINERALS CH
1.0000 | ORAL_TABLET | Freq: Every day | ORAL | Status: DC
  Administered 2015-04-22 – 2015-04-23 (×2): 1 via ORAL
  Filled 2015-04-22 (×2): qty 1

## 2015-04-22 MED ORDER — LORAZEPAM 2 MG/ML IJ SOLN
1.0000 mg | Freq: Four times a day (QID) | INTRAMUSCULAR | Status: DC | PRN
  Filled 2015-04-22: qty 1

## 2015-04-22 NOTE — Progress Notes (Signed)
Patient seen and evaluated earlier the same by my associate. Please refer to H&P for details regarding assessment and plan.  Patient seen and evaluated and no new complaints. Gen.: Patient in no acute distress, awake and alert Pulmonary: No increased work of breathing, no wheezes, no rales CV: No cyanosis  We'll plan on reassessing next a.m. unless there is acute medical issue requiring further evaluation  [name], Linward Foster

## 2015-04-22 NOTE — ED Notes (Signed)
Pt transported to CT ?

## 2015-04-22 NOTE — Progress Notes (Signed)
Attempted to complete admission history, pt able to answer some questions, no family at bedside

## 2015-04-22 NOTE — ED Notes (Signed)
Bed: WA20 Expected date:  Expected time:  Means of arrival:  Comments: 

## 2015-04-22 NOTE — Progress Notes (Signed)
Pt arrived to unit room 1511 via stretcher. VS taken. Pt disoriented x3 , confused and impulsive. Speech garbled. No family at the bedside. Pt currently sleeping. Initial assessment completed, will continue to monitor throughout shift.

## 2015-04-22 NOTE — H&P (Signed)
History and Physical  DERIAN PFOST UGQ:[id number] DOB: 11/15/1945 DOA: 04/21/2015  Referring physician: Dr Dina Rich, ED physician PCP: Kevan Ny, MD   Chief Complaint: Altered mental status  HPI: [name] is a 69 y.o. female  Medical information obtained from chart as patient presents with altered mental status. She has a history of GERD, hypercholesterolemia, renal cell carcinoma, arthritis. She was brought to the hospital by EMS due to erratic behavior at home as started earlier today. There is no family member present to discuss care with an attempts to contact family members run successful. She was recently seen in the emergency department after a fall while intoxicated. Her blood alcohol level was in the 200 range at that time. CT of her head was normal and the patient was sent home. While in the emergency department this visit, nurses report that the patient is intermittently oriented to person and place and time, however on my exam the patient is not.  Review of Systems:  Unable to obtain due to altered mental status   Past Medical History  Diagnosis Date  . Asthma   . Renal cell carcinoma 2012    left  . Seasonal allergies   . Hypercholesteremia     under control  . GERD (gastroesophageal reflux disease)     occasional  . Arthritis     back-severe, hips, right knee  . H/O mumps   . H/O measles   . Insomnia    Past Surgical History  Procedure Laterality Date  . Kidney surgery  12/2010    [facility]; partial nephrectomy  . Hernia repair  06/8279    supraumbilical repair  . Bunionectomy  04/2011  . Menisectomy  2010    left knee  . Cholecystectomy  11/13/2011    Procedure: LAPAROSCOPIC CHOLECYSTECTOMY WITH INTRAOPERATIVE CHOLANGIOGRAM;  Surgeon: Judieth Keens, DO;  Location: [facility];  Service: General;  Laterality: N/A;  . Esophagogastroduodenoscopy N/A 09/25/2013    Procedure: ESOPHAGOGASTRODUODENOSCOPY (EGD);  Surgeon: Lear Ng, MD;  Location: [facility];   Service: Endoscopy;  Laterality: N/A;  . Colonoscopy N/A 09/25/2013    Procedure: COLONOSCOPY;  Surgeon: Lear Ng, MD;  Location: [facility];  Service: Endoscopy;  Laterality: N/A;  . Abdominal hysterectomy  40years ago  . Tubal ligation  44 years ago  . Lumbar laminectomy/decompression microdiscectomy Right 10/12/2014    Procedure: HEMI LAMINECTOMY MICRODISCECTOMY L5-S1 RIGHT (1 LEVEL);  Surgeon: Tobi Bastos, MD;  Location: [facility];  Service: Orthopedics;  Laterality: Right;   Social History:  reports that she has been smoking Cigarettes.  She has been smoking about 0.00 packs per day for the past 6 years. She has never used smokeless tobacco. She reports that she drinks alcohol. She reports that she does not use illicit drugs. Patient lives ahome & is able to participate in activities of daily living   Allergies  Allergen Reactions  . Azithromycin Itching and Swelling    History reviewed. No pertinent family history.  Unable to obtain due to patient's mental status changes   Prior to Admission medications   Medication Sig Start Date End Date Taking? Authorizing Provider  albuterol (PROVENTIL HFA;VENTOLIN HFA) 108 (90 BASE) MCG/ACT inhaler Inhale 2 puffs into the lungs every 6 (six) hours as needed for wheezing or shortness of breath.    Yes Historical Provider, MD  calcium-vitamin D (OSCAL WITH D) 500-200 MG-UNIT per tablet Take 2 tablets by mouth 2 (two) times daily.    Yes Historical  Provider, MD  cholecalciferol (VITAMIN D) 1000 UNITS tablet Take 1,000 Units by mouth every morning.   Yes Historical Provider, MD  citalopram (CELEXA) 10 MG tablet Take 10 mg by mouth every morning.   Yes Historical Provider, MD  DULoxetine (CYMBALTA) 60 MG capsule Take 60 mg by mouth daily. 12/27/14  Yes Historical Provider, MD  Fluticasone-Salmeterol (ADVAIR) 500-50 MCG/DOSE AEPB Inhale 1 puff into the lungs 2 (two) times daily.   Yes Historical Provider, MD  KLOR-CON M20 20 MEQ tablet  Take 20 mEq by mouth 2 (two) times daily with a meal. 10/21/14  Yes Historical Provider, MD  Magnesium Oxide 400 (240 MG) MG TABS Take 1 tablet by mouth daily. 04/02/15  Yes Historical Provider, MD  meloxicam (MOBIC) 15 MG tablet Take 15 mg by mouth daily. 04/02/15  Yes Historical Provider, MD  mupirocin cream (BACTROBAN) 2 % Apply 1 application topically 2 (two) times daily as needed (rash).   Yes Historical Provider, MD  oxyCODONE-acetaminophen (PERCOCET) 5-325 MG per tablet Take 1 tablet by mouth every 6 (six) hours as needed. 04/14/15  Yes Pamella Pert, MD  pantoprazole (PROTONIX) 20 MG tablet Take 1 tablet (20 mg total) by mouth daily. 10/20/14  Yes Shari Upstill, PA-C  simvastatin (ZOCOR) 40 MG tablet Take 40 mg by mouth every morning.   Yes Historical Provider, MD  traMADol (ULTRAM) 50 MG tablet Take 1 tablet by mouth every 4 (four) hours as needed for moderate pain.  04/03/15  Yes Historical Provider, MD  vitamin C (ASCORBIC ACID) 500 MG tablet Take 1 mg by mouth every morning.   Yes Historical Provider, MD  vitamin E 400 UNIT capsule Take 400 Units by mouth every morning.   Yes Historical Provider, MD  zolpidem (AMBIEN) 5 MG tablet Take 5 mg by mouth at bedtime. 12/20/14  Yes Historical Provider, MD  diphenhydrAMINE (BENADRYL) 25 MG tablet Take 1 tablet (25 mg total) by mouth every 6 (six) hours. Patient not taking: Reported on 04/21/2015 01/27/15   Ottie Glazier, PA-C  famotidine (PEPCID) 20 MG tablet Take 1 tablet (20 mg total) by mouth 2 (two) times daily. 01/27/15   Hanna Patel-Mills, PA-C  methocarbamol (ROBAXIN) 500 MG tablet Take 1 tablet (500 mg total) by mouth every 6 (six) hours as needed for muscle spasms. Patient not taking: Reported on 04/14/2015 10/13/14   Ardeen Jourdain, PA-C  oxyCODONE-acetaminophen (PERCOCET/ROXICET) 5-325 MG per tablet Take 1-2 tablets by mouth every 4 (four) hours as needed for moderate pain. Patient not taking: Reported on 04/21/2015 10/13/14   Ardeen Jourdain, PA-C    Physical Exam: BP 140/71 mmHg  Pulse 92  Temp(Src) 97.8 F (36.6 C) (Axillary)  Resp 16  Ht   SpO2 98%  General:  elderly black female.  Awake and alert.  She is agitated.  No acute cardiopulmonary distress.  Eyes: Pupils equal, round, reactive to light. Extraocular muscles are intact. Sclerae anicteric and noninjected.  ENT:  Moist mucosal membranes. No mucosal lesions.   Neck: Neck supple without lymphadenopathy. No carotid bruits. No masses palpated.  Cardiovascular: Regular rate with normal S1-S2 sounds. No murmurs, rubs, gallops auscultated. No JVD.  Respiratory: Good respiratory effort with no wheezes, rales, rhonchi. Lungs clear to auscultation bilaterally.  Abdomen: Soft, nontender, nondistended. Active bowel sounds. No masses or hepatosplenomegaly  Skin: Dry, warm to touch. 2+ dorsalis pedis and radial pulses. Musculoskeletal: No calf or leg pain. All major joints not erythematous nontender.  Psychiatric: delirious.  Neurologic: No focal neurological deficits, but  unable to perform specific muscle group testing. Cranial nerves II through XII are grossly intact.           Labs on Admission:  Basic Metabolic Panel:  Recent Labs Lab 04/21/15 2319  NA 142  K 3.6  CL 103  CO2 26  GLUCOSE 90  BUN 11  CREATININE 0.62  CALCIUM 9.6   Liver Function Tests:  Recent Labs Lab 04/21/15 2319  AST 76*  ALT 46  ALKPHOS 349*  BILITOT 0.7  PROT 8.2*  ALBUMIN 4.0   No results for input(s): LIPASE, AMYLASE in the last 168 hours. No results for input(s): AMMONIA in the last 168 hours. CBC:  Recent Labs Lab 04/21/15 2319  WBC 7.1  HGB 10.7*  HCT 32.7*  MCV 101.6*  PLT 258   Cardiac Enzymes: No results for input(s): CKTOTAL, CKMB, CKMBINDEX, TROPONINI in the last 168 hours.  BNP (last 3 results) No results for input(s): BNP in the last 8760 hours.  ProBNP (last 3 results) No results for input(s): PROBNP in the last 8760 hours.  CBG: No  results for input(s): GLUCAP in the last 168 hours.  Radiological Exams on Admission: No results found.   Assessment/Plan Present on Admission:  . Acute delirium . UTI (lower urinary tract infection)  This patient was discussed with the ED physician, including pertinent vitals, physical exam findings, labs, and imaging.  We also discussed care given by the ED provider.  #1 acute delirium #2 UTI #3 history of alcohol use with intoxication #4 hypertension #5 GERD  Admit due to altered mental status Urine cultures and blood cultures obtained in the emergency department Continue ceftriaxone Repeat CBC and metabolic panel in the morning CIWA protocol as unable to determine how chronic her alcohol use is CT of the head is still pending  DVT prophylaxis:Lovenox   Consultants: none   Code Status: presumed full code   Family Communication: none    Disposition Plan: pending    Truett Mainland, DO [facility] Pager [phone]

## 2015-04-22 NOTE — ED Provider Notes (Signed)
CSN: [id number]     Arrival date & time 04/21/15  2251 History   First MD Initiated Contact with Patient 04/21/15 2337     Chief Complaint  Patient presents with  . Altered Mental Status  . Medical Clearance     (Consider location/radiation/quality/duration/timing/severity/associated sxs/prior Treatment) HPI  This a 69 year old female who presents by EMS with erratic behavior from home. Per EMS, patient's husband reported increasingly erratic behavior over the last 3 days. She is disoriented and only oriented to herself. She does not contribute to history taking. Patient was seen by me on 6/3 for a fall. At that time she was noted to be intoxicated.  Level V caveat  Past Medical History  Diagnosis Date  . Asthma   . Renal cell carcinoma 2012    left  . Seasonal allergies   . Hypercholesteremia     under control  . GERD (gastroesophageal reflux disease)     occasional  . Arthritis     back-severe, hips, right knee  . H/O mumps   . H/O measles   . Insomnia    Past Surgical History  Procedure Laterality Date  . Kidney surgery  12/2010    [facility]; partial nephrectomy  . Hernia repair  02/7095    supraumbilical repair  . Bunionectomy  04/2011  . Menisectomy  2010    left knee  . Cholecystectomy  11/13/2011    Procedure: LAPAROSCOPIC CHOLECYSTECTOMY WITH INTRAOPERATIVE CHOLANGIOGRAM;  Surgeon: Judieth Keens, DO;  Location: [facility];  Service: General;  Laterality: N/A;  . Esophagogastroduodenoscopy N/A 09/25/2013    Procedure: ESOPHAGOGASTRODUODENOSCOPY (EGD);  Surgeon: Lear Ng, MD;  Location: [facility];  Service: Endoscopy;  Laterality: N/A;  . Colonoscopy N/A 09/25/2013    Procedure: COLONOSCOPY;  Surgeon: Lear Ng, MD;  Location: [facility];  Service: Endoscopy;  Laterality: N/A;  . Abdominal hysterectomy  40years ago  . Tubal ligation  44 years ago  . Lumbar laminectomy/decompression microdiscectomy Right 10/12/2014    Procedure: HEMI  LAMINECTOMY MICRODISCECTOMY L5-S1 RIGHT (1 LEVEL);  Surgeon: Tobi Bastos, MD;  Location: [facility];  Service: Orthopedics;  Laterality: Right;   History reviewed. No pertinent family history. History  Substance Use Topics  . Smoking status: Current Some Day Smoker -- 0.00 packs/day for 6 years    Types: Cigarettes  . Smokeless tobacco: Never Used  . Alcohol Use: Yes     Comment: socially   OB History    No data available     Review of Systems  Unable to perform ROS: Mental status change      Allergies  Azithromycin  Home Medications   Prior to Admission medications   Medication Sig Start Date End Date Taking? Authorizing Provider  albuterol (PROVENTIL HFA;VENTOLIN HFA) 108 (90 BASE) MCG/ACT inhaler Inhale 2 puffs into the lungs every 6 (six) hours as needed for wheezing or shortness of breath.    Yes Historical Provider, MD  calcium-vitamin D (OSCAL WITH D) 500-200 MG-UNIT per tablet Take 2 tablets by mouth 2 (two) times daily.    Yes Historical Provider, MD  cholecalciferol (VITAMIN D) 1000 UNITS tablet Take 1,000 Units by mouth every morning.   Yes Historical Provider, MD  citalopram (CELEXA) 10 MG tablet Take 10 mg by mouth every morning.   Yes Historical Provider, MD  DULoxetine (CYMBALTA) 60 MG capsule Take 60 mg by mouth daily. 12/27/14  Yes Historical Provider, MD  Fluticasone-Salmeterol (ADVAIR) 500-50 MCG/DOSE AEPB Inhale 1 puff into the lungs  2 (two) times daily.   Yes Historical Provider, MD  KLOR-CON M20 20 MEQ tablet Take 20 mEq by mouth 2 (two) times daily with a meal. 10/21/14  Yes Historical Provider, MD  Magnesium Oxide 400 (240 MG) MG TABS Take 1 tablet by mouth daily. 04/02/15  Yes Historical Provider, MD  meloxicam (MOBIC) 15 MG tablet Take 15 mg by mouth daily. 04/02/15  Yes Historical Provider, MD  mupirocin cream (BACTROBAN) 2 % Apply 1 application topically 2 (two) times daily as needed (rash).   Yes Historical Provider, MD  oxyCODONE-acetaminophen  (PERCOCET) 5-325 MG per tablet Take 1 tablet by mouth every 6 (six) hours as needed. 04/14/15  Yes Pamella Pert, MD  pantoprazole (PROTONIX) 20 MG tablet Take 1 tablet (20 mg total) by mouth daily. 10/20/14  Yes Shari Upstill, PA-C  simvastatin (ZOCOR) 40 MG tablet Take 40 mg by mouth every morning.   Yes Historical Provider, MD  traMADol (ULTRAM) 50 MG tablet Take 1 tablet by mouth every 4 (four) hours as needed for moderate pain.  04/03/15  Yes Historical Provider, MD  vitamin C (ASCORBIC ACID) 500 MG tablet Take 1 mg by mouth every morning.   Yes Historical Provider, MD  vitamin E 400 UNIT capsule Take 400 Units by mouth every morning.   Yes Historical Provider, MD  zolpidem (AMBIEN) 5 MG tablet Take 5 mg by mouth at bedtime. 12/20/14  Yes Historical Provider, MD  diphenhydrAMINE (BENADRYL) 25 MG tablet Take 1 tablet (25 mg total) by mouth every 6 (six) hours. Patient not taking: Reported on 04/21/2015 01/27/15   Ottie Glazier, PA-C  famotidine (PEPCID) 20 MG tablet Take 1 tablet (20 mg total) by mouth 2 (two) times daily. 01/27/15   Hanna Patel-Mills, PA-C  methocarbamol (ROBAXIN) 500 MG tablet Take 1 tablet (500 mg total) by mouth every 6 (six) hours as needed for muscle spasms. Patient not taking: Reported on 04/14/2015 10/13/14   Ardeen Jourdain, PA-C  oxyCODONE-acetaminophen (PERCOCET/ROXICET) 5-325 MG per tablet Take 1-2 tablets by mouth every 4 (four) hours as needed for moderate pain. Patient not taking: Reported on 04/21/2015 10/13/14   Amber Constable, PA-C   BP 140/71 mmHg  Pulse 92  Temp(Src) 97.8 F (36.6 C) (Axillary)  Resp 16  Ht   SpO2 98% Physical Exam  Constitutional: No distress.  Agitated and restless  HENT:  Head: Normocephalic and atraumatic.  Mouth/Throat: Oropharynx is clear and moist.  Eyes:  Pupils 4 mm reactive bilaterally  Cardiovascular: Normal rate, regular rhythm and normal heart sounds.   No murmur heard. Pulmonary/Chest: Effort normal and breath sounds  normal. No respiratory distress. She has no wheezes.  Abdominal: Soft. Bowel sounds are normal. There is no tenderness. There is no rebound.  Musculoskeletal: She exhibits no edema.  Neurological: She is alert.  Oriented only to self, moves all 4 extremities spontaneously  Skin: Skin is warm and dry.  Psychiatric:  Very agitated  Nursing note and vitals reviewed.   ED Course  Procedures (including critical care time) Labs Review Labs Reviewed  ACETAMINOPHEN LEVEL - Abnormal; Notable for the following:    Acetaminophen (Tylenol), Serum <10 (*)    All other components within normal limits  CBC - Abnormal; Notable for the following:    RBC 3.22 (*)    Hemoglobin 10.7 (*)    HCT 32.7 (*)    MCV 101.6 (*)    RDW 16.5 (*)    All other components within normal limits  COMPREHENSIVE METABOLIC PANEL -  Abnormal; Notable for the following:    Total Protein 8.2 (*)    AST 76 (*)    Alkaline Phosphatase 349 (*)    All other components within normal limits  URINE RAPID DRUG SCREEN, HOSP PERFORMED - Abnormal; Notable for the following:    Opiates POSITIVE (*)    All other components within normal limits  URINALYSIS, ROUTINE W REFLEX MICROSCOPIC (NOT AT [facility]) - Abnormal; Notable for the following:    Color, Urine ORANGE (*)    APPearance TURBID (*)    Protein, ur 30 (*)    Nitrite POSITIVE (*)    Leukocytes, UA LARGE (*)    All other components within normal limits  URINE MICROSCOPIC-ADD ON - Abnormal; Notable for the following:    Bacteria, UA MANY (*)    All other components within normal limits  URINE CULTURE  CULTURE, BLOOD (ROUTINE X 2)  CULTURE, BLOOD (ROUTINE X 2)  ETHANOL  SALICYLATE LEVEL    Imaging Review No results found.   EKG Interpretation None      MDM   Final diagnoses:  Altered mental status, unspecified altered mental status type  UTI (lower urinary tract infection)    Patient presents with erratic behavior and altered mental status from home.  Afebrile and nontoxic. Patient does appear very agitated and restless. She is only oriented to herself and unable to provide additional history.  Lab work notable for a nitrite positive UTI with too numerous to count white cells. No leukocytosis. No evidence of systemic inflammatory response. CT scan head pending. Suspect altered mental status is secondary to urinary tract infection. Urine culture pending and patient given IV Rocephin. Will admit given mental status changes.    Merryl Hacker, MD 04/22/15 [phone]

## 2015-04-22 NOTE — Progress Notes (Signed)
Notified Dr. Wendee Beavers pt request to have nicotine patch.  Pt stated she smokes 3 cigarettes a day.  Waiting for response.  Pt resting comfortably.  Will continue to monitor.  Iantha Fallen RN 7:35 PM 04/22/2015

## 2015-04-22 NOTE — ED Notes (Signed)
Ativan to be given, then transport to to Head CT, then culture collection.

## 2015-04-23 MED ORDER — CEFDINIR 300 MG PO CAPS: 300.0000 mg | ORAL_CAPSULE | Freq: Two times a day (BID) | ORAL | Status: DC

## 2015-04-23 NOTE — Progress Notes (Signed)
Pt discharged to home. DC instructions given with granddaughter  at bedside. Prescription x 1 given for PO antibiotics. Pt's med retrieved from pharmacy and given to pt. No concerns voiced. Left unit in wheelchair pushed by nurse tech accompanied by granddaughter, who is transporting pt home. Left in good condition. Vwilliams,rn.

## 2015-04-23 NOTE — Discharge Summary (Signed)
Physician Discharge Summary  HOLLE SPRICK EXH:[id number] DOB: 1946/09/12 DOA: 04/21/2015  PCP: Kevan Ny, MD  Admit date: 04/21/2015 Discharge date: 04/23/2015  Time spent: > 35 minutes  Recommendations for Outpatient Follow-up:  1. Please encourage alcohol cessation  Discharge Diagnoses:  Principal Problem:   Acute delirium Active Problems:   UTI (lower urinary tract infection)   Discharge Condition: stable  Diet recommendation: regular diet  There were no vitals filed for this visit.  History of present illness:  From original history of present illness: 69 y.o. female  Medical information obtained from chart as patient presents with altered mental status. She has a history of GERD, hypercholesterolemia, renal cell carcinoma, arthritis. She was brought to the hospital by EMS due to erratic behavior at home as started earlier today.  Hospital Course:  Metabolic encephalopathy - Most likely secondary to alcohol use given her elevated blood alcohol level upon initial evaluation. - Resolved and patient back at baseline  Urinary tract infection - Will discharge on Omnicef to complete a three-day treatment course  Procedures:  None  Consultations:  None  Discharge Exam: Filed Vitals:   04/23/15 0639  BP: 108/59  Pulse: 81  Temp: 97.9 F (36.6 C)  Resp: 16    General: Pt in nad, alert and awake Cardiovascular: rrr, no mrg Respiratory: cta bl, no wheezes  Discharge Instructions   Discharge Instructions    Call MD for:  extreme fatigue    Complete by:  As directed      Call MD for:  severe uncontrolled pain    Complete by:  As directed      Call MD for:  temperature >100.4    Complete by:  As directed      Diet - low sodium heart healthy    Complete by:  As directed      Discharge instructions    Complete by:  As directed   Please avoid any alcohol consumption     Increase activity slowly    Complete by:  As directed           Current Discharge  Medication List    START taking these medications   Details  cefdinir (OMNICEF) 300 MG capsule Take 1 capsule (300 mg total) by mouth 2 (two) times daily. Qty: 6 capsule, Refills: 0      CONTINUE these medications which have NOT CHANGED   Details  albuterol (PROVENTIL HFA;VENTOLIN HFA) 108 (90 BASE) MCG/ACT inhaler Inhale 2 puffs into the lungs every 6 (six) hours as needed for wheezing or shortness of breath.     calcium-vitamin D (OSCAL WITH D) 500-200 MG-UNIT per tablet Take 2 tablets by mouth 2 (two) times daily.     cholecalciferol (VITAMIN D) 1000 UNITS tablet Take 1,000 Units by mouth every morning.    citalopram (CELEXA) 10 MG tablet Take 10 mg by mouth every morning.    DULoxetine (CYMBALTA) 60 MG capsule Take 60 mg by mouth daily. Refills: 2    Fluticasone-Salmeterol (ADVAIR) 500-50 MCG/DOSE AEPB Inhale 1 puff into the lungs 2 (two) times daily.    KLOR-CON M20 20 MEQ tablet Take 20 mEq by mouth 2 (two) times daily with a meal. Refills: 2    Magnesium Oxide 400 (240 MG) MG TABS Take 1 tablet by mouth daily. Refills: 3    meloxicam (MOBIC) 15 MG tablet Take 15 mg by mouth daily. Refills: 2    mupirocin cream (BACTROBAN) 2 % Apply 1 application topically 2 (two) times  daily as needed (rash).    pantoprazole (PROTONIX) 20 MG tablet Take 1 tablet (20 mg total) by mouth daily. Qty: 7 tablet, Refills: 0    simvastatin (ZOCOR) 40 MG tablet Take 40 mg by mouth every morning.    vitamin C (ASCORBIC ACID) 500 MG tablet Take 1 mg by mouth every morning.    vitamin E 400 UNIT capsule Take 400 Units by mouth every morning.    zolpidem (AMBIEN) 5 MG tablet Take 5 mg by mouth at bedtime. Refills: 2    famotidine (PEPCID) 20 MG tablet Take 1 tablet (20 mg total) by mouth 2 (two) times daily. Qty: 20 tablet, Refills: 0    methocarbamol (ROBAXIN) 500 MG tablet Take 1 tablet (500 mg total) by mouth every 6 (six) hours as needed for muscle spasms. Qty: 40 tablet, Refills: 1       STOP taking these medications     oxyCODONE-acetaminophen (PERCOCET) 5-325 MG per tablet      traMADol (ULTRAM) 50 MG tablet      diphenhydrAMINE (BENADRYL) 25 MG tablet      oxyCODONE-acetaminophen (PERCOCET/ROXICET) 5-325 MG per tablet        Allergies  Allergen Reactions  . Azithromycin Itching and Swelling      The results of significant diagnostics from this hospitalization (including imaging, microbiology, ancillary and laboratory) are listed below for reference.    Significant Diagnostic Studies: Ct Head Wo Contrast  04/22/2015   CLINICAL DATA:  Altered mental status, erratic behavior for 3 days. Status post fall April 14, 2015.  EXAM: CT HEAD WITHOUT CONTRAST  TECHNIQUE: Contiguous axial images were obtained from the base of the skull through the vertex without intravenous contrast.  COMPARISON:  CT head April 14, 2015  FINDINGS: The ventricles and sulci are normal for age. Mild advanced cerebellar volume loss. No intraparenchymal hemorrhage, mass effect nor midline shift. Patchy supratentorial white matter hypodensities are within normal range for patient's age and though non-specific suggest sequelae of chronic small vessel ischemic disease. No acute large vascular territory infarcts.  No abnormal extra-axial fluid collections. Basal cisterns are patent. Moderate calcific atherosclerosis of the carotid siphons.  Small RIGHT frontal scalp hematoma persists. No skull fracture. The included ocular globes and orbital contents are non-suspicious. Mild LEFT posterior ethmoid mucosal thickening without air-fluid levels. The mastoid air cells and middle ears appear well aerated.  IMPRESSION: No acute intracranial process.  Small RIGHT frontal scalp hematoma.  No skull fracture.  Mild disproportionate cerebellar volume loss.   Electronically Signed   By: Elon Alas M.D.   On: 04/22/2015 02:53   Ct Head Wo Contrast  04/14/2015   CLINICAL DATA:  Near syncope. Alcohol intoxication.  Right forehead hematoma.  EXAM: CT HEAD WITHOUT CONTRAST  CT CERVICAL SPINE WITHOUT CONTRAST  TECHNIQUE: Multidetector CT imaging of the head and cervical spine was performed following the standard protocol without intravenous contrast. Multiplanar CT image reconstructions of the cervical spine were also generated.  COMPARISON:  Head CT 12/03/2010  FINDINGS: CT HEAD FINDINGS  No intracranial hemorrhage, mass effect, or midline shift. Atrophy is progressed from prior exam, advanced for age. No hydrocephalus. The basilar cisterns are patent. No evidence of territorial infarct. No intracranial fluid collection. Right frontal scalp hematoma without subjacent fracture. Calvarium is intact. Included paranasal sinuses are well aerated. Minimal opacification of lower left mastoid air cells. Right mastoid air cells are well aerated.  CT CERVICAL SPINE FINDINGS  Mild straightening of normal lordosis. There is trace  retrolisthesis of C3 on C4, and mild anterolisthesis of C7 on T1 that appears degenerative. Vertebral body heights are preserved. There is no fracture. The dens is intact. There are no jumped or perched facets. Multilevel disc space narrowing most significant at C3-C4. Multilevel facet arthropathy. No prevertebral soft tissue edema. There is biapical pleural parenchymal scarring at the lung apices. Retained secretions noted in the trachea.  IMPRESSION: 1. Right frontal scalp hematoma without fracture or acute intracranial abnormality. 2. Progressive atrophy from prior exam, advanced for age. 3. Multilevel degenerative change throughout the cervical spine without acute fracture or subluxation.   Electronically Signed   By: Jeb Levering M.D.   On: 04/14/2015 03:50   Ct Cervical Spine Wo Contrast  04/14/2015   CLINICAL DATA:  Near syncope. Alcohol intoxication. Right forehead hematoma.  EXAM: CT HEAD WITHOUT CONTRAST  CT CERVICAL SPINE WITHOUT CONTRAST  TECHNIQUE: Multidetector CT imaging of the head and  cervical spine was performed following the standard protocol without intravenous contrast. Multiplanar CT image reconstructions of the cervical spine were also generated.  COMPARISON:  Head CT 12/03/2010  FINDINGS: CT HEAD FINDINGS  No intracranial hemorrhage, mass effect, or midline shift. Atrophy is progressed from prior exam, advanced for age. No hydrocephalus. The basilar cisterns are patent. No evidence of territorial infarct. No intracranial fluid collection. Right frontal scalp hematoma without subjacent fracture. Calvarium is intact. Included paranasal sinuses are well aerated. Minimal opacification of lower left mastoid air cells. Right mastoid air cells are well aerated.  CT CERVICAL SPINE FINDINGS  Mild straightening of normal lordosis. There is trace retrolisthesis of C3 on C4, and mild anterolisthesis of C7 on T1 that appears degenerative. Vertebral body heights are preserved. There is no fracture. The dens is intact. There are no jumped or perched facets. Multilevel disc space narrowing most significant at C3-C4. Multilevel facet arthropathy. No prevertebral soft tissue edema. There is biapical pleural parenchymal scarring at the lung apices. Retained secretions noted in the trachea.  IMPRESSION: 1. Right frontal scalp hematoma without fracture or acute intracranial abnormality. 2. Progressive atrophy from prior exam, advanced for age. 3. Multilevel degenerative change throughout the cervical spine without acute fracture or subluxation.   Electronically Signed   By: Jeb Levering M.D.   On: 04/14/2015 03:50    Microbiology: Recent Results (from the past 240 hour(s))  Urine culture     Status: None (Preliminary result)   Collection Time: 04/22/15 12:20 AM  Result Value Ref Range Status   Specimen Description URINE, RANDOM  Final   Special Requests NONE  Final   Colony Count   Final    >=100,000 COLONIES/ML Performed at [facility]    Culture   Final    ESCHERICHIA  COLI Performed at [facility]    Report Status PENDING  Incomplete  Blood culture (routine x 2)     Status: None (Preliminary result)   Collection Time: 04/22/15  2:20 AM  Result Value Ref Range Status   Specimen Description BLOOD RIGHT ANTECUBITAL  Final   Special Requests BOTTLES DRAWN AEROBIC AND ANAEROBIC 5CC  Final   Culture   Final           BLOOD CULTURE RECEIVED NO GROWTH TO DATE CULTURE WILL BE HELD FOR 5 DAYS BEFORE ISSUING A FINAL NEGATIVE REPORT Performed at [facility]    Report Status PENDING  Incomplete  Blood culture (routine x 2)     Status: None (Preliminary result)   Collection Time:  04/22/15  2:20 AM  Result Value Ref Range Status   Specimen Description BLOOD RIGHT FOREARM  Final   Special Requests BOTTLES DRAWN AEROBIC AND ANAEROBIC 5CC  Final   Culture   Final           BLOOD CULTURE RECEIVED NO GROWTH TO DATE CULTURE WILL BE HELD FOR 5 DAYS BEFORE ISSUING A FINAL NEGATIVE REPORT Performed at [facility]    Report Status PENDING  Incomplete     Labs: Basic Metabolic Panel:  Recent Labs Lab 04/21/15 2319 04/22/15 0540  NA 142 141  K 3.6 3.6  CL 103 104  CO2 26 25  GLUCOSE 90 95  BUN 11 12  CREATININE 0.62 0.55  CALCIUM 9.6 9.3   Liver Function Tests:  Recent Labs Lab 04/21/15 2319  AST 76*  ALT 46  ALKPHOS 349*  BILITOT 0.7  PROT 8.2*  ALBUMIN 4.0   No results for input(s): LIPASE, AMYLASE in the last 168 hours. No results for input(s): AMMONIA in the last 168 hours. CBC:  Recent Labs Lab 04/21/15 2319 04/22/15 0540  WBC 7.1 8.8  HGB 10.7* 10.4*  HCT 32.7* 31.5*  MCV 101.6* 100.3*  PLT 258 223   Cardiac Enzymes: No results for input(s): CKTOTAL, CKMB, CKMBINDEX, TROPONINI in the last 168 hours. BNP: BNP (last 3 results) No results for input(s): BNP in the last 8760 hours.  ProBNP (last 3 results) No results for input(s): PROBNP in the last 8760 hours.  CBG: No results for input(s): GLUCAP in  the last 168 hours.     Signed:  Velvet Bathe  [facility] 04/23/2015, 2:36 PM

## 2015-04-24 LAB — URINE CULTURE

## 2015-04-28 LAB — CULTURE, BLOOD (ROUTINE X 2)
Culture: NO GROWTH
Culture: NO GROWTH

## 2015-05-03 ENCOUNTER — Other Ambulatory Visit (INDEPENDENT_AMBULATORY_CARE_PROVIDER_SITE_OTHER): Payer: Medicare HMO

## 2015-05-03 ENCOUNTER — Ambulatory Visit (INDEPENDENT_AMBULATORY_CARE_PROVIDER_SITE_OTHER): Payer: Medicare HMO | Admitting: Family

## 2015-05-03 ENCOUNTER — Encounter: Payer: Self-pay | Admitting: Family

## 2015-05-03 VITALS — BP 140/82 | HR 100 | Temp 98.9°F | Resp 18 | Ht 68.0 in | Wt 139.1 lb

## 2015-05-03 DIAGNOSIS — N951 Menopausal and female climacteric states: Secondary | ICD-10-CM

## 2015-05-03 DIAGNOSIS — R296 Repeated falls: Secondary | ICD-10-CM

## 2015-05-03 DIAGNOSIS — E785 Hyperlipidemia, unspecified: Secondary | ICD-10-CM | POA: Diagnosis not present

## 2015-05-03 DIAGNOSIS — Z79899 Other long term (current) drug therapy: Secondary | ICD-10-CM

## 2015-05-03 DIAGNOSIS — R232 Flushing: Secondary | ICD-10-CM

## 2015-05-03 LAB — COMPREHENSIVE METABOLIC PANEL
ALK PHOS: 294 U/L — AB (ref 39–117)
ALT: 17 U/L (ref 0–35)
AST: 32 U/L (ref 0–37)
Albumin: 3.8 g/dL (ref 3.5–5.2)
BILIRUBIN TOTAL: 0.4 mg/dL (ref 0.2–1.2)
BUN: 9 mg/dL (ref 6–23)
CO2: 27 mEq/L (ref 19–32)
Calcium: 9.8 mg/dL (ref 8.4–10.5)
Chloride: 100 mEq/L (ref 96–112)
Creatinine, Ser: 0.76 mg/dL (ref 0.40–1.20)
GFR: 97.72 mL/min (ref >60.00)
Glucose, Bld: 82 mg/dL (ref 70–99)
Potassium: 3.9 mEq/L (ref 3.5–5.1)
SODIUM: 135 meq/L (ref 135–145)
TOTAL PROTEIN: 8.4 g/dL — AB (ref 6.0–8.3)

## 2015-05-03 NOTE — Assessment & Plan Note (Signed)
Patient with history of multiple falls in the last 9 months. She is currently undergoing physical therapy to strengthen and work on balance. Indicates that she does have handrails in the bathroom and fall risk has been decreased as clutter has been removed and there are no throw rugs in her house. Continue to monitor at this time. Medication reviewed and discontinued Celexa and baclofen. Concern for use of muscle relaxers and risk for falls. Patient requesting narcotic pain medication, however discussed risks of increased fall and how likely this would not result in much improvement. Continue current medications as prescribed. Follow up in 1 month.

## 2015-05-03 NOTE — Assessment & Plan Note (Addendum)
Patient describes hot flashes and mood swings which were previously controlled with Premarin creams. Notes her symptoms have worsened since it was discontinued. Refer to gynecology for possible hormone replacement therapy with Premarin. Obtain TSH and comprehensive metabolic panel to rule out metabolic causes.

## 2015-05-03 NOTE — Assessment & Plan Note (Signed)
Reviewed and discussed patient's current prescription medications. Determined overlap between baclofen and methocarbamol. Discontinue baclofen. Also notes she is no longer having anxiety related attacks. Discontinue Celexa. We will work to wean off of Cymbalta following Celexa discontinuation. Continue other medications as prescribed at this time.

## 2015-05-03 NOTE — Patient Instructions (Signed)
Thank you for choosing [facility].  Summary/Instructions:  Your prescription(s) have been submitted to your pharmacy or been printed and provided for you. Please take as directed and contact our office if you believe you are having problem(s) with the medication(s) or have any questions.  Please stop by the lab on the basement level of the building for your blood work. Your results will be released to [place] (or called to you) after review, usually within 72 hours after test completion. If any changes need to be made, you will be notified at that same time.  Referrals have been made during this visit. You should expect to hear back from our schedulers in about 7-10 days in regards to establishing an appointment with the specialists we discussed.   If your symptoms worsen or fail to improve, please contact our office for further instruction, or in case of emergency go directly to the emergency room at the closest medical facility.    Menopause Menopause is the normal time of life when menstrual periods stop completely. Menopause is complete when you have missed 12 consecutive menstrual periods. It usually occurs between the ages of 38 years and 40 years. Very rarely does a woman develop menopause before the age of 32 years. At menopause, your ovaries stop producing the female hormones estrogen and progesterone. This can cause undesirable symptoms and also affect your health. Sometimes the symptoms may occur 4-5 years before the menopause begins. There is no relationship between menopause and:  Oral contraceptives.  Number of children you had.  Race.  The age your menstrual periods started (menarche). Heavy smokers and very thin women may develop menopause earlier in life. CAUSES  The ovaries stop producing the female hormones estrogen and progesterone.  Other causes include:  Surgery to remove both ovaries.  The ovaries stop functioning for no known reason.  Tumors of the  pituitary gland in the brain.  Medical disease that affects the ovaries and hormone production.  Radiation treatment to the abdomen or pelvis.  Chemotherapy that affects the ovaries. SYMPTOMS   Hot flashes.  Night sweats.  Decrease in sex drive.  Vaginal dryness and thinning of the vagina causing painful intercourse.  Dryness of the skin and developing wrinkles.  Headaches.  Tiredness.  Irritability.  Memory problems.  Weight gain.  Bladder infections.  Hair growth of the face and chest.  Infertility. More serious symptoms include:  Loss of bone (osteoporosis) causing breaks (fractures).  Depression.  Hardening and narrowing of the arteries (atherosclerosis) causing heart attacks and strokes. DIAGNOSIS   When the menstrual periods have stopped for 12 straight months.  Physical exam.  Hormone studies of the blood. TREATMENT  There are many treatment choices and nearly as many questions about them. The decisions to treat or not to treat menopausal changes is an individual choice made with your health care provider. Your health care provider can discuss the treatments with you. Together, you can decide which treatment will work best for you. Your treatment choices may include:   Hormone therapy (estrogen and progesterone).  Non-hormonal medicines.  Treating the individual symptoms with medicine (for example antidepressants for depression).  Herbal medicines that may help specific symptoms.  Counseling by a psychiatrist or psychologist.  Group therapy.  Lifestyle changes including:  Eating healthy.  Regular exercise.  Limiting caffeine and alcohol.  Stress management and meditation.  No treatment. HOME CARE INSTRUCTIONS   Take the medicine your health care provider gives you as directed.  Get plenty of  sleep and rest.  Exercise regularly.  Eat a diet that contains calcium (good for the bones) and soy products (acts like estrogen  hormone).  Avoid alcoholic beverages.  Do not smoke.  If you have hot flashes, dress in layers.  Take supplements, calcium, and vitamin D to strengthen bones.  You can use over-the-counter lubricants or moisturizers for vaginal dryness.  Group therapy is sometimes very helpful.  Acupuncture may be helpful in some cases. SEEK MEDICAL CARE IF:   You are not sure you are in menopause.  You are having menopausal symptoms and need advice and treatment.  You are still having menstrual periods after age 90 years.  You have pain with intercourse.  Menopause is complete (no menstrual period for 12 months) and you develop vaginal bleeding.  You need a referral to a specialist (gynecologist, psychiatrist, or psychologist) for treatment. SEEK IMMEDIATE MEDICAL CARE IF:   You have severe depression.  You have excessive vaginal bleeding.  You fell and think you have a broken bone.  You have pain when you urinate.  You develop leg or chest pain.  You have a fast pounding heart beat (palpitations).  You have severe headaches.  You develop vision problems.  You feel a lump in your breast.  You have abdominal pain or severe indigestion. Document Released: 01/18/2004 Document Revised: 06/30/2013 Document Reviewed: 05/27/2013 [facility] Patient Information 2015 [place], [place]. This information is not intended to replace advice given to you by your health care provider. Make sure you discuss any questions you have with your health care provider.

## 2015-05-03 NOTE — Progress Notes (Signed)
Subjective:    Patient ID: [name], female    DOB: 1946/05/03, 69 y.o.   MRN: [id number]  Chief Complaint  Patient presents with  . Establish Care    having mood swings and night sweats, has to sleep with a towel, states she had a fall at the beginning of june and has been sore all over her body since, she also states that if there is medicine on her list that she doesn't need she wants to get rid of it     HPI:  [name] is a 69 y.o. female with a PMH of  who presents today for an office visit to establish care.   1.) Mood swings/Night sweats - Associated symptoms of mood swings and night sweats has been going on for about 3 years. Modifying factors include Premarin cream which she indicates helped to regulate her symptoms. Indicates that she has not received.   2.) Multiple falls - Associated symptoms of multiple falls have occurred in the last several months. May have fallen about 12-13 times in the last 9 months with the most recent being in the beginning of June. Currently working with physical therapy. Notes that she has handle bars in bathroom and does not have any rugs or carpet.   3.) Medication review - patient requesting a review of medications to determine any unnecessary or duplicated medications.  Allergies  Allergen Reactions  . Azithromycin Itching and Swelling     Outpatient Prescriptions Prior to Visit  Medication Sig Dispense Refill  . albuterol (PROVENTIL HFA;VENTOLIN HFA) 108 (90 BASE) MCG/ACT inhaler Inhale 2 puffs into the lungs every 6 (six) hours as needed for wheezing or shortness of breath.     . DULoxetine (CYMBALTA) 60 MG capsule Take 60 mg by mouth daily.  2  . famotidine (PEPCID) 20 MG tablet Take 1 tablet (20 mg total) by mouth 2 (two) times daily. 20 tablet 0  . KLOR-CON M20 20 MEQ tablet Take 20 mEq by mouth 2 (two) times daily with a meal.  2  . Magnesium Oxide 400 (240 MG) MG TABS Take 1 tablet by mouth daily.  3  . meloxicam (MOBIC) 15 MG  tablet Take 15 mg by mouth daily.  2  . methocarbamol (ROBAXIN) 500 MG tablet Take 1 tablet (500 mg total) by mouth every 6 (six) hours as needed for muscle spasms. 40 tablet 1  . simvastatin (ZOCOR) 40 MG tablet Take 40 mg by mouth every morning.    . citalopram (CELEXA) 10 MG tablet Take 10 mg by mouth every morning.    . calcium-vitamin D (OSCAL WITH D) 500-200 MG-UNIT per tablet Take 2 tablets by mouth 2 (two) times daily.     . cefdinir (OMNICEF) 300 MG capsule Take 1 capsule (300 mg total) by mouth 2 (two) times daily. 6 capsule 0  . cholecalciferol (VITAMIN D) 1000 UNITS tablet Take 1,000 Units by mouth every morning.    . Fluticasone-Salmeterol (ADVAIR) 500-50 MCG/DOSE AEPB Inhale 1 puff into the lungs 2 (two) times daily.    . mupirocin cream (BACTROBAN) 2 % Apply 1 application topically 2 (two) times daily as needed (rash).    . pantoprazole (PROTONIX) 20 MG tablet Take 1 tablet (20 mg total) by mouth daily. 7 tablet 0  . vitamin C (ASCORBIC ACID) 500 MG tablet Take 1 mg by mouth every morning.    . vitamin E 400 UNIT capsule Take 400 Units by mouth every morning.    Marland Kitchen  zolpidem (AMBIEN) 5 MG tablet Take 5 mg by mouth at bedtime.  2   No facility-administered medications prior to visit.     Past Medical History  Diagnosis Date  . Seasonal allergies   . Hypercholesteremia     under control  . GERD (gastroesophageal reflux disease)     occasional  . Arthritis     back-severe, hips, right knee  . H/O mumps   . H/O measles   . Insomnia   . Asthma   . Allergy   . Renal cell carcinoma 2012    left     Past Surgical History  Procedure Laterality Date  . Kidney surgery  12/2010    [facility]; partial nephrectomy  . Hernia repair  07/6788    supraumbilical repair  . Bunionectomy  04/2011  . Menisectomy  2010    left knee  . Cholecystectomy  11/13/2011    Procedure: LAPAROSCOPIC CHOLECYSTECTOMY WITH INTRAOPERATIVE CHOLANGIOGRAM;  Surgeon: Judieth Keens, DO;  Location: [facility];   Service: General;  Laterality: N/A;  . Esophagogastroduodenoscopy N/A 09/25/2013    Procedure: ESOPHAGOGASTRODUODENOSCOPY (EGD);  Surgeon: Lear Ng, MD;  Location: [facility];  Service: Endoscopy;  Laterality: N/A;  . Colonoscopy N/A 09/25/2013    Procedure: COLONOSCOPY;  Surgeon: Lear Ng, MD;  Location: [facility];  Service: Endoscopy;  Laterality: N/A;  . Abdominal hysterectomy  40years ago  . Tubal ligation  44 years ago  . Lumbar laminectomy/decompression microdiscectomy Right 10/12/2014    Procedure: HEMI LAMINECTOMY MICRODISCECTOMY L5-S1 RIGHT (1 LEVEL);  Surgeon: Tobi Bastos, MD;  Location: [facility];  Service: Orthopedics;  Laterality: Right;     Family History  Problem Relation Age of Onset  . Hyperlipidemia Mother   . Hypertension Mother      History   Social History  . Marital Status: Divorced    Spouse Name: N/A  . Number of Children: 3  . Years of Education: 12   Occupational History  . Retired    Social History Main Topics  . Smoking status: Current Some Day Smoker -- 0.00 packs/day for 6 years    Types: Cigarettes  . Smokeless tobacco: Never Used  . Alcohol Use: Yes     Comment: socially  . Drug Use: No  . Sexual Activity: No   Other Topics Concern  . Not on file   Social History Narrative   Fun: play cards, play with her grandchildren, cooking   Denies religious beliefs effecting health care.     Review of Systems  Constitutional: Negative for fever and chills.  Gastrointestinal: Negative for nausea, vomiting, abdominal pain and diarrhea.  Neurological: Positive for weakness. Negative for dizziness and light-headedness.  Psychiatric/Behavioral: Negative for sleep disturbance and dysphoric mood. The patient is not nervous/anxious.       Objective:    BP 140/82 mmHg  Pulse 100  Temp(Src) 98.9 F (37.2 C) (Oral)  Resp 18  Ht 5\' 8"  (1.727 m)  Wt 139 lb 1.9 oz (63.104 kg)  BMI 21.16 kg/m2  SpO2 98% Nursing note  and vital signs reviewed.  Physical Exam  Constitutional: She is oriented to person, place, and time. She appears well-developed and well-nourished. No distress.  Cardiovascular: Normal rate, regular rhythm, normal heart sounds and intact distal pulses.   Pulmonary/Chest: Effort normal and breath sounds normal.  Neurological: She is alert and oriented to person, place, and time.  Skin: Skin is warm and dry.  Psychiatric: She has a normal mood and  affect. Her behavior is normal. Judgment and thought content normal.       Assessment & Plan:   Problem List Items Addressed This Visit      Other   Multiple falls - Primary    Patient with history of multiple falls in the last 9 months. She is currently undergoing physical therapy to strengthen and work on balance. Indicates that she does have handrails in the bathroom and fall risk has been decreased as clutter has been removed and there are no throw rugs in her house. Continue to monitor at this time. Medication reviewed and discontinued Celexa and baclofen. Concern for use of muscle relaxers and risk for falls. Patient requesting narcotic pain medication, however discussed risks of increased fall and how likely this would not result in much improvement. Continue current medications as prescribed. Follow up in 1 month.      Relevant Orders   TSH   Comprehensive metabolic panel   Encounter for medication review    Reviewed and discussed patient's current prescription medications. Determined overlap between baclofen and methocarbamol. Discontinue baclofen. Also notes she is no longer having anxiety related attacks. Discontinue Celexa. We will work to wean off of Cymbalta following Celexa discontinuation. Continue other medications as prescribed at this time.      Hot flashes    Patient describes hot flashes and mood swings which were previously controlled with Premarin creams. Notes her symptoms have worsened since it was discontinued. Refer to  gynecology for possible hormone replacement therapy with Premarin. Obtain TSH and comprehensive metabolic panel to rule out metabolic causes.      Relevant Orders   Ambulatory referral to Gynecology   TSH   Comprehensive metabolic panel

## 2015-05-03 NOTE — Progress Notes (Signed)
Pre visit review using our clinic review tool, if applicable. No additional management support is needed unless otherwise documented below in the visit note. 

## 2015-05-04 ENCOUNTER — Encounter: Payer: Self-pay | Admitting: Family

## 2015-05-04 LAB — TSH: TSH: 0.57 u[IU]/mL (ref 0.35–4.50)

## 2015-05-05 ENCOUNTER — Telehealth: Payer: Self-pay | Admitting: Family

## 2015-05-05 NOTE — Telephone Encounter (Signed)
Gave patient information

## 2015-05-05 NOTE — Telephone Encounter (Signed)
Patient states [name] left her a vm recommending a gynecologist.  Patient states she has deleted this vm and is requesting call back with information.

## 2015-05-05 NOTE — Telephone Encounter (Signed)
Please inform the patient that she needs to call [facility] to set up an appointment.

## 2015-05-08 ENCOUNTER — Other Ambulatory Visit: Payer: Self-pay

## 2015-06-01 ENCOUNTER — Ambulatory Visit (INDEPENDENT_AMBULATORY_CARE_PROVIDER_SITE_OTHER): Payer: Medicare HMO | Admitting: Gynecology

## 2015-06-01 ENCOUNTER — Encounter: Payer: Self-pay | Admitting: Gynecology

## 2015-06-01 VITALS — BP 128/84 | Ht 65.0 in | Wt 143.0 lb

## 2015-06-01 DIAGNOSIS — N951 Menopausal and female climacteric states: Secondary | ICD-10-CM

## 2015-06-01 DIAGNOSIS — IMO0002 Reserved for concepts with insufficient information to code with codable children: Secondary | ICD-10-CM | POA: Insufficient documentation

## 2015-06-01 DIAGNOSIS — N941 Dyspareunia: Secondary | ICD-10-CM | POA: Diagnosis not present

## 2015-06-01 MED ORDER — PAROXETINE MESYLATE 7.5 MG PO CAPS: ORAL_CAPSULE | ORAL | Status: DC

## 2015-06-01 NOTE — Progress Notes (Signed)
   Patient is a 69 year old who was referred to our practice from her Medicare physician due to the fact the patient been experiencing worsening vasomotor symptoms. Patient many years ago had a total abdominal hysterectomy with bilateral salpingo-oophorectomy secondary to symptomatic leiomyomatous uteri. She stated that shortly thereafter she had been placed on hormone replacement therapy for which she was on for approximately 10+ years. She has not been on hormone replacement therapy now for several years. She has been smoking over 30 years and currently smokes approximately 2 cigarettes per day according to her. Her PCP has been doing her blood work. Patient states her mammograms and colonoscopies are all up-to-date.  With relation discussion of the women's health initiative study. We discussed the potential risk of breast cancer especially of his been such a long gap from the time that she had been on hormone replacement therapy. Also because of her history of smoking she is a high-risk for DVT and pulmonary embolism. We discussed alternative ways to help with her vasomotor symptoms. We discussed placing her on Brisdelle (Paroxitene) 7.5 mgdaily to help with her vasomotor symptoms. She is no longer on the Celexa. If her insurance does not cover this medication the alternative would be Paroxetine  10 mg which she can take half a tablet daily. Also at the health food store she can by peppermint oral and she can apply 2  dabbs behind each ear 2-3 times a day to help as well. She was also counseled on the detrimental effects of smoking. She'll also use Astro glide as needed during intercourse.  Greater than 50% of the time was spent in counseling and correlation of care with this patient approximately 20 minutes.

## 2015-06-01 NOTE — Patient Instructions (Signed)
Paroxetine capsules What is this medicine? PAROXETINE (pa ROX e teen) is used to treat hot flashes due to menopause. This medicine may be used for other purposes; ask your health care provider or pharmacist if you have questions. COMMON BRAND NAME(S): Brisdelle What should I tell my health care provider before I take this medicine? They need to know if you have any of these conditions: -bleeding disorders -glaucoma -heart disease -kidney disease -liver disease -low levels of sodium in the blood -mania or bipolar disorder -seizures -suicidal thoughts, plans, or attempt; a previous suicide attempt by you or a family member -take MAOIs like Carbex, Eldepryl, Marplan, Nardil, and Parnate -take medicines that treat or prevent blood clots -an unusual or allergic reaction to paroxetine, other medicines, foods, dyes, or preservatives -pregnant or trying to get pregnant -breast-feeding How should I use this medicine? Take this medicine by mouth once daily at bedtime. Follow the directions on the prescription label. This medicine can be taken with or without food. Take your medicine at regular intervals. Do not take your medicine more often than directed. A special MedGuide will be given to you by the pharmacist with each prescription and refill. Be sure to read this information carefully each time. Overdosage: If you think you've taken too much of this medicine contact a poison control center or emergency room at once. Overdosage: If you think you have taken too much of this medicine contact a poison control center or emergency room at once. NOTE: This medicine is only for you. Do not share this medicine with others. What if I miss a dose? If you miss a dose, take it as soon as you can. If it is almost time for your next dose, take only that dose. Do not take double or extra doses. What may interact with this medicine? Do not take this medicine with any of the following  medications: -linezolid -MAOIs like Carbex, Eldepryl, Marplan, Nardil, and Parnate -methylene blue (injected into a vein) -pimozide -thioridazine This medicine may also interact with the following medications: -alcohol -aspirin and aspirin-like medicines -atomoxetine -certain medicines for depression, anxiety, or psychotic disturbances -certain medicines for irregular heart beat like propafenone, flecainide, encainide, and quinidine -certain medicines for migraine headache like almotriptan, eletriptan, frovatriptan, naratriptan, rizatriptan, sumatriptan, zolmitriptan -cimetidine -digoxin -diuretics -fentanyl -fosamprenavir -furazolidone -isoniazid -lithium -medicines that treat or prevent blood clots like warfarin, enoxaparin, and dalteparin -medicines for sleep -NSAIDs, medicines for pain and inflammation, like ibuprofen or naproxen -phenobarbital -phenytoin -procarbazine -rasagiline -ritonavir -supplements like St. John's wort, kava kava, valerian -tamoxifen -tramadol -tryptophan This list may not describe all possible interactions. Give your health care provider a list of all the medicines, herbs, non-prescription drugs, or dietary supplements you use. Also tell them if you smoke, drink alcohol, or use illegal drugs. Some items may interact with your medicine. What should I watch for while using this medicine? Tell your doctor or healthcare professional if your symptoms do not start to get better or if they get worse. Visit your doctor or health care professional for regular checks on your progress. Patients and their families should watch out for new or worsening thoughts of suicide or depression. Also watch out for sudden changes in feelings such as feeling anxious, agitated, panicky, irritable, hostile, aggressive, impulsive, severely restless, overly excited and hyperactive, or not being able to sleep. If this happens, especially at the beginning of treatment or after a  change in dose, call your health care professional. Dennis Bast may get drowsy or dizzy. Do  not drive, use machinery, or do anything that needs mental alertness until you know how this medicine affects you. Do not stand or sit up quickly, especially if you are an older patient. This reduces the risk of dizzy or fainting spells. Alcohol may interfere with the effect of this medicine. Avoid alcoholic drinks. Your mouth may get dry. Chewing sugarless gum, sucking hard candy and drinking plenty of water will help. Contact your doctor if the problem does not go away or is severe. Women should inform their doctor if they wish to become pregnant or think they might be pregnant. There is a potential for serious side effects to an unborn child. Talk to your health care professional or pharmacist for more information. Do not become pregnant while taking this medicine. What side effects may I notice from receiving this medicine? Side effects that you should report to your doctor or health care professional as soon as possible: -allergic reactions like skin rash, itching or hives, swelling of the face, lips, or tongue -changes in emotions or moods -confusion -depression -feeling faint or lightheaded, falls -seizures -suicidal thoughts or actions -unusual bleeding or bruising -unusually weak or tired -weakness Side effects that usually do not require medical attention (Report these to your doctor or health care professional if they continue or are bothersome.): -change in sex drive or performance -fatigue -drowsiness -headache -insomnia -nausea/vomiting -upset stomach This list may not describe all possible side effects. Call your doctor for medical advice about side effects. You may report side effects to FDA at 1-800-FDA-1088. Where should I keep my medicine? Keep out of the reach of children. Store at room temperature between 20 and 25 degrees C (68 and 77 degrees F). Throw away any unused medicine after  the expiration date. NOTE: This sheet is a summary. It may not cover all possible information. If you have questions about this medicine, talk to your doctor, pharmacist, or health care provider.  2015, [employer]. (2012-06-29 12:26:17)

## 2015-06-21 ENCOUNTER — Ambulatory Visit: Payer: Medicare HMO | Admitting: Family

## 2015-06-22 ENCOUNTER — Telehealth: Payer: Self-pay | Admitting: Family

## 2015-06-22 NOTE — Telephone Encounter (Signed)
Patient no showed for acute visit yesterday.  Please advise.

## 2015-06-22 NOTE — Telephone Encounter (Signed)
Ok to reschedule if she calls.

## 2015-06-23 NOTE — Telephone Encounter (Signed)
noted 

## 2015-07-05 ENCOUNTER — Encounter: Payer: Self-pay | Admitting: Family

## 2015-07-05 ENCOUNTER — Ambulatory Visit (INDEPENDENT_AMBULATORY_CARE_PROVIDER_SITE_OTHER): Payer: Medicare HMO | Admitting: Family

## 2015-07-05 VITALS — BP 120/78 | HR 93 | Temp 98.2°F | Resp 18 | Ht 65.0 in | Wt 145.0 lb

## 2015-07-05 DIAGNOSIS — G4700 Insomnia, unspecified: Secondary | ICD-10-CM | POA: Diagnosis not present

## 2015-07-05 DIAGNOSIS — L409 Psoriasis, unspecified: Secondary | ICD-10-CM | POA: Diagnosis not present

## 2015-07-05 DIAGNOSIS — J302 Other seasonal allergic rhinitis: Secondary | ICD-10-CM | POA: Diagnosis not present

## 2015-07-05 DIAGNOSIS — M1990 Unspecified osteoarthritis, unspecified site: Secondary | ICD-10-CM | POA: Diagnosis not present

## 2015-07-05 MED ORDER — TRIAMCINOLONE ACETONIDE 0.1 % EX CREA: 1.0000 "application " | TOPICAL_CREAM | Freq: Two times a day (BID) | CUTANEOUS | Status: DC

## 2015-07-05 MED ORDER — MONTELUKAST SODIUM 10 MG PO TABS: 10.0000 mg | ORAL_TABLET | Freq: Every day | ORAL | Status: DC

## 2015-07-05 MED ORDER — METHOCARBAMOL 500 MG PO TABS
500.0000 mg | ORAL_TABLET | Freq: Four times a day (QID) | ORAL | Status: DC | PRN
Start: 2015-07-05 — End: 2015-09-08

## 2015-07-05 MED ORDER — MELOXICAM 15 MG PO TABS: 15.0000 mg | ORAL_TABLET | Freq: Every day | ORAL | Status: DC

## 2015-07-05 MED ORDER — ZOLPIDEM TARTRATE 5 MG PO TABS: 5.0000 mg | ORAL_TABLET | Freq: Every evening | ORAL | Status: DC | PRN

## 2015-07-05 NOTE — Patient Instructions (Signed)
Thank you for choosing [facility].  Summary/Instructions:  Your prescription(s) have been submitted to your pharmacy or been printed and provided for you. Please take as directed and contact our office if you believe you are having problem(s) with the medication(s) or have any questions.  If your symptoms worsen or fail to improve, please contact our office for further instruction, or in case of emergency go directly to the emergency room at the closest medical facility.    Allergic Rhinitis Allergic rhinitis is when the mucous membranes in the nose respond to allergens. Allergens are particles in the air that cause your body to have an allergic reaction. This causes you to release allergic antibodies. Through a chain of events, these eventually cause you to release histamine into the blood stream. Although meant to protect the body, it is this release of histamine that causes your discomfort, such as frequent sneezing, congestion, and an itchy, runny nose.  CAUSES  Seasonal allergic rhinitis (hay fever) is caused by pollen allergens that may come from grasses, trees, and weeds. Year-round allergic rhinitis (perennial allergic rhinitis) is caused by allergens such as house dust mites, pet dander, and mold spores.  SYMPTOMS   Nasal stuffiness (congestion).  Itchy, runny nose with sneezing and tearing of the eyes. DIAGNOSIS  Your health care provider can help you determine the allergen or allergens that trigger your symptoms. If you and your health care provider are unable to determine the allergen, skin or blood testing may be used. TREATMENT  Allergic rhinitis does not have a cure, but it can be controlled by:  Medicines and allergy shots (immunotherapy).  Avoiding the allergen. Hay fever may often be treated with antihistamines in pill or nasal spray forms. Antihistamines block the effects of histamine. There are over-the-counter medicines that may help with nasal congestion and  swelling around the eyes. Check with your health care provider before taking or giving this medicine.  If avoiding the allergen or the medicine prescribed do not work, there are many new medicines your health care provider can prescribe. Stronger medicine may be used if initial measures are ineffective. Desensitizing injections can be used if medicine and avoidance does not work. Desensitization is when a patient is given ongoing shots until the body becomes less sensitive to the allergen. Make sure you follow up with your health care provider if problems continue. HOME CARE INSTRUCTIONS It is not possible to completely avoid allergens, but you can reduce your symptoms by taking steps to limit your exposure to them. It helps to know exactly what you are allergic to so that you can avoid your specific triggers. SEEK MEDICAL CARE IF:   You have a fever.  You develop a cough that does not stop easily (persistent).  You have shortness of breath.  You start wheezing.  Symptoms interfere with normal daily activities. Document Released: 07/23/2001 Document Revised: 11/02/2013 Document Reviewed: 07/05/2013 [facility] Patient Information 2015 [place], [place]. This information is not intended to replace advice given to you by your health care provider. Make sure you discuss any questions you have with your health care provider.

## 2015-07-05 NOTE — Assessment & Plan Note (Signed)
Seasonal allergies remain labile with Flonase. Start 2nd generation OTC antihistamine and singulair. Follow up if symptoms worsen or not improved with new changes.

## 2015-07-05 NOTE — Progress Notes (Signed)
Subjective:    Patient ID: [name], female    DOB: 1946/01/03, 69 y.o.   MRN: [id number]  Chief Complaint  Patient presents with  . Follow-up    Complaining of insomnia, says that [place] helped her when she was prescribed it, athritis in her bad in her back, when she wakes up in the morning she can barely walk, wants to know how to get rid of some of the pain, also wants to see about getting a mucle relaxant    HPI:  [name] is a 69 y.o. female with a PMH of asthma, seasonal allergies, arthritis, hyperlipidemia, hot flashes, and herniated lumbar disc who presents today for a follow up office visit.  Her husband is present for today's visit with her permission.  1.) Insomnia - Continues to experience the associated symptom of insomnia. Indicates that she experiences the inability to fall asleep. This is a chronic problem. Previously noted Ambien that has helped with her symptoms but does not currently have the medication. Severity of the symptoms is enough to keep her up for 2-3 days at a time. Has worked night shift for approximately 30 years. Denies sleeping during the day or taking naps. Husband denies snoring.   2.) Arthritis - Previously diagnosed with arthritis and has had back surgery. Pain is located in her back. She was previously maintained on meloxicam. Not sure if she is taking it. Was also previously maintained on a muscle relaxor to help with her back that did work as well. Functionally is able to complete her activities of daily living and instrumental activities of daily living with some discomfort.   3.) Seasonal allergies - Currently maintained on flonase. Indicates that her seasonal allergies have been worsening and are not well controlled with Flonase.  Describes itchy, watery eyes, sneezing, and itchiness. Has not taken any other OTC medications.    Allergies  Allergen Reactions  . Azithromycin Itching and Swelling    Current Outpatient Prescriptions on File  Prior to Visit  Medication Sig Dispense Refill  . albuterol (PROVENTIL HFA;VENTOLIN HFA) 108 (90 BASE) MCG/ACT inhaler Inhale 2 puffs into the lungs every 6 (six) hours as needed for wheezing or shortness of breath.     . DULoxetine (CYMBALTA) 60 MG capsule Take 60 mg by mouth daily.  2  . famotidine (PEPCID) 20 MG tablet Take 1 tablet (20 mg total) by mouth 2 (two) times daily. 20 tablet 0  . Fluticasone-Salmeterol (ADVAIR) 500-50 MCG/DOSE AEPB Inhale 1 puff into the lungs 2 (two) times daily.    Marland Kitchen KLOR-CON M20 20 MEQ tablet Take 20 mEq by mouth 2 (two) times daily with a meal.  2  . Magnesium Oxide 400 (240 MG) MG TABS Take 1 tablet by mouth daily.  3  . simvastatin (ZOCOR) 40 MG tablet Take 40 mg by mouth every morning.     No current facility-administered medications on file prior to visit.    Past Medical History  Diagnosis Date  . Seasonal allergies   . Hypercholesteremia     under control  . GERD (gastroesophageal reflux disease)     occasional  . Arthritis     back-severe, hips, right knee  . H/O mumps   . H/O measles   . Insomnia   . Asthma   . Allergy   . Renal cell carcinoma 2012    left     Review of Systems  Constitutional: Negative for fever and chills.  HENT: Positive for  rhinorrhea and sneezing. Negative for congestion, sore throat and voice change.   Eyes: Positive for itching.  Respiratory: Negative for cough, chest tightness and shortness of breath.   Cardiovascular: Negative for chest pain, palpitations and leg swelling.  Neurological: Negative for headaches.  Psychiatric/Behavioral: Positive for sleep disturbance.      Objective:    BP 120/78 mmHg  Pulse 93  Temp(Src) 98.2 F (36.8 C) (Oral)  Resp 18  Ht 5\' 5"  (1.651 m)  Wt 145 lb (65.772 kg)  BMI 24.13 kg/m2  SpO2 99% Nursing note and vital signs reviewed.  Physical Exam  Constitutional: She is oriented to person, place, and time. She appears well-developed and well-nourished. No distress.   HENT:  Right Ear: Hearing, tympanic membrane, external ear and ear canal normal.  Left Ear: Hearing, tympanic membrane, external ear and ear canal normal.  Nose: Right sinus exhibits no maxillary sinus tenderness and no frontal sinus tenderness. Left sinus exhibits no maxillary sinus tenderness and no frontal sinus tenderness.  Mouth/Throat: Uvula is midline, oropharynx is clear and moist and mucous membranes are normal.  Cardiovascular: Normal rate, regular rhythm, normal heart sounds and intact distal pulses.   Pulmonary/Chest: Effort normal and breath sounds normal.  Neurological: She is alert and oriented to person, place, and time.  Skin: Skin is warm and dry.  Psychiatric: She has a normal mood and affect. Her behavior is normal. Judgment and thought content normal.       Assessment & Plan:   Problem List Items Addressed This Visit      Respiratory   Seasonal allergies - Primary    Seasonal allergies remain labile with Flonase. Start 2nd generation OTC antihistamine and singulair. Follow up if symptoms worsen or not improved with new changes.       Relevant Medications   montelukast (SINGULAIR) 10 MG tablet     Musculoskeletal and Integument   Arthritis    Continues to experience pain from her arthritis. Discussed possibly trying glucosamine and chondroitin to support joints. Restart meloxicam and robaxin. Continue home therapy with stretching and ice/heat as needed. Follow up for worsening of symptoms.       Relevant Medications   meloxicam (MOBIC) 15 MG tablet   methocarbamol (ROBAXIN) 500 MG tablet   Psoriasis    Requests refill of triamcinolone cream for her psoriasis.       Relevant Medications   triamcinolone cream (KENALOG) 0.1 %   Other Relevant Orders   Ambulatory referral to Dermatology     Other   Insomnia    Chronic insomnia refractory to over the counter treatments and previous success with ambien. Start Ambien to assist with insomnia. Emphasized  importance of good sleep hygiene. If Ambien does not provide some relief, recommend referral to sleep specialist.       Relevant Medications   zolpidem (AMBIEN) 5 MG tablet

## 2015-07-05 NOTE — Assessment & Plan Note (Signed)
Requests refill of triamcinolone cream for her psoriasis.

## 2015-07-05 NOTE — Progress Notes (Signed)
Pre visit review using our clinic review tool, if applicable. No additional management support is needed unless otherwise documented below in the visit note. 

## 2015-07-05 NOTE — Assessment & Plan Note (Signed)
Continues to experience pain from her arthritis. Discussed possibly trying glucosamine and chondroitin to support joints. Restart meloxicam and robaxin. Continue home therapy with stretching and ice/heat as needed. Follow up for worsening of symptoms.

## 2015-07-05 NOTE — Assessment & Plan Note (Signed)
Chronic insomnia refractory to over the counter treatments and previous success with ambien. Start Ambien to assist with insomnia. Emphasized importance of good sleep hygiene. If Ambien does not provide some relief, recommend referral to sleep specialist.

## 2015-07-24 ENCOUNTER — Ambulatory Visit (INDEPENDENT_AMBULATORY_CARE_PROVIDER_SITE_OTHER): Payer: Medicare HMO | Admitting: Family

## 2015-07-24 ENCOUNTER — Encounter: Payer: Self-pay | Admitting: Family

## 2015-07-24 VITALS — BP 138/80 | HR 102 | Temp 98.4°F | Resp 18 | Ht 65.0 in | Wt 144.0 lb

## 2015-07-24 DIAGNOSIS — M5126 Other intervertebral disc displacement, lumbar region: Secondary | ICD-10-CM | POA: Diagnosis not present

## 2015-07-24 DIAGNOSIS — Z23 Encounter for immunization: Secondary | ICD-10-CM

## 2015-07-24 MED ORDER — PREDNISONE 10 MG PO TABS: ORAL_TABLET | ORAL | Status: DC

## 2015-07-24 NOTE — Progress Notes (Signed)
Pre visit review using our clinic review tool, if applicable. No additional management support is needed unless otherwise documented below in the visit note. 

## 2015-07-24 NOTE — Progress Notes (Signed)
Subjective:    Patient ID: [name], female    DOB: [birth year], 69 y.o.   MRN: [id number]  Chief Complaint  Patient presents with  . Back Pain    has a shooting pain going down left hip and has back pain, states that she can't walk 10 mins without sitting down     HPI:  [name] is a 69 y.o. female with a PMH of asthma, seasonal allergies, herniated lumbar intervertebral disc, psoriasis, hyperlipidemia, and anemia of chronic disease who presents today for an acute office visit.  1.) Back pain - this is a worsening of a chronic problem. Associated symptom of pain located in her lower left back has been going on for about 3 weeks. Describes the pain as sharp that radiates down her leg and is constant. Severity of the pain is estimated to be about an 8.5/10. Modifying factors include home exercise therapy, robaxin and mobic. Indicates that she has not taken them on a regular basis. Her activity is affected because she can not walk without pain for more than about 10 minutes without having to sit down. Denies any saddle anesthesia or changes to bowel or bladder habits.    Allergies  Allergen Reactions  . Azithromycin Itching and Swelling    Current Outpatient Prescriptions on File Prior to Visit  Medication Sig Dispense Refill  . albuterol (PROVENTIL HFA;VENTOLIN HFA) 108 (90 BASE) MCG/ACT inhaler Inhale 2 puffs into the lungs every 6 (six) hours as needed for wheezing or shortness of breath.     . DULoxetine (CYMBALTA) 60 MG capsule Take 60 mg by mouth daily.  2  . famotidine (PEPCID) 20 MG tablet Take 1 tablet (20 mg total) by mouth 2 (two) times daily. 20 tablet 0  . Fluticasone-Salmeterol (ADVAIR) 500-50 MCG/DOSE AEPB Inhale 1 puff into the lungs 2 (two) times daily.    Marland Kitchen KLOR-CON M20 20 MEQ tablet Take 20 mEq by mouth 2 (two) times daily with a meal.  2  . Magnesium Oxide 400 (240 MG) MG TABS Take 1 tablet by mouth daily.  3  . meloxicam (MOBIC) 15 MG tablet Take 1 tablet (15 mg  total) by mouth daily. 30 tablet 2  . methocarbamol (ROBAXIN) 500 MG tablet Take 1 tablet (500 mg total) by mouth every 6 (six) hours as needed for muscle spasms. 40 tablet 1  . montelukast (SINGULAIR) 10 MG tablet Take 1 tablet (10 mg total) by mouth at bedtime. 30 tablet 3  . simvastatin (ZOCOR) 40 MG tablet Take 40 mg by mouth every morning.    . triamcinolone cream (KENALOG) 0.1 % Apply 1 application topically 2 (two) times daily. 30 g 0  . zolpidem (AMBIEN) 5 MG tablet Take 1 tablet (5 mg total) by mouth at bedtime as needed for sleep. 30 tablet 0   No current facility-administered medications on file prior to visit.    Past Medical History  Diagnosis Date  . Seasonal allergies   . Hypercholesteremia     under control  . GERD (gastroesophageal reflux disease)     occasional  . Arthritis     back-severe, hips, right knee  . H/O mumps   . H/O measles   . Insomnia   . Asthma   . Allergy   . Renal cell carcinoma 2012    left    Review of Systems  Constitutional: Negative for fever and chills.  Musculoskeletal: Positive for back pain.  Neurological: Positive for weakness and numbness.  Objective:    BP 138/80 mmHg  Pulse 102  Temp(Src) 98.4 F (36.9 C) (Oral)  Resp 18  Ht 5\' 5"  (1.651 m)  Wt 144 lb (65.318 kg)  BMI 23.96 kg/m2  SpO2 98% Nursing note and vital signs reviewed.  Physical Exam  Constitutional: She is oriented to person, place, and time. She appears well-developed and well-nourished. No distress.  Cardiovascular: Normal rate, regular rhythm, normal heart sounds and intact distal pulses.   Pulmonary/Chest: Effort normal and breath sounds normal.  Musculoskeletal:  Lumbar spine - excessive lordotic curve noted. No other deformities, discoloration, or edema. Palpable tenderness in lower lumbar region along spinous processes and left paraspinal musculature. Tenderness also elicited over left sciatic nerve. Range of motion is full with discomfort in all  directions. Straight leg raise is negative. Distal pulses and sensation are intact and appropriate.  Neurological: She is alert and oriented to person, place, and time.  Skin: Skin is warm and dry.  Psychiatric: She has a normal mood and affect. Her behavior is normal. Judgment and thought content normal.        Assessment & Plan:   Problem List Items Addressed This Visit      Musculoskeletal and Integument   Herniated lumbar intervertebral disc - Primary    Previously noted to have herniated lumbar disc on CT scan and has completed physical therapy and continues to have pain. Today's pain is consistent with sciatic nerve irritation. Start prednisone. Continue current dosage of meloxicam and methocarbamol which she has been inconsistent taking. Continue home exercise therapy. Referral placed to neurosurgery for further assessment and treatment. Follow up if symptoms worsen or fail to improve.      Relevant Medications   predniSONE (DELTASONE) 10 MG tablet   Other Relevant Orders   Ambulatory referral to Neurosurgery    Other Visit Diagnoses    Encounter for immunization

## 2015-07-24 NOTE — Assessment & Plan Note (Signed)
Previously noted to have herniated lumbar disc on CT scan and has completed physical therapy and continues to have pain. Today's pain is consistent with sciatic nerve irritation. Start prednisone. Continue current dosage of meloxicam and methocarbamol which she has been inconsistent taking. Continue home exercise therapy. Referral placed to neurosurgery for further assessment and treatment. Follow up if symptoms worsen or fail to improve.

## 2015-07-24 NOTE — Patient Instructions (Addendum)
Thank you for choosing [facility].  Summary/Instructions:  Your prescription(s) have been submitted to your pharmacy or been printed and provided for you. Please take as directed and contact our office if you believe you are having problem(s) with the medication(s) or have any questions.  6 Day Prednisone Taper Instructions:   Day 1: Two tablets before breakfast, one after lunch, one after dinner, and two at bedtime.  Day 2: One tablet before breakfast, one after lunch, one after dinner, and two at bedtime Day 3: One tablet before breakfast, one after lunch, one after dinner, and one at bedtime Day 4: One tablet before breakfast, one after lunch, and one at bedtime Day 5: One tablet before breakfast and one at bedtime Day 6: One tablet before breakfast  Referrals have been made during this visit. You should expect to hear back from our schedulers in about 7-10 days in regards to establishing an appointment with the specialists we discussed.   If your symptoms worsen or fail to improve, please contact our office for further instruction, or in case of emergency go directly to the emergency room at the closest medical facility.    Herniated Disk A herniated disk occurs when a disk in your spine bulges out too far. This condition is also called a ruptured disk or slipped disk. Your spine (backbone) is made up of bones called vertebrae. Between each pair of vertebrae is an oval disk with a soft, spongy center that acts as a shock absorber when you move. The spongy center is surrounded by a tough outer ring. When you have a herniated disk, the spongy center of the disk bulges out or ruptures through the outer ring. A herniated disk can press on a nerve between your vertebrae and cause pain. A herniated disk can occur anywhere in your back or neck area, but the lower back is the most common spot. CAUSES  In many cases, a herniated disk occurs just from getting older. As you age, the spongy  insides of your disks tend to shrink and dry out. A herniated disk can result from gradual wear and tear. Injury or sudden strain can also cause a herniated disk.  RISK FACTORS Aging is the main risk factor for a herniated disk. Other risk factors include:  Being a man between the ages of 73 and 73 years.  Having a job that requires heavy lifting, bending, or twisting.  Having a job that requires long hours of driving.  Not getting enough exercise.  Being overweight.  Smoking. SIGNS AND SYMPTOMS  Signs and symptoms depend on which disk is herniated.  For a herniated disk in the lower back, you may have sharp pain in:  One part of your leg, hip, or buttocks.  The back of your calf.  The top or sole of your foot (sciatica).   For a herniated disk in the neck, you may feel pain:  When you move your neck.  Near or over your shoulder blade.  That moves to your upper arm, forearm, or fingers.   You may also have muscle weakness. It may be hard to:  Lift your leg or arm.  Stand on your toes.  Squeeze tightly with one of your hands.  Other symptoms can include:  Numbness or tingling in the affected areas of your body.  Loss of bladder or bowel control. This is a rare but serious sign of a severe herniated disk in the lower back. DIAGNOSIS  Your health care provider will  do a physical exam. During this exam, you may have to move certain body parts or assume various positions. For example, your health care provider may do the straight-leg test. This is a good way to test for a herniated disk in your lower back. In this test, the health care provider lifts your leg while you lie on your back. This is to see if you feel pain down your leg. Your health care provider will also check for numbness or loss of feeling.  Your health care provider will also check your:  Reflexes.  Muscle strength.  Posture.  Other tests may be done to help in making a diagnosis. These may  include:  An X-ray of the spine to rule out other causes of back pain.   Other imaging studies, such as an MRI or CT scan. This is to check whether the herniated disk is pressing on your spinal canal.  Electromyography (EMG). This test checks the nerves that control muscles. It is sometimes used to identify the specific area of nerve involvement.  TREATMENT  In many cases, herniated disk symptoms go away over a period of days or weeks. You will most likely be free of symptoms in 3-4 months. Treatment may include the following:  The initial treatment for a herniated disk is ashort period of rest.  Bed rest is often limited to 1 or 2 days. Resting for too long delays recovery.  If you have a herniated disk in your lower back, you should avoid sitting as much as possible because sitting increases pressure on the disk.  Medicines. These may include:   Nonsteroidal anti-inflammatory drugs (NSAIDs).  Muscle relaxants for back spasms.  Narcotic pain medicine if your pain is very bad.   Steroid injections. You may need these along the involved nerve root to help control pain. The steroid is injected in the area of the herniated disk. It helps by reducing swelling around the disk.  Physical therapy. This may include exercises to strengthen the muscles that help support your spine.   You may need surgery if other treatments do not work.  HOME CARE INSTRUCTIONS Follow all your health care provider's instructions. These may include:  Take all medicines as directed by your health care provider.  Rest for 2 days and then start moving.  Do not sit or stand for long periods of time.  Maintain good posture when sitting and standing.  Avoid movements that cause pain, such as bending or lifting.  When you are able to start lifting things again:  [place] with your knees.  Keep your back straight.  Hold heavy objects close to your body.  If you are overweight, ask your health care  provider to help you start a weight-loss program.  When you are able to start exercising, ask your health care provider how much and what type of exercise is best for you.  Work with a physical therapist on stretching and strengthening exercises for your back.  Do not wear high-heeled shoes.  Do not sleep on your belly.  Do not smoke.  Keep all follow-up visits as directed by your health care provider. SEEK MEDICAL CARE IF:  You have back or neck pain that is not getting better after 4 weeks.  You have very bad pain in your back or neck.  You develop numbness, tingling, or weakness along with pain. SEEK IMMEDIATE MEDICAL CARE IF:   You have numbness, tingling, or weakness that makes you unable to use your arms or  legs.  You lose control of your bladder or bowels.  You have dizziness or fainting.  You have shortness of breath.  MAKE SURE YOU:   Understand these instructions.  Will watch your condition.  Will get help right away if you are not doing well or get worse. Document Released: 10/25/2000 Document Revised: 03/14/2014 Document Reviewed: 10/01/2013 [facility] Patient Information 2015 [place], [place]. This information is not intended to replace advice given to you by your health care provider. Make sure you discuss any questions you have with your health care provider.

## 2015-07-25 ENCOUNTER — Telehealth: Payer: Self-pay

## 2015-07-25 MED ORDER — PAROXETINE HCL 10 MG PO TABS: 5.0000 mg | ORAL_TABLET | Freq: Every day | ORAL | Status: DC

## 2015-07-25 NOTE — Telephone Encounter (Signed)
Pharmacy faxed over a note stating that [place]  Not covered by patient's insurance.  When Dr. Moshe Salisbury prescribed if on 06/01/15 he put note on the Rx that if the [place] 7.5 was not covered that they could use Paroxetine 10 mg and have patient take 1/2 tab daily. New Rx sent.

## 2015-07-28 ENCOUNTER — Telehealth: Payer: Self-pay | Admitting: Family

## 2015-07-28 DIAGNOSIS — M1990 Unspecified osteoarthritis, unspecified site: Secondary | ICD-10-CM

## 2015-07-28 DIAGNOSIS — J302 Other seasonal allergic rhinitis: Secondary | ICD-10-CM

## 2015-07-28 MED ORDER — MELOXICAM 7.5 MG PO TABS: 15.0000 mg | ORAL_TABLET | Freq: Every day | ORAL | Status: DC

## 2015-07-28 MED ORDER — MONTELUKAST SODIUM 10 MG PO TABS: 10.0000 mg | ORAL_TABLET | Freq: Every day | ORAL | Status: DC

## 2015-07-28 NOTE — Telephone Encounter (Signed)
Pt called request refill for meloxicam (MOBIC) 7.5 MG (pt sated that that's what on the bottle) and montelukast (SINGULAIR) 10 MG tablet to be send to [facility]. Pt also request medication for itching (lidocaine) to be send in also.

## 2015-07-28 NOTE — Telephone Encounter (Signed)
Medication refilled

## 2015-08-18 ENCOUNTER — Ambulatory Visit (INDEPENDENT_AMBULATORY_CARE_PROVIDER_SITE_OTHER): Payer: Medicare HMO | Admitting: Family Medicine

## 2015-08-18 VITALS — BP 124/78 | HR 121 | Temp 99.2°F | Resp 20 | Wt 133.8 lb

## 2015-08-18 DIAGNOSIS — M549 Dorsalgia, unspecified: Secondary | ICD-10-CM

## 2015-08-18 DIAGNOSIS — G8929 Other chronic pain: Secondary | ICD-10-CM | POA: Insufficient documentation

## 2015-08-18 MED ORDER — HYDROCODONE-ACETAMINOPHEN 5-325 MG PO TABS: 1.0000 | ORAL_TABLET | Freq: Three times a day (TID) | ORAL | Status: DC | PRN

## 2015-08-18 NOTE — Progress Notes (Signed)
Subjective:  Patient ID: France Ravens, female    DOB: 04-10-1946  Age: 69 y.o. MRN: [id number]  CC:  Back pain  HPI:  69 year old female with a PMH of chronic back pain presents to the clinic today with complaints of back pain.  Patient reports she's been experiencing worsening low back pain for the past few days. She states that this is secondary to the weather change. No relieving factors. She is has not taken her regular medications for this as she has just got them refilled (Mobic and Robaxin).  Worse with lying down and physical activity. She reports some associated radiation down the right leg. She also reports some body aches. No numbness/tingling.    Social Hx   Social History   Social History  . Marital Status: Divorced    Spouse Name: N/A  . Number of Children: 3  . Years of Education: 12   Occupational History  . Retired    Social History Main Topics  . Smoking status: Current Some Day Smoker -- 0.00 packs/day for 6 years    Types: Cigarettes  . Smokeless tobacco: Never Used  . Alcohol Use: 0.0 oz/week    0 Standard drinks or equivalent per week     Comment: socially  . Drug Use: No  . Sexual Activity: Yes   Other Topics Concern  . Not on file   Social History Narrative   Fun: play cards, play with her grandchildren, cooking   Denies religious beliefs effecting health care.    Review of Systems  Constitutional:       Subjective fever.  Musculoskeletal: Positive for back pain and arthralgias.    Objective:  BP 124/78 mmHg  Pulse 121  Temp(Src) 99.2 F (37.3 C)  Resp 20  Wt 133 lb 12.8 oz (60.691 kg)  SpO2 96%  BP/Weight 08/18/2015 07/24/2015 0/34/7425  Systolic BP 956 387 564  Diastolic BP 78 80 78  Wt. (Lbs) 133.8 144 145  BMI 22.27 23.96 24.13   Physical Exam  Constitutional: She is oriented to person, place, and time.  Elderly female; appears in pain.  Cardiovascular: Regular rhythm.  Tachycardia present.   Pulmonary/Chest: Effort normal and  breath sounds normal. No respiratory distress. She has no wheezes. She has no rales.  Musculoskeletal:  Lumbar spine - midline scar noted. Tender to palpation bilaterally of the upper lumbar spine. Decreased range of motion in all planes. Negative straight leg raise.  Neurological: She is alert and oriented to person, place, and time.  Psychiatric:  Flat affect.   Vitals reviewed.  Lab Results  Component Value Date   WBC 8.8 04/22/2015   HGB 10.4* 04/22/2015   HCT 31.5* 04/22/2015   PLT 223 04/22/2015   GLUCOSE 82 05/03/2015   CHOL * 12/04/2010    205        ATP III CLASSIFICATION:  <200     mg/dL   Desirable  200-239  mg/dL   Borderline High  >=240    mg/dL   High          TRIG 183* 12/04/2010   HDL 58 12/04/2010   LDLDIRECT 146.1 04/27/2007   LDLCALC * 12/04/2010    110        Total Cholesterol/HDL:CHD Risk Coronary Heart Disease Risk Table                     Men   Women  1/2 Average Risk   3.4  3.3  Average Risk       5.0   4.4  2 X Average Risk   9.6   7.1  3 X Average Risk  23.4   11.0        Use the calculated Patient Ratio above and the CHD Risk Table to determine the patient's CHD Risk.        ATP III CLASSIFICATION (LDL):  <100     mg/dL   Optimal  100-129  mg/dL   Near or Above                    Optimal  130-159  mg/dL   Borderline  160-189  mg/dL   High  >190     mg/dL   Very High   ALT 17 05/03/2015   AST 32 05/03/2015   NA 135 05/03/2015   K 3.9 05/03/2015   CL 100 05/03/2015   CREATININE 0.76 05/03/2015   BUN 9 05/03/2015   CO2 27 05/03/2015   TSH 0.57 05/03/2015   INR 1.17 10/04/2014   HGBA1C  12/03/2010    5.4 (NOTE)                                                                       According to the ADA Clinical Practice Recommendations for 2011, when HbA1c is used as a screening test:   >=6.5%   Diagnostic of Diabetes Mellitus           (if abnormal result  is confirmed)  5.7-6.4%   Increased risk of developing Diabetes Mellitus   References:Diagnosis and Classification of Diabetes Mellitus,Diabetes QTMA,2633,35(KTGYB 1):S62-S69 and Standards of Medical Care in         Diabetes - 2011,Diabetes Care,2011,34  (Suppl 1):S11-S61.    Assessment & Plan:   Problem List Items Addressed This Visit    Chronic back pain - Primary    Advise compliance with Mobic and Robaxin. Given acute flare, will treat with hydrocodone 5/325 Q8H PRN.I tried use tramadol but this was contraindicated in the setting of SSRI use. She was informed that this is not chronic as we do not manage chronic narcotics.      Relevant Medications   HYDROcodone-acetaminophen (NORCO/VICODIN) 5-325 MG tablet      Meds ordered this encounter  Medications  . HYDROcodone-acetaminophen (NORCO/VICODIN) 5-325 MG tablet    Sig: Take 1 tablet by mouth every 8 (eight) hours as needed for moderate pain.    Dispense:  20 tablet    Refill:  0    Follow-up: PRN  Thersa Salt, DO

## 2015-08-18 NOTE — Assessment & Plan Note (Signed)
Advise compliance with Mobic and Robaxin. Given acute flare, will treat with hydrocodone 5/325 Q8H PRN.I tried use tramadol but this was contraindicated in the setting of SSRI use. She was informed that this is not chronic as we do not manage chronic narcotics.

## 2015-08-18 NOTE — Patient Instructions (Signed)
It was nice to see you today.  Take the mobic and robaxin as directed.  Use the Hydrocodone as directed.  This is only short term. Our practice typically does not manage chronic narcotics.    Take care  Dr. Lacinda Axon

## 2015-08-22 ENCOUNTER — Other Ambulatory Visit: Payer: Self-pay | Admitting: Family

## 2015-08-22 DIAGNOSIS — L409 Psoriasis, unspecified: Secondary | ICD-10-CM

## 2015-08-22 NOTE — Telephone Encounter (Signed)
Receive call pt states she is needing refill on her muscle relaxant (Methocarbamol), and also her previous md rx some lidocaine lotion to help with the itching. Everytime she go outside & come back in her skin begins to itch. Currently taking the singulair but does not help with itching...Johny Chess

## 2015-08-23 MED ORDER — TRIAMCINOLONE ACETONIDE 0.1 % EX CREA: 1.0000 "application " | TOPICAL_CREAM | Freq: Two times a day (BID) | CUTANEOUS | Status: DC

## 2015-08-23 NOTE — Telephone Encounter (Signed)
Faxed zolpidem to [facility].../lmb

## 2015-08-23 NOTE — Addendum Note (Signed)
Addended by: Earnstine Regal on: 08/23/2015 10:06 AM   Modules accepted: Orders

## 2015-09-06 ENCOUNTER — Emergency Department (HOSPITAL_COMMUNITY)
Admission: EM | Admit: 2015-09-06 | Discharge: 2015-09-06 | Disposition: A | Discharge status: Home / Self Care | Payer: Medicare HMO | Attending: Emergency Medicine | Admitting: Emergency Medicine

## 2015-09-06 ENCOUNTER — Encounter (HOSPITAL_COMMUNITY): Payer: Self-pay

## 2015-09-06 DIAGNOSIS — E7800 Pure hypercholesterolemia, unspecified: Secondary | ICD-10-CM | POA: Diagnosis not present

## 2015-09-06 DIAGNOSIS — G4700 Insomnia, unspecified: Secondary | ICD-10-CM | POA: Insufficient documentation

## 2015-09-06 DIAGNOSIS — M545 Low back pain: Secondary | ICD-10-CM | POA: Diagnosis not present

## 2015-09-06 DIAGNOSIS — M158 Other polyosteoarthritis: Secondary | ICD-10-CM | POA: Diagnosis not present

## 2015-09-06 DIAGNOSIS — Z7951 Long term (current) use of inhaled steroids: Secondary | ICD-10-CM | POA: Insufficient documentation

## 2015-09-06 DIAGNOSIS — G8929 Other chronic pain: Secondary | ICD-10-CM | POA: Diagnosis not present

## 2015-09-06 DIAGNOSIS — Z720 Tobacco use: Secondary | ICD-10-CM | POA: Insufficient documentation

## 2015-09-06 DIAGNOSIS — Z85528 Personal history of other malignant neoplasm of kidney: Secondary | ICD-10-CM | POA: Diagnosis not present

## 2015-09-06 DIAGNOSIS — Z7952 Long term (current) use of systemic steroids: Secondary | ICD-10-CM | POA: Diagnosis not present

## 2015-09-06 DIAGNOSIS — Z8619 Personal history of other infectious and parasitic diseases: Secondary | ICD-10-CM | POA: Diagnosis not present

## 2015-09-06 DIAGNOSIS — Z791 Long term (current) use of non-steroidal anti-inflammatories (NSAID): Secondary | ICD-10-CM | POA: Diagnosis not present

## 2015-09-06 DIAGNOSIS — K219 Gastro-esophageal reflux disease without esophagitis: Secondary | ICD-10-CM | POA: Diagnosis not present

## 2015-09-06 DIAGNOSIS — Z79899 Other long term (current) drug therapy: Secondary | ICD-10-CM | POA: Insufficient documentation

## 2015-09-06 DIAGNOSIS — J45909 Unspecified asthma, uncomplicated: Secondary | ICD-10-CM | POA: Insufficient documentation

## 2015-09-06 MED ORDER — HYDROCODONE-ACETAMINOPHEN 5-325 MG PO TABS: ORAL_TABLET | ORAL | Status: DC

## 2015-09-06 MED ORDER — MORPHINE SULFATE (PF) 4 MG/ML IV SOLN
4.0000 mg | Freq: Once | INTRAVENOUS | Status: AC
  Administered 2015-09-06: 4 mg via INTRAMUSCULAR
  Filled 2015-09-06: qty 1

## 2015-09-06 NOTE — ED Provider Notes (Signed)
CSN: [id number]     Arrival date & time 09/06/15  1335 History  By signing my name below, I, Rayna Sexton, attest that this documentation has been prepared under the direction and in the presence of [employer], PA-C. Electronically Signed: Rayna Sexton, ED Scribe. 09/06/2015. 2:28 PM.   Chief Complaint  Patient presents with  . Back Pain   The history is provided by the patient. No language interpreter was used.    HPI Comments: [name] is a 69 y.o. female who presents to the Emergency Department complaining of chronic, constant, moderate, lower back pain with onset 10 months ago and worsening symptoms beginning 3 days ago. Pt notes having had an appointment with her spine specialist earlier today (Dr. Hal Neer) but was forced to reschedule to 10/30 and was told to come to the ED to receive something for short-term pain management. She notes associated, mild, subjective fever (99.2 F in triage), numbness, weakness and trouble ambulating. She denies any recent falls or trauma and notes worsening pain with any movement and relief when lying in a supine position. Pt denies having taken anything for pain management and further notes she has been taking prescription medication for pain management which she ran out of 2 weeks ago. Pt notes a hx of surgery to her lower back at [facility] in 10/2014. Pt notes a hx of kidney CA.   Past Medical History  Diagnosis Date  . Seasonal allergies   . Hypercholesteremia     under control  . GERD (gastroesophageal reflux disease)     occasional  . Arthritis     back-severe, hips, right knee  . H/O mumps   . H/O measles   . Insomnia   . Asthma   . Allergy   . Renal cell carcinoma 2012    left   Past Surgical History  Procedure Laterality Date  . Kidney surgery  12/2010    [facility]; partial nephrectomy  . Hernia repair  12/2977    supraumbilical repair  . Bunionectomy  04/2011  . Menisectomy  2010    left knee  . Cholecystectomy   11/13/2011    Procedure: LAPAROSCOPIC CHOLECYSTECTOMY WITH INTRAOPERATIVE CHOLANGIOGRAM;  Surgeon: Judieth Keens, DO;  Location: [facility];  Service: General;  Laterality: N/A;  . Esophagogastroduodenoscopy N/A 09/25/2013    Procedure: ESOPHAGOGASTRODUODENOSCOPY (EGD);  Surgeon: Lear Ng, MD;  Location: [facility];  Service: Endoscopy;  Laterality: N/A;  . Colonoscopy N/A 09/25/2013    Procedure: COLONOSCOPY;  Surgeon: Lear Ng, MD;  Location: [facility];  Service: Endoscopy;  Laterality: N/A;  . Abdominal hysterectomy  40years ago  . Tubal ligation  44 years ago  . Lumbar laminectomy/decompression microdiscectomy Right 10/12/2014    Procedure: HEMI LAMINECTOMY MICRODISCECTOMY L5-S1 RIGHT (1 LEVEL);  Surgeon: Tobi Bastos, MD;  Location: [facility];  Service: Orthopedics;  Laterality: Right;   Family History  Problem Relation Age of Onset  . Hyperlipidemia Mother   . Hypertension Mother    Social History  Substance Use Topics  . Smoking status: Current Some Day Smoker -- 0.00 packs/day for 6 years    Types: Cigarettes  . Smokeless tobacco: Never Used  . Alcohol Use: 0.0 oz/week    0 Standard drinks or equivalent per week     Comment: socially   OB History    Gravida Para Term Preterm AB TAB SAB Ectopic Multiple Living   3 3        3  Review of Systems A complete 10 system review of systems was obtained and all systems are negative except as noted in the HPI and PMH.   Allergies  Azithromycin  Home Medications   Prior to Admission medications   Medication Sig Start Date End Date Taking? Authorizing Provider  albuterol (PROVENTIL HFA;VENTOLIN HFA) 108 (90 BASE) MCG/ACT inhaler Inhale 2 puffs into the lungs every 6 (six) hours as needed for wheezing or shortness of breath.     Historical Provider, MD  DULoxetine (CYMBALTA) 60 MG capsule Take 60 mg by mouth daily. 12/27/14   Historical Provider, MD  famotidine (PEPCID) 20 MG tablet Take 1 tablet (20  mg total) by mouth 2 (two) times daily. 01/27/15   Hanna Patel-Mills, PA-C  Fluticasone-Salmeterol (ADVAIR) 500-50 MCG/DOSE AEPB Inhale 1 puff into the lungs 2 (two) times daily.    Historical Provider, MD  HYDROcodone-acetaminophen (NORCO/VICODIN) 5-325 MG tablet Take 1 tablet by mouth every 8 (eight) hours as needed for moderate pain. 08/18/15   Jayce G Cook, DO  KLOR-CON M20 20 MEQ tablet Take 20 mEq by mouth 2 (two) times daily with a meal. 10/21/14   Historical Provider, MD  Magnesium Oxide 400 (240 MG) MG TABS Take 1 tablet by mouth daily. 04/02/15   Historical Provider, MD  meloxicam (MOBIC) 7.5 MG tablet Take 2 tablets (15 mg total) by mouth daily. 07/28/15   Golden Circle, FNP  methocarbamol (ROBAXIN) 500 MG tablet Take 1 tablet (500 mg total) by mouth every 6 (six) hours as needed for muscle spasms. 07/05/15   Golden Circle, FNP  montelukast (SINGULAIR) 10 MG tablet Take 1 tablet (10 mg total) by mouth at bedtime. 07/28/15   Golden Circle, FNP  PARoxetine (PAXIL) 10 MG tablet Take 0.5 tablets (5 mg total) by mouth daily. 07/25/15   Terrance Mass, MD  predniSONE (DELTASONE) 10 MG tablet Take 6 tablets x 1 day, 5 tablets x 1 day, 4 tablets x 1 day, 3 tablets x 1 day, 2 tablets x 1 day, 1 tablet x 1 day 07/24/15   Golden Circle, FNP  simvastatin (ZOCOR) 40 MG tablet Take 40 mg by mouth every morning.    Historical Provider, MD  triamcinolone cream (KENALOG) 0.1 % Apply 1 application topically 2 (two) times daily. 08/23/15   Golden Circle, FNP  zolpidem (AMBIEN) 5 MG tablet TAKE 1 TABLET BY MOUTH EVERY DAY AT BEDTIME FOR SLEEP 08/23/15   Golden Circle, FNP   There were no vitals taken for this visit. Physical Exam  Constitutional: She is oriented to person, place, and time. She appears well-developed and well-nourished.  HENT:  Head: Normocephalic and atraumatic.  Mouth/Throat: No oropharyngeal exudate.  Eyes: Conjunctivae are normal.  Neck: Normal range of motion. No tracheal  deviation present.  Cardiovascular: Normal rate, regular rhythm and intact distal pulses.   Pulmonary/Chest: Effort normal. No respiratory distress.  Abdominal: Soft. There is no tenderness.  Musculoskeletal: Normal range of motion.  Neurological: She is alert and oriented to person, place, and time.  No point tenderness to percussion of lumbar spinal processes.  No TTP or paraspinal muscular spasm. Strength is 5 out of 5 to bilateral lower extremities at hip and knee; extensor hallucis longus 5 out of 5. Ankle strength 5 out of 5, no clonus, neurovascularly intact. No saddle anaesthesia. Patellar reflexes are 2+ bilaterally.      Skin: Skin is warm and dry. She is not diaphoretic.  Psychiatric: She has a  normal mood and affect. Her behavior is normal.  Nursing note and vitals reviewed.  ED Course  Procedures  DIAGNOSTIC STUDIES: Oxygen Saturation is 96% on RA, normal by my interpretation.    COORDINATION OF CARE: 2:24 PM Pt presents today due to worsening lower back pain. Discussed treatment plan with pt at bedside including an rx for pain management. Return precautions noted. Pt agreed to plan.  Labs Review Labs Reviewed - No data to display  Imaging Review No results found. I have personally reviewed and evaluated these images as part of my medical decision-making.   EKG Interpretation None      MDM   Final diagnoses:  Acute exacerbation of chronic low back pain   Filed Vitals:   09/06/15 1500 09/06/15 1520 09/06/15 1532 09/06/15 1533  BP:      Pulse:      Temp: 98.7 F (37.1 C) 98.7 F (37.1 C) 98.8 F (37.1 C) 98.7 F (37.1 C)  TempSrc: Oral Oral Oral Oral  Resp:      Height:      Weight:      SpO2:        Medications  morphine 4 MG/ML injection 4 mg (4 mg Intramuscular Given 09/06/15 1434)    [name] is 69 y.o. female presenting with exacerbation of chronic low back pain. Neuro exam nonfocal. Patient is without pain medication at home. She had an  appointment at Dr. Sande Rives office this morning but they had to reschedule because she didn't have her MRI with her. Patient was encouraged to come to the ED by her primary care physician for pain medication. Patient reports tactile fever at home however she is afebrile in the ED.  This is a shared visit with the attending physician who personally evaluated the patient and agrees with the care plan.   Evaluation does not show pathology that would require ongoing emergent intervention or inpatient treatment. Pt is hemodynamically stable and mentating appropriately. Discussed findings and plan with patient/guardian, who agrees with care plan. All questions answered. Return precautions discussed and outpatient follow up given.   I personally performed the services described in this documentation, which was scribed in my presence. The recorded information has been reviewed and is accurate.    Monico Blitz, PA-C 09/06/15 [place], MD 09/07/15 [phone]

## 2015-09-06 NOTE — Discharge Instructions (Signed)
Take vicodin for breakthrough pain, do not drink alcohol, drive, care for children or do other critical tasks while taking vicodin.  Please be very careful not to fall! The pain medication and puts you at risk for falls. Please rest as much as possible and try to not stay alone.    Back Pain, Adult Back pain is very common in adults.The cause of back pain is rarely dangerous and the pain often gets better over time.The cause of your back pain may not be known. Some common causes of back pain include:  Strain of the muscles or ligaments supporting the spine.  Wear and tear (degeneration) of the spinal disks.  Arthritis.  Direct injury to the back. For many people, back pain may return. Since back pain is rarely dangerous, most people can learn to manage this condition on their own. HOME CARE INSTRUCTIONS Watch your back pain for any changes. The following actions may help to lessen any discomfort you are feeling:  Remain active. It is stressful on your back to sit or stand in one place for long periods of time. Do not sit, drive, or stand in one place for more than 30 minutes at a time. Take short walks on even surfaces as soon as you are able.Try to increase the length of time you walk each day.  Exercise regularly as directed by your health care provider. Exercise helps your back heal faster. It also helps avoid future injury by keeping your muscles strong and flexible.  Do not stay in bed.Resting more than 1-2 days can delay your recovery.  Pay attention to your body when you bend and lift. The most comfortable positions are those that put less stress on your recovering back. Always use proper lifting techniques, including:  Bending your knees.  Keeping the load close to your body.  Avoiding twisting.  Find a comfortable position to sleep. Use a firm mattress and lie on your side with your knees slightly bent. If you lie on your back, put a pillow under your knees.  Avoid  feeling anxious or stressed.Stress increases muscle tension and can worsen back pain.It is important to recognize when you are anxious or stressed and learn ways to manage it, such as with exercise.  Take medicines only as directed by your health care provider. Over-the-counter medicines to reduce pain and inflammation are often the most helpful.Your health care provider may prescribe muscle relaxant drugs.These medicines help dull your pain so you can more quickly return to your normal activities and healthy exercise.  Apply ice to the injured area:  Put ice in a plastic bag.  Place a towel between your skin and the bag.  Leave the ice on for 20 minutes, 2-3 times a day for the first 2-3 days. After that, ice and heat may be alternated to reduce pain and spasms.  Maintain a healthy weight. Excess weight puts extra stress on your back and makes it difficult to maintain good posture. SEEK MEDICAL CARE IF:  You have pain that is not relieved with rest or medicine.  You have increasing pain going down into the legs or buttocks.  You have pain that does not improve in one week.  You have night pain.  You lose weight.  You have a fever or chills. SEEK IMMEDIATE MEDICAL CARE IF:   You develop new bowel or bladder control problems.  You have unusual weakness or numbness in your arms or legs.  You develop nausea or vomiting.  You develop  abdominal pain.  You feel faint.   This information is not intended to replace advice given to you by your health care provider. Make sure you discuss any questions you have with your health care provider.   Document Released: 10/28/2005 Document Revised: 11/18/2014 Document Reviewed: 03/01/2014 Elsevier Interactive Patient Education [employer].

## 2015-09-06 NOTE — ED Notes (Signed)
Patient c/o low back pain x 4 months and progressively getting worse. Patient denies any numbness or tingling of extremities or dysuria.

## 2015-09-08 ENCOUNTER — Ambulatory Visit (INDEPENDENT_AMBULATORY_CARE_PROVIDER_SITE_OTHER): Payer: Medicare HMO | Admitting: Family

## 2015-09-08 ENCOUNTER — Encounter: Payer: Self-pay | Admitting: Family

## 2015-09-08 VITALS — BP 138/82 | HR 111 | Temp 98.8°F | Resp 20 | Ht 65.0 in | Wt 154.0 lb

## 2015-09-08 DIAGNOSIS — M5126 Other intervertebral disc displacement, lumbar region: Secondary | ICD-10-CM

## 2015-09-08 MED ORDER — HYDROCODONE-ACETAMINOPHEN 5-325 MG PO TABS: 1.0000 | ORAL_TABLET | Freq: Four times a day (QID) | ORAL | Status: DC | PRN

## 2015-09-08 MED ORDER — METHOCARBAMOL 750 MG PO TABS: 750.0000 mg | ORAL_TABLET | Freq: Three times a day (TID) | ORAL | Status: DC | PRN

## 2015-09-08 MED ORDER — DOCUSATE SODIUM 100 MG PO CAPS: 100.0000 mg | ORAL_CAPSULE | Freq: Every day | ORAL | Status: DC | PRN

## 2015-09-08 MED ORDER — KETOROLAC TROMETHAMINE 60 MG/2ML IM SOLN
60.0000 mg | Freq: Once | INTRAMUSCULAR | Status: AC
  Administered 2015-09-08: 60 mg via INTRAMUSCULAR

## 2015-09-08 NOTE — Assessment & Plan Note (Addendum)
Continues to experience uncontrolled low back pain that was modified with previously prescribed narcotics. In office injection of ketorelac given. Refill hydrocodone-acetaminophen and increase methocarbamol. Start colace. Continue home exercise therapy. Follow up with neurosurgery as scheduled.

## 2015-09-08 NOTE — Progress Notes (Signed)
Pre visit review using our clinic review tool, if applicable. No additional management support is needed unless otherwise documented below in the visit note. 

## 2015-09-08 NOTE — Patient Instructions (Signed)
Thank you for choosing [facility].  Summary/Instructions:  Your prescription(s) have been submitted to your pharmacy or been printed and provided for you. Please take as directed and contact our office if you believe you are having problem(s) with the medication(s) or have any questions.  If your symptoms worsen or fail to improve, please contact our office for further instruction, or in case of emergency go directly to the emergency room at the closest medical facility.   Follow up with neurosurgery.

## 2015-09-08 NOTE — Progress Notes (Signed)
Subjective:    Patient ID: [name], female    DOB: 1946-04-19, 69 y.o.   MRN: [id number]  Chief Complaint  Patient presents with  . Medication Refill    would like refill of pain meds until she goes to the neurosurgeon on monday, muscle relaxer, pain pill, anti itch medicine and stool softner    HPI:  [name] is a 69 y.o. female who  has a past medical history of Seasonal allergies; Hypercholesteremia; GERD (gastroesophageal reflux disease); Arthritis; H/O mumps; H/O measles; Insomnia; Asthma; Allergy; and Renal cell carcinoma (2012). and presents today for an office follow up.  1.) Back pain - Continues to experience the associated symptom of pain located in her lower back which was determined to be be a herniated lumbar disc and is being followed by neurosurgery. Back pain has been refractory to physical therapy and prednisone treatments. Pain is located in the lumbar spine midline and goes to both the right and the left going to the left more. Severity of the pain is a 10/10. Able to complete her activities of daily living are able to be completed, however impaired. Notes that she does experience some relief with the previously prescribed narcotics which lowered the pain a couple of levels. She also experienced constipation with the medication.    Allergies  Allergen Reactions  . Azithromycin Itching and Swelling     Current Outpatient Prescriptions on File Prior to Visit  Medication Sig Dispense Refill  . albuterol (PROVENTIL HFA;VENTOLIN HFA) 108 (90 BASE) MCG/ACT inhaler Inhale 2 puffs into the lungs every 6 (six) hours as needed for wheezing or shortness of breath.     . DULoxetine (CYMBALTA) 60 MG capsule Take 60 mg by mouth daily.  2  . famotidine (PEPCID) 20 MG tablet Take 1 tablet (20 mg total) by mouth 2 (two) times daily. 20 tablet 0  . Fluticasone-Salmeterol (ADVAIR) 500-50 MCG/DOSE AEPB Inhale 1 puff into the lungs 2 (two) times daily.    Marland Kitchen KLOR-CON M20 20 MEQ  tablet Take 20 mEq by mouth 2 (two) times daily with a meal.  2  . Magnesium Oxide 400 (240 MG) MG TABS Take 1 tablet by mouth daily.  3  . meloxicam (MOBIC) 7.5 MG tablet Take 2 tablets (15 mg total) by mouth daily. 30 tablet 1  . montelukast (SINGULAIR) 10 MG tablet Take 1 tablet (10 mg total) by mouth at bedtime. 30 tablet 3  . PARoxetine (PAXIL) 10 MG tablet Take 0.5 tablets (5 mg total) by mouth daily. 30 tablet 3  . predniSONE (DELTASONE) 10 MG tablet Take 6 tablets x 1 day, 5 tablets x 1 day, 4 tablets x 1 day, 3 tablets x 1 day, 2 tablets x 1 day, 1 tablet x 1 day 21 tablet 0  . simvastatin (ZOCOR) 40 MG tablet Take 40 mg by mouth every morning.    . triamcinolone cream (KENALOG) 0.1 % Apply 1 application topically 2 (two) times daily. 30 g 0  . zolpidem (AMBIEN) 5 MG tablet TAKE 1 TABLET BY MOUTH EVERY DAY AT BEDTIME FOR SLEEP 30 tablet 0   No current facility-administered medications on file prior to visit.     Past Surgical History  Procedure Laterality Date  . Kidney surgery  12/2010    [facility]; partial nephrectomy  . Hernia repair  05/487    supraumbilical repair  . Bunionectomy  04/2011  . Menisectomy  2010    left knee  . Cholecystectomy  11/13/2011    Procedure: LAPAROSCOPIC CHOLECYSTECTOMY WITH INTRAOPERATIVE CHOLANGIOGRAM;  Surgeon: Judieth Keens, DO;  Location: [facility];  Service: General;  Laterality: N/A;  . Esophagogastroduodenoscopy N/A 09/25/2013    Procedure: ESOPHAGOGASTRODUODENOSCOPY (EGD);  Surgeon: Lear Ng, MD;  Location: [facility];  Service: Endoscopy;  Laterality: N/A;  . Colonoscopy N/A 09/25/2013    Procedure: COLONOSCOPY;  Surgeon: Lear Ng, MD;  Location: [facility];  Service: Endoscopy;  Laterality: N/A;  . Abdominal hysterectomy  40years ago  . Tubal ligation  44 years ago  . Lumbar laminectomy/decompression microdiscectomy Right 10/12/2014    Procedure: HEMI LAMINECTOMY MICRODISCECTOMY L5-S1 RIGHT (1 LEVEL);  Surgeon:  Tobi Bastos, MD;  Location: [facility];  Service: Orthopedics;  Laterality: Right;     Review of Systems  Musculoskeletal: Positive for back pain.  Neurological: Negative for weakness and numbness.      Objective:    BP 138/82 mmHg  Pulse 111  Temp(Src) 98.8 F (37.1 C) (Oral)  Resp 20  Ht 5\' 5"  (1.651 m)  Wt 154 lb (69.854 kg)  BMI 25.63 kg/m2  SpO2 98% Nursing note and vital signs reviewed.  Physical Exam  Constitutional: She is oriented to person, place, and time. She appears well-developed and well-nourished. No distress.  Cardiovascular: Normal rate, regular rhythm, normal heart sounds and intact distal pulses.   Pulmonary/Chest: Effort normal and breath sounds normal.  Musculoskeletal:  Lumbar spine - excessive lordotic curve noted. No other deformities, discoloration, or edema. Palpable tenderness in lower lumbar region along spinous processes and left paraspinal musculature.  Range of motion is full with discomfort in all directions. Straight leg raise is negative. Distal pulses and sensation are intact and appropriate.   Neurological: She is alert and oriented to person, place, and time.  Skin: Skin is warm and dry.  Psychiatric: She has a normal mood and affect. Her behavior is normal. Judgment and thought content normal.       Assessment & Plan:   Problem List Items Addressed This Visit      Musculoskeletal and Integument   Herniated lumbar intervertebral disc - Primary    Continues to experience uncontrolled low back pain that was modified with previously prescribed narcotics. In office injection of ketorelac given. Refill hydrocodone-acetaminophen and increase methocarbamol. Start colace. Continue home exercise therapy. Follow up with neurosurgery as scheduled.       Relevant Medications   methocarbamol (ROBAXIN) 750 MG tablet   HYDROcodone-acetaminophen (NORCO/VICODIN) 5-325 MG tablet   docusate sodium (COLACE) 100 MG capsule

## 2015-09-14 ENCOUNTER — Telehealth: Payer: Self-pay | Admitting: *Deleted

## 2015-09-14 NOTE — Telephone Encounter (Signed)
Left msg on triage stating saw Marya Amsler last week meant to ask if he would rx Lunesta for her to help with her sleep...Johny Chess

## 2015-09-18 NOTE — Telephone Encounter (Signed)
Is she continuing to take the Ambien? Has she tried over the counter medications? The biggest concern with her being on the narcotics for pain would be the increased risk for falls with the addition of another sedating medication.

## 2015-09-18 NOTE — Telephone Encounter (Signed)
Msg was closed pl advise...[name]

## 2015-09-19 ENCOUNTER — Telehealth: Payer: Self-pay | Admitting: Family

## 2015-09-19 ENCOUNTER — Other Ambulatory Visit: Payer: Self-pay | Admitting: Family
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
## 2015-09-19 NOTE — Telephone Encounter (Signed)
Called pt no answer LMOM RTC.../lmb 

## 2015-09-20 ENCOUNTER — Other Ambulatory Visit: Payer: Self-pay | Admitting: Family

## 2015-09-20 NOTE — Telephone Encounter (Signed)
Tried calling pt again still no answer LMOM with [name] statement below...[name]

## 2015-09-20 NOTE — Telephone Encounter (Signed)
Rx has been faxed.

## 2015-09-27 IMAGING — DX DG SPINE 1V PORT
1 series · 1 of 1 positions shown · non-contrast
Comparison: Study obtained earlier in the day

CLINICAL DATA: L5-S1 microdiskectomy

EXAM:
PORTABLE SPINE - 1 VIEW

[l-spine x-table]
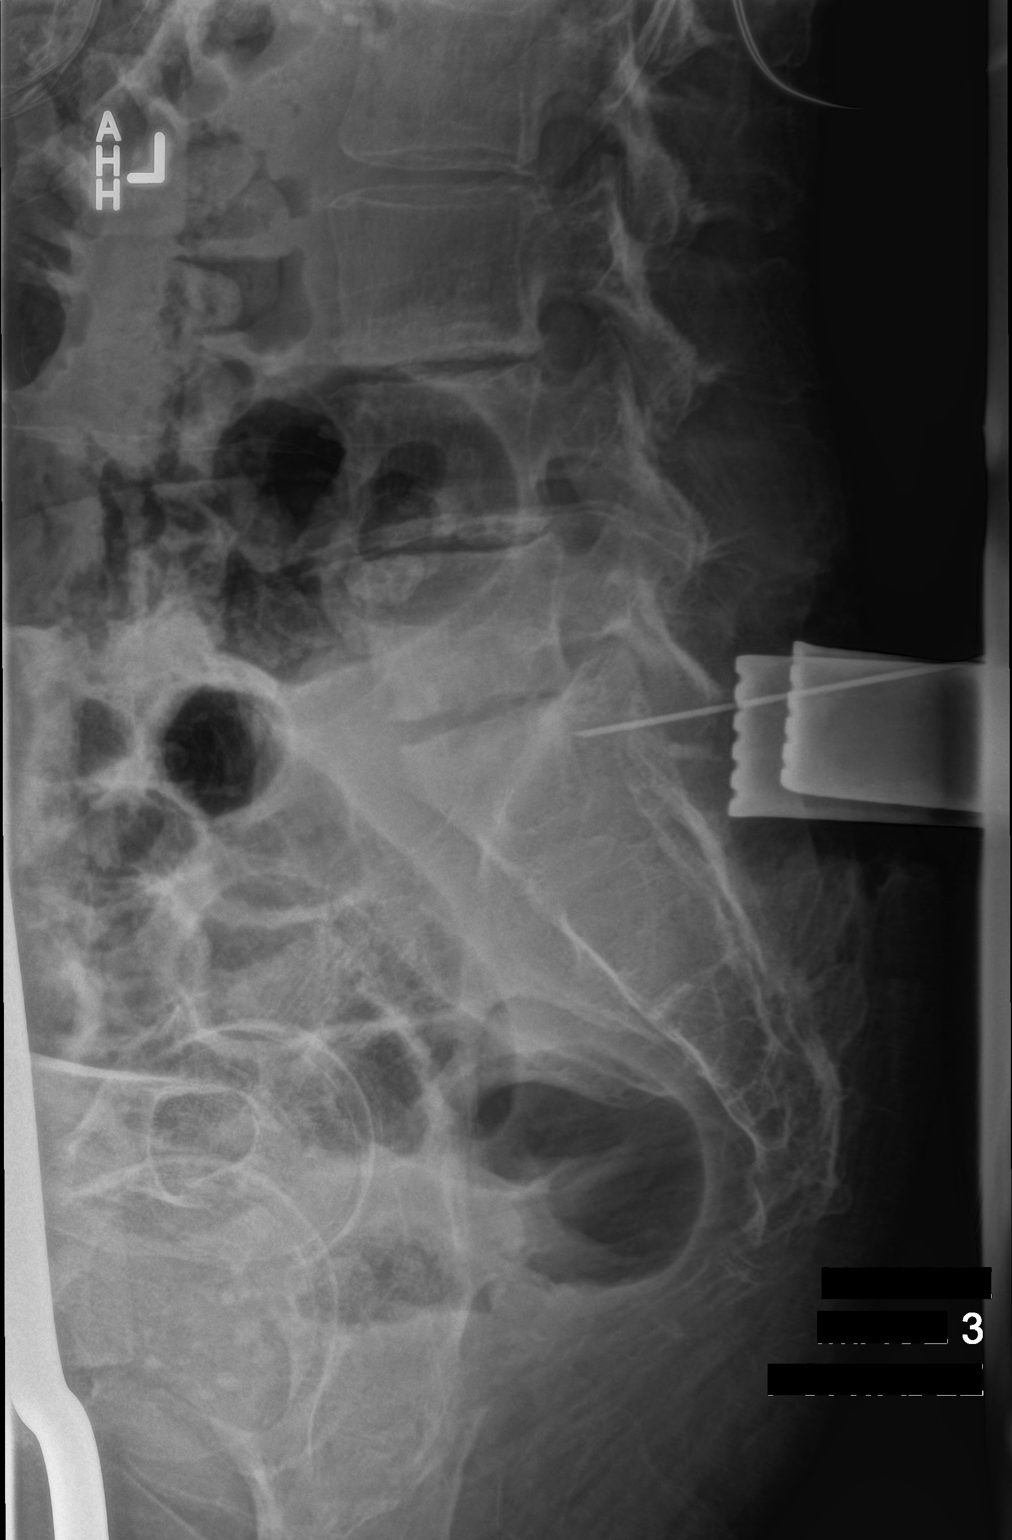

[1 of 1 positions shown; findings below may reference images not displayed]

FINDINGS: Cross-table lateral lumbar spine image obtained. Metallic probe tip
is posterior to the posterior aspect of the S1 vertebral body. No
fracture or spondylolisthesis. There is moderate generalized
osteoarthritic change.
IMPRESSION: Metallic probe tip posterior to the posterior aspect of the L1
vertebral body, slightly inferior to the L5-S1 interspace level.
Generalized osteoarthritic change.

## 2015-09-27 IMAGING — DX DG SPINE 1V PORT
1 series · 1 of 1 positions shown · non-contrast
Comparison: October 04, 2014

CLINICAL DATA: L5-S1 microdiskectomy

EXAM:
PORTABLE SPINE - 1 VIEW

[l-spine x-table]
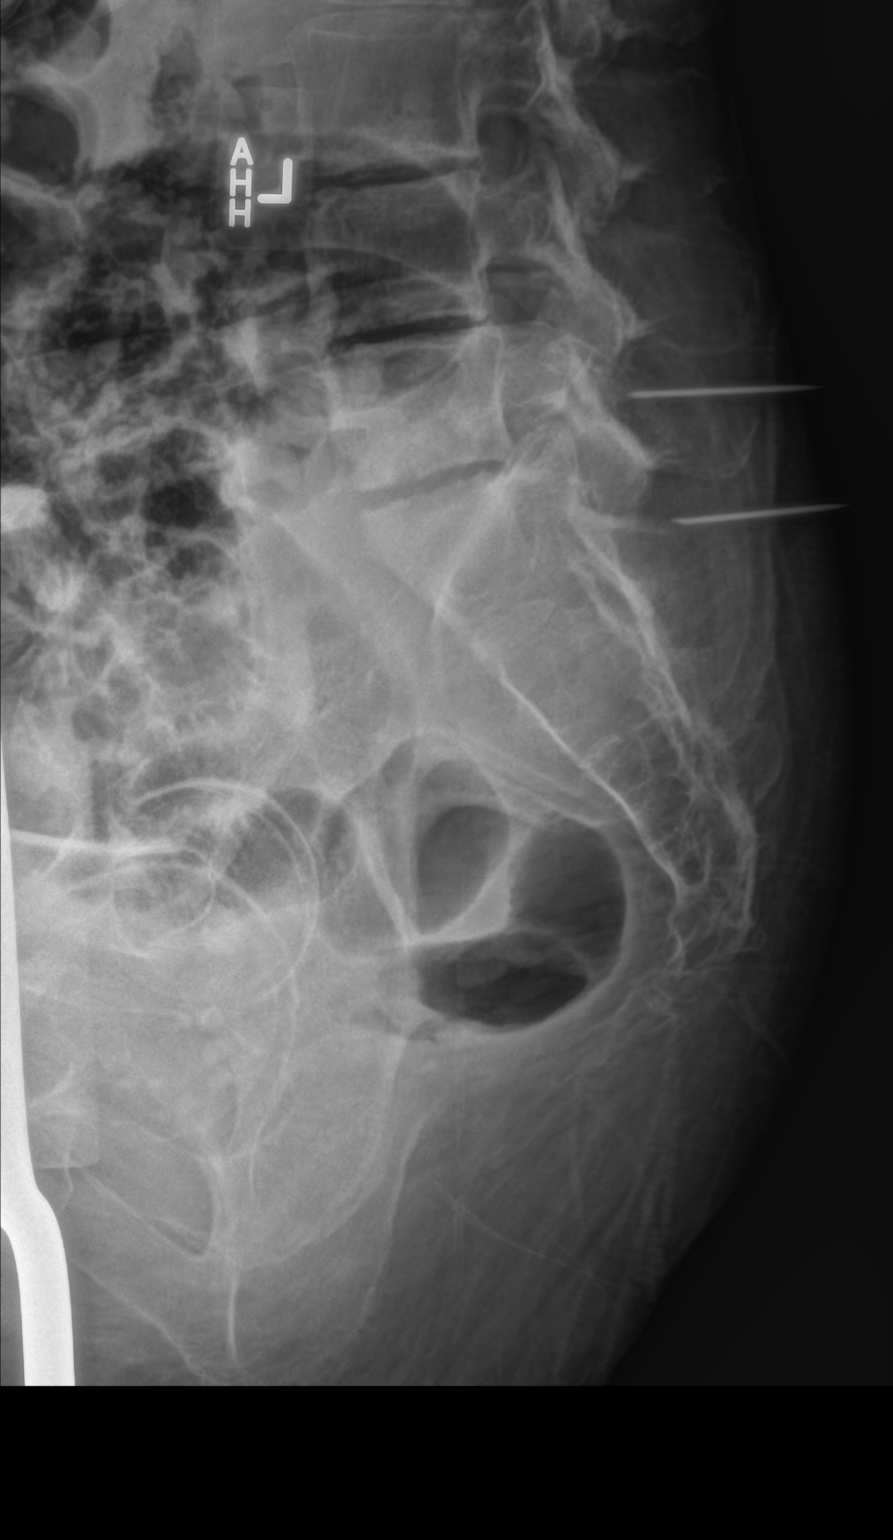

[1 of 1 positions shown; findings below may reference images not displayed]

FINDINGS: Cross-table lateral image was obtained. There are metallic probe
tips posterior to the inferior aspect of S1 and the mid to inferior
aspect of L5. There is moderate disc space narrowing at L3-4, L4-5,
and L5-S1. No fracture or spondylolisthesis.
IMPRESSION: Probes tips are posterior to the L5 and S1 vertebral body levels.
Multilevel osteoarthritic change.

## 2015-10-09 ENCOUNTER — Other Ambulatory Visit: Payer: Self-pay | Admitting: Family

## 2015-10-11 ENCOUNTER — Ambulatory Visit: Payer: Medicare HMO | Admitting: Family

## 2015-10-11 DIAGNOSIS — Z0289 Encounter for other administrative examinations: Secondary | ICD-10-CM

## 2015-10-25 ENCOUNTER — Ambulatory Visit (INDEPENDENT_AMBULATORY_CARE_PROVIDER_SITE_OTHER): Payer: Medicare HMO | Admitting: Family

## 2015-10-25 ENCOUNTER — Encounter: Payer: Self-pay | Admitting: Family

## 2015-10-25 VITALS — BP 132/82 | HR 78 | Temp 98.4°F | Resp 18 | Ht 65.0 in | Wt 147.8 lb

## 2015-10-25 DIAGNOSIS — M1990 Unspecified osteoarthritis, unspecified site: Secondary | ICD-10-CM

## 2015-10-25 DIAGNOSIS — G4700 Insomnia, unspecified: Secondary | ICD-10-CM

## 2015-10-25 DIAGNOSIS — E785 Hyperlipidemia, unspecified: Secondary | ICD-10-CM | POA: Diagnosis not present

## 2015-10-25 DIAGNOSIS — M5126 Other intervertebral disc displacement, lumbar region: Secondary | ICD-10-CM

## 2015-10-25 DIAGNOSIS — J4530 Mild persistent asthma, uncomplicated: Secondary | ICD-10-CM

## 2015-10-25 DIAGNOSIS — J302 Other seasonal allergic rhinitis: Secondary | ICD-10-CM

## 2015-10-25 MED ORDER — PAROXETINE HCL 10 MG PO TABS: 5.0000 mg | ORAL_TABLET | Freq: Every day | ORAL | Status: DC

## 2015-10-25 MED ORDER — MONTELUKAST SODIUM 10 MG PO TABS: 10.0000 mg | ORAL_TABLET | Freq: Every day | ORAL | Status: DC

## 2015-10-25 MED ORDER — DULOXETINE HCL 60 MG PO CPEP: 60.0000 mg | ORAL_CAPSULE | Freq: Every day | ORAL | Status: DC

## 2015-10-25 MED ORDER — METHOCARBAMOL 750 MG PO TABS: ORAL_TABLET | ORAL | Status: DC

## 2015-10-25 MED ORDER — SIMVASTATIN 40 MG PO TABS: 40.0000 mg | ORAL_TABLET | Freq: Every morning | ORAL | Status: DC

## 2015-10-25 MED ORDER — HYDROCODONE-ACETAMINOPHEN 5-325 MG PO TABS: 1.0000 | ORAL_TABLET | Freq: Four times a day (QID) | ORAL | Status: DC | PRN

## 2015-10-25 MED ORDER — FAMOTIDINE 20 MG PO TABS: 20.0000 mg | ORAL_TABLET | Freq: Two times a day (BID) | ORAL | Status: DC

## 2015-10-25 MED ORDER — ZOLPIDEM TARTRATE 5 MG PO TABS: ORAL_TABLET | ORAL | Status: DC

## 2015-10-25 MED ORDER — MELOXICAM 15 MG PO TABS: 15.0000 mg | ORAL_TABLET | Freq: Every day | ORAL | Status: DC

## 2015-10-25 MED ORDER — POTASSIUM CHLORIDE CRYS ER 20 MEQ PO TBCR: 20.0000 meq | EXTENDED_RELEASE_TABLET | Freq: Two times a day (BID) | ORAL | Status: DC

## 2015-10-25 NOTE — Assessment & Plan Note (Signed)
Stable with zolpidem and reports adequate sleep with no adverse events noted. Continue current dosage of zolpidem. Follow up if insomnia worsens.

## 2015-10-25 NOTE — Progress Notes (Signed)
Subjective:    Patient ID: [name], female    DOB: 12/06/1945, 69 y.o.   MRN: [id number]  Chief Complaint  Patient presents with  . Medication Refill    refill on all medications    HPI:  [name] is a 69 y.o. female who  has a past medical history of Seasonal allergies; Hypercholesteremia; GERD (gastroesophageal reflux disease); Arthritis; H/O mumps; H/O measles; Insomnia; Asthma; Allergy; and Renal cell carcinoma (2012). and presents today for a follow up office visit.   1.) Asthma - Currently maintained on albuterol, Advair, and montelukast. Takes the medication as prescribed and notes that her symptoms appear stable with the current regimen. Denies adverse side effects of medication.  2.) Herniated lumbar disc - Pain is currently managed with meloxicam , robaxin and Norco. She has been seen by Neurosurgery who advised her of potential infection in her back from previous surgery and encouraged her to follow up with her original surgeon. Takes the medication as prescribed and denies adverse side effects. She is also taking a stool softener to prevent OIC.   3.) Insomnia - Currently maintained on zolpidem. Takes the medication as prescribed and denies adverse side effects. Reports she is getting adequate sleep with the medication.  Allergies  Allergen Reactions  . Azithromycin Itching and Swelling     Current Outpatient Prescriptions on File Prior to Visit  Medication Sig Dispense Refill  . albuterol (PROVENTIL HFA;VENTOLIN HFA) 108 (90 BASE) MCG/ACT inhaler Inhale 2 puffs into the lungs every 6 (six) hours as needed for wheezing or shortness of breath.     . [employer] STOOL SOFTENER 100 MG capsule TAKE 1-2 CAPSULES BY MOUTH DAILY AS NEEDED FOR MILD CONSTIPATION. 60 capsule 0  . Fluticasone-Salmeterol (ADVAIR) 500-50 MCG/DOSE AEPB Inhale 1 puff into the lungs 2 (two) times daily.    . Magnesium Oxide 400 (240 MG) MG TABS Take 1 tablet by mouth daily.  3  . predniSONE (DELTASONE) 10  MG tablet Take 6 tablets x 1 day, 5 tablets x 1 day, 4 tablets x 1 day, 3 tablets x 1 day, 2 tablets x 1 day, 1 tablet x 1 day 21 tablet 0  . triamcinolone cream (KENALOG) 0.1 % APPLY TO AFFECTED AREA TWICE A DAY 30 g 0   No current facility-administered medications on file prior to visit.     Past Surgical History  Procedure Laterality Date  . Kidney surgery  12/2010    [facility]; partial nephrectomy  . Hernia repair  [id number]    supraumbilical repair  . Bunionectomy  04/2011  . Menisectomy  2010    left knee  . Cholecystectomy  11/13/2011    Procedure: LAPAROSCOPIC CHOLECYSTECTOMY WITH INTRAOPERATIVE CHOLANGIOGRAM;  Surgeon: Judieth Keens, DO;  Location: [facility];  Service: General;  Laterality: N/A;  . Esophagogastroduodenoscopy N/A 09/25/2013    Procedure: ESOPHAGOGASTRODUODENOSCOPY (EGD);  Surgeon: Lear Ng, MD;  Location: [facility];  Service: Endoscopy;  Laterality: N/A;  . Colonoscopy N/A 09/25/2013    Procedure: COLONOSCOPY;  Surgeon: Lear Ng, MD;  Location: [facility];  Service: Endoscopy;  Laterality: N/A;  . Abdominal hysterectomy  40years ago  . Tubal ligation  44 years ago  . Lumbar laminectomy/decompression microdiscectomy Right 10/12/2014    Procedure: HEMI LAMINECTOMY MICRODISCECTOMY L5-S1 RIGHT (1 LEVEL);  Surgeon: Tobi Bastos, MD;  Location: [facility];  Service: Orthopedics;  Laterality: Right;    Past Medical History  Diagnosis Date  . Seasonal allergies   .  Hypercholesteremia     under control  . GERD (gastroesophageal reflux disease)     occasional  . Arthritis     back-severe, hips, right knee  . H/O mumps   . H/O measles   . Insomnia   . Asthma   . Allergy   . Renal cell carcinoma 2012    left     Review of Systems  Constitutional: Negative for fever and chills.  Respiratory: Negative for cough, chest tightness and shortness of breath.   Cardiovascular: Negative for chest pain, palpitations and leg swelling.    Musculoskeletal: Positive for back pain.  Neurological: Negative for weakness and numbness.  Psychiatric/Behavioral: Negative for sleep disturbance.      Objective:    BP 132/82 mmHg  Pulse 78  Temp(Src) 98.4 F (36.9 C) (Oral)  Resp 18  Ht 5\' 5"  (1.651 m)  Wt 147 lb 12.8 oz (67.042 kg)  BMI 24.60 kg/m2  SpO2 99% Nursing note and vital signs reviewed.  Physical Exam  Constitutional: She is oriented to person, place, and time. She appears well-developed and well-nourished. No distress.  Cardiovascular: Normal rate, regular rhythm, normal heart sounds and intact distal pulses.   Pulmonary/Chest: Effort normal and breath sounds normal.  Neurological: She is alert and oriented to person, place, and time.  Skin: Skin is warm and dry.  Psychiatric: She has a normal mood and affect. Her behavior is normal. Judgment and thought content normal.       Assessment & Plan:   Problem List Items Addressed This Visit      Respiratory   Asthma - Primary    Stable with current regimen and no recent exacerbations. Continue current doses of albuterol, advair and montelukast. Follow up if symptoms are no longer controlled or worsen.       Relevant Medications   montelukast (SINGULAIR) 10 MG tablet   Seasonal allergies   Relevant Medications   montelukast (SINGULAIR) 10 MG tablet     Musculoskeletal and Integument   Herniated lumbar intervertebral disc    Stable with current pain regimen and denies adverse side effects. Will follow up with Dr. Gladstone Lighter for further management. Continue current dosage of meloxicam, Norco and robaxin. Continue current stool softener for prevention of OIC.  Follow up pending orthopedics appointment.       Relevant Medications   methocarbamol (ROBAXIN) 750 MG tablet   meloxicam (MOBIC) 15 MG tablet   HYDROcodone-acetaminophen (NORCO/VICODIN) 5-325 MG tablet   Arthritis   Relevant Medications   methocarbamol (ROBAXIN) 750 MG tablet   meloxicam (MOBIC) 15  MG tablet   HYDROcodone-acetaminophen (NORCO/VICODIN) 5-325 MG tablet     Other   Hyperlipidemia   Relevant Medications   simvastatin (ZOCOR) 40 MG tablet   Insomnia    Stable with zolpidem and reports adequate sleep with no adverse events noted. Continue current dosage of zolpidem. Follow up if insomnia worsens.

## 2015-10-25 NOTE — Assessment & Plan Note (Signed)
Stable with current regimen and no recent exacerbations. Continue current doses of albuterol, advair and montelukast. Follow up if symptoms are no longer controlled or worsen.

## 2015-10-25 NOTE — Assessment & Plan Note (Addendum)
Stable with current pain regimen and denies adverse side effects. Will follow up with Dr. Gladstone Lighter for further management. Continue current dosage of meloxicam, Norco and robaxin. Continue current stool softener for prevention of OIC.  Follow up pending orthopedics appointment.

## 2015-10-25 NOTE — Patient Instructions (Signed)
Thank you for choosing [facility].  Summary/Instructions:  Your prescription(s) have been submitted to your pharmacy or been printed and provided for you. Please take as directed and contact our office if you believe you are having problem(s) with the medication(s) or have any questions.  If your symptoms worsen or fail to improve, please contact our office for further instruction, or in case of emergency go directly to the emergency room at the closest medical facility.   Please continue to take medications as prescribed.

## 2015-11-21 ENCOUNTER — Other Ambulatory Visit: Payer: Self-pay | Admitting: Orthopedic Surgery

## 2015-11-21 DIAGNOSIS — M48061 Spinal stenosis, lumbar region without neurogenic claudication: Secondary | ICD-10-CM

## 2015-11-28 ENCOUNTER — Ambulatory Visit
Admission: RE | Admit: 2015-11-28 | Discharge: 2015-11-28 | Disposition: A | Discharge status: Home / Self Care | Payer: Medicare HMO | Source: Ambulatory Visit | Attending: Orthopedic Surgery | Admitting: Orthopedic Surgery

## 2015-11-28 DIAGNOSIS — M48061 Spinal stenosis, lumbar region without neurogenic claudication: Secondary | ICD-10-CM

## 2015-11-28 MED ORDER — GADOBENATE DIMEGLUMINE 529 MG/ML IV SOLN
13.0000 mL | Freq: Once | INTRAVENOUS | Status: AC | PRN
  Administered 2015-11-28: 13 mL via INTRAVENOUS

## 2015-12-06 ENCOUNTER — Other Ambulatory Visit (HOSPITAL_COMMUNITY): Payer: Self-pay | Admitting: Orthopedic Surgery

## 2015-12-06 DIAGNOSIS — M4626 Osteomyelitis of vertebra, lumbar region: Secondary | ICD-10-CM

## 2015-12-08 ENCOUNTER — Other Ambulatory Visit: Payer: Self-pay | Admitting: General Surgery

## 2015-12-11 ENCOUNTER — Encounter (HOSPITAL_COMMUNITY): Payer: Self-pay

## 2015-12-11 ENCOUNTER — Ambulatory Visit (HOSPITAL_COMMUNITY)
Admission: RE | Admit: 2015-12-11 | Discharge: 2015-12-11 | Disposition: A | Discharge status: Home / Self Care | Payer: Medicare HMO | Source: Ambulatory Visit | Attending: Orthopedic Surgery | Admitting: Orthopedic Surgery

## 2015-12-11 ENCOUNTER — Other Ambulatory Visit (HOSPITAL_COMMUNITY): Payer: Self-pay | Admitting: Orthopedic Surgery

## 2015-12-11 DIAGNOSIS — M4647 Discitis, unspecified, lumbosacral region: Secondary | ICD-10-CM | POA: Diagnosis not present

## 2015-12-11 DIAGNOSIS — M4626 Osteomyelitis of vertebra, lumbar region: Secondary | ICD-10-CM | POA: Insufficient documentation

## 2015-12-11 HISTORY — DX: Psoriasis, unspecified: L40.9

## 2015-12-11 LAB — CBC WITH DIFFERENTIAL/PLATELET
Basophils Absolute: 0 10*3/uL (ref 0.0–0.1)
Basophils Relative: 0 %
EOS ABS: 0.2 10*3/uL (ref 0.0–0.7)
Eosinophils Relative: 2 %
HEMATOCRIT: 34.4 % — AB (ref 36.0–46.0)
HEMOGLOBIN: 11.5 g/dL — AB (ref 12.0–15.0)
LYMPHS ABS: 2.6 10*3/uL (ref 0.7–4.0)
Lymphocytes Relative: 26 %
MCH: 31.9 pg (ref 26.0–34.0)
MCHC: 33.4 g/dL (ref 30.0–36.0)
MCV: 95.3 fL (ref 78.0–100.0)
MONO ABS: 0.6 10*3/uL (ref 0.1–1.0)
MONOS PCT: 6 %
NEUTROS PCT: 66 %
Neutro Abs: 6.8 10*3/uL (ref 1.7–7.7)
Platelets: 407 10*3/uL — ABNORMAL HIGH (ref 150–400)
RBC: 3.61 MIL/uL — ABNORMAL LOW (ref 3.87–5.11)
RDW: 19.6 % — ABNORMAL HIGH (ref 11.5–15.5)
WBC: 10.3 10*3/uL (ref 4.0–10.5)

## 2015-12-11 LAB — PROTIME-INR
INR: 1.11 (ref 0.00–1.49)
Prothrombin Time: 14.1 seconds (ref 11.6–15.2)

## 2015-12-11 MED ORDER — FENTANYL CITRATE (PF) 100 MCG/2ML IJ SOLN
INTRAMUSCULAR | Status: AC
  Filled 2015-12-11: qty 4

## 2015-12-11 MED ORDER — TRIAMCINOLONE ACETONIDE 0.1 % EX CREA
TOPICAL_CREAM | Freq: Two times a day (BID) | CUTANEOUS | Status: DC
  Administered 2015-12-11: 09:00:00 via TOPICAL
  Filled 2015-12-11: qty 15

## 2015-12-11 MED ORDER — HYDROCODONE-ACETAMINOPHEN 5-325 MG PO TABS
1.0000 | ORAL_TABLET | ORAL | Status: AC | PRN
  Administered 2015-12-11: 1 via ORAL
  Filled 2015-12-11: qty 1

## 2015-12-11 MED ORDER — MIDAZOLAM HCL 2 MG/2ML IJ SOLN
INTRAMUSCULAR | Status: DC
Start: 2015-12-11 — End: 2015-12-12
  Filled 2015-12-11: qty 6

## 2015-12-11 MED ORDER — MIDAZOLAM HCL 2 MG/2ML IJ SOLN
INTRAMUSCULAR | Status: AC | PRN
Start: 2015-12-11 — End: 2015-12-11
  Administered 2015-12-11: 0.5 mg via INTRAVENOUS
  Administered 2015-12-11 (×2): 1 mg via INTRAVENOUS

## 2015-12-11 MED ORDER — FENTANYL CITRATE (PF) 100 MCG/2ML IJ SOLN
INTRAMUSCULAR | Status: AC | PRN
  Administered 2015-12-11: 50 ug via INTRAVENOUS
  Administered 2015-12-11: 25 ug via INTRAVENOUS

## 2015-12-11 MED ORDER — LIDOCAINE HCL (PF) 1 % IJ SOLN
INTRAMUSCULAR | Status: AC
  Filled 2015-12-11: qty 30

## 2015-12-11 MED ORDER — SODIUM CHLORIDE 0.9 % IV SOLN
INTRAVENOUS | Status: DC
  Administered 2015-12-11: 08:00:00 via INTRAVENOUS

## 2015-12-11 NOTE — Sedation Documentation (Signed)
Patient denies pain and is resting comfortably.  

## 2015-12-11 NOTE — Discharge Instructions (Signed)
Moderate Conscious Sedation, Adult °Sedation is the use of medicines to promote relaxation and relieve discomfort and anxiety. Moderate conscious sedation is a type of sedation. Under moderate conscious sedation you are less alert than normal but are still able to respond to instructions or stimulation. Moderate conscious sedation is used during short medical and dental procedures. It is milder than deep sedation or general anesthesia and allows you to return to your regular activities sooner. °LET YOUR HEALTH CARE PROVIDER KNOW ABOUT:  °· Any allergies you have. °· All medicines you are taking, including vitamins, herbs, eye drops, creams, and over-the-counter medicines. °· Use of steroids (by mouth or creams). °· Previous problems you or members of your family have had with the use of anesthetics. °· Any blood disorders you have. °· Previous surgeries you have had. °· Medical conditions you have. °· Possibility of pregnancy, if this applies. °· Use of cigarettes, alcohol, or illegal drugs. °RISKS AND COMPLICATIONS °Generally, this is a safe procedure. However, as with any procedure, problems can occur. Possible problems include: °· Oversedation. °· Trouble breathing on your own. You may need to have a breathing tube until you are awake and breathing on your own. °· Allergic reaction to any of the medicines used for the procedure. °BEFORE THE PROCEDURE °· You may have blood tests done. These tests can help show how well your kidneys and liver are working. They can also show how well your blood clots. °· A physical exam will be done.   °· Only take medicines as directed by your health care provider. You may need to stop taking medicines (such as blood thinners, aspirin, or nonsteroidal anti-inflammatory drugs) before the procedure.   °· Do not eat or drink at least 6 hours before the procedure or as directed by your health care provider. °· Arrange for a responsible adult, family member, or friend to take you home  after the procedure. He or she should stay with you for at least 24 hours after the procedure, until the medicine has worn off. °PROCEDURE  °· An intravenous (IV) catheter will be inserted into one of your veins. Medicine will be able to flow directly into your body through this catheter. You may be given medicine through this tube to help prevent pain and help you relax. °· The medical or dental procedure will be done. °AFTER THE PROCEDURE °· You will stay in a recovery area until the medicine has worn off. Your blood pressure and pulse will be checked.   °·  Depending on the procedure you had, you may be allowed to go home when you can tolerate liquids and your pain is under control. °  °This information is not intended to replace advice given to you by your health care provider. Make sure you discuss any questions you have with your health care provider. °  °Document Released: 07/23/2001 Document Revised: 11/18/2014 Document Reviewed: 07/05/2013 °Elsevier Interactive Patient Education ©2016 [employer] ° °

## 2015-12-11 NOTE — Procedures (Signed)
L5/S1 Disk asp No comp/EBL

## 2015-12-11 NOTE — H&P (Signed)
Chief Complaint: Patient was seen in consultation today for CT-guided aspiration of L5-S1 disc space  Referring Physician(s): Gioffre,Ronald  History of Present Illness: [name] is a 70 y.o. female with history of chronic low back pain with radiation down both lower extremities and recent MRI of lumbar spine on 11/29/15 which revealed chronic L5-S1 inflammatory destructive disc disease, and acute on chronic L1 superior endplate fracture. She is status post L5-S1 hemilaminectomy/microdiscectomy 10/12/14. She has also had prior partial left nephrectomy 2012 secondary to renal cell cancer. She presents today for CT-guided aspiration of the L5-S1 disc space to rule out infection.  Past Medical History  Diagnosis Date  . Seasonal allergies   . Hypercholesteremia     under control  . GERD (gastroesophageal reflux disease)     occasional  . Arthritis     back-severe, hips, right knee  . H/O mumps   . H/O measles   . Insomnia   . Asthma   . Allergy   . Renal cell carcinoma 2012    left  . Psoriasis (a type of skin inflammation)     Past Surgical History  Procedure Laterality Date  . Kidney surgery  12/2010    [facility]; partial nephrectomy  . Hernia repair  [id number]    supraumbilical repair  . Bunionectomy  04/2011  . Menisectomy  2010    left knee  . Cholecystectomy  11/13/2011    Procedure: LAPAROSCOPIC CHOLECYSTECTOMY WITH INTRAOPERATIVE CHOLANGIOGRAM;  Surgeon: Judieth Keens, DO;  Location: [facility];  Service: General;  Laterality: N/A;  . Esophagogastroduodenoscopy N/A 09/25/2013    Procedure: ESOPHAGOGASTRODUODENOSCOPY (EGD);  Surgeon: Lear Ng, MD;  Location: [facility];  Service: Endoscopy;  Laterality: N/A;  . Colonoscopy N/A 09/25/2013    Procedure: COLONOSCOPY;  Surgeon: Lear Ng, MD;  Location: [facility];  Service: Endoscopy;  Laterality: N/A;  . Abdominal hysterectomy  40years ago  . Tubal ligation  44 years ago  . Lumbar  laminectomy/decompression microdiscectomy Right 10/12/2014    Procedure: HEMI LAMINECTOMY MICRODISCECTOMY L5-S1 RIGHT (1 LEVEL);  Surgeon: Tobi Bastos, MD;  Location: [facility];  Service: Orthopedics;  Laterality: Right;    Allergies: Azithromycin  Medications: Prior to Admission medications   Medication Sig Start Date End Date Taking? Authorizing Provider  albuterol (PROVENTIL HFA;VENTOLIN HFA) 108 (90 BASE) MCG/ACT inhaler Inhale 2 puffs into the lungs every 6 (six) hours as needed for wheezing or shortness of breath.    Yes Historical Provider, MD  DULoxetine (CYMBALTA) 60 MG capsule Take 1 capsule (60 mg total) by mouth daily. 10/25/15  Yes Golden Circle, FNP  famotidine (PEPCID) 20 MG tablet Take 1 tablet (20 mg total) by mouth 2 (two) times daily. 10/25/15  Yes Golden Circle, FNP  Fluticasone-Salmeterol (ADVAIR) 500-50 MCG/DOSE AEPB Inhale 1 puff into the lungs 2 (two) times daily.   Yes Historical Provider, MD  HYDROcodone-acetaminophen (NORCO/VICODIN) 5-325 MG tablet Take 1 tablet by mouth every 6 (six) hours as needed for moderate pain. 10/25/15  Yes Golden Circle, FNP  Magnesium Oxide 400 (240 MG) MG TABS Take 1 tablet by mouth daily. 04/02/15  Yes Historical Provider, MD  meloxicam (MOBIC) 15 MG tablet Take 1 tablet (15 mg total) by mouth daily. 10/25/15  Yes Golden Circle, FNP  methocarbamol (ROBAXIN) 750 MG tablet TAKE 1 TABLET BY MOUTH EVERY 8 HOURS AS NEEDED FOR MUSCLE SPASM 10/25/15  Yes Golden Circle, FNP  montelukast (SINGULAIR) 10 MG tablet  Take 1 tablet (10 mg total) by mouth at bedtime. 10/25/15  Yes Golden Circle, FNP  PARoxetine (PAXIL) 10 MG tablet Take 0.5 tablets (5 mg total) by mouth daily. 10/25/15  Yes Golden Circle, FNP  potassium chloride SA (KLOR-CON M20) 20 MEQ tablet Take 1 tablet (20 mEq total) by mouth 2 (two) times daily with a meal. 10/25/15  Yes Golden Circle, FNP  predniSONE (DELTASONE) 10 MG tablet Take 6 tablets x 1 day, 5 tablets  x 1 day, 4 tablets x 1 day, 3 tablets x 1 day, 2 tablets x 1 day, 1 tablet x 1 day 07/24/15  Yes Golden Circle, FNP  simvastatin (ZOCOR) 40 MG tablet Take 1 tablet (40 mg total) by mouth every morning. 10/25/15  Yes Golden Circle, FNP  triamcinolone cream (KENALOG) 0.1 % APPLY TO AFFECTED AREA TWICE A DAY 09/20/15  Yes Golden Circle, FNP  zolpidem (AMBIEN) 5 MG tablet TAKE 1 TABLET BY MOUTH EVERY DAY AT BEDTIME AS NEEDED FOR SLEEP 10/25/15  Yes Golden Circle, FNP  [employer] STOOL SOFTENER 100 MG capsule TAKE 1-2 CAPSULES BY MOUTH DAILY AS NEEDED FOR MILD CONSTIPATION. 09/20/15   Golden Circle, FNP     Family History  Problem Relation Age of Onset  . Hyperlipidemia Mother   . Hypertension Mother     Social History   Social History  . Marital Status: Divorced    Spouse Name: N/A  . Number of Children: 3  . Years of Education: 12   Occupational History  . Retired    Social History Main Topics  . Smoking status: Current Some Day Smoker -- 0.15 packs/day for 6 years    Types: Cigarettes  . Smokeless tobacco: Never Used  . Alcohol Use: 0.0 oz/week    0 Standard drinks or equivalent per week     Comment: socially  . Drug Use: No  . Sexual Activity: Yes   Other Topics Concern  . Not on file   Social History Narrative   Fun: play cards, play with her grandchildren, cooking   Denies religious beliefs effecting health care.       Review of Systems  Constitutional: Negative for fever.  Respiratory: Negative for cough and shortness of breath.   Cardiovascular: Negative for chest pain.  Gastrointestinal: Negative for nausea, vomiting, abdominal pain and blood in stool.  Genitourinary: Negative for dysuria and hematuria.  Musculoskeletal: Positive for back pain.  Neurological: Negative for headaches.    Vital Signs: BP 163/109 mmHg  Pulse 100  Temp(Src) 98.9 F (37.2 C) (Oral)  Resp 18  Ht 5\' 5"  (1.651 m)  Wt 145 lb (65.772 kg)  BMI 24.13 kg/m2  SpO2  100%  Physical Exam  Constitutional: She is oriented to person, place, and time. She appears well-developed and well-nourished.  Cardiovascular:  Sl tachy but regular rhythm  Pulmonary/Chest: Effort normal and breath sounds normal.  Abdominal: Soft. Bowel sounds are normal. There is no tenderness.  Musculoskeletal: Normal range of motion. She exhibits no edema.  Mid low back discomfort to palpation with rad to rt hip/LE's  Neurological: She is alert and oriented to person, place, and time.    Mallampati Score:     Imaging: Mr Lumbar Spine W Wo Contrast  11/29/2015  CLINICAL DATA:  Lumbar spinal stenosis at multiple levels. Question disc space infection. EXAM: MRI LUMBAR SPINE WITHOUT AND WITH CONTRAST TECHNIQUE: Multiplanar and multiecho pulse sequences of the lumbar spine were obtained without  and with intravenous contrast. Creatinine was obtained on site at [facility] at [address] Results: Creatinine 0.7 mg/dL. CONTRAST:  84mL MULTIHANCE GADOBENATE DIMEGLUMINE 529 MG/ML IV SOLN COMPARISON:  Lumbar spine MRI 01/01/2014. Lumbar myelogram 09/19/2014 FINDINGS: There is an L1 superior endplate concavity which is new from 2015 CT and has associated marrow edema and enhancement. There is mild retropulsion at this level, without edema at the retropulsed portion, consistent with an acute on chronic fracture. Advanced disc narrowing from L3-4 to L5-S1 with mixed sclerosis and edema around the L5-S1 level with diffusely indistinct endplates correlating with large erosions on previous CT. The distorted disc is peripherally enhancing. There is no evidence of subligamentous spread of inflammation. No perispinal inflammation or intrathecal collection. When compared to 2015 CT, there has been significant progression of inflammatory erosive changes at L5-S1, but given cross modality differences there has likely been no progression from November 2015 CT. If there has been an interval MRI, would  gladly make comparison. Findings could reflect accelerated and erosive degenerative change, gout, CPPD arthropathy (there was calcification in the herniated L5-S1 disc on CT myelogram but no chondrocalcinosis seen in other locations including on abdominal CT from 2014)or chronic infection. No signs of spondyloarthropathy elsewhere. Degenerative changes: T12- L1: Disc: Distortion of the disc due to L1 superior endplate fracture. No herniation Facets: Negative. Canal: Patent. Foramina: No impingement. L1-L2: Disc: No herniation. Facets: Negative. Canal: Patent. Foramina: No impingement. L2-L3: Disc: Small right paracentral disc protrusion that is chronic and stable. Facets: Small degenerative marginal spurs. Canal: Right-sided preferential ventral thecal sac flattening without impingement or progression Foramina: No impingement. L3-L4: Disc: Disc narrowing and bulging preferentially to the left where there are also greater endplate spurs. Fatty marrow conversion around the degenerated disc with chronic endplate erosions. Facets: Mild arthropathy with marginal spurring. Canal: Patent. Foramina: Mild narrowing on the left from disc bulge, without compression. L4-L5: Disc: Disc narrowing and endplate erosions/irregularity preferentially to the right. Mild disc bulging without herniation Facets: Arthropathy with spurring and narrowing greater on the right. Canal: Patent. Foramina: No impingement. L5-S1: Disc: Inflammatory appearing changes described above. Interval microdiscectomy right paracentral with scar around the descending S1 nerve root in line with the foraminotomy and partial facetectomy. No indication of significant recurrent disc herniation. Facets: Mild degenerative spurring Canal: No suspected impingement. Scar around the descending right S1 nerve without distortion. Foramina: No impingement. IMPRESSION: 1. Chronic L5-S1 inflammatory and destructive disc disease. No specific imaging features and differential  considerations include chronic osteomyelitis, inflammatory degeneration, gout, and pseudogout. Findings are new since most recent comparison MRI in 2015, but likely stable since CT myelogram November 2015. If there has been a more recent MR, would gladly make comparison. 2. Acute on chronic L1 superior endplate fracture with mild height loss. 3. Expected postoperative appearance of L5-S1 microdiscectomy. No degenerative impingement. Electronically Signed   By: Monte Fantasia M.D.   On: 11/29/2015 08:03    Labs:  CBC:  Recent Labs  04/21/15 2319 04/22/15 0540 12/11/15 0810  WBC 7.1 8.8 10.3  HGB 10.7* 10.4* 11.5*  HCT 32.7* 31.5* 34.4*  PLT 258 223 407*    COAGS:  Recent Labs  12/11/15 0810  INR 1.11    BMP:  Recent Labs  04/21/15 2319 04/22/15 0540 05/03/15 1625  NA 142 141 135  K 3.6 3.6 3.9  CL 103 104 100  CO2 26 25 27   GLUCOSE 90 95 82  BUN 11 12 9   CALCIUM 9.6 9.3  9.8  CREATININE 0.62 0.55 0.76  GFRNONAA >60 >60  --   GFRAA >60 >60  --     LIVER FUNCTION TESTS:  Recent Labs  04/21/15 2319 05/03/15 1625  BILITOT 0.7 0.4  AST 76* 32  ALT 46 17  ALKPHOS 349* 294*  PROT 8.2* 8.4*  ALBUMIN 4.0 3.8    TUMOR MARKERS: No results for input(s): AFPTM, CEA, CA199, CHROMGRNA in the last 8760 hours.  Assessment and Plan: 70 y.o. female with history of chronic low back pain with radiation down both lower extremities and recent MRI of lumbar spine on 11/29/15 which revealed chronic L5-S1 inflammatory destructive disc disease, and acute on chronic L1 superior endplate fracture. She is status post L5-S1 hemilaminectomy/microdiscectomy 10/12/14. She has also had prior partial left nephrectomy 2012 secondary to renal cell cancer. She presents today for CT-guided aspiration of the L5-S1 disc space to rule out infection. Details/risks of procedure, including but not limited to, internal bleeding, infection discussed with patient/husband with their understanding and  consent.      Thank you for this interesting consult.  I greatly enjoyed meeting [name] and look forward to participating in their care.  A copy of this report was sent to the requesting provider on this date.  Electronically Signed: D. Rowe Robert 12/11/2015, 9:19 AM   I spent a total of 15 minutes in face to face in clinical consultation, greater than 50% of which was counseling/coordinating care for CT-guided L5-S1 disc space aspiration

## 2015-12-15 LAB — TISSUE CULTURE: CULTURE: NO GROWTH

## 2015-12-15 LAB — BODY FLUID CULTURE: Culture: NO GROWTH

## 2016-01-02 ENCOUNTER — Other Ambulatory Visit: Payer: Self-pay | Admitting: Family

## 2016-01-02 ENCOUNTER — Encounter (HOSPITAL_COMMUNITY): Payer: Self-pay | Admitting: Emergency Medicine

## 2016-01-02 ENCOUNTER — Inpatient Hospital Stay (HOSPITAL_COMMUNITY)
Admission: EM | Admit: 2016-01-02 | Discharge: 2016-01-05 | DRG: 378 | Disposition: A | Discharge status: Home / Self Care | Payer: Medicare HMO | Attending: Internal Medicine | Admitting: Internal Medicine

## 2016-01-02 DIAGNOSIS — K922 Gastrointestinal hemorrhage, unspecified: Secondary | ICD-10-CM | POA: Diagnosis present

## 2016-01-02 DIAGNOSIS — R109 Unspecified abdominal pain: Secondary | ICD-10-CM | POA: Diagnosis present

## 2016-01-02 DIAGNOSIS — K226 Gastro-esophageal laceration-hemorrhage syndrome: Secondary | ICD-10-CM | POA: Diagnosis present

## 2016-01-02 DIAGNOSIS — K222 Esophageal obstruction: Secondary | ICD-10-CM | POA: Diagnosis present

## 2016-01-02 DIAGNOSIS — E785 Hyperlipidemia, unspecified: Secondary | ICD-10-CM | POA: Diagnosis present

## 2016-01-02 DIAGNOSIS — E7800 Pure hypercholesterolemia, unspecified: Secondary | ICD-10-CM | POA: Diagnosis present

## 2016-01-02 DIAGNOSIS — F10239 Alcohol dependence with withdrawal, unspecified: Secondary | ICD-10-CM | POA: Diagnosis present

## 2016-01-02 DIAGNOSIS — M1990 Unspecified osteoarthritis, unspecified site: Secondary | ICD-10-CM | POA: Diagnosis present

## 2016-01-02 DIAGNOSIS — F1010 Alcohol abuse, uncomplicated: Secondary | ICD-10-CM

## 2016-01-02 DIAGNOSIS — R1033 Periumbilical pain: Secondary | ICD-10-CM | POA: Diagnosis present

## 2016-01-02 DIAGNOSIS — K219 Gastro-esophageal reflux disease without esophagitis: Secondary | ICD-10-CM | POA: Diagnosis present

## 2016-01-02 DIAGNOSIS — R000 Tachycardia, unspecified: Secondary | ICD-10-CM | POA: Diagnosis present

## 2016-01-02 DIAGNOSIS — K254 Chronic or unspecified gastric ulcer with hemorrhage: Principal | ICD-10-CM | POA: Diagnosis present

## 2016-01-02 DIAGNOSIS — Z881 Allergy status to other antibiotic agents status: Secondary | ICD-10-CM

## 2016-01-02 DIAGNOSIS — Z85528 Personal history of other malignant neoplasm of kidney: Secondary | ICD-10-CM

## 2016-01-02 DIAGNOSIS — F329 Major depressive disorder, single episode, unspecified: Secondary | ICD-10-CM | POA: Diagnosis present

## 2016-01-02 DIAGNOSIS — Z86010 Personal history of colonic polyps: Secondary | ICD-10-CM

## 2016-01-02 DIAGNOSIS — K920 Hematemesis: Secondary | ICD-10-CM | POA: Diagnosis present

## 2016-01-02 DIAGNOSIS — K760 Fatty (change of) liver, not elsewhere classified: Secondary | ICD-10-CM | POA: Diagnosis present

## 2016-01-02 DIAGNOSIS — Z8249 Family history of ischemic heart disease and other diseases of the circulatory system: Secondary | ICD-10-CM

## 2016-01-02 DIAGNOSIS — F32A Depression, unspecified: Secondary | ICD-10-CM | POA: Diagnosis present

## 2016-01-02 DIAGNOSIS — J45909 Unspecified asthma, uncomplicated: Secondary | ICD-10-CM | POA: Diagnosis present

## 2016-01-02 DIAGNOSIS — L409 Psoriasis, unspecified: Secondary | ICD-10-CM | POA: Diagnosis present

## 2016-01-02 DIAGNOSIS — Z720 Tobacco use: Secondary | ICD-10-CM | POA: Diagnosis present

## 2016-01-02 DIAGNOSIS — R197 Diarrhea, unspecified: Secondary | ICD-10-CM | POA: Diagnosis present

## 2016-01-02 DIAGNOSIS — F10939 Alcohol use, unspecified with withdrawal, unspecified: Secondary | ICD-10-CM | POA: Diagnosis present

## 2016-01-02 DIAGNOSIS — Z90710 Acquired absence of both cervix and uterus: Secondary | ICD-10-CM

## 2016-01-02 DIAGNOSIS — Z79899 Other long term (current) drug therapy: Secondary | ICD-10-CM

## 2016-01-02 DIAGNOSIS — D62 Acute posthemorrhagic anemia: Secondary | ICD-10-CM | POA: Diagnosis present

## 2016-01-02 DIAGNOSIS — F17210 Nicotine dependence, cigarettes, uncomplicated: Secondary | ICD-10-CM | POA: Diagnosis present

## 2016-01-02 HISTORY — DX: Alcohol abuse, uncomplicated: F10.10

## 2016-01-02 MED ORDER — SODIUM CHLORIDE 0.9 % IV BOLUS (SEPSIS)
1000.0000 mL | Freq: Once | INTRAVENOUS | Status: AC
  Administered 2016-01-03: 1000 mL via INTRAVENOUS

## 2016-01-02 MED ORDER — PANTOPRAZOLE SODIUM 40 MG IV SOLR
40.0000 mg | Freq: Once | INTRAVENOUS | Status: AC
  Administered 2016-01-03: 40 mg via INTRAVENOUS
  Filled 2016-01-02: qty 40

## 2016-01-02 NOTE — ED Provider Notes (Signed)
CSN: [id number]     Arrival date & time 01/02/16  2227 History   First MD Initiated Contact with Patient 01/02/16 2352     Chief Complaint  Patient presents with  . GI Bleeding     The history is provided by the patient. No language interpreter was used.   [name] is a 70 y.o. female who presents to the Emergency Department complaining of vomiting blood.  She developed profuse hematemesis as well as black stools starting about 5 PM today. She reports 6 episodes of bright red blood with clots mixed in. She has associated epigastric pain. No prior similar symptoms. She is an occasional alcohol drinker but drink heavily a few days ago. She takes ibuprofen for her back pain. She is on no blood thinners. Symptoms are severe, constant, worsening.  Past Medical History  Diagnosis Date  . Seasonal allergies   . Hypercholesteremia     under control  . GERD (gastroesophageal reflux disease)     occasional  . Arthritis     back-severe, hips, right knee  . H/O mumps   . H/O measles   . Insomnia   . Asthma   . Allergy   . Renal cell carcinoma 2012    left  . Psoriasis (a type of skin inflammation)    Past Surgical History  Procedure Laterality Date  . Kidney surgery  12/2010    [facility]; partial nephrectomy  . Hernia repair  [id number]    supraumbilical repair  . Bunionectomy  04/2011  . Menisectomy  2010    left knee  . Cholecystectomy  11/13/2011    Procedure: LAPAROSCOPIC CHOLECYSTECTOMY WITH INTRAOPERATIVE CHOLANGIOGRAM;  Surgeon: Judieth Keens, DO;  Location: [facility];  Service: General;  Laterality: N/A;  . Esophagogastroduodenoscopy N/A 09/25/2013    Procedure: ESOPHAGOGASTRODUODENOSCOPY (EGD);  Surgeon: Lear Ng, MD;  Location: [facility];  Service: Endoscopy;  Laterality: N/A;  . Colonoscopy N/A 09/25/2013    Procedure: COLONOSCOPY;  Surgeon: Lear Ng, MD;  Location: [facility];  Service: Endoscopy;  Laterality: N/A;  . Abdominal hysterectomy  40years ago   . Tubal ligation  44 years ago  . Lumbar laminectomy/decompression microdiscectomy Right 10/12/2014    Procedure: HEMI LAMINECTOMY MICRODISCECTOMY L5-S1 RIGHT (1 LEVEL);  Surgeon: Tobi Bastos, MD;  Location: [facility];  Service: Orthopedics;  Laterality: Right;   Family History  Problem Relation Age of Onset  . Hyperlipidemia Mother   . Hypertension Mother    Social History  Substance Use Topics  . Smoking status: Current Some Day Smoker -- 0.15 packs/day for 6 years    Types: Cigarettes  . Smokeless tobacco: Never Used  . Alcohol Use: 0.0 oz/week    0 Standard drinks or equivalent per week     Comment: socially   OB History    Gravida Para Term Preterm AB TAB SAB Ectopic Multiple Living   3 3        3      Review of Systems  All other systems reviewed and are negative.     Allergies  Azithromycin  Home Medications   Prior to Admission medications   Medication Sig Start Date End Date Taking? Authorizing Provider  albuterol (PROVENTIL HFA;VENTOLIN HFA) 108 (90 BASE) MCG/ACT inhaler Inhale 2 puffs into the lungs every 6 (six) hours as needed for wheezing or shortness of breath.    Yes Historical Provider, MD  [employer] STOOL SOFTENER 100 MG capsule TAKE 1-2 CAPSULES BY MOUTH DAILY AS  NEEDED FOR MILD CONSTIPATION. 09/20/15  Yes Golden Circle, FNP  DULoxetine (CYMBALTA) 60 MG capsule Take 1 capsule (60 mg total) by mouth daily. 10/25/15  Yes Golden Circle, FNP  famotidine (PEPCID) 20 MG tablet Take 1 tablet (20 mg total) by mouth 2 (two) times daily. 10/25/15  Yes Golden Circle, FNP  Fluticasone-Salmeterol (ADVAIR) 500-50 MCG/DOSE AEPB Inhale 1 puff into the lungs 2 (two) times daily.   Yes Historical Provider, MD  Magnesium Oxide 400 (240 MG) MG TABS Take 1 tablet by mouth daily. 04/02/15  Yes Historical Provider, MD  meloxicam (MOBIC) 15 MG tablet Take 1 tablet (15 mg total) by mouth daily. 10/25/15  Yes Golden Circle, FNP  montelukast (SINGULAIR) 10 MG tablet Take 1  tablet (10 mg total) by mouth at bedtime. 10/25/15  Yes Golden Circle, FNP  PARoxetine (PAXIL) 10 MG tablet Take 0.5 tablets (5 mg total) by mouth daily. 10/25/15  Yes Golden Circle, FNP  potassium chloride SA (KLOR-CON M20) 20 MEQ tablet Take 1 tablet (20 mEq total) by mouth 2 (two) times daily with a meal. 10/25/15  Yes Golden Circle, FNP  simvastatin (ZOCOR) 40 MG tablet Take 1 tablet (40 mg total) by mouth every morning. 10/25/15  Yes Golden Circle, FNP  triamcinolone cream (KENALOG) 0.1 % APPLY TO AFFECTED AREA TWICE A DAY 09/20/15  Yes Golden Circle, FNP  zolpidem (AMBIEN) 5 MG tablet TAKE 1 TABLET BY MOUTH EVERY DAY AT BEDTIME AS NEEDED FOR SLEEP 10/25/15  Yes Golden Circle, FNP  HYDROcodone-acetaminophen (NORCO/VICODIN) 5-325 MG tablet Take 1 tablet by mouth every 6 (six) hours as needed for moderate pain. Patient not taking: Reported on 01/03/2016 10/25/15   Golden Circle, FNP  methocarbamol (ROBAXIN) 750 MG tablet TAKE 1 TABLET BY MOUTH EVERY 8 HOURS AS NEEDED FOR MUSCLE SPASM Patient not taking: Reported on 01/03/2016 10/25/15   Golden Circle, FNP  predniSONE (DELTASONE) 10 MG tablet Take 6 tablets x 1 day, 5 tablets x 1 day, 4 tablets x 1 day, 3 tablets x 1 day, 2 tablets x 1 day, 1 tablet x 1 day Patient not taking: Reported on 01/03/2016 07/24/15   Golden Circle, FNP   BP 107/95 mmHg  Pulse 110  Temp(Src) 97.8 F (36.6 C) (Axillary)  Resp 13  Ht 5\' 5"  (1.651 m)  Wt 145 lb (65.772 kg)  BMI 24.13 kg/m2  SpO2 100% Physical Exam  Constitutional: She is oriented to person, place, and time. She appears well-developed and well-nourished.  HENT:  Head: Normocephalic and atraumatic.  Cardiovascular: Regular rhythm.   No murmur heard. Tachycardic  Pulmonary/Chest: Effort normal and breath sounds normal. No respiratory distress.  Abdominal: Soft. There is no tenderness. There is no rebound and no guarding.  Genitourinary:  Black stool, heme positive   Musculoskeletal: She exhibits no edema or tenderness.  Neurological: She is alert and oriented to person, place, and time.  Skin: Skin is warm and dry.  Psychiatric: She has a normal mood and affect. Her behavior is normal.  Nursing note and vitals reviewed.   ED Course  Procedures  CRITICAL CARE Performed by: Quintella Reichert   Total critical care time: 30 minutes  Critical care time was exclusive of separately billable procedures and treating other patients.  Critical care was necessary to treat or prevent imminent or life-threatening deterioration.  Critical care was time spent personally by me on the following activities: development of treatment plan with patient  and/or surrogate as well as nursing, discussions with consultants, evaluation of patient's response to treatment, examination of patient, obtaining history from patient or surrogate, ordering and performing treatments and interventions, ordering and review of laboratory studies, ordering and review of radiographic studies, pulse oximetry and re-evaluation of patient's condition.  Labs Review Labs Reviewed  COMPREHENSIVE METABOLIC PANEL - Abnormal; Notable for the following:    CO2 21 (*)    Glucose, Bld 160 (*)    BUN 38 (*)    Creatinine, Ser 1.07 (*)    Albumin 3.3 (*)    AST 64 (*)    Alkaline Phosphatase 255 (*)    Total Bilirubin 1.8 (*)    GFR calc non Af Amer 52 (*)    All other components within normal limits  CBC WITH DIFFERENTIAL/PLATELET - Abnormal; Notable for the following:    RBC 2.32 (*)    Hemoglobin 7.4 (*)    HCT 23.2 (*)    RDW 19.9 (*)    All other components within normal limits  I-STAT CHEM 8, ED - Abnormal; Notable for the following:    BUN 37 (*)    Creatinine, Ser 1.20 (*)    Glucose, Bld 150 (*)    Calcium, Ion 1.08 (*)    Hemoglobin 9.5 (*)    HCT 28.0 (*)    All other components within normal limits  ETHANOL  PROTIME-INR  LIPASE, BLOOD  TYPE AND SCREEN  PREPARE RBC  (CROSSMATCH)    Imaging Review No results found. I have personally reviewed and evaluated these images and lab results as part of my medical decision-making.   EKG Interpretation None      MDM   Final diagnoses:  None   Pt here with hematemesis, melena, takes daily ibuprofen.  Patient with melanotic stool in the emergency department. She is hypotensive on ED arrival, BP improved with IV fluid administration but she is persistently tachycardic. Started on Protonix drip as well as octreotide trip given her questionable history of alcohol abuse. Discussed with Dr. Michail Sermon with GI, he will see the patient in consult. Discussed the case with critical care, recommend hospitalist admission, critical care to be complicated if additional interventions are needed. Patient with hemoglobin of 7 in the department, will transfuse 2 units for ongoing GI bleed.    Quintella Reichert, MD 01/03/16 [phone]

## 2016-01-02 NOTE — ED Notes (Signed)
Pt c/o bright red emesis onset 1400 yesterday x 5, dark red stool. Pt states she had heavy etoh usage day before yesterday and feels this is the cause, pt states she does not use etoh heavily, visitor at bedside states he does drink "like a fish"

## 2016-01-02 NOTE — ED Notes (Signed)
Pt took to 2 Advil at 6pm and vomited them back up

## 2016-01-03 ENCOUNTER — Inpatient Hospital Stay (HOSPITAL_COMMUNITY): Payer: Medicare HMO | Admitting: Anesthesiology

## 2016-01-03 ENCOUNTER — Inpatient Hospital Stay (HOSPITAL_COMMUNITY): Payer: Medicare HMO

## 2016-01-03 ENCOUNTER — Encounter (HOSPITAL_COMMUNITY)
Admission: EM | Disposition: A | Discharge status: Home / Self Care | Payer: Self-pay | Source: Home / Self Care | Attending: Internal Medicine

## 2016-01-03 ENCOUNTER — Encounter (HOSPITAL_COMMUNITY): Payer: Self-pay | Admitting: Internal Medicine

## 2016-01-03 DIAGNOSIS — J4530 Mild persistent asthma, uncomplicated: Secondary | ICD-10-CM | POA: Diagnosis not present

## 2016-01-03 DIAGNOSIS — Z881 Allergy status to other antibiotic agents status: Secondary | ICD-10-CM | POA: Diagnosis not present

## 2016-01-03 DIAGNOSIS — K219 Gastro-esophageal reflux disease without esophagitis: Secondary | ICD-10-CM | POA: Diagnosis present

## 2016-01-03 DIAGNOSIS — R109 Unspecified abdominal pain: Secondary | ICD-10-CM | POA: Diagnosis present

## 2016-01-03 DIAGNOSIS — F17210 Nicotine dependence, cigarettes, uncomplicated: Secondary | ICD-10-CM | POA: Diagnosis present

## 2016-01-03 DIAGNOSIS — E785 Hyperlipidemia, unspecified: Secondary | ICD-10-CM | POA: Diagnosis present

## 2016-01-03 DIAGNOSIS — D62 Acute posthemorrhagic anemia: Secondary | ICD-10-CM | POA: Diagnosis present

## 2016-01-03 DIAGNOSIS — K226 Gastro-esophageal laceration-hemorrhage syndrome: Secondary | ICD-10-CM | POA: Diagnosis present

## 2016-01-03 DIAGNOSIS — Z90710 Acquired absence of both cervix and uterus: Secondary | ICD-10-CM | POA: Diagnosis not present

## 2016-01-03 DIAGNOSIS — F1010 Alcohol abuse, uncomplicated: Secondary | ICD-10-CM | POA: Diagnosis present

## 2016-01-03 DIAGNOSIS — L409 Psoriasis, unspecified: Secondary | ICD-10-CM | POA: Diagnosis present

## 2016-01-03 DIAGNOSIS — J45909 Unspecified asthma, uncomplicated: Secondary | ICD-10-CM | POA: Diagnosis present

## 2016-01-03 DIAGNOSIS — K920 Hematemesis: Secondary | ICD-10-CM | POA: Diagnosis present

## 2016-01-03 DIAGNOSIS — F329 Major depressive disorder, single episode, unspecified: Secondary | ICD-10-CM | POA: Diagnosis present

## 2016-01-03 DIAGNOSIS — K760 Fatty (change of) liver, not elsewhere classified: Secondary | ICD-10-CM | POA: Diagnosis present

## 2016-01-03 DIAGNOSIS — K922 Gastrointestinal hemorrhage, unspecified: Secondary | ICD-10-CM | POA: Diagnosis present

## 2016-01-03 DIAGNOSIS — F32A Depression, unspecified: Secondary | ICD-10-CM | POA: Diagnosis present

## 2016-01-03 DIAGNOSIS — F10230 Alcohol dependence with withdrawal, uncomplicated: Secondary | ICD-10-CM | POA: Diagnosis not present

## 2016-01-03 DIAGNOSIS — R1013 Epigastric pain: Secondary | ICD-10-CM | POA: Diagnosis not present

## 2016-01-03 DIAGNOSIS — K222 Esophageal obstruction: Secondary | ICD-10-CM | POA: Diagnosis present

## 2016-01-03 DIAGNOSIS — M1990 Unspecified osteoarthritis, unspecified site: Secondary | ICD-10-CM | POA: Diagnosis present

## 2016-01-03 DIAGNOSIS — R197 Diarrhea, unspecified: Secondary | ICD-10-CM | POA: Diagnosis present

## 2016-01-03 DIAGNOSIS — Z8249 Family history of ischemic heart disease and other diseases of the circulatory system: Secondary | ICD-10-CM | POA: Diagnosis not present

## 2016-01-03 DIAGNOSIS — R110 Nausea: Secondary | ICD-10-CM

## 2016-01-03 DIAGNOSIS — R000 Tachycardia, unspecified: Secondary | ICD-10-CM | POA: Diagnosis present

## 2016-01-03 DIAGNOSIS — E7800 Pure hypercholesterolemia, unspecified: Secondary | ICD-10-CM | POA: Diagnosis present

## 2016-01-03 DIAGNOSIS — R1033 Periumbilical pain: Secondary | ICD-10-CM | POA: Diagnosis present

## 2016-01-03 DIAGNOSIS — Z86010 Personal history of colonic polyps: Secondary | ICD-10-CM | POA: Diagnosis not present

## 2016-01-03 DIAGNOSIS — Z720 Tobacco use: Secondary | ICD-10-CM | POA: Diagnosis present

## 2016-01-03 DIAGNOSIS — Z85528 Personal history of other malignant neoplasm of kidney: Secondary | ICD-10-CM | POA: Diagnosis not present

## 2016-01-03 DIAGNOSIS — K254 Chronic or unspecified gastric ulcer with hemorrhage: Secondary | ICD-10-CM | POA: Diagnosis present

## 2016-01-03 DIAGNOSIS — Z79899 Other long term (current) drug therapy: Secondary | ICD-10-CM | POA: Diagnosis not present

## 2016-01-03 DIAGNOSIS — F10239 Alcohol dependence with withdrawal, unspecified: Secondary | ICD-10-CM | POA: Diagnosis present

## 2016-01-03 HISTORY — PX: ESOPHAGOGASTRODUODENOSCOPY (EGD) WITH PROPOFOL: SHX5813

## 2016-01-03 LAB — COMPREHENSIVE METABOLIC PANEL
ALBUMIN: 3.3 g/dL — AB (ref 3.5–5.0)
ALK PHOS: 255 U/L — AB (ref 38–126)
ALT: 22 U/L (ref 14–54)
ALT: 30 U/L (ref 14–54)
ANION GAP: 9 (ref 5–15)
AST: 46 U/L — ABNORMAL HIGH (ref 15–41)
AST: 64 U/L — AB (ref 15–41)
Albumin: 2.9 g/dL — ABNORMAL LOW (ref 3.5–5.0)
Alkaline Phosphatase: 196 U/L — ABNORMAL HIGH (ref 38–126)
Anion gap: 15 (ref 5–15)
BILIRUBIN TOTAL: 1.8 mg/dL — AB (ref 0.3–1.2)
BUN: 37 mg/dL — ABNORMAL HIGH (ref 6–20)
BUN: 38 mg/dL — AB (ref 6–20)
CHLORIDE: 107 mmol/L (ref 101–111)
CO2: 20 mmol/L — AB (ref 22–32)
CO2: 21 mmol/L — ABNORMAL LOW (ref 22–32)
CREATININE: 0.81 mg/dL (ref 0.44–1.00)
CREATININE: 1.07 mg/dL — AB (ref 0.44–1.00)
Calcium: 7.9 mg/dL — ABNORMAL LOW (ref 8.9–10.3)
Calcium: 9.2 mg/dL (ref 8.9–10.3)
Chloride: 103 mmol/L (ref 101–111)
GFR calc Af Amer: >60 mL/min (ref >60)
GFR, EST NON AFRICAN AMERICAN: 52 mL/min — AB (ref >60)
GLUCOSE: 160 mg/dL — AB (ref 65–99)
Glucose, Bld: 81 mg/dL (ref 65–99)
Potassium: 4 mmol/L (ref 3.5–5.1)
Potassium: 4.2 mmol/L (ref 3.5–5.1)
SODIUM: 136 mmol/L (ref 135–145)
Sodium: 139 mmol/L (ref 135–145)
TOTAL PROTEIN: 6.7 g/dL (ref 6.5–8.1)
Total Bilirubin: 1.1 mg/dL (ref 0.3–1.2)
Total Protein: 5.6 g/dL — ABNORMAL LOW (ref 6.5–8.1)

## 2016-01-03 LAB — CBC WITH DIFFERENTIAL/PLATELET
BASOS ABS: 0 10*3/uL (ref 0.0–0.1)
BASOS PCT: 0 %
EOS ABS: 0.1 10*3/uL (ref 0.0–0.7)
EOS PCT: 1 %
HCT: 23.2 % — ABNORMAL LOW (ref 36.0–46.0)
Hemoglobin: 7.4 g/dL — ABNORMAL LOW (ref 12.0–15.0)
Lymphocytes Relative: 20 %
Lymphs Abs: 1.9 10*3/uL (ref 0.7–4.0)
MCH: 31.9 pg (ref 26.0–34.0)
MCHC: 31.9 g/dL (ref 30.0–36.0)
MCV: 100 fL (ref 78.0–100.0)
MONO ABS: 0.5 10*3/uL (ref 0.1–1.0)
Monocytes Relative: 5 %
Neutro Abs: 7 10*3/uL (ref 1.7–7.7)
Neutrophils Relative %: 74 %
PLATELETS: 208 10*3/uL (ref 150–400)
RBC: 2.32 MIL/uL — ABNORMAL LOW (ref 3.87–5.11)
RDW: 19.9 % — AB (ref 11.5–15.5)
WBC: 9.6 10*3/uL (ref 4.0–10.5)

## 2016-01-03 LAB — LIPID PANEL
CHOL/HDL RATIO: 3 ratio
Cholesterol: 141 mg/dL (ref 0–200)
HDL: 47 mg/dL (ref >40)
LDL CALC: 72 mg/dL (ref 0–99)
TRIGLYCERIDES: 111 mg/dL (ref <150)
VLDL: 22 mg/dL (ref 0–40)

## 2016-01-03 LAB — C DIFFICILE QUICK SCREEN W PCR REFLEX
C Diff antigen: POSITIVE — AB
C Diff toxin: NEGATIVE

## 2016-01-03 LAB — PROTIME-INR
INR: 1.22 (ref 0.00–1.49)
PROTHROMBIN TIME: 15.1 s (ref 11.6–15.2)

## 2016-01-03 LAB — ETHANOL: Alcohol, Ethyl (B): <5 mg/dL (ref <5)

## 2016-01-03 LAB — I-STAT CHEM 8, ED
BUN: 37 mg/dL — AB (ref 6–20)
CREATININE: 1.2 mg/dL — AB (ref 0.44–1.00)
Calcium, Ion: 1.08 mmol/L — ABNORMAL LOW (ref 1.13–1.30)
Chloride: 102 mmol/L (ref 101–111)
Glucose, Bld: 150 mg/dL — ABNORMAL HIGH (ref 65–99)
HEMATOCRIT: 28 % — AB (ref 36.0–46.0)
HEMOGLOBIN: 9.5 g/dL — AB (ref 12.0–15.0)
POTASSIUM: 4.4 mmol/L (ref 3.5–5.1)
SODIUM: 139 mmol/L (ref 135–145)
TCO2: 22 mmol/L (ref 0–100)

## 2016-01-03 LAB — PREPARE RBC (CROSSMATCH)

## 2016-01-03 LAB — BILIRUBIN, DIRECT: Bilirubin, Direct: 0.3 mg/dL (ref 0.1–0.5)

## 2016-01-03 LAB — MRSA PCR SCREENING: MRSA BY PCR: NEGATIVE

## 2016-01-03 LAB — APTT: aPTT: 32 seconds (ref 24–37)

## 2016-01-03 LAB — LIPASE, BLOOD: Lipase: 17 U/L (ref 11–51)

## 2016-01-03 SURGERY — ESOPHAGOGASTRODUODENOSCOPY (EGD) WITH PROPOFOL
Anesthesia: Monitor Anesthesia Care | Laterality: Left

## 2016-01-03 MED ORDER — PAROXETINE HCL 10 MG PO TABS
5.0000 mg | ORAL_TABLET | Freq: Every day | ORAL | Status: DC
  Administered 2016-01-03 – 2016-01-05 (×3): 5 mg via ORAL
  Filled 2016-01-03 (×3): qty 0.5

## 2016-01-03 MED ORDER — SIMVASTATIN 40 MG PO TABS
40.0000 mg | ORAL_TABLET | Freq: Every day | ORAL | Status: DC
  Administered 2016-01-03 – 2016-01-04 (×2): 40 mg via ORAL
  Filled 2016-01-03 (×3): qty 1

## 2016-01-03 MED ORDER — OCTREOTIDE LOAD VIA INFUSION
50.0000 ug | Freq: Once | INTRAVENOUS | Status: AC
Start: 2016-01-03 — End: 2016-01-03
  Administered 2016-01-03: 50 ug via INTRAVENOUS
  Filled 2016-01-03: qty 25

## 2016-01-03 MED ORDER — LORAZEPAM 2 MG/ML IJ SOLN
1.0000 mg | Freq: Four times a day (QID) | INTRAMUSCULAR | Status: DC | PRN
  Administered 2016-01-04: 1 mg via INTRAVENOUS
  Filled 2016-01-03: qty 1

## 2016-01-03 MED ORDER — LACTATED RINGERS IV SOLN
INTRAVENOUS | Status: DC | PRN
Start: 2016-01-03 — End: 2016-01-03
  Administered 2016-01-03: 11:00:00 via INTRAVENOUS

## 2016-01-03 MED ORDER — MORPHINE SULFATE (PF) 2 MG/ML IV SOLN
2.0000 mg | INTRAVENOUS | Status: DC | PRN
  Administered 2016-01-03 (×4): 2 mg via INTRAVENOUS
  Filled 2016-01-03 (×4): qty 1

## 2016-01-03 MED ORDER — SODIUM CHLORIDE 0.9 % IV BOLUS (SEPSIS)
1000.0000 mL | Freq: Once | INTRAVENOUS | Status: AC
  Administered 2016-01-03: 1000 mL via INTRAVENOUS

## 2016-01-03 MED ORDER — SODIUM CHLORIDE 0.9 % IV SOLN
50.0000 ug/h | INTRAVENOUS | Status: DC
  Administered 2016-01-03 – 2016-01-04 (×3): 50 ug/h via INTRAVENOUS
  Filled 2016-01-03 (×6): qty 1

## 2016-01-03 MED ORDER — MOMETASONE FURO-FORMOTEROL FUM 200-5 MCG/ACT IN AERO
2.0000 | INHALATION_SPRAY | Freq: Two times a day (BID) | RESPIRATORY_TRACT | Status: DC
  Administered 2016-01-03 – 2016-01-05 (×4): 2 via RESPIRATORY_TRACT
  Filled 2016-01-03: qty 8.8

## 2016-01-03 MED ORDER — ONDANSETRON HCL 4 MG/2ML IJ SOLN
4.0000 mg | Freq: Once | INTRAMUSCULAR | Status: AC
  Administered 2016-01-03: 4 mg via INTRAVENOUS
  Filled 2016-01-03: qty 2

## 2016-01-03 MED ORDER — IOHEXOL 300 MG/ML  SOLN
25.0000 mL | INTRAMUSCULAR | Status: AC
  Administered 2016-01-03 (×2): 25 mL via ORAL

## 2016-01-03 MED ORDER — LORAZEPAM 2 MG/ML IJ SOLN
0.0000 mg | Freq: Two times a day (BID) | INTRAMUSCULAR | Status: DC
Start: 2016-01-05 — End: 2016-01-04

## 2016-01-03 MED ORDER — SODIUM CHLORIDE 0.9 % IV SOLN: INTRAVENOUS | Status: DC

## 2016-01-03 MED ORDER — PROPOFOL 500 MG/50ML IV EMUL
INTRAVENOUS | Status: DC | PRN
  Administered 2016-01-03: 120 ug/kg/min via INTRAVENOUS

## 2016-01-03 MED ORDER — ONDANSETRON HCL 4 MG/2ML IJ SOLN: 4.0000 mg | Freq: Three times a day (TID) | INTRAMUSCULAR | Status: DC | PRN

## 2016-01-03 MED ORDER — FOLIC ACID 1 MG PO TABS
1.0000 mg | ORAL_TABLET | Freq: Every day | ORAL | Status: DC
  Administered 2016-01-04 – 2016-01-05 (×2): 1 mg via ORAL
  Filled 2016-01-03 (×3): qty 1

## 2016-01-03 MED ORDER — PROPOFOL 10 MG/ML IV BOLUS
INTRAVENOUS | Status: AC
Start: 2016-01-03 — End: 2016-01-03
  Filled 2016-01-03: qty 40

## 2016-01-03 MED ORDER — LACTATED RINGERS IV SOLN: INTRAVENOUS | Status: DC

## 2016-01-03 MED ORDER — FENTANYL CITRATE (PF) 100 MCG/2ML IJ SOLN
50.0000 ug | Freq: Once | INTRAMUSCULAR | Status: AC
  Administered 2016-01-03: 50 ug via INTRAVENOUS
  Filled 2016-01-03: qty 2

## 2016-01-03 MED ORDER — SODIUM CHLORIDE 0.9% FLUSH
3.0000 mL | Freq: Two times a day (BID) | INTRAVENOUS | Status: DC
  Administered 2016-01-03 – 2016-01-04 (×4): 3 mL via INTRAVENOUS

## 2016-01-03 MED ORDER — SODIUM CHLORIDE 0.9 % IV SOLN
80.0000 mg | Freq: Once | INTRAVENOUS | Status: AC
  Administered 2016-01-03: 80 mg via INTRAVENOUS
  Filled 2016-01-03: qty 80

## 2016-01-03 MED ORDER — DULOXETINE HCL 60 MG PO CPEP
60.0000 mg | ORAL_CAPSULE | Freq: Every day | ORAL | Status: DC
  Administered 2016-01-03 – 2016-01-05 (×3): 60 mg via ORAL
  Filled 2016-01-03: qty 2
  Filled 2016-01-03 (×2): qty 1
  Filled 2016-01-03: qty 2

## 2016-01-03 MED ORDER — MAGNESIUM OXIDE 400 (241.3 MG) MG PO TABS
400.0000 mg | ORAL_TABLET | Freq: Every day | ORAL | Status: DC
  Administered 2016-01-04 – 2016-01-05 (×2): 400 mg via ORAL
  Filled 2016-01-03 (×3): qty 1

## 2016-01-03 MED ORDER — SODIUM CHLORIDE 0.9 % IV SOLN
INTRAVENOUS | Status: DC
  Administered 2016-01-03: 03:00:00 via INTRAVENOUS

## 2016-01-03 MED ORDER — SODIUM CHLORIDE 0.9 % IV SOLN
8.0000 mg/h | INTRAVENOUS | Status: DC
  Administered 2016-01-03 – 2016-01-04 (×4): 8 mg/h via INTRAVENOUS
  Filled 2016-01-03 (×6): qty 80

## 2016-01-03 MED ORDER — PANTOPRAZOLE SODIUM 40 MG IV SOLR: 40.0000 mg | Freq: Two times a day (BID) | INTRAVENOUS | Status: DC

## 2016-01-03 MED ORDER — ZOLPIDEM TARTRATE 5 MG PO TABS
5.0000 mg | ORAL_TABLET | Freq: Every evening | ORAL | Status: DC | PRN
  Administered 2016-01-03 – 2016-01-04 (×2): 5 mg via ORAL
  Filled 2016-01-03 (×2): qty 1

## 2016-01-03 MED ORDER — SODIUM CHLORIDE 0.9 % IV SOLN: 10.0000 mL/h | Freq: Once | INTRAVENOUS | Status: DC

## 2016-01-03 MED ORDER — VITAMIN B-1 100 MG PO TABS
100.0000 mg | ORAL_TABLET | Freq: Every day | ORAL | Status: DC
  Administered 2016-01-05: 100 mg via ORAL
  Filled 2016-01-03: qty 1

## 2016-01-03 MED ORDER — LORAZEPAM 2 MG/ML IJ SOLN
0.0000 mg | Freq: Four times a day (QID) | INTRAMUSCULAR | Status: DC
Start: 2016-01-03 — End: 2016-01-04

## 2016-01-03 MED ORDER — THIAMINE HCL 100 MG/ML IJ SOLN
100.0000 mg | Freq: Every day | INTRAMUSCULAR | Status: DC
  Administered 2016-01-03 – 2016-01-04 (×2): 100 mg via INTRAVENOUS
  Filled 2016-01-03: qty 2
  Filled 2016-01-03: qty 1
  Filled 2016-01-03: qty 2

## 2016-01-03 MED ORDER — METRONIDAZOLE 500 MG PO TABS
500.0000 mg | ORAL_TABLET | Freq: Three times a day (TID) | ORAL | Status: DC
  Administered 2016-01-03 – 2016-01-04 (×2): 500 mg via ORAL
  Filled 2016-01-03 (×4): qty 1

## 2016-01-03 MED ORDER — FENTANYL CITRATE (PF) 100 MCG/2ML IJ SOLN: 25.0000 ug | INTRAMUSCULAR | Status: DC | PRN

## 2016-01-03 MED ORDER — IOHEXOL 300 MG/ML  SOLN
100.0000 mL | Freq: Once | INTRAMUSCULAR | Status: AC | PRN
  Administered 2016-01-03: 100 mL via INTRAVENOUS

## 2016-01-03 MED ORDER — LORAZEPAM 1 MG PO TABS: 1.0000 mg | ORAL_TABLET | Freq: Four times a day (QID) | ORAL | Status: DC | PRN

## 2016-01-03 MED ORDER — NICOTINE 21 MG/24HR TD PT24
21.0000 mg | MEDICATED_PATCH | Freq: Every day | TRANSDERMAL | Status: DC
  Administered 2016-01-04 – 2016-01-05 (×2): 21 mg via TRANSDERMAL
  Filled 2016-01-03 (×3): qty 1

## 2016-01-03 MED ORDER — ADULT MULTIVITAMIN W/MINERALS CH
1.0000 | ORAL_TABLET | Freq: Every day | ORAL | Status: DC
  Administered 2016-01-04 – 2016-01-05 (×2): 1 via ORAL
  Filled 2016-01-03 (×3): qty 1

## 2016-01-03 MED ORDER — TRIAMCINOLONE ACETONIDE 0.1 % EX CREA
TOPICAL_CREAM | Freq: Two times a day (BID) | CUTANEOUS | Status: DC
  Administered 2016-01-03 – 2016-01-04 (×3): via TOPICAL
  Filled 2016-01-03 (×2): qty 15

## 2016-01-03 MED ORDER — ALBUTEROL SULFATE HFA 108 (90 BASE) MCG/ACT IN AERS: 2.0000 | INHALATION_SPRAY | Freq: Four times a day (QID) | RESPIRATORY_TRACT | Status: DC | PRN

## 2016-01-03 MED ORDER — PROPOFOL 10 MG/ML IV BOLUS
INTRAVENOUS | Status: DC | PRN
  Administered 2016-01-03: 40 mg via INTRAVENOUS

## 2016-01-03 MED ORDER — ALBUTEROL SULFATE (2.5 MG/3ML) 0.083% IN NEBU: 2.5000 mg | INHALATION_SOLUTION | Freq: Four times a day (QID) | RESPIRATORY_TRACT | Status: DC | PRN

## 2016-01-03 SURGICAL SUPPLY — 15 items

## 2016-01-03 NOTE — Transfer of Care (Signed)
Immediate Anesthesia Transfer of Care Note  Patient: [name]  Procedure(s) Performed: Procedure(s): ESOPHAGOGASTRODUODENOSCOPY (EGD) WITH PROPOFOL (Left)  Patient Location: PACU  Anesthesia Type:MAC  Level of Consciousness:  sedated, patient cooperative and responds to stimulation  Airway & Oxygen Therapy:Patient Spontanous Breathing and Patient connected to face mask oxgen  Post-op Assessment:  Report given to PACU RN and Post -op Vital signs reviewed and stable  Post vital signs:  Reviewed and stable  Last Vitals:  Filed Vitals:   01/03/16 0805 01/03/16 1055  BP: 115/94 126/69  Pulse: 86 97  Temp:  37.5 C  Resp: 19 12    Complications: No apparent anesthesia complications

## 2016-01-03 NOTE — Op Note (Signed)
[facility] [place], [address]   ENDOSCOPY PROCEDURE REPORT  PATIENT: Emily, Livingston  MR#: [id number] BIRTHDATE: 06/10/1946 , 56  yrs. old GENDER: female ENDOSCOPIST: Arta Silence, MD REFERRED BY:  [facility] PROCEDURE DATE:  2016-01-08 PROCEDURE:  EGD, diagnostic ASA CLASS:     Class II INDICATIONS:  hematemesis, melena. MEDICATIONS: Monitored anesthesia care TOPICAL ANESTHETIC: DESCRIPTION OF PROCEDURE: After the risks benefits and alternatives of the procedure were thoroughly explained, informed consent was obtained.  The Pentax Gastroscope [id number] endoscope was introduced through the mouth and advanced to the second portion of the duodenum. The instrument was slowly withdrawn as the mucosa was fully examined. Estimated blood loss is zero unless otherwise noted in this procedure report.     Findings:   No esophageal mass or varices noted.  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  Stomach had two large deep ulcers about 8-10 mm each in size; no active bleeding adherent clot or protuberant visible vessel was noted.  Couple shallow erosions in the distal duodenal bulb, otherwise normal duodenum to C-loop.    Retroflexion into cardia confirmed GE junction friability, otherwise normal.          The scope was then withdrawn from the patient and the procedure completed.  COMPLICATIONS: There were no immediate complications.  ENDOSCOPIC IMPRESSION:     As above.  Hematemesis likely from friable GE junction, may be from resolving small Mallory-Weiss tear; no focal ulcer or mass lesion noted and no overt esophagitis noted.  Large benign-appearing gastric ulcers could have contributed to her melena and hematemesis as well.  Suspect  NSAIDs contributing.  RECOMMENDATIONS:     1.  Watch for potential complications of procedure. 2.  PPI drip for 24-48 more hours. 3.  No NSAIDs or anticoagulants. 4.  Check H. pylori serologies. 5.  Sips ice chips for now; if no rebleeding, might consider clear liquids tonight. 6.  Repeat endoscopy if rampant persistent hematemesis, otherwise would monitor medically and follow clinical course. 7.  [facility] will follow. eSigned:  Arta Silence, MD 01-08-16 12:07 PM   CC:  CPT CODES: ICD CODES:  The ICD and CPT codes recommended by this software are interpretations from the data that the clinical staff has captured with the software.  The verification of the translation of this report to the ICD and CPT codes and modifiers is the sole responsibility of the health care institution and practicing physician where this report was generated.  [employer] will not be held responsible for the validity of the ICD and CPT codes included on this report.  AMA assumeno liability for data contained or not contained herein. CPT is a Designer, television/film set of the [employer].  PATIENT NAME:  Emily, Livingston MR#: [id number]

## 2016-01-03 NOTE — Telephone Encounter (Signed)
Please advise, thanks.

## 2016-01-03 NOTE — Interval H&P Note (Signed)
History and Physical Interval Note:  01/03/2016 11:17 AM  [name]  has presented today for surgery, with the diagnosis of hematemesis, melena, epigastric pain  The various methods of treatment have been discussed with the patient and family. After consideration of risks, benefits and other options for treatment, the patient has consented to  Procedure(s): ESOPHAGOGASTRODUODENOSCOPY (EGD) WITH PROPOFOL (Left) as a surgical intervention .  The patient's history has been reviewed, patient examined, no change in status, stable for surgery.  I have reviewed the patient's chart and labs.  Questions were answered to the patient's satisfaction.     [name]  Assessment:  1.  Hematemesis. 2.  Melena. 3.  Acute blood loss anemia.  Plan:  1.  Endoscopy. 2.  Risks (bleeding, infection, bowel perforation that could require surgery, sedation-related changes in cardiopulmonary systems), benefits (identification and possible treatment of source of symptoms, exclusion of certain causes of symptoms), and alternatives (watchful waiting, radiographic imaging studies, empiric medical treatment) of upper endoscopy (EGD) were explained to patient/family in detail and patient wishes to proceed.

## 2016-01-03 NOTE — H&P (Signed)
[facility] History and Physical  [name] [id number] DOB: 1946-02-28 DOA: 01/02/2016  Referring physician: ED physician PCP: [name], FNP  Specialists:   Chief Complaint: hematemesis, diarrhea and abdominal pain  HPI: [name] is a 70 y.o. female with PMH of hyperlipidemia, GERD, depression, asthma, Alcohol abuse, tobacco abuse, left renal cell cancer, psoriasis, who presents with hematemesis, diarrhea and abdominal pain.  Patient reports that she started having hematemesis since 3 PM yesterday. She had 4-5 times of hematemesis with dark colored blood. She has abdominal pain in periumbilical area. Her abdominal pain is constant, 9 out of 10 in severity, nonradiating. She also has diarrhea. She had more than 10 times of watery diarrhea with black colored stool yesterdy. Patient does not have chest pain, shortness breath, cough, symptoms for UTI or unilateral weakness. She she has generalized weakness.  In ED, patient was found to have blood pressure 91/56 which improved to 107/95 after IV fluid. Positive FOBT, hemoglobin dropped from 11.5 on 04/09/16--> 7.4, NR 1.22, lipase 17, temperature normal, tachycardia, electrolytes okay, abnormal liver function with ALP 255, AST 64, ALT 30, total bilirubin 1.8. Patient is admitted to inpatient for further evaluation treatment. PCCM was consulted, Dr. Lianne Cure did not think pt needs to be in ICU. GI, Dr. Michail Sermon was consulted, will see in AM.   #: colonoscopy on 09/25/13 by Dr. Michail Sermon showed colon polyps and internal hemorrhoid, s/p of snare cautery #: EGD on 09/25/13 by Dr. Michail Sermon showed a tiny antral ulcer, tiny hiatal hernia.  EKG: Independently reviewed. QTC 485, no ischemic change.  Where does patient live?   At home    Can patient participate in ADLs?   Some   Review of Systems:   General: no fevers, chills, no changes in body weight, has poor appetite, has fatigue HEENT: no blurry vision, hearing changes or sore  throat Pulm: no dyspnea, coughing, wheezing CV: no chest pain, no palpitations Abd: has nausea, abdominal pain, diarrhea, and hematemesis GU: no dysuria, burning on urination, increased urinary frequency, hematuria  Ext: no leg edema Neuro: no unilateral weakness, numbness, or tingling, no vision change or hearing loss Skin: no rash MSK: No muscle spasm, no deformity, no limitation of range of movement in spin Heme: No easy bruising.  Travel history: No recent long distant travel.  Allergy:  Allergies  Allergen Reactions  . Azithromycin Itching and Swelling    Past Medical History  Diagnosis Date  . Seasonal allergies   . Hypercholesteremia     under control  . GERD (gastroesophageal reflux disease)     occasional  . Arthritis     back-severe, hips, right knee  . H/O mumps   . H/O measles   . Insomnia   . Asthma   . Allergy   . Renal cell carcinoma 2012    left  . Psoriasis (a type of skin inflammation)   . Alcohol abuse   . Alcohol abuse     Past Surgical History  Procedure Laterality Date  . Kidney surgery  12/2010    [facility]; partial nephrectomy  . Hernia repair  [id number]    supraumbilical repair  . Bunionectomy  04/2011  . Menisectomy  2010    left knee  . Cholecystectomy  11/13/2011    Procedure: LAPAROSCOPIC CHOLECYSTECTOMY WITH INTRAOPERATIVE CHOLANGIOGRAM;  Surgeon: Judieth Keens, DO;  Location: [facility]S;  Service: General;  Laterality: N/A;  . Esophagogastroduodenoscopy N/A 09/25/2013    Procedure: ESOPHAGOGASTRODUODENOSCOPY (EGD);  Surgeon: Loanne Drilling.  Michail Sermon, MD;  Location: [facility];  Service: Endoscopy;  Laterality: N/A;  . Colonoscopy N/A 09/25/2013    Procedure: COLONOSCOPY;  Surgeon: Lear Ng, MD;  Location: [facility] END[facility];  Service: Endoscopy;  Laterality: N/A;  . Abdominal hysterectomy  40years ago  . Tubal ligation  44 years ago  . Lumbar laminectomy/decompression microdiscectomy Right 10/12/2014    Procedure: HEMI LAMINECTOMY  MICRODISCECTOMY L5-S1 RIGHT (1 LEVEL);  Surgeon: Tobi Bastos, MD;  Location: [facility];  Service: Orthopedics;  Laterality: Right;    Social History:  reports that she has been smoking Cigarettes.  She has a .9 pack-year smoking history. She has never used smokeless tobacco. She reports that she drinks alcohol. She reports that she does not use illicit drugs.  Family History:  Family History  Problem Relation Age of Onset  . Hyperlipidemia Mother   . Hypertension Mother      Prior to Admission medications   Medication Sig Start Date End Date Taking? Authorizing Provider  albuterol (PROVENTIL HFA;VENTOLIN HFA) 108 (90 BASE) MCG/ACT inhaler Inhale 2 puffs into the lungs every 6 (six) hours as needed for wheezing or shortness of breath.    Yes Historical Provider, MD  [employer] STOOL SOFTENER 100 MG capsule TAKE 1-2 CAPSULES BY MOUTH DAILY AS NEEDED FOR MILD CONSTIPATION. 09/20/15  Yes Golden Circle, FNP  DULoxetine (CYMBALTA) 60 MG capsule Take 1 capsule (60 mg total) by mouth daily. 10/25/15  Yes Golden Circle, FNP  famotidine (PEPCID) 20 MG tablet Take 1 tablet (20 mg total) by mouth 2 (two) times daily. 10/25/15  Yes Golden Circle, FNP  Fluticasone-Salmeterol (ADVAIR) 500-50 MCG/DOSE AEPB Inhale 1 puff into the lungs 2 (two) times daily.   Yes Historical Provider, MD  Magnesium Oxide 400 (240 MG) MG TABS Take 1 tablet by mouth daily. 04/02/15  Yes Historical Provider, MD  meloxicam (MOBIC) 15 MG tablet Take 1 tablet (15 mg total) by mouth daily. 10/25/15  Yes Golden Circle, FNP  montelukast (SINGULAIR) 10 MG tablet Take 1 tablet (10 mg total) by mouth at bedtime. 10/25/15  Yes Golden Circle, FNP  PARoxetine (PAXIL) 10 MG tablet Take 0.5 tablets (5 mg total) by mouth daily. 10/25/15  Yes Golden Circle, FNP  potassium chloride SA (KLOR-CON M20) 20 MEQ tablet Take 1 tablet (20 mEq total) by mouth 2 (two) times daily with a meal. 10/25/15  Yes Golden Circle, FNP  simvastatin  (ZOCOR) 40 MG tablet Take 1 tablet (40 mg total) by mouth every morning. 10/25/15  Yes Golden Circle, FNP  triamcinolone cream (KENALOG) 0.1 % APPLY TO AFFECTED AREA TWICE A DAY 09/20/15  Yes Golden Circle, FNP  zolpidem (AMBIEN) 5 MG tablet TAKE 1 TABLET BY MOUTH EVERY DAY AT BEDTIME AS NEEDED FOR SLEEP 10/25/15  Yes Golden Circle, FNP  HYDROcodone-acetaminophen (NORCO/VICODIN) 5-325 MG tablet Take 1 tablet by mouth every 6 (six) hours as needed for moderate pain. Patient not taking: Reported on 01/03/2016 10/25/15   Golden Circle, FNP  methocarbamol (ROBAXIN) 750 MG tablet TAKE 1 TABLET BY MOUTH EVERY 8 HOURS AS NEEDED FOR MUSCLE SPASM Patient not taking: Reported on 01/03/2016 10/25/15   Golden Circle, FNP  predniSONE (DELTASONE) 10 MG tablet Take 6 tablets x 1 day, 5 tablets x 1 day, 4 tablets x 1 day, 3 tablets x 1 day, 2 tablets x 1 day, 1 tablet x 1 day Patient not taking: Reported on 01/03/2016 07/24/15   Belenda Cruise  Tiffany Kocher, FNP    Physical Exam: Filed Vitals:   01/03/16 0121 01/03/16 0149 01/03/16 0150 01/03/16 0300  BP:  120/64 125/73 142/61  Pulse:  105 106 101  Temp: 97.8 F (36.6 C) 97.7 F (36.5 C)    TempSrc: Axillary Axillary    Resp:  17 17   Height:      Weight:      SpO2:  100% 100%    General: Not in acute distress HEENT:       Eyes: PERRL, EOMI, no scleral icterus.       ENT: No discharge from the ears and nose, no pharynx injection, no tonsillar enlargement.        Neck: No JVD, no bruit, no mass felt. Heme: No neck lymph node enlargement. Cardiac: S1/S2, RRR, No murmurs, No gallops or rubs. Pulm:  No rales, wheezing, rhonchi or rubs. Abd: Soft, nondistended, tenderness over periumbilical area, no rebound pain, no organomegaly, BS present. Ext: No pitting leg edema bilaterally. 2+DP/PT pulse bilaterally. Musculoskeletal: No joint deformities, No joint redness or warmth, no limitation of ROM in spin. Skin: No rashes.  Neuro: Alert, oriented X3,  cranial nerves II-XII grossly intact, moves all extremities normally.  Psych: Patient is not psychotic, no suicidal or hemocidal ideation.  Labs on Admission:  Basic Metabolic Panel:  Recent Labs Lab 01/03/16 0020 01/03/16 0030  NA 139 139  K 4.2 4.4  CL 103 102  CO2 21*  --   GLUCOSE 160* 150*  BUN 38* 37*  CREATININE 1.07* 1.20*  CALCIUM 9.2  --    Liver Function Tests:  Recent Labs Lab 01/03/16 0020  AST 64*  ALT 30  ALKPHOS 255*  BILITOT 1.8*  PROT 6.7  ALBUMIN 3.3*    Recent Labs Lab 01/03/16 0020  LIPASE 17   No results for input(s): AMMONIA in the last 168 hours. CBC:  Recent Labs Lab 01/03/16 0020 01/03/16 0030  WBC 9.6  --   NEUTROABS 7.0  --   HGB 7.4* 9.5*  HCT 23.2* 28.0*  MCV 100.0  --   PLT 208  --    Cardiac Enzymes: No results for input(s): CKTOTAL, CKMB, CKMBINDEX, TROPONINI in the last 168 hours.  BNP (last 3 results) No results for input(s): BNP in the last 8760 hours.  ProBNP (last 3 results) No results for input(s): PROBNP in the last 8760 hours.  CBG: No results for input(s): GLUCAP in the last 168 hours.  Radiological Exams on Admission: No results found.  Assessment/Plan Principal Problem:   Hematemesis Active Problems:   Hyperlipidemia   Asthma   Arthritis   Psoriasis   Alcohol abuse   Tobacco abuse   GIB (gastrointestinal bleeding)   Diarrhea   Depression   Hematemesis: Colonoscopy on 09/25/13 showed polyps and internal hemorrhoid. EGD on 09/25/13 showed a tiny antral ulcer. Her hematemesis is likely from a upper GI bleeding. 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 She is taking mobic which may have also contributed. GI, Dr. Michail Sermon was consulted. Hemoglobin dropped from 11.5--> 7.4. Initial blood pressure was soft, which responded to IV fluids. Currently hemodynamically stable.  - will admit to [facility] - 2 units of blood were ordered by EDP - GI consulted by Ed, will follow up recommendations - NPO - IVF: 2L NS  and 75 mL/hr - ED started pantoprazole gtt and Octreotide, will contine - Zofran IV for nausea - hold Mobic - Maintain IV access (2 large bore IVs if possible). - Monitor closely  and follow q6h cbc, transfuse as necessary. - LaB: INR, PTT  Abnormal liver function: Likely due to alcohol abuse, but will need to r/o hepatitis. -Check direct bilirubin, hepatitis panel  Tobacco abuse and Alcohol abuse: -Did counseling about importance of quitting smoking -Nicotine patch -Did counseling about the importance of quitting drinking -CIWA protocol  Asthma: Stable. Ruthe Mannan inhaler -prn Albuterol Nebs  HLD: Last LDL was 110 on 12/04/10 -Continue home medications: Zocor -Check FLP  Diarrhea: etiology is not clear. Upper GI bleeding may have partially contributed, cannot completely rule out other possibilities, such as C. Difficile colitis. -check C. Difficile PCR and GI pathogen panel -ct-abd/pelvis for AP.  Depression and anxiety: Stable, no suicidal or homicidal ideations. -Continue home medications: Cymbalta, Paxil  DVT ppx: SCD  Code Status: Full code Family Communication: None at bed side.       Disposition Plan: Admit to inpatient   Date of Service 01/03/2016    Ivor Costa [facility] Pager [phone]  If 7PM-7AM, please contact night-coverage [email or web address] Password [facility] 01/03/2016, 3:23 AM

## 2016-01-03 NOTE — ED Notes (Signed)
Call made to GI on call by QA @ 0120

## 2016-01-03 NOTE — Anesthesia Postprocedure Evaluation (Signed)
Anesthesia Post Note  Patient: [name]  Procedure(s) Performed: Procedure(s) (LRB): ESOPHAGOGASTRODUODENOSCOPY (EGD) WITH PROPOFOL (Left)  Patient location during evaluation: PACU Anesthesia Type: MAC Level of consciousness: awake and alert Pain management: pain level controlled Vital Signs Assessment: post-procedure vital signs reviewed and stable Respiratory status: spontaneous breathing, nonlabored ventilation, respiratory function stable and patient connected to nasal cannula oxygen Cardiovascular status: stable and blood pressure returned to baseline Anesthetic complications: no    Last Vitals:  Filed Vitals:   01/03/16 1200 01/03/16 1210  BP: 138/70 136/70  Pulse: 86 90  Temp:    Resp: 14 12    Last Pain:  Filed Vitals:   01/03/16 1212  PainSc: 7                  Mack Thurmon EDWARD

## 2016-01-03 NOTE — Care Management Note (Signed)
Case Management Note  Patient Details  Name: [name] MRN: [id number] Date of Birth: 02-28-1946  Subjective/Objective:                Gi bleed with anemia    Action/Plan:Date: January 03, 2016 Chart reviewed for concurrent status and case management needs. Will continue to follow patient for changes and needs: Velva Harman, BSN, RN, [place]   [phone]   Expected Discharge Date:                  Expected Discharge Plan:     In-House Referral:     Discharge planning Services     Post Acute Care Choice:    Choice offered to:     DME Arranged:    DME Agency:     HH Arranged:    [place] Agency:     Status of Service:     Medicare Important Message Given:    Date Medicare IM Given:    Medicare IM give by:    Date Additional Medicare IM Given:    Additional Medicare Important Message give by:     If discussed at [place] of Stay Meetings, dates discussed:    Additional Comments:  Leeroy Cha, RN 01/03/2016, 11:22 AM

## 2016-01-03 NOTE — Consult Note (Signed)
[facility] Gastroenterology Consultation Note  Referring Provider: Dr. Ivor Costa [facility]) Primary Care Physician:  [name], [place] Primary Gastroenterologist:  Dr. Wilford Corner  Reason for Consultation:  hematemesis  HPI: [name] is a 70 y.o. female whom we've been asked to see for hematemesis.  Yesterday, patient had acute onset of hematemesis, several episodes of frankly bloody emesis.  None since admission.  Some epigastric pain and melena.  Uses NSAIDs occasionally.  Had endoscopy and colonoscopy in November 2014, which showed antral ulcer.  No weight loss. Feels much better now.  Has been anemic, received blood transfusion and PPI overnight.   Past Medical History  Diagnosis Date  . Seasonal allergies   . Hypercholesteremia     under control  . GERD (gastroesophageal reflux disease)     occasional  . Arthritis     back-severe, hips, right knee  . H/O mumps   . H/O measles   . Insomnia   . Asthma   . Allergy   . Renal cell carcinoma 2012    left  . Psoriasis (a type of skin inflammation)   . Alcohol abuse   . Alcohol abuse     Past Surgical History  Procedure Laterality Date  . Kidney surgery  12/2010    [facility]; partial nephrectomy  . Hernia repair  [id number]    supraumbilical repair  . Bunionectomy  04/2011  . Menisectomy  2010    left knee  . Cholecystectomy  11/13/2011    Procedure: LAPAROSCOPIC CHOLECYSTECTOMY WITH INTRAOPERATIVE CHOLANGIOGRAM;  Surgeon: Judieth Keens, DO;  Location: [facility];  Service: General;  Laterality: N/A;  . Esophagogastroduodenoscopy N/A 09/25/2013    Procedure: ESOPHAGOGASTRODUODENOSCOPY (EGD);  Surgeon: Lear Ng, MD;  Location: [facility];  Service: Endoscopy;  Laterality: N/A;  . Colonoscopy N/A 09/25/2013    Procedure: COLONOSCOPY;  Surgeon: Lear Ng, MD;  Location: [facility];  Service: Endoscopy;  Laterality: N/A;  . Abdominal hysterectomy  40years ago  . Tubal ligation  44 years ago  . Lumbar  laminectomy/decompression microdiscectomy Right 10/12/2014    Procedure: HEMI LAMINECTOMY MICRODISCECTOMY L5-S1 RIGHT (1 LEVEL);  Surgeon: Tobi Bastos, MD;  Location: [facility];  Service: Orthopedics;  Laterality: Right;    Prior to Admission medications   Medication Sig Start Date End Date Taking? Authorizing Provider  albuterol (PROVENTIL HFA;VENTOLIN HFA) 108 (90 BASE) MCG/ACT inhaler Inhale 2 puffs into the lungs every 6 (six) hours as needed for wheezing or shortness of breath.    Yes Historical Provider, MD  [employer] STOOL SOFTENER 100 MG capsule TAKE 1-2 CAPSULES BY MOUTH DAILY AS NEEDED FOR MILD CONSTIPATION. 09/20/15  Yes Golden Circle, FNP  DULoxetine (CYMBALTA) 60 MG capsule Take 1 capsule (60 mg total) by mouth daily. 10/25/15  Yes Golden Circle, FNP  famotidine (PEPCID) 20 MG tablet Take 1 tablet (20 mg total) by mouth 2 (two) times daily. 10/25/15  Yes Golden Circle, FNP  Fluticasone-Salmeterol (ADVAIR) 500-50 MCG/DOSE AEPB Inhale 1 puff into the lungs 2 (two) times daily.   Yes Historical Provider, MD  Magnesium Oxide 400 (240 MG) MG TABS Take 1 tablet by mouth daily. 04/02/15  Yes Historical Provider, MD  meloxicam (MOBIC) 15 MG tablet Take 1 tablet (15 mg total) by mouth daily. 10/25/15  Yes Golden Circle, FNP  montelukast (SINGULAIR) 10 MG tablet Take 1 tablet (10 mg total) by mouth at bedtime. 10/25/15  Yes Golden Circle, FNP  PARoxetine (PAXIL) 10 MG tablet  Take 0.5 tablets (5 mg total) by mouth daily. 10/25/15  Yes Golden Circle, FNP  potassium chloride SA (KLOR-CON M20) 20 MEQ tablet Take 1 tablet (20 mEq total) by mouth 2 (two) times daily with a meal. 10/25/15  Yes Golden Circle, FNP  simvastatin (ZOCOR) 40 MG tablet Take 1 tablet (40 mg total) by mouth every morning. 10/25/15  Yes Golden Circle, FNP  triamcinolone cream (KENALOG) 0.1 % APPLY TO AFFECTED AREA TWICE A DAY 09/20/15  Yes Golden Circle, FNP  zolpidem (AMBIEN) 5 MG tablet TAKE 1 TABLET BY  MOUTH EVERY DAY AT BEDTIME AS NEEDED FOR SLEEP 10/25/15  Yes Golden Circle, FNP  HYDROcodone-acetaminophen (NORCO/VICODIN) 5-325 MG tablet Take 1 tablet by mouth every 6 (six) hours as needed for moderate pain. Patient not taking: Reported on 01/03/2016 10/25/15   Golden Circle, FNP  methocarbamol (ROBAXIN) 750 MG tablet TAKE 1 TABLET BY MOUTH EVERY 8 HOURS AS NEEDED FOR MUSCLE SPASM Patient not taking: Reported on 01/03/2016 10/25/15   Golden Circle, FNP  predniSONE (DELTASONE) 10 MG tablet Take 6 tablets x 1 day, 5 tablets x 1 day, 4 tablets x 1 day, 3 tablets x 1 day, 2 tablets x 1 day, 1 tablet x 1 day Patient not taking: Reported on 01/03/2016 07/24/15   Golden Circle, FNP    Current Facility-Administered Medications  Medication Dose Route Frequency Provider Last Rate Last Dose  . 0.9 %  sodium chloride infusion  10 mL/hr Intravenous Once Quintella Reichert, MD      . 0.9 %  sodium chloride infusion   Intravenous Continuous Ivor Costa, MD 75 mL/hr at 01/03/16 0300    . albuterol (PROVENTIL) (2.5 MG/3ML) 0.083% nebulizer solution 2.5 mg  2.5 mg Nebulization Q6H PRN Ivor Costa, MD      . DULoxetine (CYMBALTA) DR capsule 60 mg  60 mg Oral Daily Ivor Costa, MD      . folic acid (FOLVITE) tablet 1 mg  1 mg Oral Daily Ivor Costa, MD      . LORazepam (ATIVAN) injection 0-4 mg  0-4 mg Intravenous Q6H Ivor Costa, MD   0 mg at 01/03/16 0245   Followed by  . [START ON 01/05/2016] LORazepam (ATIVAN) injection 0-4 mg  0-4 mg Intravenous Q12H Ivor Costa, MD      . LORazepam (ATIVAN) tablet 1 mg  1 mg Oral Q6H PRN Ivor Costa, MD       Or  . LORazepam (ATIVAN) injection 1 mg  1 mg Intravenous Q6H PRN Ivor Costa, MD      . magnesium oxide (MAG-OX) tablet 400 mg  400 mg Oral Daily Ivor Costa, MD      . mometasone-formoterol [facility]) 200-5 MCG/ACT inhaler 2 puff  2 puff Inhalation BID Ivor Costa, MD      . morphine 2 MG/ML injection 2 mg  2 mg Intravenous Q4H PRN Ivor Costa, MD   2 mg at 01/03/16 0805  .  multivitamin with minerals tablet 1 tablet  1 tablet Oral Daily Ivor Costa, MD      . nicotine (NICODERM CQ - dosed in mg/24 hours) patch 21 mg  21 mg Transdermal Daily Ivor Costa, MD      . octreotide (SANDOSTATIN) 500 mcg in sodium chloride 0.9 % 250 mL (2 mcg/mL) infusion  50 mcg/hr Intravenous Continuous Quintella Reichert, MD 25 mL/hr at 01/03/16 0430 50 mcg/hr at 01/03/16 0430  . ondansetron (ZOFRAN) injection 4 mg  4 mg  Intravenous Q8H PRN Ivor Costa, MD      . pantoprazole (PROTONIX) 80 mg in sodium chloride 0.9 % 250 mL (0.32 mg/mL) infusion  8 mg/hr Intravenous Continuous Quintella Reichert, MD 25 mL/hr at 01/03/16 0430 8 mg/hr at 01/03/16 0430  . PARoxetine (PAXIL) tablet 5 mg  5 mg Oral Daily Ivor Costa, MD      . simvastatin (ZOCOR) tablet 40 mg  40 mg Oral q1800 Ivor Costa, MD      . sodium chloride flush (NS) 0.9 % injection 3 mL  3 mL Intravenous Q12H Ivor Costa, MD   3 mL at 01/03/16 0701  . thiamine (VITAMIN B-1) tablet 100 mg  100 mg Oral Daily Ivor Costa, MD       Or  . thiamine (B-1) injection 100 mg  100 mg Intravenous Daily Ivor Costa, MD      . triamcinolone cream (KENALOG) 0.1 %   Topical BID Ivor Costa, MD      . zolpidem [facility]) tablet 5 mg  5 mg Oral QHS PRN Ivor Costa, MD        Allergies as of 01/02/2016 - Review Complete 01/02/2016  Allergen Reaction Noted  . Azithromycin Itching and Swelling 10/17/2006    Family History  Problem Relation Age of Onset  . Hyperlipidemia Mother   . Hypertension Mother     Social History   Social History  . Marital Status: Divorced    Spouse Name: N/A  . Number of Children: 3  . Years of Education: 12   Occupational History  . Retired    Social History Main Topics  . Smoking status: Current Some Day Smoker -- 0.15 packs/day for 6 years    Types: Cigarettes  . Smokeless tobacco: Never Used  . Alcohol Use: 0.0 oz/week    0 Standard drinks or equivalent per week     Comment: socially  . Drug Use: No  . Sexual Activity: Yes    Other Topics Concern  . Not on file   Social History Narrative   Fun: play cards, play with her grandchildren, cooking   Denies religious beliefs effecting health care.     Review of Systems: ROS 01/03/16 Dr. Blaine Hamper reviewed and I agree Physical Exam: Vital signs in last 24 hours: Temp:  [97.7 F (36.5 C)-98.9 F (37.2 C)] 97.8 F (36.6 C) (02/22 0739) Pulse Rate:  [77-118] 86 (02/22 0805) Resp:  [13-21] 19 (02/22 0805) BP: (91-167)/(56-95) 115/94 mmHg (02/22 0805) SpO2:  [97 %-100 %] 100 % (02/22 0805) Weight:  [65.772 kg (145 lb)] 65.772 kg (145 lb) (02/21 2319) Last BM Date: 01/02/16 General:   Alert,  Thin but not cachectic-appearing, older-appearing than stated age, NAD Head:  Normocephalic and atraumatic. Eyes:  Sclera clear, no icterus.   Conjunctiva pink. Ears:  Normal auditory acuity. Nose:  No deformity, discharge,  or lesions. Mouth:  No deformity or lesions.  Oropharynx pink & moist. Neck:  Supple; no masses or thyromegaly. Lungs:  Clear throughout to auscultation.   No wheezes, crackles, or rhonchi. No acute distress. Heart:  Regular rate and rhythm; no murmurs, clicks, rubs,  or gallops. Abdomen:  Soft, mild epigastric tenderness without peritonitis, scattered normal-sounding bowel sounds. No masses, hepatosplenomegaly or hernias noted. Normal bowel sounds, without guarding, and without rebound.     Msk:  Symmetrical without gross deformities. Normal posture. Pulses:  Normal pulses noted. Extremities:  Without clubbing or edema. Neurologic:  Alert and  oriented x4; diffusely weak, otherwise grossly normal  neurologically. Skin:  Intact without significant lesions or rashes. Psych:  Alert and cooperative. Normal mood and affect.  Lab Results:  Recent Labs  01/03/16 0020 01/03/16 0030  WBC 9.6  --   HGB 7.4* 9.5*  HCT 23.2* 28.0*  PLT 208  --    BMET  Recent Labs  01/03/16 0020 01/03/16 0030  NA 139 139  K 4.2 4.4  CL 103 102  CO2 21*  --    GLUCOSE 160* 150*  BUN 38* 37*  CREATININE 1.07* 1.20*  CALCIUM 9.2  --    LFT  Recent Labs  01/03/16 0020  PROT 6.7  ALBUMIN 3.3*  AST 64*  ALT 30  ALKPHOS 255*  BILITOT 1.8*   PT/INR  Recent Labs  01/03/16 0020  LABPROT 15.1  INR 1.22    Studies/Results: No results found.  Impression:  1.  Hematemesis.  Resolved. 2.  Epigastric abdominal pain. 3.  Acute blood loss anemia. 4.  Alcohol abuse. 5.  Elevated LFTs, mild.  Likely alcohol-mediated.  Plan:  1.  NPO. 2.  PPI. 3.  Serial CBCs. 4.  IV fluids, transfusion as needed. 5.  Endoscopy today for further evaluation. 6.  Risks (bleeding, infection, bowel perforation that could require surgery, sedation-related changes in cardiopulmonary systems), benefits (identification and possible treatment of source of symptoms, exclusion of certain causes of symptoms), and alternatives (watchful waiting, radiographic imaging studies, empiric medical treatment) of upper endoscopy (EGD) were explained to patient/family in detail and patient wishes to proceed.   LOS: 0 days   [name] M  01/03/2016, 8:19 AM  Pager [phone] If no answer or after 5 PM call [phone]

## 2016-01-03 NOTE — Progress Notes (Signed)
Patient seen and examined  70 y.o. female with PMH of hyperlipidemia, GERD, depression, asthma, Alcohol abuse, tobacco abuse, left renal cell cancer, psoriasis, who presents with hematemesis, diarrhea and abdominal pain. Complaining of epigastric pain and melena. Presented with a hemoglobin of 7.4. Status post transfusion of 2 units. Hemoglobin now 9.0. CT abdomen pelvis shows fatty infiltration of the liver. Thickening of the distal gastric/pyloric wall. Mild distal gastritis/ulcer not excluded. patient scheduled to undergo EGD this morning Continue Protonix/octreotide drip pending further recommendations from GI Hold NSAIDs Follow function closely, no evidence of pancreatitis on CT Diarrhea improving, follow GI pathogen panel, C. difficile pending

## 2016-01-03 NOTE — Anesthesia Preprocedure Evaluation (Signed)
Anesthesia Evaluation  Patient identified by MRN, date of birth, ID band Patient awake    Reviewed: Allergy & Precautions, H&P , NPO status , Patient's Chart, lab work & pertinent test results, reviewed documented beta blocker date and time   Airway Mallampati: II  TM Distance: >3 FB Neck ROM: Full    Dental no notable dental hx. (+) Partial Upper,    Pulmonary neg shortness of breath, asthma , neg sleep apnea, neg recent URI, Current Smoker,    Pulmonary exam normal breath sounds clear to auscultation       Cardiovascular negative cardio ROS   Rhythm:Regular Rate:Normal     Neuro/Psych neg Seizures Low back pain with right leg symptoms  Neuromuscular disease negative psych ROS   GI/Hepatic Neg liver ROS, GERD  Medicated and Controlled,  Endo/Other  negative endocrine ROS  Renal/GU negative Renal ROS     Musculoskeletal  (+) Arthritis , Osteoarthritis,    Abdominal   Peds  Hematology  (+) anemia ,   Anesthesia Other Findings   Reproductive/Obstetrics                             Anesthesia Physical  Anesthesia Plan  ASA: II  Anesthesia Plan: MAC   Post-op Pain Management:    Induction: Intravenous  Airway Management Planned: Mask and Natural Airway  Additional Equipment: None  Intra-op Plan:   Post-operative Plan:   Informed Consent: I have reviewed the patients History and Physical, chart, labs and discussed the procedure including the risks, benefits and alternatives for the proposed anesthesia with the patient or authorized representative who has indicated his/her understanding and acceptance.   Dental advisory given  Plan Discussed with: CRNA and Surgeon  Anesthesia Plan Comments:         Anesthesia Quick Evaluation

## 2016-01-03 NOTE — H&P (View-Only) (Signed)
[facility] Gastroenterology Consultation Note  Referring Provider: Dr. Ivor Costa [facility]) Primary Care Physician:  [name], [place] Primary Gastroenterologist:  Dr. Wilford Corner  Reason for Consultation:  hematemesis  HPI: [name] is a 70 y.o. female whom we've been asked to see for hematemesis.  Yesterday, patient had acute onset of hematemesis, several episodes of frankly bloody emesis.  None since admission.  Some epigastric pain and melena.  Uses NSAIDs occasionally.  Had endoscopy and colonoscopy in November 2014, which showed antral ulcer.  No weight loss. Feels much better now.  Has been anemic, received blood transfusion and PPI overnight.   Past Medical History  Diagnosis Date  . Seasonal allergies   . Hypercholesteremia     under control  . GERD (gastroesophageal reflux disease)     occasional  . Arthritis     back-severe, hips, right knee  . H/O mumps   . H/O measles   . Insomnia   . Asthma   . Allergy   . Renal cell carcinoma 2012    left  . Psoriasis (a type of skin inflammation)   . Alcohol abuse   . Alcohol abuse     Past Surgical History  Procedure Laterality Date  . Kidney surgery  12/2010    [facility]; partial nephrectomy  . Hernia repair  [id number]    supraumbilical repair  . Bunionectomy  04/2011  . Menisectomy  2010    left knee  . Cholecystectomy  11/13/2011    Procedure: LAPAROSCOPIC CHOLECYSTECTOMY WITH INTRAOPERATIVE CHOLANGIOGRAM;  Surgeon: Judieth Keens, DO;  Location: [facility]S;  Service: General;  Laterality: N/A;  . Esophagogastroduodenoscopy N/A 09/25/2013    Procedure: ESOPHAGOGASTRODUODENOSCOPY (EGD);  Surgeon: Lear Ng, MD;  Location: [facility];  Service: Endoscopy;  Laterality: N/A;  . Colonoscopy N/A 09/25/2013    Procedure: COLONOSCOPY;  Surgeon: Lear Ng, MD;  Location: [facility];  Service: Endoscopy;  Laterality: N/A;  . Abdominal hysterectomy  40years ago  . Tubal ligation  44 years ago  . Lumbar  laminectomy/decompression microdiscectomy Right 10/12/2014    Procedure: HEMI LAMINECTOMY MICRODISCECTOMY L5-S1 RIGHT (1 LEVEL);  Surgeon: Tobi Bastos, MD;  Location: [facility];  Service: Orthopedics;  Laterality: Right;    Prior to Admission medications   Medication Sig Start Date End Date Taking? Authorizing Provider  albuterol (PROVENTIL HFA;VENTOLIN HFA) 108 (90 BASE) MCG/ACT inhaler Inhale 2 puffs into the lungs every 6 (six) hours as needed for wheezing or shortness of breath.    Yes Historical Provider, MD  [employer] STOOL SOFTENER 100 MG capsule TAKE 1-2 CAPSULES BY MOUTH DAILY AS NEEDED FOR MILD CONSTIPATION. 09/20/15  Yes Golden Circle, FNP  DULoxetine (CYMBALTA) 60 MG capsule Take 1 capsule (60 mg total) by mouth daily. 10/25/15  Yes Golden Circle, FNP  famotidine (PEPCID) 20 MG tablet Take 1 tablet (20 mg total) by mouth 2 (two) times daily. 10/25/15  Yes Golden Circle, FNP  Fluticasone-Salmeterol (ADVAIR) 500-50 MCG/DOSE AEPB Inhale 1 puff into the lungs 2 (two) times daily.   Yes Historical Provider, MD  Magnesium Oxide 400 (240 MG) MG TABS Take 1 tablet by mouth daily. 04/02/15  Yes Historical Provider, MD  meloxicam (MOBIC) 15 MG tablet Take 1 tablet (15 mg total) by mouth daily. 10/25/15  Yes Golden Circle, FNP  montelukast (SINGULAIR) 10 MG tablet Take 1 tablet (10 mg total) by mouth at bedtime. 10/25/15  Yes Golden Circle, FNP  PARoxetine (PAXIL) 10 MG tablet  Take 0.5 tablets (5 mg total) by mouth daily. 10/25/15  Yes Golden Circle, FNP  potassium chloride SA (KLOR-CON M20) 20 MEQ tablet Take 1 tablet (20 mEq total) by mouth 2 (two) times daily with a meal. 10/25/15  Yes Golden Circle, FNP  simvastatin (ZOCOR) 40 MG tablet Take 1 tablet (40 mg total) by mouth every morning. 10/25/15  Yes Golden Circle, FNP  triamcinolone cream (KENALOG) 0.1 % APPLY TO AFFECTED AREA TWICE A DAY 09/20/15  Yes Golden Circle, FNP  zolpidem (AMBIEN) 5 MG tablet TAKE 1 TABLET BY  MOUTH EVERY DAY AT BEDTIME AS NEEDED FOR SLEEP 10/25/15  Yes Golden Circle, FNP  HYDROcodone-acetaminophen (NORCO/VICODIN) 5-325 MG tablet Take 1 tablet by mouth every 6 (six) hours as needed for moderate pain. Patient not taking: Reported on 01/03/2016 10/25/15   Golden Circle, FNP  methocarbamol (ROBAXIN) 750 MG tablet TAKE 1 TABLET BY MOUTH EVERY 8 HOURS AS NEEDED FOR MUSCLE SPASM Patient not taking: Reported on 01/03/2016 10/25/15   Golden Circle, FNP  predniSONE (DELTASONE) 10 MG tablet Take 6 tablets x 1 day, 5 tablets x 1 day, 4 tablets x 1 day, 3 tablets x 1 day, 2 tablets x 1 day, 1 tablet x 1 day Patient not taking: Reported on 01/03/2016 07/24/15   Golden Circle, FNP    Current Facility-Administered Medications  Medication Dose Route Frequency Provider Last Rate Last Dose  . 0.9 %  sodium chloride infusion  10 mL/hr Intravenous Once Quintella Reichert, MD      . 0.9 %  sodium chloride infusion   Intravenous Continuous Ivor Costa, MD 75 mL/hr at 01/03/16 0300    . albuterol (PROVENTIL) (2.5 MG/3ML) 0.083% nebulizer solution 2.5 mg  2.5 mg Nebulization Q6H PRN Ivor Costa, MD      . DULoxetine (CYMBALTA) DR capsule 60 mg  60 mg Oral Daily Ivor Costa, MD      . folic acid (FOLVITE) tablet 1 mg  1 mg Oral Daily Ivor Costa, MD      . LORazepam (ATIVAN) injection 0-4 mg  0-4 mg Intravenous Q6H Ivor Costa, MD   0 mg at 01/03/16 0245   Followed by  . [START ON 01/05/2016] LORazepam (ATIVAN) injection 0-4 mg  0-4 mg Intravenous Q12H Ivor Costa, MD      . LORazepam (ATIVAN) tablet 1 mg  1 mg Oral Q6H PRN Ivor Costa, MD       Or  . LORazepam (ATIVAN) injection 1 mg  1 mg Intravenous Q6H PRN Ivor Costa, MD      . magnesium oxide (MAG-OX) tablet 400 mg  400 mg Oral Daily Ivor Costa, MD      . mometasone-formoterol [facility]) 200-5 MCG/ACT inhaler 2 puff  2 puff Inhalation BID Ivor Costa, MD      . morphine 2 MG/ML injection 2 mg  2 mg Intravenous Q4H PRN Ivor Costa, MD   2 mg at 01/03/16 0805  .  multivitamin with minerals tablet 1 tablet  1 tablet Oral Daily Ivor Costa, MD      . nicotine (NICODERM CQ - dosed in mg/24 hours) patch 21 mg  21 mg Transdermal Daily Ivor Costa, MD      . octreotide (SANDOSTATIN) 500 mcg in sodium chloride 0.9 % 250 mL (2 mcg/mL) infusion  50 mcg/hr Intravenous Continuous Quintella Reichert, MD 25 mL/hr at 01/03/16 0430 50 mcg/hr at 01/03/16 0430  . ondansetron (ZOFRAN) injection 4 mg  4 mg  Intravenous Q8H PRN Ivor Costa, MD      . pantoprazole (PROTONIX) 80 mg in sodium chloride 0.9 % 250 mL (0.32 mg/mL) infusion  8 mg/hr Intravenous Continuous Quintella Reichert, MD 25 mL/hr at 01/03/16 0430 8 mg/hr at 01/03/16 0430  . PARoxetine (PAXIL) tablet 5 mg  5 mg Oral Daily Ivor Costa, MD      . simvastatin (ZOCOR) tablet 40 mg  40 mg Oral q1800 Ivor Costa, MD      . sodium chloride flush (NS) 0.9 % injection 3 mL  3 mL Intravenous Q12H Ivor Costa, MD   3 mL at 01/03/16 0701  . thiamine (VITAMIN B-1) tablet 100 mg  100 mg Oral Daily Ivor Costa, MD       Or  . thiamine (B-1) injection 100 mg  100 mg Intravenous Daily Ivor Costa, MD      . triamcinolone cream (KENALOG) 0.1 %   Topical BID Ivor Costa, MD      . zolpidem [facility]) tablet 5 mg  5 mg Oral QHS PRN Ivor Costa, MD        Allergies as of 01/02/2016 - Review Complete 01/02/2016  Allergen Reaction Noted  . Azithromycin Itching and Swelling 10/17/2006    Family History  Problem Relation Age of Onset  . Hyperlipidemia Mother   . Hypertension Mother     Social History   Social History  . Marital Status: Divorced    Spouse Name: N/A  . Number of Children: 3  . Years of Education: 12   Occupational History  . Retired    Social History Main Topics  . Smoking status: Current Some Day Smoker -- 0.15 packs/day for 6 years    Types: Cigarettes  . Smokeless tobacco: Never Used  . Alcohol Use: 0.0 oz/week    0 Standard drinks or equivalent per week     Comment: socially  . Drug Use: No  . Sexual Activity: Yes    Other Topics Concern  . Not on file   Social History Narrative   Fun: play cards, play with her grandchildren, cooking   Denies religious beliefs effecting health care.     Review of Systems: ROS 01/03/16 Dr. Blaine Hamper reviewed and I agree Physical Exam: Vital signs in last 24 hours: Temp:  [97.7 F (36.5 C)-98.9 F (37.2 C)] 97.8 F (36.6 C) (02/22 0739) Pulse Rate:  [77-118] 86 (02/22 0805) Resp:  [13-21] 19 (02/22 0805) BP: (91-167)/(56-95) 115/94 mmHg (02/22 0805) SpO2:  [97 %-100 %] 100 % (02/22 0805) Weight:  [65.772 kg (145 lb)] 65.772 kg (145 lb) (02/21 2319) Last BM Date: 01/02/16 General:   Alert,  Thin but not cachectic-appearing, older-appearing than stated age, NAD Head:  Normocephalic and atraumatic. Eyes:  Sclera clear, no icterus.   Conjunctiva pink. Ears:  Normal auditory acuity. Nose:  No deformity, discharge,  or lesions. Mouth:  No deformity or lesions.  Oropharynx pink & moist. Neck:  Supple; no masses or thyromegaly. Lungs:  Clear throughout to auscultation.   No wheezes, crackles, or rhonchi. No acute distress. Heart:  Regular rate and rhythm; no murmurs, clicks, rubs,  or gallops. Abdomen:  Soft, mild epigastric tenderness without peritonitis, scattered normal-sounding bowel sounds. No masses, hepatosplenomegaly or hernias noted. Normal bowel sounds, without guarding, and without rebound.     Msk:  Symmetrical without gross deformities. Normal posture. Pulses:  Normal pulses noted. Extremities:  Without clubbing or edema. Neurologic:  Alert and  oriented x4; diffusely weak, otherwise grossly normal  neurologically. Skin:  Intact without significant lesions or rashes. Psych:  Alert and cooperative. Normal mood and affect.  Lab Results:  Recent Labs  01/03/16 0020 01/03/16 0030  WBC 9.6  --   HGB 7.4* 9.5*  HCT 23.2* 28.0*  PLT 208  --    BMET  Recent Labs  01/03/16 0020 01/03/16 0030  NA 139 139  K 4.2 4.4  CL 103 102  CO2 21*  --    GLUCOSE 160* 150*  BUN 38* 37*  CREATININE 1.07* 1.20*  CALCIUM 9.2  --    LFT  Recent Labs  01/03/16 0020  PROT 6.7  ALBUMIN 3.3*  AST 64*  ALT 30  ALKPHOS 255*  BILITOT 1.8*   PT/INR  Recent Labs  01/03/16 0020  LABPROT 15.1  INR 1.22    Studies/Results: No results found.  Impression:  1.  Hematemesis.  Resolved. 2.  Epigastric abdominal pain. 3.  Acute blood loss anemia. 4.  Alcohol abuse. 5.  Elevated LFTs, mild.  Likely alcohol-mediated.  Plan:  1.  NPO. 2.  PPI. 3.  Serial CBCs. 4.  IV fluids, transfusion as needed. 5.  Endoscopy today for further evaluation. 6.  Risks (bleeding, infection, bowel perforation that could require surgery, sedation-related changes in cardiopulmonary systems), benefits (identification and possible treatment of source of symptoms, exclusion of certain causes of symptoms), and alternatives (watchful waiting, radiographic imaging studies, empiric medical treatment) of upper endoscopy (EGD) were explained to patient/family in detail and patient wishes to proceed.   LOS: 0 days   Rucker Pridgeon M  01/03/2016, 8:19 AM  Pager [phone] If no answer or after 5 PM call [phone]

## 2016-01-04 ENCOUNTER — Encounter (HOSPITAL_COMMUNITY): Payer: Self-pay | Admitting: Gastroenterology

## 2016-01-04 DIAGNOSIS — K920 Hematemesis: Secondary | ICD-10-CM

## 2016-01-04 DIAGNOSIS — J4530 Mild persistent asthma, uncomplicated: Secondary | ICD-10-CM

## 2016-01-04 DIAGNOSIS — Z720 Tobacco use: Secondary | ICD-10-CM

## 2016-01-04 DIAGNOSIS — F10939 Alcohol use, unspecified with withdrawal, unspecified: Secondary | ICD-10-CM | POA: Diagnosis present

## 2016-01-04 DIAGNOSIS — M1990 Unspecified osteoarthritis, unspecified site: Secondary | ICD-10-CM

## 2016-01-04 DIAGNOSIS — F10230 Alcohol dependence with withdrawal, uncomplicated: Secondary | ICD-10-CM

## 2016-01-04 DIAGNOSIS — E785 Hyperlipidemia, unspecified: Secondary | ICD-10-CM

## 2016-01-04 DIAGNOSIS — L409 Psoriasis, unspecified: Secondary | ICD-10-CM

## 2016-01-04 DIAGNOSIS — F329 Major depressive disorder, single episode, unspecified: Secondary | ICD-10-CM

## 2016-01-04 DIAGNOSIS — F1010 Alcohol abuse, uncomplicated: Secondary | ICD-10-CM

## 2016-01-04 DIAGNOSIS — R197 Diarrhea, unspecified: Secondary | ICD-10-CM

## 2016-01-04 DIAGNOSIS — F10239 Alcohol dependence with withdrawal, unspecified: Secondary | ICD-10-CM | POA: Diagnosis present

## 2016-01-04 DIAGNOSIS — R1013 Epigastric pain: Secondary | ICD-10-CM

## 2016-01-04 DIAGNOSIS — K284 Chronic or unspecified gastrojejunal ulcer with hemorrhage: Secondary | ICD-10-CM

## 2016-01-04 DIAGNOSIS — D62 Acute posthemorrhagic anemia: Secondary | ICD-10-CM | POA: Diagnosis present

## 2016-01-04 LAB — TYPE AND SCREEN
ABO/RH(D): O POS
ANTIBODY SCREEN: NEGATIVE
Unit division: 0
Unit division: 0

## 2016-01-04 LAB — COMPREHENSIVE METABOLIC PANEL
ALBUMIN: 2.9 g/dL — AB (ref 3.5–5.0)
ALT: 20 U/L (ref 14–54)
AST: 45 U/L — AB (ref 15–41)
Alkaline Phosphatase: 188 U/L — ABNORMAL HIGH (ref 38–126)
Anion gap: 9 (ref 5–15)
BILIRUBIN TOTAL: 0.4 mg/dL (ref 0.3–1.2)
BUN: 15 mg/dL (ref 6–20)
CO2: 19 mmol/L — AB (ref 22–32)
Calcium: 8.1 mg/dL — ABNORMAL LOW (ref 8.9–10.3)
Chloride: 108 mmol/L (ref 101–111)
Creatinine, Ser: 0.7 mg/dL (ref 0.44–1.00)
GFR calc Af Amer: >60 mL/min (ref >60)
GFR calc non Af Amer: >60 mL/min (ref >60)
GLUCOSE: 110 mg/dL — AB (ref 65–99)
POTASSIUM: 3.5 mmol/L (ref 3.5–5.1)
Sodium: 136 mmol/L (ref 135–145)
TOTAL PROTEIN: 5.7 g/dL — AB (ref 6.5–8.1)

## 2016-01-04 LAB — CBC
HCT: 26.6 % — ABNORMAL LOW (ref 36.0–46.0)
HEMATOCRIT: 26.9 % — AB (ref 36.0–46.0)
Hemoglobin: 9 g/dL — ABNORMAL LOW (ref 12.0–15.0)
Hemoglobin: 8.7 g/dL — ABNORMAL LOW (ref 12.0–15.0)
MCH: 30.2 pg (ref 26.0–34.0)
MCH: 30.4 pg (ref 26.0–34.0)
MCHC: 33.5 g/dL (ref 30.0–36.0)
MCHC: 32.7 g/dL (ref 30.0–36.0)
MCV: 90.3 fL (ref 78.0–100.0)
MCV: 93 fL (ref 78.0–100.0)
Platelets: 221 10*3/uL (ref 150–400)
Platelets: 210 10*3/uL (ref 150–400)
RBC: 2.98 MIL/uL — ABNORMAL LOW (ref 3.87–5.11)
RBC: 2.86 MIL/uL — ABNORMAL LOW (ref 3.87–5.11)
RDW: 19.3 % — AB (ref 11.5–15.5)
RDW: 20.1 % — ABNORMAL HIGH (ref 11.5–15.5)
WBC: 11.8 10*3/uL — ABNORMAL HIGH (ref 4.0–10.5)
WBC: 9.9 10*3/uL (ref 4.0–10.5)

## 2016-01-04 LAB — GASTROINTESTINAL PANEL BY PCR, STOOL (REPLACES STOOL CULTURE)
ASTROVIRUS: NOT DETECTED
Adenovirus F40/41: NOT DETECTED
CAMPYLOBACTER SPECIES: NOT DETECTED
Cryptosporidium: NOT DETECTED
Cyclospora cayetanensis: NOT DETECTED
E. COLI O157: NOT DETECTED
ENTEROAGGREGATIVE E COLI (EAEC): NOT DETECTED
ENTEROTOXIGENIC E COLI (ETEC): NOT DETECTED
Entamoeba histolytica: NOT DETECTED
Enteropathogenic E coli (EPEC): NOT DETECTED
GIARDIA LAMBLIA: NOT DETECTED
NOROVIRUS GI/GII: NOT DETECTED
PLESIMONAS SHIGELLOIDES: NOT DETECTED
Rotavirus A: NOT DETECTED
SALMONELLA SPECIES: NOT DETECTED
SAPOVIRUS (I, II, IV, AND V): NOT DETECTED
SHIGELLA/ENTEROINVASIVE E COLI (EIEC): NOT DETECTED
Shiga like toxin producing E coli (STEC): NOT DETECTED
Vibrio cholerae: NOT DETECTED
Vibrio species: NOT DETECTED
Yersinia enterocolitica: NOT DETECTED

## 2016-01-04 LAB — HEPATITIS PANEL, ACUTE
HCV Ab: <0.1 s/co ratio (ref 0.0–0.9)
Hep A IgM: NEGATIVE
Hep B C IgM: NEGATIVE
Hepatitis B Surface Ag: NEGATIVE

## 2016-01-04 LAB — GLUCOSE, CAPILLARY: Glucose-Capillary: 119 mg/dL — ABNORMAL HIGH (ref 65–99)

## 2016-01-04 MED ORDER — TRAMADOL HCL 50 MG PO TABS
50.0000 mg | ORAL_TABLET | Freq: Four times a day (QID) | ORAL | Status: DC | PRN
  Administered 2016-01-04 – 2016-01-05 (×2): 50 mg via ORAL
  Filled 2016-01-04 (×2): qty 1

## 2016-01-04 MED ORDER — POTASSIUM CHLORIDE IN NACL 40-0.9 MEQ/L-% IV SOLN
INTRAVENOUS | Status: DC
  Administered 2016-01-04 – 2016-01-05 (×2): 75 mL/h via INTRAVENOUS
  Filled 2016-01-04 (×4): qty 1000

## 2016-01-04 MED ORDER — PANTOPRAZOLE SODIUM 40 MG PO PACK
40.0000 mg | PACK | Freq: Two times a day (BID) | ORAL | Status: DC
  Administered 2016-01-04 – 2016-01-05 (×3): 40 mg via ORAL
  Filled 2016-01-04 (×4): qty 20

## 2016-01-04 NOTE — Progress Notes (Signed)
Subjective: Less abdominal pain. No blood in stool. No diarrhea. Is hungry.  Objective: Vital signs in last 24 hours: Temp:  [97.9 F (36.6 C)-99.5 F (37.5 C)] 98.3 F (36.8 C) (02/23 0800) Pulse Rate:  [76-98] 89 (02/23 0900) Resp:  [12-24] 22 (02/23 0900) BP: (112-147)/(55-70) 139/68 mmHg (02/23 0808) SpO2:  [83 %-100 %] 100 % (02/23 0900) Weight:  [65.772 kg (145 lb)-69.9 kg (154 lb 1.6 oz)] 69.9 kg (154 lb 1.6 oz) (02/23 0332) Weight change: 0 kg (0 lb) Last BM Date: 01/03/16  PE: GEN:  NAD ABD:  Soft, minimal epigastric tenderness without peritonitis  Lab Results: CBC    Component Value Date/Time   WBC 9.9 01/04/2016 0333   RBC 2.86* 01/04/2016 0333   HGB 8.7* 01/04/2016 0333   HCT 26.6* 01/04/2016 0333   PLT 210 01/04/2016 0333   MCV 93.0 01/04/2016 0333   MCH 30.4 01/04/2016 0333   MCHC 32.7 01/04/2016 0333   RDW 20.1* 01/04/2016 0333   LYMPHSABS 1.9 01/03/2016 0020   MONOABS 0.5 01/03/2016 0020   EOSABS 0.1 01/03/2016 0020   BASOSABS 0.0 01/03/2016 0020   CMP     Component Value Date/Time   NA 136 01/04/2016 0333   K 3.5 01/04/2016 0333   CL 108 01/04/2016 0333   CO2 19* 01/04/2016 0333   GLUCOSE 110* 01/04/2016 0333   BUN 15 01/04/2016 0333   CREATININE 0.70 01/04/2016 0333   CALCIUM 8.1* 01/04/2016 0333   PROT 5.7* 01/04/2016 0333   ALBUMIN 2.9* 01/04/2016 0333   AST 45* 01/04/2016 0333   ALT 20 01/04/2016 0333   ALKPHOS 188* 01/04/2016 0333   BILITOT 0.4 01/04/2016 0333   GFRNONAA >60 01/04/2016 0333   GFRAA >60 01/04/2016 0333   H. Pylori serologies pending  Assessment:  1.  Hematemesis.  Likely from esophagitis +/- gastric ulcers. 2.  Gastric ulcers, suspect NSAID-mediated. 3.  Abdominal pain, likely from #2 above, improving. 4.  Anemia, acute blood loss, likely from #1 and #2 above.  Plan:  1.  Pantoprazole 40 mg po bid. 2.  Advance diet slowly. 3.  Follow CBCs. 4.  Await H. Pylori serologies. 5.  Anticipate discharge home  tomorrow, with close outpatient GI follow-up. 6.  [facility] will follow.   Landry Dyke 01/04/2016, 10:19 AM   Pager [phone] If no answer or after 5 PM call [phone]
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
## 2016-01-04 NOTE — Progress Notes (Signed)
Progress Note   [name] [id number] DOB: 1946-10-12 DOA: 01/02/2016 PCP: [name], FNP   Brief Narrative:   [name] is an 70 y.o. female with a PMH of hyperlipidemia, GERD, depression, asthma, alcohol abuse, tobacco abuse, left renal cell cancer who was admitted 01/02/16 with epigastric pain and complaints of melena. Admission hemoglobin was 7.4 mg/dL. Received 2 units of PRBCs on admission with a posttransfusion hemoglobin of 9.0 mg/dL. CT of the abdomen and pelvis showed fatty infiltration of the liver and thickening of the distal gastric/pyloric wall. GI subsequently consulted and underwent EGD 01/03/16. Source of bleeding felt to be from friable GE junction versus resolving small Mallory-Weiss tear.  Assessment/Plan:   Principal Problem:   Hematemesis/GI bleeding in the setting of GE junction stricture with friability, peptic ulcer disease in the setting of NSAID use with acute blood loss anemia/diarrhea/abdominal pain - Received 2 units of PRBCs on admission with post transfusion hemoglobin of 9.0 mg/dL. Hemoglobin 8.7 mg/dL this a.m. - Underwent EGD 01/02/15 with findings of friable GE junction (felt to be source of bleeding) and benign appearing ulcers.  -  Continue PPI drip for another 24 hours.  - Avoid NSAIDs and anticoagulants.  - Follow-up H. pylori serologies.  - Discontinue octreotide. - C. Difficile antigen positive but C. Difficile toxin negative, not consistent w/ acute infection. On empiric Flagyl. D/C. - Transfer to the floor.  Active Problems:   Hyperlipidemia - Continue Zocor.  Chol 141, LDL 72.    Asthma - Continue albuterol as needed.     Arthritis  - Counseled on avoiding NSAIDs.     Psoriasis - Continue triamcinolone cream.     Alcohol abuse with alcohol withdrawal - Continue Ativan per detox protocol. CIWA score was 6 this morning, and RN reports some withdrawal symptoms. - Continue thiamine and folic acid.  - F/U acute hepatitis  panel.    Tobacco abuse - Continue nicotine patch.     Depression - Continue Cymbalta and Paxil .     DVT Prophylaxis - SCDs ordered.   Family Communication/Anticipated D/C date and plan/Code Status   Family Communication: No family at the bedside. Disposition Plan: Home when stable. Anticipated D/C date:   01/05/16 if hemodynamically stable and no further bleeding. Code Status: Full code.   IV Access:    Peripheral IV   Procedures and diagnostic studies:   Ct Abdomen Pelvis W Contrast  01/03/2016  CLINICAL DATA:  Hematemesis, diarrhea, abdominal pain in upper abdomen for about 2 days, normal lipase history of left renal cancer EXAM: CT ABDOMEN AND PELVIS WITH CONTRAST TECHNIQUE: Multidetector CT imaging of the abdomen and pelvis was performed using the standard protocol following bolus administration of intravenous contrast. CONTRAST:  171mL OMNIPAQUE IOHEXOL 300 MG/ML  SOLN COMPARISON:  09/10/2013 lumbar spine MRI 11/28/15 FINDINGS: Lung bases are unremarkable. Sagittal images of the spine shows extensive degenerative changes with multilevel disc space flattening at endplate sclerotic changes with vacuum disc phenomenon lumbar spine. There is mild compression deformity upper endplate of L1 vertebral body. This was described on MRI 11/28/2015. There is mild compression deformity upper endplate of 624THL vertebral body. This is of indeterminate age. Clinical correlation is necessary. There is no definite evidence of acute fracture or bony protrusion in spinal canal at this level. Probable Schmorl's node deformity left lateral aspect upper endplate of L1 with slight progression from prior exam. She Enhanced liver shows mild hepatic fatty infiltration. The patient is status postcholecystectomy. CBD  measures 6 mm in diameter. The pancreas, spleen and adrenal glands are unremarkable. The kidneys shows symmetrical enhancement. Stable scarring/partial nephrectomy in upper pole of the left kidney.  Delayed renal images shows bilateral renal symmetrical excretion. Bilateral visualized proximal ureter is unremarkable. Tiny calcified retroperitoneal lymph nodes are stable from prior exam. No aortic aneurysm. Mild atherosclerotic calcifications of distal abdominal aorta and common iliac arteries. There is a tiny umbilical hernia containing fat and small mesenteric vessels without evidence of acute complication. No small bowel obstruction. No ascites or free air. No adenopathy. No thickened or dilated small bowel loops. Oral contrast material noted in terminal small bowel wall terminal ileum and right and transverse colon. There is no pericecal inflammation. Normal appendix. The urinary bladder is unremarkable. The patient is status post hysterectomy. No evidence of colitis or diverticulitis. Mild degenerative changes pubic symphysis. Moderate gas noted within rectum. Stable intramuscular lipoma just medial to proximal right femur lateral to right ischium. There is nonspecific mild thickening of distal gastric/pyloric wall. Mild distal gastritis or ulcer cannot be excluded. Clinical correlation is necessary. IMPRESSION: 1. Mild fatty infiltration of the liver again noted. No focal hepatic mass. Status postcholecystectomy. 2. No hydronephrosis or hydroureter. Stable scarring/postsurgical changes upper pole of the left kidney. Bilateral renal symmetrical excretion. 3. There is nonspecific mild thickening of distal gastric/pyloric wall. Mild distal gastritis or ulcer cannot be excluded. Clinical correlation is necessary. Please see axial image 33. 4. Again noted mild compression fracture upper endplate of L1 vertebral body which was described on recent lumbar spine MRI. There is slight progression probable Schmorl's node deformity left lateral aspect upper endplate of L1 vertebral body. There is mild compression deformity upper endplate of 624THL vertebral body of indeterminate age. Clinical correlation is necessary. 5.  No evidence of acute pancreatitis. 6. No small bowel obstruction. 7. No pericecal inflammation.  Normal appendix. 8. Tiny umbilical hernia containing fat and small mesenteric vessels without evidence of acute complication. 9. No distal colonic obstruction. Moderate gas noted within rectum. No colitis or diverticulitis. 10. Status post hysterectomy.  Unremarkable urinary bladder. Electronically Signed   By: Lahoma Crocker M.D.   On: 01/03/2016 11:11     Medical Consultants:    Gastroenterology  Anti-Infectives:   Flagyl 01/03/16---> 01/04/16  Subjective:    [name] denies diarrhea, N/V.  No black or bloody stools.  Wants to go home.  RN reports some tremulousness this a.m.  Objective:    Filed Vitals:   01/04/16 0332 01/04/16 0400 01/04/16 0500 01/04/16 0600  BP:    112/62  Pulse:  82 87 87  Temp: 97.9 F (36.6 C)     TempSrc: Oral     Resp:  15 18 16   Height:      Weight: 69.9 kg (154 lb 1.6 oz)     SpO2:  100% 99% 100%    Intake/Output Summary (Last 24 hours) at 01/04/16 0732 Last data filed at 01/04/16 0600  Gross per 24 hour  Intake 3899.5 ml  Output   1200 ml  Net 2699.5 ml   Filed Weights   01/02/16 2319 01/03/16 1055 01/04/16 0332  Weight: 65.772 kg (145 lb) 65.772 kg (145 lb) 69.9 kg (154 lb 1.6 oz)    Exam: Gen:  NAD, mildly tremulous Cardiovascular:  RRR, No M/R/G Respiratory:  Lungs CTAB Gastrointestinal:  Abdomen soft, ND, + BS Extremities:  No C/E/C   Data Reviewed:    Labs: Basic Metabolic Panel:  Recent Labs Lab 01/03/16  0020 01/03/16 0030 01/03/16 1000 01/04/16 0333  NA 139 139 136 136  K 4.2 4.4 4.0 3.5  CL 103 102 107 108  CO2 21*  --  20* 19*  GLUCOSE 160* 150* 81 110*  BUN 38* 37* 37* 15  CREATININE 1.07* 1.20* 0.81 0.70  CALCIUM 9.2  --  7.9* 8.1*   GFR Estimated Creatinine Clearance: 67 mL/min (by C-G formula based on Cr of 0.7). Liver Function Tests:  Recent Labs Lab 01/03/16 0020 01/03/16 1000 01/04/16 0333  AST  64* 46* 45*  ALT 30 22 20   ALKPHOS 255* 196* 188*  BILITOT 1.8* 1.1 0.4  PROT 6.7 5.6* 5.7*  ALBUMIN 3.3* 2.9* 2.9*    Recent Labs Lab 01/03/16 0020  LIPASE 17   Coagulation profile  Recent Labs Lab 01/03/16 0020  INR 1.22    CBC:  Recent Labs Lab 01/03/16 0020 01/03/16 0030 01/03/16 1000 01/04/16 0333  WBC 9.6  --  11.8* 9.9  NEUTROABS 7.0  --   --   --   HGB 7.4* 9.5* 9.0* 8.7*  HCT 23.2* 28.0* 26.9* 26.6*  MCV 100.0  --  90.3 93.0  PLT 208  --  221 210   Lipid Profile:  Recent Labs  01/03/16 1000  CHOL 141  HDL 47  LDLCALC 72  TRIG 111  CHOLHDL 3.0    Microbiology Recent Results (from the past 240 hour(s))  MRSA PCR Screening     Status: None   Collection Time: 01/03/16  2:53 AM  Result Value Ref Range Status   MRSA by PCR NEGATIVE NEGATIVE Final    Comment:        The GeneXpert MRSA Assay (FDA approved for NASAL specimens only), is one component of a comprehensive MRSA colonization surveillance program. It is not intended to diagnose MRSA infection nor to guide or monitor treatment for MRSA infections.   C difficile quick scan w PCR reflex     Status: Abnormal   Collection Time: 01/03/16  4:11 PM  Result Value Ref Range Status   C Diff antigen POSITIVE (A) NEGATIVE Final   C Diff toxin NEGATIVE NEGATIVE Final   C Diff interpretation   Final    C. difficile present, but toxin not detected. This indicates colonization. In most cases, this does not require treatment. If patient has signs and symptoms consistent with colitis, consider treatment. Requires ENTERIC precautions.     Medications:   . sodium chloride  10 mL/hr Intravenous Once  . DULoxetine  60 mg Oral Daily  . folic acid  1 mg Oral Daily  . LORazepam  0-4 mg Intravenous Q6H   Followed by  . [START ON 01/05/2016] LORazepam  0-4 mg Intravenous Q12H  . magnesium oxide  400 mg Oral Daily  . metroNIDAZOLE  500 mg Oral TID  . mometasone-formoterol  2 puff Inhalation BID  .  multivitamin with minerals  1 tablet Oral Daily  . nicotine  21 mg Transdermal Daily  . PARoxetine  5 mg Oral Daily  . simvastatin  40 mg Oral q1800  . sodium chloride flush  3 mL Intravenous Q12H  . thiamine  100 mg Oral Daily   Or  . thiamine  100 mg Intravenous Daily  . triamcinolone cream   Topical BID   Continuous Infusions: . sodium chloride 75 mL/hr at 01/03/16 0300  . lactated ringers Stopped (01/03/16 1315)  . octreotide  (SANDOSTATIN)    IV infusion 50 mcg/hr (01/04/16 0045)  . pantoprozole (  PROTONIX) infusion 8 mg/hr (01/04/16 0044)    Time spent: 25 minutes.   LOS: 1 day   RAMA,CHRISTINA  [facility] Pager [phone]. If unable to reach me by pager, please call my cell phone at [phone].  *Please refer to [email or web address], password [email or web address] to get updated schedule on who will round on this patient, as hospitalists switch teams weekly. If 7PM-7AM, please contact night-coverage at [email or web address], password [email or web address] for any overnight needs.  01/04/2016, 7:32 AM

## 2016-01-05 DIAGNOSIS — D62 Acute posthemorrhagic anemia: Secondary | ICD-10-CM

## 2016-01-05 LAB — BASIC METABOLIC PANEL
ANION GAP: 6 (ref 5–15)
BUN: 6 mg/dL (ref 6–20)
CALCIUM: 8.6 mg/dL — AB (ref 8.9–10.3)
CHLORIDE: 110 mmol/L (ref 101–111)
CO2: 24 mmol/L (ref 22–32)
Creatinine, Ser: 0.56 mg/dL (ref 0.44–1.00)
GFR calc non Af Amer: >60 mL/min (ref >60)
GLUCOSE: 107 mg/dL — AB (ref 65–99)
Potassium: 4.3 mmol/L (ref 3.5–5.1)
Sodium: 140 mmol/L (ref 135–145)

## 2016-01-05 LAB — H. PYLORI ANTIBODY, IGG

## 2016-01-05 LAB — CBC
HEMATOCRIT: 26.9 % — AB (ref 36.0–46.0)
HEMOGLOBIN: 8.6 g/dL — AB (ref 12.0–15.0)
MCH: 30.3 pg (ref 26.0–34.0)
MCHC: 32 g/dL (ref 30.0–36.0)
MCV: 94.7 fL (ref 78.0–100.0)
Platelets: 256 10*3/uL (ref 150–400)
RBC: 2.84 MIL/uL — ABNORMAL LOW (ref 3.87–5.11)
RDW: 20.2 % — AB (ref 11.5–15.5)
WBC: 9.9 10*3/uL (ref 4.0–10.5)

## 2016-01-05 LAB — GLUCOSE, CAPILLARY: Glucose-Capillary: 127 mg/dL — ABNORMAL HIGH (ref 65–99)

## 2016-01-05 MED ORDER — TRIAMCINOLONE ACETONIDE 0.1 % EX CREA: TOPICAL_CREAM | Freq: Two times a day (BID) | CUTANEOUS | Status: DC

## 2016-01-05 MED ORDER — TRAMADOL HCL 50 MG PO TABS: 50.0000 mg | ORAL_TABLET | Freq: Four times a day (QID) | ORAL | Status: DC | PRN

## 2016-01-05 MED ORDER — ZOLPIDEM TARTRATE 5 MG PO TABS
ORAL_TABLET | ORAL | Status: DC
Start: 2016-01-05 — End: 2016-02-23

## 2016-01-05 MED ORDER — FOLIC ACID 1 MG PO TABS: 1.0000 mg | ORAL_TABLET | Freq: Every day | ORAL | Status: DC

## 2016-01-05 MED ORDER — NICOTINE 21 MG/24HR TD PT24: 21.0000 mg | MEDICATED_PATCH | Freq: Every day | TRANSDERMAL | Status: DC

## 2016-01-05 MED ORDER — ADULT MULTIVITAMIN W/MINERALS CH: 1.0000 | ORAL_TABLET | Freq: Every day | ORAL | Status: DC

## 2016-01-05 MED ORDER — LORAZEPAM 1 MG PO TABS: 1.0000 mg | ORAL_TABLET | Freq: Four times a day (QID) | ORAL | Status: DC | PRN

## 2016-01-05 MED ORDER — PANTOPRAZOLE SODIUM 40 MG PO TBEC: 40.0000 mg | DELAYED_RELEASE_TABLET | Freq: Every day | ORAL | Status: DC

## 2016-01-05 MED ORDER — THIAMINE HCL 100 MG PO TABS: 100.0000 mg | ORAL_TABLET | Freq: Every day | ORAL | Status: DC

## 2016-01-05 NOTE — Discharge Instructions (Signed)

## 2016-01-05 NOTE — Progress Notes (Signed)
Subjective: Intermittent epigastric pain, overall improving. No hematemesis or blood in stool.  Objective: Vital signs in last 24 hours: Temp:  [98.2 F (36.8 C)-98.8 F (37.1 C)] 98.2 F (36.8 C) (02/24 0537) Pulse Rate:  [84-96] 88 (02/24 0537) Resp:  [18-21] 18 (02/24 0537) BP: (133-161)/(55-71) 133/55 mmHg (02/24 0537) SpO2:  [100 %] 100 % (02/24 0537) Weight change:  Last BM Date: 01/04/16  PE: GEN:  NAD  Lab Results: CBC    Component Value Date/Time   WBC 9.9 01/05/2016 0544   RBC 2.84* 01/05/2016 0544   HGB 8.6* 01/05/2016 0544   HCT 26.9* 01/05/2016 0544   PLT 256 01/05/2016 0544   MCV 94.7 01/05/2016 0544   MCH 30.3 01/05/2016 0544   MCHC 32.0 01/05/2016 0544   RDW 20.2* 01/05/2016 0544   LYMPHSABS 1.9 01/03/2016 0020   MONOABS 0.5 01/03/2016 0020   EOSABS 0.1 01/03/2016 0020   BASOSABS 0.0 01/03/2016 0020   CMP     Component Value Date/Time   NA 140 01/05/2016 0544   K 4.3 01/05/2016 0544   CL 110 01/05/2016 0544   CO2 24 01/05/2016 0544   GLUCOSE 107* 01/05/2016 0544   BUN 6 01/05/2016 0544   CREATININE 0.56 01/05/2016 0544   CALCIUM 8.6* 01/05/2016 0544   PROT 5.7* 01/04/2016 0333   ALBUMIN 2.9* 01/04/2016 0333   AST 45* 01/04/2016 0333   ALT 20 01/04/2016 0333   ALKPHOS 188* 01/04/2016 0333   BILITOT 0.4 01/04/2016 0333   GFRNONAA >60 01/05/2016 0544   GFRAA >60 01/05/2016 0544   H. Pylori seronegative  Assessment:  1. Hematemesis. Likely from esophagitis +/- gastric ulcers. 2. Gastric ulcers, suspect NSAID-mediated.  H. Pylori seronegative. 3. Abdominal pain, likely from #2 above, improving. 4. Anemia, acute blood loss, likely from #1 and #2 above.  Stable x 2 days.  Plan:  1.  Regular diet ok. 2.  No NSAIDs. 3.  Pantoprazole 40 mg po bid upon discharge and until further notice. 4.  OK for discharge home today from GI perspective; will arrange outpatient follow-up with me in a few weeks. 5.  Will sign-off; please call with  questions; thank you for the consultation.   Landry Dyke 01/05/2016, 9:53 AM   Pager [phone] If no answer or after 5 PM call [phone]

## 2016-01-05 NOTE — Discharge Summary (Signed)
Physician Discharge Summary  [name] [id number] DOB: [birth year] DOA: 01/02/2016  PCP: [name], FNP  Admit date: 01/02/2016 Discharge date: 01/05/2016   Recommendations for Outpatient Follow-Up:   1. F/U with PCP in 1 week for a repeat CBC to ensure hemoglobin stable.   Discharge Diagnosis:   Principal Problem:    Hematemesis secondary to friable GE junction vs small Mallory Weiss tear and PUD in the setting of NSAID use Active Problems:    Hyperlipidemia    Asthma    Arthritis    Psoriasis    Alcohol abuse    Tobacco abuse    GIB (gastrointestinal bleeding)    Diarrhea    Depression    Abdominal pain    Alcohol withdrawal (HCC)    Acute blood loss anemia   Discharge disposition:  Home.   Discharge Condition: Improved.  Diet recommendation: Low sodium, heart healthy.    History of Present Illness:   [name] is an 70 y.o. female with a PMH of hyperlipidemia, GERD, depression, asthma, alcohol abuse, tobacco abuse, left renal cell cancer who was admitted 01/02/16 with epigastric pain and complaints of melena. Admission hemoglobin was 7.4 mg/dL. Received 2 units of PRBCs on admission with a posttransfusion hemoglobin of 9.0 mg/dL. CT of the abdomen and pelvis showed fatty infiltration of the liver and thickening of the distal gastric/pyloric wall. GI subsequently consulted and underwent EGD 01/03/16. Source of bleeding felt to be from friable GE junction versus resolving small Mallory-Weiss tear.  Hospital Course by Problem:   Principal Problem:  Hematemesis/GI bleeding in the setting of GE junction stricture with friability, peptic ulcer disease in the setting of NSAID use with acute blood loss anemia/diarrhea/abdominal pain - Received 2 units of PRBCs on admission with post transfusion hemoglobin of 9.0 mg/dL. Hemoglobin 8.6 mg/dL this a.m. - Underwent EGD 01/02/15 with findings of friable GE junction (felt to be source of bleeding) and benign  appearing ulcers.  - Continue PPI.  - Avoid NSAIDs and anticoagulants.  - H. pylori serologies negative.  - C. Difficile antigen positive but C. Difficile toxin negative, not consistent w/ acute infection. Flagyl stopped 01/04/16. - Cleared for D/C by GI, no further bleeding, diet advanced.  Active Problems:  Hyperlipidemia - Continue Zocor. Chol 141, LDL 72.   Asthma - Continue albuterol as needed.    Arthritis  - Counseled on avoiding NSAIDs.    Psoriasis - Continue triamcinolone cream.    Alcohol abuse with alcohol withdrawal - Continue Ativan per detox protocol. CIWA score 0-1. Motivated to quit.  Counseled. - Continue thiamine and folic acid.  - Hepatitis panel negative.   Tobacco abuse - Continue nicotine patch.    Depression - Continue Cymbalta and Paxil .    Medical Consultants:    Dr. Arta Silence, GI   Discharge Exam:   Filed Vitals:   01/05/16 0537 01/05/16 1000  BP: 133/55 155/52  Pulse: 88 90  Temp: 98.2 F (36.8 C) 98.5 F (36.9 C)  Resp: 18 20   Filed Vitals:   01/04/16 1347 01/04/16 2056 01/05/16 0537 01/05/16 1000  BP: 161/71 147/67 133/55 155/52  Pulse: 84 96 88 90  Temp:  98.8 F (37.1 C) 98.2 F (36.8 C) 98.5 F (36.9 C)  TempSrc:  Oral Oral Oral  Resp:  19 18 20   Height:      Weight:      SpO2:  100% 100% 100%    Gen:  NAD Cardiovascular:  RRR,  No M/R/G Respiratory: Lungs CTAB Gastrointestinal: Abdomen soft, NT/ND with normal active bowel sounds. Extremities: No C/E/C   The results of significant diagnostics from this hospitalization (including imaging, microbiology, ancillary and laboratory) are listed below for reference.     Procedures and Diagnostic Studies:   Ct Abdomen Pelvis W Contrast  01/03/2016  CLINICAL DATA:  Hematemesis, diarrhea, abdominal pain in upper abdomen for about 2 days, normal lipase history of left renal cancer EXAM: CT ABDOMEN AND PELVIS WITH CONTRAST TECHNIQUE: Multidetector CT  imaging of the abdomen and pelvis was performed using the standard protocol following bolus administration of intravenous contrast. CONTRAST:  182mL OMNIPAQUE IOHEXOL 300 MG/ML  SOLN COMPARISON:  09/10/2013 lumbar spine MRI 11/28/15 FINDINGS: Lung bases are unremarkable. Sagittal images of the spine shows extensive degenerative changes with multilevel disc space flattening at endplate sclerotic changes with vacuum disc phenomenon lumbar spine. There is mild compression deformity upper endplate of L1 vertebral body. This was described on MRI 11/28/2015. There is mild compression deformity upper endplate of 624THL vertebral body. This is of indeterminate age. Clinical correlation is necessary. There is no definite evidence of acute fracture or bony protrusion in spinal canal at this level. Probable Schmorl's node deformity left lateral aspect upper endplate of L1 with slight progression from prior exam. She Enhanced liver shows mild hepatic fatty infiltration. The patient is status postcholecystectomy. CBD measures 6 mm in diameter. The pancreas, spleen and adrenal glands are unremarkable. The kidneys shows symmetrical enhancement. Stable scarring/partial nephrectomy in upper pole of the left kidney. Delayed renal images shows bilateral renal symmetrical excretion. Bilateral visualized proximal ureter is unremarkable. Tiny calcified retroperitoneal lymph nodes are stable from prior exam. No aortic aneurysm. Mild atherosclerotic calcifications of distal abdominal aorta and common iliac arteries. There is a tiny umbilical hernia containing fat and small mesenteric vessels without evidence of acute complication. No small bowel obstruction. No ascites or free air. No adenopathy. No thickened or dilated small bowel loops. Oral contrast material noted in terminal small bowel wall terminal ileum and right and transverse colon. There is no pericecal inflammation. Normal appendix. The urinary bladder is unremarkable. The patient  is status post hysterectomy. No evidence of colitis or diverticulitis. Mild degenerative changes pubic symphysis. Moderate gas noted within rectum. Stable intramuscular lipoma just medial to proximal right femur lateral to right ischium. There is nonspecific mild thickening of distal gastric/pyloric wall. Mild distal gastritis or ulcer cannot be excluded. Clinical correlation is necessary. IMPRESSION: 1. Mild fatty infiltration of the liver again noted. No focal hepatic mass. Status postcholecystectomy. 2. No hydronephrosis or hydroureter. Stable scarring/postsurgical changes upper pole of the left kidney. Bilateral renal symmetrical excretion. 3. There is nonspecific mild thickening of distal gastric/pyloric wall. Mild distal gastritis or ulcer cannot be excluded. Clinical correlation is necessary. Please see axial image 33. 4. Again noted mild compression fracture upper endplate of L1 vertebral body which was described on recent lumbar spine MRI. There is slight progression probable Schmorl's node deformity left lateral aspect upper endplate of L1 vertebral body. There is mild compression deformity upper endplate of 624THL vertebral body of indeterminate age. Clinical correlation is necessary. 5. No evidence of acute pancreatitis. 6. No small bowel obstruction. 7. No pericecal inflammation.  Normal appendix. 8. Tiny umbilical hernia containing fat and small mesenteric vessels without evidence of acute complication. 9. No distal colonic obstruction. Moderate gas noted within rectum. No colitis or diverticulitis. 10. Status post hysterectomy.  Unremarkable urinary bladder. Electronically Signed   By: Julien Girt  Pop M.D.   On: 01/03/2016 11:11   EGD 01/03/16  ENDOSCOPIC IMPRESSION: As above. Hematemesis likely from friable GE junction, may be from resolving small Mallory-Weiss tear; no focal ulcer or mass lesion noted and no overt esophagitis noted. Large benign-appearing gastric ulcers could have contributed  to her melena and hematemesis as well. Suspect NSAIDs contributing.  RECOMMENDATIONS: 1. Watch for potential complications of procedure. 2. PPI drip for 24-48 more hours. 3. No NSAIDs or anticoagulants. 4. Check H. pylori serologies. 5. Sips ice chips for now; if no rebleeding, might consider clear liquids tonight. 6. Repeat endoscopy if rampant persistent hematemesis, otherwise would monitor medically and follow clinical course. 7. [facility] will follow.  Labs:   Basic Metabolic Panel:  Recent Labs Lab 01/03/16 0020 01/03/16 0030 01/03/16 1000 01/04/16 0333 01/05/16 0544  NA 139 139 136 136 140  K 4.2 4.4 4.0 3.5 4.3  CL 103 102 107 108 110  CO2 21*  --  20* 19* 24  GLUCOSE 160* 150* 81 110* 107*  BUN 38* 37* 37* 15 6  CREATININE 1.07* 1.20* 0.81 0.70 0.56  CALCIUM 9.2  --  7.9* 8.1* 8.6*   GFR Estimated Creatinine Clearance: 67 mL/min (by C-G formula based on Cr of 0.56). Liver Function Tests:  Recent Labs Lab 01/03/16 0020 01/03/16 1000 01/04/16 0333  AST 64* 46* 45*  ALT 30 22 20   ALKPHOS 255* 196* 188*  BILITOT 1.8* 1.1 0.4  PROT 6.7 5.6* 5.7*  ALBUMIN 3.3* 2.9* 2.9*    Recent Labs Lab 01/03/16 0020  LIPASE 17    Coagulation profile  Recent Labs Lab 01/03/16 0020  INR 1.22    CBC:  Recent Labs Lab 01/03/16 0020 01/03/16 0030 01/03/16 1000 01/04/16 0333 01/05/16 0544  WBC 9.6  --  11.8* 9.9 9.9  NEUTROABS 7.0  --   --   --   --   HGB 7.4* 9.5* 9.0* 8.7* 8.6*  HCT 23.2* 28.0* 26.9* 26.6* 26.9*  MCV 100.0  --  90.3 93.0 94.7  PLT 208  --  221 210 256   CBG:  Recent Labs Lab 01/04/16 0810 01/05/16 0738  GLUCAP 119* 127*   Lipid Profile  Recent Labs  01/03/16 1000  CHOL 141  HDL 47  LDLCALC 72  TRIG 111  CHOLHDL 3.0   Microbiology Recent Results (from the past 240 hour(s))  MRSA PCR Screening     Status: None   Collection Time: 01/03/16  2:53 AM  Result Value Ref Range Status   MRSA by PCR NEGATIVE  NEGATIVE Final    Comment:        The GeneXpert MRSA Assay (FDA approved for NASAL specimens only), is one component of a comprehensive MRSA colonization surveillance program. It is not intended to diagnose MRSA infection nor to guide or monitor treatment for MRSA infections.   C difficile quick scan w PCR reflex     Status: Abnormal   Collection Time: 01/03/16  4:11 PM  Result Value Ref Range Status   C Diff antigen POSITIVE (A) NEGATIVE Final   C Diff toxin NEGATIVE NEGATIVE Final   C Diff interpretation   Final    C. difficile present, but toxin not detected. This indicates colonization. In most cases, this does not require treatment. If patient has signs and symptoms consistent with colitis, consider treatment. Requires ENTERIC precautions.  Gastrointestinal Panel by PCR , Stool     Status: None   Collection Time: 01/03/16  4:11 PM  Result Value Ref Range Status   Campylobacter species NOT DETECTED NOT DETECTED Final   Plesimonas shigelloides NOT DETECTED NOT DETECTED Final   Salmonella species NOT DETECTED NOT DETECTED Final   Yersinia enterocolitica NOT DETECTED NOT DETECTED Final   Vibrio species NOT DETECTED NOT DETECTED Final   Vibrio cholerae NOT DETECTED NOT DETECTED Final   Enteroaggregative E coli (EAEC) NOT DETECTED NOT DETECTED Final   Enteropathogenic E coli (EPEC) NOT DETECTED NOT DETECTED Final   Enterotoxigenic E coli (ETEC) NOT DETECTED NOT DETECTED Final   Shiga like toxin producing E coli (STEC) NOT DETECTED NOT DETECTED Final   E. coli O157 NOT DETECTED NOT DETECTED Final   Shigella/Enteroinvasive E coli (EIEC) NOT DETECTED NOT DETECTED Final   Cryptosporidium NOT DETECTED NOT DETECTED Final   Cyclospora cayetanensis NOT DETECTED NOT DETECTED Final   Entamoeba histolytica NOT DETECTED NOT DETECTED Final   Giardia lamblia NOT DETECTED NOT DETECTED Final   Adenovirus F40/41 NOT DETECTED NOT DETECTED Final   Astrovirus NOT DETECTED NOT DETECTED Final    Norovirus GI/GII NOT DETECTED NOT DETECTED Final   Rotavirus A NOT DETECTED NOT DETECTED Final   Sapovirus (I, II, IV, and V) NOT DETECTED NOT DETECTED Final     Discharge Instructions:   Discharge Instructions    Call MD for:  persistant nausea and vomiting    Complete by:  As directed      Call MD for:  severe uncontrolled pain    Complete by:  As directed      Diet - low sodium heart healthy    Complete by:  As directed      Discharge instructions    Complete by:  As directed   Avoid any over-the-counter pain relievers except for tylenol (no aspirin, Motrin, Aleve, Ibuprofen, Naprosyn, etc); STOP MOBIC (MELOXICAM).  Call your doctor for any recurrent black or bloody stools, dizziness, weakness, or feeling like you might pass out.  Avoid alcohol.  DO NOT TAKE ATIVAN OR AMBIEN IF YOU ARE DRINKING ALCOHOL.     Increase activity slowly    Complete by:  As directed             Medication List    STOP taking these medications        famotidine 20 MG tablet  Commonly known as:  PEPCID     HYDROcodone-acetaminophen 5-325 MG tablet  Commonly known as:  NORCO/VICODIN     meloxicam 15 MG tablet  Commonly known as:  MOBIC     methocarbamol 750 MG tablet  Commonly known as:  ROBAXIN     predniSONE 10 MG tablet  Commonly known as:  DELTASONE      TAKE these medications        albuterol 108 (90 Base) MCG/ACT inhaler  Commonly known as:  PROVENTIL HFA;VENTOLIN HFA  Inhale 2 puffs into the lungs every 6 (six) hours as needed for wheezing or shortness of breath.     [employer] STOOL SOFTENER 100 MG capsule  Generic drug:  docusate sodium  TAKE 1-2 CAPSULES BY MOUTH DAILY AS NEEDED FOR MILD CONSTIPATION.     DULoxetine 60 MG capsule  Commonly known as:  CYMBALTA  Take 1 capsule (60 mg total) by mouth daily.     Fluticasone-Salmeterol 500-50 MCG/DOSE Aepb  Commonly known as:  ADVAIR  Inhale 1 puff into the lungs 2 (two) times daily.     folic acid 1 MG tablet  Commonly  known as:  [employer]  Take 1 tablet (1 mg total) by mouth daily.     LORazepam 1 MG tablet  Commonly known as:  ATIVAN  Take 1 tablet (1 mg total) by mouth every 6 (six) hours as needed (Anxiety, shakiness. DO NOT USE WITH ALCOHOL.).     Magnesium Oxide 400 (240 Mg) MG Tabs  Take 1 tablet by mouth daily.     montelukast 10 MG tablet  Commonly known as:  SINGULAIR  Take 1 tablet (10 mg total) by mouth at bedtime.     multivitamin with minerals Tabs tablet  Take 1 tablet by mouth daily.     nicotine 21 mg/24hr patch  Commonly known as:  NICODERM CQ - dosed in mg/24 hours  Place 1 patch (21 mg total) onto the skin daily.     pantoprazole 40 MG tablet  Commonly known as:  PROTONIX  Take 1 tablet (40 mg total) by mouth daily.     PARoxetine 10 MG tablet  Commonly known as:  PAXIL  Take 0.5 tablets (5 mg total) by mouth daily.     potassium chloride SA 20 MEQ tablet  Commonly known as:  KLOR-CON M20  Take 1 tablet (20 mEq total) by mouth 2 (two) times daily with a meal.     simvastatin 40 MG tablet  Commonly known as:  ZOCOR  Take 1 tablet (40 mg total) by mouth every morning.     thiamine 100 MG tablet  Take 1 tablet (100 mg total) by mouth daily.     traMADol 50 MG tablet  Commonly known as:  ULTRAM  Take 1 tablet (50 mg total) by mouth every 6 (six) hours as needed for moderate pain.     triamcinolone cream 0.1 %  Commonly known as:  KENALOG  Apply topically 2 (two) times daily.     zolpidem 5 MG tablet  Commonly known as:  AMBIEN  TAKE 1 TABLET BY MOUTH EVERY DAY AT BEDTIME AS NEEDED FOR SLEEP           Follow-up Information    Follow up with [name], FNP. Schedule an appointment as soon as possible for a visit in 1 week.   Specialty:  Family Medicine   Why:  For hospital follow up and to check your blood counts.   Contact information:   [place] [address] [phone]        Time coordinating discharge: 35  minutes.  Signed:  RAMA,CHRISTINA  Pager [phone] [facility] 01/05/2016, 11:14 AM

## 2016-01-05 NOTE — Progress Notes (Signed)
[facility]    [facility] is providing the following services: Patient declined rolling walker  If patient discharges after hours, please call [phone].   Linward Headland 01/05/2016, 11:50 AM

## 2016-01-05 NOTE — Progress Notes (Signed)
Patient given discharge instructions, and verbalized an understanding of all discharge instructions.  Patient agrees with discharge plan, and is being discharged in stable medical condition.  Patient given transportation via wheelchair. 

## 2016-01-08 ENCOUNTER — Other Ambulatory Visit: Payer: Self-pay | Admitting: Family

## 2016-01-08 NOTE — Telephone Encounter (Signed)
Please advise. Not on her med list

## 2016-01-09 ENCOUNTER — Telehealth: Payer: Self-pay | Admitting: *Deleted

## 2016-01-09 NOTE — Telephone Encounter (Signed)
Transition Care Management Follow-up Telephone Call   Date discharged? 01/05/16   How have you been since you were released from the hospital? Pt states she is doing ok   Do you understand why you were in the hospital? YES   Do you understand the discharge instructions? YES   Where were you discharged to? Home   Items Reviewed:  Medications reviewed: YES  Allergies reviewed: YES  Dietary changes reviewed: YES  Referrals reviewed: No referral needed   Functional Questionnaire:   Activities of Daily Living (ADLs):   She states she are independent in the following: ambulation, bathing and hygiene, feeding, continence, grooming, toileting and dressing States she doesn't require assistance   Any transportation issues/concerns?: NO   Any patient concerns? YES   Confirmed importance and date/time of follow-up visits scheduled YES, appt 01/15/16  Provider Appointment booked with Terri Piedra  Confirmed with patient if condition begins to worsen call PCP or go to the ER.  Patient was given the office number and encouraged to call back with question or concerns.  : YES

## 2016-01-15 ENCOUNTER — Ambulatory Visit: Payer: Medicare HMO | Admitting: Family

## 2016-01-19 ENCOUNTER — Other Ambulatory Visit (INDEPENDENT_AMBULATORY_CARE_PROVIDER_SITE_OTHER): Payer: Medicare HMO

## 2016-01-19 ENCOUNTER — Ambulatory Visit (INDEPENDENT_AMBULATORY_CARE_PROVIDER_SITE_OTHER): Payer: Medicare HMO | Admitting: Family

## 2016-01-19 ENCOUNTER — Encounter: Payer: Self-pay | Admitting: Family

## 2016-01-19 VITALS — BP 130/70 | HR 88 | Temp 98.3°F | Ht 65.0 in | Wt 148.8 lb

## 2016-01-19 DIAGNOSIS — K284 Chronic or unspecified gastrojejunal ulcer with hemorrhage: Secondary | ICD-10-CM

## 2016-01-19 DIAGNOSIS — F1010 Alcohol abuse, uncomplicated: Secondary | ICD-10-CM | POA: Diagnosis not present

## 2016-01-19 DIAGNOSIS — G8929 Other chronic pain: Secondary | ICD-10-CM

## 2016-01-19 DIAGNOSIS — J302 Other seasonal allergic rhinitis: Secondary | ICD-10-CM | POA: Diagnosis not present

## 2016-01-19 DIAGNOSIS — M549 Dorsalgia, unspecified: Secondary | ICD-10-CM | POA: Diagnosis not present

## 2016-01-19 LAB — COMPREHENSIVE METABOLIC PANEL
ALT: 19 U/L (ref 0–35)
AST: 36 U/L (ref 0–37)
Albumin: 3.8 g/dL (ref 3.5–5.2)
Alkaline Phosphatase: 235 U/L — ABNORMAL HIGH (ref 39–117)
BUN: 10 mg/dL (ref 6–23)
CALCIUM: 9.1 mg/dL (ref 8.4–10.5)
CHLORIDE: 106 meq/L (ref 96–112)
CO2: 29 meq/L (ref 19–32)
CREATININE: 0.58 mg/dL (ref 0.40–1.20)
GFR: 133.21 mL/min (ref >60.00)
Glucose, Bld: 100 mg/dL — ABNORMAL HIGH (ref 70–99)
POTASSIUM: 4 meq/L (ref 3.5–5.1)
Sodium: 143 mEq/L (ref 135–145)
Total Bilirubin: 0.4 mg/dL (ref 0.2–1.2)
Total Protein: 7.6 g/dL (ref 6.0–8.3)

## 2016-01-19 LAB — CBC
HEMATOCRIT: 32.4 % — AB (ref 36.0–46.0)
Hemoglobin: 10.4 g/dL — ABNORMAL LOW (ref 12.0–15.0)
MCHC: 32.2 g/dL (ref 30.0–36.0)
MCV: 94.7 fl (ref 78.0–100.0)
PLATELETS: 628 10*3/uL — AB (ref 150.0–400.0)
RBC: 3.42 Mil/uL — ABNORMAL LOW (ref 3.87–5.11)
RDW: 24.3 % — ABNORMAL HIGH (ref 11.5–15.5)
WBC: 8.2 10*3/uL (ref 4.0–10.5)

## 2016-01-19 MED ORDER — METHOCARBAMOL 500 MG PO TABS: ORAL_TABLET | ORAL | Status: DC

## 2016-01-19 MED ORDER — TRAMADOL HCL 50 MG PO TABS: 50.0000 mg | ORAL_TABLET | Freq: Four times a day (QID) | ORAL | Status: DC | PRN

## 2016-01-19 MED ORDER — PAROXETINE HCL 10 MG PO TABS: 5.0000 mg | ORAL_TABLET | Freq: Every day | ORAL | Status: DC

## 2016-01-19 MED ORDER — FLUTICASONE-SALMETEROL 500-50 MCG/DOSE IN AEPB: 1.0000 | INHALATION_SPRAY | Freq: Two times a day (BID) | RESPIRATORY_TRACT | Status: DC

## 2016-01-19 MED ORDER — SIMVASTATIN 40 MG PO TABS: 40.0000 mg | ORAL_TABLET | Freq: Every morning | ORAL | Status: DC

## 2016-01-19 MED ORDER — ALBUTEROL SULFATE HFA 108 (90 BASE) MCG/ACT IN AERS: 2.0000 | INHALATION_SPRAY | Freq: Four times a day (QID) | RESPIRATORY_TRACT | Status: DC | PRN

## 2016-01-19 MED ORDER — LORAZEPAM 1 MG PO TABS: 1.0000 mg | ORAL_TABLET | Freq: Two times a day (BID) | ORAL | Status: DC | PRN

## 2016-01-19 MED ORDER — DULOXETINE HCL 60 MG PO CPEP: 60.0000 mg | ORAL_CAPSULE | Freq: Every day | ORAL | Status: DC

## 2016-01-19 MED ORDER — MAGNESIUM OXIDE -MG SUPPLEMENT 400 (240 MG) MG PO TABS: 1.0000 | ORAL_TABLET | Freq: Every day | ORAL | Status: DC

## 2016-01-19 MED ORDER — MONTELUKAST SODIUM 10 MG PO TABS: 10.0000 mg | ORAL_TABLET | Freq: Every day | ORAL | Status: DC

## 2016-01-19 NOTE — Patient Instructions (Signed)
Thank you for choosing [facility].  Summary/Instructions:  Your prescription(s) have been submitted to your pharmacy or been printed and provided for you. Please take as directed and contact our office if you believe you are having problem(s) with the medication(s) or have any questions.  Please stop by the lab on the basement level of the building for your blood work. Your results will be released to [facility] (or called to you) after review, usually within 72 hours after test completion. If any changes need to be made, you will be notified at that same time.  If your symptoms worsen or fail to improve, please contact our office for further instruction, or in case of emergency go directly to the emergency room at the closest medical facility.   Please continue to take Protonix.  Please limit/avoid alcohol with no greater than 1 drink per day.   Avoid Nonsteroidal antiinflammatories such as ibuprofen and naproxen (Aleve). STOP taking the mobic.   Continue to take your other medications as prescribed  Follow with with gastroenterology.

## 2016-01-19 NOTE — Progress Notes (Signed)
Subjective:    Patient ID: [name], female    DOB: Aug 19, 1946, 70 y.o.   MRN: [id number]  Chief Complaint  Patient presents with  . Hospitalization Follow-up    Alcohol abuse and abdominal pain    HPI:  [name] is a 70 y.o. female who  has a past medical history of Seasonal allergies; Hypercholesteremia; GERD (gastroesophageal reflux disease); Arthritis; H/O mumps; H/O measles; Insomnia; Asthma; Allergy; Renal cell carcinoma (2012); Psoriasis (a type of skin inflammation); Alcohol abuse; and Alcohol abuse. and presents today for a hospitalization follow up.   Recently evaluated in the emergency department and admitted to the hospital for hematemesis and black stools and red blood clots mixed in. She also expressed epigastric pain. Symptoms were described as severe, constant, and worsening. She was noted to have a malonic stool in the emergency department and be hypotensive which was improved with IV fluid administration however she remained tachycardic. She was started on a Protonix drip as well as octreotide. She was transfused 2 units of pack red blood cells for hemoglobin of 7. Hemoglobin improved to 9 following transfusion. CT scan of the abdomen revealed mild fatty infiltration of her liver and mild thickening of the distal pyloric wall consistent with potential gastritis. Underwent EGD on 01/03/16 with findings of a friable GE junction which was felt to be the source of the bleeding and benign appearing ulcers. She was instructed to continue the proton pump inhibitor and avoid NSAIDs and anticoagulants. H. pylori workup was negative. C. difficile antigen positive but no C. difficile toxin was noted. On discharge shows no further bleeding. She was instructed to follow-up with primary care for a CBC to ensure hemoglobin is stable and will follow-up with gastroenterology. All hospital records, labs and images were reviewed in detail.   Since leaving the hospital she reports that she has  had no further episodes of hematemesis, hematechezia, or melena. No abdominal pain, constipation, diarrhea, nausea or vomiting. She has cut down her alcohol intake to 1 drink per day or less and and is working to avoid non-steroidal anti-inflammatories. She is eating well and continues to take her medications as prescribed. For the GI bleeding she remains on pantoprazole with no adverse side effects.      Allergies  Allergen Reactions  . Azithromycin Itching and Swelling     Current Outpatient Prescriptions on File Prior to Visit  Medication Sig Dispense Refill  . [employer] STOOL SOFTENER 100 MG capsule TAKE 1-2 CAPSULES BY MOUTH DAILY AS NEEDED FOR MILD CONSTIPATION. 60 capsule 0  . folic acid (FOLVITE) 1 MG tablet Take 1 tablet (1 mg total) by mouth daily. 30 tablet 2  . Multiple Vitamin (MULTIVITAMIN WITH MINERALS) TABS tablet Take 1 tablet by mouth daily. 30 tablet 2  . nicotine (NICODERM CQ - DOSED IN MG/24 HOURS) 21 mg/24hr patch Place 1 patch (21 mg total) onto the skin daily. 28 patch 0  . pantoprazole (PROTONIX) 40 MG tablet Take 1 tablet (40 mg total) by mouth daily. 60 tablet 2  . potassium chloride SA (KLOR-CON M20) 20 MEQ tablet Take 1 tablet (20 mEq total) by mouth 2 (two) times daily with a meal. 30 tablet 2  . thiamine 100 MG tablet Take 1 tablet (100 mg total) by mouth daily. 30 tablet 2  . triamcinolone cream (KENALOG) 0.1 % Apply topically 2 (two) times daily. 30 g 2  . zolpidem (AMBIEN) 5 MG tablet TAKE 1 TABLET BY MOUTH EVERY DAY AT  BEDTIME AS NEEDED FOR SLEEP 30 tablet 0   No current facility-administered medications on file prior to visit.     Past Surgical History  Procedure Laterality Date  . Kidney surgery  12/2010    [facility]; partial nephrectomy  . Hernia repair  [id number]    supraumbilical repair  . Bunionectomy  04/2011  . Menisectomy  2010    left knee  . Cholecystectomy  11/13/2011    Procedure: LAPAROSCOPIC CHOLECYSTECTOMY WITH INTRAOPERATIVE CHOLANGIOGRAM;   Surgeon: Judieth Keens, DO;  Location: [facility];  Service: General;  Laterality: N/A;  . Esophagogastroduodenoscopy N/A 09/25/2013    Procedure: ESOPHAGOGASTRODUODENOSCOPY (EGD);  Surgeon: Lear Ng, MD;  Location: [facility];  Service: Endoscopy;  Laterality: N/A;  . Colonoscopy N/A 09/25/2013    Procedure: COLONOSCOPY;  Surgeon: Lear Ng, MD;  Location: [facility];  Service: Endoscopy;  Laterality: N/A;  . Abdominal hysterectomy  40years ago  . Tubal ligation  44 years ago  . Lumbar laminectomy/decompression microdiscectomy Right 10/12/2014    Procedure: HEMI LAMINECTOMY MICRODISCECTOMY L5-S1 RIGHT (1 LEVEL);  Surgeon: Tobi Bastos, MD;  Location: [facility];  Service: Orthopedics;  Laterality: Right;  . Esophagogastroduodenoscopy (egd) with propofol Left 01/03/2016    Procedure: ESOPHAGOGASTRODUODENOSCOPY (EGD) WITH PROPOFOL;  Surgeon: Arta Silence, MD;  Location: [facility];  Service: Endoscopy;  Laterality: Left;      Past Medical History  Diagnosis Date  . Seasonal allergies   . Hypercholesteremia     under control  . GERD (gastroesophageal reflux disease)     occasional  . Arthritis     back-severe, hips, right knee  . H/O mumps   . H/O measles   . Insomnia   . Asthma   . Allergy   . Renal cell carcinoma 2012    left  . Psoriasis (a type of skin inflammation)   . Alcohol abuse   . Alcohol abuse      Review of Systems  Constitutional: Negative for fever and chills.  Respiratory: Negative for chest tightness and shortness of breath.   Cardiovascular: Negative for chest pain, palpitations and leg swelling.  Gastrointestinal: Negative for nausea, vomiting, abdominal pain, diarrhea, constipation, blood in stool, abdominal distention, anal bleeding and rectal pain.      Objective:    BP 130/70 mmHg  Pulse 88  Temp(Src) 98.3 F (36.8 C) (Oral)  Ht 5\' 5"  (1.651 m)  Wt 148 lb 12 oz (67.473 kg)  BMI 24.75 kg/m2  SpO2 98% Nursing note and  vital signs reviewed.  Physical Exam  Constitutional: She is oriented to person, place, and time. She appears well-developed and well-nourished. No distress.  Cardiovascular: Normal rate, regular rhythm, normal heart sounds and intact distal pulses.   Pulmonary/Chest: Effort normal and breath sounds normal.  Abdominal: Normal appearance and bowel sounds are normal. She exhibits no mass. There is no hepatosplenomegaly. There is no tenderness. There is no rigidity, no guarding, no tenderness at McBurney's point and negative Murphy's sign.  Neurological: She is alert and oriented to person, place, and time.  Skin: Skin is warm and dry.  Psychiatric: She has a normal mood and affect. Her behavior is normal. Judgment and thought content normal.       Assessment & Plan:   Problem List Items Addressed This Visit      Respiratory   Seasonal allergies - Primary   Relevant Medications   montelukast (SINGULAIR) 10 MG tablet     Digestive   GIB (gastrointestinal bleeding)  Gastrointestinal bleeding appears resolved with no further episodes of hematemesis or hematochezia. Obtain CBC and complete metabolic panel. Instructed to avoid nonsteroidal anti-inflammatories and limit alcohol to no more than 1 drink per day ideally abstaining from alcohol altogether. Continue Protonix at current dosage. Follow-up with gastroenterology as scheduled.      Relevant Orders   CBC (Completed)   Comprehensive metabolic panel (Completed)     Other   Chronic back pain   Relevant Medications   methocarbamol (ROBAXIN) 500 MG tablet   traMADol (ULTRAM) 50 MG tablet   Alcohol abuse

## 2016-01-19 NOTE — Assessment & Plan Note (Signed)
Gastrointestinal bleeding appears resolved with no further episodes of hematemesis or hematochezia. Obtain CBC and complete metabolic panel. Instructed to avoid nonsteroidal anti-inflammatories and limit alcohol to no more than 1 drink per day ideally abstaining from alcohol altogether. Continue Protonix at current dosage. Follow-up with gastroenterology as scheduled.

## 2016-01-19 NOTE — Progress Notes (Signed)
Pre visit review using our clinic review tool, if applicable. No additional management support is needed unless otherwise documented below in the visit note. 

## 2016-01-21 ENCOUNTER — Encounter: Payer: Self-pay | Admitting: Family

## 2016-02-23 ENCOUNTER — Other Ambulatory Visit: Payer: Self-pay | Admitting: Family

## 2016-02-26 NOTE — Telephone Encounter (Signed)
Last refill was 01/05/16

## 2016-03-04 ENCOUNTER — Encounter (HOSPITAL_COMMUNITY): Payer: Self-pay | Admitting: *Deleted

## 2016-03-04 ENCOUNTER — Emergency Department (HOSPITAL_COMMUNITY)
Admission: EM | Admit: 2016-03-04 | Discharge: 2016-03-05 | Disposition: A | Discharge status: Home / Self Care | Payer: Medicare HMO | Attending: Emergency Medicine | Admitting: Emergency Medicine

## 2016-03-04 DIAGNOSIS — Z7951 Long term (current) use of inhaled steroids: Secondary | ICD-10-CM | POA: Insufficient documentation

## 2016-03-04 DIAGNOSIS — Z872 Personal history of diseases of the skin and subcutaneous tissue: Secondary | ICD-10-CM | POA: Diagnosis not present

## 2016-03-04 DIAGNOSIS — Z9851 Tubal ligation status: Secondary | ICD-10-CM | POA: Diagnosis not present

## 2016-03-04 DIAGNOSIS — E7800 Pure hypercholesterolemia, unspecified: Secondary | ICD-10-CM | POA: Diagnosis not present

## 2016-03-04 DIAGNOSIS — M159 Polyosteoarthritis, unspecified: Secondary | ICD-10-CM | POA: Insufficient documentation

## 2016-03-04 DIAGNOSIS — G4700 Insomnia, unspecified: Secondary | ICD-10-CM | POA: Diagnosis not present

## 2016-03-04 DIAGNOSIS — Z79899 Other long term (current) drug therapy: Secondary | ICD-10-CM | POA: Insufficient documentation

## 2016-03-04 DIAGNOSIS — Z7952 Long term (current) use of systemic steroids: Secondary | ICD-10-CM | POA: Insufficient documentation

## 2016-03-04 DIAGNOSIS — F17210 Nicotine dependence, cigarettes, uncomplicated: Secondary | ICD-10-CM | POA: Diagnosis not present

## 2016-03-04 DIAGNOSIS — R1084 Generalized abdominal pain: Secondary | ICD-10-CM | POA: Diagnosis not present

## 2016-03-04 DIAGNOSIS — Z9049 Acquired absence of other specified parts of digestive tract: Secondary | ICD-10-CM | POA: Insufficient documentation

## 2016-03-04 DIAGNOSIS — Z8619 Personal history of other infectious and parasitic diseases: Secondary | ICD-10-CM | POA: Diagnosis not present

## 2016-03-04 DIAGNOSIS — Z9889 Other specified postprocedural states: Secondary | ICD-10-CM | POA: Diagnosis not present

## 2016-03-04 DIAGNOSIS — Z90710 Acquired absence of both cervix and uterus: Secondary | ICD-10-CM | POA: Diagnosis not present

## 2016-03-04 DIAGNOSIS — J45909 Unspecified asthma, uncomplicated: Secondary | ICD-10-CM | POA: Diagnosis not present

## 2016-03-04 DIAGNOSIS — R1013 Epigastric pain: Secondary | ICD-10-CM

## 2016-03-04 DIAGNOSIS — K219 Gastro-esophageal reflux disease without esophagitis: Secondary | ICD-10-CM | POA: Insufficient documentation

## 2016-03-04 DIAGNOSIS — Z85528 Personal history of other malignant neoplasm of kidney: Secondary | ICD-10-CM | POA: Diagnosis not present

## 2016-03-04 DIAGNOSIS — R1010 Upper abdominal pain, unspecified: Secondary | ICD-10-CM | POA: Diagnosis present

## 2016-03-04 LAB — CBC
HCT: 35.3 % — ABNORMAL LOW (ref 36.0–46.0)
Hemoglobin: 11.7 g/dL — ABNORMAL LOW (ref 12.0–15.0)
MCH: 29.5 pg (ref 26.0–34.0)
MCHC: 33.1 g/dL (ref 30.0–36.0)
MCV: 89.1 fL (ref 78.0–100.0)
PLATELETS: 495 10*3/uL — AB (ref 150–400)
RBC: 3.96 MIL/uL (ref 3.87–5.11)
RDW: 18.3 % — ABNORMAL HIGH (ref 11.5–15.5)
WBC: 10.2 10*3/uL (ref 4.0–10.5)

## 2016-03-04 LAB — COMPREHENSIVE METABOLIC PANEL
ALK PHOS: 270 U/L — AB (ref 38–126)
ALT: 19 U/L (ref 14–54)
AST: 48 U/L — AB (ref 15–41)
Albumin: 3.5 g/dL (ref 3.5–5.0)
Anion gap: 13 (ref 5–15)
BILIRUBIN TOTAL: 0.4 mg/dL (ref 0.3–1.2)
BUN: 11 mg/dL (ref 6–20)
CALCIUM: 9.3 mg/dL (ref 8.9–10.3)
CO2: 28 mmol/L (ref 22–32)
CREATININE: 0.61 mg/dL (ref 0.44–1.00)
Chloride: 100 mmol/L — ABNORMAL LOW (ref 101–111)
GFR calc Af Amer: >60 mL/min (ref >60)
Glucose, Bld: 99 mg/dL (ref 65–99)
Potassium: 3.7 mmol/L (ref 3.5–5.1)
Sodium: 141 mmol/L (ref 135–145)
TOTAL PROTEIN: 7.9 g/dL (ref 6.5–8.1)

## 2016-03-04 LAB — LIPASE, BLOOD: Lipase: 17 U/L (ref 11–51)

## 2016-03-04 MED ORDER — PANTOPRAZOLE SODIUM 40 MG IV SOLR
40.0000 mg | INTRAVENOUS | Status: AC
  Administered 2016-03-04: 40 mg via INTRAVENOUS
  Filled 2016-03-04: qty 40

## 2016-03-04 MED ORDER — GI COCKTAIL ~~LOC~~: 30.0000 mL | Freq: Once | ORAL | Status: DC

## 2016-03-04 MED ORDER — MORPHINE SULFATE (PF) 4 MG/ML IV SOLN
4.0000 mg | Freq: Once | INTRAVENOUS | Status: AC
  Administered 2016-03-04: 4 mg via INTRAVENOUS
  Filled 2016-03-04: qty 1

## 2016-03-04 MED ORDER — ONDANSETRON HCL 4 MG/2ML IJ SOLN
4.0000 mg | Freq: Once | INTRAMUSCULAR | Status: AC
  Administered 2016-03-04: 4 mg via INTRAVENOUS
  Filled 2016-03-04: qty 2

## 2016-03-04 NOTE — ED Notes (Signed)
Pt arrives to the ER via EMS for complaints of abd pain; pt states that she has had ulcers in the past and that this pain feels similar; pt denies N/V/D

## 2016-03-04 NOTE — ED Notes (Signed)
Bed: [id number] Expected date:  Expected time:  Means of arrival:  Comments: EMS 70yo F lower abd pain

## 2016-03-04 NOTE — ED Provider Notes (Signed)
CSN: [id number]     Arrival date & time 03/04/16  2217 History  By signing my name below, I, Helane Gunther, attest that this documentation has been prepared under the direction and in the presence of Sharlett Iles, MD. Electronically Signed: Helane Gunther, ED Scribe. 03/04/2016. 11:27 PM.      Chief Complaint  Patient presents with  . Abdominal Pain   The history is provided by the patient. No language interpreter was used.   HPI Comments: [name] is a 70 y.o. female smoker with a PMHx of GERD, alcohol abuse, left renal cell carcinoma, and hypercholesteremia, as well as a PSHx of kidney surgery, hernia repair, cholecystectomy, abdominal hysterectomy brought in by ambulance, who presents to the Emergency Department complaining of constant, worsening, upper abdominal pain onset this morning at about 10 AM. Pt states this feels similar to the pain she had when she went to the ED and was diagnosed with gastric ulcers in February of this year. She has not seen a GI specialist for this, but was prescribed Protonix when she was last discharged from the hospital. She notes she was not given any medication upon discharge and is unsure of ever having taken Protonix since then. She reports drinking "one of those health drinks" yesterday as well. She states she typically drinks 1-2 shots of liquor on a daily basis, but has not had any alcohol since last night. She denies taking any aleve, ibuprofen or tylenol recently. Pt denies n/v/d, bloody stool, black stool, fever, cough, congestion, and dysuria.    Past Medical History  Diagnosis Date  . Seasonal allergies   . Hypercholesteremia     under control  . GERD (gastroesophageal reflux disease)     occasional  . Arthritis     back-severe, hips, right knee  . H/O mumps   . H/O measles   . Insomnia   . Asthma   . Allergy   . Renal cell carcinoma 2012    left  . Psoriasis (a type of skin inflammation)   . Alcohol abuse   . Alcohol abuse     Past Surgical History  Procedure Laterality Date  . Kidney surgery  12/2010    [facility]; partial nephrectomy  . Hernia repair  [id number]    supraumbilical repair  . Bunionectomy  04/2011  . Menisectomy  2010    left knee  . Cholecystectomy  11/13/2011    Procedure: LAPAROSCOPIC CHOLECYSTECTOMY WITH INTRAOPERATIVE CHOLANGIOGRAM;  Surgeon: Judieth Keens, DO;  Location: [facility];  Service: General;  Laterality: N/A;  . Esophagogastroduodenoscopy N/A 09/25/2013    Procedure: ESOPHAGOGASTRODUODENOSCOPY (EGD);  Surgeon: Lear Ng, MD;  Location: [facility];  Service: Endoscopy;  Laterality: N/A;  . Colonoscopy N/A 09/25/2013    Procedure: COLONOSCOPY;  Surgeon: Lear Ng, MD;  Location: [facility];  Service: Endoscopy;  Laterality: N/A;  . Abdominal hysterectomy  40years ago  . Tubal ligation  44 years ago  . Lumbar laminectomy/decompression microdiscectomy Right 10/12/2014    Procedure: HEMI LAMINECTOMY MICRODISCECTOMY L5-S1 RIGHT (1 LEVEL);  Surgeon: Tobi Bastos, MD;  Location: [facility];  Service: Orthopedics;  Laterality: Right;  . Esophagogastroduodenoscopy (egd) with propofol Left 01/03/2016    Procedure: ESOPHAGOGASTRODUODENOSCOPY (EGD) WITH PROPOFOL;  Surgeon: Arta Silence, MD;  Location: [facility];  Service: Endoscopy;  Laterality: Left;   Family History  Problem Relation Age of Onset  . Hyperlipidemia Mother   . Hypertension Mother    Social History  Substance Use Topics  .  Smoking status: Current Some Day Smoker -- 0.15 packs/day for 6 years    Types: Cigarettes  . Smokeless tobacco: Never Used  . Alcohol Use: 0.0 oz/week    0 Standard drinks or equivalent per week     Comment: socially   OB History    Gravida Para Term Preterm AB TAB SAB Ectopic Multiple Living   3 3        3      Review of Systems 10 Systems reviewed and all are negative for acute change except as noted in the HPI.  Allergies  Azithromycin  Home Medications   Prior to  Admission medications   Medication Sig Start Date End Date Taking? Authorizing Provider  albuterol (PROVENTIL HFA;VENTOLIN HFA) 108 (90 Base) MCG/ACT inhaler Inhale 2 puffs into the lungs every 6 (six) hours as needed for wheezing or shortness of breath. 01/19/16  Yes Golden Circle, FNP  [employer]S STOOL SOFTENER 100 MG capsule TAKE 1-2 CAPSULES BY MOUTH DAILY AS NEEDED FOR MILD CONSTIPATION. 09/20/15  Yes Golden Circle, FNP  DULoxetine (CYMBALTA) 60 MG capsule Take 1 capsule (60 mg total) by mouth daily. 01/19/16  Yes Golden Circle, FNP  Fluticasone-Salmeterol (ADVAIR) 500-50 MCG/DOSE AEPB Inhale 1 puff into the lungs 2 (two) times daily. 01/19/16  Yes Golden Circle, FNP  folic acid (FOLVITE) 1 MG tablet Take 1 tablet (1 mg total) by mouth daily. 01/05/16  Yes Christina P Rama, MD  LORazepam (ATIVAN) 1 MG tablet Take 1 tablet (1 mg total) by mouth 2 (two) times daily as needed (Anxiety, shakiness. DO NOT USE WITH ALCOHOL.). 01/19/16  Yes Golden Circle, FNP  Magnesium Oxide 400 (240 Mg) MG TABS Take 1 tablet by mouth daily. 01/19/16  Yes Golden Circle, FNP  methocarbamol (ROBAXIN) 500 MG tablet TAKE 1 TABLET BY MOUTH 3 TIMES A DAY AS NEEDED FOR MUSCLE SPASM 01/19/16  Yes Golden Circle, FNP  montelukast (SINGULAIR) 10 MG tablet Take 1 tablet (10 mg total) by mouth at bedtime. 01/19/16  Yes Golden Circle, FNP  Multiple Vitamin (MULTIVITAMIN WITH MINERALS) TABS tablet Take 1 tablet by mouth daily. 01/05/16  Yes Christina P Rama, MD  nicotine (NICODERM CQ - DOSED IN MG/24 HOURS) 21 mg/24hr patch Place 1 patch (21 mg total) onto the skin daily. 01/05/16  Yes Christina P Rama, MD  pantoprazole (PROTONIX) 40 MG tablet Take 1 tablet (40 mg total) by mouth daily. 01/05/16  Yes Venetia Maxon Rama, MD  PARoxetine (PAXIL) 10 MG tablet Take 0.5 tablets (5 mg total) by mouth daily. 01/19/16  Yes Golden Circle, FNP  potassium chloride SA (KLOR-CON M20) 20 MEQ tablet Take 1 tablet (20 mEq total) by mouth 2 (two)  times daily with a meal. 10/25/15  Yes Golden Circle, FNP  simvastatin (ZOCOR) 40 MG tablet Take 1 tablet (40 mg total) by mouth every morning. 01/19/16  Yes Golden Circle, FNP  thiamine 100 MG tablet Take 1 tablet (100 mg total) by mouth daily. 01/05/16  Yes Venetia Maxon Rama, MD  traMADol (ULTRAM) 50 MG tablet Take 1 tablet (50 mg total) by mouth every 6 (six) hours as needed for moderate pain. 01/19/16  Yes Golden Circle, FNP  triamcinolone cream (KENALOG) 0.1 % Apply topically 2 (two) times daily. 01/05/16  Yes Christina P Rama, MD  zolpidem (AMBIEN) 5 MG tablet TAKE 1 TABLET BY MOUTH EVERY DAY AT BEDTIME AS NEEDED FOR SLEEP 02/26/16  Yes Golden Circle, FNP  BP 150/90 mmHg  Pulse 87  Temp(Src) 98.9 F (37.2 C) (Oral)  Resp 18  SpO2 99% Physical Exam  Constitutional: She is oriented to person, place, and time. She appears well-developed and well-nourished. She appears distressed.  Moaning due to pain  HENT:  Head: Normocephalic and atraumatic.  Moist mucous membranes  Eyes: Conjunctivae are normal.  Neck: Neck supple.  Cardiovascular: Normal rate, regular rhythm and normal heart sounds.   No murmur heard. Pulmonary/Chest: Effort normal and breath sounds normal.  Abdominal: Soft. Bowel sounds are normal. She exhibits no distension. There is tenderness. There is no rebound and no guarding.  Generalized TTP  Musculoskeletal: She exhibits no edema.  Neurological: She is alert and oriented to person, place, and time.  Fluent speech  Skin: Skin is warm and dry.  Psychiatric: Judgment normal.  Distressed, anxious  Nursing note and vitals reviewed.   ED Course  Procedures  DIAGNOSTIC STUDIES: Oxygen Saturation is 99% on RA, normal by my interpretation.    COORDINATION OF CARE: 11:26 PM - Discussed plans to order diagnostic studies and imaging. Will order something for pain and nausea. Pt advised of plan for treatment and pt agrees.  Labs Review Labs Reviewed   COMPREHENSIVE METABOLIC PANEL - Abnormal; Notable for the following:    Chloride 100 (*)    AST 48 (*)    Alkaline Phosphatase 270 (*)    All other components within normal limits  CBC - Abnormal; Notable for the following:    Hemoglobin 11.7 (*)    HCT 35.3 (*)    RDW 18.3 (*)    Platelets 495 (*)    All other components within normal limits  URINALYSIS, ROUTINE W REFLEX MICROSCOPIC (NOT AT [facility]) - Abnormal; Notable for the following:    Leukocytes, UA SMALL (*)    All other components within normal limits  URINE MICROSCOPIC-ADD ON - Abnormal; Notable for the following:    Squamous Epithelial / LPF 0-5 (*)    Bacteria, UA MANY (*)    All other components within normal limits  LIPASE, BLOOD    Imaging Review No results found. I have personally reviewed and evaluated these lab results as part of my medical decision-making.   EKG Interpretation None      Medications  pantoprazole (PROTONIX) injection 40 mg (40 mg Intravenous Given 03/04/16 2337)  morphine 4 MG/ML injection 4 mg (4 mg Intravenous Given 03/04/16 2337)  ondansetron (ZOFRAN) injection 4 mg (4 mg Intravenous Given 03/04/16 2337)  diatrizoate meglumine-sodium (GASTROGRAFIN) 66-10 % solution 30 mL (30 mLs Oral Given 03/05/16 0156)    MDM   Final diagnoses:  Epigastric pain   Patient presents with severe abdominal pain since this morning associated with nausea, similar to the pain that she had previously with peptic ulcer disease. On exam, she was moaning and in moderate distress due to pain. Vital signs notable for mild hypertension. She had generalized tenderness to palpation without rebound or guarding, and no distention. Obtained above lab work which showed normal WBC count, AST 48, alkaline phosphatase 270. Because of the severity of the patient's pain and her tenderness on exam, obtained a CT of the abdomen and pelvis to rule out acute process such as ruptured ulcer. Gave the patient protoniX, morphine, and  Zofran.   UA with small leukocytes, some squamous cells, some bacteria. Urine culture sent but patient denies any urinary symptoms and no lower abdominal tenderness specifically. CT shows no acute abnormality to explain the patient's symptoms. Because  of the patient's history of peptic ulcers and endorsement that the pain feels very similar, I suspect PUD as cause. On reexamination, the patient was sleeping comfortably and her abdominal pain was much improved. I discussed treatment for PUD including continuing protonix and I provided her with Carafate. Also discussed dietary changes to avoid acidic foods. Instructed to follow-up with PCP in a few days as patient may need a gastroenterology referral if not improved. Return precautions including bloody stools, hematemesis, or fever reviewed. Patient voiced understanding was discharged in satisfactory condition.  I personally performed the services described in this documentation, which was scribed in my presence. The recorded information has been reviewed and is accurate.   Sharlett Iles, MD 03/05/16 [phone]

## 2016-03-05 ENCOUNTER — Emergency Department (HOSPITAL_COMMUNITY): Payer: Medicare HMO

## 2016-03-05 ENCOUNTER — Encounter (HOSPITAL_COMMUNITY): Payer: Self-pay

## 2016-03-05 LAB — URINE MICROSCOPIC-ADD ON

## 2016-03-05 LAB — URINALYSIS, ROUTINE W REFLEX MICROSCOPIC
Bilirubin Urine: NEGATIVE
Glucose, UA: NEGATIVE mg/dL
Hgb urine dipstick: NEGATIVE
KETONES UR: NEGATIVE mg/dL
NITRITE: NEGATIVE
PH: 6.5 (ref 5.0–8.0)
PROTEIN: NEGATIVE mg/dL
Specific Gravity, Urine: 1.005 (ref 1.005–1.030)

## 2016-03-05 MED ORDER — DIATRIZOATE MEGLUMINE & SODIUM 66-10 % PO SOLN
30.0000 mL | Freq: Once | ORAL | Status: AC
  Administered 2016-03-05: 30 mL via ORAL

## 2016-03-05 MED ORDER — IOPAMIDOL (ISOVUE-300) INJECTION 61%
100.0000 mL | Freq: Once | INTRAVENOUS | Status: AC | PRN
  Administered 2016-03-05: 100 mL via INTRAVENOUS

## 2016-03-05 MED ORDER — SUCRALFATE 1 G PO TABS: 1.0000 g | ORAL_TABLET | Freq: Three times a day (TID) | ORAL | Status: DC

## 2016-03-05 NOTE — Discharge Instructions (Signed)
MAKE SURE THAT YOU ARE TAKING THE PROTONIX MEDICATION PRESCRIBED TO YOU BY YOUR PRIMARY CARE PROVIDER.

## 2016-03-07 LAB — URINE CULTURE: Special Requests: NORMAL

## 2016-03-08 ENCOUNTER — Telehealth: Payer: Self-pay | Admitting: *Deleted

## 2016-03-08 NOTE — Progress Notes (Signed)
ED Antimicrobial Stewardship Positive Culture Follow Up   [name] is an 70 y.o. female who presented to [facility] on 03/04/2016 with a chief complaint of  Chief Complaint  Patient presents with  . Abdominal Pain    Recent Results (from the past 720 hour(s))  Urine culture     Status: Abnormal   Collection Time: 03/05/16  1:38 AM  Result Value Ref Range Status   Specimen Description URINE, CLEAN CATCH  Final   Special Requests Normal  Final   Culture >=100,000 COLONIES/mL ESCHERICHIA COLI (A)  Final   Report Status 03/07/2016 FINAL  Final   Organism ID, Bacteria ESCHERICHIA COLI (A)  Final      Susceptibility   Escherichia coli - MIC*    AMPICILLIN <=2 SENSITIVE Sensitive     CEFAZOLIN <=4 SENSITIVE Sensitive     CEFTRIAXONE <=1 SENSITIVE Sensitive     CIPROFLOXACIN <=0.25 SENSITIVE Sensitive     GENTAMICIN <=1 SENSITIVE Sensitive     IMIPENEM <=0.25 SENSITIVE Sensitive     NITROFURANTOIN <=16 SENSITIVE Sensitive     TRIMETH/SULFA <=20 SENSITIVE Sensitive     AMPICILLIN/SULBACTAM <=2 SENSITIVE Sensitive     PIP/TAZO <=4 SENSITIVE Sensitive     * >=100,000 COLONIES/mL ESCHERICHIA COLI   No symptoms of uti, no abx indicated  ED Provider: Margreta Journey, PA-C  Wynell Balloon 03/08/2016, 8:12 AM Infectious Diseases Pharmacist Phone# [phone]

## 2016-03-08 NOTE — ED Notes (Signed)
Post ED Visit - Positive Culture Follow-up  Culture report reviewed by antimicrobial stewardship pharmacist:  []  Elenor Quinones, Pharm.D. []  Heide Guile, Pharm.D., BCPS []  Parks Neptune, Pharm.D. []  Alycia Rossetti, Pharm.D., BCPS []  [place], Pharm.D., BCPS, AAHIVP []  Legrand Como, Pharm.D., BCPS, AAHIVP []  Milus Glazier, Pharm.D. []  Rob [place], [place].D.  Positive urine culture No further patient follow-up is required at this time, [facility], PA-C  Ardeen Fillers 03/08/2016, 11:41 AM

## 2016-03-11 ENCOUNTER — Encounter: Payer: Self-pay | Admitting: Family

## 2016-03-11 ENCOUNTER — Ambulatory Visit (INDEPENDENT_AMBULATORY_CARE_PROVIDER_SITE_OTHER): Payer: Medicare HMO | Admitting: Family

## 2016-03-11 VITALS — BP 134/80 | HR 90 | Temp 98.3°F | Resp 16 | Ht 65.0 in | Wt 152.0 lb

## 2016-03-11 DIAGNOSIS — J302 Other seasonal allergic rhinitis: Secondary | ICD-10-CM | POA: Diagnosis not present

## 2016-03-11 DIAGNOSIS — M7581 Other shoulder lesions, right shoulder: Secondary | ICD-10-CM

## 2016-03-11 DIAGNOSIS — M7580 Other shoulder lesions, unspecified shoulder: Secondary | ICD-10-CM | POA: Insufficient documentation

## 2016-03-11 MED ORDER — PAROXETINE HCL 10 MG PO TABS: 5.0000 mg | ORAL_TABLET | Freq: Every day | ORAL | Status: DC

## 2016-03-11 MED ORDER — FOLIC ACID 1 MG PO TABS: 1.0000 mg | ORAL_TABLET | Freq: Every day | ORAL | Status: DC

## 2016-03-11 MED ORDER — METHOCARBAMOL 500 MG PO TABS: ORAL_TABLET | ORAL | Status: DC

## 2016-03-11 MED ORDER — MONTELUKAST SODIUM 10 MG PO TABS: 10.0000 mg | ORAL_TABLET | Freq: Every day | ORAL | Status: DC

## 2016-03-11 MED ORDER — DOCUSATE SODIUM 100 MG PO CAPS: ORAL_CAPSULE | ORAL | Status: DC

## 2016-03-11 MED ORDER — ZOLPIDEM TARTRATE 5 MG PO TABS: ORAL_TABLET | ORAL | Status: DC

## 2016-03-11 NOTE — Patient Instructions (Signed)
Thank you for choosing [facility].  Summary/Instructions:   Ice 2-3 times per day as needed.  Tylenol as needed.  Shoulder exercises daily. Limit activities for the next 24 hours.   For your allergies try Claratin, Allegra, Zyrtec, or Xyzal. May also use nasal sprays like Flonase, Nasacort, Rhinocort in addition to your Singulair.   Your prescription(s) have been submitted to your pharmacy or been printed and provided for you. Please take as directed and contact our office if you believe you are having problem(s) with the medication(s) or have any questions.  If your symptoms worsen or fail to improve, please contact our office for further instruction, or in case of emergency go directly to the emergency room at the closest medical facility.   Impingement Syndrome, Rotator Cuff, Bursitis With Rehab Impingement syndrome is a condition that involves inflammation of the tendons of the rotator cuff and the subacromial bursa, that causes pain in the shoulder. The rotator cuff consists of four tendons and muscles that control much of the shoulder and upper arm function. The subacromial bursa is a fluid filled sac that helps reduce friction between the rotator cuff and one of the bones of the shoulder (acromion). Impingement syndrome is usually an overuse injury that causes swelling of the bursa (bursitis), swelling of the tendon (tendonitis), and/or a tear of the tendon (strain). Strains are classified into three categories. Grade 1 strains cause pain, but the tendon is not lengthened. Grade 2 strains include a lengthened ligament, due to the ligament being stretched or partially ruptured. With grade 2 strains there is still function, although the function may be decreased. Grade 3 strains include a complete tear of the tendon or muscle, and function is usually impaired. SYMPTOMS   Pain around the shoulder, often at the outer portion of the upper arm.  Pain that gets worse with shoulder  function, especially when reaching overhead or lifting.  Sometimes, aching when not using the arm.  Pain that wakes you up at night.  Sometimes, tenderness, swelling, warmth, or redness over the affected area.  Loss of strength.  Limited motion of the shoulder, especially reaching behind the back (to the back pocket or to unhook bra) or across your body.  Crackling sound (crepitation) when moving the arm.  Biceps tendon pain and inflammation (in the front of the shoulder). Worse when bending the elbow or lifting. CAUSES  Impingement syndrome is often an overuse injury, in which chronic (repetitive) motions cause the tendons or bursa to become inflamed. A strain occurs when a force is paced on the tendon or muscle that is greater than it can withstand. Common mechanisms of injury include: Stress from sudden increase in duration, frequency, or intensity of training.  Direct hit (trauma) to the shoulder.  Aging, erosion of the tendon with normal use.  Bony bump on shoulder (acromial spur). RISK INCREASES WITH:  Contact sports (football, wrestling, boxing).  Throwing sports (baseball, tennis, volleyball).  Weightlifting and bodybuilding.  Heavy labor.  Previous injury to the rotator cuff, including impingement.  Poor shoulder strength and flexibility.  Failure to warm up properly before activity.  Inadequate protective equipment.  Old age.  Bony bump on shoulder (acromial spur). PREVENTION   Warm up and stretch properly before activity.  Allow for adequate recovery between workouts.  Maintain physical fitness:  Strength, flexibility, and endurance.  Cardiovascular fitness.  Learn and use proper exercise technique. PROGNOSIS  If treated properly, impingement syndrome usually goes away within 6 weeks. Sometimes surgery is  required.  RELATED COMPLICATIONS   Longer healing time if not properly treated, or if not given enough time to heal.  Recurring symptoms,  that result in a chronic condition.  Shoulder stiffness, frozen shoulder, or loss of motion.  Rotator cuff tendon tear.  Recurring symptoms, especially if activity is resumed too soon, with overuse, with a direct blow, or when using poor technique. TREATMENT  Treatment first involves the use of ice and medicine, to reduce pain and inflammation. The use of strengthening and stretching exercises may help reduce pain with activity. These exercises may be performed at home or with a therapist. If non-surgical treatment is unsuccessful after more than 6 months, surgery may be advised. After surgery and rehabilitation, activity is usually possible in 3 months.  MEDICATION  If pain medicine is needed, nonsteroidal anti-inflammatory medicines (aspirin and ibuprofen), or other minor pain relievers (acetaminophen), are often advised.  Do not take pain medicine for 7 days before surgery.  Prescription pain relievers may be given, if your caregiver thinks they are needed. Use only as directed and only as much as you need.  Corticosteroid injections may be given by your caregiver. These injections should be reserved for the most serious cases, because they may only be given a certain number of times. HEAT AND COLD  Cold treatment (icing) should be applied for 10 to 15 minutes every 2 to 3 hours for inflammation and pain, and immediately after activity that aggravates your symptoms. Use ice packs or an ice massage.  Heat treatment may be used before performing stretching and strengthening activities prescribed by your caregiver, physical therapist, or athletic trainer. Use a heat pack or a warm water soak. SEEK MEDICAL CARE IF:   Symptoms get worse or do not improve in 4 to 6 weeks, despite treatment.  New, unexplained symptoms develop. (Drugs used in treatment may produce side effects.) EXERCISES  RANGE OF MOTION (ROM) AND STRETCHING EXERCISES - Impingement Syndrome (Rotator Cuff  Tendinitis,  Bursitis) These exercises may help you when beginning to rehabilitate your injury. Your symptoms may go away with or without further involvement from your physician, physical therapist or athletic trainer. While completing these exercises, remember:   Restoring tissue flexibility helps normal motion to return to the joints. This allows healthier, less painful movement and activity.  An effective stretch should be held for at least 30 seconds.  A stretch should never be painful. You should only feel a gentle lengthening or release in the stretched tissue. STRETCH - Flexion, Standing  Stand with good posture. With an underhand grip on your right / left hand, and an overhand grip on the opposite hand, grasp a broomstick or cane so that your hands are a little more than shoulder width apart.  Keeping your right / left elbow straight and shoulder muscles relaxed, push the stick with your opposite hand, to raise your right / left arm in front of your body and then overhead. Raise your arm until you feel a stretch in your right / left shoulder, but before you have increased shoulder pain.  Try to avoid shrugging your right / left shoulder as your arm rises, by keeping your shoulder blade tucked down and toward your mid-back spine. Hold for __________ seconds.  Slowly return to the starting position. Repeat __________ times. Complete this exercise __________ times per day. STRETCH - Abduction, Supine  Lie on your back. With an underhand grip on your right / left hand and an overhand grip on the opposite hand,  grasp a broomstick or cane so that your hands are a little more than shoulder width apart.  Keeping your right / left elbow straight and your shoulder muscles relaxed, push the stick with your opposite hand, to raise your right / left arm out to the side of your body and then overhead. Raise your arm until you feel a stretch in your right / left shoulder, but before you have increased shoulder  pain.  Try to avoid shrugging your right / left shoulder as your arm rises, by keeping your shoulder blade tucked down and toward your mid-back spine. Hold for __________ seconds.  Slowly return to the starting position. Repeat __________ times. Complete this exercise __________ times per day. ROM - Flexion, Active-Assisted  Lie on your back. You may bend your knees for comfort.  Grasp a broomstick or cane so your hands are about shoulder width apart. Your right / left hand should grip the end of the stick, so that your hand is positioned "thumbs-up," as if you were about to shake hands.  Using your healthy arm to lead, raise your right / left arm overhead, until you feel a gentle stretch in your shoulder. Hold for __________ seconds.  Use the stick to assist in returning your right / left arm to its starting position. Repeat __________ times. Complete this exercise __________ times per day.  ROM - Internal Rotation, Supine   Lie on your back on a firm surface. Place your right / left elbow about 60 degrees away from your side. Elevate your elbow with a folded towel, so that the elbow and shoulder are the same height.  Using a broomstick or cane and your strong arm, pull your right / left hand toward your body until you feel a gentle stretch, but no increase in your shoulder pain. Keep your shoulder and elbow in place throughout the exercise.  Hold for __________ seconds. Slowly return to the starting position. Repeat __________ times. Complete this exercise __________ times per day. STRETCH - Internal Rotation  Place your right / left hand behind your back, palm up.  Throw a towel or belt over your opposite shoulder. Grasp the towel with your right / left hand.  While keeping an upright posture, gently pull up on the towel, until you feel a stretch in the front of your right / left shoulder.  Avoid shrugging your right / left shoulder as your arm rises, by keeping your shoulder blade  tucked down and toward your mid-back spine.  Hold for __________ seconds. Release the stretch, by lowering your healthy hand. Repeat __________ times. Complete this exercise __________ times per day. ROM - Internal Rotation   Using an underhand grip, grasp a stick behind your back with both hands.  While standing upright with good posture, slide the stick up your back until you feel a mild stretch in the front of your shoulder.  Hold for __________ seconds. Slowly return to your starting position. Repeat __________ times. Complete this exercise __________ times per day.  STRETCH - Posterior Shoulder Capsule   Stand or sit with good posture. Grasp your right / left elbow and draw it across your chest, keeping it at the same height as your shoulder.  Pull your elbow, so your upper arm comes in closer to your chest. Pull until you feel a gentle stretch in the back of your shoulder.  Hold for __________ seconds. Repeat __________ times. Complete this exercise __________ times per day. STRENGTHENING EXERCISES - Impingement Syndrome (Rotator  Cuff Tendinitis, Bursitis) These exercises may help you when beginning to rehabilitate your injury. They may resolve your symptoms with or without further involvement from your physician, physical therapist or athletic trainer. While completing these exercises, remember:  Muscles can gain both the endurance and the strength needed for everyday activities through controlled exercises.  Complete these exercises as instructed by your physician, physical therapist or athletic trainer. Increase the resistance and repetitions only as guided.  You may experience muscle soreness or fatigue, but the pain or discomfort you are trying to eliminate should never worsen during these exercises. If this pain does get worse, stop and make sure you are following the directions exactly. If the pain is still present after adjustments, discontinue the exercise until you can  discuss the trouble with your clinician.  During your recovery, avoid activity or exercises which involve actions that place your injured hand or elbow above your head or behind your back or head. These positions stress the tissues which you are trying to heal. STRENGTH - Scapular Depression and Adduction   With good posture, sit on a firm chair. Support your arms in front of you, with pillows, arm rests, or on a table top. Have your elbows in line with the sides of your body.  Gently draw your shoulder blades down and toward your mid-back spine. Gradually increase the tension, without tensing the muscles along the top of your shoulders and the back of your neck.  Hold for __________ seconds. Slowly release the tension and relax your muscles completely before starting the next repetition.  After you have practiced this exercise, remove the arm support and complete the exercise in standing as well as sitting position. Repeat __________ times. Complete this exercise __________ times per day.  STRENGTH - Shoulder Abductors, Isometric  With good posture, stand or sit about 4-6 inches from a wall, with your right / left side facing the wall.  Bend your right / left elbow. Gently press your right / left elbow into the wall. Increase the pressure gradually, until you are pressing as hard as you can, without shrugging your shoulder or increasing any shoulder discomfort.  Hold for __________ seconds.  Release the tension slowly. Relax your shoulder muscles completely before you begin the next repetition. Repeat __________ times. Complete this exercise __________ times per day.  STRENGTH - External Rotators, Isometric  Keep your right / left elbow at your side and bend it 90 degrees.  Step into a door frame so that the outside of your right / left wrist can press against the door frame without your upper arm leaving your side.  Gently press your right / left wrist into the door frame, as if you  were trying to swing the back of your hand away from your stomach. Gradually increase the tension, until you are pressing as hard as you can, without shrugging your shoulder or increasing any shoulder discomfort.  Hold for __________ seconds.  Release the tension slowly. Relax your shoulder muscles completely before you begin the next repetition. Repeat __________ times. Complete this exercise __________ times per day.  STRENGTH - Supraspinatus   Stand or sit with good posture. Grasp a __________ weight, or an exercise band or tubing, so that your hand is "thumbs-up," like you are shaking hands.  Slowly lift your right / left arm in a "V" away from your thigh, diagonally into the space between your side and straight ahead. Lift your hand to shoulder height or as far as you  can, without increasing any shoulder pain. At first, many people do not lift their hands above shoulder height.  Avoid shrugging your right / left shoulder as your arm rises, by keeping your shoulder blade tucked down and toward your mid-back spine.  Hold for __________ seconds. Control the descent of your hand, as you slowly return to your starting position. Repeat __________ times. Complete this exercise __________ times per day.  STRENGTH - External Rotators  Secure a rubber exercise band or tubing to a fixed object (table, pole) so that it is at the same height as your right / left elbow when you are standing or sitting on a firm surface.  Stand or sit so that the secured exercise band is at your uninjured side.  Bend your right / left elbow 90 degrees. Place a folded towel or small pillow under your right / left arm, so that your elbow is a few inches away from your side.  Keeping the tension on the exercise band, pull it away from your body, as if pivoting on your elbow. Be sure to keep your body steady, so that the movement is coming only from your rotating shoulder.  Hold for __________ seconds. Release the  tension in a controlled manner, as you return to the starting position. Repeat __________ times. Complete this exercise __________ times per day.  STRENGTH - Internal Rotators   Secure a rubber exercise band or tubing to a fixed object (table, pole) so that it is at the same height as your right / left elbow when you are standing or sitting on a firm surface.  Stand or sit so that the secured exercise band is at your right / left side.  Bend your elbow 90 degrees. Place a folded towel or small pillow under your right / left arm so that your elbow is a few inches away from your side.  Keeping the tension on the exercise band, pull it across your body, toward your stomach. Be sure to keep your body steady, so that the movement is coming only from your rotating shoulder.  Hold for __________ seconds. Release the tension in a controlled manner, as you return to the starting position. Repeat __________ times. Complete this exercise __________ times per day.  STRENGTH - Scapular Protractors, Standing   Stand arms length away from a wall. Place your hands on the wall, keeping your elbows straight.  Begin by dropping your shoulder blades down and toward your mid-back spine.  To strengthen your protractors, keep your shoulder blades down, but slide them forward on your rib cage. It will feel as if you are lifting the back of your rib cage away from the wall. This is a subtle motion and can be challenging to complete. Ask your caregiver for further instruction, if you are not sure you are doing the exercise correctly.  Hold for __________ seconds. Slowly return to the starting position, resting the muscles completely before starting the next repetition. Repeat __________ times. Complete this exercise __________ times per day. STRENGTH - Scapular Protractors, Supine  Lie on your back on a firm surface. Extend your right / left arm straight into the air while holding a __________ weight in your  hand.  Keeping your head and back in place, lift your shoulder off the floor.  Hold for __________ seconds. Slowly return to the starting position, and allow your muscles to relax completely before starting the next repetition. Repeat __________ times. Complete this exercise __________ times per day. STRENGTH -  Scapular Protractors, Quadruped  Get onto your hands and knees, with your shoulders directly over your hands (or as close as you can be, comfortably).  Keeping your elbows locked, lift the back of your rib cage up into your shoulder blades, so your mid-back rounds out. Keep your neck muscles relaxed.  Hold this position for __________ seconds. Slowly return to the starting position and allow your muscles to relax completely before starting the next repetition. Repeat __________ times. Complete this exercise __________ times per day.  STRENGTH - Scapular Retractors  Secure a rubber exercise band or tubing to a fixed object (table, pole), so that it is at the height of your shoulders when you are either standing, or sitting on a firm armless chair.  With a palm down grip, grasp an end of the band in each hand. Straighten your elbows and lift your hands straight in front of you, at shoulder height. Step back, away from the secured end of the band, until it becomes tense.  Squeezing your shoulder blades together, draw your elbows back toward your sides, as you bend them. Keep your upper arms lifted away from your body throughout the exercise.  Hold for __________ seconds. Slowly ease the tension on the band, as you reverse the directions and return to the starting position. Repeat __________ times. Complete this exercise __________ times per day. STRENGTH - Shoulder Extensors   Secure a rubber exercise band or tubing to a fixed object (table, pole) so that it is at the height of your shoulders when you are either standing, or sitting on a firm armless chair.  With a thumbs-up grip,  grasp an end of the band in each hand. Straighten your elbows and lift your hands straight in front of you, at shoulder height. Step back, away from the secured end of the band, until it becomes tense.  Squeezing your shoulder blades together, pull your hands down to the sides of your thighs. Do not allow your hands to go behind you.  Hold for __________ seconds. Slowly ease the tension on the band, as you reverse the directions and return to the starting position. Repeat __________ times. Complete this exercise __________ times per day.  STRENGTH - Scapular Retractors and External Rotators   Secure a rubber exercise band or tubing to a fixed object (table, pole) so that it is at the height as your shoulders, when you are either standing, or sitting on a firm armless chair.  With a palm down grip, grasp an end of the band in each hand. Bend your elbows 90 degrees and lift your elbows to shoulder height, at your sides. Step back, away from the secured end of the band, until it becomes tense.  Squeezing your shoulder blades together, rotate your shoulders so that your upper arms and elbows remain stationary, but your fists travel upward to head height.  Hold for __________ seconds. Slowly ease the tension on the band, as you reverse the directions and return to the starting position. Repeat __________ times. Complete this exercise __________ times per day.  STRENGTH - Scapular Retractors and External Rotators, Rowing   Secure a rubber exercise band or tubing to a fixed object (table, pole) so that it is at the height of your shoulders, when you are either standing, or sitting on a firm armless chair.  With a palm down grip, grasp an end of the band in each hand. Straighten your elbows and lift your hands straight in front of you, at shoulder  height. Step back, away from the secured end of the band, until it becomes tense.  Step 1: Squeeze your shoulder blades together. Bending your elbows, draw  your hands to your chest, as if you are rowing a boat. At the end of this motion, your hands and elbow should be at shoulder height and your elbows should be out to your sides.  Step 2: Rotate your shoulders, to raise your hands above your head. Your forearms should be vertical and your upper arms should be horizontal.  Hold for __________ seconds. Slowly ease the tension on the band, as you reverse the directions and return to the starting position. Repeat __________ times. Complete this exercise __________ times per day.  STRENGTH - Scapular Depressors  Find a sturdy chair without wheels, such as a dining room chair.  Keeping your feet on the floor, and your hands on the chair arms, lift your bottom up from the seat, and lock your elbows.  Keeping your elbows straight, allow gravity to pull your body weight down. Your shoulders will rise toward your ears.  Raise your body against gravity by drawing your shoulder blades down your back, shortening the distance between your shoulders and ears. Although your feet should always maintain contact with the floor, your feet should progressively support less body weight, as you get stronger.  Hold for __________ seconds. In a controlled and slow manner, lower your body weight to begin the next repetition. Repeat __________ times. Complete this exercise __________ times per day.    This information is not intended to replace advice given to you by your health care provider. Make sure you discuss any questions you have with your health care provider.   Document Released: 10/28/2005 Document Revised: 11/18/2014 Document Reviewed: 02/09/2009 [employer] Interactive Patient Education [employer].

## 2016-03-11 NOTE — Progress Notes (Signed)
Subjective:    Patient ID: [name], female    DOB: [birth year], 70 y.o.   MRN: [id number]  Chief Complaint  Patient presents with  . Shoulder Pain    Right shoulder pain that has been going on for 3 weeks off and on     HPI:  [name] is a 70 y.o. female who  has a past medical history of Seasonal allergies; Hypercholesteremia; GERD (gastroesophageal reflux disease); Arthritis; H/O mumps; H/O measles; Insomnia; Asthma; Allergy; Renal cell carcinoma (2012); Psoriasis (a type of skin inflammation); Alcohol abuse; and Alcohol abuse. and presents today for an office visit.  This is a new problem. Associated symptom of pain located in her right shoulder has been waxing and waning for about 3 weeks. She is right hand dominant. Denies any trauma that she can recall. Pain is described as achy. Symptoms have generally worsened since intitial onset. Aggravated by movement. Denies any modifying factors/treatments in attempt to make the pain better. Functionality is maintained but only slows her down. No numbness or tingling in her upper extremity. Denies previous trauma to either shoulder.   Allergies  Allergen Reactions  . Azithromycin Itching and Swelling     Current Outpatient Prescriptions on File Prior to Visit  Medication Sig Dispense Refill  . albuterol (PROVENTIL HFA;VENTOLIN HFA) 108 (90 Base) MCG/ACT inhaler Inhale 2 puffs into the lungs every 6 (six) hours as needed for wheezing or shortness of breath. 1 Inhaler 4  . DULoxetine (CYMBALTA) 60 MG capsule Take 1 capsule (60 mg total) by mouth daily. 30 capsule 2  . Fluticasone-Salmeterol (ADVAIR) 500-50 MCG/DOSE AEPB Inhale 1 puff into the lungs 2 (two) times daily. 60 each 1  . LORazepam (ATIVAN) 1 MG tablet Take 1 tablet (1 mg total) by mouth 2 (two) times daily as needed (Anxiety, shakiness. DO NOT USE WITH ALCOHOL.). 60 tablet 0  . Magnesium Oxide 400 (240 Mg) MG TABS Take 1 tablet by mouth daily. 30 tablet 3  . Multiple Vitamin  (MULTIVITAMIN WITH MINERALS) TABS tablet Take 1 tablet by mouth daily. 30 tablet 2  . nicotine (NICODERM CQ - DOSED IN MG/24 HOURS) 21 mg/24hr patch Place 1 patch (21 mg total) onto the skin daily. 28 patch 0  . pantoprazole (PROTONIX) 40 MG tablet Take 1 tablet (40 mg total) by mouth daily. 60 tablet 2  . potassium chloride SA (KLOR-CON M20) 20 MEQ tablet Take 1 tablet (20 mEq total) by mouth 2 (two) times daily with a meal. 30 tablet 2  . simvastatin (ZOCOR) 40 MG tablet Take 1 tablet (40 mg total) by mouth every morning. 30 tablet 2  . sucralfate (CARAFATE) 1 g tablet Take 1 tablet (1 g total) by mouth 4 (four) times daily -  with meals and at bedtime. 56 tablet 0  . thiamine 100 MG tablet Take 1 tablet (100 mg total) by mouth daily. 30 tablet 2  . traMADol (ULTRAM) 50 MG tablet Take 1 tablet (50 mg total) by mouth every 6 (six) hours as needed for moderate pain. 120 tablet 0  . triamcinolone cream (KENALOG) 0.1 % Apply topically 2 (two) times daily. 30 g 2   No current facility-administered medications on file prior to visit.     Past Surgical History  Procedure Laterality Date  . Kidney surgery  12/2010    [facility]; partial nephrectomy  . Hernia repair  [id number]    supraumbilical repair  . Bunionectomy  04/2011  . Menisectomy  2010  left knee  . Cholecystectomy  11/13/2011    Procedure: LAPAROSCOPIC CHOLECYSTECTOMY WITH INTRAOPERATIVE CHOLANGIOGRAM;  Surgeon: Judieth Keens, DO;  Location: [facility];  Service: General;  Laterality: N/A;  . Esophagogastroduodenoscopy N/A 09/25/2013    Procedure: ESOPHAGOGASTRODUODENOSCOPY (EGD);  Surgeon: Lear Ng, MD;  Location: [facility];  Service: Endoscopy;  Laterality: N/A;  . Colonoscopy N/A 09/25/2013    Procedure: COLONOSCOPY;  Surgeon: Lear Ng, MD;  Location: [facility];  Service: Endoscopy;  Laterality: N/A;  . Abdominal hysterectomy  40years ago  . Tubal ligation  44 years ago  . Lumbar laminectomy/decompression  microdiscectomy Right 10/12/2014    Procedure: HEMI LAMINECTOMY MICRODISCECTOMY L5-S1 RIGHT (1 LEVEL);  Surgeon: Tobi Bastos, MD;  Location: [facility];  Service: Orthopedics;  Laterality: Right;  . Esophagogastroduodenoscopy (egd) with propofol Left 01/03/2016    Procedure: ESOPHAGOGASTRODUODENOSCOPY (EGD) WITH PROPOFOL;  Surgeon: Arta Silence, MD;  Location: [facility];  Service: Endoscopy;  Laterality: Left;   Past Medical History  Diagnosis Date  . Seasonal allergies   . Hypercholesteremia     under control  . GERD (gastroesophageal reflux disease)     occasional  . Arthritis     back-severe, hips, right knee  . H/O mumps   . H/O measles   . Insomnia   . Asthma   . Allergy   . Renal cell carcinoma 2012    left  . Psoriasis (a type of skin inflammation)   . Alcohol abuse   . Alcohol abuse     Review of Systems  Constitutional: Negative for fever and chills.  Musculoskeletal:       Positive for right shoulder pain  Neurological: Negative for weakness and numbness.      Objective:    BP 134/80 mmHg  Pulse 90  Temp(Src) 98.3 F (36.8 C) (Oral)  Resp 16  Ht 5\' 5"  (1.651 m)  Wt 152 lb (68.947 kg)  BMI 25.29 kg/m2  SpO2 99% Nursing note and vital signs reviewed.  Physical Exam  Constitutional: She is oriented to person, place, and time. She appears well-developed and well-nourished. No distress.  Cardiovascular: Normal rate, regular rhythm, normal heart sounds and intact distal pulses.   Pulmonary/Chest: Effort normal and breath sounds normal.  Musculoskeletal:  Right shoulder - no obvious deformity, discoloration, or edema noted. Tenderness elicited over subacromial space and posterior glenohumeral joint. Range of motion is within normal limits compared to the contralateral side. Strength is 4+. Distal pulses and sensation are intact and appropriate. Negative Michel Bickers; negative Neer's impingement; negative apprehension; discomfort with empty can with no  weakness.  Neurological: She is alert and oriented to person, place, and time.  Skin: Skin is warm and dry.  Psychiatric: She has a normal mood and affect. Her behavior is normal. Judgment and thought content normal.    Procedure: Right Shoulder Injection  Description: Informed consent was obtained with discussion including risks and benefits of the procedure. Patient verbally wished to continued. Time out was performed. The posterior right shoulder was marked and identified. It was cleansed with betadine using a concentric circular pattern. Skin anesthesia was applied using cold spray applied for 10 seconds. Injection of 1:4 of Kenalog to Sensoricaine was injected following aspiration with no return noted. Following the injection there was instant relief confirming appropriate placement. A bandage was applied to the area and post care instructions were provided. The procedure was tolerated well with no complications.      Assessment & Plan:  Problem List Items Addressed This Visit      Respiratory   Seasonal allergies   Relevant Medications   montelukast (SINGULAIR) 10 MG tablet     Musculoskeletal and Integument   Rotator cuff tendinitis - Primary    Symptoms and exam consistent with rotator cuff tendinitis. Given previous history of ulcers and inability to take anti-inflammatories, injection of cortisone was provided without complication. Continue home exercise therapy, ice, and over-the-counter Tylenol as needed for discomfort. Follow-up if symptoms worsen or do not improve.      Relevant Medications   methocarbamol (ROBAXIN) 500 MG tablet       I have changed [name]'s [employer] STOOL SOFTENER to docusate sodium. I am also having her maintain her potassium chloride SA, multivitamin with minerals, pantoprazole, thiamine, triamcinolone cream, nicotine, albuterol, DULoxetine, Fluticasone-Salmeterol, Magnesium Oxide, simvastatin, traMADol, LORazepam, sucralfate, montelukast, methocarbamol,  PARoxetine, folic acid, and zolpidem.   Meds ordered this encounter  Medications  . montelukast (SINGULAIR) 10 MG tablet    Sig: Take 1 tablet (10 mg total) by mouth at bedtime.    Dispense:  30 tablet    Refill:  3  . methocarbamol (ROBAXIN) 500 MG tablet    Sig: TAKE 1 TABLET BY MOUTH 3 TIMES A DAY AS NEEDED FOR MUSCLE SPASM    Dispense:  90 tablet    Refill:  1  . PARoxetine (PAXIL) 10 MG tablet    Sig: Take 0.5 tablets (5 mg total) by mouth daily.    Dispense:  30 tablet    Refill:  3  . folic acid (FOLVITE) 1 MG tablet    Sig: Take 1 tablet (1 mg total) by mouth daily.    Dispense:  30 tablet    Refill:  2  . docusate sodium ([employer] STOOL SOFTENER) 100 MG capsule    Sig: TAKE 1-2 CAPSULES BY MOUTH DAILY AS NEEDED FOR MILD CONSTIPATION.    Dispense:  60 capsule    Refill:  0  . zolpidem (AMBIEN) 5 MG tablet    Sig: TAKE 1 TABLET BY MOUTH EVERY DAY AT BEDTIME AS NEEDED FOR SLEEP    Dispense:  30 tablet    Refill:  1    Not to exceed 4 additional fills before 04/22/2016.     Follow-up: Return if symptoms worsen or fail to improve.  [name], FNP

## 2016-03-11 NOTE — Progress Notes (Signed)
Pre visit review using our clinic review tool, if applicable. No additional management support is needed unless otherwise documented below in the visit note. 

## 2016-03-11 NOTE — Assessment & Plan Note (Signed)
Symptoms and exam consistent with rotator cuff tendinitis. Given previous history of ulcers and inability to take anti-inflammatories, injection of cortisone was provided without complication. Continue home exercise therapy, ice, and over-the-counter Tylenol as needed for discomfort. Follow-up if symptoms worsen or do not improve.

## 2016-03-28 ENCOUNTER — Telehealth: Payer: Self-pay

## 2016-03-28 NOTE — Telephone Encounter (Signed)
Patient called and wanted her ambien refilled 10days earlier. She states she dropped them in the toilet the other day and lost 10pills. Please follow up.

## 2016-03-28 NOTE — Telephone Encounter (Signed)
Please advise 

## 2016-03-28 NOTE — Telephone Encounter (Signed)
Will discuss with patient at upcoming appointment.

## 2016-03-29 ENCOUNTER — Ambulatory Visit: Payer: Medicare HMO | Admitting: Family

## 2016-03-29 DIAGNOSIS — Z0289 Encounter for other administrative examinations: Secondary | ICD-10-CM

## 2016-04-05 ENCOUNTER — Encounter (HOSPITAL_COMMUNITY): Payer: Self-pay | Admitting: Internal Medicine

## 2016-04-05 ENCOUNTER — Ambulatory Visit: Payer: Medicare HMO | Admitting: Family

## 2016-04-05 ENCOUNTER — Inpatient Hospital Stay (HOSPITAL_COMMUNITY): Payer: Medicare HMO

## 2016-04-05 ENCOUNTER — Inpatient Hospital Stay (HOSPITAL_COMMUNITY)
Admission: EM | Admit: 2016-04-05 | Discharge: 2016-04-10 | DRG: 811 | Disposition: A | Discharge status: Home / Self Care | Payer: Medicare HMO | Attending: Internal Medicine | Admitting: Internal Medicine

## 2016-04-05 DIAGNOSIS — E872 Acidosis: Secondary | ICD-10-CM | POA: Diagnosis present

## 2016-04-05 DIAGNOSIS — Z79899 Other long term (current) drug therapy: Secondary | ICD-10-CM

## 2016-04-05 DIAGNOSIS — R1013 Epigastric pain: Secondary | ICD-10-CM | POA: Diagnosis not present

## 2016-04-05 DIAGNOSIS — Z9049 Acquired absence of other specified parts of digestive tract: Secondary | ICD-10-CM

## 2016-04-05 DIAGNOSIS — B373 Candidiasis of vulva and vagina: Secondary | ICD-10-CM | POA: Diagnosis present

## 2016-04-05 DIAGNOSIS — Z8249 Family history of ischemic heart disease and other diseases of the circulatory system: Secondary | ICD-10-CM

## 2016-04-05 DIAGNOSIS — K7030 Alcoholic cirrhosis of liver without ascites: Secondary | ICD-10-CM | POA: Diagnosis present

## 2016-04-05 DIAGNOSIS — J45909 Unspecified asthma, uncomplicated: Secondary | ICD-10-CM | POA: Diagnosis present

## 2016-04-05 DIAGNOSIS — J4530 Mild persistent asthma, uncomplicated: Secondary | ICD-10-CM

## 2016-04-05 DIAGNOSIS — F1010 Alcohol abuse, uncomplicated: Secondary | ICD-10-CM | POA: Diagnosis not present

## 2016-04-05 DIAGNOSIS — Z905 Acquired absence of kidney: Secondary | ICD-10-CM | POA: Diagnosis not present

## 2016-04-05 DIAGNOSIS — K254 Chronic or unspecified gastric ulcer with hemorrhage: Secondary | ICD-10-CM | POA: Diagnosis present

## 2016-04-05 DIAGNOSIS — G8929 Other chronic pain: Secondary | ICD-10-CM | POA: Diagnosis present

## 2016-04-05 DIAGNOSIS — I10 Essential (primary) hypertension: Secondary | ICD-10-CM | POA: Diagnosis present

## 2016-04-05 DIAGNOSIS — Z85528 Personal history of other malignant neoplasm of kidney: Secondary | ICD-10-CM

## 2016-04-05 DIAGNOSIS — K922 Gastrointestinal hemorrhage, unspecified: Secondary | ICD-10-CM | POA: Diagnosis not present

## 2016-04-05 DIAGNOSIS — D72829 Elevated white blood cell count, unspecified: Secondary | ICD-10-CM | POA: Diagnosis present

## 2016-04-05 DIAGNOSIS — K766 Portal hypertension: Secondary | ICD-10-CM | POA: Diagnosis present

## 2016-04-05 DIAGNOSIS — E785 Hyperlipidemia, unspecified: Secondary | ICD-10-CM | POA: Diagnosis not present

## 2016-04-05 DIAGNOSIS — F17210 Nicotine dependence, cigarettes, uncomplicated: Secondary | ICD-10-CM | POA: Diagnosis present

## 2016-04-05 DIAGNOSIS — I8500 Esophageal varices without bleeding: Secondary | ICD-10-CM | POA: Diagnosis present

## 2016-04-05 DIAGNOSIS — D62 Acute posthemorrhagic anemia: Secondary | ICD-10-CM | POA: Diagnosis present

## 2016-04-05 DIAGNOSIS — K3189 Other diseases of stomach and duodenum: Secondary | ICD-10-CM | POA: Diagnosis present

## 2016-04-05 DIAGNOSIS — Z90710 Acquired absence of both cervix and uterus: Secondary | ICD-10-CM | POA: Diagnosis not present

## 2016-04-05 DIAGNOSIS — M549 Dorsalgia, unspecified: Secondary | ICD-10-CM | POA: Diagnosis present

## 2016-04-05 DIAGNOSIS — K920 Hematemesis: Secondary | ICD-10-CM | POA: Diagnosis present

## 2016-04-05 DIAGNOSIS — K259 Gastric ulcer, unspecified as acute or chronic, without hemorrhage or perforation: Secondary | ICD-10-CM | POA: Diagnosis not present

## 2016-04-05 DIAGNOSIS — R000 Tachycardia, unspecified: Secondary | ICD-10-CM | POA: Diagnosis present

## 2016-04-05 DIAGNOSIS — Z0289 Encounter for other administrative examinations: Secondary | ICD-10-CM

## 2016-04-05 DIAGNOSIS — R109 Unspecified abdominal pain: Secondary | ICD-10-CM | POA: Diagnosis present

## 2016-04-05 LAB — COMPREHENSIVE METABOLIC PANEL
ALT: 24 U/L (ref 14–54)
AST: 45 U/L — ABNORMAL HIGH (ref 15–41)
Albumin: 2.8 g/dL — ABNORMAL LOW (ref 3.5–5.0)
Alkaline Phosphatase: 154 U/L — ABNORMAL HIGH (ref 38–126)
Anion gap: 12 (ref 5–15)
BUN: 59 mg/dL — ABNORMAL HIGH (ref 6–20)
CO2: 20 mmol/L — ABNORMAL LOW (ref 22–32)
Calcium: 8.5 mg/dL — ABNORMAL LOW (ref 8.9–10.3)
Chloride: 105 mmol/L (ref 101–111)
Creatinine, Ser: 0.81 mg/dL (ref 0.44–1.00)
GFR calc Af Amer: >60 mL/min (ref >60)
GFR calc non Af Amer: >60 mL/min (ref >60)
Glucose, Bld: 139 mg/dL — ABNORMAL HIGH (ref 65–99)
Potassium: 3.9 mmol/L (ref 3.5–5.1)
Sodium: 137 mmol/L (ref 135–145)
Total Bilirubin: 0.6 mg/dL (ref 0.3–1.2)
Total Protein: 5.8 g/dL — ABNORMAL LOW (ref 6.5–8.1)

## 2016-04-05 LAB — CBC WITH DIFFERENTIAL/PLATELET
BASOS ABS: 0 10*3/uL (ref 0.0–0.1)
Basophils Relative: 0 %
Eosinophils Absolute: 0 10*3/uL (ref 0.0–0.7)
Eosinophils Relative: 0 %
HEMATOCRIT: 19.8 % — AB (ref 36.0–46.0)
Hemoglobin: 6.4 g/dL — CL (ref 12.0–15.0)
LYMPHS PCT: 28 %
Lymphs Abs: 3.3 10*3/uL (ref 0.7–4.0)
MCH: 27.6 pg (ref 26.0–34.0)
MCHC: 32.3 g/dL (ref 30.0–36.0)
MCV: 85.3 fL (ref 78.0–100.0)
Monocytes Absolute: 0.9 10*3/uL (ref 0.1–1.0)
Monocytes Relative: 7 %
NEUTROS ABS: 7.5 10*3/uL (ref 1.7–7.7)
Neutrophils Relative %: 64 %
PLATELETS: 359 10*3/uL (ref 150–400)
RBC: 2.32 MIL/uL — AB (ref 3.87–5.11)
RDW: 17.7 % — ABNORMAL HIGH (ref 11.5–15.5)
WBC: 11.6 10*3/uL — ABNORMAL HIGH (ref 4.0–10.5)

## 2016-04-05 LAB — PREPARE RBC (CROSSMATCH)

## 2016-04-05 LAB — POC OCCULT BLOOD, ED: Fecal Occult Bld: POSITIVE — AB

## 2016-04-05 LAB — PROTIME-INR
INR: 1.29 (ref 0.00–1.49)
Prothrombin Time: 16.2 seconds — ABNORMAL HIGH (ref 11.6–15.2)

## 2016-04-05 MED ORDER — HYDROMORPHONE HCL 1 MG/ML IJ SOLN
0.5000 mg | Freq: Once | INTRAMUSCULAR | Status: AC
  Administered 2016-04-05: 0.5 mg via INTRAVENOUS
  Filled 2016-04-05: qty 1

## 2016-04-05 MED ORDER — SODIUM CHLORIDE 0.9 % IV SOLN
8.0000 mg/h | INTRAVENOUS | Status: DC
  Administered 2016-04-05 – 2016-04-07 (×2): 8 mg/h via INTRAVENOUS
  Filled 2016-04-05: qty 40
  Filled 2016-04-05 (×7): qty 80

## 2016-04-05 MED ORDER — SODIUM CHLORIDE 0.9 % IV SOLN
80.0000 mg | Freq: Once | INTRAVENOUS | Status: AC
  Administered 2016-04-05: 80 mg via INTRAVENOUS
  Filled 2016-04-05: qty 80

## 2016-04-05 MED ORDER — SODIUM CHLORIDE 0.9 % IV SOLN
Freq: Once | INTRAVENOUS | Status: AC
  Administered 2016-04-06: via INTRAVENOUS

## 2016-04-05 MED ORDER — ONDANSETRON HCL 4 MG/2ML IJ SOLN
4.0000 mg | Freq: Once | INTRAMUSCULAR | Status: AC
  Administered 2016-04-05: 4 mg via INTRAVENOUS
  Filled 2016-04-05: qty 2

## 2016-04-05 MED ORDER — SODIUM CHLORIDE 0.9 % IV BOLUS (SEPSIS)
500.0000 mL | Freq: Once | INTRAVENOUS | Status: AC
  Administered 2016-04-05: 500 mL via INTRAVENOUS

## 2016-04-05 NOTE — ED Notes (Signed)
Bed: [id number] Expected date:  Expected time:  Means of arrival:  Comments: EMS 69yo F GI bleeding

## 2016-04-05 NOTE — ED Notes (Signed)
Per EMS pt with hx of stomach ulcers reports drinking alcohol and c/o vomiting dark red blood since 1700 today. Pt arrived alert and oriented x4 and in no acute distress.

## 2016-04-05 NOTE — ED Provider Notes (Signed)
CSN: [id number]     Arrival date & time 04/05/16  2130 History   First MD Initiated Contact with Patient 04/05/16 2139     Chief Complaint  Patient presents with  . GI Bleeding     (Consider location/radiation/quality/duration/timing/severity/associated sxs/prior Treatment) HPI Comments: Patient with history of upper GI bleeding requiring transfusion, suspected due to esophagitis and gastric ulcers noted on upper endoscopy 2/17, on Protonix, no anticoagulation, no NSAIDs, occasional alcohol use last use yesterday -- presents with complaint of hematemesis starting at 5 PM. Patient noticed melena this morning. She does have epigastric abdominal pain. No urinary symptoms. No chest pain or fever. No lightheadedness, fatigue, or syncope. The onset of this condition was acute. The course is constant. Aggravating factors: none. Alleviating factors: none.    The history is provided by the patient and medical records.    Past Medical History  Diagnosis Date  . Seasonal allergies   . Hypercholesteremia     under control  . GERD (gastroesophageal reflux disease)     occasional  . Arthritis     back-severe, hips, right knee  . H/O mumps   . H/O measles   . Insomnia   . Asthma   . Allergy   . Renal cell carcinoma 2012    left  . Psoriasis (a type of skin inflammation)   . Alcohol abuse   . Alcohol abuse    Past Surgical History  Procedure Laterality Date  . Kidney surgery  12/2010    [facility]; partial nephrectomy  . Hernia repair  [id number]    supraumbilical repair  . Bunionectomy  04/2011  . Menisectomy  2010    left knee  . Cholecystectomy  11/13/2011    Procedure: LAPAROSCOPIC CHOLECYSTECTOMY WITH INTRAOPERATIVE CHOLANGIOGRAM;  Surgeon: Judieth Keens, DO;  Location: [facility];  Service: General;  Laterality: N/A;  . Esophagogastroduodenoscopy N/A 09/25/2013    Procedure: ESOPHAGOGASTRODUODENOSCOPY (EGD);  Surgeon: Lear Ng, MD;  Location: [facility];  Service: Endoscopy;   Laterality: N/A;  . Colonoscopy N/A 09/25/2013    Procedure: COLONOSCOPY;  Surgeon: Lear Ng, MD;  Location: [facility];  Service: Endoscopy;  Laterality: N/A;  . Abdominal hysterectomy  40years ago  . Tubal ligation  44 years ago  . Lumbar laminectomy/decompression microdiscectomy Right 10/12/2014    Procedure: HEMI LAMINECTOMY MICRODISCECTOMY L5-S1 RIGHT (1 LEVEL);  Surgeon: Tobi Bastos, MD;  Location: [facility];  Service: Orthopedics;  Laterality: Right;  . Esophagogastroduodenoscopy (egd) with propofol Left 01/03/2016    Procedure: ESOPHAGOGASTRODUODENOSCOPY (EGD) WITH PROPOFOL;  Surgeon: Arta Silence, MD;  Location: [facility];  Service: Endoscopy;  Laterality: Left;   Family History  Problem Relation Age of Onset  . Hyperlipidemia Mother   . Hypertension Mother    Social History  Substance Use Topics  . Smoking status: Current Some Day Smoker -- 0.15 packs/day for 6 years    Types: Cigarettes  . Smokeless tobacco: Never Used  . Alcohol Use: 0.0 oz/week    0 Standard drinks or equivalent per week     Comment: socially   OB History    Gravida Para Term Preterm AB TAB SAB Ectopic Multiple Living   3 3        3      Review of Systems  Constitutional: Negative for fever.  HENT: Negative for rhinorrhea and sore throat.   Eyes: Negative for redness.  Respiratory: Negative for cough.   Cardiovascular: Negative for chest pain.  Gastrointestinal: Positive for abdominal pain  and blood in stool. Negative for nausea, vomiting and diarrhea.       + hematemesis  Genitourinary: Negative for dysuria.  Musculoskeletal: Negative for myalgias.  Skin: Negative for rash.  Neurological: Negative for headaches.      Allergies  Azithromycin  Home Medications   Prior to Admission medications   Medication Sig Start Date End Date Taking? Authorizing Provider  albuterol (PROVENTIL HFA;VENTOLIN HFA) 108 (90 Base) MCG/ACT inhaler Inhale 2 puffs into the lungs every 6 (six)  hours as needed for wheezing or shortness of breath. 01/19/16   Golden Circle, FNP  docusate sodium ([employer] STOOL SOFTENER) 100 MG capsule TAKE 1-2 CAPSULES BY MOUTH DAILY AS NEEDED FOR MILD CONSTIPATION. 03/11/16   Golden Circle, FNP  DULoxetine (CYMBALTA) 60 MG capsule Take 1 capsule (60 mg total) by mouth daily. 01/19/16   Golden Circle, FNP  Fluticasone-Salmeterol (ADVAIR) 500-50 MCG/DOSE AEPB Inhale 1 puff into the lungs 2 (two) times daily. 01/19/16   Golden Circle, FNP  folic acid (FOLVITE) 1 MG tablet Take 1 tablet (1 mg total) by mouth daily. 03/11/16   Golden Circle, FNP  LORazepam (ATIVAN) 1 MG tablet Take 1 tablet (1 mg total) by mouth 2 (two) times daily as needed (Anxiety, shakiness. DO NOT USE WITH ALCOHOL.). 01/19/16   Golden Circle, FNP  Magnesium Oxide 400 (240 Mg) MG TABS Take 1 tablet by mouth daily. 01/19/16   Golden Circle, FNP  methocarbamol (ROBAXIN) 500 MG tablet TAKE 1 TABLET BY MOUTH 3 TIMES A DAY AS NEEDED FOR MUSCLE SPASM 03/11/16   Golden Circle, FNP  montelukast (SINGULAIR) 10 MG tablet Take 1 tablet (10 mg total) by mouth at bedtime. 03/11/16   Golden Circle, FNP  Multiple Vitamin (MULTIVITAMIN WITH MINERALS) TABS tablet Take 1 tablet by mouth daily. 01/05/16   Venetia Maxon Rama, MD  nicotine (NICODERM CQ - DOSED IN MG/24 HOURS) 21 mg/24hr patch Place 1 patch (21 mg total) onto the skin daily. 01/05/16   Christina P Rama, MD  pantoprazole (PROTONIX) 40 MG tablet Take 1 tablet (40 mg total) by mouth daily. 01/05/16   Venetia Maxon Rama, MD  PARoxetine (PAXIL) 10 MG tablet Take 0.5 tablets (5 mg total) by mouth daily. 03/11/16   Golden Circle, FNP  potassium chloride SA (KLOR-CON M20) 20 MEQ tablet Take 1 tablet (20 mEq total) by mouth 2 (two) times daily with a meal. 10/25/15   Golden Circle, FNP  simvastatin (ZOCOR) 40 MG tablet Take 1 tablet (40 mg total) by mouth every morning. 01/19/16   Golden Circle, FNP  sucralfate (CARAFATE) 1 g tablet Take 1 tablet (1  g total) by mouth 4 (four) times daily -  with meals and at bedtime. 03/05/16   Sharlett Iles, MD  thiamine 100 MG tablet Take 1 tablet (100 mg total) by mouth daily. 01/05/16   Venetia Maxon Rama, MD  traMADol (ULTRAM) 50 MG tablet Take 1 tablet (50 mg total) by mouth every 6 (six) hours as needed for moderate pain. 01/19/16   Golden Circle, FNP  triamcinolone cream (KENALOG) 0.1 % Apply topically 2 (two) times daily. 01/05/16   Christina P Rama, MD  zolpidem (AMBIEN) 5 MG tablet TAKE 1 TABLET BY MOUTH EVERY DAY AT BEDTIME AS NEEDED FOR SLEEP 03/11/16   Golden Circle, FNP   BP 101/58 mmHg  Pulse 104  Temp(Src) 98 F (36.7 C) (Oral)  Resp 18  Ht 5'  5" (1.651 m)  Wt 68.04 kg  BMI 24.96 kg/m2  SpO2 100%   Physical Exam  Constitutional: She appears well-developed and well-nourished.  HENT:  Head: Normocephalic and atraumatic.  Eyes: Conjunctivae are normal. Right eye exhibits no discharge. Left eye exhibits no discharge.  Neck: Normal range of motion. Neck supple.  Cardiovascular: Regular rhythm and normal heart sounds.  Tachycardia present.   No murmur heard. Pulmonary/Chest: Effort normal and breath sounds normal.  Abdominal: Soft. Bowel sounds are normal. She exhibits no distension. There is tenderness. There is no rebound and no guarding.  Mild epigastric pain  Musculoskeletal: She exhibits no edema or tenderness.  Neurological: She is alert.  Skin: Skin is warm and dry.  Psychiatric: She has a normal mood and affect.  Nursing note and vitals reviewed.   ED Course  Procedures (including critical care time) Labs Review Labs Reviewed  CBC WITH DIFFERENTIAL/PLATELET - Abnormal; Notable for the following:    WBC 11.6 (*)    RBC 2.32 (*)    Hemoglobin 6.4 (*)    HCT 19.8 (*)    RDW 17.7 (*)    All other components within normal limits  COMPREHENSIVE METABOLIC PANEL - Abnormal; Notable for the following:    CO2 20 (*)    Glucose, Bld 139 (*)    BUN 59 (*)    Calcium  8.5 (*)    Total Protein 5.8 (*)    Albumin 2.8 (*)    AST 45 (*)    Alkaline Phosphatase 154 (*)    All other components within normal limits  PROTIME-INR - Abnormal; Notable for the following:    Prothrombin Time 16.2 (*)    All other components within normal limits  POC OCCULT BLOOD, ED - Abnormal; Notable for the following:    Fecal Occult Bld POSITIVE (*)    All other components within normal limits  OCCULT BLOOD X 1 CARD TO LAB, STOOL  TYPE AND SCREEN  PREPARE RBC (CROSSMATCH)      10:02 PM Patient seen and examined. Rectal performed with RN chaperone, + melena. Work-up initiated. Medications ordered.   Vital signs reviewed and are as follows: BP 101/58 mmHg  Pulse 104  Temp(Src) 98 F (36.7 C) (Oral)  Resp 18  Ht 5\' 5"  (1.651 m)  Wt 68.04 kg  BMI 24.96 kg/m2  SpO2 100%  11:17 PM Spoke with Dr. Cristina Gong -- [facility] to see in morning.   Spoke with Dr. Roel Cluck who will admit. Will admit to stepdown. Protonix drip ordered.   CRITICAL CARE Performed by: Faustino Congress Total critical care time: 35 minutes Critical care time was exclusive of separately billable procedures and treating other patients. Critical care was necessary to treat or prevent imminent or life-threatening deterioration. Critical care was time spent personally by me on the following activities: development of treatment plan with patient and/or surrogate as well as nursing, discussions with consultants, evaluation of patient's response to treatment, examination of patient, obtaining history from patient or surrogate, ordering and performing treatments and interventions, ordering and review of laboratory studies, ordering and review of radiographic studies, pulse oximetry and re-evaluation of patient's condition.  MDM   Final diagnoses:  Upper GI bleeding  Anemia due to blood loss, acute   Admit to stepdown.    Carlisle Cater, PA-C 04/05/16 [id number]  Malvin Johns, MD 04/05/16 [phone]

## 2016-04-05 NOTE — H&P (Signed)
[name] [id number] DOB: June 28, 1946 DOA: 04/05/2016    PCP: [name], FNP   Outpatient Specialists: Howie Ill Outlaw Patient coming from: home  Chief Complaint: Vomiting blood  HPI: [name] is a 69 y.o. female with medical history significant of alcohol abuse and upper GI bleed in the past, chronic back pain and asthma    Presented with dark blood and vomiting since 5 PM today have had at least 4 episodes so far. At first she had severe epigastric abdominal pain radiating to her back. He reports melena for the past 24 hours. Nice using NSAIDs not on any anticoagulation. Although she has decreased alcohol intake lately but yesterday did drink. That emesis associated with epigastric pain. Nothing seems to make this better. She already has been on Protonix.  Chest pain no fever no lightheadedness no syncope no shortness of breath   Regarding pertinent Chronic problems: She and head rotator cuff tendinitis but was not prescribed NSAIDs instead was given a cortisone injection Patient had admission for significant hematemesis requiring blood transfusion in February 2017 Dr. Tyler Pita seen patient in consult and performed EGD showed large gastric also which were not bleeding and friable GE junction thought to be most likely source of bleeding Patient has history of alcohol abuse with reports has been drinking less  IN ER: Tachycardia To 105 blood pressure 103/73 WBC 11.6 hemoglobin 6.4 platelets 359 BUN 59 with creatinine 0.81 INR 1.29  Hemoccult positive [facility] has been consulted Started on protonic strip up to 80 mg bolus She was given 500 mL normal saline bolus and  typed & screened for blood administration  Hospitalist was called for admission for suspected upper GI bleed with acute blood loss anemia requiring blood transfusion  Review of Systems:    Pertinent positives include:  abdominal pain, nausea, vomiting, melena, hematemesis  Constitutional:  No  weight loss, night sweats, Fevers, chills, fatigue, weight loss  HEENT:  No headaches, Difficulty swallowing,Tooth/dental problems,Sore throat,  No sneezing, itching, ear ache, nasal congestion, post nasal drip,  Cardio-vascular:  No chest pain, Orthopnea, PND, anasarca, dizziness, palpitations.no Bilateral lower extremity swelling  GI:  No heartburn, indigestion, diarrhea, change in bowel habits, loss of appetite, blood in stool,  Resp:  no shortness of breath at rest. No dyspnea on exertion, No excess mucus, no productive cough, No non-productive cough, No coughing up of blood.No change in color of mucus.No wheezing. Skin:  no rash or lesions. No jaundice GU:  no dysuria, change in color of urine, no urgency or frequency. No straining to urinate.  No flank pain.  Musculoskeletal:  No joint pain or no joint swelling. No decreased range of motion. No back pain.  Psych:  No change in mood or affect. No depression or anxiety. No memory loss.  Neuro: no localizing neurological complaints, no tingling, no weakness, no double vision, no gait abnormality, no slurred speech, no confusion  As per HPI otherwise 10 point review of systems negative.   Past Medical History: Past Medical History  Diagnosis Date  . Seasonal allergies   . Hypercholesteremia     under control  . GERD (gastroesophageal reflux disease)     occasional  . Arthritis     back-severe, hips, right knee  . H/O mumps   . H/O measles   . Insomnia   . Asthma   . Allergy   . Renal cell carcinoma 2012    left  . Psoriasis (a type of skin  inflammation)   . Alcohol abuse   . Alcohol abuse    Past Surgical History  Procedure Laterality Date  . Kidney surgery  12/2010    [facility]; partial nephrectomy  . Hernia repair  [id number]    supraumbilical repair  . Bunionectomy  04/2011  . Menisectomy  2010    left knee  . Cholecystectomy  11/13/2011    Procedure: LAPAROSCOPIC CHOLECYSTECTOMY WITH INTRAOPERATIVE CHOLANGIOGRAM;   Surgeon: Judieth Keens, DO;  Location: [facility];  Service: General;  Laterality: N/A;  . Esophagogastroduodenoscopy N/A 09/25/2013    Procedure: ESOPHAGOGASTRODUODENOSCOPY (EGD);  Surgeon: Lear Ng, MD;  Location: [facility];  Service: Endoscopy;  Laterality: N/A;  . Colonoscopy N/A 09/25/2013    Procedure: COLONOSCOPY;  Surgeon: Lear Ng, MD;  Location: [facility];  Service: Endoscopy;  Laterality: N/A;  . Abdominal hysterectomy  40years ago  . Tubal ligation  44 years ago  . Lumbar laminectomy/decompression microdiscectomy Right 10/12/2014    Procedure: HEMI LAMINECTOMY MICRODISCECTOMY L5-S1 RIGHT (1 LEVEL);  Surgeon: Tobi Bastos, MD;  Location: [facility];  Service: Orthopedics;  Laterality: Right;  . Esophagogastroduodenoscopy (egd) with propofol Left 01/03/2016    Procedure: ESOPHAGOGASTRODUODENOSCOPY (EGD) WITH PROPOFOL;  Surgeon: Arta Silence, MD;  Location: [facility];  Service: Endoscopy;  Laterality: Left;     Social History:  Ambulatory   Independently  Lives at home alone,         reports that she has been smoking Cigarettes.  She has a .9 pack-year smoking history. She has never used smokeless tobacco. She reports that she drinks alcohol. She reports that she does not use illicit drugs.  Allergies:   Allergies  Allergen Reactions  . Azithromycin Itching and Swelling       Family History:    Family History  Problem Relation Age of Onset  . Hyperlipidemia Mother   . Hypertension Mother     Medications: Prior to Admission medications   Medication Sig Start Date End Date Taking? Authorizing Provider  albuterol (PROVENTIL HFA;VENTOLIN HFA) 108 (90 Base) MCG/ACT inhaler Inhale 2 puffs into the lungs every 6 (six) hours as needed for wheezing or shortness of breath. 01/19/16  Yes Golden Circle, FNP  docusate sodium ([employer] STOOL SOFTENER) 100 MG capsule TAKE 1-2 CAPSULES BY MOUTH DAILY AS NEEDED FOR MILD CONSTIPATION. Patient taking  differently: Take 100-200 mg by mouth daily as needed for mild constipation. TAKE 1-2 CAPSULES BY MOUTH DAILY AS NEEDED FOR MILD CONSTIPATION. 03/11/16  Yes Golden Circle, FNP  DULoxetine (CYMBALTA) 60 MG capsule Take 1 capsule (60 mg total) by mouth daily. 01/19/16  Yes Golden Circle, FNP  Fluticasone-Salmeterol (ADVAIR) 500-50 MCG/DOSE AEPB Inhale 1 puff into the lungs 2 (two) times daily. 01/19/16  Yes Golden Circle, FNP  folic acid (FOLVITE) 1 MG tablet Take 1 tablet (1 mg total) by mouth daily. 03/11/16  Yes Golden Circle, FNP  LORazepam (ATIVAN) 1 MG tablet Take 1 tablet (1 mg total) by mouth 2 (two) times daily as needed (Anxiety, shakiness. DO NOT USE WITH ALCOHOL.). Patient taking differently: Take 1 mg by mouth 2 (two) times daily as needed for anxiety (Anxiety, shakiness. DO NOT USE WITH ALCOHOL.).  01/19/16  Yes Golden Circle, FNP  Magnesium Oxide 400 (240 Mg) MG TABS Take 1 tablet by mouth daily. 01/19/16  Yes Golden Circle, FNP  methocarbamol (ROBAXIN) 500 MG tablet TAKE 1 TABLET BY MOUTH 3 TIMES A DAY AS NEEDED FOR MUSCLE SPASM  Patient taking differently: Take 500 mg by mouth every 8 (eight) hours as needed for muscle spasms. TAKE 1 TABLET BY MOUTH 3 TIMES A DAY AS NEEDED FOR MUSCLE SPASM 03/11/16  Yes Golden Circle, FNP  montelukast (SINGULAIR) 10 MG tablet Take 1 tablet (10 mg total) by mouth at bedtime. 03/11/16  Yes Golden Circle, FNP  Multiple Vitamin (MULTIVITAMIN WITH MINERALS) TABS tablet Take 1 tablet by mouth daily. 01/05/16  Yes Christina P Rama, MD  pantoprazole (PROTONIX) 40 MG tablet Take 1 tablet (40 mg total) by mouth daily. Patient taking differently: Take 40 mg by mouth 2 (two) times daily.  01/05/16  Yes Venetia Maxon Rama, MD  PARoxetine (PAXIL) 10 MG tablet Take 0.5 tablets (5 mg total) by mouth daily. 03/11/16  Yes Golden Circle, FNP  potassium chloride SA (KLOR-CON M20) 20 MEQ tablet Take 1 tablet (20 mEq total) by mouth 2 (two) times daily with a meal.  10/25/15  Yes Golden Circle, FNP  simvastatin (ZOCOR) 40 MG tablet Take 1 tablet (40 mg total) by mouth every morning. 01/19/16  Yes Golden Circle, FNP  sucralfate (CARAFATE) 1 g tablet Take 1 tablet (1 g total) by mouth 4 (four) times daily -  with meals and at bedtime. 03/05/16  Yes Sharlett Iles, MD  thiamine 100 MG tablet Take 1 tablet (100 mg total) by mouth daily. 01/05/16  Yes Venetia Maxon Rama, MD  traMADol (ULTRAM) 50 MG tablet Take 1 tablet (50 mg total) by mouth every 6 (six) hours as needed for moderate pain. 01/19/16  Yes Golden Circle, FNP  zolpidem (AMBIEN) 5 MG tablet TAKE 1 TABLET BY MOUTH EVERY DAY AT BEDTIME AS NEEDED FOR SLEEP Patient taking differently: Take 5 mg by mouth at bedtime as needed for sleep. TAKE 1 TABLET BY MOUTH EVERY DAY AT BEDTIME AS NEEDED FOR SLEEP 03/11/16  Yes Golden Circle, FNP  nicotine (NICODERM CQ - DOSED IN MG/24 HOURS) 21 mg/24hr patch Place 1 patch (21 mg total) onto the skin daily. Patient not taking: Reported on 04/05/2016 01/05/16   Venetia Maxon Rama, MD  triamcinolone cream (KENALOG) 0.1 % Apply topically 2 (two) times daily. Patient not taking: Reported on 04/05/2016 01/05/16   Venetia Maxon Rama, MD    Physical Exam: Patient Vitals for the past 24 hrs:  BP Temp Temp src Pulse Resp SpO2 Height Weight  04/05/16 2226 103/73 mmHg - - 105 20 99 % - -  04/05/16 2141 101/58 mmHg 98 F (36.7 C) Oral 104 18 100 % 5\' 5"  (1.651 m) 68.04 kg (150 lb)    1. General:  in No Acute distress 2. Psychological: Alert and   Oriented 3. Head/ENT:    Dry Mucous Membranes                          Head Non traumatic, neck supple                          Normal  Dentition 4. SKIN  decreased Skin turgor,  Skin clean Dry and intact no rash 5. Heart: Regular rate and rhythm no  Murmur, Rub or gallop 6. Lungs:  no wheezes or crackles   7. Abdomen: Some guarding,  Epigastric tenderness, Non distended 8. Lower extremities: no clubbing, cyanosis, or edema 9.  Neurologically Grossly intact, moving all 4 extremities equally 10. MSK: Normal range of motion Hemoccult positive  body mass index is 24.96 kg/(m^2).  Labs on Admission:   Labs on Admission: I have personally reviewed following labs and imaging studies  CBC:  Recent Labs Lab 04/05/16 2200  WBC 11.6*  NEUTROABS 7.5  HGB 6.4*  HCT 19.8*  MCV 85.3  PLT [id number]   Basic Metabolic Panel:  Recent Labs Lab 04/05/16 2200  NA 137  K 3.9  CL 105  CO2 20*  GLUCOSE 139*  BUN 59*  CREATININE 0.81  CALCIUM 8.5*   GFR: Estimated Creatinine Clearance: 60.6 mL/min (by C-G formula based on Cr of 0.81). Liver Function Tests:  Recent Labs Lab 04/05/16 2200  AST 45*  ALT 24  ALKPHOS 154*  BILITOT 0.6  PROT 5.8*  ALBUMIN 2.8*   No results for input(s): LIPASE, AMYLASE in the last 168 hours. No results for input(s): AMMONIA in the last 168 hours. Coagulation Profile:  Recent Labs Lab 04/05/16 2200  INR 1.29   Cardiac Enzymes: No results for input(s): CKTOTAL, CKMB, CKMBINDEX, TROPONINI in the last 168 hours. BNP (last 3 results) No results for input(s): PROBNP in the last 8760 hours. HbA1C: No results for input(s): HGBA1C in the last 72 hours. CBG: No results for input(s): GLUCAP in the last 168 hours. Lipid Profile: No results for input(s): CHOL, HDL, LDLCALC, TRIG, CHOLHDL, LDLDIRECT in the last 72 hours. Thyroid Function Tests: No results for input(s): TSH, T4TOTAL, FREET4, T3FREE, THYROIDAB in the last 72 hours. Anemia Panel: No results for input(s): VITAMINB12, FOLATE, FERRITIN, TIBC, IRON, RETICCTPCT in the last 72 hours. Urine analysis:    Component Value Date/Time   COLORURINE YELLOW 03/05/2016 0138   APPEARANCEUR CLEAR 03/05/2016 0138   LABSPEC 1.005 03/05/2016 0138   PHURINE 6.5 03/05/2016 0138   GLUCOSEU NEGATIVE 03/05/2016 0138   HGBUR NEGATIVE 03/05/2016 0138   HGBUR negative 07/07/2007 0838   BILIRUBINUR NEGATIVE 03/05/2016 0138   KETONESUR  NEGATIVE 03/05/2016 0138   PROTEINUR NEGATIVE 03/05/2016 0138   UROBILINOGEN 1.0 04/22/2015 0020   NITRITE NEGATIVE 03/05/2016 0138   LEUKOCYTESUR SMALL* 03/05/2016 0138   Sepsis Labs: @LABRCNTIP (procalcitonin:4,lacticidven:4) )No results found for this or any previous visit (from the past 240 hour(s)).       UANot obtained  Lab Results  Component Value Date   HGBA1C  12/03/2010    5.4 (NOTE)                                                                       According to the ADA Clinical Practice Recommendations for 2011, when HbA1c is used as a screening test:   >=6.5%   Diagnostic of Diabetes Mellitus           (if abnormal result  is confirmed)  5.7-6.4%   Increased risk of developing Diabetes Mellitus  References:Diagnosis and Classification of Diabetes Mellitus,Diabetes [id number] 1):S62-S69 and Standards of Medical Care in         Diabetes - 2011,Diabetes Care,2011,34  (Suppl 1):S11-S61.    Estimated Creatinine Clearance: 60.6 mL/min (by C-G formula based on Cr of 0.81).  BNP (last 3 results) No results for input(s): PROBNP in the last 8760 hours.   ECG REPORT Not obtained  [facility] Weights   04/05/16 2141  Weight: 68.04 kg (150 lb)  Cultures:    Component Value Date/Time   SDES URINE, CLEAN CATCH 03/05/2016 0138   SPECREQUEST Normal 03/05/2016 0138   CULT >=100,000 COLONIES/mL ESCHERICHIA COLI* 03/05/2016 0138   REPTSTATUS 03/07/2016 FINAL 03/05/2016 0138     Radiological Exams on Admission: No results found.  Chart has been reviewed    Assessment/Plan   70 y.o. female with medical history significant of alcohol abuse and upper GI bleed in the past, chronic back pain and asthma being admitted for suspected upper GI bleed with acute blood loss anemia requiring blood transfusion to Step down     Present on Admission:  . Acute blood loss anemia - transfuse and follow closely CBC  . Alcohol abuse - patient reports that she had decreased  drinking but has relapsed recently will order CIWA protocol  . Asthma - Continue home medications chronic and currently stable patient reports no history of severe exacerbations   . Acute upper GI bleeding associated with Hematemesis and melena  - appreciate GI consult will make nothing by mouth post midnight, transfuse 2 units of packed red blood cells may need additional transfusion tomorrow. Monitor and step down. Continue Protonix drip. No mention of varices on EGD  Severe abdominal pain - will obtain lactic acid, KUB to evaluate for free air, lipase and if KUB nondiagnostic will likely need CT of the abdomen Other plan as per orders.  DVT prophylaxis:  SCD   Code Status:  FULL CODE as per patient    Family Communication:   Family not  at  Bedside     Disposition Plan:    To home once workup is complete and patient is stable   Consults called: Howie Ill Dr. Cristina Gong   Admission status:    inpatient      Level of care   SDU      I have spent a total of 56 min on this admission    Gabe Glace 04/06/2016, 12:03 AM    [facility]  Pager [phone]   after 2 AM please page floor coverage PA If 7AM-7PM, please contact the day team taking care of the patient  [email or web address]  Password [email or web address]

## 2016-04-06 ENCOUNTER — Inpatient Hospital Stay (HOSPITAL_COMMUNITY): Payer: Medicare HMO

## 2016-04-06 DIAGNOSIS — K922 Gastrointestinal hemorrhage, unspecified: Secondary | ICD-10-CM

## 2016-04-06 LAB — CBC WITH DIFFERENTIAL/PLATELET
Basophils Absolute: 0 10*3/uL (ref 0.0–0.1)
Basophils Relative: 0 %
EOS ABS: 0.2 10*3/uL (ref 0.0–0.7)
Eosinophils Relative: 1 %
HCT: 24.2 % — ABNORMAL LOW (ref 36.0–46.0)
HEMOGLOBIN: 8 g/dL — AB (ref 12.0–15.0)
LYMPHS ABS: 4 10*3/uL (ref 0.7–4.0)
LYMPHS PCT: 33 %
MCH: 27.2 pg (ref 26.0–34.0)
MCHC: 33.1 g/dL (ref 30.0–36.0)
MCV: 82.3 fL (ref 78.0–100.0)
Monocytes Absolute: 1 10*3/uL (ref 0.1–1.0)
Monocytes Relative: 8 %
NEUTROS PCT: 58 %
Neutro Abs: 7.1 10*3/uL (ref 1.7–7.7)
Platelets: 269 10*3/uL (ref 150–400)
RBC: 2.94 MIL/uL — AB (ref 3.87–5.11)
RDW: 16.4 % — ABNORMAL HIGH (ref 11.5–15.5)
WBC: 12.3 10*3/uL — AB (ref 4.0–10.5)

## 2016-04-06 LAB — MRSA PCR SCREENING: MRSA by PCR: NEGATIVE

## 2016-04-06 LAB — LACTIC ACID, PLASMA
LACTIC ACID, VENOUS: 3.7 mmol/L — AB (ref 0.5–2.0)
Lactic Acid, Venous: 0.9 mmol/L (ref 0.5–2.0)

## 2016-04-06 LAB — TSH: TSH: 0.385 u[IU]/mL (ref 0.350–4.500)

## 2016-04-06 LAB — URINALYSIS, ROUTINE W REFLEX MICROSCOPIC
BILIRUBIN URINE: NEGATIVE
GLUCOSE, UA: NEGATIVE mg/dL
HGB URINE DIPSTICK: NEGATIVE
KETONES UR: NEGATIVE mg/dL
LEUKOCYTES UA: NEGATIVE
Nitrite: NEGATIVE
PROTEIN: NEGATIVE mg/dL
Specific Gravity, Urine: 1.04 — ABNORMAL HIGH (ref 1.005–1.030)
pH: 6 (ref 5.0–8.0)

## 2016-04-06 LAB — HEMOGLOBIN AND HEMATOCRIT, BLOOD
HEMATOCRIT: 22 % — AB (ref 36.0–46.0)
HEMOGLOBIN: 7.3 g/dL — AB (ref 12.0–15.0)

## 2016-04-06 LAB — COMPREHENSIVE METABOLIC PANEL
ALT: 22 U/L (ref 14–54)
ANION GAP: 9 (ref 5–15)
AST: 46 U/L — ABNORMAL HIGH (ref 15–41)
Albumin: 2.6 g/dL — ABNORMAL LOW (ref 3.5–5.0)
Alkaline Phosphatase: 131 U/L — ABNORMAL HIGH (ref 38–126)
BUN: 56 mg/dL — ABNORMAL HIGH (ref 6–20)
CHLORIDE: 110 mmol/L (ref 101–111)
CO2: 20 mmol/L — AB (ref 22–32)
Calcium: 7.9 mg/dL — ABNORMAL LOW (ref 8.9–10.3)
Creatinine, Ser: 0.78 mg/dL (ref 0.44–1.00)
GFR calc non Af Amer: >60 mL/min (ref >60)
Glucose, Bld: 107 mg/dL — ABNORMAL HIGH (ref 65–99)
Potassium: 4 mmol/L (ref 3.5–5.1)
SODIUM: 139 mmol/L (ref 135–145)
Total Bilirubin: 0.9 mg/dL (ref 0.3–1.2)
Total Protein: 5.3 g/dL — ABNORMAL LOW (ref 6.5–8.1)

## 2016-04-06 LAB — MAGNESIUM: MAGNESIUM: 1.5 mg/dL — AB (ref 1.7–2.4)

## 2016-04-06 LAB — LIPASE, BLOOD: Lipase: 18 U/L (ref 11–51)

## 2016-04-06 LAB — PHOSPHORUS: Phosphorus: 3.9 mg/dL (ref 2.5–4.6)

## 2016-04-06 LAB — PREPARE RBC (CROSSMATCH)

## 2016-04-06 MED ORDER — ACETAMINOPHEN 325 MG PO TABS: 650.0000 mg | ORAL_TABLET | Freq: Four times a day (QID) | ORAL | Status: DC | PRN

## 2016-04-06 MED ORDER — SIMVASTATIN 40 MG PO TABS: 40.0000 mg | ORAL_TABLET | Freq: Every day | ORAL | Status: DC

## 2016-04-06 MED ORDER — SODIUM CHLORIDE 0.9% FLUSH
3.0000 mL | Freq: Two times a day (BID) | INTRAVENOUS | Status: DC
  Administered 2016-04-06 – 2016-04-08 (×4): 3 mL via INTRAVENOUS

## 2016-04-06 MED ORDER — ZOLPIDEM TARTRATE 5 MG PO TABS: 5.0000 mg | ORAL_TABLET | Freq: Every evening | ORAL | Status: DC | PRN

## 2016-04-06 MED ORDER — MOMETASONE FURO-FORMOTEROL FUM 200-5 MCG/ACT IN AERO
2.0000 | INHALATION_SPRAY | Freq: Two times a day (BID) | RESPIRATORY_TRACT | Status: DC
  Administered 2016-04-06 – 2016-04-10 (×9): 2 via RESPIRATORY_TRACT
  Filled 2016-04-06: qty 8.8

## 2016-04-06 MED ORDER — DULOXETINE HCL 60 MG PO CPEP
60.0000 mg | ORAL_CAPSULE | Freq: Every day | ORAL | Status: DC
  Administered 2016-04-06 – 2016-04-10 (×5): 60 mg via ORAL
  Filled 2016-04-06 (×3): qty 2
  Filled 2016-04-06 (×2): qty 1

## 2016-04-06 MED ORDER — SODIUM CHLORIDE 0.9 % IV SOLN
INTRAVENOUS | Status: DC
  Administered 2016-04-07 – 2016-04-08 (×5): via INTRAVENOUS

## 2016-04-06 MED ORDER — PAROXETINE HCL 10 MG PO TABS
5.0000 mg | ORAL_TABLET | Freq: Every day | ORAL | Status: DC
  Administered 2016-04-06 – 2016-04-10 (×4): 5 mg via ORAL
  Filled 2016-04-06 (×5): qty 0.5

## 2016-04-06 MED ORDER — LORAZEPAM 2 MG/ML IJ SOLN
1.0000 mg | Freq: Four times a day (QID) | INTRAMUSCULAR | Status: AC | PRN
  Administered 2016-04-06 (×2): 1 mg via INTRAVENOUS
  Filled 2016-04-06 (×2): qty 1

## 2016-04-06 MED ORDER — ONDANSETRON HCL 4 MG/2ML IJ SOLN
4.0000 mg | Freq: Four times a day (QID) | INTRAMUSCULAR | Status: DC | PRN
  Administered 2016-04-06: 4 mg via INTRAVENOUS
  Filled 2016-04-06: qty 2

## 2016-04-06 MED ORDER — LORAZEPAM 1 MG PO TABS
1.0000 mg | ORAL_TABLET | Freq: Four times a day (QID) | ORAL | Status: AC | PRN
  Administered 2016-04-07 – 2016-04-08 (×4): 1 mg via ORAL
  Filled 2016-04-06 (×4): qty 1

## 2016-04-06 MED ORDER — SODIUM CHLORIDE 0.9 % IV SOLN
Freq: Once | INTRAVENOUS | Status: AC
  Administered 2016-04-06: 03:00:00 via INTRAVENOUS

## 2016-04-06 MED ORDER — DIPHENHYDRAMINE HCL 25 MG PO CAPS
25.0000 mg | ORAL_CAPSULE | Freq: Three times a day (TID) | ORAL | Status: DC | PRN
  Administered 2016-04-06 – 2016-04-09 (×5): 25 mg via ORAL
  Filled 2016-04-06 (×4): qty 1

## 2016-04-06 MED ORDER — OCTREOTIDE LOAD VIA INFUSION
50.0000 ug | Freq: Once | INTRAVENOUS | Status: AC
  Administered 2016-04-06: 50 ug via INTRAVENOUS
  Filled 2016-04-06: qty 25

## 2016-04-06 MED ORDER — ALBUTEROL SULFATE HFA 108 (90 BASE) MCG/ACT IN AERS: 2.0000 | INHALATION_SPRAY | Freq: Four times a day (QID) | RESPIRATORY_TRACT | Status: DC | PRN

## 2016-04-06 MED ORDER — DEXTROSE 5 % IV SOLN
1.0000 g | INTRAVENOUS | Status: DC
  Administered 2016-04-06 – 2016-04-10 (×5): 1 g via INTRAVENOUS
  Filled 2016-04-06 (×5): qty 10

## 2016-04-06 MED ORDER — IOPAMIDOL (ISOVUE-300) INJECTION 61%
100.0000 mL | Freq: Once | INTRAVENOUS | Status: AC | PRN
  Administered 2016-04-06: 100 mL via INTRAVENOUS

## 2016-04-06 MED ORDER — TRAMADOL HCL 50 MG PO TABS
50.0000 mg | ORAL_TABLET | Freq: Four times a day (QID) | ORAL | Status: DC | PRN
  Administered 2016-04-07 – 2016-04-10 (×7): 50 mg via ORAL
  Filled 2016-04-06 (×7): qty 1

## 2016-04-06 MED ORDER — SODIUM CHLORIDE 0.9 % IV SOLN
50.0000 ug/h | INTRAVENOUS | Status: DC
Start: 2016-04-06 — End: 2016-04-07
  Administered 2016-04-06 – 2016-04-07 (×2): 50 ug/h via INTRAVENOUS
  Filled 2016-04-06 (×5): qty 1

## 2016-04-06 MED ORDER — ALBUTEROL SULFATE (2.5 MG/3ML) 0.083% IN NEBU: 2.5000 mg | INHALATION_SOLUTION | Freq: Four times a day (QID) | RESPIRATORY_TRACT | Status: DC | PRN

## 2016-04-06 MED ORDER — ACETAMINOPHEN 650 MG RE SUPP: 650.0000 mg | Freq: Four times a day (QID) | RECTAL | Status: DC | PRN

## 2016-04-06 MED ORDER — DIPHENHYDRAMINE HCL 25 MG PO CAPS: 25.0000 mg | ORAL_CAPSULE | Freq: Once | ORAL | Status: DC

## 2016-04-06 MED ORDER — NICOTINE 21 MG/24HR TD PT24
21.0000 mg | MEDICATED_PATCH | Freq: Every day | TRANSDERMAL | Status: DC
  Administered 2016-04-06 – 2016-04-10 (×5): 21 mg via TRANSDERMAL
  Filled 2016-04-06 (×5): qty 1

## 2016-04-06 MED ORDER — ONDANSETRON HCL 4 MG PO TABS: 4.0000 mg | ORAL_TABLET | Freq: Four times a day (QID) | ORAL | Status: DC | PRN

## 2016-04-06 MED ORDER — ONDANSETRON HCL 4 MG/2ML IJ SOLN: 4.0000 mg | Freq: Three times a day (TID) | INTRAMUSCULAR | Status: DC | PRN

## 2016-04-06 MED ORDER — ACETAMINOPHEN 325 MG PO TABS: 650.0000 mg | ORAL_TABLET | Freq: Once | ORAL | Status: DC

## 2016-04-06 MED ORDER — SODIUM CHLORIDE 0.9 % IV SOLN
INTRAVENOUS | Status: DC
  Administered 2016-04-06: 03:00:00 via INTRAVENOUS

## 2016-04-06 MED ORDER — THIAMINE HCL 100 MG/ML IJ SOLN
100.0000 mg | Freq: Every day | INTRAMUSCULAR | Status: DC
  Administered 2016-04-06 – 2016-04-08 (×2): 100 mg via INTRAVENOUS
  Filled 2016-04-06: qty 2
  Filled 2016-04-06: qty 1
  Filled 2016-04-06: qty 2

## 2016-04-06 MED ORDER — FOLIC ACID 5 MG/ML IJ SOLN
1.0000 mg | Freq: Every day | INTRAMUSCULAR | Status: DC
  Administered 2016-04-06 – 2016-04-08 (×2): 1 mg via INTRAVENOUS
  Filled 2016-04-06 (×5): qty 0.2

## 2016-04-06 MED ORDER — MONTELUKAST SODIUM 10 MG PO TABS
10.0000 mg | ORAL_TABLET | Freq: Every day | ORAL | Status: DC
  Administered 2016-04-06 – 2016-04-09 (×4): 10 mg via ORAL
  Filled 2016-04-06 (×5): qty 1

## 2016-04-06 MED ORDER — HYDROMORPHONE HCL 1 MG/ML IJ SOLN
0.5000 mg | INTRAMUSCULAR | Status: DC | PRN
Start: 2016-04-06 — End: 2016-04-09
  Administered 2016-04-06 – 2016-04-09 (×7): 0.5 mg via INTRAVENOUS
  Filled 2016-04-06 (×7): qty 1

## 2016-04-06 MED ORDER — MORPHINE SULFATE (PF) 2 MG/ML IV SOLN
2.0000 mg | INTRAVENOUS | Status: DC | PRN
  Administered 2016-04-06: 2 mg via INTRAVENOUS
  Filled 2016-04-06: qty 1

## 2016-04-06 NOTE — Consult Note (Signed)
Referring Provider:  Dr. Roel Cluck Primary Care Physician:  [name], [place] Primary Gastroenterologist:  Althia Forts  Reason for Consultation:  GI bleed  HPI: [name] is a 70 y.o. female with past medical history significant of alcohol abuse/cirrhosis and upper GI bleed in the past, chronic back pain, and asthma.  She apparently presented with dark blood and vomiting since 5 PM on 5/26 with 4 episodes of that, but she tells me today that she's not had any vomiting.  She reports melena for the past couple of days.  Denies NSAIDs and not on any anticoagulation.  Still drinking ETOH.  Is a poor historian, at least currently.  Patient had admission for significant hematemesis requiring blood transfusion in February 2017.  Dr. Paulita Fujita saw the patient in consult and performed EGD, which showed large gastric ulcers which were not bleeding.  Also had friable GE junction.  IN ER: Tachycardia to 105 blood pressure 103/73 WBC 11.6 hemoglobin 6.4 (down from 11.7 just one month ago) platelets 359 BUN 59 with creatinine 0.81 INR 1.29  Hemoccult positive Started on protonix gtt Started on IV Rocephin She was given 500 mL normal saline bolus and  typed & screened for blood administration--Received 2 units PRBCs with Hgb increased to 8.0 grams.  CT scan of the abdomen and pelvis with contrast showed the following:  IMPRESSION: 1. No acute abnormality seen to explain the patient's symptoms. 2. Suggestion of mild hepatic cirrhosis, with esophageal varices andmild recanalization of the umbilical vein. 3. Calcification at the proximal renal arteries bilaterally, and along the distal abdominal aorta and its branches. 4. Mildly worsened prominent degenerative change at L5-S1, with multilevel vacuum phenomenon along the lumbar spine.  Per nursing staff, she had one black/bloody BM overnight.  No further vomiting.  Past Medical History  Diagnosis Date  . Seasonal allergies   .  Hypercholesteremia     under control  . GERD (gastroesophageal reflux disease)     occasional  . Arthritis     back-severe, hips, right knee  . H/O mumps   . H/O measles   . Insomnia   . Asthma   . Allergy   . Renal cell carcinoma 2012    left  . Psoriasis (a type of skin inflammation)   . Alcohol abuse   . Alcohol abuse     Past Surgical History  Procedure Laterality Date  . Kidney surgery  12/2010    [facility]; partial nephrectomy  . Hernia repair  [id number]    supraumbilical repair  . Bunionectomy  04/2011  . Menisectomy  2010    left knee  . Cholecystectomy  11/13/2011    Procedure: LAPAROSCOPIC CHOLECYSTECTOMY WITH INTRAOPERATIVE CHOLANGIOGRAM;  Surgeon: Judieth Keens, DO;  Location: [facility];  Service: General;  Laterality: N/A;  . Esophagogastroduodenoscopy N/A 09/25/2013    Procedure: ESOPHAGOGASTRODUODENOSCOPY (EGD);  Surgeon: Lear Ng, MD;  Location: [facility];  Service: Endoscopy;  Laterality: N/A;  . Colonoscopy N/A 09/25/2013    Procedure: COLONOSCOPY;  Surgeon: Lear Ng, MD;  Location: [facility];  Service: Endoscopy;  Laterality: N/A;  . Abdominal hysterectomy  40years ago  . Tubal ligation  44 years ago  . Lumbar laminectomy/decompression microdiscectomy Right 10/12/2014    Procedure: HEMI LAMINECTOMY MICRODISCECTOMY L5-S1 RIGHT (1 LEVEL);  Surgeon: Tobi Bastos, MD;  Location: [facility];  Service: Orthopedics;  Laterality: Right;  . Esophagogastroduodenoscopy (egd) with propofol Left 01/03/2016    Procedure: ESOPHAGOGASTRODUODENOSCOPY (EGD) WITH PROPOFOL;  Surgeon: Arta Silence,  MD;  Location: [facility];  Service: Endoscopy;  Laterality: Left;    Prior to Admission medications   Medication Sig Start Date End Date Taking? Authorizing Provider  albuterol (PROVENTIL HFA;VENTOLIN HFA) 108 (90 Base) MCG/ACT inhaler Inhale 2 puffs into the lungs every 6 (six) hours as needed for wheezing or shortness of breath. 01/19/16  Yes Golden Circle, FNP  docusate sodium ([employer] STOOL SOFTENER) 100 MG capsule TAKE 1-2 CAPSULES BY MOUTH DAILY AS NEEDED FOR MILD CONSTIPATION. Patient taking differently: Take 100-200 mg by mouth daily as needed for mild constipation. TAKE 1-2 CAPSULES BY MOUTH DAILY AS NEEDED FOR MILD CONSTIPATION. 03/11/16  Yes Golden Circle, FNP  DULoxetine (CYMBALTA) 60 MG capsule Take 1 capsule (60 mg total) by mouth daily. 01/19/16  Yes Golden Circle, FNP  Fluticasone-Salmeterol (ADVAIR) 500-50 MCG/DOSE AEPB Inhale 1 puff into the lungs 2 (two) times daily. 01/19/16  Yes Golden Circle, FNP  folic acid (FOLVITE) 1 MG tablet Take 1 tablet (1 mg total) by mouth daily. 03/11/16  Yes Golden Circle, FNP  LORazepam (ATIVAN) 1 MG tablet Take 1 tablet (1 mg total) by mouth 2 (two) times daily as needed (Anxiety, shakiness. DO NOT USE WITH ALCOHOL.). Patient taking differently: Take 1 mg by mouth 2 (two) times daily as needed for anxiety (Anxiety, shakiness. DO NOT USE WITH ALCOHOL.).  01/19/16  Yes Golden Circle, FNP  Magnesium Oxide 400 (240 Mg) MG TABS Take 1 tablet by mouth daily. 01/19/16  Yes Golden Circle, FNP  methocarbamol (ROBAXIN) 500 MG tablet TAKE 1 TABLET BY MOUTH 3 TIMES A DAY AS NEEDED FOR MUSCLE SPASM Patient taking differently: Take 500 mg by mouth every 8 (eight) hours as needed for muscle spasms. TAKE 1 TABLET BY MOUTH 3 TIMES A DAY AS NEEDED FOR MUSCLE SPASM 03/11/16  Yes Golden Circle, FNP  montelukast (SINGULAIR) 10 MG tablet Take 1 tablet (10 mg total) by mouth at bedtime. 03/11/16  Yes Golden Circle, FNP  Multiple Vitamin (MULTIVITAMIN WITH MINERALS) TABS tablet Take 1 tablet by mouth daily. 01/05/16  Yes Christina P Rama, MD  pantoprazole (PROTONIX) 40 MG tablet Take 1 tablet (40 mg total) by mouth daily. Patient taking differently: Take 40 mg by mouth 2 (two) times daily.  01/05/16  Yes Venetia Maxon Rama, MD  PARoxetine (PAXIL) 10 MG tablet Take 0.5 tablets (5 mg total) by mouth daily. 03/11/16  Yes  Golden Circle, FNP  potassium chloride SA (KLOR-CON M20) 20 MEQ tablet Take 1 tablet (20 mEq total) by mouth 2 (two) times daily with a meal. 10/25/15  Yes Golden Circle, FNP  simvastatin (ZOCOR) 40 MG tablet Take 1 tablet (40 mg total) by mouth every morning. 01/19/16  Yes Golden Circle, FNP  sucralfate (CARAFATE) 1 g tablet Take 1 tablet (1 g total) by mouth 4 (four) times daily -  with meals and at bedtime. 03/05/16  Yes Sharlett Iles, MD  thiamine 100 MG tablet Take 1 tablet (100 mg total) by mouth daily. 01/05/16  Yes Venetia Maxon Rama, MD  traMADol (ULTRAM) 50 MG tablet Take 1 tablet (50 mg total) by mouth every 6 (six) hours as needed for moderate pain. 01/19/16  Yes Golden Circle, FNP  zolpidem (AMBIEN) 5 MG tablet TAKE 1 TABLET BY MOUTH EVERY DAY AT BEDTIME AS NEEDED FOR SLEEP Patient taking differently: Take 5 mg by mouth at bedtime as needed for sleep. TAKE 1 TABLET BY MOUTH EVERY  DAY AT BEDTIME AS NEEDED FOR SLEEP 03/11/16  Yes Golden Circle, FNP  nicotine (NICODERM CQ - DOSED IN MG/24 HOURS) 21 mg/24hr patch Place 1 patch (21 mg total) onto the skin daily. Patient not taking: Reported on 04/05/2016 01/05/16   Venetia Maxon Rama, MD  triamcinolone cream (KENALOG) 0.1 % Apply topically 2 (two) times daily. Patient not taking: Reported on 04/05/2016 01/05/16   Venetia Maxon Rama, MD    Current Facility-Administered Medications  Medication Dose Route Frequency Provider Last Rate Last Dose  . 0.9 %  sodium chloride infusion   Intravenous Continuous Belkys A Regalado, MD 100 mL/hr at 04/06/16 0730    . acetaminophen (TYLENOL) tablet 650 mg  650 mg Oral Q6H PRN Toy Baker, MD       Or  . acetaminophen (TYLENOL) suppository 650 mg  650 mg Rectal Q6H PRN Toy Baker, MD      . acetaminophen (TYLENOL) tablet 650 mg  650 mg Oral Once Toy Baker, MD   Stopped at 04/06/16 0130  . albuterol (PROVENTIL) (2.5 MG/3ML) 0.083% nebulizer solution 2.5 mg  2.5 mg  Nebulization Q6H PRN Toy Baker, MD      . cefTRIAXone (ROCEPHIN) 1 g in dextrose 5 % 50 mL IVPB  1 g Intravenous Q24H Belkys A Regalado, MD   1 g at 04/06/16 0800  . diphenhydrAMINE (BENADRYL) capsule 25 mg  25 mg Oral Once Toy Baker, MD   Stopped at 04/06/16 0130  . diphenhydrAMINE (BENADRYL) capsule 25 mg  25 mg Oral Q8H PRN Belkys A Regalado, MD   25 mg at 04/06/16 1017  . DULoxetine (CYMBALTA) DR capsule 60 mg  60 mg Oral Daily Toy Baker, MD   60 mg at 04/06/16 0910  . folic acid injection 1 mg  1 mg Intravenous Daily Toy Baker, MD   1 mg at 04/06/16 0947  . HYDROmorphone (DILAUDID) injection 0.5 mg  0.5 mg Intravenous Q3H PRN Ritta Slot, NP   0.5 mg at 04/06/16 0909  . LORazepam (ATIVAN) tablet 1 mg  1 mg Oral Q6H PRN Elmarie Shiley, MD       Or  . LORazepam (ATIVAN) injection 1 mg  1 mg Intravenous Q6H PRN Belkys A Regalado, MD   1 mg at 04/06/16 1017  . mometasone-formoterol (DULERA) 200-5 MCG/ACT inhaler 2 puff  2 puff Inhalation BID Toy Baker, MD   2 puff at 04/06/16 0855  . montelukast (SINGULAIR) tablet 10 mg  10 mg Oral QHS Toy Baker, MD   Stopped at 04/06/16 0130  . nicotine (NICODERM CQ - dosed in mg/24 hours) patch 21 mg  21 mg Transdermal Daily Toy Baker, MD   21 mg at 04/06/16 0909  . ondansetron (ZOFRAN) tablet 4 mg  4 mg Oral Q6H PRN Toy Baker, MD       Or  . ondansetron (ZOFRAN) injection 4 mg  4 mg Intravenous Q6H PRN Toy Baker, MD      . pantoprazole (PROTONIX) 80 mg in sodium chloride 0.9 % 250 mL (0.32 mg/mL) infusion  8 mg/hr Intravenous Continuous Carlisle Cater, PA-C 25 mL/hr at 04/05/16 2355 8 mg/hr at 04/05/16 2355  . PARoxetine (PAXIL) tablet 5 mg  5 mg Oral Daily Toy Baker, MD   5 mg at 04/06/16 0910  . sodium chloride flush (NS) 0.9 % injection 3 mL  3 mL Intravenous Q12H Toy Baker, MD   3 mL at 04/06/16 1000  . thiamine (B-1) injection 100 mg  100  mg Intravenous  Daily Toy Baker, MD   100 mg at 04/06/16 0908  . traMADol (ULTRAM) tablet 50 mg  50 mg Oral Q6H PRN Toy Baker, MD        Allergies as of 04/05/2016 - Review Complete 04/05/2016  Allergen Reaction Noted  . Azithromycin Itching and Swelling 10/17/2006    Family History  Problem Relation Age of Onset  . Hyperlipidemia Mother   . Hypertension Mother     Social History   Social History  . Marital Status: Divorced    Spouse Name: N/A  . Number of Children: 3  . Years of Education: 12   Occupational History  . Retired    Social History Main Topics  . Smoking status: Current Some Day Smoker -- 0.15 packs/day for 6 years    Types: Cigarettes  . Smokeless tobacco: Never Used  . Alcohol Use: 0.0 oz/week    0 Standard drinks or equivalent per week     Comment: socially  . Drug Use: No  . Sexual Activity: Yes   Other Topics Concern  . Not on file   Social History Narrative   Fun: play cards, play with her grandchildren, cooking   Denies religious beliefs effecting health care.     Review of Systems: Ten point ROS is O/W negative except as mentioned in HPI,  Physical Exam: Vital signs in last 24 hours: Temp:  [97.6 F (36.4 C)-98.3 F (36.8 C)] 98.1 F (36.7 C) (05/27 0800) Pulse Rate:  [94-117] 94 (05/27 0900) Resp:  [13-20] 17 (05/27 0900) BP: (86-117)/(35-73) 113/60 mmHg (05/27 0900) SpO2:  [97 %-100 %] 100 % (05/27 0900) Weight:  [150 lb (68.04 kg)] 150 lb (68.04 kg) (05/26 2141) Last BM Date: 04/06/16 General:  Alert, Well-developed, well-nourished, pleasant and cooperative in NAD although not a great historian. Head:  Normocephalic and atraumatic. Eyes:  Sclera clear, no icterus.  Conjunctiva pink. Ears:  Normal auditory acuity. Mouth:  No deformity or lesions.   Lungs:  Clear throughout to auscultation.  No wheezes, crackles, or rhonchi.  Heart:  Regular rate and rhythm; no murmurs, clicks, rubs, or gallops. Abdomen:  Soft, non-distended.   BS present.  Mild upper abdominal TTP.  Rectal:  Deferred.  Was heme positive.  Msk:  Symmetrical without gross deformities. Pulses:  Normal pulses noted. Extremities:  Without clubbing or edema. Neurologic:  Alert and oriented x 4;  grossly normal neurologically. Skin:  Intact without significant lesions or rashes. Psych:  Alert and cooperative. Normal mood and affect.  Lab Results:  Recent Labs  04/05/16 2200 04/06/16 0844  WBC 11.6* 12.3*  HGB 6.4* 8.0*  HCT 19.8* 24.2*  PLT 359 269   BMET  Recent Labs  04/05/16 2200  NA 137  K 3.9  CL 105  CO2 20*  GLUCOSE 139*  BUN 59*  CREATININE 0.81  CALCIUM 8.5*   LFT  Recent Labs  04/05/16 2200  PROT 5.8*  ALBUMIN 2.8*  AST 45*  ALT 24  ALKPHOS 154*  BILITOT 0.6   PT/INR  Recent Labs  04/05/16 2200  LABPROT 16.2*  INR 1.29   Studies/Results: Ct Abdomen Pelvis W Contrast  04/06/2016  CLINICAL DATA:  Acute onset of hematemesis. Decreased hemoglobin. Initial encounter. EXAM: CT ABDOMEN AND PELVIS WITH CONTRAST TECHNIQUE: Multidetector CT imaging of the abdomen and pelvis was performed using the standard protocol following bolus administration of intravenous contrast. CONTRAST:  167mL ISOVUE-300 IOPAMIDOL (ISOVUE-300) INJECTION 61% COMPARISON:  CT of the abdomen and  pelvis from 03/05/2016 FINDINGS: The visualized lung bases are clear. The slightly nodular contour of the liver is concerning for mild hepatic cirrhosis. The spleen is diminutive and grossly unremarkable. The patient is status post cholecystectomy, with clips noted at the gallbladder fossa. The pancreas and adrenal glands are unremarkable. Esophageal varices are seen. There is mild recanalization of the umbilical vein. The kidneys are unremarkable in appearance. There is no evidence of hydronephrosis. No renal or ureteral stones are seen. Mild nonspecific perinephric stranding is noted bilaterally. No free fluid is identified. The small bowel is unremarkable  in appearance. The stomach is within normal limits. No acute vascular abnormalities are seen. Calcification is seen at the proximal renal arteries bilaterally, and along the distal abdominal aorta and its branches. The appendix is normal in caliber and contains air, without evidence of appendicitis. The colon is grossly unremarkable in appearance. The bladder is relatively decompressed, likely explaining apparent bladder wall thickening. The patient is status post hysterectomy. No suspicious adnexal masses are seen. No inguinal lymphadenopathy is seen. No acute osseous abnormalities are identified. Prominent endplate irregularity is noted at L5-S1, similar in appearance to the prior study, though mildly worsened from 2015. Multilevel vacuum phenomenon is noted along the lumbar spine, with disc space narrowing. IMPRESSION: 1. No acute abnormality seen to explain the patient's symptoms. 2. Suggestion of mild hepatic cirrhosis, with esophageal varices and mild recanalization of the umbilical vein. 3. Calcification at the proximal renal arteries bilaterally, and along the distal abdominal aorta and its branches. 4. Mildly worsened prominent degenerative change at L5-S1, with multilevel vacuum phenomenon along the lumbar spine. Electronically Signed   By: Garald Balding M.D.   On: 04/06/2016 04:03   Dg Abd 2 Views  04/06/2016  CLINICAL DATA:  Acute onset of lower abdominal pain, nausea and vomiting. Blood found in stool, urine and emesis. Initial encounter. EXAM: ABDOMEN - 2 VIEW COMPARISON:  CT of the abdomen and pelvis performed 03/05/2016 FINDINGS: The visualized bowel gas pattern is unremarkable. Scattered air and stool filled loops of colon are seen; no abnormal dilatation of small bowel loops is seen to suggest small bowel obstruction. No free intra-abdominal air is seen on the provided decubitus view. The visualized osseous structures are within normal limits; the sacroiliac joints are unremarkable in  appearance. The visualized lung bases are essentially clear. Clips are noted within the right upper quadrant, reflecting prior cholecystectomy. IMPRESSION: Unremarkable bowel gas pattern; no free intra-abdominal air seen. Small amount of stool noted in the colon. Electronically Signed   By: Garald Balding M.D.   On: 04/06/2016 00:25   IMPRESSION:  -70 year old female with history of ulcers and ETOH cirrhosis who presented with hematemesis and melena.  She has varices by CT scan but none noted on last EGD 3 months ago.   -Acute blood loss anemia secondary to GI bleed:  Hgb was down 5 grams as compared to one month ago.  She has now received 2 units PRBC's.  BUN elevated. -ETOH abuse  PLAN: -Already on PPI gtt and Rocephin empirically.  Will add octreotide gtt as well. -EGD 5/28 or sooner if she re-bleeds acutely. -Monitor Hgb and transfuse further prn.   ZEHR, JESSICA D.  04/06/2016, 10:28 AM  Pager number [phone]    ________________________________________________________________________  Velora Heckler GI MD note:  I personally examined the patient, reviewed the data and agree with the assessment and plan described above.  Woman with alcoholic cirrhosis, melena and anemia. Very poor historian. EGD with  Dr. Paulita Fujita 3 months ago; gastric ulcers.  H. Pylori serology was negative.  She was discharged on once daily PPI and was to follow up with him in office but she never had that appointment.  Now with melena, acute drop in Hb.  I cannot tell that she was still on her PPI (not on her admitting med list) and she is frankly too poor a historian to know.  Given her cirrhosis on imaging, suggstion of esophageal varices by CT (not on 12/2015 EGD though) we're putting her on octreotide drip as well as IV PPI drip and plan on EGD tomorrow.  Owens Loffler, MD [facility] Pager [phone]

## 2016-04-06 NOTE — ED Notes (Signed)
Blood draw for lactic delayed, pt currently receiving blood tranfusion.  RN will collect labs once transfusion is finished.

## 2016-04-06 NOTE — Progress Notes (Addendum)
PROGRESS NOTE    [name]  [id number] DOB: October 03, 1946 DOA: 04/05/2016 PCP: [name], FNP   Brief Narrative:  [name] is a 70 y.o. female with medical history significant of alcohol abuse and upper GI bleed in the past, chronic back pain and asthma. Presented with dark blood and vomiting since 5 PM today have had at least 4 episodes. At first she had severe epigastric abdominal pain radiating to her back. He reports melena for the past 24 hours   Assessment & Plan:   Active Problems:   Asthma   Hematemesis   Alcohol abuse   Abdominal pain   Acute blood loss anemia   Acute upper GI bleeding   Upper GI bleed   Acute Blood loss Anemia; S/P post 2 units PRBC.  Hb for this am pending.  Further transfusion as needed.  Likely related to GI bleed.  IV protonix.   Acute upper GI bleed:  Start Ceftriaxone for SBP prophylaxis;  Hb pending for this am. If below 8 will provide blood transfusion.  IV protonix.  GI consulted.   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 Patient aware of results. Encourage alcohol cessation.  Start ceftriaxone for SBP prophylaxis.  Will stop statins.  INR at 1.29, not elevated.   Alcohol abuse: continue with CIWA protocol. Counseling provided to patient   Abdominal Pain. Related to gastritis, vs ulcer. IV protonix. CT abdomen no finding that could explain pain.   Lactic acidosis: suspect related to hypoperfusion. Follow lactic acid trend. Patient afebrile, no significant leukocytosis. Started on ceftriaxone to covers for SB prophylaxis. CT abdomen negative for infection. Check UA.   Asthma; PRN albuterol.   DVT prophylaxis: SCD in setting of GI bleeding.  Code Status: Full code.  Family Communication: care discussed with patient.  Disposition Plan: remain inpatient.    Consultants:   [facility]  Procedures:  none   Antimicrobials:   Ceftriaxone for SBP prophylaxis.    Subjective: She is feeling ok,  would like something for pain. Abdominal pain has improved.  Was vomiting overnight   Objective: Filed Vitals:   04/06/16 0419 04/06/16 0500 04/06/16 0600 04/06/16 0630  BP: 105/56 94/59 93/64  117/67  Pulse: 105 108 105 102  Temp: 98.3 F (36.8 C)   98.3 F (36.8 C)  TempSrc: Oral   Oral  Resp: 15 18 13 19   Height:      Weight:      SpO2: 99% 100% 100% 99%    Intake/Output Summary (Last 24 hours) at 04/06/16 0716 Last data filed at 04/06/16 0630  Gross per 24 hour  Intake 890.41 ml  Output    150 ml  Net 740.41 ml   Filed Weights   04/05/16 2141  Weight: 68.04 kg (150 lb)    Examination:  General exam: Appears calm and comfortable  Respiratory system: Clear to auscultation. Respiratory effort normal. Cardiovascular system: S1 & S2 heard, RRR. No JVD, murmurs, rubs, gallops or clicks. No pedal edema. Gastrointestinal system: Abdomen is nondistended, soft and nontender. No organomegaly or masses felt. Normal bowel sounds heard. Central nervous system: Alert and oriented. No focal neurological deficits. Extremities: Symmetric 5 x 5 power. Skin: No rashes, lesions or ulcers Psychiatry: Judgement and insight appear normal. Mood & affect appropriate.     Data Reviewed: I have personally reviewed following labs and imaging studies  CBC:  Recent Labs Lab 04/05/16 2200  WBC 11.6*  NEUTROABS 7.5  HGB 6.4*  HCT  19.8*  MCV 85.3  PLT [id number]   Basic Metabolic Panel:  Recent Labs Lab 04/05/16 2200  NA 137  K 3.9  CL 105  CO2 20*  GLUCOSE 139*  BUN 59*  CREATININE 0.81  CALCIUM 8.5*   GFR: Estimated Creatinine Clearance: 60.6 mL/min (by C-G formula based on Cr of 0.81). Liver Function Tests:  Recent Labs Lab 04/05/16 2200  AST 45*  ALT 24  ALKPHOS 154*  BILITOT 0.6  PROT 5.8*  ALBUMIN 2.8*    Recent Labs Lab 04/06/16 0033  LIPASE 18   No results for input(s): AMMONIA in the last 168 hours. Coagulation Profile:  Recent Labs Lab 04/05/16 2200   INR 1.29   Cardiac Enzymes: No results for input(s): CKTOTAL, CKMB, CKMBINDEX, TROPONINI in the last 168 hours. BNP (last 3 results) No results for input(s): PROBNP in the last 8760 hours. HbA1C: No results for input(s): HGBA1C in the last 72 hours. CBG: No results for input(s): GLUCAP in the last 168 hours. Lipid Profile: No results for input(s): CHOL, HDL, LDLCALC, TRIG, CHOLHDL, LDLDIRECT in the last 72 hours. Thyroid Function Tests: No results for input(s): TSH, T4TOTAL, FREET4, T3FREE, THYROIDAB in the last 72 hours. Anemia Panel: No results for input(s): VITAMINB12, FOLATE, FERRITIN, TIBC, IRON, RETICCTPCT in the last 72 hours. Sepsis Labs:  Recent Labs Lab 04/06/16 0033  LATICACIDVEN 3.7*    Recent Results (from the past 240 hour(s))  MRSA PCR Screening     Status: None   Collection Time: 04/06/16  1:20 AM  Result Value Ref Range Status   MRSA by PCR NEGATIVE NEGATIVE Final    Comment:        The GeneXpert MRSA Assay (FDA approved for NASAL specimens only), is one component of a comprehensive MRSA colonization surveillance program. It is not intended to diagnose MRSA infection nor to guide or monitor treatment for MRSA infections.          Radiology Studies: Ct Abdomen Pelvis W Contrast  04/06/2016  CLINICAL DATA:  Acute onset of hematemesis. Decreased hemoglobin. Initial encounter. EXAM: CT ABDOMEN AND PELVIS WITH CONTRAST TECHNIQUE: Multidetector CT imaging of the abdomen and pelvis was performed using the standard protocol following bolus administration of intravenous contrast. CONTRAST:  161mL ISOVUE-300 IOPAMIDOL (ISOVUE-300) INJECTION 61% COMPARISON:  CT of the abdomen and pelvis from 03/05/2016 FINDINGS: The visualized lung bases are clear. The slightly nodular contour of the liver is concerning for mild hepatic cirrhosis. The spleen is diminutive and grossly unremarkable. The patient is status post cholecystectomy, with clips noted at the gallbladder  fossa. The pancreas and adrenal glands are unremarkable. Esophageal varices are seen. There is mild recanalization of the umbilical vein. The kidneys are unremarkable in appearance. There is no evidence of hydronephrosis. No renal or ureteral stones are seen. Mild nonspecific perinephric stranding is noted bilaterally. No free fluid is identified. The small bowel is unremarkable in appearance. The stomach is within normal limits. No acute vascular abnormalities are seen. Calcification is seen at the proximal renal arteries bilaterally, and along the distal abdominal aorta and its branches. The appendix is normal in caliber and contains air, without evidence of appendicitis. The colon is grossly unremarkable in appearance. The bladder is relatively decompressed, likely explaining apparent bladder wall thickening. The patient is status post hysterectomy. No suspicious adnexal masses are seen. No inguinal lymphadenopathy is seen. No acute osseous abnormalities are identified. Prominent endplate irregularity is noted at L5-S1, similar in appearance to the prior study, though mildly worsened  from 2015. Multilevel vacuum phenomenon is noted along the lumbar spine, with disc space narrowing. IMPRESSION: 1. No acute abnormality seen to explain the patient's symptoms. 2. Suggestion of mild hepatic cirrhosis, with esophageal varices and mild recanalization of the umbilical vein. 3. Calcification at the proximal renal arteries bilaterally, and along the distal abdominal aorta and its branches. 4. Mildly worsened prominent degenerative change at L5-S1, with multilevel vacuum phenomenon along the lumbar spine. Electronically Signed   By: Garald Balding M.D.   On: 04/06/2016 04:03   Dg Abd 2 Views  04/06/2016  CLINICAL DATA:  Acute onset of lower abdominal pain, nausea and vomiting. Blood found in stool, urine and emesis. Initial encounter. EXAM: ABDOMEN - 2 VIEW COMPARISON:  CT of the abdomen and pelvis performed 03/05/2016  FINDINGS: The visualized bowel gas pattern is unremarkable. Scattered air and stool filled loops of colon are seen; no abnormal dilatation of small bowel loops is seen to suggest small bowel obstruction. No free intra-abdominal air is seen on the provided decubitus view. The visualized osseous structures are within normal limits; the sacroiliac joints are unremarkable in appearance. The visualized lung bases are essentially clear. Clips are noted within the right upper quadrant, reflecting prior cholecystectomy. IMPRESSION: Unremarkable bowel gas pattern; no free intra-abdominal air seen. Small amount of stool noted in the colon. Electronically Signed   By: Garald Balding M.D.   On: 04/06/2016 00:25        Scheduled Meds: . acetaminophen  650 mg Oral Once  . diphenhydrAMINE  25 mg Oral Once  . DULoxetine  60 mg Oral Daily  . folic acid  1 mg Intravenous Daily  . mometasone-formoterol  2 puff Inhalation BID  . montelukast  10 mg Oral QHS  . nicotine  21 mg Transdermal Daily  . PARoxetine  5 mg Oral Daily  . simvastatin  40 mg Oral q1800  . sodium chloride flush  3 mL Intravenous Q12H  . thiamine  100 mg Intravenous Daily   Continuous Infusions: . sodium chloride 100 mL/hr at 04/06/16 0249  . pantoprozole (PROTONIX) infusion 8 mg/hr (04/05/16 2355)     LOS: 1 day    Time spent: 35 minutes.     Elmarie Shiley, MD [facility] Pager [phone]  If 7PM-7AM, please contact night-coverage [email or web address] Password [facility] 04/06/2016, 7:16 AM

## 2016-04-07 ENCOUNTER — Encounter (HOSPITAL_COMMUNITY): Payer: Self-pay | Admitting: Gastroenterology

## 2016-04-07 ENCOUNTER — Encounter (HOSPITAL_COMMUNITY)
Admission: EM | Disposition: A | Discharge status: Home / Self Care | Payer: Self-pay | Source: Home / Self Care | Attending: Internal Medicine

## 2016-04-07 DIAGNOSIS — K259 Gastric ulcer, unspecified as acute or chronic, without hemorrhage or perforation: Secondary | ICD-10-CM

## 2016-04-07 DIAGNOSIS — K3189 Other diseases of stomach and duodenum: Secondary | ICD-10-CM

## 2016-04-07 DIAGNOSIS — I8500 Esophageal varices without bleeding: Secondary | ICD-10-CM

## 2016-04-07 HISTORY — PX: ESOPHAGOGASTRODUODENOSCOPY: SHX5428

## 2016-04-07 LAB — CBC
HCT: 21.4 % — ABNORMAL LOW (ref 36.0–46.0)
Hemoglobin: 7.1 g/dL — ABNORMAL LOW (ref 12.0–15.0)
MCH: 28.1 pg (ref 26.0–34.0)
MCHC: 33.2 g/dL (ref 30.0–36.0)
MCV: 84.6 fL (ref 78.0–100.0)
Platelets: 256 K/uL (ref 150–400)
RBC: 2.53 MIL/uL — ABNORMAL LOW (ref 3.87–5.11)
RDW: 17.2 % — ABNORMAL HIGH (ref 11.5–15.5)
WBC: 9.2 K/uL (ref 4.0–10.5)

## 2016-04-07 LAB — HEMOGLOBIN AND HEMATOCRIT, BLOOD
HCT: 27.7 % — ABNORMAL LOW (ref 36.0–46.0)
Hemoglobin: 9.1 g/dL — ABNORMAL LOW (ref 12.0–15.0)

## 2016-04-07 SURGERY — EGD (ESOPHAGOGASTRODUODENOSCOPY)
Anesthesia: Moderate Sedation

## 2016-04-07 MED ORDER — DIPHENHYDRAMINE HCL 50 MG/ML IJ SOLN
INTRAMUSCULAR | Status: DC | PRN
  Administered 2016-04-07: 25 mg via INTRAVENOUS

## 2016-04-07 MED ORDER — MAGNESIUM SULFATE 2 GM/50ML IV SOLN
2.0000 g | Freq: Once | INTRAVENOUS | Status: AC
Start: 2016-04-07 — End: 2016-04-07
  Administered 2016-04-07: 2 g via INTRAVENOUS
  Filled 2016-04-07: qty 50

## 2016-04-07 MED ORDER — SPOT INK MARKER SYRINGE KIT
PACK | SUBMUCOSAL | Status: AC
  Filled 2016-04-07: qty 5

## 2016-04-07 MED ORDER — MIDAZOLAM HCL 5 MG/ML IJ SOLN
INTRAMUSCULAR | Status: AC
  Filled 2016-04-07: qty 2

## 2016-04-07 MED ORDER — PANTOPRAZOLE SODIUM 40 MG PO TBEC
40.0000 mg | DELAYED_RELEASE_TABLET | Freq: Two times a day (BID) | ORAL | Status: DC
  Administered 2016-04-07 – 2016-04-10 (×7): 40 mg via ORAL
  Filled 2016-04-07 (×9): qty 1

## 2016-04-07 MED ORDER — MIDAZOLAM HCL 10 MG/2ML IJ SOLN
INTRAMUSCULAR | Status: DC | PRN
  Administered 2016-04-07: 1 mg via INTRAVENOUS
  Administered 2016-04-07 (×2): 2 mg via INTRAVENOUS

## 2016-04-07 MED ORDER — FENTANYL CITRATE (PF) 100 MCG/2ML IJ SOLN
INTRAMUSCULAR | Status: DC | PRN
  Administered 2016-04-07 (×2): 25 ug via INTRAVENOUS

## 2016-04-07 MED ORDER — SODIUM CHLORIDE 0.9 % IV SOLN: INTRAVENOUS | Status: DC

## 2016-04-07 MED ORDER — EPINEPHRINE HCL 0.1 MG/ML IJ SOSY
PREFILLED_SYRINGE | INTRAMUSCULAR | Status: AC
  Filled 2016-04-07: qty 10

## 2016-04-07 MED ORDER — SODIUM CHLORIDE 0.9 % IV SOLN
Freq: Once | INTRAVENOUS | Status: AC
  Administered 2016-04-07: 08:00:00 via INTRAVENOUS

## 2016-04-07 MED ORDER — ZOLPIDEM TARTRATE 5 MG PO TABS
5.0000 mg | ORAL_TABLET | Freq: Once | ORAL | Status: AC
  Administered 2016-04-07: 5 mg via ORAL
  Filled 2016-04-07: qty 1

## 2016-04-07 MED ORDER — FENTANYL CITRATE (PF) 100 MCG/2ML IJ SOLN
INTRAMUSCULAR | Status: AC
  Filled 2016-04-07: qty 4

## 2016-04-07 MED ORDER — BUTAMBEN-TETRACAINE-BENZOCAINE 2-2-14 % EX AERO
INHALATION_SPRAY | CUTANEOUS | Status: DC | PRN
  Administered 2016-04-07: 2 via TOPICAL

## 2016-04-07 MED ORDER — DIPHENHYDRAMINE HCL 50 MG/ML IJ SOLN
INTRAMUSCULAR | Status: AC
  Filled 2016-04-07: qty 1

## 2016-04-07 NOTE — Interval H&P Note (Signed)
History and Physical Interval Note:  04/07/2016 9:09 AM  [name]  has presented today for surgery, with the diagnosis of GI bleed  The various methods of treatment have been discussed with the patient and family. After consideration of risks, benefits and other options for treatment, the patient has consented to  Procedure(s): ESOPHAGOGASTRODUODENOSCOPY (EGD) (N/A) as a surgical intervention .  The patient's history has been reviewed, patient examined, no change in status, stable for surgery.  I have reviewed the patient's chart and labs.  Questions were answered to the patient's satisfaction.     Milus Banister

## 2016-04-07 NOTE — H&P (View-Only) (Signed)
Referring Provider:  Dr. Roel Cluck Primary Care Physician:  [name], [place] Primary Gastroenterologist:  Althia Forts  Reason for Consultation:  GI bleed  HPI: [name] is a 70 y.o. female with past medical history significant of alcohol abuse/cirrhosis and upper GI bleed in the past, chronic back pain, and asthma.  She apparently presented with dark blood and vomiting since 5 PM on 5/26 with 4 episodes of that, but she tells me today that she's not had any vomiting.  She reports melena for the past couple of days.  Denies NSAIDs and not on any anticoagulation.  Still drinking ETOH.  Is a poor historian, at least currently.  Patient had admission for significant hematemesis requiring blood transfusion in February 2017.  Dr. Paulita Fujita saw the patient in consult and performed EGD, which showed large gastric ulcers which were not bleeding.  Also had friable GE junction.  IN ER: Tachycardia to 105 blood pressure 103/73 WBC 11.6 hemoglobin 6.4 (down from 11.7 just one month ago) platelets 359 BUN 59 with creatinine 0.81 INR 1.29  Hemoccult positive Started on protonix gtt Started on IV Rocephin She was given 500 mL normal saline bolus and  typed & screened for blood administration--Received 2 units PRBCs with Hgb increased to 8.0 grams.  CT scan of the abdomen and pelvis with contrast showed the following:  IMPRESSION: 1. No acute abnormality seen to explain the patient's symptoms. 2. Suggestion of mild hepatic cirrhosis, with esophageal varices andmild recanalization of the umbilical vein. 3. Calcification at the proximal renal arteries bilaterally, and along the distal abdominal aorta and its branches. 4. Mildly worsened prominent degenerative change at L5-S1, with multilevel vacuum phenomenon along the lumbar spine.  Per nursing staff, she had one black/bloody BM overnight.  No further vomiting.  Past Medical History  Diagnosis Date  . Seasonal allergies   .  Hypercholesteremia     under control  . GERD (gastroesophageal reflux disease)     occasional  . Arthritis     back-severe, hips, right knee  . H/O mumps   . H/O measles   . Insomnia   . Asthma   . Allergy   . Renal cell carcinoma 2012    left  . Psoriasis (a type of skin inflammation)   . Alcohol abuse   . Alcohol abuse     Past Surgical History  Procedure Laterality Date  . Kidney surgery  12/2010    [facility]; partial nephrectomy  . Hernia repair  [id number]    supraumbilical repair  . Bunionectomy  04/2011  . Menisectomy  2010    left knee  . Cholecystectomy  11/13/2011    Procedure: LAPAROSCOPIC CHOLECYSTECTOMY WITH INTRAOPERATIVE CHOLANGIOGRAM;  Surgeon: Judieth Keens, DO;  Location: [facility];  Service: General;  Laterality: N/A;  . Esophagogastroduodenoscopy N/A 09/25/2013    Procedure: ESOPHAGOGASTRODUODENOSCOPY (EGD);  Surgeon: Lear Ng, MD;  Location: [facility];  Service: Endoscopy;  Laterality: N/A;  . Colonoscopy N/A 09/25/2013    Procedure: COLONOSCOPY;  Surgeon: Lear Ng, MD;  Location: [facility];  Service: Endoscopy;  Laterality: N/A;  . Abdominal hysterectomy  40years ago  . Tubal ligation  44 years ago  . Lumbar laminectomy/decompression microdiscectomy Right 10/12/2014    Procedure: HEMI LAMINECTOMY MICRODISCECTOMY L5-S1 RIGHT (1 LEVEL);  Surgeon: Tobi Bastos, MD;  Location: [facility];  Service: Orthopedics;  Laterality: Right;  . Esophagogastroduodenoscopy (egd) with propofol Left 01/03/2016    Procedure: ESOPHAGOGASTRODUODENOSCOPY (EGD) WITH PROPOFOL;  Surgeon: Arta Silence,  MD;  Location: [facility];  Service: Endoscopy;  Laterality: Left;    Prior to Admission medications   Medication Sig Start Date End Date Taking? Authorizing Provider  albuterol (PROVENTIL HFA;VENTOLIN HFA) 108 (90 Base) MCG/ACT inhaler Inhale 2 puffs into the lungs every 6 (six) hours as needed for wheezing or shortness of breath. 01/19/16  Yes Golden Circle, FNP  docusate sodium ([employer] STOOL SOFTENER) 100 MG capsule TAKE 1-2 CAPSULES BY MOUTH DAILY AS NEEDED FOR MILD CONSTIPATION. Patient taking differently: Take 100-200 mg by mouth daily as needed for mild constipation. TAKE 1-2 CAPSULES BY MOUTH DAILY AS NEEDED FOR MILD CONSTIPATION. 03/11/16  Yes Golden Circle, FNP  DULoxetine (CYMBALTA) 60 MG capsule Take 1 capsule (60 mg total) by mouth daily. 01/19/16  Yes Golden Circle, FNP  Fluticasone-Salmeterol (ADVAIR) 500-50 MCG/DOSE AEPB Inhale 1 puff into the lungs 2 (two) times daily. 01/19/16  Yes Golden Circle, FNP  folic acid (FOLVITE) 1 MG tablet Take 1 tablet (1 mg total) by mouth daily. 03/11/16  Yes Golden Circle, FNP  LORazepam (ATIVAN) 1 MG tablet Take 1 tablet (1 mg total) by mouth 2 (two) times daily as needed (Anxiety, shakiness. DO NOT USE WITH ALCOHOL.). Patient taking differently: Take 1 mg by mouth 2 (two) times daily as needed for anxiety (Anxiety, shakiness. DO NOT USE WITH ALCOHOL.).  01/19/16  Yes Golden Circle, FNP  Magnesium Oxide 400 (240 Mg) MG TABS Take 1 tablet by mouth daily. 01/19/16  Yes Golden Circle, FNP  methocarbamol (ROBAXIN) 500 MG tablet TAKE 1 TABLET BY MOUTH 3 TIMES A DAY AS NEEDED FOR MUSCLE SPASM Patient taking differently: Take 500 mg by mouth every 8 (eight) hours as needed for muscle spasms. TAKE 1 TABLET BY MOUTH 3 TIMES A DAY AS NEEDED FOR MUSCLE SPASM 03/11/16  Yes Golden Circle, FNP  montelukast (SINGULAIR) 10 MG tablet Take 1 tablet (10 mg total) by mouth at bedtime. 03/11/16  Yes Golden Circle, FNP  Multiple Vitamin (MULTIVITAMIN WITH MINERALS) TABS tablet Take 1 tablet by mouth daily. 01/05/16  Yes Christina P Rama, MD  pantoprazole (PROTONIX) 40 MG tablet Take 1 tablet (40 mg total) by mouth daily. Patient taking differently: Take 40 mg by mouth 2 (two) times daily.  01/05/16  Yes Venetia Maxon Rama, MD  PARoxetine (PAXIL) 10 MG tablet Take 0.5 tablets (5 mg total) by mouth daily. 03/11/16  Yes  Golden Circle, FNP  potassium chloride SA (KLOR-CON M20) 20 MEQ tablet Take 1 tablet (20 mEq total) by mouth 2 (two) times daily with a meal. 10/25/15  Yes Golden Circle, FNP  simvastatin (ZOCOR) 40 MG tablet Take 1 tablet (40 mg total) by mouth every morning. 01/19/16  Yes Golden Circle, FNP  sucralfate (CARAFATE) 1 g tablet Take 1 tablet (1 g total) by mouth 4 (four) times daily -  with meals and at bedtime. 03/05/16  Yes Sharlett Iles, MD  thiamine 100 MG tablet Take 1 tablet (100 mg total) by mouth daily. 01/05/16  Yes Venetia Maxon Rama, MD  traMADol (ULTRAM) 50 MG tablet Take 1 tablet (50 mg total) by mouth every 6 (six) hours as needed for moderate pain. 01/19/16  Yes Golden Circle, FNP  zolpidem (AMBIEN) 5 MG tablet TAKE 1 TABLET BY MOUTH EVERY DAY AT BEDTIME AS NEEDED FOR SLEEP Patient taking differently: Take 5 mg by mouth at bedtime as needed for sleep. TAKE 1 TABLET BY MOUTH EVERY  DAY AT BEDTIME AS NEEDED FOR SLEEP 03/11/16  Yes Golden Circle, FNP  nicotine (NICODERM CQ - DOSED IN MG/24 HOURS) 21 mg/24hr patch Place 1 patch (21 mg total) onto the skin daily. Patient not taking: Reported on 04/05/2016 01/05/16   Venetia Maxon Rama, MD  triamcinolone cream (KENALOG) 0.1 % Apply topically 2 (two) times daily. Patient not taking: Reported on 04/05/2016 01/05/16   Venetia Maxon Rama, MD    Current Facility-Administered Medications  Medication Dose Route Frequency Provider Last Rate Last Dose  . 0.9 %  sodium chloride infusion   Intravenous Continuous Belkys A Regalado, MD 100 mL/hr at 04/06/16 0730    . acetaminophen (TYLENOL) tablet 650 mg  650 mg Oral Q6H PRN Toy Baker, MD       Or  . acetaminophen (TYLENOL) suppository 650 mg  650 mg Rectal Q6H PRN Toy Baker, MD      . acetaminophen (TYLENOL) tablet 650 mg  650 mg Oral Once Toy Baker, MD   Stopped at 04/06/16 0130  . albuterol (PROVENTIL) (2.5 MG/3ML) 0.083% nebulizer solution 2.5 mg  2.5 mg  Nebulization Q6H PRN Toy Baker, MD      . cefTRIAXone (ROCEPHIN) 1 g in dextrose 5 % 50 mL IVPB  1 g Intravenous Q24H Belkys A Regalado, MD   1 g at 04/06/16 0800  . diphenhydrAMINE (BENADRYL) capsule 25 mg  25 mg Oral Once Toy Baker, MD   Stopped at 04/06/16 0130  . diphenhydrAMINE (BENADRYL) capsule 25 mg  25 mg Oral Q8H PRN Belkys A Regalado, MD   25 mg at 04/06/16 1017  . DULoxetine (CYMBALTA) DR capsule 60 mg  60 mg Oral Daily Toy Baker, MD   60 mg at 04/06/16 0910  . folic acid injection 1 mg  1 mg Intravenous Daily Toy Baker, MD   1 mg at 04/06/16 0947  . HYDROmorphone (DILAUDID) injection 0.5 mg  0.5 mg Intravenous Q3H PRN Ritta Slot, NP   0.5 mg at 04/06/16 0909  . LORazepam (ATIVAN) tablet 1 mg  1 mg Oral Q6H PRN Elmarie Shiley, MD       Or  . LORazepam (ATIVAN) injection 1 mg  1 mg Intravenous Q6H PRN Belkys A Regalado, MD   1 mg at 04/06/16 1017  . mometasone-formoterol (DULERA) 200-5 MCG/ACT inhaler 2 puff  2 puff Inhalation BID Toy Baker, MD   2 puff at 04/06/16 0855  . montelukast (SINGULAIR) tablet 10 mg  10 mg Oral QHS Toy Baker, MD   Stopped at 04/06/16 0130  . nicotine (NICODERM CQ - dosed in mg/24 hours) patch 21 mg  21 mg Transdermal Daily Toy Baker, MD   21 mg at 04/06/16 0909  . ondansetron (ZOFRAN) tablet 4 mg  4 mg Oral Q6H PRN Toy Baker, MD       Or  . ondansetron (ZOFRAN) injection 4 mg  4 mg Intravenous Q6H PRN Toy Baker, MD      . pantoprazole (PROTONIX) 80 mg in sodium chloride 0.9 % 250 mL (0.32 mg/mL) infusion  8 mg/hr Intravenous Continuous Carlisle Cater, PA-C 25 mL/hr at 04/05/16 2355 8 mg/hr at 04/05/16 2355  . PARoxetine (PAXIL) tablet 5 mg  5 mg Oral Daily Toy Baker, MD   5 mg at 04/06/16 0910  . sodium chloride flush (NS) 0.9 % injection 3 mL  3 mL Intravenous Q12H Toy Baker, MD   3 mL at 04/06/16 1000  . thiamine (B-1) injection 100 mg  100  mg Intravenous  Daily Toy Baker, MD   100 mg at 04/06/16 0908  . traMADol (ULTRAM) tablet 50 mg  50 mg Oral Q6H PRN Toy Baker, MD        Allergies as of 04/05/2016 - Review Complete 04/05/2016  Allergen Reaction Noted  . Azithromycin Itching and Swelling 10/17/2006    Family History  Problem Relation Age of Onset  . Hyperlipidemia Mother   . Hypertension Mother     Social History   Social History  . Marital Status: Divorced    Spouse Name: N/A  . Number of Children: 3  . Years of Education: 12   Occupational History  . Retired    Social History Main Topics  . Smoking status: Current Some Day Smoker -- 0.15 packs/day for 6 years    Types: Cigarettes  . Smokeless tobacco: Never Used  . Alcohol Use: 0.0 oz/week    0 Standard drinks or equivalent per week     Comment: socially  . Drug Use: No  . Sexual Activity: Yes   Other Topics Concern  . Not on file   Social History Narrative   Fun: play cards, play with her grandchildren, cooking   Denies religious beliefs effecting health care.     Review of Systems: Ten point ROS is O/W negative except as mentioned in HPI,  Physical Exam: Vital signs in last 24 hours: Temp:  [97.6 F (36.4 C)-98.3 F (36.8 C)] 98.1 F (36.7 C) (05/27 0800) Pulse Rate:  [94-117] 94 (05/27 0900) Resp:  [13-20] 17 (05/27 0900) BP: (86-117)/(35-73) 113/60 mmHg (05/27 0900) SpO2:  [97 %-100 %] 100 % (05/27 0900) Weight:  [150 lb (68.04 kg)] 150 lb (68.04 kg) (05/26 2141) Last BM Date: 04/06/16 General:  Alert, Well-developed, well-nourished, pleasant and cooperative in NAD although not a great historian. Head:  Normocephalic and atraumatic. Eyes:  Sclera clear, no icterus.  Conjunctiva pink. Ears:  Normal auditory acuity. Mouth:  No deformity or lesions.   Lungs:  Clear throughout to auscultation.  No wheezes, crackles, or rhonchi.  Heart:  Regular rate and rhythm; no murmurs, clicks, rubs, or gallops. Abdomen:  Soft, non-distended.   BS present.  Mild upper abdominal TTP.  Rectal:  Deferred.  Was heme positive.  Msk:  Symmetrical without gross deformities. Pulses:  Normal pulses noted. Extremities:  Without clubbing or edema. Neurologic:  Alert and oriented x 4;  grossly normal neurologically. Skin:  Intact without significant lesions or rashes. Psych:  Alert and cooperative. Normal mood and affect.  Lab Results:  Recent Labs  04/05/16 2200 04/06/16 0844  WBC 11.6* 12.3*  HGB 6.4* 8.0*  HCT 19.8* 24.2*  PLT 359 269   BMET  Recent Labs  04/05/16 2200  NA 137  K 3.9  CL 105  CO2 20*  GLUCOSE 139*  BUN 59*  CREATININE 0.81  CALCIUM 8.5*   LFT  Recent Labs  04/05/16 2200  PROT 5.8*  ALBUMIN 2.8*  AST 45*  ALT 24  ALKPHOS 154*  BILITOT 0.6   PT/INR  Recent Labs  04/05/16 2200  LABPROT 16.2*  INR 1.29   Studies/Results: Ct Abdomen Pelvis W Contrast  04/06/2016  CLINICAL DATA:  Acute onset of hematemesis. Decreased hemoglobin. Initial encounter. EXAM: CT ABDOMEN AND PELVIS WITH CONTRAST TECHNIQUE: Multidetector CT imaging of the abdomen and pelvis was performed using the standard protocol following bolus administration of intravenous contrast. CONTRAST:  173mL ISOVUE-300 IOPAMIDOL (ISOVUE-300) INJECTION 61% COMPARISON:  CT of the abdomen and  pelvis from 03/05/2016 FINDINGS: The visualized lung bases are clear. The slightly nodular contour of the liver is concerning for mild hepatic cirrhosis. The spleen is diminutive and grossly unremarkable. The patient is status post cholecystectomy, with clips noted at the gallbladder fossa. The pancreas and adrenal glands are unremarkable. Esophageal varices are seen. There is mild recanalization of the umbilical vein. The kidneys are unremarkable in appearance. There is no evidence of hydronephrosis. No renal or ureteral stones are seen. Mild nonspecific perinephric stranding is noted bilaterally. No free fluid is identified. The small bowel is unremarkable  in appearance. The stomach is within normal limits. No acute vascular abnormalities are seen. Calcification is seen at the proximal renal arteries bilaterally, and along the distal abdominal aorta and its branches. The appendix is normal in caliber and contains air, without evidence of appendicitis. The colon is grossly unremarkable in appearance. The bladder is relatively decompressed, likely explaining apparent bladder wall thickening. The patient is status post hysterectomy. No suspicious adnexal masses are seen. No inguinal lymphadenopathy is seen. No acute osseous abnormalities are identified. Prominent endplate irregularity is noted at L5-S1, similar in appearance to the prior study, though mildly worsened from 2015. Multilevel vacuum phenomenon is noted along the lumbar spine, with disc space narrowing. IMPRESSION: 1. No acute abnormality seen to explain the patient's symptoms. 2. Suggestion of mild hepatic cirrhosis, with esophageal varices and mild recanalization of the umbilical vein. 3. Calcification at the proximal renal arteries bilaterally, and along the distal abdominal aorta and its branches. 4. Mildly worsened prominent degenerative change at L5-S1, with multilevel vacuum phenomenon along the lumbar spine. Electronically Signed   By: Garald Balding M.D.   On: 04/06/2016 04:03   Dg Abd 2 Views  04/06/2016  CLINICAL DATA:  Acute onset of lower abdominal pain, nausea and vomiting. Blood found in stool, urine and emesis. Initial encounter. EXAM: ABDOMEN - 2 VIEW COMPARISON:  CT of the abdomen and pelvis performed 03/05/2016 FINDINGS: The visualized bowel gas pattern is unremarkable. Scattered air and stool filled loops of colon are seen; no abnormal dilatation of small bowel loops is seen to suggest small bowel obstruction. No free intra-abdominal air is seen on the provided decubitus view. The visualized osseous structures are within normal limits; the sacroiliac joints are unremarkable in  appearance. The visualized lung bases are essentially clear. Clips are noted within the right upper quadrant, reflecting prior cholecystectomy. IMPRESSION: Unremarkable bowel gas pattern; no free intra-abdominal air seen. Small amount of stool noted in the colon. Electronically Signed   By: Garald Balding M.D.   On: 04/06/2016 00:25   IMPRESSION:  -70 year old female with history of ulcers and ETOH cirrhosis who presented with hematemesis and melena.  She has varices by CT scan but none noted on last EGD 3 months ago.   -Acute blood loss anemia secondary to GI bleed:  Hgb was down 5 grams as compared to one month ago.  She has now received 2 units PRBC's.  BUN elevated. -ETOH abuse  PLAN: -Already on PPI gtt and Rocephin empirically.  Will add octreotide gtt as well. -EGD 5/28 or sooner if she re-bleeds acutely. -Monitor Hgb and transfuse further prn.   ZEHR, JESSICA D.  04/06/2016, 10:28 AM  Pager number [phone]    ________________________________________________________________________  Velora Heckler GI MD note:  I personally examined the patient, reviewed the data and agree with the assessment and plan described above.  Woman with alcoholic cirrhosis, melena and anemia. Very poor historian. EGD with  Dr. Paulita Fujita 3 months ago; gastric ulcers.  H. Pylori serology was negative.  She was discharged on once daily PPI and was to follow up with him in office but she never had that appointment.  Now with melena, acute drop in Hb.  I cannot tell that she was still on her PPI (not on her admitting med list) and she is frankly too poor a historian to know.  Given her cirrhosis on imaging, suggstion of esophageal varices by CT (not on 12/2015 EGD though) we're putting her on octreotide drip as well as IV PPI drip and plan on EGD tomorrow.  Owens Loffler, MD [facility] Pager [phone]

## 2016-04-07 NOTE — Op Note (Signed)
[facility] Patient Name: [name] Procedure Date: 04/07/2016 MRN: [id number] Attending MD: Milus Banister , MD Date of Birth: 05-25-1946 CSN: [id number] Age: 70 Admit Type: Inpatient Procedure:                Upper GI endoscopy Indications:              Heme positive stool, Melena Providers:                Milus Banister, MD, Kingsley Plan, RN,                            William Dalton, Technician Referring MD:              Medicines:                Fentanyl 50 micrograms IV, Midazolam 4 mg IV,                            Diphenhydramine 25 mg IV Complications:            No immediate complications. Estimated blood loss:                            None. Estimated Blood Loss:     Estimated blood loss: none. Procedure:                Pre-Anesthesia Assessment:                           - Prior to the procedure, a History and Physical                            was performed, and patient medications and                            allergies were reviewed. The patient's tolerance of                            previous anesthesia was also reviewed. The risks                            and benefits of the procedure and the sedation                            options and risks were discussed with the patient.                            All questions were answered, and informed consent                            was obtained. Prior Anticoagulants: The patient has                            taken no previous anticoagulant or antiplatelet  agents. ASA Grade Assessment: III - A patient with                            severe systemic disease. After reviewing the risks                            and benefits, the patient was deemed in                            satisfactory condition to undergo the procedure.                           After obtaining informed consent, the endoscope was                            passed under direct vision.  Throughout the                            procedure, the patient's blood pressure, pulse, and                            oxygen saturations were monitored continuously. The                            [id number]) scope was introduced through the                            mouth, and advanced to the second part of duodenum.                            The upper GI endoscopy was accomplished without                            difficulty. The patient tolerated the procedure                            well. Findings:      Small (< 5 mm), flat varices were found in the lower third of the       esophagus. There were no stigmata of recent bleeding.      Two non-bleeding cratered gastric ulcers with no stigmata of bleeding       were found in the gastric antrum. The largest lesion was 4 mm in largest       dimension. These ulcers appeared improved compared to Dr. Paulita Fujita       description, pictures from EGD 2-3 months ago.      Mild portal hypertensive gastropathy was found in the entire examined       stomach.      The examined duodenum was normal. There was no new or old blood in the       UGI tract. Impression:               - Small (< 5 mm) esophageal varices.                           -  Non-bleeding gastric ulcers with no stigmata of                            bleeding. These ulcers appeared improved compared                            to Dr. Paulita Fujita description, pictures from EGD 2-3                            months ago. Still presumed to be the source of                            recent melena, anemia.                           - Portal hypertensive gastropathy.                           - Normal examined duodenum.                           - No specimens collected. Moderate Sedation:      Moderate (conscious) sedation was administered by the endoscopy nurse       and supervised by the endoscopist. The following parameters were       monitored: oxygen saturation, heart rate, blood  pressure, and response       to care. Total physician intraservice time was 10 minutes. Recommendation:           - Return patient to hospital ward for ongoing care.                           - Resume previous diet. I will advance.                           - OK to stop the octreotide and change PPI from                            drip to twice daily, orallly. Otherwise continue                            present medications. PPI was NOT listed on her                            admitting meds. She needs to stay on twice daily                            oral PPI, probably indefinitely.                           - She needs to strictly avoid NSAIDs                           - Alcohol abuse is her biggest overall health  concern, really needs to stop.                           - My office will contact her about follow up appt                            in 6-8 weeks, from there she will be scheduled for                            repeat EGD. Of note, she failed to show for follow                            up with Dr. Paulita Fujita in nearly identical clinical                            situation just 2-3 months ago.                           - If no signs of rebleeding in next 24-36 hours she                            is safe for dischage home. Procedure Code(s):        --- Professional ---                           [id number], Esophagogastroduodenoscopy, flexible,                            transoral; diagnostic, including collection of                            specimen(s) by brushing or washing, when performed                            (separate procedure)                           99152, Moderate sedation services provided by the                            same physician or other qualified health care                            professional performing the diagnostic or                            therapeutic service that the sedation supports,                             requiring the presence of an independent trained                            observer to assist in the monitoring of the  patient's level of consciousness and physiological                            status; initial 15 minutes of intraservice time,                            patient age 68 years or older Diagnosis Code(s):        --- Professional ---                           I85.00, Esophageal varices without bleeding                           K25.9, Gastric ulcer, unspecified as acute or                            chronic, without hemorrhage or perforation                           K76.6, Portal hypertension                           K31.89, Other diseases of stomach and duodenum                           R19.5, Other fecal abnormalities                           K92.1, Melena (includes Hematochezia) CPT copyright 2016 [employer]. All rights reserved. The codes documented in this report are preliminary and upon coder review may  be revised to meet current compliance requirements. Milus Banister, MD 04/07/2016 10:01:51 AM This report has been signed electronically. Number of Addenda: 0

## 2016-04-07 NOTE — Progress Notes (Signed)
PROGRESS NOTE    [name]  [id number] DOB: 07/05/1946 DOA: 04/05/2016 PCP: [name], FNP   Brief Narrative:  [name] is a 70 y.o. female with medical history significant of alcohol abuse and upper GI bleed in the past, chronic back pain and asthma. Presented with dark blood and vomiting since 5 PM today have had at least 4 episodes. At first she had severe epigastric abdominal pain radiating to her back. He reports melena for the past 24 hours   Assessment & Plan:   Active Problems:   Asthma   Hematemesis   Alcohol abuse   Abdominal pain   Acute blood loss anemia   Acute upper GI bleeding   Upper GI bleed   Acute Blood loss Anemia; S/P post 2 units PRBC 5-27. Likely related to GI bleed.  IV Protonix, Octreotide. .  Transfuse another unit this am. Hb decreased to 7, report 2 Black BM since last night.   Acute upper GI bleed:  Start Ceftriaxone for SBP prophylaxis;  Hb pending for this am. If below 8 will provide blood transfusion.  IV protonix.  GI consulted. Plan for endoscopy today.  Started on Octreotide.  Transfuse one unit this AM. Repeat Hb tonight, transfusion if Hb less than 8  Mild Hepatic Cirrhosis and esophageal varices on CT scan. Patient aware of results. Encourage alcohol cessation.  Continue with ceftriaxone for SBP prophylaxis.  Stopped  statins.  INR at 1.29, not elevated.   Alcohol abuse: continue with CIWA protocol. Counseling provided to patient   Abdominal Pain. Related to gastritis, vs ulcer. IV protonix. CT abdomen no finding that could explain pain.   Lactic acidosis: suspect related to hypoperfusion.  Patient afebrile, no significant leukocytosis. Started on ceftriaxone to covers for SB prophylaxis. CT abdomen negative for infection.  UA negative. Lactic acid normalized. resolved.   Asthma; PRN albuterol.   Hypomagnesemia; replaced IV. Repeat labs in am.    DVT prophylaxis: SCD in setting of GI bleeding.  Code Status: Full  code.  Family Communication: care discussed with patient.  Disposition Plan: remain inpatient.    Consultants:   [facility]  Procedures:  none   Antimicrobials:   Ceftriaxone for SBP prophylaxis.    Subjective: Had 2 BM , black overnight  Asking when she can eat.  Objective: Filed Vitals:   04/07/16 0300 04/07/16 0400 04/07/16 0500 04/07/16 0600  BP: 112/63 133/73 111/69 131/74  Pulse: 59 90 89 85  Temp:   97.8 F (36.6 C)   TempSrc:   Oral   Resp: 14 19 16 18   Height:      Weight:      SpO2: 100% 96% 97% 97%    Intake/Output Summary (Last 24 hours) at 04/07/16 0746 Last data filed at 04/07/16 0600  Gross per 24 hour  Intake   1575 ml  Output   1350 ml  Net    225 ml   Filed Weights   04/05/16 2141  Weight: 68.04 kg (150 lb)    Examination:  General exam: Appears calm and comfortable  Respiratory system: Clear to auscultation. Respiratory effort normal. Cardiovascular system: S1 & S2 heard, RRR. No JVD, murmurs, rubs, gallops or clicks. No pedal edema. Gastrointestinal system: Abdomen is nondistended, soft and nontender. No organomegaly or masses felt. Normal bowel sounds heard. Central nervous system: Alert and oriented. No focal neurological deficits. Extremities: Symmetric 5 x 5 power. Skin: No rashes, lesions or ulcers Psychiatry: Judgement and insight appear normal.  Mood & affect appropriate.     Data Reviewed: I have personally reviewed following labs and imaging studies  CBC:  Recent Labs Lab 04/05/16 2200 04/06/16 0844 04/06/16 1830 04/07/16 0424  WBC 11.6* 12.3*  --  9.2  NEUTROABS 7.5 7.1  --   --   HGB 6.4* 8.0* 7.3* 7.1*  HCT 19.8* 24.2* 22.0* 21.4*  MCV 85.3 82.3  --  84.6  PLT 359 269  --  123456   Basic Metabolic Panel:  Recent Labs Lab 04/05/16 2200 04/06/16 0832 04/06/16 0844  NA 137 139  --   K 3.9 4.0  --   CL 105 110  --   CO2 20* 20*  --   GLUCOSE 139* 107*  --   BUN 59* 56*  --   CREATININE 0.81 0.78  --     CALCIUM 8.5* 7.9*  --   MG  --   --  1.5*  PHOS  --   --  3.9   GFR: Estimated Creatinine Clearance: 61.4 mL/min (by C-G formula based on Cr of 0.78). Liver Function Tests:  Recent Labs Lab 04/05/16 2200 04/06/16 0832  AST 45* 46*  ALT 24 22  ALKPHOS 154* 131*  BILITOT 0.6 0.9  PROT 5.8* 5.3*  ALBUMIN 2.8* 2.6*    Recent Labs Lab 04/06/16 0033  LIPASE 18   No results for input(s): AMMONIA in the last 168 hours. Coagulation Profile:  Recent Labs Lab 04/05/16 2200  INR 1.29   Cardiac Enzymes: No results for input(s): CKTOTAL, CKMB, CKMBINDEX, TROPONINI in the last 168 hours. BNP (last 3 results) No results for input(s): PROBNP in the last 8760 hours. HbA1C: No results for input(s): HGBA1C in the last 72 hours. CBG: No results for input(s): GLUCAP in the last 168 hours. Lipid Profile: No results for input(s): CHOL, HDL, LDLCALC, TRIG, CHOLHDL, LDLDIRECT in the last 72 hours. Thyroid Function Tests:  Recent Labs  04/06/16 0845  TSH 0.385   Anemia Panel: No results for input(s): VITAMINB12, FOLATE, FERRITIN, TIBC, IRON, RETICCTPCT in the last 72 hours. Sepsis Labs:  Recent Labs Lab 04/06/16 0033 04/06/16 0845  LATICACIDVEN 3.7* 0.9    Recent Results (from the past 240 hour(s))  MRSA PCR Screening     Status: None   Collection Time: 04/06/16  1:20 AM  Result Value Ref Range Status   MRSA by PCR NEGATIVE NEGATIVE Final    Comment:        The GeneXpert MRSA Assay (FDA approved for NASAL specimens only), is one component of a comprehensive MRSA colonization surveillance program. It is not intended to diagnose MRSA infection nor to guide or monitor treatment for MRSA infections.          Radiology Studies: Ct Abdomen Pelvis W Contrast  04/06/2016  CLINICAL DATA:  Acute onset of hematemesis. Decreased hemoglobin. Initial encounter. EXAM: CT ABDOMEN AND PELVIS WITH CONTRAST TECHNIQUE: Multidetector CT imaging of the abdomen and pelvis was  performed using the standard protocol following bolus administration of intravenous contrast. CONTRAST:  147mL ISOVUE-300 IOPAMIDOL (ISOVUE-300) INJECTION 61% COMPARISON:  CT of the abdomen and pelvis from 03/05/2016 FINDINGS: The visualized lung bases are clear. The slightly nodular contour of the liver is concerning for mild hepatic cirrhosis. The spleen is diminutive and grossly unremarkable. The patient is status post cholecystectomy, with clips noted at the gallbladder fossa. The pancreas and adrenal glands are unremarkable. Esophageal varices are seen. There is mild recanalization of the umbilical vein. The kidneys are unremarkable  in appearance. There is no evidence of hydronephrosis. No renal or ureteral stones are seen. Mild nonspecific perinephric stranding is noted bilaterally. No free fluid is identified. The small bowel is unremarkable in appearance. The stomach is within normal limits. No acute vascular abnormalities are seen. Calcification is seen at the proximal renal arteries bilaterally, and along the distal abdominal aorta and its branches. The appendix is normal in caliber and contains air, without evidence of appendicitis. The colon is grossly unremarkable in appearance. The bladder is relatively decompressed, likely explaining apparent bladder wall thickening. The patient is status post hysterectomy. No suspicious adnexal masses are seen. No inguinal lymphadenopathy is seen. No acute osseous abnormalities are identified. Prominent endplate irregularity is noted at L5-S1, similar in appearance to the prior study, though mildly worsened from 2015. Multilevel vacuum phenomenon is noted along the lumbar spine, with disc space narrowing. IMPRESSION: 1. No acute abnormality seen to explain the patient's symptoms. 2. Suggestion of mild hepatic cirrhosis, with esophageal varices and mild recanalization of the umbilical vein. 3. Calcification at the proximal renal arteries bilaterally, and along the  distal abdominal aorta and its branches. 4. Mildly worsened prominent degenerative change at L5-S1, with multilevel vacuum phenomenon along the lumbar spine. Electronically Signed   By: Garald Balding M.D.   On: 04/06/2016 04:03   Dg Abd 2 Views  04/06/2016  CLINICAL DATA:  Acute onset of lower abdominal pain, nausea and vomiting. Blood found in stool, urine and emesis. Initial encounter. EXAM: ABDOMEN - 2 VIEW COMPARISON:  CT of the abdomen and pelvis performed 03/05/2016 FINDINGS: The visualized bowel gas pattern is unremarkable. Scattered air and stool filled loops of colon are seen; no abnormal dilatation of small bowel loops is seen to suggest small bowel obstruction. No free intra-abdominal air is seen on the provided decubitus view. The visualized osseous structures are within normal limits; the sacroiliac joints are unremarkable in appearance. The visualized lung bases are essentially clear. Clips are noted within the right upper quadrant, reflecting prior cholecystectomy. IMPRESSION: Unremarkable bowel gas pattern; no free intra-abdominal air seen. Small amount of stool noted in the colon. Electronically Signed   By: Garald Balding M.D.   On: 04/06/2016 00:25        Scheduled Meds: . sodium chloride   Intravenous Once  . acetaminophen  650 mg Oral Once  . cefTRIAXone (ROCEPHIN)  IV  1 g Intravenous Q24H  . diphenhydrAMINE  25 mg Oral Once  . DULoxetine  60 mg Oral Daily  . folic acid  1 mg Intravenous Daily  . magnesium sulfate 1 - 4 g bolus IVPB  2 g Intravenous Once  . mometasone-formoterol  2 puff Inhalation BID  . montelukast  10 mg Oral QHS  . nicotine  21 mg Transdermal Daily  . PARoxetine  5 mg Oral Daily  . sodium chloride flush  3 mL Intravenous Q12H  . thiamine  100 mg Intravenous Daily   Continuous Infusions: . sodium chloride 100 mL/hr at 04/06/16 0730  . octreotide  (SANDOSTATIN)    IV infusion 50 mcg/hr (04/07/16 0444)  . pantoprozole (PROTONIX) infusion 8 mg/hr  (04/07/16 0444)     LOS: 2 days    Time spent: 35 minutes.     Elmarie Shiley, MD [facility] Pager [phone]  If 7PM-7AM, please contact night-coverage [email or web address] Password [facility] 04/07/2016, 7:46 AM

## 2016-04-08 ENCOUNTER — Encounter (HOSPITAL_COMMUNITY): Payer: Self-pay | Admitting: Gastroenterology

## 2016-04-08 LAB — CBC
HEMATOCRIT: 26.9 % — AB (ref 36.0–46.0)
Hemoglobin: 8.8 g/dL — ABNORMAL LOW (ref 12.0–15.0)
MCH: 27.8 pg (ref 26.0–34.0)
MCHC: 32.7 g/dL (ref 30.0–36.0)
MCV: 84.9 fL (ref 78.0–100.0)
Platelets: 273 10*3/uL (ref 150–400)
RBC: 3.17 MIL/uL — ABNORMAL LOW (ref 3.87–5.11)
RDW: 17.3 % — AB (ref 11.5–15.5)
WBC: 10.1 10*3/uL (ref 4.0–10.5)

## 2016-04-08 LAB — MAGNESIUM: MAGNESIUM: 1.6 mg/dL — AB (ref 1.7–2.4)

## 2016-04-08 LAB — COMPREHENSIVE METABOLIC PANEL
ALBUMIN: 2.7 g/dL — AB (ref 3.5–5.0)
ALT: 20 U/L (ref 14–54)
AST: 34 U/L (ref 15–41)
Alkaline Phosphatase: 135 U/L — ABNORMAL HIGH (ref 38–126)
Anion gap: 5 (ref 5–15)
BILIRUBIN TOTAL: 0.4 mg/dL (ref 0.3–1.2)
BUN: 11 mg/dL (ref 6–20)
CO2: 22 mmol/L (ref 22–32)
Calcium: 8.2 mg/dL — ABNORMAL LOW (ref 8.9–10.3)
Chloride: 111 mmol/L (ref 101–111)
Creatinine, Ser: 0.57 mg/dL (ref 0.44–1.00)
GFR calc Af Amer: >60 mL/min (ref >60)
Glucose, Bld: 113 mg/dL — ABNORMAL HIGH (ref 65–99)
POTASSIUM: 4 mmol/L (ref 3.5–5.1)
Sodium: 138 mmol/L (ref 135–145)
TOTAL PROTEIN: 5.7 g/dL — AB (ref 6.5–8.1)

## 2016-04-08 MED ORDER — ZOLPIDEM TARTRATE 5 MG PO TABS
5.0000 mg | ORAL_TABLET | Freq: Once | ORAL | Status: AC
  Administered 2016-04-08: 5 mg via ORAL
  Filled 2016-04-08: qty 1

## 2016-04-08 MED ORDER — HYDRALAZINE HCL 20 MG/ML IJ SOLN: 10.0000 mg | Freq: Four times a day (QID) | INTRAMUSCULAR | Status: DC | PRN

## 2016-04-08 MED ORDER — MAGNESIUM SULFATE 2 GM/50ML IV SOLN
2.0000 g | Freq: Once | INTRAVENOUS | Status: AC
  Administered 2016-04-08: 2 g via INTRAVENOUS
  Filled 2016-04-08: qty 50

## 2016-04-08 NOTE — Progress Notes (Signed)
PROGRESS NOTE    [name]  [id number] DOB: 02-10-1946 DOA: 04/05/2016 PCP: [name], FNP   Brief Narrative:  [name] is a 70 y.o. female with medical history significant of alcohol abuse and upper GI bleed in the past, chronic back pain and asthma. Presented with dark blood and vomiting since 5 PM today have had at least 4 episodes. At first she had severe epigastric abdominal pain radiating to her back. He reports melena for the past 24 hours   Assessment & Plan:   Active Problems:   Asthma   Hematemesis   Alcohol abuse   Abdominal pain   Acute blood loss anemia   Acute upper GI bleeding   Upper GI bleed   Acute Blood loss Anemia; secondary to 2 gastric ulcers.  S/P post 2 units PRBC 5-27. And one unit 5-28 Likely related to GI bleed.  Treated with IV Protonix, Octreotide. .  Hb stable. Repeat labs in am.   Acute upper GI bleed:  Endoscopy: 2 gastric ulcers, small flat varices. Portal gastropathy Continue with Ceftriaxone for SBP prophylaxis;  Hb pending for this am. If below 8 will provide blood transfusion.  Oral protonix.  Octreotide discontinue.    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 Patient aware of results. Encourage alcohol cessation.  Continue with ceftriaxone for SBP prophylaxis.  Stopped  statins.  INR at 1.29, not elevated.   Alcohol abuse: continue with CIWA protocol. Counseling provided to patient   Abdominal Pain. Related to  ulcer. IV protonix. CT abdomen no finding that could explain pain.  Protonix, tramadol/   Lactic acidosis: suspect related to hypoperfusion.  Patient afebrile, no significant leukocytosis. Started on ceftriaxone to covers for SB prophylaxis. CT abdomen negative for infection.  UA negative. Lactic acid normalized. resolved.   Asthma; PRN albuterol.   Hypomagnesemia; replaced IV. HTN; PRN hydralazine.   DVT prophylaxis: SCD in setting of GI bleeding.  Code Status: Full code.  Family  Communication: care discussed with patient.  Disposition Plan: remain inpatient.    Consultants:   [facility]  Procedures:  none   Antimicrobials:   Ceftriaxone for SBP prophylaxis.    Subjective: Brown stool this am.  Complaining of abdominal pain   Objective: Filed Vitals:   04/08/16 0600 04/08/16 0700 04/08/16 0800 04/08/16 0855  BP: 169/124  167/86   Pulse: 86 77 89   Temp:      TempSrc:      Resp: 19 19 18    Height:      Weight:      SpO2: 99% 100% 100% 100%    Intake/Output Summary (Last 24 hours) at 04/08/16 0945 Last data filed at 04/08/16 0900  Gross per 24 hour  Intake   4332 ml  Output    650 ml  Net   3682 ml   Filed Weights   04/05/16 2141  Weight: 68.04 kg (150 lb)    Examination:  General exam: Appears calm and comfortable  Respiratory system: Clear to auscultation. Respiratory effort normal. Cardiovascular system: S1 & S2 heard, RRR. No JVD, murmurs, rubs, gallops or clicks. No pedal edema. Gastrointestinal system: Abdomen is nondistended, soft and nontender. No organomegaly or masses felt. Normal bowel sounds heard. Central nervous system: Alert and oriented. No focal neurological deficits. Extremities: Symmetric 5 x 5 power. Skin: No rashes, lesions or ulcers Psychiatry: Judgement and insight appear normal. Mood & affect appropriate.     Data Reviewed: I have personally reviewed  following labs and imaging studies  CBC:  Recent Labs Lab 04/05/16 2200 04/06/16 0844 04/06/16 1830 04/07/16 0424 04/07/16 1832 04/08/16 0311  WBC 11.6* 12.3*  --  9.2  --  10.1  NEUTROABS 7.5 7.1  --   --   --   --   HGB 6.4* 8.0* 7.3* 7.1* 9.1* 8.8*  HCT 19.8* 24.2* 22.0* 21.4* 27.7* 26.9*  MCV 85.3 82.3  --  84.6  --  84.9  PLT 359 269  --  256  --  123456   Basic Metabolic Panel:  Recent Labs Lab 04/05/16 2200 04/06/16 0832 04/06/16 0844 04/08/16 0311  NA 137 139  --  138  K 3.9 4.0  --  4.0  CL 105 110  --  111  CO2 20* 20*  --  22    GLUCOSE 139* 107*  --  113*  BUN 59* 56*  --  11  CREATININE 0.81 0.78  --  0.57  CALCIUM 8.5* 7.9*  --  8.2*  MG  --   --  1.5* 1.6*  PHOS  --   --  3.9  --    GFR: Estimated Creatinine Clearance: 61.4 mL/min (by C-G formula based on Cr of 0.57). Liver Function Tests:  Recent Labs Lab 04/05/16 2200 04/06/16 0832 04/08/16 0311  AST 45* 46* 34  ALT 24 22 20   ALKPHOS 154* 131* 135*  BILITOT 0.6 0.9 0.4  PROT 5.8* 5.3* 5.7*  ALBUMIN 2.8* 2.6* 2.7*    Recent Labs Lab 04/06/16 0033  LIPASE 18   No results for input(s): AMMONIA in the last 168 hours. Coagulation Profile:  Recent Labs Lab 04/05/16 2200  INR 1.29   Cardiac Enzymes: No results for input(s): CKTOTAL, CKMB, CKMBINDEX, TROPONINI in the last 168 hours. BNP (last 3 results) No results for input(s): PROBNP in the last 8760 hours. HbA1C: No results for input(s): HGBA1C in the last 72 hours. CBG: No results for input(s): GLUCAP in the last 168 hours. Lipid Profile: No results for input(s): CHOL, HDL, LDLCALC, TRIG, CHOLHDL, LDLDIRECT in the last 72 hours. Thyroid Function Tests:  Recent Labs  04/06/16 0845  TSH 0.385   Anemia Panel: No results for input(s): VITAMINB12, FOLATE, FERRITIN, TIBC, IRON, RETICCTPCT in the last 72 hours. Sepsis Labs:  Recent Labs Lab 04/06/16 0033 04/06/16 0845  LATICACIDVEN 3.7* 0.9    Recent Results (from the past 240 hour(s))  MRSA PCR Screening     Status: None   Collection Time: 04/06/16  1:20 AM  Result Value Ref Range Status   MRSA by PCR NEGATIVE NEGATIVE Final    Comment:        The GeneXpert MRSA Assay (FDA approved for NASAL specimens only), is one component of a comprehensive MRSA colonization surveillance program. It is not intended to diagnose MRSA infection nor to guide or monitor treatment for MRSA infections.          Radiology Studies: No results found.      Scheduled Meds: . acetaminophen  650 mg Oral Once  . cefTRIAXone  (ROCEPHIN)  IV  1 g Intravenous Q24H  . diphenhydrAMINE  25 mg Oral Once  . DULoxetine  60 mg Oral Daily  . folic acid  1 mg Intravenous Daily  . magnesium sulfate 1 - 4 g bolus IVPB  2 g Intravenous Once  . mometasone-formoterol  2 puff Inhalation BID  . montelukast  10 mg Oral QHS  . nicotine  21 mg Transdermal Daily  .  pantoprazole  40 mg Oral BID AC  . PARoxetine  5 mg Oral Daily  . sodium chloride flush  3 mL Intravenous Q12H  . thiamine  100 mg Intravenous Daily   Continuous Infusions: . sodium chloride 100 mL/hr at 04/08/16 0900     LOS: 3 days    Time spent: 35 minutes.     Elmarie Shiley, MD [facility] Pager [phone]  If 7PM-7AM, please contact night-coverage [email or web address] Password [email or web address] 04/08/2016, 9:45 AM

## 2016-04-08 NOTE — Progress Notes (Signed)
Utilization review completed.  

## 2016-04-09 ENCOUNTER — Telehealth: Payer: Self-pay

## 2016-04-09 LAB — TYPE AND SCREEN
ABO/RH(D): O POS
Antibody Screen: NEGATIVE
UNIT DIVISION: 0
UNIT DIVISION: 0
Unit division: 0
Unit division: 0

## 2016-04-09 LAB — BASIC METABOLIC PANEL
Anion gap: 5 (ref 5–15)
BUN: 8 mg/dL (ref 6–20)
CALCIUM: 8.1 mg/dL — AB (ref 8.9–10.3)
CO2: 23 mmol/L (ref 22–32)
CREATININE: 0.52 mg/dL (ref 0.44–1.00)
Chloride: 111 mmol/L (ref 101–111)
GFR calc Af Amer: >60 mL/min (ref >60)
Glucose, Bld: 125 mg/dL — ABNORMAL HIGH (ref 65–99)
POTASSIUM: 3.9 mmol/L (ref 3.5–5.1)
SODIUM: 139 mmol/L (ref 135–145)

## 2016-04-09 LAB — CBC
HEMATOCRIT: 24.7 % — AB (ref 36.0–46.0)
HEMOGLOBIN: 8 g/dL — AB (ref 12.0–15.0)
MCH: 28.4 pg (ref 26.0–34.0)
MCHC: 32.4 g/dL (ref 30.0–36.0)
MCV: 87.6 fL (ref 78.0–100.0)
Platelets: 282 10*3/uL (ref 150–400)
RBC: 2.82 MIL/uL — AB (ref 3.87–5.11)
RDW: 17.6 % — AB (ref 11.5–15.5)
WBC: 13.2 10*3/uL — AB (ref 4.0–10.5)

## 2016-04-09 LAB — MAGNESIUM: MAGNESIUM: 1.6 mg/dL — AB (ref 1.7–2.4)

## 2016-04-09 MED ORDER — LORAZEPAM 1 MG PO TABS
1.0000 mg | ORAL_TABLET | Freq: Four times a day (QID) | ORAL | Status: DC | PRN
  Administered 2016-04-09 – 2016-04-10 (×3): 1 mg via ORAL
  Filled 2016-04-09 (×3): qty 1

## 2016-04-09 MED ORDER — VITAMIN B-1 100 MG PO TABS
100.0000 mg | ORAL_TABLET | Freq: Every day | ORAL | Status: DC
  Administered 2016-04-09 – 2016-04-10 (×2): 100 mg via ORAL
  Filled 2016-04-09 (×2): qty 1

## 2016-04-09 MED ORDER — LORAZEPAM 2 MG/ML IJ SOLN
1.0000 mg | Freq: Four times a day (QID) | INTRAMUSCULAR | Status: DC | PRN
  Administered 2016-04-09: 1 mg via INTRAVENOUS
  Filled 2016-04-09: qty 1

## 2016-04-09 MED ORDER — FLUCONAZOLE 150 MG PO TABS
150.0000 mg | ORAL_TABLET | Freq: Once | ORAL | Status: AC
  Administered 2016-04-09: 150 mg via ORAL
  Filled 2016-04-09: qty 1

## 2016-04-09 MED ORDER — ZOLPIDEM TARTRATE 5 MG PO TABS
5.0000 mg | ORAL_TABLET | Freq: Every evening | ORAL | Status: DC | PRN
  Administered 2016-04-09: 5 mg via ORAL
  Filled 2016-04-09: qty 1

## 2016-04-09 MED ORDER — MAGNESIUM SULFATE 2 GM/50ML IV SOLN
2.0000 g | Freq: Once | INTRAVENOUS | Status: AC
  Administered 2016-04-09: 2 g via INTRAVENOUS
  Filled 2016-04-09: qty 50

## 2016-04-09 MED ORDER — FOLIC ACID 1 MG PO TABS
1.0000 mg | ORAL_TABLET | Freq: Every day | ORAL | Status: DC
  Administered 2016-04-09 – 2016-04-10 (×2): 1 mg via ORAL
  Filled 2016-04-09 (×2): qty 1

## 2016-04-09 NOTE — Telephone Encounter (Signed)
appt made and letter sent to pt via My Chart

## 2016-04-09 NOTE — Progress Notes (Signed)
Pharmacy IV to PO conversion  The patient is receiving Thiamine and Folic Acid by the intravenous route.  Based on criteria approved by the Pharmacy and [place], the medication is being converted to the equivalent oral dose form.   Documented ability to take oral medications for > 24 hr  Plan to continue treatment for at least 1 day  If you have any questions about this conversion, please contact the Pharmacy Department (ext [phone]).  Thank you.   Lindell Spar, PharmD, BCPS Pager: [phone] 04/09/2016 7:31 AM

## 2016-04-09 NOTE — Progress Notes (Addendum)
PROGRESS NOTE    [name]  [id number] DOB: 1946-11-01 DOA: 04/05/2016 PCP: [name], FNP   Brief Narrative:  [name] is a 70 y.o. female with medical history significant of alcohol abuse and upper GI bleed in the past, chronic back pain and asthma. Presented with dark blood and vomiting since 5 PM today have had at least 4 episodes. At first she had severe epigastric abdominal pain radiating to her back. He reports melena for the past 24 hours.   Patient admitted with acute blood loss anemia, secondary to upper GI bleed. She was treated with IV fluids, IV Protonix, octreotide, . GI was consulted. Patient underwent endoscopy on 5-28 which showed 2 gastric ulcers which are thought to be the cause  of the GI bleed. Patient was also noticed to have small esophageal varices. She will remain in the hospital to monitor blood count.  Assessment & Plan:   Active Problems:   Asthma   Hematemesis   Alcohol abuse   Abdominal pain   Acute blood loss anemia   Acute upper GI bleeding   Upper GI bleed   Acute Blood loss Anemia; secondary to 2 gastric ulcers.  S/P post 2 units PRBC 5-27. And one unit 5-28 Likely related to GI bleed.  Treated with IV Protonix, Octreotide. .  Hb decreased to 8, repeat labs in am.   Acute upper GI bleed:  Endoscopy: 2 gastric ulcers, small flat varices. Portal gastropathy Continue with Ceftriaxone for SBP prophylaxis;  Hb pending for this am. If below 8 will provide blood transfusion.  Oral protonix.  Octreotide discontinue.   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 Patient aware of results. Encourage alcohol cessation.  Continue with ceftriaxone for SBP prophylaxis.  Stopped  statins.  INR at 1.29, not elevated.   Alcohol abuse: continue with CIWA protocol. Counseling provided to patient  Feels nervoous , anxious. Will resume CIWA again to monitor  .  Abdominal Pain. Related to  ulcer. IV protonix. CT abdomen no finding  that could explain pain.  Protonix, tramadol/   Lactic acidosis: suspect related to hypoperfusion.  Patient afebrile, no significant leukocytosis. Started on ceftriaxone to covers for SB prophylaxis. CT abdomen negative for infection.  UA negative. Lactic acid normalized. resolved.   Asthma; PRN albuterol.   Hypomagnesemia; replaced IV. HTN; PRN hydralazine.  Vaginal Yeast infection; Diflucan one time dose.  Leukocytosis; will follow trend. On IV ceftriaxone.  Hypomagnesemia; replete IV  DVT prophylaxis: SCD in setting of GI bleeding.  Code Status: Full code.  Family Communication: care discussed with patient.  Disposition Plan: remain inpatient.    Consultants:   [facility]  Procedures:  none   Antimicrobials:   Ceftriaxone for SBP prophylaxis.    Subjective: Having Bowel movement every time she eats. Mild abdominal pain   Objective: Filed Vitals:   04/08/16 1126 04/08/16 1300 04/08/16 2007 04/08/16 2126  BP: 138/73 127/65  151/80  Pulse: 91 99  103  Temp: 98.4 F (36.9 C) 98.9 F (37.2 C)  99.4 F (37.4 C)  TempSrc: Oral Oral  Oral  Resp: 20 20  20   Height:      Weight:      SpO2: 100% 100% 97% 100%    Intake/Output Summary (Last 24 hours) at 04/09/16 1334 Last data filed at 04/08/16 1712  Gross per 24 hour  Intake    120 ml  Output      0 ml  Net  120 ml   Filed Weights   04/05/16 2141  Weight: 68.04 kg (150 lb)    Examination:  General exam: Appears calm and comfortable  Respiratory system: Clear to auscultation. Respiratory effort normal. Cardiovascular system: S1 & S2 heard, RRR. No JVD, murmurs, rubs, gallops or clicks. No pedal edema. Gastrointestinal system: Abdomen is nondistended, soft and nontender. No organomegaly or masses felt. Normal bowel sounds heard. Central nervous system: Alert and oriented. No focal neurological deficits. Extremities: Symmetric 5 x 5 power. Skin: No rashes, lesions or ulcers Psychiatry: Judgement and  insight appear normal. Mood & affect appropriate.     Data Reviewed: I have personally reviewed following labs and imaging studies  CBC:  Recent Labs Lab 04/05/16 2200 04/06/16 0844 04/06/16 1830 04/07/16 0424 04/07/16 1832 04/08/16 0311 04/09/16 0459  WBC 11.6* 12.3*  --  9.2  --  10.1 13.2*  NEUTROABS 7.5 7.1  --   --   --   --   --   HGB 6.4* 8.0* 7.3* 7.1* 9.1* 8.8* 8.0*  HCT 19.8* 24.2* 22.0* 21.4* 27.7* 26.9* 24.7*  MCV 85.3 82.3  --  84.6  --  84.9 87.6  PLT 359 269  --  256  --  273 [id number]   Basic Metabolic Panel:  Recent Labs Lab 04/05/16 2200 04/06/16 0832 04/06/16 0844 04/08/16 0311 04/09/16 0459  NA 137 139  --  138 139  K 3.9 4.0  --  4.0 3.9  CL 105 110  --  111 111  CO2 20* 20*  --  22 23  GLUCOSE 139* 107*  --  113* 125*  BUN 59* 56*  --  11 8  CREATININE 0.81 0.78  --  0.57 0.52  CALCIUM 8.5* 7.9*  --  8.2* 8.1*  MG  --   --  1.5* 1.6* 1.6*  PHOS  --   --  3.9  --   --    GFR: Estimated Creatinine Clearance: 61.4 mL/min (by C-G formula based on Cr of 0.52). Liver Function Tests:  Recent Labs Lab 04/05/16 2200 04/06/16 0832 04/08/16 0311  AST 45* 46* 34  ALT 24 22 20   ALKPHOS 154* 131* 135*  BILITOT 0.6 0.9 0.4  PROT 5.8* 5.3* 5.7*  ALBUMIN 2.8* 2.6* 2.7*    Recent Labs Lab 04/06/16 0033  LIPASE 18   No results for input(s): AMMONIA in the last 168 hours. Coagulation Profile:  Recent Labs Lab 04/05/16 2200  INR 1.29   Cardiac Enzymes: No results for input(s): CKTOTAL, CKMB, CKMBINDEX, TROPONINI in the last 168 hours. BNP (last 3 results) No results for input(s): PROBNP in the last 8760 hours. HbA1C: No results for input(s): HGBA1C in the last 72 hours. CBG: No results for input(s): GLUCAP in the last 168 hours. Lipid Profile: No results for input(s): CHOL, HDL, LDLCALC, TRIG, CHOLHDL, LDLDIRECT in the last 72 hours. Thyroid Function Tests: No results for input(s): TSH, T4TOTAL, FREET4, T3FREE, THYROIDAB in the last 72  hours. Anemia Panel: No results for input(s): VITAMINB12, FOLATE, FERRITIN, TIBC, IRON, RETICCTPCT in the last 72 hours. Sepsis Labs:  Recent Labs Lab 04/06/16 0033 04/06/16 0845  LATICACIDVEN 3.7* 0.9    Recent Results (from the past 240 hour(s))  MRSA PCR Screening     Status: None   Collection Time: 04/06/16  1:20 AM  Result Value Ref Range Status   MRSA by PCR NEGATIVE NEGATIVE Final    Comment:        The GeneXpert MRSA Assay (  FDA approved for NASAL specimens only), is one component of a comprehensive MRSA colonization surveillance program. It is not intended to diagnose MRSA infection nor to guide or monitor treatment for MRSA infections.          Radiology Studies: No results found.      Scheduled Meds: . cefTRIAXone (ROCEPHIN)  IV  1 g Intravenous Q24H  . DULoxetine  60 mg Oral Daily  . folic acid  1 mg Oral Daily  . mometasone-formoterol  2 puff Inhalation BID  . montelukast  10 mg Oral QHS  . nicotine  21 mg Transdermal Daily  . pantoprazole  40 mg Oral BID AC  . PARoxetine  5 mg Oral Daily  . sodium chloride flush  3 mL Intravenous Q12H  . thiamine  100 mg Oral Daily   Continuous Infusions: . sodium chloride 100 mL/hr at 04/08/16 1621     LOS: 4 days    Time spent: 25 minutes.     Elmarie Shiley, MD [facility] Pager [phone]  If 7PM-7AM, please contact night-coverage [email or web address] Password [email or web address] 04/09/2016, 1:34 PM

## 2016-04-09 NOTE — Telephone Encounter (Signed)
-----   Message from Milus Banister, MD sent at 04/07/2016 10:04 AM EDT ----- She needs rov with me in 6-8 weeks  thanks

## 2016-04-10 DIAGNOSIS — D62 Acute posthemorrhagic anemia: Principal | ICD-10-CM

## 2016-04-10 DIAGNOSIS — E785 Hyperlipidemia, unspecified: Secondary | ICD-10-CM

## 2016-04-10 DIAGNOSIS — F1010 Alcohol abuse, uncomplicated: Secondary | ICD-10-CM

## 2016-04-10 LAB — HEMOGLOBIN: Hemoglobin: 9.2 g/dL — ABNORMAL LOW (ref 12.0–15.0)

## 2016-04-10 MED ORDER — ZOLPIDEM TARTRATE 5 MG PO TABS: 5.0000 mg | ORAL_TABLET | Freq: Every evening | ORAL | Status: DC | PRN

## 2016-04-10 MED ORDER — SIMVASTATIN 40 MG PO TABS: 40.0000 mg | ORAL_TABLET | Freq: Every morning | ORAL | Status: DC

## 2016-04-10 MED ORDER — FLUTICASONE-SALMETEROL 500-50 MCG/DOSE IN AEPB: 1.0000 | INHALATION_SPRAY | Freq: Two times a day (BID) | RESPIRATORY_TRACT | Status: DC

## 2016-04-10 MED ORDER — FOLIC ACID 1 MG PO TABS: 1.0000 mg | ORAL_TABLET | Freq: Every day | ORAL | Status: DC

## 2016-04-10 MED ORDER — LORAZEPAM 1 MG PO TABS: 1.0000 mg | ORAL_TABLET | Freq: Two times a day (BID) | ORAL | Status: DC | PRN

## 2016-04-10 MED ORDER — THIAMINE HCL 100 MG PO TABS: 100.0000 mg | ORAL_TABLET | Freq: Every day | ORAL | Status: DC

## 2016-04-10 MED ORDER — PANTOPRAZOLE SODIUM 40 MG PO TBEC: 40.0000 mg | DELAYED_RELEASE_TABLET | Freq: Two times a day (BID) | ORAL | Status: DC

## 2016-04-10 MED ORDER — MAGNESIUM OXIDE -MG SUPPLEMENT 400 (240 MG) MG PO TABS: 1.0000 | ORAL_TABLET | Freq: Every day | ORAL | Status: DC

## 2016-04-10 MED ORDER — DULOXETINE HCL 60 MG PO CPEP: 60.0000 mg | ORAL_CAPSULE | Freq: Every day | ORAL | Status: DC

## 2016-04-10 MED ORDER — PAROXETINE HCL 10 MG PO TABS: 5.0000 mg | ORAL_TABLET | Freq: Every day | ORAL | Status: DC

## 2016-04-10 MED ORDER — DOCUSATE SODIUM 100 MG PO CAPS: 100.0000 mg | ORAL_CAPSULE | Freq: Every day | ORAL | Status: DC | PRN

## 2016-04-10 NOTE — Discharge Instructions (Signed)
Anemia, Nonspecific Anemia is a condition in which the concentration of red blood cells or hemoglobin in the blood is below normal. Hemoglobin is a substance in red blood cells that carries oxygen to the tissues of the body. Anemia results in not enough oxygen reaching these tissues.  CAUSES  Common causes of anemia include:   Excessive bleeding. Bleeding may be internal or external. This includes excessive bleeding from periods (in women) or from the intestine.   Poor nutrition.   Chronic kidney, thyroid, and liver disease.  Bone marrow disorders that decrease red blood cell production.  Cancer and treatments for cancer.  HIV, AIDS, and their treatments.  Spleen problems that increase red blood cell destruction.  Blood disorders.  Excess destruction of red blood cells due to infection, medicines, and autoimmune disorders. SIGNS AND SYMPTOMS   Minor weakness.   Dizziness.   Headache.  Palpitations.   Shortness of breath, especially with exercise.   Paleness.  Cold sensitivity.  Indigestion.  Nausea.  Difficulty sleeping.  Difficulty concentrating. Symptoms may occur suddenly or they may develop slowly.  DIAGNOSIS  Additional blood tests are often needed. These help your health care provider determine the best treatment. Your health care provider will check your stool for blood and look for other causes of blood loss.  TREATMENT  Treatment varies depending on the cause of the anemia. Treatment can include:   Supplements of iron, vitamin B12, or folic acid.   Hormone medicines.   A blood transfusion. This may be needed if blood loss is severe.   Hospitalization. This may be needed if there is significant continual blood loss.   Dietary changes.  Spleen removal. HOME CARE INSTRUCTIONS Keep all follow-up appointments. It often takes many weeks to correct anemia, and having your health care provider check on your condition and your response to  treatment is very important. SEEK IMMEDIATE MEDICAL CARE IF:   You develop extreme weakness, shortness of breath, or chest pain.   You become dizzy or have trouble concentrating.  You develop heavy vaginal bleeding.   You develop a rash.   You have bloody or black, tarry stools.   You faint.   You vomit up blood.   You vomit repeatedly.   You have abdominal pain.  You have a fever or persistent symptoms for more than 2-3 days.   You have a fever and your symptoms suddenly get worse.   You are dehydrated.  MAKE SURE YOU:  Understand these instructions.  Will watch your condition.  Will get help right away if you are not doing well or get worse.   This information is not intended to replace advice given to you by your health care provider. Make sure you discuss any questions you have with your health care provider.   Document Released: 12/05/2004 Document Revised: 06/30/2013 Document Reviewed: 04/23/2013 Elsevier Interactive Patient Education 2016 [employer]  

## 2016-04-10 NOTE — Accreditation Note (Signed)
Discharge instructions to patient questions asked and answered D Mateo Flow RN

## 2016-04-10 NOTE — Discharge Summary (Signed)
Physician Discharge Summary  [name] [id number] DOB: 29-May-1946 DOA: 04/05/2016  PCP: [name], FNP  Admit date: 04/05/2016 Discharge date: 04/10/2016  Recommendations for Outpatient Follow-up:  1. Hemoglobin is stable on discharge, 9.2. No changes in medications and discharge.  Discharge Diagnoses:  Active Problems:   Asthma   Hematemesis   Alcohol abuse   Abdominal pain   Acute blood loss anemia   Acute upper GI bleeding   Upper GI bleed    Discharge Condition: stable   Diet recommendation: as tolerated   History of present illness:  Per brief narrative 04/09/2016 "70 y.o. female with medical history significant of alcohol abuse and upper GI bleed in the past, chronic back pain and asthma. Presented with dark blood and vomiting since 5 PM today have had at least 4 episodes. At first she had severe epigastric abdominal pain radiating to her back. He reports melena for the past 24 hours.   Patient admitted with acute blood loss anemia, secondary to upper GI bleed. She was treated with IV fluids, IV Protonix, octreotide, . GI was consulted. Patient underwent endoscopy on 5-28 which showed 2 gastric ulcers which are thought to be the cause of the GI bleed. Patient was also noticed to have small esophageal varices."  Hospital Course:  Assessment & Plan:  Acute Blood loss Anemia / acute upper GI bleed - Secondary to 2 gastric ulcers seen on endoscopy. EGD also showed portal gastropathy - Status post 2 units of PRBC transfusion, 1 unit 04/06/2016 and another unit 04/07/2016 - Continue PPI therapy - Hemoglobin this morning 9.2, stable - She was on octreotide drip but now this is discontinued.  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  - Encourage alcohol cessation - She was on ceftriaxone for SBP prophylaxis for 6 days. There is no fever, no abdominal pain so we'll stop Rocephin. - Continue Protonix  Dyslipidemia - Continue statin therapy -  AST and ALT are within normal limits  Alcohol abuse - Patient was on CIWA protocol.  - Counseling on alcohol abuse cessation  Abdominal Pain.  - No acute findings and CT abdomen, likely related to gastric ulcers - Pain significantly better this morning - Tolerates regular diet  Lactic acidosis - Likely related to liver cirrhosis, history of alcohol use - Was on Rocephin for 6 days for SBP prophylaxis  Hypomagnesemia - 2 to alcohol use. Supplemented   DVT prophylaxis: SCD in setting of GI bleeding.  Code Status: Full code.  Family Communication: care discussed with patient.     Consultants:   [facility]  Procedures:  EGD  Antimicrobials:  Ceftriaxone for SBP prophylaxis. Has received it for 6 days    Signed:  Leisa Lenz, MD  [facility] 04/10/2016, 10:35 AM  Pager #: [phone]  Time spent in minutes: more than 30 minutes   Discharge Exam: Filed Vitals:   04/09/16 2131 04/10/16 0539  BP: 147/69 152/80  Pulse: 100 89  Temp: 98.2 F (36.8 C) 98.6 F (37 C)  Resp: 20 20   Filed Vitals:   04/09/16 1950 04/09/16 2131 04/10/16 0539 04/10/16 0839  BP:  147/69 152/80   Pulse:  100 89   Temp:  98.2 F (36.8 C) 98.6 F (37 C)   TempSrc:  Oral Oral   Resp:  20 20   Height:      Weight:      SpO2: 98% 100% 100% 98%    General: Pt is alert, follows commands appropriately,  not in acute distress Cardiovascular: Regular rate and rhythm, S1/S2 +, no murmurs Respiratory: Clear to auscultation bilaterally, no wheezing, no crackles, no rhonchi Abdominal: Soft, non tender, non distended, bowel sounds +, no guarding Extremities: no edema, no cyanosis, pulses palpable bilaterally DP and PT Neuro: Grossly nonfocal  Discharge Instructions  Discharge Instructions    Call MD for:  difficulty breathing, headache or visual disturbances    Complete by:  As directed      Call MD for:  persistant dizziness or light-headedness    Complete by:  As directed       Call MD for:  persistant nausea and vomiting    Complete by:  As directed      Call MD for:  severe uncontrolled pain    Complete by:  As directed      Diet - low sodium heart healthy    Complete by:  As directed      Discharge instructions    Complete by:  As directed   Hemoglobin is 9.2 prior to discharge.     Increase activity slowly    Complete by:  As directed              Medication List    STOP taking these medications        methocarbamol 500 MG tablet  Commonly known as:  ROBAXIN     triamcinolone cream 0.1 %  Commonly known as:  KENALOG      TAKE these medications        albuterol 108 (90 Base) MCG/ACT inhaler  Commonly known as:  PROVENTIL HFA;VENTOLIN HFA  Inhale 2 puffs into the lungs every 6 (six) hours as needed for wheezing or shortness of breath.     docusate sodium 100 MG capsule  Commonly known as:  [employer] STOOL SOFTENER  Take 1 capsule (100 mg total) by mouth daily as needed for mild constipation.     DULoxetine 60 MG capsule  Commonly known as:  CYMBALTA  Take 1 capsule (60 mg total) by mouth daily.     Fluticasone-Salmeterol 500-50 MCG/DOSE Aepb  Commonly known as:  ADVAIR  Inhale 1 puff into the lungs 2 (two) times daily.     folic acid 1 MG tablet  Commonly known as:  FOLVITE  Take 1 tablet (1 mg total) by mouth daily.     LORazepam 1 MG tablet  Commonly known as:  ATIVAN  Take 1 tablet (1 mg total) by mouth 2 (two) times daily as needed for anxiety (Anxiety, shakiness. DO NOT USE WITH ALCOHOL.).     Magnesium Oxide 400 (240 Mg) MG Tabs  Take 1 tablet by mouth daily.     montelukast 10 MG tablet  Commonly known as:  SINGULAIR  Take 1 tablet (10 mg total) by mouth at bedtime.     multivitamin with minerals Tabs tablet  Take 1 tablet by mouth daily.     nicotine 21 mg/24hr patch  Commonly known as:  NICODERM CQ - dosed in mg/24 hours  Place 1 patch (21 mg total) onto the skin daily.     pantoprazole 40 MG tablet  Commonly  known as:  PROTONIX  Take 1 tablet (40 mg total) by mouth 2 (two) times daily.     PARoxetine 10 MG tablet  Commonly known as:  PAXIL  Take 0.5 tablets (5 mg total) by mouth daily.     potassium chloride SA 20 MEQ tablet  Commonly known as:  KLOR-CON M20  Take 1 tablet (20 mEq total) by mouth 2 (two) times daily with a meal.     simvastatin 40 MG tablet  Commonly known as:  ZOCOR  Take 1 tablet (40 mg total) by mouth every morning.     sucralfate 1 g tablet  Commonly known as:  CARAFATE  Take 1 tablet (1 g total) by mouth 4 (four) times daily -  with meals and at bedtime.     thiamine 100 MG tablet  Take 1 tablet (100 mg total) by mouth daily.     thiamine 100 MG tablet  Take 1 tablet (100 mg total) by mouth daily.     traMADol 50 MG tablet  Commonly known as:  ULTRAM  Take 1 tablet (50 mg total) by mouth every 6 (six) hours as needed for moderate pain.     zolpidem 5 MG tablet  Commonly known as:  AMBIEN  Take 1 tablet (5 mg total) by mouth at bedtime as needed for sleep.            Follow-up Information    Follow up with [name], [place]. Schedule an appointment as soon as possible for a visit in 1 week.   Specialty:  Family Medicine   Why:  Follow up appt after recent hospitalization   Contact information:   [place] [address] [phone]        The results of significant diagnostics from this hospitalization (including imaging, microbiology, ancillary and laboratory) are listed below for reference.    Significant Diagnostic Studies: Ct Abdomen Pelvis W Contrast  04/06/2016  CLINICAL DATA:  Acute onset of hematemesis. Decreased hemoglobin. Initial encounter. EXAM: CT ABDOMEN AND PELVIS WITH CONTRAST TECHNIQUE: Multidetector CT imaging of the abdomen and pelvis was performed using the standard protocol following bolus administration of intravenous contrast. CONTRAST:  177mL ISOVUE-300 IOPAMIDOL (ISOVUE-300) INJECTION 61% COMPARISON:  CT of  the abdomen and pelvis from 03/05/2016 FINDINGS: The visualized lung bases are clear. The slightly nodular contour of the liver is concerning for mild hepatic cirrhosis. The spleen is diminutive and grossly unremarkable. The patient is status post cholecystectomy, with clips noted at the gallbladder fossa. The pancreas and adrenal glands are unremarkable. Esophageal varices are seen. There is mild recanalization of the umbilical vein. The kidneys are unremarkable in appearance. There is no evidence of hydronephrosis. No renal or ureteral stones are seen. Mild nonspecific perinephric stranding is noted bilaterally. No free fluid is identified. The small bowel is unremarkable in appearance. The stomach is within normal limits. No acute vascular abnormalities are seen. Calcification is seen at the proximal renal arteries bilaterally, and along the distal abdominal aorta and its branches. The appendix is normal in caliber and contains air, without evidence of appendicitis. The colon is grossly unremarkable in appearance. The bladder is relatively decompressed, likely explaining apparent bladder wall thickening. The patient is status post hysterectomy. No suspicious adnexal masses are seen. No inguinal lymphadenopathy is seen. No acute osseous abnormalities are identified. Prominent endplate irregularity is noted at L5-S1, similar in appearance to the prior study, though mildly worsened from 2015. Multilevel vacuum phenomenon is noted along the lumbar spine, with disc space narrowing. IMPRESSION: 1. No acute abnormality seen to explain the patient's symptoms. 2. Suggestion of mild hepatic cirrhosis, with esophageal varices and mild recanalization of the umbilical vein. 3. Calcification at the proximal renal arteries bilaterally, and along the distal abdominal aorta and its branches. 4. Mildly worsened prominent degenerative change at L5-S1, with  multilevel vacuum phenomenon along the lumbar spine. Electronically Signed    By: Garald Balding M.D.   On: 04/06/2016 04:03   Dg Abd 2 Views  04/06/2016  CLINICAL DATA:  Acute onset of lower abdominal pain, nausea and vomiting. Blood found in stool, urine and emesis. Initial encounter. EXAM: ABDOMEN - 2 VIEW COMPARISON:  CT of the abdomen and pelvis performed 03/05/2016 FINDINGS: The visualized bowel gas pattern is unremarkable. Scattered air and stool filled loops of colon are seen; no abnormal dilatation of small bowel loops is seen to suggest small bowel obstruction. No free intra-abdominal air is seen on the provided decubitus view. The visualized osseous structures are within normal limits; the sacroiliac joints are unremarkable in appearance. The visualized lung bases are essentially clear. Clips are noted within the right upper quadrant, reflecting prior cholecystectomy. IMPRESSION: Unremarkable bowel gas pattern; no free intra-abdominal air seen. Small amount of stool noted in the colon. Electronically Signed   By: Garald Balding M.D.   On: 04/06/2016 00:25    Microbiology: Recent Results (from the past 240 hour(s))  MRSA PCR Screening     Status: None   Collection Time: 04/06/16  1:20 AM  Result Value Ref Range Status   MRSA by PCR NEGATIVE NEGATIVE Final    Comment:        The GeneXpert MRSA Assay (FDA approved for NASAL specimens only), is one component of a comprehensive MRSA colonization surveillance program. It is not intended to diagnose MRSA infection nor to guide or monitor treatment for MRSA infections.      Labs: Basic Metabolic Panel:  Recent Labs Lab 04/05/16 2200 04/06/16 0832 04/06/16 0844 04/08/16 0311 04/09/16 0459  NA 137 139  --  138 139  K 3.9 4.0  --  4.0 3.9  CL 105 110  --  111 111  CO2 20* 20*  --  22 23  GLUCOSE 139* 107*  --  113* 125*  BUN 59* 56*  --  11 8  CREATININE 0.81 0.78  --  0.57 0.52  CALCIUM 8.5* 7.9*  --  8.2* 8.1*  MG  --   --  1.5* 1.6* 1.6*  PHOS  --   --  3.9  --   --    Liver Function  Tests:  Recent Labs Lab 04/05/16 2200 04/06/16 0832 04/08/16 0311  AST 45* 46* 34  ALT 24 22 20   ALKPHOS 154* 131* 135*  BILITOT 0.6 0.9 0.4  PROT 5.8* 5.3* 5.7*  ALBUMIN 2.8* 2.6* 2.7*    Recent Labs Lab 04/06/16 0033  LIPASE 18   No results for input(s): AMMONIA in the last 168 hours. CBC:  Recent Labs Lab 04/05/16 2200 04/06/16 0844 04/06/16 1830 04/07/16 0424 04/07/16 1832 04/08/16 0311 04/09/16 0459 04/10/16 0951  WBC 11.6* 12.3*  --  9.2  --  10.1 13.2*  --   NEUTROABS 7.5 7.1  --   --   --   --   --   --   HGB 6.4* 8.0* 7.3* 7.1* 9.1* 8.8* 8.0* 9.2*  HCT 19.8* 24.2* 22.0* 21.4* 27.7* 26.9* 24.7*  --   MCV 85.3 82.3  --  84.6  --  84.9 87.6  --   PLT 359 269  --  256  --  273 282  --    Cardiac Enzymes: No results for input(s): CKTOTAL, CKMB, CKMBINDEX, TROPONINI in the last 168 hours. BNP: BNP (last 3 results) No results for input(s): BNP in the last 8760  hours.  ProBNP (last 3 results) No results for input(s): PROBNP in the last 8760 hours.  CBG: No results for input(s): GLUCAP in the last 168 hours.

## 2016-04-10 NOTE — Care Management Important Message (Signed)
Important Message  Patient Details  Name: [name] MRN: [id number] Date of Birth: 04-01-1946   Medicare Important Message Given:  Yes    Camillo Flaming 04/10/2016, 10:21 AMImportant Message  Patient Details  Name: [name] MRN: [id number] Date of Birth: [birth year]   Medicare Important Message Given:  Yes    Camillo Flaming 04/10/2016, 10:21 AM

## 2016-04-11 ENCOUNTER — Telehealth: Payer: Self-pay | Admitting: *Deleted

## 2016-04-11 NOTE — Telephone Encounter (Signed)
Called pt to set TCM appt no answer LMOM RTC to make hosp f/u...[name]

## 2016-04-12 NOTE — Telephone Encounter (Signed)
Called pt again still no answer LMOM RTC.../lmb 

## 2016-04-13 ENCOUNTER — Inpatient Hospital Stay (HOSPITAL_COMMUNITY): Payer: Medicare HMO

## 2016-04-13 ENCOUNTER — Encounter (HOSPITAL_COMMUNITY): Payer: Self-pay | Admitting: Emergency Medicine

## 2016-04-13 ENCOUNTER — Inpatient Hospital Stay (HOSPITAL_COMMUNITY)
Admission: EM | Admit: 2016-04-13 | Discharge: 2016-04-18 | DRG: 481 | Disposition: A | Discharge status: Skilled Nursing Facility | Payer: Medicare HMO | Attending: Internal Medicine | Admitting: Internal Medicine

## 2016-04-13 ENCOUNTER — Emergency Department (HOSPITAL_COMMUNITY): Payer: Medicare HMO

## 2016-04-13 DIAGNOSIS — G8929 Other chronic pain: Secondary | ICD-10-CM | POA: Diagnosis present

## 2016-04-13 DIAGNOSIS — M7989 Other specified soft tissue disorders: Secondary | ICD-10-CM | POA: Diagnosis not present

## 2016-04-13 DIAGNOSIS — R609 Edema, unspecified: Secondary | ICD-10-CM

## 2016-04-13 DIAGNOSIS — J302 Other seasonal allergic rhinitis: Secondary | ICD-10-CM

## 2016-04-13 DIAGNOSIS — Y92009 Unspecified place in unspecified non-institutional (private) residence as the place of occurrence of the external cause: Secondary | ICD-10-CM

## 2016-04-13 DIAGNOSIS — S72142A Displaced intertrochanteric fracture of left femur, initial encounter for closed fracture: Secondary | ICD-10-CM | POA: Diagnosis not present

## 2016-04-13 DIAGNOSIS — Z8249 Family history of ischemic heart disease and other diseases of the circulatory system: Secondary | ICD-10-CM | POA: Diagnosis not present

## 2016-04-13 DIAGNOSIS — D62 Acute posthemorrhagic anemia: Secondary | ICD-10-CM | POA: Diagnosis present

## 2016-04-13 DIAGNOSIS — S7292XA Unspecified fracture of left femur, initial encounter for closed fracture: Secondary | ICD-10-CM

## 2016-04-13 DIAGNOSIS — F1010 Alcohol abuse, uncomplicated: Secondary | ICD-10-CM | POA: Diagnosis present

## 2016-04-13 DIAGNOSIS — Z79899 Other long term (current) drug therapy: Secondary | ICD-10-CM

## 2016-04-13 DIAGNOSIS — I2510 Atherosclerotic heart disease of native coronary artery without angina pectoris: Secondary | ICD-10-CM | POA: Diagnosis present

## 2016-04-13 DIAGNOSIS — J45909 Unspecified asthma, uncomplicated: Secondary | ICD-10-CM | POA: Diagnosis present

## 2016-04-13 DIAGNOSIS — E7800 Pure hypercholesterolemia, unspecified: Secondary | ICD-10-CM | POA: Diagnosis present

## 2016-04-13 DIAGNOSIS — Z885 Allergy status to narcotic agent status: Secondary | ICD-10-CM | POA: Diagnosis not present

## 2016-04-13 DIAGNOSIS — Z90710 Acquired absence of both cervix and uterus: Secondary | ICD-10-CM

## 2016-04-13 DIAGNOSIS — Z881 Allergy status to other antibiotic agents status: Secondary | ICD-10-CM

## 2016-04-13 DIAGNOSIS — K219 Gastro-esophageal reflux disease without esophagitis: Secondary | ICD-10-CM | POA: Diagnosis present

## 2016-04-13 DIAGNOSIS — D72829 Elevated white blood cell count, unspecified: Secondary | ICD-10-CM | POA: Diagnosis present

## 2016-04-13 DIAGNOSIS — M549 Dorsalgia, unspecified: Secondary | ICD-10-CM | POA: Diagnosis present

## 2016-04-13 DIAGNOSIS — K3189 Other diseases of stomach and duodenum: Secondary | ICD-10-CM | POA: Diagnosis present

## 2016-04-13 DIAGNOSIS — F17210 Nicotine dependence, cigarettes, uncomplicated: Secondary | ICD-10-CM | POA: Diagnosis present

## 2016-04-13 DIAGNOSIS — K7030 Alcoholic cirrhosis of liver without ascites: Secondary | ICD-10-CM | POA: Diagnosis present

## 2016-04-13 DIAGNOSIS — D649 Anemia, unspecified: Secondary | ICD-10-CM

## 2016-04-13 DIAGNOSIS — K259 Gastric ulcer, unspecified as acute or chronic, without hemorrhage or perforation: Secondary | ICD-10-CM | POA: Diagnosis present

## 2016-04-13 DIAGNOSIS — I8500 Esophageal varices without bleeding: Secondary | ICD-10-CM | POA: Diagnosis present

## 2016-04-13 DIAGNOSIS — W109XXA Fall (on) (from) unspecified stairs and steps, initial encounter: Secondary | ICD-10-CM | POA: Diagnosis present

## 2016-04-13 DIAGNOSIS — F329 Major depressive disorder, single episode, unspecified: Secondary | ICD-10-CM | POA: Diagnosis present

## 2016-04-13 DIAGNOSIS — S72332A Displaced oblique fracture of shaft of left femur, initial encounter for closed fracture: Principal | ICD-10-CM | POA: Diagnosis present

## 2016-04-13 DIAGNOSIS — Z9049 Acquired absence of other specified parts of digestive tract: Secondary | ICD-10-CM | POA: Diagnosis not present

## 2016-04-13 DIAGNOSIS — Z85528 Personal history of other malignant neoplasm of kidney: Secondary | ICD-10-CM | POA: Diagnosis not present

## 2016-04-13 DIAGNOSIS — J4530 Mild persistent asthma, uncomplicated: Secondary | ICD-10-CM | POA: Diagnosis not present

## 2016-04-13 DIAGNOSIS — D638 Anemia in other chronic diseases classified elsewhere: Secondary | ICD-10-CM | POA: Diagnosis present

## 2016-04-13 DIAGNOSIS — S72002A Fracture of unspecified part of neck of left femur, initial encounter for closed fracture: Secondary | ICD-10-CM

## 2016-04-13 DIAGNOSIS — S72452A Displaced supracondylar fracture without intracondylar extension of lower end of left femur, initial encounter for closed fracture: Secondary | ICD-10-CM

## 2016-04-13 DIAGNOSIS — M25552 Pain in left hip: Secondary | ICD-10-CM | POA: Diagnosis present

## 2016-04-13 DIAGNOSIS — W19XXXA Unspecified fall, initial encounter: Secondary | ICD-10-CM

## 2016-04-13 LAB — PROTIME-INR
INR: 1.09 (ref 0.00–1.49)
PROTHROMBIN TIME: 13.9 s (ref 11.6–15.2)

## 2016-04-13 LAB — HEMOGLOBIN AND HEMATOCRIT, BLOOD
HCT: 22.8 % — ABNORMAL LOW (ref 36.0–46.0)
Hemoglobin: 7.3 g/dL — ABNORMAL LOW (ref 12.0–15.0)

## 2016-04-13 LAB — CBC WITH DIFFERENTIAL/PLATELET
BASOS PCT: 0 %
Basophils Absolute: 0 10*3/uL (ref 0.0–0.1)
EOS ABS: 0.3 10*3/uL (ref 0.0–0.7)
EOS PCT: 3 %
HCT: 24.8 % — ABNORMAL LOW (ref 36.0–46.0)
Hemoglobin: 7.9 g/dL — ABNORMAL LOW (ref 12.0–15.0)
Lymphocytes Relative: 28 %
Lymphs Abs: 2.7 10*3/uL (ref 0.7–4.0)
MCH: 28.3 pg (ref 26.0–34.0)
MCHC: 31.9 g/dL (ref 30.0–36.0)
MCV: 88.9 fL (ref 78.0–100.0)
MONO ABS: 1 10*3/uL (ref 0.1–1.0)
MONOS PCT: 10 %
Neutro Abs: 5.8 10*3/uL (ref 1.7–7.7)
Neutrophils Relative %: 59 %
Platelets: 431 10*3/uL — ABNORMAL HIGH (ref 150–400)
RBC: 2.79 MIL/uL — ABNORMAL LOW (ref 3.87–5.11)
RDW: 18.5 % — ABNORMAL HIGH (ref 11.5–15.5)
WBC: 9.8 10*3/uL (ref 4.0–10.5)

## 2016-04-13 LAB — BASIC METABOLIC PANEL
Anion gap: 8 (ref 5–15)
BUN: 13 mg/dL (ref 6–20)
CHLORIDE: 106 mmol/L (ref 101–111)
CO2: 25 mmol/L (ref 22–32)
CREATININE: 0.61 mg/dL (ref 0.44–1.00)
Calcium: 8.3 mg/dL — ABNORMAL LOW (ref 8.9–10.3)
GFR calc non Af Amer: >60 mL/min (ref >60)
Glucose, Bld: 122 mg/dL — ABNORMAL HIGH (ref 65–99)
Potassium: 3.5 mmol/L (ref 3.5–5.1)
SODIUM: 139 mmol/L (ref 135–145)

## 2016-04-13 LAB — URINALYSIS, ROUTINE W REFLEX MICROSCOPIC
Bilirubin Urine: NEGATIVE
GLUCOSE, UA: NEGATIVE mg/dL
Hgb urine dipstick: NEGATIVE
Ketones, ur: NEGATIVE mg/dL
LEUKOCYTES UA: NEGATIVE
Nitrite: NEGATIVE
PH: 6.5 (ref 5.0–8.0)
Protein, ur: NEGATIVE mg/dL
SPECIFIC GRAVITY, URINE: 1.019 (ref 1.005–1.030)

## 2016-04-13 LAB — SURGICAL PCR SCREEN
MRSA, PCR: NEGATIVE
Staphylococcus aureus: NEGATIVE

## 2016-04-13 LAB — PREPARE RBC (CROSSMATCH)

## 2016-04-13 MED ORDER — POLYETHYLENE GLYCOL 3350 17 G PO PACK: 17.0000 g | PACK | Freq: Every day | ORAL | Status: DC | PRN

## 2016-04-13 MED ORDER — DOCUSATE SODIUM 100 MG PO CAPS: 100.0000 mg | ORAL_CAPSULE | Freq: Every day | ORAL | Status: DC | PRN

## 2016-04-13 MED ORDER — BISACODYL 10 MG RE SUPP: 10.0000 mg | Freq: Every day | RECTAL | Status: DC | PRN

## 2016-04-13 MED ORDER — ADULT MULTIVITAMIN W/MINERALS CH
1.0000 | ORAL_TABLET | Freq: Every day | ORAL | Status: DC
  Administered 2016-04-13 – 2016-04-18 (×5): 1 via ORAL
  Filled 2016-04-13 (×6): qty 1

## 2016-04-13 MED ORDER — SODIUM CHLORIDE 0.9 % IV SOLN: Freq: Once | INTRAVENOUS | Status: DC

## 2016-04-13 MED ORDER — FUROSEMIDE 10 MG/ML IJ SOLN
10.0000 mg | Freq: Once | INTRAMUSCULAR | Status: AC
  Administered 2016-04-13: 10 mg via INTRAVENOUS
  Filled 2016-04-13: qty 2

## 2016-04-13 MED ORDER — LORAZEPAM 1 MG PO TABS
1.0000 mg | ORAL_TABLET | Freq: Four times a day (QID) | ORAL | Status: DC | PRN
  Administered 2016-04-13 – 2016-04-15 (×2): 1 mg via ORAL
  Filled 2016-04-13 (×2): qty 1

## 2016-04-13 MED ORDER — DULOXETINE HCL 60 MG PO CPEP
60.0000 mg | ORAL_CAPSULE | Freq: Every day | ORAL | Status: DC
  Administered 2016-04-13 – 2016-04-18 (×5): 60 mg via ORAL
  Filled 2016-04-13 (×6): qty 1

## 2016-04-13 MED ORDER — HYDROMORPHONE HCL 1 MG/ML IJ SOLN
1.0000 mg | Freq: Once | INTRAMUSCULAR | Status: AC
  Administered 2016-04-13: 1 mg via INTRAVENOUS
  Filled 2016-04-13: qty 1

## 2016-04-13 MED ORDER — HYDROCODONE-ACETAMINOPHEN 5-325 MG PO TABS
1.0000 | ORAL_TABLET | Freq: Four times a day (QID) | ORAL | Status: DC | PRN
  Administered 2016-04-13 – 2016-04-15 (×6): 2 via ORAL
  Filled 2016-04-13 (×6): qty 2

## 2016-04-13 MED ORDER — VITAMIN B-1 100 MG PO TABS
100.0000 mg | ORAL_TABLET | Freq: Every day | ORAL | Status: DC
  Administered 2016-04-13 – 2016-04-18 (×5): 100 mg via ORAL
  Filled 2016-04-13 (×6): qty 1

## 2016-04-13 MED ORDER — METHOCARBAMOL 1000 MG/10ML IJ SOLN
500.0000 mg | Freq: Three times a day (TID) | INTRAVENOUS | Status: AC | PRN
  Administered 2016-04-13 (×3): 500 mg via INTRAVENOUS
  Filled 2016-04-13: qty 5
  Filled 2016-04-13 (×2): qty 550

## 2016-04-13 MED ORDER — MOMETASONE FURO-FORMOTEROL FUM 200-5 MCG/ACT IN AERO
2.0000 | INHALATION_SPRAY | Freq: Two times a day (BID) | RESPIRATORY_TRACT | Status: DC
Start: 2016-04-13 — End: 2016-04-18
  Administered 2016-04-13 – 2016-04-18 (×9): 2 via RESPIRATORY_TRACT
  Filled 2016-04-13: qty 8.8

## 2016-04-13 MED ORDER — LORAZEPAM 2 MG/ML IJ SOLN: 1.0000 mg | Freq: Once | INTRAMUSCULAR | Status: DC

## 2016-04-13 MED ORDER — ACETAMINOPHEN 325 MG PO TABS
650.0000 mg | ORAL_TABLET | Freq: Once | ORAL | Status: AC
  Administered 2016-04-13: 650 mg via ORAL
  Filled 2016-04-13: qty 2

## 2016-04-13 MED ORDER — PAROXETINE HCL 10 MG PO TABS
5.0000 mg | ORAL_TABLET | Freq: Every day | ORAL | Status: DC
  Administered 2016-04-13 – 2016-04-18 (×5): 5 mg via ORAL
  Filled 2016-04-13 (×6): qty 0.5

## 2016-04-13 MED ORDER — SODIUM CHLORIDE 0.9 % IV SOLN
Freq: Once | INTRAVENOUS | Status: AC
  Administered 2016-04-13: 14:00:00 via INTRAVENOUS

## 2016-04-13 MED ORDER — SUCRALFATE 1 G PO TABS
1.0000 g | ORAL_TABLET | Freq: Three times a day (TID) | ORAL | Status: DC
  Administered 2016-04-13 – 2016-04-18 (×20): 1 g via ORAL
  Filled 2016-04-13 (×27): qty 1

## 2016-04-13 MED ORDER — ZOLPIDEM TARTRATE 5 MG PO TABS
5.0000 mg | ORAL_TABLET | Freq: Every evening | ORAL | Status: DC | PRN
  Administered 2016-04-13 – 2016-04-17 (×4): 5 mg via ORAL
  Filled 2016-04-13 (×4): qty 1

## 2016-04-13 MED ORDER — MONTELUKAST SODIUM 10 MG PO TABS
10.0000 mg | ORAL_TABLET | Freq: Every day | ORAL | Status: DC
  Administered 2016-04-13 – 2016-04-17 (×5): 10 mg via ORAL
  Filled 2016-04-13 (×7): qty 1

## 2016-04-13 MED ORDER — ALBUTEROL SULFATE (2.5 MG/3ML) 0.083% IN NEBU: 3.0000 mL | INHALATION_SOLUTION | Freq: Four times a day (QID) | RESPIRATORY_TRACT | Status: DC | PRN

## 2016-04-13 MED ORDER — LORAZEPAM 2 MG/ML IJ SOLN
1.0000 mg | Freq: Four times a day (QID) | INTRAMUSCULAR | Status: DC | PRN
  Administered 2016-04-15: 1 mg via INTRAVENOUS
  Filled 2016-04-13: qty 1

## 2016-04-13 MED ORDER — SODIUM CHLORIDE 0.9 % IV SOLN
INTRAVENOUS | Status: DC
  Administered 2016-04-13 – 2016-04-16 (×7): via INTRAVENOUS

## 2016-04-13 MED ORDER — NICOTINE 21 MG/24HR TD PT24
21.0000 mg | MEDICATED_PATCH | Freq: Every day | TRANSDERMAL | Status: DC
  Administered 2016-04-17 – 2016-04-18 (×2): 21 mg via TRANSDERMAL
  Filled 2016-04-13 (×6): qty 1

## 2016-04-13 MED ORDER — SIMVASTATIN 40 MG PO TABS
40.0000 mg | ORAL_TABLET | Freq: Every morning | ORAL | Status: DC
  Administered 2016-04-13 – 2016-04-18 (×5): 40 mg via ORAL
  Filled 2016-04-13 (×6): qty 1

## 2016-04-13 MED ORDER — PANTOPRAZOLE SODIUM 40 MG PO TBEC
40.0000 mg | DELAYED_RELEASE_TABLET | Freq: Two times a day (BID) | ORAL | Status: DC
  Administered 2016-04-13 – 2016-04-18 (×9): 40 mg via ORAL
  Filled 2016-04-13 (×10): qty 1

## 2016-04-13 MED ORDER — POTASSIUM CHLORIDE CRYS ER 20 MEQ PO TBCR
20.0000 meq | EXTENDED_RELEASE_TABLET | Freq: Two times a day (BID) | ORAL | Status: DC
  Administered 2016-04-13 – 2016-04-18 (×9): 20 meq via ORAL
  Filled 2016-04-13 (×10): qty 1

## 2016-04-13 MED ORDER — MORPHINE SULFATE (PF) 2 MG/ML IV SOLN
0.5000 mg | INTRAVENOUS | Status: DC | PRN
  Administered 2016-04-13 – 2016-04-14 (×6): 0.5 mg via INTRAVENOUS
  Filled 2016-04-13 (×6): qty 1

## 2016-04-13 MED ORDER — FERROUS SULFATE 325 (65 FE) MG PO TABS
325.0000 mg | ORAL_TABLET | Freq: Three times a day (TID) | ORAL | Status: DC
  Administered 2016-04-13 – 2016-04-18 (×14): 325 mg via ORAL
  Filled 2016-04-13 (×18): qty 1

## 2016-04-13 MED ORDER — FOLIC ACID 1 MG PO TABS
1.0000 mg | ORAL_TABLET | Freq: Every day | ORAL | Status: DC
  Administered 2016-04-13 – 2016-04-18 (×5): 1 mg via ORAL
  Filled 2016-04-13 (×6): qty 1

## 2016-04-13 NOTE — Anesthesia Preprocedure Evaluation (Addendum)
Anesthesia Evaluation  Patient identified by MRN, date of birth, ID band Patient awake    Reviewed: Allergy & Precautions, H&P , NPO status , Patient's Chart, lab work & pertinent test results  Airway Mallampati: II  TM Distance: >3 FB Neck ROM: Full    Dental  (+) Partial Upper,    Pulmonary neg shortness of breath, asthma , neg sleep apnea, neg recent URI, Current Smoker,    breath sounds clear to auscultation       Cardiovascular negative cardio ROS   Rhythm:Regular     Neuro/Psych neg Seizures PSYCHIATRIC DISORDERS Depression Low back pain with right leg symptoms  Neuromuscular disease negative psych ROS   GI/Hepatic GERD  Medicated and Controlled,(+)     substance abuse  alcohol use,   Endo/Other  Hypercholesterolemia  Renal/GU Renal diseasenegative Renal ROSHx/o Left renal cell Ca     Musculoskeletal  (+) Arthritis , Osteoarthritis,  Chronic LBP Psoriasis   Abdominal   Peds  Hematology  (+) anemia ,   Anesthesia Other Findings   Reproductive/Obstetrics                           Lab Results  Component Value Date   WBC 15.0* 04/15/2016   HGB 9.6* 04/15/2016   HCT 29.4* 04/15/2016   MCV 87.2 04/15/2016   PLT 503* 04/15/2016   Lab Results  Component Value Date   CREATININE 0.43* 04/14/2016   BUN 11 04/14/2016   NA 137 04/14/2016   K 3.9 04/14/2016   CL 107 04/14/2016   CO2 23 04/14/2016    Anesthesia Physical  Anesthesia Plan  ASA: II  Anesthesia Plan: General   Post-op Pain Management:    Induction: Intravenous  Airway Management Planned: Oral ETT  Additional Equipment: None  Intra-op Plan:   Post-operative Plan: Extubation in OR  Informed Consent: I have reviewed the patients History and Physical, chart, labs and discussed the procedure including the risks, benefits and alternatives for the proposed anesthesia with the patient or authorized representative  who has indicated his/her understanding and acceptance.   Dental advisory given  Plan Discussed with: CRNA, Surgeon and Anesthesiologist  Anesthesia Plan Comments:        Anesthesia Quick Evaluation

## 2016-04-13 NOTE — ED Notes (Signed)
Patient given ice chips per orthopedic surgeon

## 2016-04-13 NOTE — ED Notes (Signed)
Report called to 5th floor RN.  Patient to go to CT first then to the floor.

## 2016-04-13 NOTE — Progress Notes (Signed)
Spoke with Dr. Lyla Glassing who will be taking over care of patient from Dr. Tonita Cong from on ortho standpoint.  HGB is low at 7.9. Recent admission for GI bleed a few days ago.  Will need blood transfusion and will cancel surgery for today.  Will recheck labs. Will allow patient to eat today and then tentatively place patient NPO after MN tonight for possible surgery tomorrow depending upon labs and after Dr. Lyla Glassing reviews CT scan.  Arlee Muslim, [facility]

## 2016-04-13 NOTE — H&P (Addendum)
History and Physical  Patient Name: [name]     [id number]    DOB: 10-04-1946    DOA: 04/13/2016 Referring provider: Shirlyn Goltz, [name] PCP: [name], [place]   Patient coming from: Home     Chief Complaint: Hip pain and fall  HPI: [name] is a 70 y.o. female with a past medical history significant for alcohol use, ?cirrhosis, old hx of RCC, asthnma, chronic back pain, and recent UGIB with blood loss anemia who presents with hip pain after a fall.  The patient was just admitted for five days last week after hematemesis and melena. She had a upper endoscopy that showed portal gastropathy and gastric ulcers, and underwent 2 units of PRBC transfusion and started on PPI. Discharge hemoglobin 9.2 g/dL three days ago.    Since then she was home recovering uneventfully, without further hematemsis or melena, orthostasis, dizziness or fatigue.  This morning, she was at her old home moving clothes to her new home down some stairs with a large load when she collapsed on the stairs, falling about three stair.s.  She had immediate LEFT hip pain and could not walk.    In the ED, the patient was afebrile and hemodynamically stable and a plain radiograph of the LEFT hip showed a comminuted distal femur fracture.  The case was discussed with Dr. Tonita Livingston who agreed to see the patient, and TR[facility] were asked to admit for medical management.    There was no preceding dizziness, weakness, lightheadedness or vertigo, no chest discomfort, palpitations, or dyspnea, nor are there any of those symptoms now.  The patient denies active heart issues, angina, or history of MI. She can exert to an equivalent of 4 METS.  She denies history of TIAs/CVAs, CAD, CHF, or DM treated with insulin. She has no history of COPD or symptoms of OSA, including snoring, daytime drowsiness, apnea, but she does have asthma on Advair.  She has no history of prolonged steroid use and does not use insulin.  She has a history of mild withdrawal  from alcohol.  Anesthesia Specific concerns: Presence of loose teeth: None, but a broken left upper tooth Anesthesia problems in past: None History of bleeding disorder: None  Review of Systems:  Pt complains of LEFT leg pain with movement. All other systems negative except as just noted or noted in the history of present illness.        Past Medical History  Diagnosis Date  . Seasonal allergies   . Hypercholesteremia     under control  . GERD (gastroesophageal reflux disease)     occasional  . Arthritis     back-severe, hips, right knee  . H/O mumps   . H/O measles   . Insomnia   . Asthma   . Allergy   . Renal cell carcinoma 2012    left  . Psoriasis (a type of skin inflammation)   . Alcohol abuse   . Alcohol abuse     Past Surgical History  Procedure Laterality Date  . Kidney surgery  12/2010    [facility]; partial nephrectomy  . Hernia repair  [id number]    supraumbilical repair  . Bunionectomy  04/2011  . Menisectomy  2010    left knee  . Cholecystectomy  11/13/2011    Procedure: LAPAROSCOPIC CHOLECYSTECTOMY WITH INTRAOPERATIVE CHOLANGIOGRAM;  Surgeon: Emily Keens, DO;  Location: [facility];  Service: General;  Laterality: N/A;  . Esophagogastroduodenoscopy N/A 09/25/2013    Procedure: ESOPHAGOGASTRODUODENOSCOPY (EGD);  Surgeon: Emily Ng, [name];  [facility]: [name];  Service: Endoscopy;  Laterality: N/A;  . Colonoscopy N/A 09/25/2013    Procedure: COLONOSCOPY;  Surgeon: Emily Ng, [name];  [facility]: [facility];  Service: Endoscopy;  Laterality: N/A;  . Abdominal hysterectomy  40years ago  . Tubal ligation  44 years ago  . Lumbar laminectomy/decompression microdiscectomy Right 10/12/2014    Procedure: HEMI LAMINECTOMY MICRODISCECTOMY L5-S1 RIGHT (1 LEVEL);  Surgeon: Emily Bastos, [name];  [facility]: [facility];  Service: Orthopedics;  Laterality: Right;  . Esophagogastroduodenoscopy (egd) with propofol Left 01/03/2016    Procedure:  ESOPHAGOGASTRODUODENOSCOPY (EGD) WITH PROPOFOL;  Surgeon: Emily Silence, [name];  [facility];  Service: Endoscopy;  Laterality: Left;  . Esophagogastroduodenoscopy N/A 04/07/2016    Procedure: ESOPHAGOGASTRODUODENOSCOPY (EGD);  Surgeon: Emily Banister, [name];  [facility]: [name];  Service: Endoscopy;  Laterality: N/A;    Social History: Patient lives alone.  Patient walks unassisted.  She  reports that she has been smoking Cigarettes.  She has a .9 pack-year smoking history. She has never used smokeless tobacco. She reports that she drinks alcohol. She reports that she does not use illicit drugs.  Allergies  Allergen Reactions  . Azithromycin Itching and Swelling  . Morphine And Related Itching    Family history: family history includes Hyperlipidemia in her mother; Hypertension in her mother.  Prior to Admission medications   Medication Sig Start Date End Date Taking? Authorizing Provider  albuterol (PROVENTIL HFA;VENTOLIN HFA) 108 (90 Base) MCG/ACT inhaler Inhale 2 puffs into the lungs every 6 (six) hours as needed for wheezing or shortness of breath. 01/19/16  Yes Emily Circle, [name]  docusate sodium ([employer] STOOL SOFTENER) 100 MG capsule Take 1 capsule (100 mg total) by mouth daily as needed for mild constipation. 04/10/16  Yes Emily Lis, [name]  DULoxetine (CYMBALTA) 60 MG capsule Take 1 capsule (60 mg total) by mouth daily. 04/10/16  Yes Emily Lis, [name]  Fluticasone-Salmeterol (ADVAIR) 500-50 MCG/DOSE AEPB Inhale 1 puff into the lungs 2 (two) times daily. 04/10/16  Yes Emily Lis, [name]  folic acid (FOLVITE) 1 MG tablet Take 1 tablet (1 mg total) by mouth daily. 04/10/16  Yes Emily Lis, [name]  LORazepam (ATIVAN) 1 MG tablet Take 1 tablet (1 mg total) by mouth 2 (two) times daily as needed for anxiety (Anxiety, shakiness. DO NOT USE WITH ALCOHOL.). 04/10/16  Yes Emily Lis, [name]  montelukast (SINGULAIR) 10 MG tablet Take 1 tablet (10 mg total) by mouth at bedtime. 03/11/16  Yes  Emily Circle, [name]  Multiple Vitamin (MULTIVITAMIN WITH MINERALS) TABS tablet Take 1 tablet by mouth daily. 01/05/16  Yes Emily P Rama, [name]  nicotine (NICODERM CQ - DOSED IN MG/24 HOURS) 21 mg/24hr patch Place 1 patch (21 mg total) onto the skin daily. 01/05/16  Yes Emily P Rama, [name]  pantoprazole (PROTONIX) 40 MG tablet Take 1 tablet (40 mg total) by mouth 2 (two) times daily. 04/10/16  Yes Emily Lis, [name]  PARoxetine (PAXIL) 10 MG tablet Take 0.5 tablets (5 mg total) by mouth daily. 04/10/16  Yes Emily Lis, [name]  potassium chloride SA (KLOR-CON M20) 20 MEQ tablet Take 1 tablet (20 mEq total) by mouth 2 (two) times daily with a meal. 10/25/15  Yes Emily Circle, [name]  simvastatin (ZOCOR) 40 MG tablet Take 1 tablet (40 mg total) by mouth every morning. 04/10/16  Yes Emily Lis, [name]  sucralfate (CARAFATE) 1 g tablet Take  1 tablet (1 g total) by mouth 4 (four) times daily -  with meals and at bedtime. 03/05/16  Yes Sharlett Iles, [name]  thiamine 100 MG tablet Take 1 tablet (100 mg total) by mouth daily. 04/10/16  Yes Emily Lis, [name]  zolpidem (AMBIEN) 5 MG tablet Take 1 tablet (5 mg total) by mouth at bedtime as needed for sleep. 04/10/16  Yes Emily Lis, [name]  Magnesium Oxide 400 (240 Mg) MG TABS Take 1 tablet by mouth daily. Patient not taking: Reported on 04/13/2016 04/10/16   Emily Lis, [name]  thiamine 100 MG tablet Take 1 tablet (100 mg total) by mouth daily. Patient not taking: Reported on 04/13/2016 01/05/16   Venetia Maxon Rama, [name]  traMADol (ULTRAM) 50 MG tablet Take 1 tablet (50 mg total) by mouth every 6 (six) hours as needed for moderate pain. Patient not taking: Reported on 04/13/2016 01/19/16   Emily Circle, [name]       Physical Exam: BP 116/64 mmHg  Pulse 101  Temp(Src) 98 F (36.7 C) (Oral)  Resp 22  SpO2 95% General appearance: Well-developed, adult female, alert and in no acute distress.   Eyes: Anicteric, conjunctiva pink, lids and lashes normal.     ENT: No  nasal deformity, discharge, or epistaxis.  OP moist without lesions.   Lymph: No cervical, supraclavicular or axillary lymphadenopathy. Skin: Warm and dry.  No suspicious rashes or lesions. Cardiac: RRR, nl [id number], systolic flow murmur.  Capillary refill is brisk.  JVP normal.  Minimal LE edema.  Radial pulses 2+ and symmetric. Respiratory: Normal respiratory rate and rhythm.  CTAB without rales or wheezes. Abdomen: Abdomen soft without rigidity.  No TTP. No ascites, distension.   MSK: LEFT leg in immobilizer. Foreshortening of LEFT leg. Neuro: Cranial nerves normal. Sensorium intact and responding to questions, attention normal.  Speech is fluent.  Moves all extremities equally and with normal coordination.    Psych: Behavior appropriate.  Affect pleasant.  No evidence of aural or visual hallucinations or delusions.       Labs on Admission:  I have personally reviewed following labs and imaging studies: CBC:  Recent Labs Lab 04/06/16 0844  04/07/16 0424 04/07/16 1832 04/08/16 0311 04/09/16 0459 04/10/16 0951 04/13/16 0402  WBC 12.3*  --  9.2  --  10.1 13.2*  --  9.8  NEUTROABS 7.1  --   --   --   --   --   --  5.8  HGB 8.0*  < > 7.1* 9.1* 8.8* 8.0* 9.2* 7.9*  HCT 24.2*  < > 21.4* 27.7* 26.9* 24.7*  --  24.8*  MCV 82.3  --  84.6  --  84.9 87.6  --  88.9  PLT 269  --  256  --  273 282  --  431*  < > = values in this interval not displayed. Basic Metabolic Panel:  Recent Labs Lab 04/06/16 0832 04/06/16 0844 04/08/16 0311 04/09/16 0459 04/13/16 0402  NA 139  --  138 139 139  K 4.0  --  4.0 3.9 3.5  CL 110  --  111 111 106  CO2 20*  --  22 23 25   GLUCOSE 107*  --  113* 125* 122*  BUN 56*  --  11 8 13   CREATININE 0.78  --  0.57 0.52 0.61  CALCIUM 7.9*  --  8.2* 8.1* 8.3*  MG  --  1.5* 1.6* 1.6*  --   PHOS  --  3.9  --   --   --    GFR: Estimated Creatinine Clearance: 61.4 mL/min (by C-G formula based on Cr of 0.61). Liver Function Tests:  Recent Labs Lab  04/06/16 0832 04/08/16 0311  AST 46* 34  ALT 22 20  ALKPHOS 131* 135*  BILITOT 0.9 0.4  PROT 5.3* 5.7*  ALBUMIN 2.6* 2.7*   No results for input(s): LIPASE, AMYLASE in the last 168 hours. No results for input(s): AMMONIA in the last 168 hours. Coagulation Profile:  Recent Labs Lab 04/13/16 0402  INR 1.09   Cardiac Enzymes: No results for input(s): CKTOTAL, CKMB, CKMBINDEX, TROPONINI in the last 168 hours. BNP (last 3 results) No results for input(s): PROBNP in the last 8760 hours. HbA1C: No results for input(s): HGBA1C in the last 72 hours. CBG: No results for input(s): GLUCAP in the last 168 hours. Lipid Profile: No results for input(s): CHOL, HDL, LDLCALC, TRIG, CHOLHDL, LDLDIRECT in the last 72 hours. Thyroid Function Tests: No results for input(s): TSH, T4TOTAL, FREET4, T3FREE, THYROIDAB in the last 72 hours. Anemia Panel: No results for input(s): VITAMINB12, FOLATE, FERRITIN, TIBC, IRON, RETICCTPCT in the last 72 hours.    Radiological Exams on Admission: Personally reviewed: Dg Chest 1 View  04/13/2016  CLINICAL DATA:  Status post fall. Preoperative chest radiograph. Initial encounter. EXAM: CHEST 1 VIEW COMPARISON:  Chest radiograph performed 02/28/2014 FINDINGS: The patient was imaged in decubitus position due to limitations in positioning. Pulmonary vascularity is at the upper limits of normal. Minimal scarring is noted at the left lung base. Trace right-sided pleural fluid is suggested. No pneumothorax identified. The cardiomediastinal silhouette is normal in size. No acute osseous abnormalities are identified. IMPRESSION: Suggestion of trace right-sided pleural fluid. Minimal left basilar scarring. Lungs otherwise clear. Electronically Signed   By: Garald Balding M.D.   On: 04/13/2016 04:02   Dg Femur Min 2 Views Left  04/13/2016  CLINICAL DATA:  Twisted left leg with fall.  Initial encounter. EXAM: LEFT FEMUR 2 VIEWS COMPARISON:  None. FINDINGS: Comminuted  fracturing of the supracondylar femur with dorsal and medial displacement. No visible intercondylar extension, but limited study due to patient positioning. No evidence of knee joint effusion. Severe lower lumbar disc disease with known erosive changes and sclerosis at L5-S1. IMPRESSION: Comminuted and displaced supracondylar femur fracture. No intercondylar component is seen but this is a limited study. Electronically Signed   By: Monte Fantasia M.D.   On: 04/13/2016 04:01    EKG: Independently reviewed. Rate 88, QTc 485, no ST or TW changes.    Assessment and Plan: 1. Leg fracture: The patient will be seen by Dr. Tonita Livingston in the morning at [facility], to evaluate for operative fixation of the LEFT femur.   -Admit to med-surg bed -Hydrocodone-acetaminophen or morphine as tolerated for pain -Bed rest, apply ice, document sedation and vitals per Hip fracture protocol -NPO -MIVF -Nutrition consulted -Repeat H/H at 10a and transfuse for Hgb <8  Overall, the patient is at low CV risk for the planned surgery.  Patient has a RCRI score of 0 (active cardiac condition, CHF, CAD, DM treated with insulin, TIA/CVA, Cr > 2.0). The patient has no active cardiac symptoms, a functional capacity >4 METs, and would be expected to be at average risk for cardiac complications from this intermediate risk procedure. -No further testing needed  Pulmonary: Patient has persistent asthma. Patient is at average risk for pulmonary complications.   Endocrine: Patient has no history of steroid use or DM.  Heme: Transfusion threshold 8 mg/dL.     2. Anemia, from chronic disaease and acute blood loss: Hgb 7.9 g/dL on admission, down from 9 at discharge. -H/H at 10a and transfuse for Hgb < 8 -SCDs -Start iron -Continue PPI  3. Alcohol use: -CIWA protocol  -Thiamine and folate  4. Asthma: -Continue home Advair and albuterol and Singulair  5. Depression: -Continue Cymbalta and Paxil  6. Smoking: Cessation  encouraged. -Nicotine patch       DVT prophylaxis: SCDs  Diet: NPO Code Status: FULL  Family Communication: None present  Disposition Plan: Anticipate evaluation by Orthopedics and surgical fixation today, then PT evaluation and discharge to SNF in 2-3 days.  Patient declines SNF however. Admission status: INPATIENT for hip fracture, medical surgical bed     Medical decision making and consults: Patient seen at 7:49 AM on 04/13/2016. Clinical condition: stable.      Edwin Dada [facility] Pager [phone]

## 2016-04-13 NOTE — ED Notes (Signed)
Patient's left foot is warm, normal color, +movement, +pulse dorsalis pedis marked with a check.

## 2016-04-13 NOTE — Consult Note (Signed)
Reason for Consult: Left leg fracture Referring Physician: EDP  [name] is an 70 y.o. female.  HPI: [place] down steps this AM  Past Medical History  Diagnosis Date  . Seasonal allergies   . Hypercholesteremia     under control  . GERD (gastroesophageal reflux disease)     occasional  . Arthritis     back-severe, hips, right knee  . H/O mumps   . H/O measles   . Insomnia   . Asthma   . Allergy   . Renal cell carcinoma 2012    left  . Psoriasis (a type of skin inflammation)   . Alcohol abuse   . Alcohol abuse     Past Surgical History  Procedure Laterality Date  . Kidney surgery  12/2010    [facility]; partial nephrectomy  . Hernia repair  12/9796    supraumbilical repair  . Bunionectomy  04/2011  . Menisectomy  2010    left knee  . Cholecystectomy  11/13/2011    Procedure: LAPAROSCOPIC CHOLECYSTECTOMY WITH INTRAOPERATIVE CHOLANGIOGRAM;  Surgeon: Judieth Keens, DO;  [facility];  Service: General;  Laterality: N/A;  . Esophagogastroduodenoscopy N/A 09/25/2013    Procedure: ESOPHAGOGASTRODUODENOSCOPY (EGD);  Surgeon: Lear Ng, MD;  [facility]: [facility];  Service: Endoscopy;  Laterality: N/A;  . Colonoscopy N/A 09/25/2013    Procedure: COLONOSCOPY;  Surgeon: Lear Ng, MD;  [facility];  Service: Endoscopy;  Laterality: N/A;  . Abdominal hysterectomy  40years ago  . Tubal ligation  44 years ago  . Lumbar laminectomy/decompression microdiscectomy Right 10/12/2014    Procedure: HEMI LAMINECTOMY MICRODISCECTOMY L5-S1 RIGHT (1 LEVEL);  Surgeon: Tobi Bastos, MD;  [facility];  Service: Orthopedics;  Laterality: Right;  . Esophagogastroduodenoscopy (egd) with propofol Left 01/03/2016    Procedure: ESOPHAGOGASTRODUODENOSCOPY (EGD) WITH PROPOFOL;  Surgeon: Arta Silence, MD;  [facility];  Service: Endoscopy;  Laterality: Left;  . Esophagogastroduodenoscopy N/A 04/07/2016    Procedure: ESOPHAGOGASTRODUODENOSCOPY (EGD);   Surgeon: Milus Banister, MD;  Location: [facility];  Service: Endoscopy;  Laterality: N/A;    Family History  Problem Relation Age of Onset  . Hyperlipidemia Mother   . Hypertension Mother     Social History:  reports that she has been smoking Cigarettes.  She has a .9 pack-year smoking history. She has never used smokeless tobacco. She reports that she drinks alcohol. She reports that she does not use illicit drugs.  Allergies:  Allergies  Allergen Reactions  . Azithromycin Itching and Swelling  . Morphine And Related Itching    Medications: I have reviewed the patient's current medications.  Results for orders placed or performed during the hospital encounter of 04/13/16 (from the past 48 hour(s))  CBC with Differential     Status: Abnormal   Collection Time: 04/13/16  4:02 AM  Result Value Ref Range   WBC 9.8 4.0 - 10.5 K/uL   RBC 2.79 (L) 3.87 - 5.11 MIL/uL   Hemoglobin 7.9 (L) 12.0 - 15.0 g/dL   HCT 24.8 (L) 36.0 - 46.0 %   MCV 88.9 78.0 - 100.0 fL   MCH 28.3 26.0 - 34.0 pg   MCHC 31.9 30.0 - 36.0 g/dL   RDW 18.5 (H) 11.5 - 15.5 %   Platelets 431 (H) 150 - 400 K/uL   Neutrophils Relative % 59 %   Neutro Abs 5.8 1.7 - 7.7 K/uL   Lymphocytes Relative 28 %   Lymphs Abs 2.7 0.7 - 4.0 K/uL  Monocytes Relative 10 %   Monocytes Absolute 1.0 0.1 - 1.0 K/uL   Eosinophils Relative 3 %   Eosinophils Absolute 0.3 0.0 - 0.7 K/uL   Basophils Relative 0 %   Basophils Absolute 0.0 0.0 - 0.1 K/uL  Basic metabolic panel     Status: Abnormal   Collection Time: 04/13/16  4:02 AM  Result Value Ref Range   Sodium 139 135 - 145 mmol/L   Potassium 3.5 3.5 - 5.1 mmol/L   Chloride 106 101 - 111 mmol/L   CO2 25 22 - 32 mmol/L   Glucose, Bld 122 (H) 65 - 99 mg/dL   BUN 13 6 - 20 mg/dL   Creatinine, Ser 0.61 0.44 - 1.00 mg/dL   Calcium 8.3 (L) 8.9 - 10.3 mg/dL   GFR calc non Af Amer >60 >60 mL/min   GFR calc Af Amer >60 >60 mL/min    Comment: (NOTE) The eGFR has been calculated  using the CKD EPI equation. This calculation has not been validated in all clinical situations. eGFR's persistently <60 mL/min signify possible Chronic Kidney Disease.    Anion gap 8 5 - 15  Protime-INR     Status: None   Collection Time: 04/13/16  4:02 AM  Result Value Ref Range   Prothrombin Time 13.9 11.6 - 15.2 seconds   INR 1.09 0.00 - 1.49  Type and screen     Status: None   Collection Time: 04/13/16  4:02 AM  Result Value Ref Range   ABO/RH(D) O POS    Antibody Screen NEG    Sample Expiration 04/16/2016   Urinalysis, Routine w reflex microscopic (not at [facility])     Status: None   Collection Time: 04/13/16  4:36 AM  Result Value Ref Range   Color, Urine YELLOW YELLOW   APPearance CLEAR CLEAR   Specific Gravity, Urine 1.019 1.005 - 1.030   pH 6.5 5.0 - 8.0   Glucose, UA NEGATIVE NEGATIVE mg/dL   Hgb urine dipstick NEGATIVE NEGATIVE   Bilirubin Urine NEGATIVE NEGATIVE   Ketones, ur NEGATIVE NEGATIVE mg/dL   Protein, ur NEGATIVE NEGATIVE mg/dL   Nitrite NEGATIVE NEGATIVE   Leukocytes, UA NEGATIVE NEGATIVE    Comment: MICROSCOPIC NOT DONE ON URINES WITH NEGATIVE PROTEIN, BLOOD, LEUKOCYTES, NITRITE, OR GLUCOSE <1000 mg/dL.    Dg Chest 1 View  04/13/2016  CLINICAL DATA:  Status post fall. Preoperative chest radiograph. Initial encounter. EXAM: CHEST 1 VIEW COMPARISON:  Chest radiograph performed 02/28/2014 FINDINGS: The patient was imaged in decubitus position due to limitations in positioning. Pulmonary vascularity is at the upper limits of normal. Minimal scarring is noted at the left lung base. Trace right-sided pleural fluid is suggested. No pneumothorax identified. The cardiomediastinal silhouette is normal in size. No acute osseous abnormalities are identified. IMPRESSION: Suggestion of trace right-sided pleural fluid. Minimal left basilar scarring. Lungs otherwise clear. Electronically Signed   By: Garald Balding M.D.   On: 04/13/2016 04:02   Dg Femur Min 2 Views  Left  04/13/2016  CLINICAL DATA:  Twisted left leg with fall.  Initial encounter. EXAM: LEFT FEMUR 2 VIEWS COMPARISON:  None. FINDINGS: Comminuted fracturing of the supracondylar femur with dorsal and medial displacement. No visible intercondylar extension, but limited study due to patient positioning. No evidence of knee joint effusion. Severe lower lumbar disc disease with known erosive changes and sclerosis at L5-S1. IMPRESSION: Comminuted and displaced supracondylar femur fracture. No intercondylar component is seen but this is a limited study. Electronically Signed  By: Monte Fantasia M.D.   On: 04/13/2016 04:01    Review of Systems  Musculoskeletal: Positive for joint pain.  All other systems reviewed and are negative.  Blood pressure 116/64, pulse 101, temperature 98 F (36.7 C), temperature source Oral, resp. rate 22, SpO2 95 %. Physical Exam  Constitutional: She is oriented to person, place, and time. She appears well-developed.  HENT:  Head: Normocephalic.  Eyes: Pupils are equal, round, and reactive to light.  Neck: Normal range of motion.  Cardiovascular: Normal rate.   Respiratory: Effort normal.  GI: Soft.  Musculoskeletal: She exhibits edema and tenderness.  Left leg in immobilizer. Compartments soft. 2+ pulses. Motor DF PF 5/5. Sensation intact.  Neurological: She is alert and oriented to person, place, and time.  Skin: Skin is warm and dry.  Psychiatric: She has a normal mood and affect.    Assessment/Plan:  Comminuted left distal femur fracture. Recent GI bleed. Knee immobilizer. Plan ORIF. Will discuss with Dr. Lyla Glassing. Repeat H/H at 10 AM. Possible transfusion. Risks discussed. Admit to Med Service. Robaxin for spasm.   Johathan Province C 04/13/2016, 5:51 AM

## 2016-04-13 NOTE — ED Notes (Signed)
Patient was found at the bottom of the stairs at the extended stay. Patient states that she stepped the wrong way.

## 2016-04-13 NOTE — ED Notes (Signed)
Patient transported to CT 

## 2016-04-13 NOTE — ED Notes (Signed)
Bed: [id number] Expected date:  Expected time:  Means of arrival:  Comments: EMS fall / leg pain

## 2016-04-13 NOTE — ED Provider Notes (Signed)
CSN: [id number]     Arrival date & time 04/13/16  0239 History   By signing my name below, I, Maud Deed. Royston Sinner, attest that this documentation has been prepared under the direction and in the presence of Wandra Arthurs, MD.  Electronically Signed: Maud Deed. Royston Sinner, ED Scribe. 04/13/2016. 3:01 AM.   Chief Complaint  Patient presents with  . Leg Pain   The history is provided by the patient. No language interpreter was used.    HPI Comments: [name] is a 70 y.o. female with a PMHx of arthritis who presents to the Emergency Department complaining of constant, unchanged L leg pain with associated swelling onset just prior to arrival. Pt states she was carrying some clothes while walking down several sets of stairs when she felt herself falling. She then hit hard cement on her way down the 3rd set of stairs. She denies any LOC or head trauma. Pain to leg is exacerbated with pressure to area, movement, and position changes. No alleviating factors at this time. No OTC medications or home remedies attempted prior to arrival. No recent fever or chills. PSHx includes Lumbar laminectomy/decompression microdiscectomy in 2015.  PCP: [name], FNP    Past Medical History  Diagnosis Date  . Seasonal allergies   . Hypercholesteremia     under control  . GERD (gastroesophageal reflux disease)     occasional  . Arthritis     back-severe, hips, right knee  . H/O mumps   . H/O measles   . Insomnia   . Asthma   . Allergy   . Renal cell carcinoma 2012    left  . Psoriasis (a type of skin inflammation)   . Alcohol abuse   . Alcohol abuse    Past Surgical History  Procedure Laterality Date  . Kidney surgery  12/2010    [facility]; partial nephrectomy  . Hernia repair  [id number]    supraumbilical repair  . Bunionectomy  04/2011  . Menisectomy  2010    left knee  . Cholecystectomy  11/13/2011    Procedure: LAPAROSCOPIC CHOLECYSTECTOMY WITH INTRAOPERATIVE CHOLANGIOGRAM;  Surgeon: Judieth Keens, DO;   Location: [facility];  Service: General;  Laterality: N/A;  . Esophagogastroduodenoscopy N/A 09/25/2013    Procedure: ESOPHAGOGASTRODUODENOSCOPY (EGD);  Surgeon: Lear Ng, MD;  Location: [facility];  Service: Endoscopy;  Laterality: N/A;  . Colonoscopy N/A 09/25/2013    Procedure: COLONOSCOPY;  Surgeon: Lear Ng, MD;  Location: [facility];  Service: Endoscopy;  Laterality: N/A;  . Abdominal hysterectomy  40years ago  . Tubal ligation  44 years ago  . Lumbar laminectomy/decompression microdiscectomy Right 10/12/2014    Procedure: HEMI LAMINECTOMY MICRODISCECTOMY L5-S1 RIGHT (1 LEVEL);  Surgeon: Tobi Bastos, MD;  Location: [facility];  Service: Orthopedics;  Laterality: Right;  . Esophagogastroduodenoscopy (egd) with propofol Left 01/03/2016    Procedure: ESOPHAGOGASTRODUODENOSCOPY (EGD) WITH PROPOFOL;  Surgeon: Arta Silence, MD;  Location: [facility];  Service: Endoscopy;  Laterality: Left;  . Esophagogastroduodenoscopy N/A 04/07/2016    Procedure: ESOPHAGOGASTRODUODENOSCOPY (EGD);  Surgeon: Milus Banister, MD;  Location: [facility];  Service: Endoscopy;  Laterality: N/A;   Family History  Problem Relation Age of Onset  . Hyperlipidemia Mother   . Hypertension Mother    Social History  Substance Use Topics  . Smoking status: Current Some Day Smoker -- 0.15 packs/day for 6 years    Types: Cigarettes  . Smokeless tobacco: Never Used  . Alcohol Use: 0.0 oz/week  0 Standard drinks or equivalent per week     Comment: socially   OB History    Gravida Para Term Preterm AB TAB SAB Ectopic Multiple Living   3 3        3      Review of Systems  Constitutional: Negative for fever and chills.  Musculoskeletal: Positive for joint swelling and arthralgias.  All other systems reviewed and are negative.     Allergies  Azithromycin and Morphine and related  Home Medications   Prior to Admission medications   Medication Sig Start Date End Date Taking?  Authorizing Provider  albuterol (PROVENTIL HFA;VENTOLIN HFA) 108 (90 Base) MCG/ACT inhaler Inhale 2 puffs into the lungs every 6 (six) hours as needed for wheezing or shortness of breath. 01/19/16  Yes Golden Circle, FNP  docusate sodium ([employer] STOOL SOFTENER) 100 MG capsule Take 1 capsule (100 mg total) by mouth daily as needed for mild constipation. 04/10/16  Yes Robbie Lis, MD  DULoxetine (CYMBALTA) 60 MG capsule Take 1 capsule (60 mg total) by mouth daily. 04/10/16  Yes Robbie Lis, MD  Fluticasone-Salmeterol (ADVAIR) 500-50 MCG/DOSE AEPB Inhale 1 puff into the lungs 2 (two) times daily. 04/10/16  Yes Robbie Lis, MD  folic acid (FOLVITE) 1 MG tablet Take 1 tablet (1 mg total) by mouth daily. 04/10/16  Yes Robbie Lis, MD  LORazepam (ATIVAN) 1 MG tablet Take 1 tablet (1 mg total) by mouth 2 (two) times daily as needed for anxiety (Anxiety, shakiness. DO NOT USE WITH ALCOHOL.). 04/10/16  Yes Robbie Lis, MD  montelukast (SINGULAIR) 10 MG tablet Take 1 tablet (10 mg total) by mouth at bedtime. 03/11/16  Yes Golden Circle, FNP  Multiple Vitamin (MULTIVITAMIN WITH MINERALS) TABS tablet Take 1 tablet by mouth daily. 01/05/16  Yes Christina P Rama, MD  nicotine (NICODERM CQ - DOSED IN MG/24 HOURS) 21 mg/24hr patch Place 1 patch (21 mg total) onto the skin daily. 01/05/16  Yes Christina P Rama, MD  pantoprazole (PROTONIX) 40 MG tablet Take 1 tablet (40 mg total) by mouth 2 (two) times daily. 04/10/16  Yes Robbie Lis, MD  PARoxetine (PAXIL) 10 MG tablet Take 0.5 tablets (5 mg total) by mouth daily. 04/10/16  Yes Robbie Lis, MD  potassium chloride SA (KLOR-CON M20) 20 MEQ tablet Take 1 tablet (20 mEq total) by mouth 2 (two) times daily with a meal. 10/25/15  Yes Golden Circle, FNP  simvastatin (ZOCOR) 40 MG tablet Take 1 tablet (40 mg total) by mouth every morning. 04/10/16  Yes Robbie Lis, MD  sucralfate (CARAFATE) 1 g tablet Take 1 tablet (1 g total) by mouth 4 (four) times daily -  with  meals and at bedtime. 03/05/16  Yes Sharlett Iles, MD  thiamine 100 MG tablet Take 1 tablet (100 mg total) by mouth daily. 04/10/16  Yes Robbie Lis, MD  zolpidem (AMBIEN) 5 MG tablet Take 1 tablet (5 mg total) by mouth at bedtime as needed for sleep. 04/10/16  Yes Robbie Lis, MD  Magnesium Oxide 400 (240 Mg) MG TABS Take 1 tablet by mouth daily. Patient not taking: Reported on 04/13/2016 04/10/16   Robbie Lis, MD  thiamine 100 MG tablet Take 1 tablet (100 mg total) by mouth daily. Patient not taking: Reported on 04/13/2016 01/05/16   Venetia Maxon Rama, MD  traMADol (ULTRAM) 50 MG tablet Take 1 tablet (50 mg total) by mouth every 6 (six) hours  as needed for moderate pain. Patient not taking: Reported on 04/13/2016 01/19/16   Golden Circle, FNP   Triage Vitals: BP 116/64 mmHg  Pulse 101  Temp(Src) 98 F (36.7 C) (Oral)  Resp 22  SpO2 95%   Physical Exam  Constitutional: She is oriented to person, place, and time. She appears well-developed and well-nourished. No distress.  HENT:  Head: Normocephalic and atraumatic.  Eyes: EOM are normal.  Neck: Normal range of motion.  Cardiovascular: Normal rate, regular rhythm and normal heart sounds.   Pulmonary/Chest: Effort normal and breath sounds normal.  Abdominal: Soft. She exhibits no distension. There is no tenderness.  Musculoskeletal: Normal range of motion. She exhibits tenderness.  No cervical tenderness No scalp hematoma No spinal tenderness R leg normal Able to wiggle toes of L side Palpable pedal pulses to both feet bilaterally  Obvious deformity to L distal femur   Neurological: She is alert and oriented to person, place, and time.  Skin: Skin is warm and dry.  Psychiatric: She has a normal mood and affect. Judgment normal.  Nursing note and vitals reviewed.   ED Course  Procedures (including critical care time)  DIAGNOSTIC STUDIES: Oxygen Saturation is 99% on RA, Normal by my interpretation.    COORDINATION OF  CARE: 2:52 AM- Will order blood work and imaging. Will give Dilaudid. Discussed treatment plan with pt at bedside and pt agreed to plan.     Labs Review Labs Reviewed  CBC WITH DIFFERENTIAL/PLATELET - Abnormal; Notable for the following:    RBC 2.79 (*)    Hemoglobin 7.9 (*)    HCT 24.8 (*)    RDW 18.5 (*)    Platelets 431 (*)    All other components within normal limits  BASIC METABOLIC PANEL - Abnormal; Notable for the following:    Glucose, Bld 122 (*)    Calcium 8.3 (*)    All other components within normal limits  PROTIME-INR  URINALYSIS, ROUTINE W REFLEX MICROSCOPIC (NOT AT [facility])  TYPE AND SCREEN    Imaging Review Dg Chest 1 View  04/13/2016  CLINICAL DATA:  Status post fall. Preoperative chest radiograph. Initial encounter. EXAM: CHEST 1 VIEW COMPARISON:  Chest radiograph performed 02/28/2014 FINDINGS: The patient was imaged in decubitus position due to limitations in positioning. Pulmonary vascularity is at the upper limits of normal. Minimal scarring is noted at the left lung base. Trace right-sided pleural fluid is suggested. No pneumothorax identified. The cardiomediastinal silhouette is normal in size. No acute osseous abnormalities are identified. IMPRESSION: Suggestion of trace right-sided pleural fluid. Minimal left basilar scarring. Lungs otherwise clear. Electronically Signed   By: Garald Balding M.D.   On: 04/13/2016 04:02   Dg Femur Min 2 Views Left  04/13/2016  CLINICAL DATA:  Twisted left leg with fall.  Initial encounter. EXAM: LEFT FEMUR 2 VIEWS COMPARISON:  None. FINDINGS: Comminuted fracturing of the supracondylar femur with dorsal and medial displacement. No visible intercondylar extension, but limited study due to patient positioning. No evidence of knee joint effusion. Severe lower lumbar disc disease with known erosive changes and sclerosis at L5-S1. IMPRESSION: Comminuted and displaced supracondylar femur fracture. No intercondylar component is seen but this is a  limited study. Electronically Signed   By: Monte Fantasia M.D.   On: 04/13/2016 04:01   I have personally reviewed and evaluated these images and lab results as part of my medical decision-making.   EKG Interpretation None      MDM   Final diagnoses:  Fall    [name] is a 70 y.o. female here with fall, concerned for distal femur fracture. Will get preop labs, xrays.   6:05 AM Hg 7.9 likely from bleeding into the leg. Consulted Dr. Tonita Cong from ortho, who recommend knee immobilizer, pain meds. Will operate on her later today. Had recent bleeding from gastro ulcer and had endoscopy and denies melena. He request medical admission and he or his partner will see patient in the morning. Keep NPO for now.     I personally performed the services described in this documentation, which was scribed in my presence. The recorded information has been reviewed and is accurate.   Wandra Arthurs, MD 04/13/16 [phone]

## 2016-04-14 LAB — BASIC METABOLIC PANEL
ANION GAP: 7 (ref 5–15)
BUN: 11 mg/dL (ref 6–20)
CHLORIDE: 107 mmol/L (ref 101–111)
CO2: 23 mmol/L (ref 22–32)
Calcium: 8 mg/dL — ABNORMAL LOW (ref 8.9–10.3)
Creatinine, Ser: 0.43 mg/dL — ABNORMAL LOW (ref 0.44–1.00)
GFR calc non Af Amer: >60 mL/min (ref >60)
GLUCOSE: 126 mg/dL — AB (ref 65–99)
POTASSIUM: 3.9 mmol/L (ref 3.5–5.1)
Sodium: 137 mmol/L (ref 135–145)

## 2016-04-14 MED ORDER — ENSURE ENLIVE PO LIQD
237.0000 mL | Freq: Two times a day (BID) | ORAL | Status: DC
  Administered 2016-04-14 – 2016-04-18 (×5): 237 mL via ORAL

## 2016-04-14 MED ORDER — DIPHENHYDRAMINE HCL 25 MG PO CAPS
25.0000 mg | ORAL_CAPSULE | Freq: Once | ORAL | Status: AC
  Administered 2016-04-14: 25 mg via ORAL
  Filled 2016-04-14: qty 1

## 2016-04-14 MED ORDER — MORPHINE SULFATE (PF) 2 MG/ML IV SOLN
0.5000 mg | INTRAVENOUS | Status: DC | PRN
  Administered 2016-04-14 – 2016-04-15 (×8): 2 mg via INTRAVENOUS
  Filled 2016-04-14 (×8): qty 1

## 2016-04-14 MED ORDER — MORPHINE SULFATE (PF) 2 MG/ML IV SOLN
2.0000 mg | Freq: Once | INTRAVENOUS | Status: AC
  Administered 2016-04-14: 2 mg via INTRAVENOUS
  Filled 2016-04-14: qty 1

## 2016-04-14 MED ORDER — METHOCARBAMOL 500 MG PO TABS
500.0000 mg | ORAL_TABLET | Freq: Three times a day (TID) | ORAL | Status: AC | PRN
  Administered 2016-04-14 – 2016-04-16 (×4): 500 mg via ORAL
  Filled 2016-04-14 (×4): qty 1

## 2016-04-14 NOTE — Progress Notes (Addendum)
Subjective: L distal femur fx, comminuted, displaced   Seen in AM rounds for Dr. Lyla Glassing. Reports pain L thigh. No other c/o. Asking for pain meds last received overnight. No N/V, fever, chills, CP, SOB, calf pain. No numbness or tingling.  Objective: Vital signs in last 24 hours: Temp:  [97.9 F (36.6 C)-99.3 F (37.4 C)] 99.3 F (37.4 C) (06/04 0509) Pulse Rate:  [89-106] 101 (06/04 0509) Resp:  [17-20] 17 (06/04 0509) BP: (122-143)/(61-83) 129/69 mmHg (06/04 0509) SpO2:  [97 %-100 %] 100 % (06/04 0509)  Intake/Output from previous day: 06/03 0701 - 06/04 0700 In: 2291.2 [P.O.:240; I.V.:1391.7; Blood:604.5; IV Piggyback:55] Out: 2825 [Urine:2825] Intake/Output this shift:     Recent Labs  04/13/16 0402 04/13/16 1011 04/14/16 0519  HGB 7.9* 7.3* 9.4*    Recent Labs  04/13/16 0402 04/13/16 1011 04/14/16 0519  WBC 9.8  --  11.2*  RBC 2.79*  --  3.30*  HCT 24.8* 22.8* 28.2*  PLT 431*  --  435*    Recent Labs  04/13/16 0402 04/14/16 0519  NA 139 137  K 3.5 3.9  CL 106 107  CO2 25 23  BUN 13 11  CREATININE 0.61 0.43*  GLUCOSE 122* 126*  CALCIUM 8.3* 8.0*    Recent Labs  04/13/16 0402  INR 1.09    Neurologically intact ABD soft Neurovascular intact Sensation intact distally Intact pulses distally Dorsiflexion/Plantar flexion intact No cellulitis present Compartment soft no calf pain or sign of DVT  Knee immobilizer in place  Assessment/Plan: L distal femur fx, comminuted, displaced  Possible OR today- keep NPO Hgb up to 9.4 today  Pain meds prn- already ordered To remain in knee immobilizer  Will discuss with Dr. Nash Shearer, Conley Rolls. 04/14/2016, 7:36 AM     Due to multiple emergent trauma cases at [facility] and OR availability, surgery postponed until tomorrow 6/5 for Dr. Wynelle Link, per Dr. Lyla Glassing. Spoke with nurse and pt directly to make them aware. Order in for regular diet today and NPO after MN for OR tomorrow

## 2016-04-14 NOTE — Progress Notes (Addendum)
PROGRESS NOTE    [name]  [id number] DOB: Dec 28, 1945 DOA: 04/13/2016 PCP: [name], FNP     Brief Narrative:  [name] is a 70 y.o. female with a past medical history significant for alcohol use, ?cirrhosis, old hx of RCC, asthnma, chronic back pain, and recent UGIB with blood loss anemia who presents with hip pain after a fall.   Assessment & Plan:  Principal Problem:   Displaced oblique fracture of shaft of left femur (HCC) Active Problems:   Asthma   Chronic back pain   Alcohol abuse   Acute blood loss anemia     1. Leg fracture: CT shows "Comminuted, angulated and displaced fracture of the distal femoral diaphysis" with "extension into the metaphysis with involvement of both femoral epicondyles, but no intercondylar extension or involvement of the weight-bearing articular surface." and "No large hematoma or obvious neurovascular injury on noncontrast imaging." -Increase morphine to 0.5-2 mg q2hrs for dose finding, titrate to 4 mg q6hrs after surgery -Continue Robaxin -Bed rest, apply ice, document sedation and vitals per Hip fracture protocol -NPO -MIVF   2. Anemia, from chronic disaease and acute blood loss: Hgb 7.9 g/dL --> 9.4 g/dL.   -SCDs -Continue iron -Continue PPI  3. Alcohol use: CIWA scores low -CIWA protocol  -Thiamine and folate  4. Asthma: -Continue home Advair and albuterol and Singulair  5. Depression: -Continue Cymbalta and Paxil  6. Smoking: Cessation was encouraged. -Nicotine patch     DVT prophylaxis: SCDs Code Status: FULL Family Communication: None present Disposition Plan: To OR today, with Dr. Delfino Lovett, procedure TBD.  Patient expects to be NWB after surgery   Consultants:   Orthopedics  Procedures:   None  Antimicrobials:   None    Subjective: Feeling uncontrolled pain this morning in leg.  LEFT foot feeling good, no pain.  Pain comes in spasms.  No chest pain, dyspnea,  confusion.    Objective: Filed Vitals:   04/13/16 2022 04/13/16 2028 04/14/16 0041 04/14/16 0509  BP: 132/69  122/65 129/69  Pulse: 96  102 101  Temp: 99 F (37.2 C)  98 F (36.7 C) 99.3 F (37.4 C)  TempSrc: Oral  Oral Oral  Resp: 17  19 17   SpO2: 100% 98% 100% 100%    Intake/Output Summary (Last 24 hours) at 04/14/16 0806 Last data filed at 04/14/16 0600  Gross per 24 hour  Intake 2291.16 ml  Output   2825 ml  Net -533.84 ml   There were no vitals filed for this visit.  Examination:  General exam: Appears calm and comfortable  Respiratory system: Clear to auscultation. Respiratory effort normal. Cardiovascular system: S1 & S2 heard, RRR. No JVD, murmurs, rubs, gallops or clicks. No pedal edema. Gastrointestinal system: Abdomen is nondistended, soft and nontender. No organomegaly or masses felt. Normal bowel sounds heard. Central nervous system: Alert and oriented. No focal neurological deficits. Extremities: LEFT leg in immobilizer. Skin: No rashes, lesions or ulcers Psychiatry: Judgement and insight appear normal. Mood & affect appropriate.     Data Reviewed: I have personally reviewed following labs and imaging studies  CBC:  Recent Labs Lab 04/08/16 0311 04/09/16 0459 04/10/16 0951 04/13/16 0402 04/13/16 1011 04/14/16 0519  WBC 10.1 13.2*  --  9.8  --  11.2*  NEUTROABS  --   --   --  5.8  --   --   HGB 8.8* 8.0* 9.2* 7.9* 7.3* 9.4*  HCT 26.9* 24.7*  --  24.8* 22.8* 28.2*  MCV 84.9 87.6  --  88.9  --  85.5  PLT 273 282  --  431*  --  [id number]*   Basic Metabolic Panel:  Recent Labs Lab 04/08/16 0311 04/09/16 0459 04/13/16 0402 04/14/16 0519  NA 138 139 139 137  K 4.0 3.9 3.5 3.9  CL 111 111 106 107  CO2 22 23 25 23   GLUCOSE 113* 125* 122* 126*  BUN 11 8 13 11   CREATININE 0.57 0.52 0.61 0.43*  CALCIUM 8.2* 8.1* 8.3* 8.0*  MG 1.6* 1.6*  --   --    GFR: Estimated Creatinine Clearance: 61.4 mL/min (by C-G formula based on Cr of 0.43). Liver  Function Tests:  Recent Labs Lab 04/08/16 0311  AST 34  ALT 20  ALKPHOS 135*  BILITOT 0.4  PROT 5.7*  ALBUMIN 2.7*   No results for input(s): LIPASE, AMYLASE in the last 168 hours. No results for input(s): AMMONIA in the last 168 hours. Coagulation Profile:  Recent Labs Lab 04/13/16 0402  INR 1.09   Cardiac Enzymes: No results for input(s): CKTOTAL, CKMB, CKMBINDEX, TROPONINI in the last 168 hours. BNP (last 3 results) No results for input(s): PROBNP in the last 8760 hours. HbA1C: No results for input(s): HGBA1C in the last 72 hours. CBG: No results for input(s): GLUCAP in the last 168 hours. Lipid Profile: No results for input(s): CHOL, HDL, LDLCALC, TRIG, CHOLHDL, LDLDIRECT in the last 72 hours. Thyroid Function Tests: No results for input(s): TSH, T4TOTAL, FREET4, T3FREE, THYROIDAB in the last 72 hours. Anemia Panel: No results for input(s): VITAMINB12, FOLATE, FERRITIN, TIBC, IRON, RETICCTPCT in the last 72 hours. Urine analysis:    Component Value Date/Time   COLORURINE YELLOW 04/13/2016 0436   APPEARANCEUR CLEAR 04/13/2016 0436   LABSPEC 1.019 04/13/2016 0436   PHURINE 6.5 04/13/2016 0436   GLUCOSEU NEGATIVE 04/13/2016 0436   HGBUR NEGATIVE 04/13/2016 0436   HGBUR negative 07/07/2007 0838   BILIRUBINUR NEGATIVE 04/13/2016 0436   KETONESUR NEGATIVE 04/13/2016 0436   PROTEINUR NEGATIVE 04/13/2016 0436   UROBILINOGEN 1.0 04/22/2015 0020   NITRITE NEGATIVE 04/13/2016 0436   LEUKOCYTESUR NEGATIVE 04/13/2016 0436   Sepsis Labs: @LABRCNTIP (procalcitonin:4,lacticidven:4)  ) Recent Results (from the past 240 hour(s))  MRSA PCR Screening     Status: None   Collection Time: 04/06/16  1:20 AM  Result Value Ref Range Status   MRSA by PCR NEGATIVE NEGATIVE Final    Comment:        The GeneXpert MRSA Assay (FDA approved for NASAL specimens only), is one component of a comprehensive MRSA colonization surveillance program. It is not intended to diagnose  MRSA infection nor to guide or monitor treatment for MRSA infections.   Surgical PCR screen     Status: None   Collection Time: 04/13/16  6:31 PM  Result Value Ref Range Status   MRSA, PCR NEGATIVE NEGATIVE Final   Staphylococcus aureus NEGATIVE NEGATIVE Final    Comment:        The Xpert SA Assay (FDA approved for NASAL specimens in patients over 34 years of age), is one component of a comprehensive surveillance program.  Test performance has been validated by [employer] [facility] for patients greater than or equal to 60 year old. It is not intended to diagnose infection nor to guide or monitor treatment.          Radiology Studies: Dg Chest 1 View  04/13/2016  CLINICAL DATA:  Status post fall. Preoperative chest radiograph. Initial encounter. EXAM: CHEST 1 VIEW  COMPARISON:  Chest radiograph performed 02/28/2014 FINDINGS: The patient was imaged in decubitus position due to limitations in positioning. Pulmonary vascularity is at the upper limits of normal. Minimal scarring is noted at the left lung base. Trace right-sided pleural fluid is suggested. No pneumothorax identified. The cardiomediastinal silhouette is normal in size. No acute osseous abnormalities are identified. IMPRESSION: Suggestion of trace right-sided pleural fluid. Minimal left basilar scarring. Lungs otherwise clear. Electronically Signed   By: Garald Balding M.D.   On: 04/13/2016 04:02   Ct Femur Left Wo Contrast  04/13/2016  CLINICAL DATA:  Fall on stairs. Evaluate femur fracture. Initial encounter. EXAM: CT OF THE LEFT FEMUR WITHOUT CONTRAST TECHNIQUE: Multidetector CT imaging was performed according to the standard protocol. Multiplanar CT image reconstructions were also generated. COMPARISON:  Radiographs same date. FINDINGS: Bones: There is a comminuted, angulated and displaced fracture of the distal femoral diaphysis, extending into the metaphysis. There is up to 2.7 cm of proximal displacement at the fracture  proximally. There is approximately 25 degrees of apex lateral angulation. There is also nearly 1 full shaft width of posteromedial displacement as well as some rotation. There are nondisplaced components which extend into the distal femoral metaphysis, involving both femoral epicondyles. No intercondylar extension or involvement of the weight-bearing aspects of condyles is seen. The proximal femur, including the femoral head and neck are intact. There is no dislocation at the hip or knee. The proximal tibia and proximal fibula appear intact. Joint/cartilage: Small knee joint effusion. Mild degenerative changes are present at the knee with meniscal chondrocalcinosis. No significant findings are seen at the hip. Ligaments: Not applicable for exam/indication. The cruciate ligaments appear intact at the knee. Tendons/muscles: Probable muscular contusion at the level of the comminuted fracture. No large hematoma identified. Neurovascular/other soft tissues: Mild femoral artery atherosclerosis. There is no large tree vascular hematoma. Relatively mild subcutaneous edema is present within the mid to distal thigh. IMPRESSION: 1. Comminuted, angulated and displaced fracture of the distal femoral diaphysis as described. There is extension into the metaphysis with involvement of both femoral epicondyles, but no intercondylar extension or involvement of the weight-bearing articular surface. 2. The proximal femur is intact.  No dislocation. 3. No large hematoma or obvious neurovascular injury on noncontrast imaging. Electronically Signed   By: Richardean Sale M.D.   On: 04/13/2016 08:51   Dg Femur Min 2 Views Left  04/13/2016  CLINICAL DATA:  Twisted left leg with fall.  Initial encounter. EXAM: LEFT FEMUR 2 VIEWS COMPARISON:  None. FINDINGS: Comminuted fracturing of the supracondylar femur with dorsal and medial displacement. No visible intercondylar extension, but limited study due to patient positioning. No evidence of knee  joint effusion. Severe lower lumbar disc disease with known erosive changes and sclerosis at L5-S1. IMPRESSION: Comminuted and displaced supracondylar femur fracture. No intercondylar component is seen but this is a limited study. Electronically Signed   By: Monte Fantasia M.D.   On: 04/13/2016 04:01        Scheduled Meds: . DULoxetine  60 mg Oral Daily  . ferrous sulfate  325 mg Oral TID PC  . folic acid  1 mg Oral Daily  . mometasone-formoterol  2 puff Inhalation BID  . montelukast  10 mg Oral QHS  . multivitamin with minerals  1 tablet Oral Daily  . nicotine  21 mg Transdermal Daily  . pantoprazole  40 mg Oral BID  . PARoxetine  5 mg Oral Daily  . potassium chloride SA  20 mEq  Oral BID WC  . simvastatin  40 mg Oral q morning - 10a  . sucralfate  1 g Oral TID WC & HS  . thiamine  100 mg Oral Daily   Continuous Infusions: . sodium chloride 100 mL/hr at 04/13/16 2131     LOS: 1 day    Time spent: [place], MD [facility] Pager [phone]  If 7PM-7AM, please contact night-coverage [email or web address] Password [email or web address] 04/14/2016, 8:06 AM

## 2016-04-14 NOTE — Progress Notes (Signed)
Spoke with Lacie Draft, PA, regarding status of patient surgery.  Patient is anxious, irritated, and wants to know when the surgery will take place.  Ellison Hughs reported to me that patient surgery will not take place today - will be performed tomorrow by Dr. Wynelle Link.  Ellison Hughs spoke to patient on the phone at this time and reported the information to her as well.  Ellison Hughs putting in order for patient diet at this time.

## 2016-04-14 NOTE — Progress Notes (Signed)
Nutrition Brief Note  RD consulted via Hip Fracture Protocol.  Patient denies any appetite changes PTA. She reports being very hungry. She is agreeable to receiving Ensure supplements. Pt with no weight changes per weight history.  Wt Readings from Last 15 Encounters:  04/05/16 150 lb (68.04 kg)  03/11/16 152 lb (68.947 kg)  01/19/16 148 lb 12 oz (67.473 kg)  01/04/16 154 lb 1.6 oz (69.9 kg)  12/11/15 145 lb (65.772 kg)  10/25/15 147 lb 12.8 oz (67.042 kg)  09/08/15 154 lb (69.854 kg)  09/06/15 145 lb (65.772 kg)  08/18/15 133 lb 12.8 oz (60.691 kg)  07/24/15 144 lb (65.318 kg)  07/05/15 145 lb (65.772 kg)  06/01/15 143 lb (64.864 kg)  05/03/15 139 lb 1.9 oz (63.104 kg)  04/14/15 143 lb (64.864 kg)  10/12/14 139 lb (63.05 kg)    BMI: 25.0 kg/m^2. Patient meets criteria for normal range based on current BMI.   Current diet order is Regular, patient is consuming approximately 100% of meals at this time. Labs and medications reviewed.   No nutrition interventions warranted at this time. If nutrition issues arise, please consult RD.   Emily Bibles, MS, RD, LDN Pager: [phone] After Hours Pager: [phone]
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
## 2016-04-15 ENCOUNTER — Inpatient Hospital Stay (HOSPITAL_COMMUNITY): Payer: Medicare HMO

## 2016-04-15 ENCOUNTER — Inpatient Hospital Stay (HOSPITAL_COMMUNITY): Payer: Medicare HMO | Admitting: Anesthesiology

## 2016-04-15 ENCOUNTER — Encounter (HOSPITAL_COMMUNITY): Payer: Self-pay | Admitting: Registered Nurse

## 2016-04-15 ENCOUNTER — Encounter (HOSPITAL_COMMUNITY)
Admission: EM | Disposition: A | Discharge status: Skilled Nursing Facility | Payer: Self-pay | Source: Home / Self Care | Attending: Internal Medicine

## 2016-04-15 HISTORY — PX: FEMUR IM NAIL: SHX1597

## 2016-04-15 LAB — CBC
HCT: 28.2 % — ABNORMAL LOW (ref 36.0–46.0)
HEMATOCRIT: 29.4 % — AB (ref 36.0–46.0)
HEMOGLOBIN: 9.4 g/dL — AB (ref 12.0–15.0)
HEMOGLOBIN: 9.6 g/dL — AB (ref 12.0–15.0)
MCH: 28.5 pg (ref 26.0–34.0)
MCH: 28.5 pg (ref 26.0–34.0)
MCHC: 33.3 g/dL (ref 30.0–36.0)
MCHC: 32.7 g/dL (ref 30.0–36.0)
MCV: 85.5 fL (ref 78.0–100.0)
MCV: 87.2 fL (ref 78.0–100.0)
Platelets: 435 10*3/uL — ABNORMAL HIGH (ref 150–400)
Platelets: 503 10*3/uL — ABNORMAL HIGH (ref 150–400)
RBC: 3.3 MIL/uL — ABNORMAL LOW (ref 3.87–5.11)
RBC: 3.37 MIL/uL — AB (ref 3.87–5.11)
RDW: 17.8 % — AB (ref 11.5–15.5)
RDW: 18.1 % — AB (ref 11.5–15.5)
WBC: 11.2 10*3/uL — AB (ref 4.0–10.5)
WBC: 15 10*3/uL — AB (ref 4.0–10.5)

## 2016-04-15 SURGERY — INSERTION, INTRAMEDULLARY ROD, FEMUR, RETROGRADE
Anesthesia: General | Site: Hip | Laterality: Left

## 2016-04-15 MED ORDER — HYDROMORPHONE HCL 1 MG/ML IJ SOLN
0.5000 mg | INTRAMUSCULAR | Status: DC | PRN
  Administered 2016-04-15 – 2016-04-16 (×6): 0.5 mg via INTRAVENOUS
  Filled 2016-04-15 (×6): qty 1

## 2016-04-15 MED ORDER — HYDROMORPHONE HCL 1 MG/ML IJ SOLN: 0.2500 mg | INTRAMUSCULAR | Status: DC | PRN

## 2016-04-15 MED ORDER — PHENYLEPHRINE HCL 10 MG/ML IJ SOLN
INTRAMUSCULAR | Status: DC | PRN
  Administered 2016-04-15 (×2): 40 ug via INTRAVENOUS

## 2016-04-15 MED ORDER — METOCLOPRAMIDE HCL 5 MG PO TABS
5.0000 mg | ORAL_TABLET | Freq: Three times a day (TID) | ORAL | Status: DC | PRN
  Filled 2016-04-15: qty 2

## 2016-04-15 MED ORDER — MEPERIDINE HCL 50 MG/ML IJ SOLN: 6.2500 mg | INTRAMUSCULAR | Status: DC | PRN

## 2016-04-15 MED ORDER — HYDROMORPHONE HCL 1 MG/ML IJ SOLN
INTRAMUSCULAR | Status: DC | PRN
  Administered 2016-04-15 (×3): 0.5 mg via INTRAVENOUS

## 2016-04-15 MED ORDER — ACETAMINOPHEN 500 MG PO TABS
1000.0000 mg | ORAL_TABLET | Freq: Four times a day (QID) | ORAL | Status: DC
  Administered 2016-04-15 – 2016-04-16 (×2): 1000 mg via ORAL
  Filled 2016-04-15 (×4): qty 2

## 2016-04-15 MED ORDER — METOCLOPRAMIDE HCL 5 MG/ML IJ SOLN: 5.0000 mg | Freq: Three times a day (TID) | INTRAMUSCULAR | Status: DC | PRN

## 2016-04-15 MED ORDER — ROCURONIUM BROMIDE 100 MG/10ML IV SOLN
INTRAVENOUS | Status: DC | PRN
  Administered 2016-04-15: 30 mg via INTRAVENOUS

## 2016-04-15 MED ORDER — LIDOCAINE HCL (CARDIAC) 20 MG/ML IV SOLN
INTRAVENOUS | Status: DC | PRN
  Administered 2016-04-15: 100 mg via INTRAVENOUS

## 2016-04-15 MED ORDER — ENOXAPARIN SODIUM 40 MG/0.4ML ~~LOC~~ SOLN
40.0000 mg | SUBCUTANEOUS | Status: DC
  Administered 2016-04-16 – 2016-04-18 (×3): 40 mg via SUBCUTANEOUS
  Filled 2016-04-15 (×5): qty 0.4

## 2016-04-15 MED ORDER — ONDANSETRON HCL 4 MG/2ML IJ SOLN: 4.0000 mg | Freq: Four times a day (QID) | INTRAMUSCULAR | Status: DC | PRN

## 2016-04-15 MED ORDER — CEFAZOLIN SODIUM-DEXTROSE 2-4 GM/100ML-% IV SOLN
INTRAVENOUS | Status: AC
  Filled 2016-04-15: qty 100

## 2016-04-15 MED ORDER — LACTATED RINGERS IV SOLN
INTRAVENOUS | Status: DC | PRN
  Administered 2016-04-15: 17:00:00 via INTRAVENOUS

## 2016-04-15 MED ORDER — PROPOFOL 10 MG/ML IV BOLUS
INTRAVENOUS | Status: AC
  Filled 2016-04-15: qty 20

## 2016-04-15 MED ORDER — HYDROMORPHONE HCL 2 MG/ML IJ SOLN
INTRAMUSCULAR | Status: AC
  Filled 2016-04-15: qty 1

## 2016-04-15 MED ORDER — SUGAMMADEX SODIUM 200 MG/2ML IV SOLN
INTRAVENOUS | Status: AC
  Filled 2016-04-15: qty 2

## 2016-04-15 MED ORDER — ONDANSETRON HCL 4 MG/2ML IJ SOLN: 4.0000 mg | Freq: Once | INTRAMUSCULAR | Status: DC | PRN

## 2016-04-15 MED ORDER — CEFAZOLIN SODIUM-DEXTROSE 2-4 GM/100ML-% IV SOLN
2.0000 g | Freq: Four times a day (QID) | INTRAVENOUS | Status: AC
  Administered 2016-04-15 – 2016-04-16 (×3): 2 g via INTRAVENOUS
  Filled 2016-04-15 (×3): qty 100

## 2016-04-15 MED ORDER — FENTANYL CITRATE (PF) 100 MCG/2ML IJ SOLN
INTRAMUSCULAR | Status: AC
  Filled 2016-04-15: qty 2

## 2016-04-15 MED ORDER — DEXAMETHASONE SODIUM PHOSPHATE 10 MG/ML IJ SOLN
INTRAMUSCULAR | Status: DC | PRN
  Administered 2016-04-15: 10 mg via INTRAVENOUS

## 2016-04-15 MED ORDER — FENTANYL CITRATE (PF) 100 MCG/2ML IJ SOLN
INTRAMUSCULAR | Status: DC | PRN
  Administered 2016-04-15 (×4): 50 ug via INTRAVENOUS

## 2016-04-15 MED ORDER — MIDAZOLAM HCL 5 MG/5ML IJ SOLN
INTRAMUSCULAR | Status: DC | PRN
  Administered 2016-04-15: 2 mg via INTRAVENOUS

## 2016-04-15 MED ORDER — BUPIVACAINE HCL (PF) 0.25 % IJ SOLN
INTRAMUSCULAR | Status: AC
  Filled 2016-04-15: qty 30

## 2016-04-15 MED ORDER — ACETAMINOPHEN 325 MG PO TABS: 650.0000 mg | ORAL_TABLET | Freq: Four times a day (QID) | ORAL | Status: DC | PRN

## 2016-04-15 MED ORDER — 0.9 % SODIUM CHLORIDE (POUR BTL) OPTIME
TOPICAL | Status: DC | PRN
  Administered 2016-04-15: 1000 mL

## 2016-04-15 MED ORDER — ONDANSETRON HCL 4 MG/2ML IJ SOLN
INTRAMUSCULAR | Status: DC | PRN
  Administered 2016-04-15: 4 mg via INTRAVENOUS

## 2016-04-15 MED ORDER — LIDOCAINE HCL (CARDIAC) 20 MG/ML IV SOLN
INTRAVENOUS | Status: AC
Start: 2016-04-15 — End: 2016-04-15
  Filled 2016-04-15: qty 5

## 2016-04-15 MED ORDER — CEFAZOLIN SODIUM-DEXTROSE 2-3 GM-% IV SOLR
INTRAVENOUS | Status: DC | PRN
  Administered 2016-04-15: 2 g via INTRAVENOUS

## 2016-04-15 MED ORDER — ROCURONIUM BROMIDE 100 MG/10ML IV SOLN
INTRAVENOUS | Status: AC
  Filled 2016-04-15: qty 1

## 2016-04-15 MED ORDER — ACETAMINOPHEN 650 MG RE SUPP: 650.0000 mg | Freq: Four times a day (QID) | RECTAL | Status: DC | PRN

## 2016-04-15 MED ORDER — ONDANSETRON HCL 4 MG/2ML IJ SOLN
INTRAMUSCULAR | Status: AC
  Filled 2016-04-15: qty 2

## 2016-04-15 MED ORDER — OXYCODONE HCL 5 MG PO TABS
5.0000 mg | ORAL_TABLET | ORAL | Status: DC | PRN
  Administered 2016-04-16: 10 mg via ORAL
  Filled 2016-04-15: qty 2

## 2016-04-15 MED ORDER — SUCCINYLCHOLINE CHLORIDE 20 MG/ML IJ SOLN
INTRAMUSCULAR | Status: DC | PRN
  Administered 2016-04-15: 100 mg via INTRAVENOUS

## 2016-04-15 MED ORDER — PHENYLEPHRINE 40 MCG/ML (10ML) SYRINGE FOR IV PUSH (FOR BLOOD PRESSURE SUPPORT)
PREFILLED_SYRINGE | INTRAVENOUS | Status: AC
  Filled 2016-04-15: qty 10

## 2016-04-15 MED ORDER — SUGAMMADEX SODIUM 200 MG/2ML IV SOLN
INTRAVENOUS | Status: DC | PRN
  Administered 2016-04-15: 130 mg via INTRAVENOUS

## 2016-04-15 MED ORDER — DEXAMETHASONE SODIUM PHOSPHATE 10 MG/ML IJ SOLN
INTRAMUSCULAR | Status: AC
  Filled 2016-04-15: qty 1

## 2016-04-15 MED ORDER — PROPOFOL 10 MG/ML IV BOLUS
INTRAVENOUS | Status: DC | PRN
  Administered 2016-04-15: 200 mg via INTRAVENOUS

## 2016-04-15 MED ORDER — MIDAZOLAM HCL 2 MG/2ML IJ SOLN
INTRAMUSCULAR | Status: AC
  Filled 2016-04-15: qty 2

## 2016-04-15 MED ORDER — ONDANSETRON HCL 4 MG PO TABS: 4.0000 mg | ORAL_TABLET | Freq: Four times a day (QID) | ORAL | Status: DC | PRN

## 2016-04-15 SURGICAL SUPPLY — 86 items
BAG SPEC THK2 15X12 ZIP CLS (MISCELLANEOUS)
BAG ZIPLOCK 12X15 (MISCELLANEOUS) ×1 IMPLANT
BANDAGE ACE 6X5 VEL STRL LF (GAUZE/BANDAGES/DRESSINGS) ×7 IMPLANT
BANDAGE ESMARK 6X9 LF (GAUZE/BANDAGES/DRESSINGS) ×1 IMPLANT
BIT DRILL CALIBRATED 4.3MMX365 (DRILL) IMPLANT
BIT DRILL CROWE PNT TWST 4.5MM (DRILL) IMPLANT
BNDG CMPR 9X6 STRL LF SNTH (GAUZE/BANDAGES/DRESSINGS)
BNDG ESMARK 6X9 LF (GAUZE/BANDAGES/DRESSINGS)
CHLORAPREP W/TINT 26ML (MISCELLANEOUS) ×6 IMPLANT
CLOSURE WOUND 1/2 X4 (GAUZE/BANDAGES/DRESSINGS)
CUFF TOURN SGL QUICK 34 (TOURNIQUET CUFF) ×3
CUFF TOURN SGL QUICK 44 (TOURNIQUET CUFF) ×2 IMPLANT
CUFF TRNQT CYL 34X4X40X1 (TOURNIQUET CUFF) ×1 IMPLANT
DRAPE C-ARM 42X120 X-RAY (DRAPES) ×4 IMPLANT
DRAPE C-ARMOR (DRAPES) ×3 IMPLANT
DRAPE INCISE IOBAN 85X60 (DRAPES) ×3 IMPLANT
DRAPE ORTHO SPLIT 77X108 STRL (DRAPES) ×6
DRAPE POUCH INSTRU U-SHP 10X18 (DRAPES) ×1 IMPLANT
DRAPE SHEET LG 3/4 BI-LAMINATE (DRAPES) ×6 IMPLANT
DRAPE SURG ORHT 6 SPLT 77X108 (DRAPES) ×4 IMPLANT
DRAPE U-SHAPE 47X51 STRL (DRAPES) ×7 IMPLANT
DRILL CALIBRATED 4.3MMX365 (DRILL) ×3
DRILL CROWE POINT TWIST 4.5MM (DRILL) ×3
DRSG ADAPTIC 3X8 NADH LF (GAUZE/BANDAGES/DRESSINGS) ×1 IMPLANT
DRSG AQUACEL AG ADV 3.5X14 (GAUZE/BANDAGES/DRESSINGS) ×3 IMPLANT
DRSG MEPILEX BORDER 4X4 (GAUZE/BANDAGES/DRESSINGS) ×10 IMPLANT
DRSG MEPILEX BORDER 4X8 (GAUZE/BANDAGES/DRESSINGS) ×1 IMPLANT
DRSG PAD ABDOMINAL 8X10 ST (GAUZE/BANDAGES/DRESSINGS) ×1 IMPLANT
DURAPREP 26ML APPLICATOR (WOUND CARE) ×1 IMPLANT
ELECT REM PT RETURN 9FT ADLT (ELECTROSURGICAL) ×3
ELECTRODE REM PT RTRN 9FT ADLT (ELECTROSURGICAL) ×1 IMPLANT
EVACUATOR 1/8 PVC DRAIN (DRAIN) ×3 IMPLANT
GAUZE SPONGE 4X4 12PLY STRL (GAUZE/BANDAGES/DRESSINGS) ×4 IMPLANT
GLOVE BIO SURGEON STRL SZ7.5 (GLOVE) ×1 IMPLANT
GLOVE BIO SURGEON STRL SZ8 (GLOVE) ×1 IMPLANT
GLOVE BIO SURGEON STRL SZ8.5 (GLOVE) ×3 IMPLANT
GLOVE BIOGEL PI IND STRL 8 (GLOVE) ×3 IMPLANT
GLOVE BIOGEL PI IND STRL 8.5 (GLOVE) ×1 IMPLANT
GLOVE BIOGEL PI INDICATOR 8 (GLOVE)
GLOVE BIOGEL PI INDICATOR 8.5 (GLOVE) ×2
GOWN PREVENTION PLUS XLARGE (GOWN DISPOSABLE) ×3 IMPLANT
GOWN SPEC L3 XXLG W/TWL (GOWN DISPOSABLE) ×3 IMPLANT
GOWN STRL REUS W/TWL LRG LVL3 (GOWN DISPOSABLE) ×1 IMPLANT
GOWN STRL REUS W/TWL XL LVL3 (GOWN DISPOSABLE) ×1 IMPLANT
GUIDEPIN 3.2X17.5 THRD DISP (PIN) ×2 IMPLANT
GUIDEWIRE BEAD TIP (WIRE) ×2 IMPLANT
HANDPIECE INTERPULSE COAX TIP (DISPOSABLE)
IMMOBILIZER KNEE 20 (SOFTGOODS)
IMMOBILIZER KNEE 20 THIGH 36 (SOFTGOODS) ×1 IMPLANT
IMMOBILIZER KNEE 22 UNIV (SOFTGOODS) ×2 IMPLANT
KIT BASIN OR (CUSTOM PROCEDURE TRAY) ×1 IMPLANT
LIQUID BAND (GAUZE/BANDAGES/DRESSINGS) ×6 IMPLANT
MANIFOLD NEPTUNE II (INSTRUMENTS) ×1 IMPLANT
NAIL FEM RETRO 12X380 (Nail) ×2 IMPLANT
NS IRRIG 1000ML POUR BTL (IV SOLUTION) ×4 IMPLANT
PACK ICE MAXI GEL EZY WRAP (MISCELLANEOUS) ×2 IMPLANT
PACK TOTAL JOINT (CUSTOM PROCEDURE TRAY) ×1 IMPLANT
PACK TOTAL KNEE CUSTOM (KITS) ×3 IMPLANT
PADDING CAST COTTON 6X4 STRL (CAST SUPPLIES) ×3 IMPLANT
POSITIONER SURGICAL ARM (MISCELLANEOUS) ×1 IMPLANT
SCREW CORT TI DBL LEAD 5X40 (Screw) ×2 IMPLANT
SCREW CORT TI DBL LEAD 5X56 (Screw) ×2 IMPLANT
SCREW CORT TI DBL LEAD 5X65 (Screw) ×2 IMPLANT
SCREW CORT TI DBL LEAD 5X75 (Screw) ×4 IMPLANT
SEALER BIPOLAR AQUA 6.0 (INSTRUMENTS) ×3 IMPLANT
SET HNDPC FAN SPRY TIP SCT (DISPOSABLE) ×1 IMPLANT
SET PAD KNEE POSITIONER (MISCELLANEOUS) ×3 IMPLANT
STAPLER VISISTAT 35W (STAPLE) ×4 IMPLANT
STRIP CLOSURE SKIN 1/2X4 (GAUZE/BANDAGES/DRESSINGS) ×1 IMPLANT
SUT ETHIBOND NAB CT1 #1 30IN (SUTURE) ×9 IMPLANT
SUT ETHILON 3 0 PS 1 (SUTURE) ×3 IMPLANT
SUT MNCRL AB 4-0 PS2 18 (SUTURE) ×1 IMPLANT
SUT MON AB 2-0 CT1 36 (SUTURE) ×3 IMPLANT
SUT VIC AB 0 CT1 27 (SUTURE)
SUT VIC AB 0 CT1 27XBRD ANTBC (SUTURE) ×1 IMPLANT
SUT VIC AB 1 CT1 27 (SUTURE) ×6
SUT VIC AB 1 CT1 27XBRD ANTBC (SUTURE) ×2 IMPLANT
SUT VIC AB 2-0 CT1 27 (SUTURE) ×6
SUT VIC AB 2-0 CT1 TAPERPNT 27 (SUTURE) ×3 IMPLANT
SUT VLOC 180 0 24IN GS25 (SUTURE) ×1 IMPLANT
TOWEL OR 17X26 10 PK STRL BLUE (TOWEL DISPOSABLE) ×8 IMPLANT
TRAY FOLEY BAG SILVER LF 14FR (CATHETERS) ×1 IMPLANT
TRAY FOLEY BAG SILVER LF 16FR (CATHETERS) ×1 IMPLANT
TRAY FOLEY W/METER SILVER 14FR (SET/KITS/TRAYS/PACK) IMPLANT
TRAY FOLEY W/METER SILVER 16FR (SET/KITS/TRAYS/PACK) IMPLANT
WATER STERILE IRR 1500ML POUR (IV SOLUTION) ×4 IMPLANT

## 2016-04-15 NOTE — Anesthesia Procedure Notes (Signed)
Procedure Name: Intubation Date/Time: 04/15/2016 5:14 PM Performed by: Carleene Cooper A Pre-anesthesia Checklist: Patient identified, Timeout performed, Emergency Drugs available, Suction available and Patient being monitored Patient Re-evaluated:Patient Re-evaluated prior to inductionOxygen Delivery Method: Circle system utilized Preoxygenation: Pre-oxygenation with 100% oxygen Intubation Type: IV induction Ventilation: Mask ventilation without difficulty Laryngoscope Size: Mac and 4 Grade View: Grade I Tube type: Oral Tube size: 7.5 mm Number of attempts: 2 Airway Equipment and Method: Stylet Placement Confirmation: ETT inserted through vocal cords under direct vision,  breath sounds checked- equal and bilateral and positive ETCO2 Secured at: 21 cm Tube secured with: Tape Dental Injury: Teeth and Oropharynx as per pre-operative assessment

## 2016-04-15 NOTE — Transfer of Care (Signed)
Immediate Anesthesia Transfer of Care Note  Patient: [name]  Procedure(s) Performed: Procedure(s): INTRAMEDULLARY (IM) RETROGRADE FEMORAL NAILING (Left)  Patient Location: PACU  Anesthesia Type:General  Level of Consciousness: awake, alert , oriented and patient cooperative  Airway & Oxygen Therapy: Patient Spontanous Breathing and Patient connected to face mask oxygen  Post-op Assessment: Report given to RN, Post -op Vital signs reviewed and stable and Patient moving all extremities  Post vital signs: Reviewed and stable  Last Vitals:  Filed Vitals:   04/15/16 1516 04/15/16 1612  BP: 128/66 131/83  Pulse: 103 106  Temp: 37.1 C 36.9 C  Resp: 20 20    Last Pain:  Filed Vitals:   04/15/16 1613  PainSc: 7       Patients Stated Pain Goal: 2 ([id number])  Complications: No apparent anesthesia complications

## 2016-04-15 NOTE — Telephone Encounter (Signed)
3rd attempt trying to reach pt no answer LMOM need to make hosp f/u [facility]...Johny Chess

## 2016-04-15 NOTE — Brief Op Note (Signed)
04/13/2016 - 04/15/2016  7:02 PM  PATIENT:  France Ravens  70 y.o. female  PRE-OPERATIVE DIAGNOSIS:  left distal femur fracture  POST-OPERATIVE DIAGNOSIS:  left distal femur  fracture  PROCEDURE:  Procedure(s): INTRAMEDULLARY (IM) RETROGRADE FEMORAL NAILING (Left)  SURGEON:  Surgeon(s) and Role:    * Gaynelle Arabian, MD - Primary  PHYSICIAN ASSISTANT:   ASSISTANTS: Arlee Muslim, PA-C   ANESTHESIA:   general  EBL:     BLOOD ADMINISTERED:none  DRAINS: (Medium) Hemovact drain(s) in the left knee with  Suction Open   LOCAL MEDICATIONS USED:  NONE  COUNTS:  YES  TOURNIQUET:   Total Tourniquet Time Documented: Thigh (Left) - 47 minutes Total: Thigh (Left) - 47 minutes   DICTATION: .Other Dictation: Dictation Number [id number]  PLAN OF CARE: Admit to inpatient   PATIENT DISPOSITION:  PACU - hemodynamically stable.

## 2016-04-15 NOTE — Interval H&P Note (Signed)
History and Physical Interval Note:  04/15/2016 5:02 PM  [name]  has presented today for surgery, with the diagnosis of left hip fracture  The various methods of treatment have been discussed with the patient and family. After consideration of risks, benefits and other options for treatment, the patient has consented to  Procedure(s): INTRAMEDULLARY (IM) RETROGRADE FEMORAL NAILING (Left) as a surgical intervention .  The patient's history has been reviewed, patient examined, no change in status, stable for surgery.  I have reviewed the patient's chart and labs.  Questions were answered to the patient's satisfaction.     Gearlean Alf

## 2016-04-15 NOTE — Progress Notes (Signed)
Subjective: C/O left knee pain   Objective: Vital signs in last 24 hours: Temp:  [98.6 F (37 C)-99.2 F (37.3 C)] 98.6 F (37 C) (06/05 0542) Pulse Rate:  [96-103] 96 (06/05 0542) Resp:  [18-19] 18 (06/05 0542) BP: (122-145)/(66-79) 134/73 mmHg (06/05 0542) SpO2:  [98 %-100 %] 100 % (06/05 0542) Weight:  [63.504 kg (140 lb)] 63.504 kg (140 lb) (06/04 1621)  Intake/Output from previous day: 06/04 0701 - 06/05 0700 In: 2731.7 [P.O.:240; I.V.:2491.7] Out: 2150 [Urine:2150] Intake/Output this shift:     Recent Labs  04/13/16 0402 04/13/16 1011 04/14/16 0519  HGB 7.9* 7.3* 9.4*    Recent Labs  04/13/16 0402 04/13/16 1011 04/14/16 0519  WBC 9.8  --  11.2*  RBC 2.79*  --  3.30*  HCT 24.8* 22.8* 28.2*  PLT 431*  --  435*    Recent Labs  04/13/16 0402 04/14/16 0519  NA 139 137  K 3.5 3.9  CL 106 107  CO2 25 23  BUN 13 11  CREATININE 0.61 0.43*  GLUCOSE 122* 126*  CALCIUM 8.3* 8.0*    Recent Labs  04/13/16 0402  INR 1.09    Neurovascular intact Compartment soft  Assessment/Plan: Left distal femur fracture- Plan ORIF this afternoon. Discussed in detail with patient who elects to proceed   [name] V 04/15/2016, 7:05 AM

## 2016-04-15 NOTE — H&P (View-Only) (Signed)
Reason for Consult: Left leg fracture Referring Physician: EDP  [name] is an 70 y.o. female.  HPI: Larey Seat down steps this AM  Past Medical History  Diagnosis Date  . Seasonal allergies   . Hypercholesteremia     under control  . GERD (gastroesophageal reflux disease)     occasional  . Arthritis     back-severe, hips, right knee  . H/O mumps   . H/O measles   . Insomnia   . Asthma   . Allergy   . Renal cell carcinoma 2012    left  . Psoriasis (a type of skin inflammation)   . Alcohol abuse   . Alcohol abuse     Past Surgical History  Procedure Laterality Date  . Kidney surgery  12/2010    [facility]; partial nephrectomy  . Hernia repair  07/2011    supraumbilical repair  . Bunionectomy  04/2011  . Menisectomy  2010    left knee  . Cholecystectomy  11/13/2011    Procedure: LAPAROSCOPIC CHOLECYSTECTOMY WITH INTRAOPERATIVE CHOLANGIOGRAM;  Surgeon: Rulon Abide, DO;  Location: [facility];  Service: General;  Laterality: N/A;  . Esophagogastroduodenoscopy N/A 09/25/2013    Procedure: ESOPHAGOGASTRODUODENOSCOPY (EGD);  Surgeon: Shirley Friar, MD;  Location: [facility];  Service: Endoscopy;  Laterality: N/A;  . Colonoscopy N/A 09/25/2013    Procedure: COLONOSCOPY;  Surgeon: Shirley Friar, MD;  Location: [facility];  Service: Endoscopy;  Laterality: N/A;  . Abdominal hysterectomy  40years ago  . Tubal ligation  44 years ago  . Lumbar laminectomy/decompression microdiscectomy Right 10/12/2014    Procedure: HEMI LAMINECTOMY MICRODISCECTOMY L5-S1 RIGHT (1 LEVEL);  Surgeon: Jacki Cones, MD;  Location: [facility];  Service: Orthopedics;  Laterality: Right;  . Esophagogastroduodenoscopy (egd) with propofol Left 01/03/2016    Procedure: ESOPHAGOGASTRODUODENOSCOPY (EGD) WITH PROPOFOL;  Surgeon: Willis Modena, MD;  Location: [facility];  Service: Endoscopy;  Laterality: Left;  . Esophagogastroduodenoscopy N/A 04/07/2016    Procedure: ESOPHAGOGASTRODUODENOSCOPY (EGD);   Surgeon: Rachael Fee, MD;  Location: [facility];  Service: Endoscopy;  Laterality: N/A;    Family History  Problem Relation Age of Onset  . Hyperlipidemia Mother   . Hypertension Mother     Social History:  reports that she has been smoking Cigarettes.  She has a .9 pack-year smoking history. She has never used smokeless tobacco. She reports that she drinks alcohol. She reports that she does not use illicit drugs.  Allergies:  Allergies  Allergen Reactions  . Azithromycin Itching and Swelling  . Morphine And Related Itching    Medications: I have reviewed the patient's current medications.  Results for orders placed or performed during the hospital encounter of 04/13/16 (from the past 48 hour(s))  CBC with Differential     Status: Abnormal   Collection Time: 04/13/16  4:02 AM  Result Value Ref Range   WBC 9.8 4.0 - 10.5 K/uL   RBC 2.79 (L) 3.87 - 5.11 MIL/uL   Hemoglobin 7.9 (L) 12.0 - 15.0 g/dL   HCT 29.8 (L) 59.0 - 34.1 %   MCV 88.9 78.0 - 100.0 fL   MCH 28.3 26.0 - 34.0 pg   MCHC 31.9 30.0 - 36.0 g/dL   RDW 88.8 (H) 54.1 - 82.5 %   Platelets 431 (H) 150 - 400 K/uL   Neutrophils Relative % 59 %   Neutro Abs 5.8 1.7 - 7.7 K/uL   Lymphocytes Relative 28 %   Lymphs Abs 2.7 0.7 - 4.0 K/uL  Monocytes Relative 10 %   Monocytes Absolute 1.0 0.1 - 1.0 K/uL   Eosinophils Relative 3 %   Eosinophils Absolute 0.3 0.0 - 0.7 K/uL   Basophils Relative 0 %   Basophils Absolute 0.0 0.0 - 0.1 K/uL  Basic metabolic panel     Status: Abnormal   Collection Time: 04/13/16  4:02 AM  Result Value Ref Range   Sodium 139 135 - 145 mmol/L   Potassium 3.5 3.5 - 5.1 mmol/L   Chloride 106 101 - 111 mmol/L   CO2 25 22 - 32 mmol/L   Glucose, Bld 122 (H) 65 - 99 mg/dL   BUN 13 6 - 20 mg/dL   Creatinine, Ser 0.61 0.44 - 1.00 mg/dL   Calcium 8.3 (L) 8.9 - 10.3 mg/dL   GFR calc non Af Amer >60 >60 mL/min   GFR calc Af Amer >60 >60 mL/min    Comment: (NOTE) The eGFR has been calculated  using the CKD EPI equation. This calculation has not been validated in all clinical situations. eGFR's persistently <60 mL/min signify possible Chronic Kidney Disease.    Anion gap 8 5 - 15  Protime-INR     Status: None   Collection Time: 04/13/16  4:02 AM  Result Value Ref Range   Prothrombin Time 13.9 11.6 - 15.2 seconds   INR 1.09 0.00 - 1.49  Type and screen     Status: None   Collection Time: 04/13/16  4:02 AM  Result Value Ref Range   ABO/RH(D) O POS    Antibody Screen NEG    Sample Expiration 04/16/2016   Urinalysis, Routine w reflex microscopic (not at [facility])     Status: None   Collection Time: 04/13/16  4:36 AM  Result Value Ref Range   Color, Urine YELLOW YELLOW   APPearance CLEAR CLEAR   Specific Gravity, Urine 1.019 1.005 - 1.030   pH 6.5 5.0 - 8.0   Glucose, UA NEGATIVE NEGATIVE mg/dL   Hgb urine dipstick NEGATIVE NEGATIVE   Bilirubin Urine NEGATIVE NEGATIVE   Ketones, ur NEGATIVE NEGATIVE mg/dL   Protein, ur NEGATIVE NEGATIVE mg/dL   Nitrite NEGATIVE NEGATIVE   Leukocytes, UA NEGATIVE NEGATIVE    Comment: MICROSCOPIC NOT DONE ON URINES WITH NEGATIVE PROTEIN, BLOOD, LEUKOCYTES, NITRITE, OR GLUCOSE <1000 mg/dL.    Dg Chest 1 View  04/13/2016  CLINICAL DATA:  Status post fall. Preoperative chest radiograph. Initial encounter. EXAM: CHEST 1 VIEW COMPARISON:  Chest radiograph performed 02/28/2014 FINDINGS: The patient was imaged in decubitus position due to limitations in positioning. Pulmonary vascularity is at the upper limits of normal. Minimal scarring is noted at the left lung base. Trace right-sided pleural fluid is suggested. No pneumothorax identified. The cardiomediastinal silhouette is normal in size. No acute osseous abnormalities are identified. IMPRESSION: Suggestion of trace right-sided pleural fluid. Minimal left basilar scarring. Lungs otherwise clear. Electronically Signed   By: Garald Balding M.D.   On: 04/13/2016 04:02   Dg Femur Min 2 Views  Left  04/13/2016  CLINICAL DATA:  Twisted left leg with fall.  Initial encounter. EXAM: LEFT FEMUR 2 VIEWS COMPARISON:  None. FINDINGS: Comminuted fracturing of the supracondylar femur with dorsal and medial displacement. No visible intercondylar extension, but limited study due to patient positioning. No evidence of knee joint effusion. Severe lower lumbar disc disease with known erosive changes and sclerosis at L5-S1. IMPRESSION: Comminuted and displaced supracondylar femur fracture. No intercondylar component is seen but this is a limited study. Electronically Signed  By: Monte Fantasia M.D.   On: 04/13/2016 04:01    Review of Systems  Musculoskeletal: Positive for joint pain.  All other systems reviewed and are negative.  Blood pressure 116/64, pulse 101, temperature 98 F (36.7 C), temperature source Oral, resp. rate 22, SpO2 95 %. Physical Exam  Constitutional: She is oriented to person, place, and time. She appears well-developed.  HENT:  Head: Normocephalic.  Eyes: Pupils are equal, round, and reactive to light.  Neck: Normal range of motion.  Cardiovascular: Normal rate.   Respiratory: Effort normal.  GI: Soft.  Musculoskeletal: She exhibits edema and tenderness.  Left leg in immobilizer. Compartments soft. 2+ pulses. Motor DF PF 5/5. Sensation intact.  Neurological: She is alert and oriented to person, place, and time.  Skin: Skin is warm and dry.  Psychiatric: She has a normal mood and affect.    Assessment/Plan:  Comminuted left distal femur fracture. Recent GI bleed. Knee immobilizer. Plan ORIF. Will discuss with Dr. Lyla Glassing. Repeat H/H at 10 AM. Possible transfusion. Risks discussed. Admit to Med Service. Robaxin for spasm.   Elaynah Virginia C 04/13/2016, 5:51 AM
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
## 2016-04-15 NOTE — Progress Notes (Addendum)
PROGRESS NOTE    [name]  [id number] DOB: October 11, 1946 DOA: 04/13/2016 PCP: [name], FNP  Brief Narrative:  [name] is a 70 y.o. female with a past medical history significant for alcohol use, ?cirrhosis, old hx of RCC, asthnma, chronic back pain, and recent UGIB with blood loss anemia who presented with hip pain after a fall. Ortho consulted, for OR today  Assessment & Plan:   1. Distal femur fracture: -CT shows "Comminuted, angulated and displaced fracture of the distal femoral diaphysis" with "extension into the metaphysis with involvement of both femoral epicondyles, but no intercondylar extension or involvement of the weight-bearing articular surface." -pain control, continue morphine , robaxin PRN -Ortho consult appreciated, plan for OR today - due to recent GI bleeding/cirrhosis and coagulopathy, not good candidate for anticoagulation/DVT proph  2. Anemia, from chronic disaease and acute blood loss: -Hgb 7.9 g/dL --> 9.4 g/dL.   -SCDs -s/p 2units PRBC 6/4  3. Alcohol use: CIWA scores low -continue CIWA protocol , denies ETOH use after recent Discharge -Thiamine and folate  4. Asthma: -stable, Continue home Advair and albuterol and Singulair  5. Depression: -Continue Cymbalta and Paxil  6. Smoking: Cessation was encouraged. -Nicotine patch  7. Leukocytosis -reactive likely, no fevers, monitor clinically  8. Cirrhosis -stable, FU with GI  DVT prophylaxis: SCDs Code Status: FULL Family Communication: None present Disposition Plan: SNF soon, 2days?  Consultants:   Orthopedics  Procedures:   None  Antimicrobials:   None    Subjective: Some pain but mostly controlled Objective: Filed Vitals:   04/14/16 2232 04/15/16 0200 04/15/16 0542 04/15/16 0907  BP: 122/79 132/66 134/73   Pulse: 103 99 96   Temp: 98.9 F (37.2 C) 99.2 F (37.3 C) 98.6 F (37 C)   TempSrc: Oral Oral Oral   Resp: 19 18 18    Height:      Weight:      SpO2:  100% 99% 100% 100%    Intake/Output Summary (Last 24 hours) at 04/15/16 1212 Last data filed at 04/15/16 0655  Gross per 24 hour  Intake 2731.67 ml  Output   2150 ml  Net 581.67 ml   Filed Weights   04/14/16 1621  Weight: 63.504 kg (140 lb)    Examination:  General exam: Appears calm and comfortable  Respiratory system: Clear to auscultation. Respiratory effort normal. Cardiovascular system: S1 & S2 heard, RRR. No JVD, murmurs, rubs, gallops or clicks. No pedal edema. Gastrointestinal system: Abdomen is nondistended, soft and nontender. No organomegaly or masses felt. Normal bowel sounds heard. Central nervous system: Alert and oriented. No focal neurological deficits. Extremities: LEFT leg in immobilizer and distal swelling noted Skin: No rashes, lesions or ulcers Psychiatry: Judgement and insight appear normal. Mood & affect appropriate.     Data Reviewed: I have personally reviewed following labs and imaging studies  CBC:  Recent Labs Lab 04/09/16 0459 04/10/16 0951 04/13/16 0402 04/13/16 1011 04/14/16 0519 04/15/16 0739  WBC 13.2*  --  9.8  --  11.2* 15.0*  NEUTROABS  --   --  5.8  --   --   --   HGB 8.0* 9.2* 7.9* 7.3* 9.4* 9.6*  HCT 24.7*  --  24.8* 22.8* 28.2* 29.4*  MCV 87.6  --  88.9  --  85.5 87.2  PLT 282  --  431*  --  435* [id number]*   Basic Metabolic Panel:  Recent Labs Lab 04/09/16 0459 04/13/16 0402 04/14/16 0519  NA 139 139 137  K 3.9 3.5 3.9  CL 111 106 107  CO2 23 25 23   GLUCOSE 125* 122* 126*  BUN 8 13 11   CREATININE 0.52 0.61 0.43*  CALCIUM 8.1* 8.3* 8.0*  MG 1.6*  --   --    GFR: Estimated Creatinine Clearance: 68.4 mL/min (by C-G formula based on Cr of 0.43). Liver Function Tests: No results for input(s): AST, ALT, ALKPHOS, BILITOT, PROT, ALBUMIN in the last 168 hours. No results for input(s): LIPASE, AMYLASE in the last 168 hours. No results for input(s): AMMONIA in the last 168 hours. Coagulation Profile:  Recent Labs Lab  04/13/16 0402  INR 1.09   Cardiac Enzymes: No results for input(s): CKTOTAL, CKMB, CKMBINDEX, TROPONINI in the last 168 hours. BNP (last 3 results) No results for input(s): PROBNP in the last 8760 hours. HbA1C: No results for input(s): HGBA1C in the last 72 hours. CBG: No results for input(s): GLUCAP in the last 168 hours. Lipid Profile: No results for input(s): CHOL, HDL, LDLCALC, TRIG, CHOLHDL, LDLDIRECT in the last 72 hours. Thyroid Function Tests: No results for input(s): TSH, T4TOTAL, FREET4, T3FREE, THYROIDAB in the last 72 hours. Anemia Panel: No results for input(s): VITAMINB12, FOLATE, FERRITIN, TIBC, IRON, RETICCTPCT in the last 72 hours. Urine analysis:    Component Value Date/Time   COLORURINE YELLOW 04/13/2016 0436   APPEARANCEUR CLEAR 04/13/2016 0436   LABSPEC 1.019 04/13/2016 0436   PHURINE 6.5 04/13/2016 0436   GLUCOSEU NEGATIVE 04/13/2016 0436   HGBUR NEGATIVE 04/13/2016 0436   HGBUR negative 07/07/2007 0838   BILIRUBINUR NEGATIVE 04/13/2016 0436   KETONESUR NEGATIVE 04/13/2016 0436   PROTEINUR NEGATIVE 04/13/2016 0436   UROBILINOGEN 1.0 04/22/2015 0020   NITRITE NEGATIVE 04/13/2016 0436   LEUKOCYTESUR NEGATIVE 04/13/2016 0436   Sepsis Labs: @LABRCNTIP (procalcitonin:4,lacticidven:4)  ) Recent Results (from the past 240 hour(s))  MRSA PCR Screening     Status: None   Collection Time: 04/06/16  1:20 AM  Result Value Ref Range Status   MRSA by PCR NEGATIVE NEGATIVE Final    Comment:        The GeneXpert MRSA Assay (FDA approved for NASAL specimens only), is one component of a comprehensive MRSA colonization surveillance program. It is not intended to diagnose MRSA infection nor to guide or monitor treatment for MRSA infections.   Surgical PCR screen     Status: None   Collection Time: 04/13/16  6:31 PM  Result Value Ref Range Status   MRSA, PCR NEGATIVE NEGATIVE Final   Staphylococcus aureus NEGATIVE NEGATIVE Final    Comment:        The  Xpert SA Assay (FDA approved for NASAL specimens in patients over 80 years of age), is one component of a comprehensive surveillance program.  Test performance has been validated by [facility] for patients greater than or equal to 75 year old. It is not intended to diagnose infection nor to guide or monitor treatment.          Radiology Studies: No results found.      Scheduled Meds: . DULoxetine  60 mg Oral Daily  . feeding supplement (ENSURE ENLIVE)  237 mL Oral BID BM  . ferrous sulfate  325 mg Oral TID PC  . folic acid  1 mg Oral Daily  . mometasone-formoterol  2 puff Inhalation BID  . montelukast  10 mg Oral QHS  . multivitamin with minerals  1 tablet Oral Daily  . nicotine  21 mg Transdermal Daily  . pantoprazole  40 mg  Oral BID  . PARoxetine  5 mg Oral Daily  . potassium chloride SA  20 mEq Oral BID WC  . simvastatin  40 mg Oral q morning - 10a  . sucralfate  1 g Oral TID WC & HS  . thiamine  100 mg Oral Daily   Continuous Infusions: . sodium chloride 100 mL/hr at 04/15/16 1024     LOS: 2 days    Time spent: 25    Lataya Varnell, MD [facility] Pager [phone]  If 7PM-7AM, please contact night-coverage [email or web address] Password [email or web address] 04/15/2016, 12:12 PM

## 2016-04-16 ENCOUNTER — Inpatient Hospital Stay (HOSPITAL_COMMUNITY): Payer: Medicare HMO

## 2016-04-16 ENCOUNTER — Encounter (HOSPITAL_COMMUNITY): Payer: Self-pay | Admitting: Orthopedic Surgery

## 2016-04-16 DIAGNOSIS — M549 Dorsalgia, unspecified: Secondary | ICD-10-CM

## 2016-04-16 DIAGNOSIS — G8929 Other chronic pain: Secondary | ICD-10-CM

## 2016-04-16 DIAGNOSIS — R609 Edema, unspecified: Secondary | ICD-10-CM

## 2016-04-16 LAB — BASIC METABOLIC PANEL
ANION GAP: 8 (ref 5–15)
BUN: 11 mg/dL (ref 6–20)
CO2: 22 mmol/L (ref 22–32)
Calcium: 7.9 mg/dL — ABNORMAL LOW (ref 8.9–10.3)
Chloride: 104 mmol/L (ref 101–111)
Creatinine, Ser: 0.57 mg/dL (ref 0.44–1.00)
GFR calc Af Amer: >60 mL/min (ref >60)
GLUCOSE: 238 mg/dL — AB (ref 65–99)
POTASSIUM: 4.1 mmol/L (ref 3.5–5.1)
Sodium: 134 mmol/L — ABNORMAL LOW (ref 135–145)

## 2016-04-16 LAB — CBC
HEMATOCRIT: 22.3 % — AB (ref 36.0–46.0)
HEMOGLOBIN: 7 g/dL — AB (ref 12.0–15.0)
MCH: 27.8 pg (ref 26.0–34.0)
MCHC: 31.4 g/dL (ref 30.0–36.0)
MCV: 88.5 fL (ref 78.0–100.0)
Platelets: 454 10*3/uL — ABNORMAL HIGH (ref 150–400)
RBC: 2.52 MIL/uL — AB (ref 3.87–5.11)
RDW: 18.2 % — ABNORMAL HIGH (ref 11.5–15.5)
WBC: 11.8 10*3/uL — ABNORMAL HIGH (ref 4.0–10.5)

## 2016-04-16 LAB — PREPARE RBC (CROSSMATCH)

## 2016-04-16 MED ORDER — OXYCODONE HCL 5 MG PO TABS
10.0000 mg | ORAL_TABLET | ORAL | Status: DC | PRN
  Administered 2016-04-16: 15 mg via ORAL
  Filled 2016-04-16: qty 3

## 2016-04-16 MED ORDER — ACETAMINOPHEN 500 MG PO TABS
500.0000 mg | ORAL_TABLET | Freq: Four times a day (QID) | ORAL | Status: DC
  Administered 2016-04-16 – 2016-04-17 (×4): 500 mg via ORAL
  Filled 2016-04-16 (×5): qty 1

## 2016-04-16 MED ORDER — SENNOSIDES-DOCUSATE SODIUM 8.6-50 MG PO TABS
1.0000 | ORAL_TABLET | Freq: Two times a day (BID) | ORAL | Status: DC
  Administered 2016-04-16 – 2016-04-18 (×5): 1 via ORAL
  Filled 2016-04-16 (×6): qty 1

## 2016-04-16 MED ORDER — DIPHENHYDRAMINE HCL 25 MG PO CAPS
25.0000 mg | ORAL_CAPSULE | Freq: Four times a day (QID) | ORAL | Status: AC | PRN
  Administered 2016-04-16 – 2016-04-17 (×2): 25 mg via ORAL
  Filled 2016-04-16 (×2): qty 1

## 2016-04-16 MED ORDER — HYDROMORPHONE HCL 1 MG/ML IJ SOLN
1.0000 mg | INTRAMUSCULAR | Status: DC | PRN
  Administered 2016-04-16 – 2016-04-18 (×8): 2 mg via INTRAVENOUS
  Filled 2016-04-16 (×8): qty 2

## 2016-04-16 MED ORDER — SODIUM CHLORIDE 0.9 % IV SOLN: Freq: Once | INTRAVENOUS | Status: DC

## 2016-04-16 MED ORDER — DIPHENHYDRAMINE HCL 25 MG PO CAPS: 25.0000 mg | ORAL_CAPSULE | Freq: Four times a day (QID) | ORAL | Status: DC | PRN

## 2016-04-16 MED ORDER — FUROSEMIDE 10 MG/ML IJ SOLN
20.0000 mg | Freq: Once | INTRAMUSCULAR | Status: AC
  Administered 2016-04-16: 20 mg via INTRAVENOUS
  Filled 2016-04-16: qty 2

## 2016-04-16 NOTE — Care Management Important Message (Signed)
Important Message  Patient Details  Name: [name] MRN: [id number] Date of Birth: Jul 24, 1946   Medicare Important Message Given:  Yes    Camillo Flaming 04/16/2016, 9:18 AMImportant Message  Patient Details  Name: [name] MRN: [id number] Date of Birth: 1946-06-08   Medicare Important Message Given:  Yes    Camillo Flaming 04/16/2016, 9:18 AM

## 2016-04-16 NOTE — Op Note (Signed)
NAMESHONTRELL, FEDOROV NO.:  [id number]  MEDICAL RECORD NO.:  [id number]  LOCATION:  [id number]                         FACILITY:  [facility]  PHYSICIAN:  Gaynelle Arabian, M.D.    DATE OF BIRTH:  1946/03/24  DATE OF PROCEDURE:  04/15/2016 DATE OF DISCHARGE:                              OPERATIVE REPORT   PREOPERATIVE DIAGNOSIS:  Comminuted left distal femur fracture.  POSTOPERATIVE DIAGNOSIS:  Comminuted left distal femur fracture.  PROCEDURE:  Retrograde intramedullary nailing of distal femur fracture.  SURGEON:  Gaynelle Arabian, MD  ASSISTANT:  Alexzandrew L. Dara Lords, PA-C  ANESTHESIA:  General.  ESTIMATED BLOOD LOSS:  150.  DRAIN:  Hemovac x1.  COMPLICATIONS:  None.  TOURNIQUET TIME:  47 minutes at 300 mmHg.  CONDITION:  Stable to recovery.  BRIEF CLINICAL NOTE:  Ms. Newlon is a 70 year old female, who had a bad fall 3 nights ago and landing on a flexed knee with a grossly comminuted left distal femur fracture.  She has now been cleared medically and presents for open reduction and internal fixation with a retrograde IM nail.  PROCEDURE IN DETAIL:  After successful administration of general anesthetic, the patient's left lower extremity was isolated from perineum with plastic drapes and prepped and draped in usual sterile fashion.  Sterile tourniquet was placed around the proximal thigh. Extremities wrapped in Esmarch, and tourniquet inflated to 300 mmHg.  A midline knee incision was made starting just at the tibial tubercle coursing proximally for about 6 cm above the superior pole of patella. Skin was cut with 10 blade through subcutaneous tissue through the extensor mechanism.  A fresh blade was used to make a medial parapatellar arthrotomy.  The patella was subluxed laterally.  The knee joint was exposed.  The fracture was identified.  It was grossly comminuted multiple butterfly fragments just in the supracondylar area. Attempts were made to reduce the  fragments together would just lead to collapse of the fragments.  We decided to place the nail 1st to better get the fracture in alignment.  The starter drill for the Biomet Polaris nail was placed just above the intercondylar notch anteriorly.  It was drilled into down the distal femur.  The starter reamer was then passed over the guide pin.  Long guide rod was passed through this hole and up across the fracture into the proximal fragment, two were applied at the lesser trochanter.  The length of this was 38 mm.  We reamed over the guide rod up to 13.5 mm with placement of the 12 mm diameter nail.  The 12 x 38 Polaris Biomet nail was then passed retrograde through the knee up into the femur. Once out the length, the fracture lines were perfectly on the AP and then the distal femur was just slightly flexed and the lateral tremendously improved from the preoperative alignment.  This also effectively brought the butterfly fragments back into better position. Through the external guide, I then placed 4 interlocks into the distal femur, 2 transverse, and 2 oblique.  We then locked them into the nail with the screwdriver.  The external guide was then removed.  The fracture was identified  in AP and lateral planes.  I was very pleased with the reduction.  I was very pleased with the alignment of butterfly fragment, did not need to put any cables around them to hold them in position.  I then thoroughly irrigated the knee and closed the arthrotomy over Hemovac drain with a running #1 V-Loc suture.  I then closed the interlocks with staples.  The tourniquet was then released, total time of 47 minutes.  The proximal interlock which was distal on the nail and proximal on the femur was then addressed.  Under fluoroscopic guidance, we found the perfect circular hole for the interlock and I made a small incision in the proximal thigh.  A drill was then passed, so that would go under fluoroscopic  guidance through the interlock hole.  We drilled 40 mm to the appropriate length.  The screw was placed.  At the end, the screw was a little long, but I left it in place because it had excellent purchase and it locked the nail rotationally.  This wound was irrigated and closed with interrupted 2-0 Vicryl and staples.  The remainder of the knee incision was closed with interrupted 2-0 Vicryl, and staples.  Incisions were cleaned and dried and bulky sterile dressing applied.  She was placed into knee immobilizer, awakened, and transported to recovery in stable condition.  Note that a surgical assistant was a medical necessity for this very complex procedure.  Assistant was necessary to hold traction while the hardware was being placed, also to provide retraction so we can avoid neurovascular structures while reducing this fracture.     Gaynelle Arabian, M.D.     FA/MEDQ  D:  04/15/2016  T:  04/16/2016  Job:  [id number]

## 2016-04-16 NOTE — Progress Notes (Signed)
VASCULAR LAB PRELIMINARY  PRELIMINARY  PRELIMINARY  PRELIMINARY  Bilateral lower extremity venous duplex completed.     Left:  No evidence of DVT, superficial thrombosis, or Baker's cyst. Gave result to patients nurse.  Janifer Adie, RVT, RDMS 04/16/2016, 4:11 PM

## 2016-04-16 NOTE — Progress Notes (Signed)
OT Cancellation Note  Patient Details Name: ANAIDA CEJA MRN: [id number] DOB: 15-Jun-1946   Cancelled Treatment:    Reason Eval/Treat Not Completed: Pain limiting ability to participate  Will recheck on pt  Kari Baars, [place]  Payton Mccallum D 04/16/2016, 10:29 AM

## 2016-04-16 NOTE — Progress Notes (Signed)
PT Cancellation Note  Patient Details Name: [name] MRN: [id number] DOB: 13-Nov-1945   Cancelled Treatment:    Reason Eval/Treat Not Completed: Pain limiting ability to participate (placed ice and requested muscle relaxer. will check back after meds helping.)Note Hgb 7.0, MD ok's to mobilize as tolerated.    Claretha Cooper 04/16/2016, 9:30 AM  Tresa Endo PT [phone]

## 2016-04-16 NOTE — Progress Notes (Signed)
PT Cancellation Note  Patient Details Name: CHERRIL SHAWN MRN: [id number] DOB: [birth year]   Cancelled Treatment:    Reason Eval/Treat Not Completed: Medical issues which prohibited therapy (to R/O DVT . will check back 04/17/16.)Also getting 2 units of blood and pain uncontrolled.   Marcelino Freestone PT [id number]  04/16/2016, 12:30 PM

## 2016-04-16 NOTE — Progress Notes (Signed)
LCSWA met with patient about SNFvs. HomeHealth. Patient reported she just had her surgery and is in too much pain. Patient stated she has not made her decision at this time and would like to speak with  the MD first. Patient has declined PT and OT at this time.    LCSWA will continue to follow patient for disposition.   , LCSWA, MSW Clinical Social Worker 5E and Psychiatric Service Line [phone] 04/16/2016  11:38 AM 

## 2016-04-16 NOTE — Progress Notes (Addendum)
PROGRESS NOTE    [name]  [id number] DOB: April 22, 1946 DOA: 04/13/2016 PCP: [name], FNP  Brief Narrative:  [name] is a 70 y.o. 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 Distal femur fracture: -CT shows "Comminuted, angulated and displaced fracture of the distal femoral diaphysis" with "extension into the metaphysis with involvement of both femoral epicondyles" -pain control, continue morphine , robaxin PRN -Ortho consult appreciated, -s/p IM nail 6/5, started on Lovenox for DVT proph by Ortho today, low threshold to stop -due to recent GI bleeding/cirrhosis and coagulopathy, may not good candidate for anticoagulation/DVT proph post discharge -PT eval today  2. Anemia, from chronic disease and Recent GI bleed/acute blood loss and post op blood loss - Hb was down to 7.9 on 6/4, given 2units PRBC by Dr.Danford - today-POD1 down to 7, also getting IVF at 100cc/hr, from OR-clinically suspect post op blood loss and dilution as causes now -give 2units PRBC stop IVF - no ongoing bleeding now, last Bm 2days ago and BUN is 11, so not not suspect active GIB now  3. Alcohol use: -CIWA scores low -continue CIWA protocol , denies ETOH use after recent Discharge -Thiamine and folate  4. Recent UGI Bleed -due to 2 gastric ulcers, s/p 4 units PRBC last admission -continue PPI  5.  Asthma: -stable, Continue home Advair and albuterol and Singulair  6. Depression: -Continue Cymbalta and Paxil  7. Smoking: Cessation was encouraged. -Nicotine patch  7.  Leukocytosis -reactive likely, no fevers, monitor clinically  8. Cirrhosis-suspected to be due to heavy alcoholism -stable, FU with GI -small varices, protal gastropathy  noted on EGD 5/28  9. Distal L leg swelling -likely due to fracture and immobilizer -will check duplex, r/o DVT  DVT prophylaxis: lovenox started today per Ortho Code Status: FULL Family Communication: None present Disposition Plan: Home vs SNF soon, 1-2days?  Consultants:   Orthopedics  Procedures:   None  Antimicrobials:   None    Subjective: Some pain but mostly controlled Objective: Filed Vitals:   04/15/16 2018 04/16/16 0011 04/16/16 0453 04/16/16 0954  BP: 136/82 121/65 104/57   Pulse: 102 104 103   Temp: 98.4 F (36.9 C) 98.4 F (36.9 C) 98.4 F (36.9 C)   TempSrc:  Oral Oral   Resp: 16 16 18    Height:      Weight:      SpO2: 100% 100% 100% 98%    Intake/Output Summary (Last 24 hours) at 04/16/16 1009 Last data filed at 04/16/16 0026  Gross per 24 hour  Intake   2600 ml  Output    495 ml  Net   2105 ml   Filed Weights   04/14/16 1621  Weight: 63.504 kg (140 lb)    Examination:  General exam: Appears calm and comfortable  Respiratory system: Clear to auscultation. Respiratory effort normal. Cardiovascular system: S1 & S2 heard, RRR. No JVD, murmurs, rubs, gallops or clicks. Gastrointestinal system: Abdomen is nondistended, soft and nontender. No organomegaly or masses felt. Normal  bowel sounds heard. Central nervous system: Alert and oriented. No focal neurological deficits. Extremities: LEFT leg in immobilizer and distal 2+swelling noted Skin: No rashes, lesions or ulcers Psychiatry: Judgement and insight appear normal. Mood & affect appropriate.     Data Reviewed: I have personally reviewed following labs and imaging studies  CBC:  Recent Labs Lab 04/13/16 0402 04/13/16 1011 04/14/16 0519 04/15/16 0739 04/16/16 0522  WBC 9.8  --  11.2* 15.0* 11.8*  NEUTROABS  5.8  --   --   --   --   HGB 7.9* 7.3* 9.4* 9.6* 7.0*  HCT 24.8* 22.8* 28.2* 29.4* 22.3*  MCV 88.9  --  85.5 87.2 88.5  PLT 431*  --  435* 503* [id number]*   Basic Metabolic Panel:  Recent Labs Lab 04/13/16 0402 04/14/16 0519 04/16/16 0522  NA 139 137 134*  K 3.5 3.9 4.1  CL 106 107 104  CO2 25 23 22   GLUCOSE 122* 126* 238*  BUN 13 11 11   CREATININE 0.61 0.43* 0.57  CALCIUM 8.3* 8.0* 7.9*   GFR: Estimated Creatinine Clearance: 68.4 mL/min (by C-G formula based on Cr of 0.57). Liver Function Tests: No results for input(s): AST, ALT, ALKPHOS, BILITOT, PROT, ALBUMIN in the last 168 hours. No results for input(s): LIPASE, AMYLASE in the last 168 hours. No results for input(s): AMMONIA in the last 168 hours. Coagulation Profile:  Recent Labs Lab 04/13/16 0402  INR 1.09   Cardiac Enzymes: No results for input(s): CKTOTAL, CKMB, CKMBINDEX, TROPONINI in the last 168 hours. BNP (last 3 results) No results for input(s): PROBNP in the last 8760 hours. HbA1C: No results for input(s): HGBA1C in the last 72 hours. CBG: No results for input(s): GLUCAP in the last 168 hours. Lipid Profile: No results for input(s): CHOL, HDL, LDLCALC, TRIG, CHOLHDL, LDLDIRECT in the last 72 hours. Thyroid Function Tests: No results for input(s): TSH, T4TOTAL, FREET4, T3FREE, THYROIDAB in the last 72 hours. Anemia Panel: No results for input(s): VITAMINB12, FOLATE, FERRITIN, TIBC, IRON, RETICCTPCT in the last 72 hours. Urine analysis:    Component Value Date/Time   COLORURINE YELLOW 04/13/2016 0436   APPEARANCEUR CLEAR 04/13/2016 0436   LABSPEC 1.019 04/13/2016 0436   PHURINE 6.5 04/13/2016 0436   GLUCOSEU NEGATIVE 04/13/2016 0436   HGBUR NEGATIVE 04/13/2016 0436   HGBUR negative 07/07/2007 0838   BILIRUBINUR NEGATIVE 04/13/2016 0436   KETONESUR NEGATIVE 04/13/2016 0436   PROTEINUR NEGATIVE 04/13/2016 0436   UROBILINOGEN 1.0 04/22/2015 0020   NITRITE NEGATIVE 04/13/2016 0436   LEUKOCYTESUR  NEGATIVE 04/13/2016 0436   Sepsis Labs: @LABRCNTIP (procalcitonin:4,lacticidven:4)  ) Recent Results (from the past 240 hour(s))  Surgical PCR screen     Status: None   Collection Time: 04/13/16  6:31 PM  Result Value Ref Range Status   MRSA, PCR NEGATIVE NEGATIVE Final   Staphylococcus aureus NEGATIVE NEGATIVE Final    Comment:        The Xpert SA Assay (FDA approved for NASAL specimens in patients over 84 years of age), is one component of a comprehensive surveillance program.  Test performance has been validated by [facility] for patients greater than or equal to 80 year old. It is not intended to diagnose infection nor to guide or monitor treatment.          Radiology Studies: Dg C-arm 61-120 Min-no Report  04/15/2016  CLINICAL DATA: femur fracture left C-ARM 61-120 MINUTES Fluoroscopy was utilized by the requesting physician.  No radiographic interpretation.   Dg Femur Min  2 Views Left  04/15/2016  CLINICAL DATA:  Distal left femoral fracture with fixation EXAM: LEFT FEMUR 2 VIEWS COMPARISON:  None. FLUOROSCOPY TIME:  Radiation Exposure Index (as provided by the fluoroscopic device): 14.42 mGy If the device does not provide the exposure index: Fluoroscopy Time:  1 minutes 22 seconds Number of Acquired Images:  3 FINDINGS: A medullary rod is noted with multiple fixation screws distally with reduction of the previously seen distal femoral fracture. IMPRESSION: Status post ORIF of left femoral fracture. Electronically Signed   By: Inez Catalina M.D.   On: 04/15/2016 19:17        Scheduled Meds: . sodium chloride   Intravenous Once  . acetaminophen  500 mg Oral Q6H  .  ceFAZolin (ANCEF) IV  2 g Intravenous Q6H  . DULoxetine  60 mg Oral Daily  . enoxaparin (LOVENOX) injection  40 mg Subcutaneous Q24H  . feeding supplement (ENSURE ENLIVE)  237 mL Oral BID BM  . ferrous sulfate  325 mg Oral TID PC  . folic acid  1 mg Oral Daily  . furosemide  20 mg Intravenous Once  .  mometasone-formoterol  2 puff Inhalation BID  . montelukast  10 mg Oral QHS  . multivitamin with minerals  1 tablet Oral Daily  . nicotine  21 mg Transdermal Daily  . pantoprazole  40 mg Oral BID  . PARoxetine  5 mg Oral Daily  . potassium chloride SA  20 mEq Oral BID WC  . simvastatin  40 mg Oral q morning - 10a  . sucralfate  1 g Oral TID WC & HS  . thiamine  100 mg Oral Daily   Continuous Infusions:     LOS: 3 days    Time spent: 25    Nathanal Hermiz, MD [facility] Pager [phone]  If 7PM-7AM, please contact night-coverage [email or web address] Password [email or web address] 04/16/2016, 10:09 AM

## 2016-04-17 DIAGNOSIS — S72332A Displaced oblique fracture of shaft of left femur, initial encounter for closed fracture: Principal | ICD-10-CM

## 2016-04-17 DIAGNOSIS — F1010 Alcohol abuse, uncomplicated: Secondary | ICD-10-CM

## 2016-04-17 DIAGNOSIS — D62 Acute posthemorrhagic anemia: Secondary | ICD-10-CM

## 2016-04-17 DIAGNOSIS — K7030 Alcoholic cirrhosis of liver without ascites: Secondary | ICD-10-CM

## 2016-04-17 LAB — TYPE AND SCREEN
ABO/RH(D): O POS
ANTIBODY SCREEN: NEGATIVE
UNIT DIVISION: 0
Unit division: 0
Unit division: 0
Unit division: 0

## 2016-04-17 LAB — CBC
HCT: 29.8 % — ABNORMAL LOW (ref 36.0–46.0)
Hemoglobin: 9.5 g/dL — ABNORMAL LOW (ref 12.0–15.0)
MCH: 26.5 pg (ref 26.0–34.0)
MCHC: 31.9 g/dL (ref 30.0–36.0)
MCV: 83.2 fL (ref 78.0–100.0)
PLATELETS: 449 10*3/uL — AB (ref 150–400)
RBC: 3.58 MIL/uL — ABNORMAL LOW (ref 3.87–5.11)
RDW: 18.8 % — AB (ref 11.5–15.5)
WBC: 15.7 10*3/uL — AB (ref 4.0–10.5)

## 2016-04-17 LAB — BASIC METABOLIC PANEL
Anion gap: 8 (ref 5–15)
BUN: 14 mg/dL (ref 6–20)
CHLORIDE: 103 mmol/L (ref 101–111)
CO2: 24 mmol/L (ref 22–32)
CREATININE: 0.43 mg/dL — AB (ref 0.44–1.00)
Calcium: 8.3 mg/dL — ABNORMAL LOW (ref 8.9–10.3)
GFR calc Af Amer: >60 mL/min (ref >60)
GFR calc non Af Amer: >60 mL/min (ref >60)
Glucose, Bld: 117 mg/dL — ABNORMAL HIGH (ref 65–99)
Potassium: 4.7 mmol/L (ref 3.5–5.1)
Sodium: 135 mmol/L (ref 135–145)

## 2016-04-17 MED ORDER — METHOCARBAMOL 1000 MG/10ML IJ SOLN
500.0000 mg | Freq: Four times a day (QID) | INTRAVENOUS | Status: DC | PRN
  Administered 2016-04-17: 500 mg via INTRAVENOUS
  Filled 2016-04-17: qty 550
  Filled 2016-04-17: qty 5

## 2016-04-17 MED ORDER — ENOXAPARIN SODIUM 40 MG/0.4ML ~~LOC~~ SOLN: 40.0000 mg | SUBCUTANEOUS | Status: DC

## 2016-04-17 MED ORDER — OXYCODONE HCL 5 MG PO TABS
15.0000 mg | ORAL_TABLET | ORAL | Status: DC | PRN
  Administered 2016-04-17 (×2): 15 mg via ORAL
  Administered 2016-04-17 – 2016-04-18 (×5): 20 mg via ORAL
  Filled 2016-04-17: qty 3
  Filled 2016-04-17: qty 4
  Filled 2016-04-17: qty 3
  Filled 2016-04-17 (×4): qty 4

## 2016-04-17 MED ORDER — DIPHENHYDRAMINE HCL 25 MG PO CAPS
25.0000 mg | ORAL_CAPSULE | Freq: Four times a day (QID) | ORAL | Status: DC | PRN
  Administered 2016-04-17 – 2016-04-18 (×2): 25 mg via ORAL
  Filled 2016-04-17 (×2): qty 1

## 2016-04-17 MED ORDER — OXYCODONE HCL 10 MG PO TABS: 10.0000 mg | ORAL_TABLET | ORAL | Status: DC | PRN

## 2016-04-17 MED ORDER — POLYETHYLENE GLYCOL 3350 17 G PO PACK
17.0000 g | PACK | Freq: Every day | ORAL | Status: DC
  Administered 2016-04-17 – 2016-04-18 (×2): 17 g via ORAL
  Filled 2016-04-17 (×2): qty 1
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
## 2016-04-17 NOTE — Progress Notes (Signed)
Update: LCSWA met with patient at bedside, explained role and reason for consult. Patient expressed she does not want to go to a SNF at this time as she would like Home Health services.  LCSWA informed Case Physiological scientist.  LCSWA is signing off at this time.   Kathrin Greathouse, Latanya Presser, MSW Clinical Social Worker 5E and Psychiatric Service Line [phone] 04/17/2016  10:57 AM

## 2016-04-17 NOTE — Progress Notes (Signed)
PROGRESS NOTE                                                                                                                                                                                                             Patient Demographics:    [name], is a 70 y.o. female, DOB - [birth year], [id number]  Admit date - 04/13/2016   Admitting Physician Edwin Dada, MD  Outpatient Primary MD for the patient is [name], [place]  LOS - 4  Chief Complaint  Patient presents with  . Leg Pain       Brief Narrative KATLAN LESER is a 70 y.o. female with a past medical history significant for alcohol abuse, cirrhosis, old hx of RCC, asthma, chronic back pain, and recent admission with UGIB with blood loss anemia due to 2 gastric ulcers, discharged 5/31. She presented with hip pain after a fall on 6/3. X-ray shows comminuted left distal fracture.Hb of 7.9 on admission and received 2 units of PRBC. Ortho consulted and had IM nail placed on 6/5. Hb down to 7 after surgery and given 2 units of PRBC on 6/6. PT delayed therapy to today due to questionable DVT in left leg. DVT ruled out with doppler on 6/6.      Subjective:    Kieva Bartelme has left leg pain and swelling.    Assessment  & Plan :  Comminuted fracture to the left distal femur.: Secondary to a mechanical fall, orthopedics consulted,IM nail placed on 6/5. Orthopedics recommending Lovenox for DVT prophylaxis, given recent GI bleed will need close monitoring, however at risk of having DVT. Seen by physical therapy, recommendations are for SNF.  Anemia: Multifactorial from recent acute blood loss anemia due to GI bleeding, perioperative blood loss and from underlying anemia secondary to chronic disease. Has required 2 units of PRBC so far. Follow, transfuse as needed. No overt GI bleeding apparent by history.  Alcohol [id number] alcohol chronically-but claims no EtOH use  since 5/26. No signs of withdrawal. Continue to monitor with CIWA protocol.   Recent UGI Bleed (admission 5/26-5/31): Continue PPI due to 2 gastric ulcers, s/p 4 units PRBC last admission and 2 units this admission..   Asthma:stable, Continue home Advair and albuterol and Singulair  Depression:Continue Cymbalta and Paxil  Smoking: Counseling provided, continue transdermal Nicotine patch  Leukocytosis:reactive  likely, no fevers, monitor clinically  Cirrhosis-suspected to be due to heavy alcoholism:stable, FU with GI.Small varices, protal gastropathy noted on EGD 5/28  Distal L leg swelling:likely due to fracture/surgery and immobilizer.Duplex on 6/6 is negative for DVT  Code Status :  Full  Family Communication  :  No family in room  Disposition Plan  :  SNF tomorrow  Consults  : Orthopedics  Procedures  :  None  DVT Prophylaxis  :  Lovenox started yesterday by ortho  Lab Results  Component Value Date   PLT 449* 04/17/2016    Inpatient Medications  Scheduled Meds: . sodium chloride   Intravenous Once  . DULoxetine  60 mg Oral Daily  . enoxaparin (LOVENOX) injection  40 mg Subcutaneous Q24H  . feeding supplement (ENSURE ENLIVE)  237 mL Oral BID BM  . ferrous sulfate  325 mg Oral TID PC  . folic acid  1 mg Oral Daily  . mometasone-formoterol  2 puff Inhalation BID  . montelukast  10 mg Oral QHS  . multivitamin with minerals  1 tablet Oral Daily  . nicotine  21 mg Transdermal Daily  . pantoprazole  40 mg Oral BID  . PARoxetine  5 mg Oral Daily  . polyethylene glycol  17 g Oral Daily  . potassium chloride SA  20 mEq Oral BID WC  . senna-docusate  1 tablet Oral BID  . simvastatin  40 mg Oral q morning - 10a  . sucralfate  1 g Oral TID WC & HS  . thiamine  100 mg Oral Daily   Continuous Infusions:  PRN Meds:.albuterol, bisacodyl, HYDROmorphone (DILAUDID) injection, methocarbamol (ROBAXIN)  IV, ondansetron **OR** ondansetron (ZOFRAN) IV, oxyCODONE,  zolpidem  Antibiotics  :    Anti-infectives    Start     Dose/Rate Route Frequency Ordered Stop   04/15/16 2300  ceFAZolin (ANCEF) IVPB 2g/100 mL premix     2 g 200 mL/hr over 30 Minutes Intravenous Every 6 hours 04/15/16 2017 04/16/16 1221         Objective:   Filed Vitals:   04/16/16 2110 04/17/16 0419 04/17/16 0848 04/17/16 1351  BP: 114/70 121/66  115/58  Pulse: 101 102  103  Temp: 98.7 F (37.1 C) 98.3 F (36.8 C)  98.8 F (37.1 C)  TempSrc: Oral Oral  Oral  Resp: 18 18  18   Height:      Weight:      SpO2: 100% 100% 98% 100%    Wt Readings from Last 3 Encounters:  04/14/16 63.504 kg (140 lb)  04/05/16 68.04 kg (150 lb)  03/11/16 68.947 kg (152 lb)     Intake/Output Summary (Last 24 hours) at 04/17/16 1402 Last data filed at 04/17/16 1344  Gross per 24 hour  Intake   1625 ml  Output   2025 ml  Net   -400 ml     Physical Exam  Awake Alert, Oriented X 3, No new F.N deficits, Normal affect [place].AT,PERRAL Supple Neck,No JVD, No cervical lymphadenopathy appriciated.  Symmetrical Chest wall movement, Good air movement bilaterally, CTAB RRR,No Gallops,Rubs or new Murmurs, No Parasternal Heave +ve B.Sounds, Abd Soft, No tenderness, No organomegaly appriciated, No rebound - guarding or rigidity. Left leg is in immobilizer with +2 edema. No Cyanosis, Clubbing, No new Rash or bruise      Data Review:    CBC  Recent Labs Lab 04/13/16 0402 04/13/16 1011 04/14/16 0519 04/15/16 0739 04/16/16 0522 04/17/16 0548  WBC 9.8  --  11.2*  15.0* 11.8* 15.7*  HGB 7.9* 7.3* 9.4* 9.6* 7.0* 9.5*  HCT 24.8* 22.8* 28.2* 29.4* 22.3* 29.8*  PLT 431*  --  435* 503* 454* 449*  MCV 88.9  --  85.5 87.2 88.5 83.2  MCH 28.3  --  28.5 28.5 27.8 26.5  MCHC 31.9  --  33.3 32.7 31.4 31.9  RDW 18.5*  --  17.8* 18.1* 18.2* 18.8*  LYMPHSABS 2.7  --   --   --   --   --   MONOABS 1.0  --   --   --   --   --   EOSABS 0.3  --   --   --   --   --   BASOSABS 0.0  --   --   --   --   --      Chemistries   Recent Labs Lab 04/13/16 0402 04/14/16 0519 04/16/16 0522 04/17/16 0548  NA 139 137 134* 135  K 3.5 3.9 4.1 4.7  CL 106 107 104 103  CO2 25 23 22 24   GLUCOSE 122* 126* 238* 117*  BUN 13 11 11 14   CREATININE 0.61 0.43* 0.57 0.43*  CALCIUM 8.3* 8.0* 7.9* 8.3*   ------------------------------------------------------------------------------------------------------------------ No results for input(s): CHOL, HDL, LDLCALC, TRIG, CHOLHDL, LDLDIRECT in the last 72 hours.  Lab Results  Component Value Date   HGBA1C  12/03/2010    5.4 (NOTE)                                                                       According to the ADA Clinical Practice Recommendations for 2011, when HbA1c is used as a screening test:   >=6.5%   Diagnostic of Diabetes Mellitus           (if abnormal result  is confirmed)  5.7-6.4%   Increased risk of developing Diabetes Mellitus  References:Diagnosis and Classification of Diabetes Mellitus,Diabetes Care,2011,34(Suppl 1):S62-S69 and Standards of Medical Care in         Diabetes - 2011,Diabetes [id number]  (Suppl 1):S11-S61.   ------------------------------------------------------------------------------------------------------------------ No results for input(s): TSH, T4TOTAL, T3FREE, THYROIDAB in the last 72 hours.  Invalid input(s): FREET3 ------------------------------------------------------------------------------------------------------------------ No results for input(s): VITAMINB12, FOLATE, FERRITIN, TIBC, IRON, RETICCTPCT in the last 72 hours.  Coagulation profile  Recent Labs Lab 04/13/16 0402  INR 1.09    No results for input(s): DDIMER in the last 72 hours.  Cardiac Enzymes No results for input(s): CKMB, TROPONINI, MYOGLOBIN in the last 168 hours.  Invalid input(s): CK ------------------------------------------------------------------------------------------------------------------ No results found for: BNP  Micro  Results Recent Results (from the past 240 hour(s))  Surgical PCR screen     Status: None   Collection Time: 04/13/16  6:31 PM  Result Value Ref Range Status   MRSA, PCR NEGATIVE NEGATIVE Final   Staphylococcus aureus NEGATIVE NEGATIVE Final    Comment:        The Xpert SA Assay (FDA approved for NASAL specimens in patients over 93 years of age), is one component of a comprehensive surveillance program.  Test performance has been validated by [facility] for patients greater than or equal to 85 year old. It is not intended to diagnose infection nor to guide or monitor treatment.  Radiology Reports Dg Chest 1 View  04/13/2016  CLINICAL DATA:  Status post fall. Preoperative chest radiograph. Initial encounter. EXAM: CHEST 1 VIEW COMPARISON:  Chest radiograph performed 02/28/2014 FINDINGS: The patient was imaged in decubitus position due to limitations in positioning. Pulmonary vascularity is at the upper limits of normal. Minimal scarring is noted at the left lung base. Trace right-sided pleural fluid is suggested. No pneumothorax identified. The cardiomediastinal silhouette is normal in size. No acute osseous abnormalities are identified. IMPRESSION: Suggestion of trace right-sided pleural fluid. Minimal left basilar scarring. Lungs otherwise clear. Electronically Signed   By: Garald Balding M.D.   On: 04/13/2016 04:02   Ct Abdomen Pelvis W Contrast  04/06/2016  CLINICAL DATA:  Acute onset of hematemesis. Decreased hemoglobin. Initial encounter. EXAM: CT ABDOMEN AND PELVIS WITH CONTRAST TECHNIQUE: Multidetector CT imaging of the abdomen and pelvis was performed using the standard protocol following bolus administration of intravenous contrast. CONTRAST:  178mL ISOVUE-300 IOPAMIDOL (ISOVUE-300) INJECTION 61% COMPARISON:  CT of the abdomen and pelvis from 03/05/2016 FINDINGS: The visualized lung bases are clear. The slightly nodular contour of the liver is concerning for mild hepatic  cirrhosis. The spleen is diminutive and grossly unremarkable. The patient is status post cholecystectomy, with clips noted at the gallbladder fossa. The pancreas and adrenal glands are unremarkable. Esophageal varices are seen. There is mild recanalization of the umbilical vein. The kidneys are unremarkable in appearance. There is no evidence of hydronephrosis. No renal or ureteral stones are seen. Mild nonspecific perinephric stranding is noted bilaterally. No free fluid is identified. The small bowel is unremarkable in appearance. The stomach is within normal limits. No acute vascular abnormalities are seen. Calcification is seen at the proximal renal arteries bilaterally, and along the distal abdominal aorta and its branches. The appendix is normal in caliber and contains air, without evidence of appendicitis. The colon is grossly unremarkable in appearance. The bladder is relatively decompressed, likely explaining apparent bladder wall thickening. The patient is status post hysterectomy. No suspicious adnexal masses are seen. No inguinal lymphadenopathy is seen. No acute osseous abnormalities are identified. Prominent endplate irregularity is noted at L5-S1, similar in appearance to the prior study, though mildly worsened from 2015. Multilevel vacuum phenomenon is noted along the lumbar spine, with disc space narrowing. IMPRESSION: 1. No acute abnormality seen to explain the patient's symptoms. 2. Suggestion of mild hepatic cirrhosis, with esophageal varices and mild recanalization of the umbilical vein. 3. Calcification at the proximal renal arteries bilaterally, and along the distal abdominal aorta and its branches. 4. Mildly worsened prominent degenerative change at L5-S1, with multilevel vacuum phenomenon along the lumbar spine. Electronically Signed   By: Garald Balding M.D.   On: 04/06/2016 04:03   Ct Femur Left Wo Contrast  04/13/2016  CLINICAL DATA:  Fall on stairs. Evaluate femur fracture. Initial  encounter. EXAM: CT OF THE LEFT FEMUR WITHOUT CONTRAST TECHNIQUE: Multidetector CT imaging was performed according to the standard protocol. Multiplanar CT image reconstructions were also generated. COMPARISON:  Radiographs same date. FINDINGS: Bones: There is a comminuted, angulated and displaced fracture of the distal femoral diaphysis, extending into the metaphysis. There is up to 2.7 cm of proximal displacement at the fracture proximally. There is approximately 25 degrees of apex lateral angulation. There is also nearly 1 full shaft width of posteromedial displacement as well as some rotation. There are nondisplaced components which extend into the distal femoral metaphysis, involving both femoral epicondyles. No intercondylar extension or involvement of the weight-bearing aspects  of condyles is seen. The proximal femur, including the femoral head and neck are intact. There is no dislocation at the hip or knee. The proximal tibia and proximal fibula appear intact. Joint/cartilage: Small knee joint effusion. Mild degenerative changes are present at the knee with meniscal chondrocalcinosis. No significant findings are seen at the hip. Ligaments: Not applicable for exam/indication. The cruciate ligaments appear intact at the knee. Tendons/muscles: Probable muscular contusion at the level of the comminuted fracture. No large hematoma identified. Neurovascular/other soft tissues: Mild femoral artery atherosclerosis. There is no large tree vascular hematoma. Relatively mild subcutaneous edema is present within the mid to distal thigh. IMPRESSION: 1. Comminuted, angulated and displaced fracture of the distal femoral diaphysis as described. There is extension into the metaphysis with involvement of both femoral epicondyles, but no intercondylar extension or involvement of the weight-bearing articular surface. 2. The proximal femur is intact.  No dislocation. 3. No large hematoma or obvious neurovascular injury on  noncontrast imaging. Electronically Signed   By: Richardean Sale M.D.   On: 04/13/2016 08:51   Dg Abd 2 Views  04/06/2016  CLINICAL DATA:  Acute onset of lower abdominal pain, nausea and vomiting. Blood found in stool, urine and emesis. Initial encounter. EXAM: ABDOMEN - 2 VIEW COMPARISON:  CT of the abdomen and pelvis performed 03/05/2016 FINDINGS: The visualized bowel gas pattern is unremarkable. Scattered air and stool filled loops of colon are seen; no abnormal dilatation of small bowel loops is seen to suggest small bowel obstruction. No free intra-abdominal air is seen on the provided decubitus view. The visualized osseous structures are within normal limits; the sacroiliac joints are unremarkable in appearance. The visualized lung bases are essentially clear. Clips are noted within the right upper quadrant, reflecting prior cholecystectomy. IMPRESSION: Unremarkable bowel gas pattern; no free intra-abdominal air seen. Small amount of stool noted in the colon. Electronically Signed   By: Garald Balding M.D.   On: 04/06/2016 00:25   Dg C-arm 61-120 Min-no Report  04/15/2016  CLINICAL DATA: femur fracture left C-ARM 61-120 MINUTES Fluoroscopy was utilized by the requesting physician.  No radiographic interpretation.   Dg Femur Min 2 Views Left  04/15/2016  CLINICAL DATA:  Distal left femoral fracture with fixation EXAM: LEFT FEMUR 2 VIEWS COMPARISON:  None. FLUOROSCOPY TIME:  Radiation Exposure Index (as provided by the fluoroscopic device): 14.42 mGy If the device does not provide the exposure index: Fluoroscopy Time:  1 minutes 22 seconds Number of Acquired Images:  3 FINDINGS: A medullary rod is noted with multiple fixation screws distally with reduction of the previously seen distal femoral fracture. IMPRESSION: Status post ORIF of left femoral fracture. Electronically Signed   By: Inez Catalina M.D.   On: 04/15/2016 19:17   Dg Femur Min 2 Views Left  04/13/2016  CLINICAL DATA:  Twisted left leg with  fall.  Initial encounter. EXAM: LEFT FEMUR 2 VIEWS COMPARISON:  None. FINDINGS: Comminuted fracturing of the supracondylar femur with dorsal and medial displacement. No visible intercondylar extension, but limited study due to patient positioning. No evidence of knee joint effusion. Severe lower lumbar disc disease with known erosive changes and sclerosis at L5-S1. IMPRESSION: Comminuted and displaced supracondylar femur fracture. No intercondylar component is seen but this is a limited study. Electronically Signed   By: Monte Fantasia M.D.   On: 04/13/2016 04:01    Time Spent in minutes  [place] PA-S on 04/17/2016 at 2:02 PM  Attending MD note  Patient  was seen, examined,treatment plan was discussed with the PA-S.  I have personally reviewed the clinical findings, lab, imaging studies and management of this patient in detail. I agree with the documentation, as recorded by the PA-S.   Mostly complaining of pain at the operative site this morning. No other complaints. No hematochezia or melena evident. Abdomen stable.  On Exam: Gen. exam: Awake, alert, not in any distress Chest: Good air entry bilaterally, no rhonchi or rales CVS: S1-S2 regular, no murmurs Abdomen: Soft, nontender and nondistended Left lower extremity: +edema Neurology: Non-focal Skin: No rash or lesions  Plan: Continue to monitor, mobilize with physical therapy, discontinue Foley catheter-likely will require SNF. Will need very close monitoring-as on prophylactic Lovenox with a recent history of upper GI bleeding   Rest as above  [facility]

## 2016-04-17 NOTE — Progress Notes (Signed)
Subjective: 2 Days Post-Op Procedure(s) (LRB): INTRAMEDULLARY (IM) RETROGRADE FEMORAL NAILING (Left) Patient reports pain as moderate and severe.   Doppler negative yesterday  Objective: Vital signs in last 24 hours: Temp:  [98.3 F (36.8 C)-98.8 F (37.1 C)] 98.3 F (36.8 C) (06/07 0419) Pulse Rate:  [96-110] 102 (06/07 0419) Resp:  [18-20] 18 (06/07 0419) BP: (107-129)/(58-70) 121/66 mmHg (06/07 0419) SpO2:  [97 %-100 %] 100 % (06/07 0419)  Intake/Output from previous day: 06/06 0701 - 06/07 0700 In: 1505 [I.V.:500; Blood:1005] Out: 1520 [Urine:1475; Drains:45] Intake/Output this shift:     Recent Labs  04/15/16 0739 04/16/16 0522 04/17/16 0548  HGB 9.6* 7.0* 9.5*    Recent Labs  04/16/16 0522 04/17/16 0548  WBC 11.8* 15.7*  RBC 2.52* 3.58*  HCT 22.3* 29.8*  PLT 454* 449*    Recent Labs  04/16/16 0522 04/17/16 0548  NA 134* 135  K 4.1 4.7  CL 104 103  CO2 22 24  BUN 11 14  CREATININE 0.57 0.43*  GLUCOSE 238* 117*  CALCIUM 7.9* 8.3*   No results for input(s): LABPT, INR in the last 72 hours.  Neurovascular intact Intact pulses distally No cellulitis present Compartment soft  Assessment/Plan: 2 Days Post-Op Procedure(s) (LRB): INTRAMEDULLARY (IM) RETROGRADE FEMORAL NAILING (Left) Advance diet Up with therapy  Dressing change tomorrow  Gearlean Alf 04/17/2016, 7:55 AM

## 2016-04-17 NOTE — Discharge Instructions (Signed)
Nonweightbearing left leg Lovenox injections daily to prevent blood clots Incision may get wet but do not submerge in water Change your dressing daily. Call if any temperatures greater than 101 or any wound complications: [phone]

## 2016-04-18 DIAGNOSIS — J4530 Mild persistent asthma, uncomplicated: Secondary | ICD-10-CM

## 2016-04-18 LAB — CBC
HCT: 27.5 % — ABNORMAL LOW (ref 36.0–46.0)
HEMOGLOBIN: 8.7 g/dL — AB (ref 12.0–15.0)
MCH: 26.6 pg (ref 26.0–34.0)
MCHC: 31.6 g/dL (ref 30.0–36.0)
MCV: 84.1 fL (ref 78.0–100.0)
PLATELETS: 457 10*3/uL — AB (ref 150–400)
RBC: 3.27 MIL/uL — ABNORMAL LOW (ref 3.87–5.11)
RDW: 19.1 % — ABNORMAL HIGH (ref 11.5–15.5)
WBC: 14.5 10*3/uL — ABNORMAL HIGH (ref 4.0–10.5)

## 2016-04-18 MED ORDER — FERROUS SULFATE 325 (65 FE) MG PO TABS: 325.0000 mg | ORAL_TABLET | Freq: Three times a day (TID) | ORAL | Status: DC

## 2016-04-18 MED ORDER — POTASSIUM CHLORIDE CRYS ER 20 MEQ PO TBCR: 20.0000 meq | EXTENDED_RELEASE_TABLET | Freq: Two times a day (BID) | ORAL | Status: DC

## 2016-04-18 MED ORDER — LORAZEPAM 1 MG PO TABS: 1.0000 mg | ORAL_TABLET | Freq: Two times a day (BID) | ORAL | Status: DC | PRN

## 2016-04-18 MED ORDER — SIMVASTATIN 40 MG PO TABS: 40.0000 mg | ORAL_TABLET | Freq: Every morning | ORAL | Status: DC

## 2016-04-18 MED ORDER — THIAMINE HCL 100 MG PO TABS: 100.0000 mg | ORAL_TABLET | Freq: Every day | ORAL | Status: AC

## 2016-04-18 MED ORDER — OXYCODONE HCL 10 MG PO TABS: 15.0000 mg | ORAL_TABLET | ORAL | Status: DC | PRN

## 2016-04-18 MED ORDER — POLYETHYLENE GLYCOL 3350 17 G PO PACK: 17.0000 g | PACK | Freq: Every day | ORAL | Status: DC

## 2016-04-18 MED ORDER — DULOXETINE HCL 60 MG PO CPEP: 60.0000 mg | ORAL_CAPSULE | Freq: Every day | ORAL | Status: DC

## 2016-04-18 MED ORDER — PANTOPRAZOLE SODIUM 40 MG PO TBEC: 40.0000 mg | DELAYED_RELEASE_TABLET | Freq: Two times a day (BID) | ORAL | Status: DC

## 2016-04-18 MED ORDER — ADULT MULTIVITAMIN W/MINERALS CH: 1.0000 | ORAL_TABLET | Freq: Every day | ORAL | Status: AC

## 2016-04-18 MED ORDER — PAROXETINE HCL 10 MG PO TABS: 5.0000 mg | ORAL_TABLET | Freq: Every day | ORAL | Status: DC

## 2016-04-18 MED ORDER — ZOLPIDEM TARTRATE 5 MG PO TABS
5.0000 mg | ORAL_TABLET | Freq: Every evening | ORAL | Status: DC | PRN
Start: 2016-04-18 — End: 2016-06-14

## 2016-04-18 MED ORDER — FOLIC ACID 1 MG PO TABS: 1.0000 mg | ORAL_TABLET | Freq: Every day | ORAL | Status: AC

## 2016-04-18 MED ORDER — MONTELUKAST SODIUM 10 MG PO TABS: 10.0000 mg | ORAL_TABLET | Freq: Every day | ORAL | Status: DC

## 2016-04-18 MED ORDER — SUCRALFATE 1 G PO TABS: 1.0000 g | ORAL_TABLET | Freq: Three times a day (TID) | ORAL | Status: DC

## 2016-04-18 MED ORDER — ENOXAPARIN SODIUM 40 MG/0.4ML ~~LOC~~ SOLN: 40.0000 mg | SUBCUTANEOUS | Status: DC

## 2016-04-18 MED ORDER — FLUTICASONE-SALMETEROL 500-50 MCG/DOSE IN AEPB: 1.0000 | INHALATION_SPRAY | Freq: Two times a day (BID) | RESPIRATORY_TRACT | Status: DC

## 2016-04-18 NOTE — Progress Notes (Signed)
   Subjective: 3 Days Post-Op Procedure(s) (LRB): INTRAMEDULLARY (IM) RETROGRADE FEMORAL NAILING (Left) Patient reports pain as moderate.   Patient seen in rounds with Dr. Wynelle Link. Patient is well, and has had no acute complaints or problems. No issues overnight.   Objective: Vital signs in last 24 hours: Temp:  [98.8 F (37.1 C)-99.4 F (37.4 C)] 98.9 F (37.2 C) (06/08 0447) Pulse Rate:  [101-112] 101 (06/08 0447) Resp:  [18-20] 18 (06/08 0447) BP: (115-125)/(58-71) 115/71 mmHg (06/08 0447) SpO2:  [97 %-100 %] 99 % (06/08 0447)  Intake/Output from previous day:  Intake/Output Summary (Last 24 hours) at 04/18/16 0736 Last data filed at 04/18/16 0721  Gross per 24 hour  Intake    360 ml  Output    800 ml  Net   -440 ml    Intake/Output this shift: Total I/O In: -  Out: 250 [Urine:250]  Labs:  Recent Labs  04/15/16 0739 04/16/16 0522 04/17/16 0548 04/18/16 0443  HGB 9.6* 7.0* 9.5* 8.7*    Recent Labs  04/17/16 0548 04/18/16 0443  WBC 15.7* 14.5*  RBC 3.58* 3.27*  HCT 29.8* 27.5*  PLT 449* 457*    Recent Labs  04/16/16 0522 04/17/16 0548  NA 134* 135  K 4.1 4.7  CL 104 103  CO2 22 24  BUN 11 14  CREATININE 0.57 0.43*  GLUCOSE 238* 117*  CALCIUM 7.9* 8.3*    EXAM General - Patient is Alert and Oriented Extremity - Neurologically intact Intact pulses distally Dorsiflexion/Plantar flexion intact Compartment soft Dressing/Incision - clean, dry, no drainage Motor Function - intact, moving foot and toes well on exam.   Past Medical History  Diagnosis Date  . Seasonal allergies   . Hypercholesteremia     under control  . GERD (gastroesophageal reflux disease)     occasional  . Arthritis     back-severe, hips, right knee  . H/O mumps   . H/O measles   . Insomnia   . Asthma   . Allergy   . Renal cell carcinoma 2012    left  . Psoriasis (a type of skin inflammation)   . Alcohol abuse   . Alcohol abuse     Assessment/Plan: 3 Days  Post-Op Procedure(s) (LRB): INTRAMEDULLARY (IM) RETROGRADE FEMORAL NAILING (Left) Principal Problem:   Displaced oblique fracture of shaft of left femur (HCC) Active Problems:   Asthma   Chronic back pain   Alcohol abuse   Acute blood loss anemia  Estimated body mass index is 21.29 kg/(m^2) as calculated from the following:   Height as of this encounter: 5\' 8"  (1.727 m).   Weight as of this encounter: 63.504 kg (140 lb). Advance diet  DVT Prophylaxis - Lovenox Nonweightbearing left leg  Discharge when medically stable  Ardeen Jourdain, PA-C Orthopaedic Surgery 04/18/2016, 7:36 AM

## 2016-04-18 NOTE — Clinical Social Work Placement (Signed)
   CLINICAL SOCIAL WORK PLACEMENT  NOTE  Date:  04/18/2016  Patient Details  Name: [name] MRN: [id number] Date of Birth: 05-01-1946  Clinical Social Work is seeking post-discharge placement for this patient at the [place] level of care (*CSW will initial, date and re-position this form in  chart as items are completed):  Yes   Patient/family provided with [place] Work Department's list of facilities offering this level of care within the geographic area requested by the patient (or if unable, by the patient's family).  Yes   Patient/family informed of their freedom to choose among providers that offer the needed level of care, that participate in Medicare, Medicaid or managed care program needed by the patient, have an available bed and are willing to accept the patient.  No   Patient/family informed of [place]'s ownership interest in [facility] and [facility], as well as of the fact that they are under no obligation to receive care at these facilities.  PASRR submitted to EDS on 04/18/16     PASRR number received on 04/18/16     Existing PASRR number confirmed on       FL2 transmitted to all facilities in geographic area requested by pt/family on       FL2 transmitted to all facilities within larger geographic area on 04/18/16     Patient informed that his/her managed care company has contracts with or will negotiate with certain facilities, including the following:  [place]     Yes   Patient/family informed of bed offers received.  Patient chooses bed at [facility]     Physician recommends and patient chooses bed at [facility]    Patient to be transferred to [facility] on 04/18/16.  Patient to be transferred to facility by [facility]     Patient family notified on 04/18/16 of transfer.  Name of family member notified:  Janie     PHYSICIAN        Additional Comment:    _______________________________________________ Lia Hopping, LCSW 04/18/2016, 4:41 PM

## 2016-04-18 NOTE — Progress Notes (Signed)
Occupational Therapy Treatment Patient Details Name: [name] MRN: [id number] DOB: 06/26/1946 Today's Date: 04/18/2016    History of present illness 70 yo female s/p L femur IM nail after fall resulting in displaced oblique fracture of shaft of L femur. Hx of ETOH use, chronic back pain.    OT comments  70 yo female did much better with lateral scoot transfer to drop arm chair.  Still recommend SNF.  Pt is thinking about this  Follow Up Recommendations  SNF HHOT if pt refuses SNF   Equipment Recommendations  3 in 1 bedside comode;Hospital bed;Wheelchair (measurements OT) (drop arm,and trapeze for bed if pt refuses SNF)    Recommendations for Other Services      Precautions / Restrictions Precautions Precautions: Fall Required Braces or Orthoses: Knee Immobilizer - Left Restrictions Weight Bearing Restrictions: Yes LLE Weight Bearing: Non weight bearing       Mobility Bed Mobility Overal bed mobility: Needs Assistance Bed Mobility: Supine to Sit     Supine to sit: Min assist;+2 for safety/equipment     General bed mobility comments: from hospital bed, HOB raised and used trapeze  Transfers Overall transfer level: Needs assistance   Transfers: Lateral/Scoot Transfers          Lateral/Scoot Transfers: +2 safety/equipment;Min assist General transfer comment: Lateral scoot towards R side, bed to drop arm recliner. Multimodal cues for safety, technique. Assist to support L LE for adherence to NWB status. Increased time.     Balance                                   ADL Overall ADL's : Needs assistance/impaired                         Toilet Transfer: Minimal assistance;+2 for safety/equipment (lateral transfer to drop arm recliner)             General ADL Comments: Performed above transfer and explained that we can get her a drop arm 3:1. She has been using a bedpan in the bed.  Pt wants to go home:  continue to recommend rehab for increased  safety and to build up to safe SPTs while maintaining NWB.      Vision                     Perception     Praxis      Cognition   Behavior During Therapy: [facility] for tasks assessed/performed Overall Cognitive Status: Within Functional Limits for tasks assessed Area of Impairment: Problem solving              Problem Solving: Requires tactile cues;Requires verbal cues General Comments: Poor insight into current situation    Extremity/Trunk Assessment               Exercises     Shoulder Instructions       General Comments      Pertinent Vitals/ Pain       Pain Assessment: Faces Faces Pain Scale: Hurts little more Pain Location: L LE with activity Pain Descriptors / Indicators: Sore;Aching;Burning Pain Intervention(s): Limited activity within patient's tolerance;Repositioned;Ice applied  Home Living  Prior Functioning/Environment              Frequency Min 2X/week     Progress Toward Goals  OT Goals(current goals can now be found in the care plan section)  Progress towards OT goals: Progressing toward goals  Acute Rehab OT Goals Patient Stated Goal: decreased pain; return to independence  Plan Discharge plan remains appropriate    Co-evaluation    PT/OT/SLP Co-Evaluation/Treatment: Yes Reason for Co-Treatment: For patient/therapist safety PT goals addressed during session: Mobility/safety with mobility OT goals addressed during session: ADL's and self-care      End of Session     Activity Tolerance Patient tolerated treatment well   Patient Left in chair;with call bell/phone within reach;with chair alarm set   Nurse Communication          Time: [id number] OT Time Calculation (min): 28 min  Charges: OT General Charges $OT Visit: 1 Procedure OT Treatments $Therapeutic Activity: 8-22 mins  Temeca Somma 04/18/2016, 11:50 AM Lesle Chris,  OTR/L [phone] 04/18/2016

## 2016-04-18 NOTE — Progress Notes (Signed)
Date:  April 18, 2016 Chart reviewed for concurrent status and case management needs. Will continue to follow the patient for changes and needs:  Went in to discuss hhc with the patient. We discussed how she will be able to get around at the house with her husbands help. She feels that she is not able to go home at this time with pt only a couple of days a week and now request to go to a snf for short term rehab.  tct-Nicole csw-both she and I spoke with the patient to confirm her wishes. Expected discharge date: [id number] [name], BSN, [place], [place]

## 2016-04-18 NOTE — NC FL2 (Signed)
[place] LEVEL OF CARE SCREENING TOOL     IDENTIFICATION  Patient Name: [name] Birthdate: December 31, 1945 Sex: female Admission Date (Current Location): 04/13/2016  [facility] and [place] Number:  Herbalist and Address:  [facility],  [place] [address] [place]      Provider Number: [id number]  Attending Physician Name and Address:  Jonetta Osgood, MD  Relative Name and Phone Number:       Current Level of Care: Hospital Recommended Level of Care: [place] Prior Approval Number:    Date Approved/Denied:   PASRR Number:    Discharge Plan: SNF    Current Diagnoses: Patient Active Problem List   Diagnosis Date Noted  . Displaced oblique fracture of shaft of left femur ([place]) 04/13/2016  . Acute upper GI bleeding 04/05/2016  . Upper GI bleed 04/05/2016  . Rotator cuff tendinitis 03/11/2016  . Alcohol withdrawal ([place]) 01/04/2016  . Acute blood loss anemia 01/04/2016  . Tobacco abuse 01/03/2016  . Diarrhea 01/03/2016  . Depression 01/03/2016  . Alcohol abuse   . Chronic back pain 08/18/2015  . Seasonal allergies 07/05/2015  . Insomnia 07/05/2015  . Arthritis 07/05/2015  . Psoriasis 07/05/2015  . Menopausal hot flushes 06/01/2015  . Dyspareunia 06/01/2015  . Multiple falls 05/03/2015  . Encounter for medication review 05/03/2015  . Hot flashes 05/03/2015  . Herniated lumbar intervertebral disc 10/12/2014  . Generalized abdominal pain 09/25/2013  . Anemia of chronic disease 09/11/2013  . Asthma 04/24/2007  . Hyperlipidemia 01/04/2007    Orientation RESPIRATION BLADDER Height & Weight     Time, Situation, Place, Self  Normal Continent Weight: 140 lb (63.504 kg) Height:  5\' 8"  (172.7 cm) (per patient )  BEHAVIORAL SYMPTOMS/MOOD NEUROLOGICAL BOWEL NUTRITION STATUS      Continent Diet (Regular)  AMBULATORY STATUS COMMUNICATION OF NEEDS Skin   Limited Assist Verbally Skin abrasions, Other (Comment)  (Incision/Compression Wrap)                       Personal Care Assistance Level of Assistance  Bathing, Feeding, Dressing Bathing Assistance: Limited assistance Feeding assistance: Independent Dressing Assistance: Limited assistance     Functional Limitations Info  Sight, Hearing, Speech Sight Info: Adequate Hearing Info: Adequate Speech Info: Adequate    SPECIAL CARE FACTORS FREQUENCY  PT (By licensed PT)     PT Frequency: 5              Contractures Contractures Info: Not present    Additional Factors Info  Code Status, Allergies, Psychotropic, Insulin Sliding Scale, Isolation Precautions Code Status Info: FullCode Allergies Info: Azithromycin, Morphine And Related Psychotropic Info: None Insulin Sliding Scale Info: None Isolation Precautions Info: None     Current Medications (04/18/2016):  This is the current hospital active medication list Current Facility-Administered Medications  Medication Dose Route Frequency Provider Last Rate Last Dose  . 0.9 %  sodium chloride infusion   Intravenous Once Domenic Polite, MD 10 mL/hr at 04/16/16 1020    . albuterol (PROVENTIL) (2.5 MG/3ML) 0.083% nebulizer solution 3 mL  3 mL Inhalation Q6H PRN Edwin Dada, MD      . bisacodyl (DULCOLAX) suppository 10 mg  10 mg Rectal Daily PRN Edwin Dada, MD      . diphenhydrAMINE (BENADRYL) capsule 25 mg  25 mg Oral Q6H PRN Rhetta Mura Schorr, NP   25 mg at 04/18/16 1035  . DULoxetine (CYMBALTA) DR capsule  60 mg  60 mg Oral Daily Edwin Dada, MD   60 mg at 04/18/16 1036  . enoxaparin (LOVENOX) injection 40 mg  40 mg Subcutaneous Q24H Gaynelle Arabian, MD   40 mg at 04/18/16 1037  . feeding supplement (ENSURE ENLIVE) (ENSURE ENLIVE) liquid 237 mL  237 mL Oral BID BM Edwin Dada, MD   237 mL at 04/18/16 1036  . ferrous sulfate tablet 325 mg  325 mg Oral TID PC Edwin Dada, MD   325 mg at 04/18/16 1211  . folic acid (FOLVITE) tablet 1 mg  1  mg Oral Daily Edwin Dada, MD   1 mg at 04/18/16 1037  . HYDROmorphone (DILAUDID) injection 1-2 mg  1-2 mg Intravenous Q2H PRN Domenic Polite, MD   2 mg at 04/18/16 1211  . methocarbamol (ROBAXIN) 500 mg in dextrose 5 % 50 mL IVPB  500 mg Intravenous Q6H PRN Rhetta Mura Schorr, NP   500 mg at 04/17/16 0037  . mometasone-formoterol (DULERA) 200-5 MCG/ACT inhaler 2 puff  2 puff Inhalation BID Edwin Dada, MD   2 puff at 04/18/16 [phone]  . montelukast (SINGULAIR) tablet 10 mg  10 mg Oral QHS Edwin Dada, MD   10 mg at 04/17/16 2204  . multivitamin with minerals tablet 1 tablet  1 tablet Oral Daily Edwin Dada, MD   1 tablet at 04/18/16 1037  . nicotine (NICODERM CQ - dosed in mg/24 hours) patch 21 mg  21 mg Transdermal Daily Edwin Dada, MD   21 mg at 04/18/16 1037  . ondansetron (ZOFRAN) tablet 4 mg  4 mg Oral Q6H PRN Gaynelle Arabian, MD       Or  . ondansetron [facility]) injection 4 mg  4 mg Intravenous Q6H PRN Gaynelle Arabian, MD      . oxyCODONE (Oxy IR/ROXICODONE) immediate release tablet 15-20 mg  15-20 mg Oral Q3H PRN Jonetta Osgood, MD   20 mg at 04/18/16 1035  . pantoprazole (PROTONIX) EC tablet 40 mg  40 mg Oral BID Edwin Dada, MD   40 mg at 04/18/16 1037  . PARoxetine (PAXIL) tablet 5 mg  5 mg Oral Daily Edwin Dada, MD   5 mg at 04/18/16 1036  . polyethylene glycol (MIRALAX / GLYCOLAX) packet 17 g  17 g Oral Daily Jonetta Osgood, MD   17 g at 04/18/16 1037  . potassium chloride SA (K-DUR,KLOR-CON) CR tablet 20 mEq  20 mEq Oral BID WC Edwin Dada, MD   20 mEq at 04/18/16 0732  . senna-docusate (Senokot-S) tablet 1 tablet  1 tablet Oral BID Domenic Polite, MD   1 tablet at 04/18/16 1036  . simvastatin (ZOCOR) tablet 40 mg  40 mg Oral q morning - 10a Edwin Dada, MD   40 mg at 04/18/16 1036  . sucralfate (CARAFATE) tablet 1 g  1 g Oral TID WC & HS Edwin Dada, MD   1 g at 04/18/16 1211  .  thiamine (VITAMIN B-1) tablet 100 mg  100 mg Oral Daily Edwin Dada, MD   100 mg at 04/18/16 1036  . zolpidem (AMBIEN) tablet 5 mg  5 mg Oral QHS PRN Edwin Dada, MD   5 mg at 04/17/16 2204     Discharge Medications: Please see discharge summary for a list of discharge medications.  Relevant Imaging Results:  Relevant Lab Results:   Additional Information ss# [id number]  Lia Hopping, LCSW

## 2016-04-18 NOTE — Progress Notes (Signed)
Physical Therapy Treatment Patient Details Name: [name] MRN: [id number] DOB: 02-21-1946 Today's Date: 04/18/2016    History of Present Illness 70 yo female s/p L femur IM nail after fall resulting in displaced oblique fracture of shaft of L femur. Hx of ETOH use, chronic back pain.     PT Comments    Worked on lateral scoot transfer today. Min-Mod assist, +2 for safety for mobility. Pt did much better with scoot transfer vs standing. Discussed d/c plan again-recommended ST rehab at SNF to pt. Pt is still undecided.   Follow Up Recommendations  SNF     Equipment Recommendations  Rolling walker with 5" wheels;Wheelchair ;Wheelchair cushion (if pt decides to return home. Will also require ambulance transport.)    Recommendations for Other Services       Precautions / Restrictions Precautions Precautions: Fall Required Braces or Orthoses: Knee Immobilizer - Left Restrictions Weight Bearing Restrictions: Yes LLE Weight Bearing: Non weight bearing    Mobility  Bed Mobility Overal bed mobility: Needs Assistance Bed Mobility: Supine to Sit     Supine to sit: Mod assist;HOB elevated     General bed mobility comments: Assist for L LE and to scoot to EOB. Increased time. Cues for sequence.   Transfers Overall transfer level: Needs assistance   Transfers: Lateral/Scoot Transfers          Lateral/Scoot Transfers: +2 safety/equipment;Min assist General transfer comment: Lateral scoot towards R side, bed to drop arm recliner. Multimodal cues for safety, technique. Assist to support L LE for adherence to NWB status. Increased time.   Ambulation/Gait             General Gait Details: NT-pt unable at this time   Stairs            Wheelchair Mobility    Modified Rankin (Stroke Patients Only)       Balance                                    Cognition Arousal/Alertness: Awake/alert Behavior During Therapy: WFL for tasks  assessed/performed Overall Cognitive Status: No family/caregiver present to determine baseline cognitive functioning Area of Impairment: Problem solving             Problem Solving: Requires tactile cues;Requires verbal cues General Comments: Poor insight into current situation    Exercises      General Comments        Pertinent Vitals/Pain Pain Assessment: Faces Faces Pain Scale: Hurts even more Pain Location: L LE with activity Pain Descriptors / Indicators: Sore;Aching;Burning Pain Intervention(s): Limited activity within patient's tolerance;Repositioned;Ice applied    Home Living                      Prior Function            PT Goals (current goals can now be found in the care plan section) Progress towards PT goals: Progressing toward goals (slowly)    Frequency  Min 3X/week    PT Plan Current plan remains appropriate    Co-evaluation             End of Session Equipment Utilized During Treatment: Left knee immobilizer;Gait belt Activity Tolerance: Patient tolerated treatment well Patient left: in chair;with call bell/phone within reach;with chair alarm set     Time: [id number] PT Time Calculation (min) (ACUTE ONLY): 28 min  Charges:  $Therapeutic Activity:  8-22 mins                    G Codes:      Weston Anna, MPT Pager: [phone]

## 2016-04-18 NOTE — Progress Notes (Signed)
LCSW following acutely with disposition. At this time, patient is reporting she wants to go to [facility] SNF. Barriers to SNF:  Insurance authorization. Patient is stable for discharge today which limits her options of SNF to 4 locations due to lack of insurance authorization.   Will discuss options with patient regarding SNF. If patient declines current options, will have to DC home due to patient being medically stable.     Will continue to follow acutely for disposition and needs.  Lane Hacker, MSW Clinical Social Work: [employer] [phone]

## 2016-04-18 NOTE — Progress Notes (Signed)
Gave report to Carolin Sicks, Therapist, sports at [facility]. Gave number if she had additional questions.

## 2016-04-18 NOTE — Discharge Summary (Addendum)
PATIENT DETAILS Name: [name] Age: 70 y.o. Sex: female Date of Birth: 19-Jul-1946 MRN: [id number]. Admitting Physician: Edwin Dada, MD [id number], Belenda Cruise, FNP  Admit Date: 04/13/2016 Discharge date: 04/18/2016  Recommendations for Outpatient Follow-up:  1. Stop date for prophylactic Lovenox on 04/29/16 2. Repeat CBC/BMET in 1 week 3. Ensure follow up with Orthopedics   PRIMARY DISCHARGE DIAGNOSIS:  Principal Problem:   Displaced oblique fracture of shaft of left femur (HCC) Active Problems:   Asthma   Chronic back pain   Alcohol abuse   Acute blood loss anemia      PAST MEDICAL HISTORY: Past Medical History  Diagnosis Date  . Seasonal allergies   . Hypercholesteremia     under control  . GERD (gastroesophageal reflux disease)     occasional  . Arthritis     back-severe, hips, right knee  . H/O mumps   . H/O measles   . Insomnia   . Asthma   . Allergy   . Renal cell carcinoma 2012    left  . Psoriasis (a type of skin inflammation)   . Alcohol abuse   . Alcohol abuse     DISCHARGE MEDICATIONS: Current Discharge Medication List    START taking these medications   Details  enoxaparin (LOVENOX) 40 MG/0.4ML injection Inject 0.4 mLs (40 mg total) into the skin daily. STOP ON 04/29/16    ferrous sulfate 325 (65 FE) MG tablet Take 1 tablet (325 mg total) by mouth 3 (three) times daily after meals. Refills: 0    oxyCODONE 10 MG TABS Take 1.5-2 tablets (15-20 mg total) by mouth every 3 (three) hours as needed for breakthrough pain. DO NOT GIVE IF SEDATED OR LETHARGIC Qty: 30 tablet, Refills: 0    polyethylene glycol (MIRALAX / GLYCOLAX) packet Take 17 g by mouth daily. Qty: 14 each, Refills: 0      CONTINUE these medications which have CHANGED   Details  LORazepam (ATIVAN) 1 MG tablet Take 1 tablet (1 mg total) by mouth 2 (two) times daily as needed for anxiety (Anxiety, shakiness. DO NOT USE WITH ALCOHOL.). Qty: 20 tablet, Refills: 0    zolpidem  (AMBIEN) 5 MG tablet Take 1 tablet (5 mg total) by mouth at bedtime as needed for sleep. Qty: 10 tablet, Refills: 0      CONTINUE these medications which have NOT CHANGED   Details  albuterol (PROVENTIL HFA;VENTOLIN HFA) 108 (90 Base) MCG/ACT inhaler Inhale 2 puffs into the lungs every 6 (six) hours as needed for wheezing or shortness of breath. Qty: 1 Inhaler, Refills: 4    DULoxetine (CYMBALTA) 60 MG capsule Take 1 capsule (60 mg total) by mouth daily. Qty: 30 capsule, Refills: 2    Fluticasone-Salmeterol (ADVAIR) 500-50 MCG/DOSE AEPB Inhale 1 puff into the lungs 2 (two) times daily. Qty: 60 each, Refills: 0    folic acid (FOLVITE) 1 MG tablet Take 1 tablet (1 mg total) by mouth daily. Qty: 30 tablet, Refills: 0    montelukast (SINGULAIR) 10 MG tablet Take 1 tablet (10 mg total) by mouth at bedtime. Qty: 30 tablet, Refills: 3   Associated Diagnoses: Seasonal allergies    Multiple Vitamin (MULTIVITAMIN WITH MINERALS) TABS tablet Take 1 tablet by mouth daily. Qty: 30 tablet, Refills: 2    nicotine (NICODERM CQ - DOSED IN MG/24 HOURS) 21 mg/24hr patch Place 1 patch (21 mg total) onto the skin daily. Qty: 28 patch, Refills: 0    pantoprazole (PROTONIX) 40 MG tablet Take  1 tablet (40 mg total) by mouth 2 (two) times daily. Qty: 60 tablet, Refills: 0    PARoxetine (PAXIL) 10 MG tablet Take 0.5 tablets (5 mg total) by mouth daily. Qty: 30 tablet, Refills: 2    potassium chloride SA (KLOR-CON M20) 20 MEQ tablet Take 1 tablet (20 mEq total) by mouth 2 (two) times daily with a meal. Qty: 30 tablet, Refills: 2    simvastatin (ZOCOR) 40 MG tablet Take 1 tablet (40 mg total) by mouth every morning. Qty: 30 tablet, Refills: 0    sucralfate (CARAFATE) 1 g tablet Take 1 tablet (1 g total) by mouth 4 (four) times daily -  with meals and at bedtime. Qty: 56 tablet, Refills: 0    thiamine 100 MG tablet Take 1 tablet (100 mg total) by mouth daily. Qty: 30 tablet, Refills: 0      STOP  taking these medications     docusate sodium ([employer] STOOL SOFTENER) 100 MG capsule         ALLERGIES:   Allergies  Allergen Reactions  . Azithromycin Itching and Swelling  . Morphine And Related Itching    BRIEF HPI:  See H&P, Labs, Consult and Test reports for all details in brief, [name] is a 70 y.o. female with a past medical history significant for alcohol abuse, cirrhosis, old hx of RCC, asthma, chronic back pain, and recent admission with UGIB with blood loss anemia due to 2 gastric ulcers, discharged 5/31. She presented with hip pain after a fall on 6/3. X-ray shows comminuted left distal fracture  CONSULTATIONS:   orthopedic surgery  PERTINENT RADIOLOGIC STUDIES: Dg Chest 1 View  04/13/2016  CLINICAL DATA:  Status post fall. Preoperative chest radiograph. Initial encounter. EXAM: CHEST 1 VIEW COMPARISON:  Chest radiograph performed 02/28/2014 FINDINGS: The patient was imaged in decubitus position due to limitations in positioning. Pulmonary vascularity is at the upper limits of normal. Minimal scarring is noted at the left lung base. Trace right-sided pleural fluid is suggested. No pneumothorax identified. The cardiomediastinal silhouette is normal in size. No acute osseous abnormalities are identified. IMPRESSION: Suggestion of trace right-sided pleural fluid. Minimal left basilar scarring. Lungs otherwise clear. Electronically Signed   By: Garald Balding M.D.   On: 04/13/2016 04:02   Ct Abdomen Pelvis W Contrast  04/06/2016  CLINICAL DATA:  Acute onset of hematemesis. Decreased hemoglobin. Initial encounter. EXAM: CT ABDOMEN AND PELVIS WITH CONTRAST TECHNIQUE: Multidetector CT imaging of the abdomen and pelvis was performed using the standard protocol following bolus administration of intravenous contrast. CONTRAST:  166mL ISOVUE-300 IOPAMIDOL (ISOVUE-300) INJECTION 61% COMPARISON:  CT of the abdomen and pelvis from 03/05/2016 FINDINGS: The visualized lung bases are clear. The  slightly nodular contour of the liver is concerning for mild hepatic cirrhosis. The spleen is diminutive and grossly unremarkable. The patient is status post cholecystectomy, with clips noted at the gallbladder fossa. The pancreas and adrenal glands are unremarkable. Esophageal varices are seen. There is mild recanalization of the umbilical vein. The kidneys are unremarkable in appearance. There is no evidence of hydronephrosis. No renal or ureteral stones are seen. Mild nonspecific perinephric stranding is noted bilaterally. No free fluid is identified. The small bowel is unremarkable in appearance. The stomach is within normal limits. No acute vascular abnormalities are seen. Calcification is seen at the proximal renal arteries bilaterally, and along the distal abdominal aorta and its branches. The appendix is normal in caliber and contains air, without evidence of appendicitis. The colon is  grossly unremarkable in appearance. The bladder is relatively decompressed, likely explaining apparent bladder wall thickening. The patient is status post hysterectomy. No suspicious adnexal masses are seen. No inguinal lymphadenopathy is seen. No acute osseous abnormalities are identified. Prominent endplate irregularity is noted at L5-S1, similar in appearance to the prior study, though mildly worsened from 2015. Multilevel vacuum phenomenon is noted along the lumbar spine, with disc space narrowing. IMPRESSION: 1. No acute abnormality seen to explain the patient's symptoms. 2. Suggestion of mild hepatic cirrhosis, with esophageal varices and mild recanalization of the umbilical vein. 3. Calcification at the proximal renal arteries bilaterally, and along the distal abdominal aorta and its branches. 4. Mildly worsened prominent degenerative change at L5-S1, with multilevel vacuum phenomenon along the lumbar spine. Electronically Signed   By: Garald Balding M.D.   On: 04/06/2016 04:03   Ct Femur Left Wo Contrast  04/13/2016   CLINICAL DATA:  Fall on stairs. Evaluate femur fracture. Initial encounter. EXAM: CT OF THE LEFT FEMUR WITHOUT CONTRAST TECHNIQUE: Multidetector CT imaging was performed according to the standard protocol. Multiplanar CT image reconstructions were also generated. COMPARISON:  Radiographs same date. FINDINGS: Bones: There is a comminuted, angulated and displaced fracture of the distal femoral diaphysis, extending into the metaphysis. There is up to 2.7 cm of proximal displacement at the fracture proximally. There is approximately 25 degrees of apex lateral angulation. There is also nearly 1 full shaft width of posteromedial displacement as well as some rotation. There are nondisplaced components which extend into the distal femoral metaphysis, involving both femoral epicondyles. No intercondylar extension or involvement of the weight-bearing aspects of condyles is seen. The proximal femur, including the femoral head and neck are intact. There is no dislocation at the hip or knee. The proximal tibia and proximal fibula appear intact. Joint/cartilage: Small knee joint effusion. Mild degenerative changes are present at the knee with meniscal chondrocalcinosis. No significant findings are seen at the hip. Ligaments: Not applicable for exam/indication. The cruciate ligaments appear intact at the knee. Tendons/muscles: Probable muscular contusion at the level of the comminuted fracture. No large hematoma identified. Neurovascular/other soft tissues: Mild femoral artery atherosclerosis. There is no large tree vascular hematoma. Relatively mild subcutaneous edema is present within the mid to distal thigh. IMPRESSION: 1. Comminuted, angulated and displaced fracture of the distal femoral diaphysis as described. There is extension into the metaphysis with involvement of both femoral epicondyles, but no intercondylar extension or involvement of the weight-bearing articular surface. 2. The proximal femur is intact.  No  dislocation. 3. No large hematoma or obvious neurovascular injury on noncontrast imaging. Electronically Signed   By: Richardean Sale M.D.   On: 04/13/2016 08:51   Dg Abd 2 Views  04/06/2016  CLINICAL DATA:  Acute onset of lower abdominal pain, nausea and vomiting. Blood found in stool, urine and emesis. Initial encounter. EXAM: ABDOMEN - 2 VIEW COMPARISON:  CT of the abdomen and pelvis performed 03/05/2016 FINDINGS: The visualized bowel gas pattern is unremarkable. Scattered air and stool filled loops of colon are seen; no abnormal dilatation of small bowel loops is seen to suggest small bowel obstruction. No free intra-abdominal air is seen on the provided decubitus view. The visualized osseous structures are within normal limits; the sacroiliac joints are unremarkable in appearance. The visualized lung bases are essentially clear. Clips are noted within the right upper quadrant, reflecting prior cholecystectomy. IMPRESSION: Unremarkable bowel gas pattern; no free intra-abdominal air seen. Small amount of stool noted in the colon. Electronically  Signed   By: Garald Balding M.D.   On: 04/06/2016 00:25   Dg C-arm 61-120 Min-no Report  04/15/2016  CLINICAL DATA: femur fracture left C-ARM 61-120 MINUTES Fluoroscopy was utilized by the requesting physician.  No radiographic interpretation.   Dg Femur Min 2 Views Left  04/15/2016  CLINICAL DATA:  Distal left femoral fracture with fixation EXAM: LEFT FEMUR 2 VIEWS COMPARISON:  None. FLUOROSCOPY TIME:  Radiation Exposure Index (as provided by the fluoroscopic device): 14.42 mGy If the device does not provide the exposure index: Fluoroscopy Time:  1 minutes 22 seconds Number of Acquired Images:  3 FINDINGS: A medullary rod is noted with multiple fixation screws distally with reduction of the previously seen distal femoral fracture. IMPRESSION: Status post ORIF of left femoral fracture. Electronically Signed   By: Inez Catalina M.D.   On: 04/15/2016 19:17   Dg Femur  Min 2 Views Left  04/13/2016  CLINICAL DATA:  Twisted left leg with fall.  Initial encounter. EXAM: LEFT FEMUR 2 VIEWS COMPARISON:  None. FINDINGS: Comminuted fracturing of the supracondylar femur with dorsal and medial displacement. No visible intercondylar extension, but limited study due to patient positioning. No evidence of knee joint effusion. Severe lower lumbar disc disease with known erosive changes and sclerosis at L5-S1. IMPRESSION: Comminuted and displaced supracondylar femur fracture. No intercondylar component is seen but this is a limited study. Electronically Signed   By: Monte Fantasia M.D.   On: 04/13/2016 04:01     PERTINENT LAB RESULTS: CBC:  Recent Labs  04/17/16 0548 04/18/16 0443  WBC 15.7* 14.5*  HGB 9.5* 8.7*  HCT 29.8* 27.5*  PLT 449* 457*   CMET CMP     Component Value Date/Time   NA 135 04/17/2016 0548   K 4.7 04/17/2016 0548   CL 103 04/17/2016 0548   CO2 24 04/17/2016 0548   GLUCOSE 117* 04/17/2016 0548   BUN 14 04/17/2016 0548   CREATININE 0.43* 04/17/2016 0548   CALCIUM 8.3* 04/17/2016 0548   PROT 5.7* 04/08/2016 0311   ALBUMIN 2.7* 04/08/2016 0311   AST 34 04/08/2016 0311   ALT 20 04/08/2016 0311   ALKPHOS 135* 04/08/2016 0311   BILITOT 0.4 04/08/2016 0311   GFRNONAA >60 04/17/2016 0548   GFRAA >60 04/17/2016 0548    GFR Estimated Creatinine Clearance: 68.4 mL/min (by C-G formula based on Cr of 0.43). No results for input(s): LIPASE, AMYLASE in the last 72 hours. No results for input(s): CKTOTAL, CKMB, CKMBINDEX, TROPONINI in the last 72 hours. Invalid input(s): POCBNP No results for input(s): DDIMER in the last 72 hours. No results for input(s): HGBA1C in the last 72 hours. No results for input(s): CHOL, HDL, LDLCALC, TRIG, CHOLHDL, LDLDIRECT in the last 72 hours. No results for input(s): TSH, T4TOTAL, T3FREE, THYROIDAB in the last 72 hours.  Invalid input(s): FREET3 No results for input(s): VITAMINB12, FOLATE, FERRITIN, TIBC, IRON,  RETICCTPCT in the last 72 hours. Coags: No results for input(s): INR in the last 72 hours.  Invalid input(s): PT Microbiology: Recent Results (from the past 240 hour(s))  Surgical PCR screen     Status: None   Collection Time: 04/13/16  6:31 PM  Result Value Ref Range Status   MRSA, PCR NEGATIVE NEGATIVE Final   Staphylococcus aureus NEGATIVE NEGATIVE Final    Comment:        The Xpert SA Assay (FDA approved for NASAL specimens in patients over 90 years of age), is one component of a comprehensive surveillance program.  Test performance has been validated by [facility] for patients greater than or equal to 11 year old. It is not intended to diagnose infection nor to guide or monitor treatment.      BRIEF HOSPITAL COURSE:  Comminuted fracture to the left distal femur.: Secondary to a mechanical fall, orthopedics consulted,IM nail placed on 6/5. Orthopedics recommending Lovenox for DVT prophylaxis, given recent GI bleed will need close monitoring, however at risk of having DVT. Spoke with Dr Wynelle Link 6/8-he feels that patient is going to need pharmacologic prophylaxis for atleast 2 weeks as patient is high risk for developing VTE-given recent gastric ulcers I suspect it is safer to be on prophylactic Lovenox than Aspirin.I spoke to patient today at bedside regarding risk vs benefits, she at this is agreeable to prophylactic lovenox. She is being discharged to a SNF, where she can be monitored closely. She has been advised to let nursing staff know if she develops melena.hematochezia or hemetemesis. Seen by physical therapy, recommendations are for SNF.  Anemia: Multifactorial from recent acute blood loss anemia due to recent GI bleeding, perioperative blood loss and from underlying anemia secondary to chronic disease. Has required 2 units of PRBC so far. No overt GI bleeding apparent by history.  Alcohol [id number] alcohol chronically-but claims no EtOH use since 5/26. No signs of  withdrawal. Managed with [facility] protocol.   Recent UGI Bleed (admission 5/26-5/31): Continue PPI due to 2 gastric ulcers, s/p 4 units PRBC last admission and 2 units this admission.Hb stable at 8.7 on discharge.  Asthma:stable, Continue home Advair and albuterol and Singulair  Depression:Continue Cymbalta and Paxil  Smoking: Counseling provided, continue transdermal Nicotine patch  Leukocytosis:reactive likely, no fevers, monitor clinically-no indication for antibiotics at this time.  Cirrhosis-suspected to be due to heavy alcoholism:stable, Follow with GI.Small varices, protal gastropathy noted on EGD 5/28  Distal L leg swelling:likely due to fracture/surgery and immobilizer.Duplex on 6/6 is negative for DVT  TODAY-DAY OF DISCHARGE:  Subjective:   [name] today has no headache,no chest abdominal pain,no new weakness tingling or numbness, feels much better wants to go home today.   Objective:   Blood pressure 115/71, pulse 101, temperature 98.9 F (37.2 C), temperature source Oral, resp. rate 18, height 5\' 8"  (1.727 m), weight 63.504 kg (140 lb), SpO2 97 %.  Intake/Output Summary (Last 24 hours) at 04/18/16 0955 Last data filed at 04/18/16 0721  Gross per 24 hour  Intake    240 ml  Output    800 ml  Net   -560 ml   Filed Weights   04/14/16 1621  Weight: 63.504 kg (140 lb)    Exam Awake Alert, Oriented *3, No new F.N deficits, Normal affect [place].AT,PERRAL Supple Neck,No JVD, No cervical lymphadenopathy appriciated.  Symmetrical Chest wall movement, Good air movement bilaterally, CTAB RRR,No Gallops,Rubs or new Murmurs, No Parasternal Heave +ve B.Sounds, Abd Soft, Non tender, No organomegaly appriciated, No rebound -guarding or rigidity. No Cyanosis, Clubbing or edema, No new Rash or bruise  DISCHARGE CONDITION: Stable  DISPOSITION: SNF  DISCHARGE INSTRUCTIONS:    Activity:  Non weight bearing to left leg As tolerated with Full fall precautions use walker/cane &  assistance as needed  Get Medicines reviewed and adjusted: Please take all your medications with you for your next visit with your Primary MD  Please request your Primary MD to go over all hospital tests and procedure/radiological results at the follow up, please ask your Primary MD to get all Hospital records sent to  his/her office.  If you experience worsening of your admission symptoms, develop shortness of breath, life threatening emergency, suicidal or homicidal thoughts you must seek medical attention immediately by calling 911 or calling your MD immediately  if symptoms less severe.  You must read complete instructions/literature along with all the possible adverse reactions/side effects for all the Medicines you take and that have been prescribed to you. Take any new Medicines after you have completely understood and accpet all the possible adverse reactions/side effects.   Do not drive when taking Pain medications.   Do not take more than prescribed Pain, Sleep and Anxiety Medications  Special Instructions: If you have smoked or chewed Tobacco  in the last 2 yrs please stop smoking, stop any regular Alcohol  and or any Recreational drug use.  Wear Seat belts while driving.  Please note  You were cared for by a hospitalist during your hospital stay. Once you are discharged, your primary care physician will handle any further medical issues. Please note that NO REFILLS for any discharge medications will be authorized once you are discharged, as it is imperative that you return to your primary care physician (or establish a relationship with a primary care physician if you do not have one) for your aftercare needs so that they can reassess your need for medications and monitor your lab values.   Diet recommendation: Heart Healthy diet  Discharge Instructions    Call MD for:  redness, tenderness, or signs of infection (pain, swelling, redness, odor or green/yellow discharge around  incision site)    Complete by:  As directed      Diet - low sodium heart healthy    Complete by:  As directed      Non weight bearing    Complete by:  As directed   Laterality:  left  Extremity:  Lower     Other Restrictions    Complete by:  As directed   Nonweightbearing left leg     Walk with assistance    Complete by:  As directed   Nonweightbearing left leg           Follow-up Information    Follow up with Gearlean Alf, MD On 04/30/2016.   Specialty:  Orthopedic Surgery   Why:  Hospital follow up   Contact information:   [address] [place] [address] [phone]       Follow up with [name], [place]. Schedule an appointment as soon as possible for a visit in 2 weeks.   Specialty:  Family Medicine   Why:  Hospital follow up   Contact information:   [address] [place] [address] [phone]      Total Time spent on discharge equals  45 minutes.  SignedOren Binet 04/18/2016 9:55 AM

## 2016-04-19 NOTE — Anesthesia Postprocedure Evaluation (Signed)
Anesthesia Post Note  Patient: [name]  Procedure(s) Performed: Procedure(s) (LRB): INTRAMEDULLARY (IM) RETROGRADE FEMORAL NAILING (Left)  Patient location during evaluation: PACU Anesthesia Type: General Level of consciousness: awake and alert Pain management: pain level controlled Vital Signs Assessment: post-procedure vital signs reviewed and stable Respiratory status: spontaneous breathing, nonlabored ventilation, respiratory function stable and patient connected to nasal cannula oxygen Cardiovascular status: blood pressure returned to baseline and stable Postop Assessment: no signs of nausea or vomiting Anesthetic complications: no    Last Vitals:  Filed Vitals:   04/18/16 0447 04/18/16 1454  BP: 115/71 135/73  Pulse: 101 99  Temp: 37.2 C 37.1 C  Resp: 18 18    Last Pain:  Filed Vitals:   04/18/16 1848  PainSc: 2                  Tiajuana Amass

## 2016-04-19 NOTE — Progress Notes (Signed)
Disposition:LCSWA met with client at bedside. Informed patient of disposition.  LCSWA contacted patient husband. LCSWA contacted facility, [facility] faxed Admission paperwork over to get patient to sign for due to husband not being able to get over to facility. [facility] called for patient transport.   Kathrin Greathouse, Latanya Presser, MSW Clinical Social Worker 5E and Psychiatric Service Line [phone] 04/19/2016  8:24 AM

## 2016-04-28 ENCOUNTER — Emergency Department (HOSPITAL_BASED_OUTPATIENT_CLINIC_OR_DEPARTMENT_OTHER)
Admit: 2016-04-28 | Discharge: 2016-04-28 | Disposition: A | Discharge status: Home / Self Care | Payer: Medicare HMO | Attending: Emergency Medicine | Admitting: Emergency Medicine

## 2016-04-28 ENCOUNTER — Emergency Department (HOSPITAL_COMMUNITY)
Admission: EM | Admit: 2016-04-28 | Discharge: 2016-04-28 | Disposition: A | Discharge status: Home / Self Care | Payer: Medicare HMO | Attending: Emergency Medicine | Admitting: Emergency Medicine

## 2016-04-28 ENCOUNTER — Encounter (HOSPITAL_COMMUNITY): Payer: Self-pay

## 2016-04-28 DIAGNOSIS — M7989 Other specified soft tissue disorders: Secondary | ICD-10-CM | POA: Insufficient documentation

## 2016-04-28 DIAGNOSIS — Z79899 Other long term (current) drug therapy: Secondary | ICD-10-CM | POA: Insufficient documentation

## 2016-04-28 DIAGNOSIS — F17210 Nicotine dependence, cigarettes, uncomplicated: Secondary | ICD-10-CM | POA: Insufficient documentation

## 2016-04-28 DIAGNOSIS — J45909 Unspecified asthma, uncomplicated: Secondary | ICD-10-CM | POA: Diagnosis not present

## 2016-04-28 MED ORDER — HYDROCODONE-ACETAMINOPHEN 5-325 MG PO TABS
1.0000 | ORAL_TABLET | Freq: Once | ORAL | Status: AC
  Administered 2016-04-28: 1 via ORAL
  Filled 2016-04-28: qty 1

## 2016-04-28 MED ORDER — OXYCODONE-ACETAMINOPHEN 5-325 MG PO TABS
1.0000 | ORAL_TABLET | Freq: Once | ORAL | Status: AC
  Administered 2016-04-28: 1 via ORAL
  Filled 2016-04-28: qty 1

## 2016-04-28 NOTE — ED Notes (Signed)
BIB [facility] from [facility], pt c/o 8/10 left leg pain and swelling. Pt had a total knee replacement complete here at [facility] last week. [facility] Staff concerned of DVT. Pt has no hx of DVT. Pt on daily Lovenox subQ. Pt arrives A+OX4, speaking in complete sentences.

## 2016-04-28 NOTE — ED Notes (Signed)
Bed: WA07 Expected date: 04/28/16 Expected time: 12:38 PM Means of arrival: Ambulance Comments: Post of Knee surgery, ? cellulitius

## 2016-04-28 NOTE — ED Provider Notes (Signed)
CSN: [id number]     Arrival date & time 04/28/16  1231 History   First MD Initiated Contact with Patient 04/28/16 1319     Chief Complaint  Patient presents with  . Leg Swelling    Left Leg  . Post-op Problem    Total Knee Replacment done last week     (Consider location/radiation/quality/duration/timing/severity/associated sxs/prior Treatment) HPI [name] is a 70 y.o. female here from [facility] living facility for evaluation of left leg swelling. Patient had knee surgery on 6/5-IM retrograde femoral nailing. She reports over the past 1-2 days she has had increasing left leg swelling with associated pain that she characterizes as "a soreness". She denies any other medical problems or medications. Per nursing, patient is on subcutaneous Lovenox daily at her facility. Patient denies any fevers, chills, cough or hemoptysis, chest pain or shortness of breath. No numbness, weakness. Palpation and certain movements worsen discomfort. She reports she has a follow-up plan with her orthopedist on Tuesday. No other modifying factors.  Past Medical History  Diagnosis Date  . Seasonal allergies   . Hypercholesteremia     under control  . GERD (gastroesophageal reflux disease)     occasional  . Arthritis     back-severe, hips, right knee  . H/O mumps   . H/O measles   . Insomnia   . Asthma   . Allergy   . Renal cell carcinoma 2012    left  . Psoriasis (a type of skin inflammation)   . Alcohol abuse   . Alcohol abuse    Past Surgical History  Procedure Laterality Date  . Kidney surgery  12/2010    [facility]; partial nephrectomy  . Hernia repair  [id number]    supraumbilical repair  . Bunionectomy  04/2011  . Menisectomy  2010    left knee  . Cholecystectomy  11/13/2011    Procedure: LAPAROSCOPIC CHOLECYSTECTOMY WITH INTRAOPERATIVE CHOLANGIOGRAM;  Surgeon: Judieth Keens, DO;  [facility]: [facility];  Service: General;  Laterality: N/A;  . Esophagogastroduodenoscopy N/A 09/25/2013   Procedure: ESOPHAGOGASTRODUODENOSCOPY (EGD);  Surgeon: Lear Ng, MD;  Location: [facility];  Service: Endoscopy;  Laterality: N/A;  . Colonoscopy N/A 09/25/2013    Procedure: COLONOSCOPY;  Surgeon: Lear Ng, MD;  [facility];  Service: Endoscopy;  Laterality: N/A;  . Abdominal hysterectomy  40years ago  . Tubal ligation  44 years ago  . Lumbar laminectomy/decompression microdiscectomy Right 10/12/2014    Procedure: HEMI LAMINECTOMY MICRODISCECTOMY L5-S1 RIGHT (1 LEVEL);  Surgeon: Tobi Bastos, MD;  [facility];  Service: Orthopedics;  Laterality: Right;  . Esophagogastroduodenoscopy (egd) with propofol Left 01/03/2016    Procedure: ESOPHAGOGASTRODUODENOSCOPY (EGD) WITH PROPOFOL;  Surgeon: Arta Silence, MD;  [facility];  Service: Endoscopy;  Laterality: Left;  . Esophagogastroduodenoscopy N/A 04/07/2016    Procedure: ESOPHAGOGASTRODUODENOSCOPY (EGD);  Surgeon: Milus Banister, MD;  Location: [facility];  Service: Endoscopy;  Laterality: N/A;  . Femur im nail Left 04/15/2016    Procedure: INTRAMEDULLARY (IM) RETROGRADE FEMORAL NAILING;  Surgeon: Gaynelle Arabian, MD;  [facility];  Service: Orthopedics;  Laterality: Left;   Family History  Problem Relation Age of Onset  . Hyperlipidemia Mother   . Hypertension Mother    Social History  Substance Use Topics  . Smoking status: Current Some Day Smoker -- 0.15 packs/day for 6 years    Types: Cigarettes  . Smokeless tobacco: Never Used  . Alcohol Use: 1.2 oz/week    0 Standard drinks  or equivalent, 1 Shots of liquor, 1 Glasses of wine per week     Comment: socially last drink 04/10/16    OB History    Gravida Para Term Preterm AB TAB SAB Ectopic Multiple Living   3 3        3      Review of Systems A 10 point review of systems was completed and was negative except for pertinent positives and negatives as mentioned in the history of present illness     Allergies  Azithromycin and  Morphine and related  Home Medications   Prior to Admission medications   Medication Sig Start Date End Date Taking? Authorizing Provider  albuterol (PROVENTIL HFA;VENTOLIN HFA) 108 (90 Base) MCG/ACT inhaler Inhale 2 puffs into the lungs every 6 (six) hours as needed for wheezing or shortness of breath. 01/19/16  Yes Golden Circle, FNP  diphenhydrAMINE (BENADRYL) 25 MG tablet Take 25 mg by mouth every 6 (six) hours as needed for itching.   Yes Historical Provider, MD  DULoxetine (CYMBALTA) 60 MG capsule Take 1 capsule (60 mg total) by mouth daily. 04/18/16  Yes Shanker Kristeen Mans, MD  enoxaparin (LOVENOX) 40 MG/0.4ML injection Inject 0.4 mLs (40 mg total) into the skin daily. STOP ON 04/29/16 04/18/16  Yes Shanker Kristeen Mans, MD  ferrous sulfate 325 (65 FE) MG tablet Take 1 tablet (325 mg total) by mouth 3 (three) times daily after meals. 04/18/16  Yes Shanker Kristeen Mans, MD  Fluticasone-Salmeterol (ADVAIR) 500-50 MCG/DOSE AEPB Inhale 1 puff into the lungs 2 (two) times daily. 04/18/16  Yes Shanker Kristeen Mans, MD  folic acid (FOLVITE) 1 MG tablet Take 1 tablet (1 mg total) by mouth daily. 04/18/16  Yes Shanker Kristeen Mans, MD  LORazepam (ATIVAN) 1 MG tablet Take 1 tablet (1 mg total) by mouth 2 (two) times daily as needed for anxiety (Anxiety, shakiness. DO NOT USE WITH ALCOHOL.). 04/18/16  Yes Shanker Kristeen Mans, MD  methocarbamol (ROBAXIN) 500 MG tablet Take 500 mg by mouth every 6 (six) hours as needed for muscle spasms.   Yes Historical Provider, MD  montelukast (SINGULAIR) 10 MG tablet Take 1 tablet (10 mg total) by mouth at bedtime. 04/18/16  Yes Shanker Kristeen Mans, MD  Multiple Vitamin (MULTIVITAMIN WITH MINERALS) TABS tablet Take 1 tablet by mouth daily. 04/18/16  Yes Shanker Kristeen Mans, MD  oxyCODONE (ROXICODONE) 15 MG immediate release tablet Take 15 mg by mouth every 3 (three) hours as needed for pain.   Yes Historical Provider, MD  pantoprazole (PROTONIX) 40 MG tablet Take 1 tablet (40 mg total) by mouth 2  (two) times daily. 04/18/16  Yes Shanker Kristeen Mans, MD  PARoxetine (PAXIL) 10 MG tablet Take 0.5 tablets (5 mg total) by mouth daily. 04/18/16  Yes Shanker Kristeen Mans, MD  polyethylene glycol (MIRALAX / GLYCOLAX) packet Take 17 g by mouth daily. 04/18/16  Yes Shanker Kristeen Mans, MD  simvastatin (ZOCOR) 40 MG tablet Take 1 tablet (40 mg total) by mouth every morning. 04/18/16  Yes Shanker Kristeen Mans, MD  sucralfate (CARAFATE) 1 g tablet Take 1 tablet (1 g total) by mouth 4 (four) times daily -  with meals and at bedtime. 04/18/16  Yes Shanker Kristeen Mans, MD  thiamine 100 MG tablet Take 1 tablet (100 mg total) by mouth daily. 04/18/16  Yes Shanker Kristeen Mans, MD  zolpidem (AMBIEN) 5 MG tablet Take 1 tablet (5 mg total) by mouth at bedtime as needed for sleep. 04/18/16  Yes Shanker Jerilynn Mages  Ghimire, MD   BP 151/76 mmHg  Pulse 90  Temp(Src) 98.9 F (37.2 C) (Oral)  Resp 18  SpO2 99% Physical Exam  Constitutional: She is oriented to person, place, and time. She appears well-developed and well-nourished.  Overall well-appearing African-American female  HENT:  Head: Normocephalic and atraumatic.  Mouth/Throat: Oropharynx is clear and moist.  Eyes: Conjunctivae are normal. Pupils are equal, round, and reactive to light. Right eye exhibits no discharge. Left eye exhibits no discharge. No scleral icterus.  Neck: Neck supple.  Cardiovascular: Normal rate, regular rhythm and normal heart sounds.   Pulmonary/Chest: Effort normal and breath sounds normal. No respiratory distress. She has no wheezes. She has no rales.  Abdominal: Soft. There is no tenderness.  Musculoskeletal:  Compared to right leg, left leg is grossly swollen, more/mild warmth, no obvious erythema. Surgical wounds are covered in surgical dressings, but there is no evidence of surrounding cellulitis. Distal pulses are intact with brisk cap refill. Sensation intact to light touch.  Neurological: She is alert and oriented to person, place, and time.  Cranial  Nerves II-XII grossly intact  Skin: Skin is warm and dry. No rash noted.  Psychiatric: She has a normal mood and affect.  Nursing note and vitals reviewed.   ED Course  Procedures (including critical care time) Labs Review Labs Reviewed - No data to display  Imaging Review No results found. I have personally reviewed and evaluated these images and lab results as part of my medical decision-making.   EKG Interpretation None      MDM  Patient with recent knee surgery in early June here for bilateral left leg swelling. She is from living facility and is reportedly taking Lovenox subcutaneous daily. Wound appears to be healing well, surgical dressings were not removed, but there is no surrounding erythema or sign of cellulitis. Ultrasound was negative for DVT. No evidence of infection. Patient is afebrile, hemodynamically stable. Encouraged to keep her follow-up appointment with her orthopedist next Tuesday for reevaluation. Appears well, nontoxic and appropriate for discharge. Prior to patient discharge, I discussed and reviewed this case with Dr.Pickering, who also saw the patient.  Final diagnoses:  Left leg swelling        Comer Locket, PA-C 04/28/16 1627  Davonna Belling, MD 04/28/16 [phone]

## 2016-04-28 NOTE — Progress Notes (Addendum)
*  PRELIMINARY RESULTS* Vascular Ultrasound Left lower extremity venous duplex has been completed.  Preliminary findings: No evidence of DVT or baker's cyst. Unchanged from study on 04/16/16   Landry Mellow, [place], RVT  04/28/2016, 3:12 PM

## 2016-04-28 NOTE — Discharge Instructions (Signed)
There does not appear to be an emergent cause for your leg swelling this time. There is no evidence of blood clot on your ultrasound. Please follow-up with your orthopedist for regularly scheduled appointment on Tuesday. Return to ED for any new or worsening symptoms as we discussed.

## 2016-05-28 ENCOUNTER — Telehealth: Payer: Self-pay | Admitting: Emergency Medicine

## 2016-05-28 NOTE — Telephone Encounter (Signed)
Ok to work with HR less than 120 as some increased heart rate is expected with pain.

## 2016-05-28 NOTE — Telephone Encounter (Signed)
[facility] called and stated the pt heart rate around 1015 am resting was 100 and after light range of motion excerises was 99 around 1045. She was a 8 out of 10 on pain scale, after pain pill she was a 6 out of 10 pain scale.  Her plan of care is OT 2 week for 1 wk and 1 time a wk for 1 wk. Zero times a wk for 1 wk then 1 time a wk for 1 wk.  She is being seen for Iidl training, ADL transfers excerise, fall prevention, infection control and House promotion. Just wanted you to know and for you to call him back with parameters

## 2016-05-29 NOTE — Telephone Encounter (Signed)
Called don no answer LMOM w/Greg response...Johny Chess

## 2016-06-11 ENCOUNTER — Ambulatory Visit: Payer: Medicare HMO | Admitting: Gastroenterology

## 2016-06-12 ENCOUNTER — Inpatient Hospital Stay: Payer: Medicare HMO | Admitting: Family

## 2016-06-13 NOTE — Progress Notes (Signed)
Subjective:    Patient ID: [name], female    DOB: [birth year], 70 y.o.   MRN: [id number]  Chief Complaint  Patient presents with  . Hospitalization Follow-up    needs bone density, refill of ambien, stool softner, anti inflammatory    HPI:  [name] is a 70 y.o. female who  has a past medical history of Alcohol abuse; Alcohol abuse; Allergy; Arthritis; Asthma; GERD (gastroesophageal reflux disease); H/O measles; H/O mumps; Hypercholesteremia; Insomnia; Psoriasis (a type of skin inflammation); Renal cell carcinoma (2012); and Seasonal allergies. and presents today for a hospital follow up.  Recently evaluated in the emergency department and admitted to the hospital following a collapse on the stairs while moving following approximately 3 stairs with immediate left hip pain and could not walk. X-ray imaging showed a comminuted distal femur fracture. Orthopedics was consulted and placed an intramedullary nail on 6/5. She was also placed on Lovenox for DVT prophylaxis. Orthopedics recommended pharmacologic prophylaxis for at least 2 weeks given high risk for developing feet TEE with recent gastric ulcers. She was discharged to skilled nursing facility where she can be monitored more closely. Instructed to follow-up if she develops melena, hematemesis or hematemesis. She was seen by physical therapy also recommended for skilled nursing facility. She received 2 units of pack red blood cells secondary to anemia most likely related to previous GI bleed. She was continued on her proton pump inhibitors secondary to gastric ulcers. Hemoglobin was stable at 8.7 on discharge. She was advised to continue with alcohol cessation. Also noted to have small varices and portal gastropathy on EGD from her previous hospitalization on 5/28 with instructions follow-up with gastroenterology. Her distal left leg swelling was likely due to fracture and surgery. Hospitalist recommended DVT prophylaxis until 04/29/16;  repeat CBC and basic metabolic profile; and ensure follow-up with orthopedics. All hospital records, labs, and imaging reviewed in detail.  Since leaving the hospital she continues to experience left lower extremity pain described as sharp. Pain generally waxes and wanes. Has significant amount of swelling in her left thigh and knee. Reports taking oxycodone as prescribed and notes that it makes her pain more manageable. Continues to work with physical therapy and making good improvements. Has her next appointment with Dr. Ricki Rodriguez in about 1 month after seeing him about 1 week ago.    Allergies  Allergen Reactions  . Azithromycin Itching and Swelling  . Morphine And Related Itching     Current Outpatient Prescriptions on File Prior to Visit  Medication Sig Dispense Refill  . albuterol (PROVENTIL HFA;VENTOLIN HFA) 108 (90 Base) MCG/ACT inhaler Inhale 2 puffs into the lungs every 6 (six) hours as needed for wheezing or shortness of breath. 1 Inhaler 4  . diphenhydrAMINE (BENADRYL) 25 MG tablet Take 25 mg by mouth every 6 (six) hours as needed for itching.    . DULoxetine (CYMBALTA) 60 MG capsule Take 1 capsule (60 mg total) by mouth daily. 30 capsule 0  . enoxaparin (LOVENOX) 40 MG/0.4ML injection Inject 0.4 mLs (40 mg total) into the skin daily. STOP ON 04/29/16 11 Syringe 0  . ferrous sulfate 325 (65 FE) MG tablet Take 1 tablet (325 mg total) by mouth 3 (three) times daily after meals. 90 tablet 0  . Fluticasone-Salmeterol (ADVAIR) 500-50 MCG/DOSE AEPB Inhale 1 puff into the lungs 2 (two) times daily. 60 each 0  . folic acid (FOLVITE) 1 MG tablet Take 1 tablet (1 mg total) by mouth daily. 30 tablet 0  .  LORazepam (ATIVAN) 1 MG tablet Take 1 tablet (1 mg total) by mouth 2 (two) times daily as needed for anxiety (Anxiety, shakiness. DO NOT USE WITH ALCOHOL.). 20 tablet 0  . methocarbamol (ROBAXIN) 500 MG tablet Take 500 mg by mouth every 6 (six) hours as needed for muscle spasms.    . montelukast  (SINGULAIR) 10 MG tablet Take 1 tablet (10 mg total) by mouth at bedtime. 30 tablet 0  . Multiple Vitamin (MULTIVITAMIN WITH MINERALS) TABS tablet Take 1 tablet by mouth daily. 30 tablet 0  . oxyCODONE (ROXICODONE) 15 MG immediate release tablet Take 15 mg by mouth every 3 (three) hours as needed for pain.    . pantoprazole (PROTONIX) 40 MG tablet Take 1 tablet (40 mg total) by mouth 2 (two) times daily. 60 tablet 0  . PARoxetine (PAXIL) 10 MG tablet Take 0.5 tablets (5 mg total) by mouth daily. 30 tablet 0  . polyethylene glycol (MIRALAX / GLYCOLAX) packet Take 17 g by mouth daily. 14 each 0  . simvastatin (ZOCOR) 40 MG tablet Take 1 tablet (40 mg total) by mouth every morning. 30 tablet 0  . sucralfate (CARAFATE) 1 g tablet Take 1 tablet (1 g total) by mouth 4 (four) times daily -  with meals and at bedtime. 120 tablet 0  . thiamine 100 MG tablet Take 1 tablet (100 mg total) by mouth daily. 30 tablet 0   No current facility-administered medications on file prior to visit.      Past Surgical History:  Procedure Laterality Date  . ABDOMINAL HYSTERECTOMY  40years ago  . BUNIONECTOMY  04/2011  . CHOLECYSTECTOMY  11/13/2011   Procedure: LAPAROSCOPIC CHOLECYSTECTOMY WITH INTRAOPERATIVE CHOLANGIOGRAM;  Surgeon: Judieth Keens, DO;  Location: [facility];  Service: General;  Laterality: N/A;  . COLONOSCOPY N/A 09/25/2013   Procedure: COLONOSCOPY;  Surgeon: Lear Ng, MD;  [facility];  Service: Endoscopy;  Laterality: N/A;  . ESOPHAGOGASTRODUODENOSCOPY N/A 09/25/2013   Procedure: ESOPHAGOGASTRODUODENOSCOPY (EGD);  Surgeon: Lear Ng, MD;  Location: [facility];  Service: Endoscopy;  Laterality: N/A;  . ESOPHAGOGASTRODUODENOSCOPY N/A 04/07/2016   Procedure: ESOPHAGOGASTRODUODENOSCOPY (EGD);  Surgeon: Milus Banister, MD;  Location: [facility];  Service: Endoscopy;  Laterality: N/A;  . ESOPHAGOGASTRODUODENOSCOPY (EGD) WITH PROPOFOL Left 01/03/2016   Procedure:  ESOPHAGOGASTRODUODENOSCOPY (EGD) WITH PROPOFOL;  Surgeon: Arta Silence, MD;  [facility];  Service: Endoscopy;  Laterality: Left;  . FEMUR IM NAIL Left 04/15/2016   Procedure: INTRAMEDULLARY (IM) RETROGRADE FEMORAL NAILING;  Surgeon: Gaynelle Arabian, MD;  Location: [facility];  Service: Orthopedics;  Laterality: Left;  . HERNIA REPAIR  [id number]   supraumbilical repair  . KIDNEY SURGERY  12/2010   [facility]; partial nephrectomy  . LUMBAR LAMINECTOMY/DECOMPRESSION MICRODISCECTOMY Right 10/12/2014   Procedure: HEMI LAMINECTOMY MICRODISCECTOMY L5-S1 RIGHT (1 LEVEL);  Surgeon: Tobi Bastos, MD;  Location: [facility];  Service: Orthopedics;  Laterality: Right;  . MENISECTOMY  2010   left knee  . TUBAL LIGATION  44 years ago    Past Medical History:  Diagnosis Date  . Alcohol abuse   . Alcohol abuse   . Allergy   . Arthritis    back-severe, hips, right knee  . Asthma   . GERD (gastroesophageal reflux disease)    occasional  . H/O measles   . H/O mumps   . Hypercholesteremia    under control  . Insomnia   . Psoriasis (a type of skin inflammation)   . Renal cell carcinoma 2012  left  . Seasonal allergies      Review of Systems  Constitutional: Negative for chills and fever.  Cardiovascular: Positive for leg swelling.  Musculoskeletal: Positive for arthralgias.  Neurological: Negative for weakness and numbness.      Objective:    BP 136/80 (BP Location: Left Arm, Patient Position: Sitting, Cuff Size: Normal)   Pulse (!) 110   Temp 97.9 F (36.6 C) (Oral)   Resp 16   Ht 5\' 8"  (1.727 m)   SpO2 99%  Nursing note and vital signs reviewed.  Physical Exam  Constitutional: She is oriented to person, place, and time. She appears well-developed and well-nourished. No distress.  Cardiovascular: Normal rate, regular rhythm, normal heart sounds and intact distal pulses.   Pulmonary/Chest: Effort normal and breath sounds normal.  Musculoskeletal:  Lower left extremity - moderate  edema with mild warmth of left knee and thigh. Well-healing surgical incision. Range of motion is limited secondary to discomfort. Currently in a wheelchair with limited mobility. Ligamentous and meniscal testing deferred. Distal pulses and sensation are intact with no calf pain. Negative Homans sign.  Neurological: She is alert and oriented to person, place, and time.  Skin: Skin is warm and dry.  Psychiatric: She has a normal mood and affect. Her behavior is normal. Judgment and thought content normal.       Assessment & Plan:   Problem List Items Addressed This Visit      Musculoskeletal and Integument   Displaced oblique fracture of shaft of left femur ([place]) - Primary    Femur fracture repaired status post intramedullary nail placement appears to be healing well with no evidence of infection or other complications. Continue to avoid anti-inflammatory secondary to previous gastrointestinal bleeding. Continue pain management and physical therapy per orthopedics. Obtain CBC and complete metabolic profile. Continue to monitor.      Relevant Medications   docusate sodium (COLACE) 100 MG capsule   Other Relevant Orders   DG Bone Density   CBC (Completed)   Comprehensive metabolic panel (Completed)     Other   Insomnia   Relevant Medications   zolpidem (AMBIEN) 5 MG tablet    Other Visit Diagnoses   None.      I am having Ms. Oxley start on docusate sodium. I am also having her maintain her albuterol, LORazepam, DULoxetine, enoxaparin, ferrous sulfate, Fluticasone-Salmeterol, folic acid, montelukast, multivitamin with minerals, pantoprazole, PARoxetine, polyethylene glycol, simvastatin, sucralfate, thiamine, oxyCODONE, diphenhydrAMINE, methocarbamol, famotidine, and zolpidem.   Meds ordered this encounter  Medications  . famotidine (PEPCID) 20 MG tablet    Sig: Take 20 mg by mouth 2 (two) times daily.  Marland Kitchen zolpidem (AMBIEN) 5 MG tablet    Sig: Take 1 tablet (5 mg total) by mouth at  bedtime as needed for sleep.    Dispense:  30 tablet    Refill:  0    Order Specific Question:   Supervising Provider    Answer:   Pricilla Holm A [id number]  . docusate sodium (COLACE) 100 MG capsule    Sig: Take 1 capsule (100 mg total) by mouth 2 (two) times daily.    Dispense:  60 capsule    Refill:  0    Order Specific Question:   Supervising Provider    Answer:   Pricilla Holm A [id number]     Follow-up: Return in about 2 months (around 08/14/2016), or if symptoms worsen or fail to improve.  [name], FNP

## 2016-06-14 ENCOUNTER — Encounter: Payer: Self-pay | Admitting: Family

## 2016-06-14 ENCOUNTER — Other Ambulatory Visit (INDEPENDENT_AMBULATORY_CARE_PROVIDER_SITE_OTHER): Payer: Medicare HMO

## 2016-06-14 ENCOUNTER — Ambulatory Visit (INDEPENDENT_AMBULATORY_CARE_PROVIDER_SITE_OTHER): Payer: Medicare HMO | Admitting: Family

## 2016-06-14 ENCOUNTER — Ambulatory Visit (INDEPENDENT_AMBULATORY_CARE_PROVIDER_SITE_OTHER)
Admission: RE | Admit: 2016-06-14 | Discharge: 2016-06-14 | Disposition: A | Discharge status: Home / Self Care | Payer: Medicare HMO | Source: Ambulatory Visit | Attending: Family | Admitting: Family

## 2016-06-14 VITALS — BP 136/80 | HR 110 | Temp 97.9°F | Resp 16 | Ht 68.0 in

## 2016-06-14 DIAGNOSIS — S72332D Displaced oblique fracture of shaft of left femur, subsequent encounter for closed fracture with routine healing: Secondary | ICD-10-CM

## 2016-06-14 DIAGNOSIS — G4700 Insomnia, unspecified: Secondary | ICD-10-CM | POA: Diagnosis not present

## 2016-06-14 LAB — COMPREHENSIVE METABOLIC PANEL
ALT: 13 U/L (ref 0–35)
AST: 37 U/L (ref 0–37)
Albumin: 3.4 g/dL — ABNORMAL LOW (ref 3.5–5.2)
Alkaline Phosphatase: 527 U/L — ABNORMAL HIGH (ref 39–117)
BUN: 8 mg/dL (ref 6–23)
CO2: 29 meq/L (ref 19–32)
Calcium: 9.3 mg/dL (ref 8.4–10.5)
Chloride: 100 mEq/L (ref 96–112)
Creatinine, Ser: 0.6 mg/dL (ref 0.40–1.20)
GFR: 127.94 mL/min (ref >60.00)
GLUCOSE: 85 mg/dL (ref 70–99)
POTASSIUM: 4.5 meq/L (ref 3.5–5.1)
Sodium: 138 mEq/L (ref 135–145)
Total Bilirubin: 0.5 mg/dL (ref 0.2–1.2)
Total Protein: 7.8 g/dL (ref 6.0–8.3)

## 2016-06-14 LAB — CBC
HEMATOCRIT: 39.2 % (ref 36.0–46.0)
HEMOGLOBIN: 12.7 g/dL (ref 12.0–15.0)
MCHC: 32.4 g/dL (ref 30.0–36.0)
MCV: 90.1 fl (ref 78.0–100.0)
Platelets: 482 10*3/uL — ABNORMAL HIGH (ref 150.0–400.0)
RBC: 4.36 Mil/uL (ref 3.87–5.11)
RDW: 20.2 % — AB (ref 11.5–15.5)
WBC: 7.2 10*3/uL (ref 4.0–10.5)

## 2016-06-14 MED ORDER — DOCUSATE SODIUM 100 MG PO CAPS: 100.0000 mg | ORAL_CAPSULE | Freq: Two times a day (BID) | ORAL | 0 refills | Status: DC

## 2016-06-14 MED ORDER — ZOLPIDEM TARTRATE 5 MG PO TABS: 5.0000 mg | ORAL_TABLET | Freq: Every evening | ORAL | 0 refills | Status: DC | PRN

## 2016-06-14 NOTE — Patient Instructions (Addendum)
Thank you for choosing [facility].  Summary/Instructions:  Please continue to take your medications as prescribed.   Continue to follow up with Dr. Ricki Rodriguez.   Continue to work with physical therapy.  They will call to schedule your bone mineral density.   Your prescription(s) have been submitted to your pharmacy or been printed and provided for you. Please take as directed and contact our office if you believe you are having problem(s) with the medication(s) or have any questions.  Please stop by the lab on the lower level of the building for your blood work. Your results will be released to [place] (or called to you) after review, usually within 72 hours after test completion. If any changes need to be made, you will be notified at that same time.  1. The lab is open from 7:30am to 5:30 pm Monday-Friday  2. No appointment is necessary  3. Fasting (if needed) is 6-8 hours after food and drink; black coffee and water  are okay   If your symptoms worsen or fail to improve, please contact our office for further instruction, or in case of emergency go directly to the emergency room at the closest medical facility.

## 2016-06-14 NOTE — Assessment & Plan Note (Addendum)
Femur fracture repaired status post intramedullary nail placement appears to be healing well with no evidence of infection or other complications. Continue to avoid anti-inflammatory secondary to previous gastrointestinal bleeding. Continue pain management and physical therapy per orthopedics. Obtain CBC and complete metabolic profile. Continue to monitor.

## 2016-06-17 ENCOUNTER — Encounter: Payer: Self-pay | Admitting: Family

## 2016-06-24 ENCOUNTER — Telehealth: Payer: Self-pay

## 2016-06-24 NOTE — Telephone Encounter (Signed)
[name] is wanting to check on prolia for pt. How do I go about this?

## 2016-06-24 NOTE — Telephone Encounter (Signed)
I will be reviewing patient's tscore/dexa scan and submitting for insurance verification in prolia portal--- I will call patient back to arrange prolia injection after insurance is verified---and I will update greg with info

## 2016-07-01 ENCOUNTER — Telehealth: Payer: Self-pay | Admitting: Emergency Medicine

## 2016-07-01 NOTE — Telephone Encounter (Signed)
Made appt instead

## 2016-07-02 ENCOUNTER — Ambulatory Visit: Payer: Medicare HMO | Admitting: Family

## 2016-07-02 ENCOUNTER — Telehealth: Payer: Self-pay | Admitting: Family

## 2016-07-02 NOTE — Telephone Encounter (Signed)
Patient called today to state she would not make it to appt for medication fu.  Patient did not want to reschedule.  Please advise.

## 2016-07-02 NOTE — Telephone Encounter (Signed)
Patient with seven no show appointments and discharge paperwork initiated.

## 2016-07-03 NOTE — Addendum Note (Signed)
Addended by: Delice Bison E on: 07/03/2016 08:48 AM   Modules accepted: Orders

## 2016-07-03 NOTE — Telephone Encounter (Signed)
All dismissal paperwork was signed and given back to [place]

## 2016-07-05 ENCOUNTER — Telehealth: Payer: Self-pay | Admitting: Family

## 2016-07-05 NOTE — Telephone Encounter (Signed)
[name] is calling to report a HR- 106 at rest - pain level 7/10- patient did take pain pill and decreased to 5/10   Patient reports she feels just a little nervous. Asymptomatic other than the pain itself

## 2016-07-05 NOTE — Telephone Encounter (Signed)
Patient dismissed from [facility] by [name] FNP , effective July 02, 2016. Dismissal letter sent out by certified / registered mail. DAJ

## 2016-07-08 NOTE — Telephone Encounter (Signed)
Noted continue to monitor

## 2016-07-12 ENCOUNTER — Other Ambulatory Visit: Payer: Self-pay | Admitting: Family

## 2016-07-12 DIAGNOSIS — G4700 Insomnia, unspecified: Secondary | ICD-10-CM

## 2016-07-12 NOTE — Telephone Encounter (Signed)
Last refill was 8/4

## 2016-07-12 NOTE — Telephone Encounter (Signed)
Rx faxed

## 2016-07-13 ENCOUNTER — Other Ambulatory Visit: Payer: Self-pay | Admitting: Family

## 2016-07-13 DIAGNOSIS — G4700 Insomnia, unspecified: Secondary | ICD-10-CM

## 2016-07-16 NOTE — Telephone Encounter (Signed)
Received signed domestic return receipt verifying delivery of certified letter on July 09, 2016. Article number [id number] [place] [facility]

## 2016-07-16 NOTE — Telephone Encounter (Signed)
Noted  

## 2016-07-19 ENCOUNTER — Other Ambulatory Visit: Payer: Self-pay | Admitting: Family

## 2016-07-22 ENCOUNTER — Telehealth: Payer: Self-pay | Admitting: *Deleted

## 2016-07-22 MED ORDER — LORAZEPAM 1 MG PO TABS: 1.0000 mg | ORAL_TABLET | Freq: Two times a day (BID) | ORAL | 0 refills | Status: DC | PRN

## 2016-07-22 NOTE — Telephone Encounter (Signed)
Medication refilled

## 2016-07-22 NOTE — Telephone Encounter (Signed)
Rec'd call pt requesting refills on her Lorazepam.../lmb

## 2016-07-23 NOTE — Telephone Encounter (Signed)
Faxed script to [facility]/[address].../lmb

## 2016-08-01 ENCOUNTER — Other Ambulatory Visit: Payer: Self-pay | Admitting: Family

## 2016-08-01 DIAGNOSIS — J302 Other seasonal allergic rhinitis: Secondary | ICD-10-CM

## 2016-08-01 DIAGNOSIS — G4700 Insomnia, unspecified: Secondary | ICD-10-CM

## 2016-08-01 MED ORDER — PANTOPRAZOLE SODIUM 40 MG PO TBEC: 40.0000 mg | DELAYED_RELEASE_TABLET | Freq: Two times a day (BID) | ORAL | 0 refills | Status: DC

## 2016-08-01 MED ORDER — PAROXETINE HCL 10 MG PO TABS: 5.0000 mg | ORAL_TABLET | Freq: Every day | ORAL | 0 refills | Status: DC

## 2016-08-01 MED ORDER — POLYETHYLENE GLYCOL 3350 17 G PO PACK: 17.0000 g | PACK | Freq: Every day | ORAL | 0 refills | Status: DC

## 2016-08-01 MED ORDER — POTASSIUM CHLORIDE CRYS ER 20 MEQ PO TBCR: 20.0000 meq | EXTENDED_RELEASE_TABLET | Freq: Every day | ORAL | 0 refills | Status: DC

## 2016-08-01 MED ORDER — METHOCARBAMOL 500 MG PO TABS: 500.0000 mg | ORAL_TABLET | Freq: Four times a day (QID) | ORAL | 0 refills | Status: DC | PRN

## 2016-08-01 MED ORDER — ZOLPIDEM TARTRATE 5 MG PO TABS: 5.0000 mg | ORAL_TABLET | Freq: Every evening | ORAL | 0 refills | Status: DC | PRN

## 2016-08-01 MED ORDER — LORAZEPAM 1 MG PO TABS: 1.0000 mg | ORAL_TABLET | Freq: Two times a day (BID) | ORAL | 0 refills | Status: DC | PRN

## 2016-08-01 MED ORDER — ALBUTEROL SULFATE HFA 108 (90 BASE) MCG/ACT IN AERS: 2.0000 | INHALATION_SPRAY | Freq: Four times a day (QID) | RESPIRATORY_TRACT | 1 refills | Status: DC | PRN

## 2016-08-01 MED ORDER — FAMOTIDINE 20 MG PO TABS: 20.0000 mg | ORAL_TABLET | Freq: Two times a day (BID) | ORAL | 0 refills | Status: DC

## 2016-08-01 MED ORDER — SUCRALFATE 1 G PO TABS: 1.0000 g | ORAL_TABLET | Freq: Three times a day (TID) | ORAL | 0 refills | Status: DC

## 2016-08-01 MED ORDER — SIMVASTATIN 40 MG PO TABS: 40.0000 mg | ORAL_TABLET | Freq: Every morning | ORAL | 0 refills | Status: DC

## 2016-08-01 MED ORDER — FLUTICASONE-SALMETEROL 500-50 MCG/DOSE IN AEPB: INHALATION_SPRAY | RESPIRATORY_TRACT | 1 refills | Status: AC

## 2016-08-01 MED ORDER — MONTELUKAST SODIUM 10 MG PO TABS: 10.0000 mg | ORAL_TABLET | Freq: Every day | ORAL | 0 refills | Status: DC

## 2016-08-01 MED ORDER — DULOXETINE HCL 60 MG PO CPEP: 60.0000 mg | ORAL_CAPSULE | Freq: Every day | ORAL | 0 refills | Status: DC

## 2016-08-05 ENCOUNTER — Telehealth: Payer: Self-pay | Admitting: Emergency Medicine

## 2016-08-05 MED ORDER — SIMVASTATIN 40 MG PO TABS: 40.0000 mg | ORAL_TABLET | Freq: Every morning | ORAL | 1 refills | Status: AC

## 2016-08-05 MED ORDER — PANTOPRAZOLE SODIUM 40 MG PO TBEC: 40.0000 mg | DELAYED_RELEASE_TABLET | Freq: Two times a day (BID) | ORAL | 1 refills | Status: DC

## 2016-08-05 MED ORDER — POTASSIUM CHLORIDE CRYS ER 20 MEQ PO TBCR: 20.0000 meq | EXTENDED_RELEASE_TABLET | Freq: Every day | ORAL | 1 refills | Status: AC

## 2016-08-05 MED ORDER — ALBUTEROL SULFATE HFA 108 (90 BASE) MCG/ACT IN AERS: 2.0000 | INHALATION_SPRAY | Freq: Four times a day (QID) | RESPIRATORY_TRACT | 1 refills | Status: AC | PRN

## 2016-08-05 MED ORDER — SUCRALFATE 1 G PO TABS: 1.0000 g | ORAL_TABLET | Freq: Three times a day (TID) | ORAL | 1 refills | Status: DC

## 2016-08-05 MED ORDER — DULOXETINE HCL 60 MG PO CPEP: 60.0000 mg | ORAL_CAPSULE | Freq: Every day | ORAL | 1 refills | Status: DC

## 2016-08-05 NOTE — Telephone Encounter (Signed)
erx sent on all maintenance medications.

## 2016-08-05 NOTE — Telephone Encounter (Signed)
[employer] faxed a prescription request on 9/18 for:  pantoprazole (PROTONIX) 40 MG tablet  albuterol (PROVENTIL HFA;VENTOLIN HFA) 108 (90 Base) MCG/ACT inhaler DULoxetine (CYMBALTA) 60 MG capsule Tylenol #3, potassium chloride SA (K-DUR,KLOR-CON) 20 MEQ tablet LORazepam (ATIVAN) 1 MG tablet simvastatin (ZOCOR) 40 MG tablet sucralfate (CARAFATE) 1 g tablet Tramadol zolpidem (AMBIEN) 5 MG tablet   I stated some of them had been send but they asked if you resend them because they didn't get them all. Please advise thanks.

## 2016-08-20 ENCOUNTER — Telehealth: Payer: Self-pay | Admitting: *Deleted

## 2016-08-20 NOTE — Telephone Encounter (Signed)
Rec'd call from [facility] wanting to clarify directions on pt Sucralfate. Gave directions on sucralfate from 9/25...[name]

## 2016-08-22 ENCOUNTER — Other Ambulatory Visit: Payer: Self-pay | Admitting: Family

## 2016-08-22 DIAGNOSIS — G4700 Insomnia, unspecified: Secondary | ICD-10-CM

## 2016-08-24 ENCOUNTER — Other Ambulatory Visit: Payer: Self-pay | Admitting: Family

## 2016-09-02 ENCOUNTER — Other Ambulatory Visit: Payer: Self-pay | Admitting: Family

## 2016-09-02 DIAGNOSIS — J302 Other seasonal allergic rhinitis: Secondary | ICD-10-CM

## 2016-10-19 ENCOUNTER — Other Ambulatory Visit: Payer: Self-pay | Admitting: Family

## 2016-10-19 DIAGNOSIS — G4700 Insomnia, unspecified: Secondary | ICD-10-CM

## 2016-10-25 ENCOUNTER — Encounter (HOSPITAL_COMMUNITY): Payer: Self-pay

## 2016-10-25 ENCOUNTER — Inpatient Hospital Stay (HOSPITAL_COMMUNITY)
Admission: EM | Admit: 2016-10-25 | Discharge: 2016-10-27 | DRG: 377 | Disposition: A | Discharge status: Home Health | Payer: Medicare HMO | Attending: Internal Medicine | Admitting: Internal Medicine

## 2016-10-25 DIAGNOSIS — M13852 Other specified arthritis, left hip: Secondary | ICD-10-CM | POA: Diagnosis present

## 2016-10-25 DIAGNOSIS — R112 Nausea with vomiting, unspecified: Secondary | ICD-10-CM

## 2016-10-25 DIAGNOSIS — Z981 Arthrodesis status: Secondary | ICD-10-CM

## 2016-10-25 DIAGNOSIS — F32A Depression, unspecified: Secondary | ICD-10-CM | POA: Diagnosis present

## 2016-10-25 DIAGNOSIS — Z720 Tobacco use: Secondary | ICD-10-CM | POA: Diagnosis present

## 2016-10-25 DIAGNOSIS — E7800 Pure hypercholesterolemia, unspecified: Secondary | ICD-10-CM | POA: Diagnosis present

## 2016-10-25 DIAGNOSIS — D62 Acute posthemorrhagic anemia: Secondary | ICD-10-CM | POA: Diagnosis present

## 2016-10-25 DIAGNOSIS — Q394 Esophageal web: Secondary | ICD-10-CM

## 2016-10-25 DIAGNOSIS — K3189 Other diseases of stomach and duodenum: Secondary | ICD-10-CM | POA: Diagnosis present

## 2016-10-25 DIAGNOSIS — Z885 Allergy status to narcotic agent status: Secondary | ICD-10-CM | POA: Diagnosis not present

## 2016-10-25 DIAGNOSIS — Z8619 Personal history of other infectious and parasitic diseases: Secondary | ICD-10-CM

## 2016-10-25 DIAGNOSIS — Z8249 Family history of ischemic heart disease and other diseases of the circulatory system: Secondary | ICD-10-CM | POA: Diagnosis not present

## 2016-10-25 DIAGNOSIS — F329 Major depressive disorder, single episode, unspecified: Secondary | ICD-10-CM | POA: Diagnosis not present

## 2016-10-25 DIAGNOSIS — D649 Anemia, unspecified: Secondary | ICD-10-CM | POA: Diagnosis present

## 2016-10-25 DIAGNOSIS — D638 Anemia in other chronic diseases classified elsewhere: Secondary | ICD-10-CM | POA: Diagnosis present

## 2016-10-25 DIAGNOSIS — Z7951 Long term (current) use of inhaled steroids: Secondary | ICD-10-CM

## 2016-10-25 DIAGNOSIS — Y998 Other external cause status: Secondary | ICD-10-CM | POA: Diagnosis not present

## 2016-10-25 DIAGNOSIS — Z9181 History of falling: Secondary | ICD-10-CM | POA: Diagnosis not present

## 2016-10-25 DIAGNOSIS — K921 Melena: Secondary | ICD-10-CM

## 2016-10-25 DIAGNOSIS — M13861 Other specified arthritis, right knee: Secondary | ICD-10-CM | POA: Diagnosis present

## 2016-10-25 DIAGNOSIS — Z905 Acquired absence of kidney: Secondary | ICD-10-CM | POA: Diagnosis not present

## 2016-10-25 DIAGNOSIS — Y939 Activity, unspecified: Secondary | ICD-10-CM | POA: Diagnosis not present

## 2016-10-25 DIAGNOSIS — W19XXXA Unspecified fall, initial encounter: Secondary | ICD-10-CM | POA: Diagnosis not present

## 2016-10-25 DIAGNOSIS — K766 Portal hypertension: Secondary | ICD-10-CM | POA: Diagnosis present

## 2016-10-25 DIAGNOSIS — R748 Abnormal levels of other serum enzymes: Secondary | ICD-10-CM

## 2016-10-25 DIAGNOSIS — M4680 Other specified inflammatory spondylopathies, site unspecified: Secondary | ICD-10-CM | POA: Diagnosis present

## 2016-10-25 DIAGNOSIS — Z881 Allergy status to other antibiotic agents status: Secondary | ICD-10-CM

## 2016-10-25 DIAGNOSIS — Z85520 Personal history of malignant carcinoid tumor of kidney: Secondary | ICD-10-CM

## 2016-10-25 DIAGNOSIS — K922 Gastrointestinal hemorrhage, unspecified: Principal | ICD-10-CM | POA: Diagnosis present

## 2016-10-25 DIAGNOSIS — E785 Hyperlipidemia, unspecified: Secondary | ICD-10-CM

## 2016-10-25 DIAGNOSIS — K210 Gastro-esophageal reflux disease with esophagitis: Secondary | ICD-10-CM | POA: Diagnosis present

## 2016-10-25 DIAGNOSIS — F1010 Alcohol abuse, uncomplicated: Secondary | ICD-10-CM | POA: Diagnosis present

## 2016-10-25 DIAGNOSIS — Y92238 Other place in hospital as the place of occurrence of the external cause: Secondary | ICD-10-CM | POA: Diagnosis not present

## 2016-10-25 DIAGNOSIS — M25552 Pain in left hip: Secondary | ICD-10-CM | POA: Diagnosis not present

## 2016-10-25 DIAGNOSIS — R51 Headache: Secondary | ICD-10-CM | POA: Diagnosis present

## 2016-10-25 DIAGNOSIS — J45909 Unspecified asthma, uncomplicated: Secondary | ICD-10-CM | POA: Diagnosis present

## 2016-10-25 DIAGNOSIS — K920 Hematemesis: Secondary | ICD-10-CM

## 2016-10-25 DIAGNOSIS — I8500 Esophageal varices without bleeding: Secondary | ICD-10-CM

## 2016-10-25 DIAGNOSIS — I8501 Esophageal varices with bleeding: Secondary | ICD-10-CM | POA: Diagnosis not present

## 2016-10-25 DIAGNOSIS — K7030 Alcoholic cirrhosis of liver without ascites: Secondary | ICD-10-CM | POA: Diagnosis present

## 2016-10-25 DIAGNOSIS — M13851 Other specified arthritis, right hip: Secondary | ICD-10-CM | POA: Diagnosis present

## 2016-10-25 DIAGNOSIS — Z8711 Personal history of peptic ulcer disease: Secondary | ICD-10-CM | POA: Diagnosis not present

## 2016-10-25 DIAGNOSIS — I8510 Secondary esophageal varices without bleeding: Secondary | ICD-10-CM | POA: Diagnosis present

## 2016-10-25 DIAGNOSIS — G4700 Insomnia, unspecified: Secondary | ICD-10-CM | POA: Diagnosis present

## 2016-10-25 DIAGNOSIS — Z780 Asymptomatic menopausal state: Secondary | ICD-10-CM

## 2016-10-25 DIAGNOSIS — I864 Gastric varices: Secondary | ICD-10-CM | POA: Diagnosis present

## 2016-10-25 DIAGNOSIS — Z79899 Other long term (current) drug therapy: Secondary | ICD-10-CM

## 2016-10-25 LAB — CBC
HEMATOCRIT: 14.8 % — AB (ref 36.0–46.0)
HEMATOCRIT: 14.1 % — AB (ref 36.0–46.0)
HEMOGLOBIN: 4.8 g/dL — AB (ref 12.0–15.0)
HEMOGLOBIN: 4.6 g/dL — AB (ref 12.0–15.0)
MCH: 29.1 pg (ref 26.0–34.0)
MCH: 29.1 pg (ref 26.0–34.0)
MCHC: 32.4 g/dL (ref 30.0–36.0)
MCHC: 32.6 g/dL (ref 30.0–36.0)
MCV: 89.7 fL (ref 78.0–100.0)
MCV: 89.2 fL (ref 78.0–100.0)
Platelets: 267 10*3/uL (ref 150–400)
Platelets: 254 10*3/uL (ref 150–400)
RBC: 1.65 MIL/uL — ABNORMAL LOW (ref 3.87–5.11)
RBC: 1.58 MIL/uL — AB (ref 3.87–5.11)
RDW: 19.4 % — ABNORMAL HIGH (ref 11.5–15.5)
RDW: 19.3 % — ABNORMAL HIGH (ref 11.5–15.5)
WBC: 9 10*3/uL (ref 4.0–10.5)
WBC: 9.9 10*3/uL (ref 4.0–10.5)

## 2016-10-25 LAB — URINALYSIS, ROUTINE W REFLEX MICROSCOPIC
Bilirubin Urine: NEGATIVE
GLUCOSE, UA: NEGATIVE mg/dL
HGB URINE DIPSTICK: NEGATIVE
Ketones, ur: NEGATIVE mg/dL
Leukocytes, UA: NEGATIVE
Nitrite: NEGATIVE
Protein, ur: NEGATIVE mg/dL
SPECIFIC GRAVITY, URINE: 1.013 (ref 1.005–1.030)
pH: 6 (ref 5.0–8.0)

## 2016-10-25 LAB — COMPREHENSIVE METABOLIC PANEL
ALBUMIN: 2.9 g/dL — AB (ref 3.5–5.0)
ALK PHOS: 384 U/L — AB (ref 38–126)
ALT: 15 U/L (ref 14–54)
AST: 34 U/L (ref 15–41)
Anion gap: 10 (ref 5–15)
BILIRUBIN TOTAL: 0.8 mg/dL (ref 0.3–1.2)
BUN: 40 mg/dL — AB (ref 6–20)
CALCIUM: 7.9 mg/dL — AB (ref 8.9–10.3)
CO2: 23 mmol/L (ref 22–32)
CREATININE: 0.87 mg/dL (ref 0.44–1.00)
Chloride: 100 mmol/L — ABNORMAL LOW (ref 101–111)
GFR calc Af Amer: >60 mL/min (ref >60)
GFR calc non Af Amer: >60 mL/min (ref >60)
GLUCOSE: 134 mg/dL — AB (ref 65–99)
Potassium: 4.1 mmol/L (ref 3.5–5.1)
Sodium: 133 mmol/L — ABNORMAL LOW (ref 135–145)
TOTAL PROTEIN: 6.2 g/dL — AB (ref 6.5–8.1)

## 2016-10-25 LAB — POC OCCULT BLOOD, ED: Fecal Occult Bld: POSITIVE — AB

## 2016-10-25 LAB — MRSA PCR SCREENING: MRSA BY PCR: NEGATIVE

## 2016-10-25 LAB — PROTIME-INR
INR: 1.05
Prothrombin Time: 13.8 s (ref 11.4–15.2)

## 2016-10-25 LAB — PREPARE RBC (CROSSMATCH)

## 2016-10-25 LAB — ABO/RH: ABO/RH(D): O POS

## 2016-10-25 LAB — MAGNESIUM: Magnesium: 1.7 mg/dL (ref 1.7–2.4)

## 2016-10-25 MED ORDER — SODIUM CHLORIDE 0.9 % IV SOLN
8.0000 mg/h | INTRAVENOUS | Status: DC
  Administered 2016-10-25 – 2016-10-27 (×4): 8 mg/h via INTRAVENOUS
  Filled 2016-10-25 (×10): qty 80

## 2016-10-25 MED ORDER — SODIUM CHLORIDE 0.9 % IV SOLN: 50.0000 ug/h | INTRAVENOUS | Status: DC

## 2016-10-25 MED ORDER — MOMETASONE FURO-FORMOTEROL FUM 200-5 MCG/ACT IN AERO
2.0000 | INHALATION_SPRAY | Freq: Two times a day (BID) | RESPIRATORY_TRACT | Status: DC
  Administered 2016-10-25 – 2016-10-27 (×4): 2 via RESPIRATORY_TRACT
  Filled 2016-10-25: qty 8.8

## 2016-10-25 MED ORDER — ADULT MULTIVITAMIN W/MINERALS CH
1.0000 | ORAL_TABLET | Freq: Every day | ORAL | Status: DC
  Administered 2016-10-26 – 2016-10-27 (×2): 1 via ORAL
  Filled 2016-10-25 (×2): qty 1

## 2016-10-25 MED ORDER — ALBUTEROL SULFATE (2.5 MG/3ML) 0.083% IN NEBU: 3.0000 mL | INHALATION_SOLUTION | Freq: Four times a day (QID) | RESPIRATORY_TRACT | Status: DC | PRN

## 2016-10-25 MED ORDER — MONTELUKAST SODIUM 10 MG PO TABS
10.0000 mg | ORAL_TABLET | Freq: Every day | ORAL | Status: DC
  Administered 2016-10-25 – 2016-10-26 (×2): 10 mg via ORAL
  Filled 2016-10-25 (×2): qty 1

## 2016-10-25 MED ORDER — SODIUM CHLORIDE 0.9 % IV SOLN
50.0000 ug/h | INTRAVENOUS | Status: DC
  Administered 2016-10-25 – 2016-10-27 (×5): 50 ug/h via INTRAVENOUS
  Filled 2016-10-25 (×9): qty 1

## 2016-10-25 MED ORDER — SIMVASTATIN 40 MG PO TABS
40.0000 mg | ORAL_TABLET | Freq: Every morning | ORAL | Status: DC
  Administered 2016-10-26 – 2016-10-27 (×2): 40 mg via ORAL
  Filled 2016-10-25 (×2): qty 1

## 2016-10-25 MED ORDER — PANTOPRAZOLE SODIUM 40 MG IV SOLR: 40.0000 mg | Freq: Two times a day (BID) | INTRAVENOUS | Status: DC

## 2016-10-25 MED ORDER — PAROXETINE HCL 10 MG PO TABS
5.0000 mg | ORAL_TABLET | Freq: Every day | ORAL | Status: DC
  Administered 2016-10-26 – 2016-10-27 (×2): 5 mg via ORAL
  Filled 2016-10-25 (×2): qty 0.5

## 2016-10-25 MED ORDER — OCTREOTIDE LOAD VIA INFUSION
50.0000 ug | Freq: Once | INTRAVENOUS | Status: AC
  Administered 2016-10-25: 50 ug via INTRAVENOUS
  Filled 2016-10-25: qty 25

## 2016-10-25 MED ORDER — SODIUM CHLORIDE 0.9% FLUSH
3.0000 mL | Freq: Two times a day (BID) | INTRAVENOUS | Status: DC
  Administered 2016-10-25 – 2016-10-26 (×3): 3 mL via INTRAVENOUS

## 2016-10-25 MED ORDER — METHOCARBAMOL 500 MG PO TABS
500.0000 mg | ORAL_TABLET | Freq: Four times a day (QID) | ORAL | Status: DC | PRN
  Administered 2016-10-26 (×2): 500 mg via ORAL
  Filled 2016-10-25 (×2): qty 1

## 2016-10-25 MED ORDER — VITAMIN B-1 100 MG PO TABS
100.0000 mg | ORAL_TABLET | Freq: Every day | ORAL | Status: DC
  Administered 2016-10-26 – 2016-10-27 (×2): 100 mg via ORAL
  Filled 2016-10-25 (×2): qty 1

## 2016-10-25 MED ORDER — FENTANYL CITRATE (PF) 100 MCG/2ML IJ SOLN
50.0000 ug | Freq: Once | INTRAMUSCULAR | Status: AC
  Administered 2016-10-25: 50 ug via INTRAVENOUS
  Filled 2016-10-25: qty 2

## 2016-10-25 MED ORDER — MORPHINE SULFATE (PF) 2 MG/ML IV SOLN
2.0000 mg | INTRAVENOUS | Status: DC | PRN
  Administered 2016-10-25 – 2016-10-26 (×6): 2 mg via INTRAVENOUS
  Filled 2016-10-25 (×7): qty 1

## 2016-10-25 MED ORDER — LORAZEPAM 2 MG/ML IJ SOLN
2.0000 mg | INTRAMUSCULAR | Status: DC | PRN
  Administered 2016-10-26 (×2): 2 mg via INTRAVENOUS
  Filled 2016-10-25 (×2): qty 1

## 2016-10-25 MED ORDER — PANTOPRAZOLE SODIUM 40 MG IV SOLR
40.0000 mg | Freq: Once | INTRAVENOUS | Status: AC
  Administered 2016-10-25: 40 mg via INTRAVENOUS
  Filled 2016-10-25: qty 40

## 2016-10-25 MED ORDER — FOLIC ACID 1 MG PO TABS
1.0000 mg | ORAL_TABLET | Freq: Every day | ORAL | Status: DC
  Administered 2016-10-26 – 2016-10-27 (×2): 1 mg via ORAL
  Filled 2016-10-25 (×2): qty 1

## 2016-10-25 MED ORDER — SODIUM CHLORIDE 0.9 % IV BOLUS (SEPSIS)
1000.0000 mL | Freq: Once | INTRAVENOUS | Status: AC
  Administered 2016-10-25: 1000 mL via INTRAVENOUS

## 2016-10-25 MED ORDER — SODIUM CHLORIDE 0.9 % IV SOLN: INTRAVENOUS | Status: DC

## 2016-10-25 MED ORDER — DULOXETINE HCL 60 MG PO CPEP
60.0000 mg | ORAL_CAPSULE | Freq: Every day | ORAL | Status: DC
  Administered 2016-10-26 – 2016-10-27 (×2): 60 mg via ORAL
  Filled 2016-10-25: qty 2
  Filled 2016-10-25: qty 1

## 2016-10-25 MED ORDER — ZOLPIDEM TARTRATE 5 MG PO TABS
5.0000 mg | ORAL_TABLET | Freq: Every evening | ORAL | Status: DC | PRN
  Administered 2016-10-25 – 2016-10-26 (×2): 5 mg via ORAL
  Filled 2016-10-25 (×2): qty 1

## 2016-10-25 MED ORDER — ONDANSETRON HCL 4 MG/2ML IJ SOLN
4.0000 mg | Freq: Once | INTRAMUSCULAR | Status: AC
  Administered 2016-10-25: 4 mg via INTRAVENOUS
  Filled 2016-10-25: qty 2

## 2016-10-25 MED ORDER — SODIUM CHLORIDE 0.9 % IV SOLN: 10.0000 mL/h | Freq: Once | INTRAVENOUS | Status: DC

## 2016-10-25 NOTE — ED Triage Notes (Signed)
Pt went to primary a week ago and had labs.  Rec'd call today that labs were low in sodium and potassium.  Told to increase her potassium.  However, pt also states that she has had vomiting x 2 days.  Not eating.  Pt husband states that she is "vomiting blood". Pt denies pain.  Pt states she must have had a fever bc she has fever blister.

## 2016-10-25 NOTE — H&P (Signed)
History and Physical    [name] [id number] DOB: 1946-09-30 DOA: 10/25/2016  PCP: No primary care provider on file.  Patient coming from: Home  Chief Complaint: Nausea and vomiting  HPI: [name] is a 70 y.o. female with medical history significant of alcohol abuse, depression, anemia, esophageal varices, hyperlipidemia, insomnia, tobacco abuse. Symptoms started yesterday with dark colored emesis occurring three times. She had one episode of melena as well. This morning, she had three episodes of lose, black stools. She did not take anything to help with her symptoms. She tried drinking chicken noodle soup yesterday, with vomiting. Her last drink was Wednesday.  ED Course: Vitals: afebrile, pulse in 90s to 100s. BP normotensive, on room air. Labs: Sodium of 133. BUN of 40. Hemoglobin of 4.8. Imaging: None obtained Medications/Course: Protonix. Octreotide.  Review of Systems: Review of Systems  Constitutional: Negative for chills and fever.  Respiratory: Negative for cough, hemoptysis, sputum production and shortness of breath.   Cardiovascular: Negative for chest pain and palpitations.  Gastrointestinal: Positive for diarrhea, melena, nausea and vomiting (dark colored). Negative for abdominal pain, blood in stool and constipation.  Neurological: Positive for headaches. Negative for dizziness.  All other systems reviewed and are negative.   Past Medical History:  Diagnosis Date  . Alcohol abuse   . Alcohol abuse   . Allergy   . Arthritis    back-severe, hips, right knee  . Asthma   . GERD (gastroesophageal reflux disease)    occasional  . H/O measles   . H/O mumps   . Hypercholesteremia    under control  . Insomnia   . Psoriasis (a type of skin inflammation)   . Renal cell carcinoma 2012   left  . Seasonal allergies     Past Surgical History:  Procedure Laterality Date  . ABDOMINAL HYSTERECTOMY  40years ago  . BUNIONECTOMY  04/2011  . CHOLECYSTECTOMY   11/13/2011   Procedure: LAPAROSCOPIC CHOLECYSTECTOMY WITH INTRAOPERATIVE CHOLANGIOGRAM;  Surgeon: Judieth Keens, DO;  Location: [facility];  Service: General;  Laterality: N/A;  . COLONOSCOPY N/A 09/25/2013   Procedure: COLONOSCOPY;  Surgeon: Lear Ng, MD;  Location: [facility];  Service: Endoscopy;  Laterality: N/A;  . ESOPHAGOGASTRODUODENOSCOPY N/A 09/25/2013   Procedure: ESOPHAGOGASTRODUODENOSCOPY (EGD);  Surgeon: Lear Ng, MD;  Location: [facility];  Service: Endoscopy;  Laterality: N/A;  . ESOPHAGOGASTRODUODENOSCOPY N/A 04/07/2016   Procedure: ESOPHAGOGASTRODUODENOSCOPY (EGD);  Surgeon: Milus Banister, MD;  Location: [facility];  Service: Endoscopy;  Laterality: N/A;  . ESOPHAGOGASTRODUODENOSCOPY (EGD) WITH PROPOFOL Left 01/03/2016   Procedure: ESOPHAGOGASTRODUODENOSCOPY (EGD) WITH PROPOFOL;  Surgeon: Arta Silence, MD;  Location: [facility];  Service: Endoscopy;  Laterality: Left;  . FEMUR IM NAIL Left 04/15/2016   Procedure: INTRAMEDULLARY (IM) RETROGRADE FEMORAL NAILING;  Surgeon: Gaynelle Arabian, MD;  Location: [facility];  Service: Orthopedics;  Laterality: Left;  . HERNIA REPAIR  [id number]   supraumbilical repair  . KIDNEY SURGERY  12/2010   [facility]; partial nephrectomy  . LUMBAR LAMINECTOMY/DECOMPRESSION MICRODISCECTOMY Right 10/12/2014   Procedure: HEMI LAMINECTOMY MICRODISCECTOMY L5-S1 RIGHT (1 LEVEL);  Surgeon: Tobi Bastos, MD;  Location: [facility];  Service: Orthopedics;  Laterality: Right;  . MENISECTOMY  2010   left knee  . TUBAL LIGATION  44 years ago     reports that she has been smoking Cigarettes.  She has a 0.90 pack-year smoking history. She has never used smokeless tobacco. She reports that she drinks about 1.2 oz of alcohol per week .  She reports that she does not use drugs.  Allergies  Allergen Reactions  . Azithromycin Itching and Swelling  . Morphine And Related Itching    Family History  Problem Relation Age of Onset  . Hyperlipidemia  Mother   . Hypertension Mother     Prior to Admission medications   Medication Sig Start Date End Date Taking? Authorizing Provider  albuterol (PROVENTIL HFA;VENTOLIN HFA) 108 (90 Base) MCG/ACT inhaler Inhale 2 puffs into the lungs every 6 (six) hours as needed for wheezing or shortness of breath. 08/05/16  Yes Golden Circle, FNP  docusate sodium (COLACE) 100 MG capsule Take 1 capsule (100 mg total) by mouth 2 (two) times daily. 06/14/16  Yes Golden Circle, FNP  DULoxetine (CYMBALTA) 60 MG capsule Take 1 capsule (60 mg total) by mouth daily. 08/05/16  Yes Golden Circle, FNP  famotidine (PEPCID) 20 MG tablet Take 1 tablet (20 mg total) by mouth 2 (two) times daily. 08/01/16  Yes Golden Circle, FNP  ferrous sulfate 325 (65 FE) MG tablet Take 1 tablet (325 mg total) by mouth 3 (three) times daily after meals. 04/18/16  Yes Shanker Kristeen Mans, MD  Fluticasone-Salmeterol (ADVAIR DISKUS) 500-50 MCG/DOSE AEPB INHALE 1 PUFF INTO THE LUNGS TWICE A DAY 08/01/16  Yes Golden Circle, FNP  folic acid (FOLVITE) 1 MG tablet Take 1 tablet (1 mg total) by mouth daily. 04/18/16  Yes Shanker Kristeen Mans, MD  LORazepam (ATIVAN) 1 MG tablet Take 1 tablet (1 mg total) by mouth 2 (two) times daily as needed for anxiety (Anxiety, shakiness. DO NOT USE WITH ALCOHOL.). 08/01/16  Yes Golden Circle, FNP  methocarbamol (ROBAXIN) 500 MG tablet Take 1 tablet (500 mg total) by mouth every 6 (six) hours as needed for muscle spasms. 08/01/16  Yes Golden Circle, FNP  montelukast (SINGULAIR) 10 MG tablet Take 1 tablet (10 mg total) by mouth at bedtime. 08/01/16  Yes Golden Circle, FNP  Multiple Vitamin (MULTIVITAMIN WITH MINERALS) TABS tablet Take 1 tablet by mouth daily. 04/18/16  Yes Shanker Kristeen Mans, MD  pantoprazole (PROTONIX) 40 MG tablet Take 1 tablet (40 mg total) by mouth 2 (two) times daily. 08/05/16  Yes Golden Circle, FNP  PARoxetine (PAXIL) 10 MG tablet Take 0.5 tablets (5 mg total) by mouth daily. 08/01/16  Yes  Golden Circle, FNP  polyethylene glycol (MIRALAX / GLYCOLAX) packet Take 17 g by mouth daily. 08/01/16  Yes Golden Circle, FNP  potassium chloride SA (K-DUR,KLOR-CON) 20 MEQ tablet Take 1 tablet (20 mEq total) by mouth daily. Patient taking differently: Take 40 mEq by mouth daily.  08/05/16  Yes Golden Circle, FNP  simvastatin (ZOCOR) 40 MG tablet Take 1 tablet (40 mg total) by mouth every morning. 08/05/16  Yes Golden Circle, FNP  sucralfate (CARAFATE) 1 g tablet Take 1 tablet (1 g total) by mouth 4 (four) times daily -  with meals and at bedtime. 08/05/16  Yes Golden Circle, FNP  thiamine 100 MG tablet Take 1 tablet (100 mg total) by mouth daily. 04/18/16  Yes Shanker Kristeen Mans, MD  zolpidem (AMBIEN) 5 MG tablet Take 1 tablet (5 mg total) by mouth at bedtime as needed. for sleep 08/01/16  Yes Golden Circle, FNP  enoxaparin (LOVENOX) 40 MG/0.4ML injection Inject 0.4 mLs (40 mg total) into the skin daily. STOP ON 04/29/16 Patient not taking: Reported on 10/25/2016 04/18/16   Jonetta Osgood, MD    Physical Exam:  Vitals:   10/25/16 1330 10/25/16 1500 10/25/16 1530 10/25/16 1600  BP: 126/96 131/71 127/74 125/67  Pulse: 95 75 102   Resp: 16 15 16 16   Temp:    98.4 F (36.9 C)  TempSrc:    Oral  SpO2: 100% 100% 100% 100%     Constitutional: NAD, calm, comfortable Eyes: PERRL, lids and conjunctivae normal, no scleral icterus ENMT: Mucous membranes are moist. Posterior pharynx clear of any exudate or lesions. Top dentures  Neck: normal, supple, no masses, no thyromegaly Respiratory: clear to auscultation bilaterally, no wheezing, no crackles. Normal respiratory effort. No accessory muscle use.  Cardiovascular: Regular rate and rhythm, no murmurs / rubs / gallops. No extremity edema. 2+ pedal pulses. Abdomen: no tenderness, no masses palpated. Bowel sounds positive.  Musculoskeletal: no clubbing / cyanosis. No joint deformity upper and lower extremities. Good ROM, no contractures.  Normal muscle tone.  Skin: no rashes, lesions, ulcers. No induration Neurologic: CN 2-12 grossly intact. Sensation intact, DTR normal. Strength 5/5 in all 4.  Psychiatric: Normal judgment and insight. Alert and oriented x 3. Normal mood.    Labs on Admission: I have personally reviewed following labs and imaging studies  CBC:  Recent Labs Lab 10/25/16 1303 10/25/16 1410  WBC 9.0 9.9  HGB 4.8* 4.6*  HCT 14.8* 14.1*  MCV 89.7 89.2  PLT 267 0000000   Basic Metabolic Panel:  Recent Labs Lab 10/25/16 1303  NA 133*  K 4.1  CL 100*  CO2 23  GLUCOSE 134*  BUN 40*  CREATININE 0.87  CALCIUM 7.9*  MG 1.7   GFR: CrCl cannot be calculated (Unknown ideal weight.). Liver Function Tests:  Recent Labs Lab 10/25/16 1303  AST 34  ALT 15  ALKPHOS 384*  BILITOT 0.8  PROT 6.2*  ALBUMIN 2.9*   No results for input(s): LIPASE, AMYLASE in the last 168 hours. No results for input(s): AMMONIA in the last 168 hours. Coagulation Profile:  Recent Labs Lab 10/25/16 1303  INR 1.05   Urine analysis:    Component Value Date/Time   COLORURINE YELLOW 04/13/2016 0436   APPEARANCEUR CLEAR 04/13/2016 0436   LABSPEC 1.019 04/13/2016 0436   PHURINE 6.5 04/13/2016 0436   GLUCOSEU NEGATIVE 04/13/2016 0436   HGBUR NEGATIVE 04/13/2016 0436   HGBUR negative 07/07/2007 0838   BILIRUBINUR NEGATIVE 04/13/2016 0436   KETONESUR NEGATIVE 04/13/2016 0436   PROTEINUR NEGATIVE 04/13/2016 0436   UROBILINOGEN 1.0 04/22/2015 0020   NITRITE NEGATIVE 04/13/2016 0436   LEUKOCYTESUR NEGATIVE 04/13/2016 0436    Radiological Exams on Admission: No results found.   Assessment/Plan Active Problems:   Acute upper GI bleed   Acute GI bleed As evidence by history of dark-colored emesis, melena, significant anemia. Patient has a history of esophageal varices. [facility] consultation in the emergency department. Plan for endoscopy in the morning -Clear liquids -Nothing by mouth after midnight -GI  recommendations: Octreotide, Protonix, transfusion goal greater than 8 -2 units packed red blood cells, H&H upon completion and transfuse 1 more unit if less than 8.  Esophageal varices Cirrhosis -watch fluid status. Currently euvolemic  Anemia of chronic disease Patient is on iron -Hold iron  Alcohol abuse -CIWA  Tobacco abuse Discussed cessation -Patient not interested in nicotine patch  Insomnia -Continue Ambien daily at bedtime when necessary  Depression -Continue home Paxil and Cymbalta   DVT prophylaxis: SCDs Code Status: Full code Family Communication: None at bedside Disposition Plan: Anticipate discharge home in 2-3 days Consults called: [facility], Dr.  Outlaw Admission status: Inpatient, SDU   Cordelia Poche, MD [facility] Pager [phone]  If 7PM-7AM, please contact night-coverage [email or web address] Password [email or web address]  10/25/2016, 5:20 PM

## 2016-10-25 NOTE — ED Notes (Signed)
Pt resting comfortably with VS WNL talking on the phone with family.  Pt updated on her dispo status

## 2016-10-25 NOTE — ED Provider Notes (Signed)
[facility] DEPT Provider Note   CSN: [id number] Arrival date & time: 10/25/16  1219     History   Chief Complaint Chief Complaint  Patient presents with  . Abnormal Lab    HPI [name] is a 70 y.o. female with a PMHx of alcoholism, chronic arthritis, asthma, GERD, gastric ulcers, prior upper GI bleed (EGD 04/07/16 with gastric ulcers, not bleeding), HLD, remote L renal cell carcinoma, and chronic anemia, who presents to the ED accompanied by her spouse, with complaints of nausea and vomiting that began yesterday. Patient's husband states "I think she is having issues with her ulcer again". Patient admits to drinking half a gallon of vodka 2 days ago, and subsequently yesterday developed 3 episodes of coffee-ground emesis, she didn't take anything for her symptoms and today she has not vomited, no known aggravating factors. She's noticed some melanotic stools since yesterday, and states that she's had multiple BMs although denies any watery or diarrhea stools. Endorses associated generalized malaise/weakness but no focal weakness. Chart review reveals that she had an EGD in May that showed gastric ulcers and esophageal varices; patient denies having a GI specialist, and endorses taking PPIs regularly although she cannot recall the name of it.  Additionally she reports that she was called by her PCP (whom she cannot recall the name of, but states it's someone at [facility]) regarding her lab values from last week, states that they told her that her potassium was low but she's not sure of the exact results, she was told to double her Kdur dose; she hasn't been able to due to the n/v.   She denies fevers, chills, CP, SOB, abd pain, diarrhea, constipation, obstipation, hematochezia, hematuria, dysuria, vaginal bleeding/discharge, rectal pain, myalgias, arthralgias, numbness, tingling, focal weakness, or lightheadedness. She denies any recent travel, sick contacts, suspicious food intake,  or NSAID use.   The history is provided by the patient, medical records and the spouse. No language interpreter was used.  Emesis   This is a recurrent problem. The current episode started yesterday. The problem occurs 2 to 4 times per day. The problem has been gradually improving. Emesis appearance: coffee-grounds. There has been no fever. Pertinent negatives include no abdominal pain, no arthralgias, no chills, no diarrhea, no fever and no myalgias.    Past Medical History:  Diagnosis Date  . Alcohol abuse   . Alcohol abuse   . Allergy   . Arthritis    back-severe, hips, right knee  . Asthma   . GERD (gastroesophageal reflux disease)    occasional  . H/O measles   . H/O mumps   . Hypercholesteremia    under control  . Insomnia   . Psoriasis (a type of skin inflammation)   . Renal cell carcinoma 2012   left  . Seasonal allergies     Patient Active Problem List   Diagnosis Date Noted  . Displaced oblique fracture of shaft of left femur ([place]) 04/13/2016  . Acute upper GI bleeding 04/05/2016  . Upper GI bleed 04/05/2016  . Rotator cuff tendinitis 03/11/2016  . Alcohol withdrawal ([place]) 01/04/2016  . Acute blood loss anemia 01/04/2016  . Tobacco abuse 01/03/2016  . Diarrhea 01/03/2016  . Depression 01/03/2016  . Alcohol abuse   . Chronic back pain 08/18/2015  . Seasonal allergies 07/05/2015  . Insomnia 07/05/2015  . Arthritis 07/05/2015  . Psoriasis 07/05/2015  . Menopausal hot flushes 06/01/2015  . Dyspareunia 06/01/2015  . Multiple falls 05/03/2015  . Encounter  for medication review 05/03/2015  . Hot flashes 05/03/2015  . Herniated lumbar intervertebral disc 10/12/2014  . Generalized abdominal pain 09/25/2013  . Anemia of chronic disease 09/11/2013  . Asthma 04/24/2007  . Hyperlipidemia 01/04/2007    Past Surgical History:  Procedure Laterality Date  . ABDOMINAL HYSTERECTOMY  40years ago  . BUNIONECTOMY  04/2011  . CHOLECYSTECTOMY  11/13/2011   Procedure:  LAPAROSCOPIC CHOLECYSTECTOMY WITH INTRAOPERATIVE CHOLANGIOGRAM;  Surgeon: Judieth Keens, DO;  Location: [facility];  Service: General;  Laterality: N/A;  . COLONOSCOPY N/A 09/25/2013   Procedure: COLONOSCOPY;  Surgeon: Lear Ng, MD;  Location: [facility];  Service: Endoscopy;  Laterality: N/A;  . ESOPHAGOGASTRODUODENOSCOPY N/A 09/25/2013   Procedure: ESOPHAGOGASTRODUODENOSCOPY (EGD);  Surgeon: Lear Ng, MD;  Location: [facility];  Service: Endoscopy;  Laterality: N/A;  . ESOPHAGOGASTRODUODENOSCOPY N/A 04/07/2016   Procedure: ESOPHAGOGASTRODUODENOSCOPY (EGD);  Surgeon: Milus Banister, MD;  Location: [facility];  Service: Endoscopy;  Laterality: N/A;  . ESOPHAGOGASTRODUODENOSCOPY (EGD) WITH PROPOFOL Left 01/03/2016   Procedure: ESOPHAGOGASTRODUODENOSCOPY (EGD) WITH PROPOFOL;  Surgeon: Arta Silence, MD;  Location: [facility];  Service: Endoscopy;  Laterality: Left;  . FEMUR IM NAIL Left 04/15/2016   Procedure: INTRAMEDULLARY (IM) RETROGRADE FEMORAL NAILING;  Surgeon: Gaynelle Arabian, MD;  Location: [facility];  Service: Orthopedics;  Laterality: Left;  . HERNIA REPAIR  05/7823   supraumbilical repair  . KIDNEY SURGERY  12/2010   [facility]; partial nephrectomy  . LUMBAR LAMINECTOMY/DECOMPRESSION MICRODISCECTOMY Right 10/12/2014   Procedure: HEMI LAMINECTOMY MICRODISCECTOMY L5-S1 RIGHT (1 LEVEL);  Surgeon: Tobi Bastos, MD;  Location: [facility];  Service: Orthopedics;  Laterality: Right;  . MENISECTOMY  2010   left knee  . TUBAL LIGATION  44 years ago    OB History    Gravida Para Term Preterm AB Living   _0 SAB TAB Ectopic Multiple Live Births                   Home Medications    Prior to Admission medications   Medication Sig Start Date End Date Taking? Authorizing Provider  albuterol (PROVENTIL HFA;VENTOLIN HFA) 108 (90 Base) MCG/ACT inhaler Inhale 2 puffs into the lungs every 6 (six) hours as needed for wheezing or shortness of breath. 08/05/16   Golden Circle, FNP  diphenhydrAMINE (BENADRYL) 25 MG tablet Take 25 mg by mouth every 6 (six) hours as needed for itching.    Historical Provider, MD  docusate sodium (COLACE) 100 MG capsule Take 1 capsule (100 mg total) by mouth 2 (two) times daily. 06/14/16   Golden Circle, FNP  DULoxetine (CYMBALTA) 60 MG capsule Take 1 capsule (60 mg total) by mouth daily. 08/05/16   Golden Circle, FNP  enoxaparin (LOVENOX) 40 MG/0.4ML injection Inject 0.4 mLs (40 mg total) into the skin daily. STOP ON 04/29/16 04/18/16   Shanker Kristeen Mans, MD  famotidine (PEPCID) 20 MG tablet Take 1 tablet (20 mg total) by mouth 2 (two) times daily. 08/01/16   Golden Circle, FNP  ferrous sulfate 325 (65 FE) MG tablet Take 1 tablet (325 mg total) by mouth 3 (three) times daily after meals. 04/18/16   Shanker Kristeen Mans, MD  Fluticasone-Salmeterol (ADVAIR DISKUS) 500-50 MCG/DOSE AEPB INHALE 1 PUFF INTO THE LUNGS TWICE A DAY 08/01/16   Golden Circle, FNP  folic acid (FOLVITE) 1 MG tablet Take 1 tablet (1 mg total) by mouth daily. 04/18/16   Shanker  Kristeen Mans, MD  LORazepam (ATIVAN) 1 MG tablet Take 1 tablet (1 mg total) by mouth 2 (two) times daily as needed for anxiety (Anxiety, shakiness. DO NOT USE WITH ALCOHOL.). 08/01/16   Golden Circle, FNP  methocarbamol (ROBAXIN) 500 MG tablet Take 1 tablet (500 mg total) by mouth every 6 (six) hours as needed for muscle spasms. 08/01/16   Golden Circle, FNP  montelukast (SINGULAIR) 10 MG tablet Take 1 tablet (10 mg total) by mouth at bedtime. 08/01/16   Golden Circle, FNP  Multiple Vitamin (MULTIVITAMIN WITH MINERALS) TABS tablet Take 1 tablet by mouth daily. 04/18/16   Shanker Kristeen Mans, MD  oxyCODONE (ROXICODONE) 15 MG immediate release tablet Take 15 mg by mouth every 3 (three) hours as needed for pain.    Historical Provider, MD  pantoprazole (PROTONIX) 40 MG tablet Take 1 tablet (40 mg total) by mouth 2 (two) times daily. 08/05/16   Golden Circle, FNP  PARoxetine (PAXIL) 10 MG tablet  Take 0.5 tablets (5 mg total) by mouth daily. 08/01/16   Golden Circle, FNP  polyethylene glycol (MIRALAX / GLYCOLAX) packet Take 17 g by mouth daily. 08/01/16   Golden Circle, FNP  potassium chloride SA (K-DUR,KLOR-CON) 20 MEQ tablet Take 1 tablet (20 mEq total) by mouth daily. 08/05/16   Golden Circle, FNP  simvastatin (ZOCOR) 40 MG tablet Take 1 tablet (40 mg total) by mouth every morning. 08/05/16   Golden Circle, FNP  sucralfate (CARAFATE) 1 g tablet Take 1 tablet (1 g total) by mouth 4 (four) times daily -  with meals and at bedtime. 08/05/16   Golden Circle, FNP  thiamine 100 MG tablet Take 1 tablet (100 mg total) by mouth daily. 04/18/16   Shanker Kristeen Mans, MD  zolpidem (AMBIEN) 5 MG tablet Take 1 tablet (5 mg total) by mouth at bedtime as needed. for sleep 08/01/16   Golden Circle, FNP    Family History Family History  Problem Relation Age of Onset  . Hyperlipidemia Mother   . Hypertension Mother     Social History Social History  Substance Use Topics  . Smoking status: Current Some Day Smoker    Packs/day: 0.15    Years: 6.00    Types: Cigarettes  . Smokeless tobacco: Never Used  . Alcohol use 1.2 oz/week    1 Glasses of wine, 1 Shots of liquor per week     Comment: socially last drink 04/10/16      Allergies   Azithromycin and Morphine and related   Review of Systems Review of Systems  Constitutional: Negative for chills and fever.  Respiratory: Negative for shortness of breath.   Cardiovascular: Negative for chest pain.  Gastrointestinal: Positive for blood in stool, nausea and vomiting. Negative for abdominal pain, anal bleeding, constipation, diarrhea and rectal pain.       +hematemesis (coffee grounds)  Genitourinary: Negative for dysuria, hematuria, vaginal bleeding and vaginal discharge.  Musculoskeletal: Negative for arthralgias and myalgias.  Skin: Negative for color change.  Allergic/Immunologic: Negative for immunocompromised state.    Neurological: Negative for weakness, light-headedness and numbness.  Psychiatric/Behavioral: Negative for confusion.   10 Systems reviewed and are negative for acute change except as noted in the HPI.   Physical Exam Updated Vital Signs BP 91/77 (BP Location: Right Arm)   Pulse 100   Temp 97.5 F (36.4 C) (Oral)   Resp 14  RECHECK VS: BP 107/64   Pulse 94  Temp 97.5 F (36.4 C) (Oral)   Resp 18   SpO2 100%    Physical Exam  Constitutional: She is oriented to person, place, and time. Vital signs are normal. She appears well-developed and well-nourished.  Non-toxic appearance. No distress.  Afebrile, nontoxic, NAD, BP soft but consistent with prior values  HENT:  Head: Normocephalic and atraumatic.  Mouth/Throat: Oropharynx is clear and moist and mucous membranes are normal.  Eyes: Conjunctivae and EOM are normal. Right eye exhibits no discharge. Left eye exhibits no discharge.  Mildly pale conjunctiva  Neck: Normal range of motion. Neck supple.  Cardiovascular: Normal rate, regular rhythm, normal heart sounds and intact distal pulses.  Exam reveals no gallop and no friction rub.   No murmur heard. Pulmonary/Chest: Effort normal and breath sounds normal. No respiratory distress. She has no decreased breath sounds. She has no wheezes. She has no rhonchi. She has no rales.  Abdominal: Soft. Normal appearance and bowel sounds are normal. She exhibits no distension. There is no tenderness. There is no rigidity, no rebound, no guarding, no CVA tenderness, no tenderness at McBurney's point and negative Murphy's sign.  Soft, NTND, +BS throughout, no r/g/r, neg murphy's, neg mcburney's, no CVA TTP   Genitourinary: Rectal exam shows external hemorrhoid, internal hemorrhoid and guaiac positive stool. Rectal exam shows no fissure, no mass, no tenderness and anal tone normal. Pelvic exam was performed with patient in the knee-chest position.  Genitourinary Comments: Chaperone present No gross  blood noted on rectal exam however melanotic stool seen; normal tone, no tenderness, no mass or fissure, with several old external hemorrhoidal skin tags, and some palpable internal hemorrhoids, none are tender or thrombosed, no bleeding from hemorrhoids seen. FOBT+  Musculoskeletal: Normal range of motion.  Neurological: She is alert and oriented to person, place, and time. She has normal strength. No sensory deficit.  Skin: Skin is warm, dry and intact. No rash noted.  Psychiatric: She has a normal mood and affect.  Nursing note and vitals reviewed.    ED Treatments / Results  Labs (all labs ordered are listed, but only abnormal results are displayed) Labs Reviewed  CBC - Abnormal; Notable for the following:       Result Value   RBC 1.65 (*)    Hemoglobin 4.8 (*)    HCT 14.8 (*)    RDW 19.4 (*)    All other components within normal limits  COMPREHENSIVE METABOLIC PANEL - Abnormal; Notable for the following:    Sodium 133 (*)    Chloride 100 (*)    Glucose, Bld 134 (*)    BUN 40 (*)    Calcium 7.9 (*)    Total Protein 6.2 (*)    Albumin 2.9 (*)    Alkaline Phosphatase 384 (*)    All other components within normal limits  CBC - Abnormal; Notable for the following:    RBC 1.58 (*)    Hemoglobin 4.6 (*)    HCT 14.1 (*)    RDW 19.3 (*)    All other components within normal limits  POC OCCULT BLOOD, ED - Abnormal; Notable for the following:    Fecal Occult Bld POSITIVE (*)    All other components within normal limits  PROTIME-INR  MAGNESIUM  URINALYSIS, ROUTINE W REFLEX MICROSCOPIC  TYPE AND SCREEN  ABO/RH  PREPARE RBC (CROSSMATCH)    EKG  EKG Interpretation None       Radiology No results found.  Procedures Procedures (including critical care time)  CRITICAL CARE- GI bleed, severe anemia requiring transfusion Performed by: Corine Shelter   Total critical care time: 45 minutes  Critical care time was exclusive of separately billable  procedures and treating other patients.  Critical care was necessary to treat or prevent imminent or life-threatening deterioration.  Critical care was time spent personally by me on the following activities: development of treatment plan with patient and/or surrogate as well as nursing, discussions with consultants, evaluation of patient's response to treatment, examination of patient, obtaining history from patient or surrogate, ordering and performing treatments and interventions, ordering and review of laboratory studies, ordering and review of radiographic studies, pulse oximetry and re-evaluation of patient's condition.   EGD 04/07/16: Impression:  - Small (< 5 mm) esophageal varices.                      - Non-bleeding gastric ulcers with no stigmata of                            bleeding. These ulcers appeared improved compared                            to Dr. Paulita Fujita description, pictures from EGD 2-3                            months ago. Still presumed to be the source of                            recent melena, anemia.                       - Portal hypertensive gastropathy.                       - Normal examined duodenum.                       - No specimens collected. Recommendation: - Return patient to hospital ward for ongoing care.                           - Resume previous diet. I will advance.                           - OK to stop the octreotide and change PPI from                            drip to twice daily, orallly. Otherwise continue                            present medications. PPI was NOT listed on her                            admitting meds. She needs to stay on twice daily                            oral PPI, probably indefinitely.                           -  She needs to strictly avoid NSAIDs                           - Alcohol abuse is her biggest overall health                            concern, really needs to stop.                           - My  office will contact her about follow up appt                            in 6-8 weeks, from there she will be scheduled for                            repeat EGD. Of note, she failed to show for follow                            up with Dr. Paulita Fujita in nearly identical clinical                            situation just 2-3 months ago.                           - If no signs of rebleeding in next 24-36 hours she                            is safe for dischage home.  Medications Ordered in ED Medications  0.9 %  sodium chloride infusion (not administered)  pantoprazole (PROTONIX) 80 mg in sodium chloride 0.9 % 250 mL (0.32 mg/mL) infusion (8 mg/hr Intravenous New Bag/Given 10/25/16 1446)  pantoprazole (PROTONIX) injection 40 mg (not administered)  sodium chloride 0.9 % bolus 1,000 mL (0 mLs Intravenous Stopped 10/25/16 1438)  pantoprazole (PROTONIX) injection 40 mg (40 mg Intravenous Given 10/25/16 1311)  ondansetron (ZOFRAN) injection 4 mg (4 mg Intravenous Given 10/25/16 1311)  fentaNYL (SUBLIMAZE) injection 50 mcg (50 mcg Intravenous Given 10/25/16 1430)  pantoprazole (PROTONIX) injection 40 mg (40 mg Intravenous Given 10/25/16 1439)     Initial Impression / Assessment and Plan / ED Course  I have reviewed the triage vital signs and the nursing notes.  Pertinent labs & imaging results that were available during my care of the patient were reviewed by me and considered in my medical decision making (see chart for details).  Clinical Course     70 y.o. female here with n/v, melena, and coffee ground emesis x1 day after consuming EtOH 2 days ago. Was told by her PCP (at [facility], can't recall doctors name) that her potassium was low and she needed to increase her Kdur, but hasn't been able to due to the n/v. Has been seen in the past for upper GI bleed related to gastric ulcers, EGD in May 2017 showed nonbleeding gastric ulcers and small esophageal varices, plan was to start PPI and  repeat EGD in 6-8 weeks; unclear if she ever went back. On exam, no abdominal tenderness, rectal exam with old hemorrhoidal skin tags externally, and internal hemorrhoids  palpated, no tenderness or thrombosed hemorrhoids, with melanotic stool seen on DRE. BP soft, but consistent with prior values, will have them recheck VS to ensure accuracy; doubt sepsis, pt afebrile and nontoxic appearing. Will get U/A, CBC, CMP, Mg, INR, and type and screen. Will give protonix and zofran, as well as fluids, then reassess. Likely will need admission for upper GI bleed related to alcoholism.   2:04 PM CBC with hgb 4.8, which is much lower than one would anticipate; will recheck result. CMP with K 4.1, Na 133, Alk phos 384 similar to prior values, BUN elevated without Cr elevation (sign of GI bleed). INR WNL. Mg level WNL. FOBT+. VS recheck showing BP 107/64 and HR 94, same as prior visits. Awaiting U/A and repeat CBC, but will likely need to be admitted for GI bleed/transfusion if hgb/hct accurate. Of note, pt now c/o headache, wanting something; will hold off on PO meds until we have repeat CBC values, and want to avoid NSAIDs, so will give small dose of fentanyl for headache. Will reassess shortly.   2:22 PM Repeat CBC confirms Hgb 4.6. Will proceed with transfusion and admission for severe GI bleed. Will consult for admission, and consult GI to advise them of this patient's case. Discussed case with my attending Dr. Venora Maples who agrees with plan.  Pt and family updated by Dr. Venora Maples  3:00 PM Still haven't heard from either consultant. [facility] repaged; secretary will page GI once [facility] returns call.  3:10 PM Dr. Lonny Prude of [facility] returning page and will admit. Alonza Bogus of GI returning page and will see pt in consultation; Holding orders placed. Please see their notes for further documentation of care. I appreciate their help with this pleasant pt's care. Pt stable at time of admission.   Final Clinical Impressions(s) / ED  Diagnoses   Final diagnoses:  Nausea and vomiting in adult patient  Melena  Hematemesis with nausea  Alcohol abuse  Upper GI bleeding  Anemia, unspecified type  Elevated alkaline phosphatase level    New Prescriptions New Prescriptions   No medications on file     Zacarias Pontes, PA-C 10/25/16 1514  ADDENDUM 3:23 PM Alonza Bogus of [facility] returning call again, states that pt has been discharged from [facility] care due to no show on outpatient basis, thus this patient's care needs to be consulted to the on call consulting group ([facility]). Will inform Dr. Lonny Prude of this information and advise that he will need to consult [facility].   [address] [place], PA-C 10/25/16 [place], MD 10/25/16 1531

## 2016-10-25 NOTE — ED Notes (Signed)
Patient notified urine specimen needed

## 2016-10-25 NOTE — Plan of Care (Signed)
[facility] aware of patient's admission.  Would transfuse to Hgb goal >/= 8, continue PPI and octreotide, NPO after midnight, plan for endoscopy tomorrow morning.

## 2016-10-26 ENCOUNTER — Encounter (HOSPITAL_COMMUNITY)
Admission: EM | Disposition: A | Discharge status: Home Health | Payer: Self-pay | Source: Home / Self Care | Attending: Internal Medicine

## 2016-10-26 DIAGNOSIS — I8501 Esophageal varices with bleeding: Secondary | ICD-10-CM

## 2016-10-26 DIAGNOSIS — F1010 Alcohol abuse, uncomplicated: Secondary | ICD-10-CM

## 2016-10-26 DIAGNOSIS — K922 Gastrointestinal hemorrhage, unspecified: Principal | ICD-10-CM

## 2016-10-26 DIAGNOSIS — D638 Anemia in other chronic diseases classified elsewhere: Secondary | ICD-10-CM

## 2016-10-26 HISTORY — PX: ESOPHAGOGASTRODUODENOSCOPY: SHX5428

## 2016-10-26 LAB — CBC
HEMATOCRIT: 21.8 % — AB (ref 36.0–46.0)
HEMOGLOBIN: 7.3 g/dL — AB (ref 12.0–15.0)
MCH: 29.2 pg (ref 26.0–34.0)
MCHC: 33.5 g/dL (ref 30.0–36.0)
MCV: 87.2 fL (ref 78.0–100.0)
Platelets: 235 10*3/uL (ref 150–400)
RBC: 2.5 MIL/uL — ABNORMAL LOW (ref 3.87–5.11)
RDW: 17.5 % — AB (ref 11.5–15.5)
WBC: 10.6 10*3/uL — ABNORMAL HIGH (ref 4.0–10.5)

## 2016-10-26 LAB — COMPREHENSIVE METABOLIC PANEL
ALBUMIN: 2.7 g/dL — AB (ref 3.5–5.0)
ALT: 12 U/L — ABNORMAL LOW (ref 14–54)
AST: 28 U/L (ref 15–41)
Alkaline Phosphatase: 325 U/L — ABNORMAL HIGH (ref 38–126)
Anion gap: 5 (ref 5–15)
BILIRUBIN TOTAL: 0.9 mg/dL (ref 0.3–1.2)
BUN: 24 mg/dL — AB (ref 6–20)
CHLORIDE: 107 mmol/L (ref 101–111)
CO2: 24 mmol/L (ref 22–32)
Calcium: 7.8 mg/dL — ABNORMAL LOW (ref 8.9–10.3)
Creatinine, Ser: 0.76 mg/dL (ref 0.44–1.00)
GFR calc Af Amer: >60 mL/min (ref >60)
GFR calc non Af Amer: >60 mL/min (ref >60)
GLUCOSE: 126 mg/dL — AB (ref 65–99)
POTASSIUM: 4.5 mmol/L (ref 3.5–5.1)
SODIUM: 136 mmol/L (ref 135–145)
Total Protein: 5.6 g/dL — ABNORMAL LOW (ref 6.5–8.1)

## 2016-10-26 LAB — PREPARE RBC (CROSSMATCH)

## 2016-10-26 SURGERY — EGD (ESOPHAGOGASTRODUODENOSCOPY)
Anesthesia: Moderate Sedation

## 2016-10-26 MED ORDER — DIPHENHYDRAMINE HCL 25 MG PO CAPS
25.0000 mg | ORAL_CAPSULE | Freq: Four times a day (QID) | ORAL | Status: DC | PRN
  Administered 2016-10-26: 25 mg via ORAL
  Filled 2016-10-26: qty 1

## 2016-10-26 MED ORDER — DEXTROSE 5 % IV SOLN
2.0000 g | INTRAVENOUS | Status: DC
  Administered 2016-10-26 – 2016-10-27 (×2): 2 g via INTRAVENOUS
  Filled 2016-10-26 (×2): qty 2

## 2016-10-26 MED ORDER — FENTANYL CITRATE (PF) 100 MCG/2ML IJ SOLN
INTRAMUSCULAR | Status: DC | PRN
  Administered 2016-10-26 (×2): 25 ug via INTRAVENOUS

## 2016-10-26 MED ORDER — MIDAZOLAM HCL 10 MG/2ML IJ SOLN
INTRAMUSCULAR | Status: DC | PRN
  Administered 2016-10-26 (×2): 2 mg via INTRAVENOUS

## 2016-10-26 MED ORDER — MIDAZOLAM HCL 5 MG/ML IJ SOLN
INTRAMUSCULAR | Status: AC
  Filled 2016-10-26: qty 2

## 2016-10-26 MED ORDER — SODIUM CHLORIDE 0.9 % IV SOLN
Freq: Once | INTRAVENOUS | Status: AC
  Administered 2016-10-26: 08:00:00 via INTRAVENOUS

## 2016-10-26 MED ORDER — FENTANYL CITRATE (PF) 100 MCG/2ML IJ SOLN
INTRAMUSCULAR | Status: AC
  Filled 2016-10-26: qty 2

## 2016-10-26 NOTE — Progress Notes (Signed)
Pharmacy Antibiotic Note  [name] is a 70 y.o. female admitted on 10/25/2016 with possible intra-abdominal infection (SBP prophylaxis in GIB in patient w/ cirrhosis).  Pharmacy has been consulted for ceftriaxone dosing.  Plan: Ceftriaxone 2gm IV q24h  - no further dose adjustment required (no renal or hepatic dose adjustment required), pharmacy to sign-off  Height: 5\' 5"  (165.1 cm) Weight: 148 lb 2.4 oz (67.2 kg) IBW/kg (Calculated) : 57  Temp (24hrs), Avg:98.7 F (37.1 C), Min:97.5 F (36.4 C), Max:99.9 F (37.7 C)   Recent Labs Lab 10/25/16 1303 10/25/16 1410 10/26/16 0149  WBC 9.0 9.9 10.6*  CREATININE 0.87  --  0.76    Estimated Creatinine Clearance: 60.6 mL/min (by C-G formula based on SCr of 0.76 mg/dL).    Allergies  Allergen Reactions  . Azithromycin Itching and Swelling  . Morphine And Related Itching     Thank you for allowing pharmacy to be a part of this patient's care.  Doreene Eland, PharmD, BCPS.   Pager: [phone] 10/26/2016 7:28 AM

## 2016-10-26 NOTE — Progress Notes (Addendum)
PROGRESS NOTE  [name]  [id number] DOB: Oct 13, 1946 DOA: 10/25/2016 PCP: No primary care provider on file.  Brief Narrative:  [name] is a 70 y.o. female with medical history significant of alcohol abuse, depression, anemia, esophageal varices, gastric ulcers with recurrent hemorrhage, hyperlipidemia, insomnia, tobacco abuse who presented with melena and coffee-ground emesis.  She denies recent NSAID use but did start drinking on Monday and Tuesday of this week.  Bleeding has stopped since admission. She has so far received 2 units of pack red blood cells and her hemoglobin is still less than 8. Gastroneurology has been notified and their note states that they are planning endoscopy for this morning.  Assessment & Plan:   Active Problems:   Hyperlipidemia   Anemia of chronic disease   Insomnia   Alcohol abuse   Tobacco abuse   Depression   Acute upper GI bleed   Esophageal varices (HCC)  Acute GI bleed, appears to be resolving spontaneously. Suspect she irritated her gastric ulcers by drinking alcohol earlier this week. -Continue Octreotide, Protonix - transfuse 2 units pack red blood cells -  start ceftriaxone for prophylaxis  Esophageal varices EtOH Cirrhosis -watch fluid status. Currently euvolemic  Anemia of chronic disease Patient is on iron -Hold iron, but plan to resume at twice a day dosing at discharge  Alcohol abuse -Continue CIWA  Tobacco abuse Discussed cessation -Patient not interested in nicotine patch  Insomnia -Continue Ambien daily at bedtime when necessary  Depression -Continue home Paxil and Cymbalta   DVT prophylaxis: SCDs Code Status: Full code Family Communication: None at bedside Disposition Plan: Anticipate discharge home in 1-2 days  Consultants:   [facility]  Procedures:  None  Antimicrobials:  Anti-infectives    Start     Dose/Rate Route Frequency Ordered Stop   10/26/16 1000  cefTRIAXone  (ROCEPHIN) 2 g in dextrose 5 % 50 mL IVPB     2 g 100 mL/hr over 30 Minutes Intravenous Every 24 hours 10/26/16 0729         Subjective: Always feels a little lightheaded. Denies shortness of breath. Has not had any further vomiting or diarrhea since yesterday.  She has chronic intermittent lower.abdominal pains that occurred overnight but have currently resolved.   Objective: Vitals:   10/26/16 0900 10/26/16 1000 10/26/16 1014 10/26/16 1029  BP: (!) 114/57 (!) 114/55  (!) 126/54  Pulse:      Resp: 14 16 14 14   Temp:  98.1 F (36.7 C)  98.3 F (36.8 C)  TempSrc:  Oral  Oral  SpO2: 100% 100%  100%  Weight:      Height:        Intake/Output Summary (Last 24 hours) at 10/26/16 1126 Last data filed at 10/26/16 1029  Gross per 24 hour  Intake          3599.91 ml  Output             1650 ml  Net          1949.91 ml   Filed Weights   10/25/16 1636  Weight: 67.2 kg (148 lb 2.4 oz)    Examination:  General exam: Thin adult female.  No acute distress.  HEENT:  NCAT, MMM Respiratory system: Clear to auscultation bilaterally Cardiovascular system: Regular rate and rhythm, normal S1/S2. No murmurs, rubs, gallops or clicks.  Warm extremities Gastrointestinal system: Normal active bowel sounds, soft, nondistended, nontender. MSK:  Normal tone and bulk, no lower extremity edema Neuro:  Grossly intact    Data Reviewed: I have personally reviewed following labs and imaging studies  CBC:  Recent Labs Lab 10/25/16 1303 10/25/16 1410 10/26/16 0149  WBC 9.0 9.9 10.6*  HGB 4.8* 4.6* 7.3*  HCT 14.8* 14.1* 21.8*  MCV 89.7 89.2 87.2  PLT 267 254 [id number]   Basic Metabolic Panel:  Recent Labs Lab 10/25/16 1303 10/26/16 0149  NA 133* 136  K 4.1 4.5  CL 100* 107  CO2 23 24  GLUCOSE 134* 126*  BUN 40* 24*  CREATININE 0.87 0.76  CALCIUM 7.9* 7.8*  MG 1.7  --    GFR: Estimated Creatinine Clearance: 60.6 mL/min (by C-G formula based on SCr of 0.76 mg/dL). Liver Function  Tests:  Recent Labs Lab 10/25/16 1303 10/26/16 0149  AST 34 28  ALT 15 12*  ALKPHOS 384* 325*  BILITOT 0.8 0.9  PROT 6.2* 5.6*  ALBUMIN 2.9* 2.7*   No results for input(s): LIPASE, AMYLASE in the last 168 hours. No results for input(s): AMMONIA in the last 168 hours. Coagulation Profile:  Recent Labs Lab 10/25/16 1303  INR 1.05   Cardiac Enzymes: No results for input(s): CKTOTAL, CKMB, CKMBINDEX, TROPONINI in the last 168 hours. BNP (last 3 results) No results for input(s): PROBNP in the last 8760 hours. HbA1C: No results for input(s): HGBA1C in the last 72 hours. CBG: No results for input(s): GLUCAP in the last 168 hours. Lipid Profile: No results for input(s): CHOL, HDL, LDLCALC, TRIG, CHOLHDL, LDLDIRECT in the last 72 hours. Thyroid Function Tests: No results for input(s): TSH, T4TOTAL, FREET4, T3FREE, THYROIDAB in the last 72 hours. Anemia Panel: No results for input(s): VITAMINB12, FOLATE, FERRITIN, TIBC, IRON, RETICCTPCT in the last 72 hours. Urine analysis:    Component Value Date/Time   COLORURINE YELLOW 10/25/2016 1649   APPEARANCEUR HAZY (A) 10/25/2016 1649   LABSPEC 1.013 10/25/2016 1649   PHURINE 6.0 10/25/2016 1649   GLUCOSEU NEGATIVE 10/25/2016 1649   HGBUR NEGATIVE 10/25/2016 1649   HGBUR negative 07/07/2007 0838   BILIRUBINUR NEGATIVE 10/25/2016 1649   KETONESUR NEGATIVE 10/25/2016 1649   PROTEINUR NEGATIVE 10/25/2016 1649   UROBILINOGEN 1.0 04/22/2015 0020   NITRITE NEGATIVE 10/25/2016 1649   LEUKOCYTESUR NEGATIVE 10/25/2016 1649   Sepsis Labs: @LABRCNTIP (procalcitonin:4,lacticidven:4)  ) Recent Results (from the past 240 hour(s))  MRSA PCR Screening     Status: None   Collection Time: 10/25/16  4:55 PM  Result Value Ref Range Status   MRSA by PCR NEGATIVE NEGATIVE Final    Comment:        The GeneXpert MRSA Assay (FDA approved for NASAL specimens only), is one component of a comprehensive MRSA colonization surveillance program. It  is not intended to diagnose MRSA infection nor to guide or monitor treatment for MRSA infections.       Radiology Studies: No results found.   Scheduled Meds: . sodium chloride  10 mL/hr Intravenous Once  . cefTRIAXone (ROCEPHIN)  IV  2 g Intravenous Q24H  . DULoxetine  60 mg Oral Daily  . folic acid  1 mg Oral Daily  . mometasone-formoterol  2 puff Inhalation BID  . montelukast  10 mg Oral QHS  . multivitamin with minerals  1 tablet Oral Daily  . PARoxetine  5 mg Oral Daily  . simvastatin  40 mg Oral q morning - 10a  . sodium chloride flush  3 mL Intravenous Q12H  . thiamine  100 mg Oral Daily   Continuous Infusions: . sodium chloride    .  octreotide  (SANDOSTATIN)    IV infusion 50 mcg/hr (10/26/16 1000)  . pantoprozole (PROTONIX) infusion 8 mg/hr (10/26/16 1000)     LOS: 1 day    Time spent: 30 min    Janece Canterbury, MD [facility] Pager [phone]  If 7PM-7AM, please contact night-coverage [email or web address] Password [email or web address] 10/26/2016, 11:26 AM

## 2016-10-26 NOTE — Consult Note (Signed)
[facility] Consultation Note  Referring Provider: Dr. Janece Canterbury [facility]) Primary Care Physician:  No primary care provider on file.  Reason for Consultation:  Hematemesis, melena, abdominal pain  HPI: [name] is a 70 y.o. female admitted with two-day history of hematemesis, epigastric abdominal pain, melena.  Started in conjunction with increase in alcohol intake.  Denies NSAIDs.  Prior admissions for similar symptoms, has history of gastric ulcers.  Was found to have significant anemia, and has been given blood transfusion overnight.  No further hematemesis overnight.   Past Medical History:  Diagnosis Date  . Alcohol abuse   . Alcohol abuse   . Allergy   . Arthritis    back-severe, hips, right knee  . Asthma   . GERD (gastroesophageal reflux disease)    occasional  . H/O measles   . H/O mumps   . Hypercholesteremia    under control  . Insomnia   . Psoriasis (a type of skin inflammation)   . Renal cell carcinoma 2012   left  . Seasonal allergies     Past Surgical History:  Procedure Laterality Date  . ABDOMINAL HYSTERECTOMY  40years ago  . BUNIONECTOMY  04/2011  . CHOLECYSTECTOMY  11/13/2011   Procedure: LAPAROSCOPIC CHOLECYSTECTOMY WITH INTRAOPERATIVE CHOLANGIOGRAM;  Surgeon: Judieth Keens, DO;  Location: [facility] ORS;  Service: General;  Laterality: N/A;  . COLONOSCOPY N/A 09/25/2013   Procedure: COLONOSCOPY;  Surgeon: Lear Ng, MD;  Location: [facility] ENDOSCOPY;  Service: Endoscopy;  Laterality: N/A;  . ESOPHAGOGASTRODUODENOSCOPY N/A 09/25/2013   Procedure: ESOPHAGOGASTRODUODENOSCOPY (EGD);  Surgeon: Lear Ng, MD;  Location: [facility];  Service: Endoscopy;  Laterality: N/A;  . ESOPHAGOGASTRODUODENOSCOPY N/A 04/07/2016   Procedure: ESOPHAGOGASTRODUODENOSCOPY (EGD);  Surgeon: Milus Banister, MD;  Location: [facility];  Service: Endoscopy;  Laterality: N/A;  . ESOPHAGOGASTRODUODENOSCOPY (EGD) WITH PROPOFOL Left 01/03/2016   Procedure:  ESOPHAGOGASTRODUODENOSCOPY (EGD) WITH PROPOFOL;  Surgeon: Arta Silence, MD;  Location: [facility] END[facility];  Service: Endoscopy;  Laterality: Left;  . FEMUR IM NAIL Left 04/15/2016   Procedure: INTRAMEDULLARY (IM) RETROGRADE FEMORAL NAILING;  Surgeon: Gaynelle Arabian, MD;  Location: [facility];  Service: Orthopedics;  Laterality: Left;  . HERNIA REPAIR  [id number]   supraumbilical repair  . KIDNEY SURGERY  12/2010   [facility]; partial nephrectomy  . LUMBAR LAMINECTOMY/DECOMPRESSION MICRODISCECTOMY Right 10/12/2014   Procedure: HEMI LAMINECTOMY MICRODISCECTOMY L5-S1 RIGHT (1 LEVEL);  Surgeon: Tobi Bastos, MD;  Location: [facility];  Service: Orthopedics;  Laterality: Right;  . MENISECTOMY  2010   left knee  . TUBAL LIGATION  44 years ago    Prior to Admission medications   Medication Sig Start Date End Date Taking? Authorizing Provider  albuterol (PROVENTIL HFA;VENTOLIN HFA) 108 (90 Base) MCG/ACT inhaler Inhale 2 puffs into the lungs every 6 (six) hours as needed for wheezing or shortness of breath. 08/05/16  Yes Golden Circle, FNP  docusate sodium (COLACE) 100 MG capsule Take 1 capsule (100 mg total) by mouth 2 (two) times daily. 06/14/16  Yes Golden Circle, FNP  DULoxetine (CYMBALTA) 60 MG capsule Take 1 capsule (60 mg total) by mouth daily. 08/05/16  Yes Golden Circle, FNP  famotidine (PEPCID) 20 MG tablet Take 1 tablet (20 mg total) by mouth 2 (two) times daily. 08/01/16  Yes Golden Circle, FNP  ferrous sulfate 325 (65 FE) MG tablet Take 1 tablet (325 mg total) by mouth 3 (three) times daily after meals. 04/18/16  Yes Shanker Kristeen Mans, MD  Fluticasone-Salmeterol (  ADVAIR DISKUS) 500-50 MCG/DOSE AEPB INHALE 1 PUFF INTO THE LUNGS TWICE A DAY 08/01/16  Yes Golden Circle, FNP  folic acid (FOLVITE) 1 MG tablet Take 1 tablet (1 mg total) by mouth daily. 04/18/16  Yes Shanker Kristeen Mans, MD  LORazepam (ATIVAN) 1 MG tablet Take 1 tablet (1 mg total) by mouth 2 (two) times daily as needed for anxiety (Anxiety,  shakiness. DO NOT USE WITH ALCOHOL.). 08/01/16  Yes Golden Circle, FNP  methocarbamol (ROBAXIN) 500 MG tablet Take 1 tablet (500 mg total) by mouth every 6 (six) hours as needed for muscle spasms. 08/01/16  Yes Golden Circle, FNP  montelukast (SINGULAIR) 10 MG tablet Take 1 tablet (10 mg total) by mouth at bedtime. 08/01/16  Yes Golden Circle, FNP  Multiple Vitamin (MULTIVITAMIN WITH MINERALS) TABS tablet Take 1 tablet by mouth daily. 04/18/16  Yes Shanker Kristeen Mans, MD  pantoprazole (PROTONIX) 40 MG tablet Take 1 tablet (40 mg total) by mouth 2 (two) times daily. 08/05/16  Yes Golden Circle, FNP  PARoxetine (PAXIL) 10 MG tablet Take 0.5 tablets (5 mg total) by mouth daily. 08/01/16  Yes Golden Circle, FNP  polyethylene glycol (MIRALAX / GLYCOLAX) packet Take 17 g by mouth daily. 08/01/16  Yes Golden Circle, FNP  potassium chloride SA (K-DUR,KLOR-CON) 20 MEQ tablet Take 1 tablet (20 mEq total) by mouth daily. Patient taking differently: Take 40 mEq by mouth daily.  08/05/16  Yes Golden Circle, FNP  simvastatin (ZOCOR) 40 MG tablet Take 1 tablet (40 mg total) by mouth every morning. 08/05/16  Yes Golden Circle, FNP  sucralfate (CARAFATE) 1 g tablet Take 1 tablet (1 g total) by mouth 4 (four) times daily -  with meals and at bedtime. 08/05/16  Yes Golden Circle, FNP  thiamine 100 MG tablet Take 1 tablet (100 mg total) by mouth daily. 04/18/16  Yes Shanker Kristeen Mans, MD  zolpidem (AMBIEN) 5 MG tablet Take 1 tablet (5 mg total) by mouth at bedtime as needed. for sleep 08/01/16  Yes Golden Circle, FNP    Current Facility-Administered Medications  Medication Dose Route Frequency Provider Last Rate Last Dose  . [MAR Hold] 0.9 %  sodium chloride infusion  10 mL/hr Intravenous Once Mercedes Camprubi-Soms, PA-C      . 0.9 %  sodium chloride infusion   Intravenous Continuous Arta Silence, MD      . Doug Sou Hold] albuterol (PROVENTIL) (2.5 MG/3ML) 0.083% nebulizer solution 3 mL  3 mL Inhalation  Q6H PRN Mariel Aloe, MD      . Doug Sou Hold] cefTRIAXone (ROCEPHIN) 2 g in dextrose 5 % 50 mL IVPB  2 g Intravenous Q24H Berton Mount, RPH   2 g at 10/26/16 0925  . [MAR Hold] DULoxetine (CYMBALTA) DR capsule 60 mg  60 mg Oral Daily Mariel Aloe, MD   60 mg at 10/26/16 [id number]  . [MAR Hold] folic acid (FOLVITE) tablet 1 mg  1 mg Oral Daily Mariel Aloe, MD   1 mg at 10/26/16 [id number]  . [MAR Hold] LORazepam (ATIVAN) injection 2-3 mg  2-3 mg Intravenous Q1H PRN Mariel Aloe, MD      . Doug Sou Hold] methocarbamol (ROBAXIN) tablet 500 mg  500 mg Oral Q6H PRN Mariel Aloe, MD      . Doug Sou Hold] mometasone-formoterol [facility]) 200-5 MCG/ACT inhaler 2 puff  2 puff Inhalation BID Mariel Aloe, MD   2 puff at 10/26/16 [phone]  . [  MAR Hold] montelukast (SINGULAIR) tablet 10 mg  10 mg Oral QHS Mariel Aloe, MD   10 mg at 10/25/16 2200  . [MAR Hold] morphine 2 MG/ML injection 2 mg  2 mg Intravenous Q4H PRN Mariel Aloe, MD   2 mg at 10/26/16 1118  . [MAR Hold] multivitamin with minerals tablet 1 tablet  1 tablet Oral Daily Mariel Aloe, MD   1 tablet at 10/26/16 [id number]  . octreotide (SANDOSTATIN) 500 mcg in sodium chloride 0.9 % 250 mL (2 mcg/mL) infusion  50 mcg/hr Intravenous Continuous Mercedes Camprubi-Soms, PA-C 25 mL/hr at 10/26/16 1000 50 mcg/hr at 10/26/16 1000  . pantoprazole (PROTONIX) 80 mg in sodium chloride 0.9 % 250 mL (0.32 mg/mL) infusion  8 mg/hr Intravenous Continuous Jola Schmidt, MD 25 mL/hr at 10/26/16 1132 8 mg/hr at 10/26/16 1132  . [MAR Hold] PARoxetine (PAXIL) tablet 5 mg  5 mg Oral Daily Mariel Aloe, MD   5 mg at 10/26/16 [id number]  . [MAR Hold] simvastatin (ZOCOR) tablet 40 mg  40 mg Oral q morning - 10a Mariel Aloe, MD   40 mg at 10/26/16 [id number]  . [MAR Hold] sodium chloride flush (NS) 0.9 % injection 3 mL  3 mL Intravenous Q12H Mariel Aloe, MD   3 mL at 10/26/16 1000  . [MAR Hold] thiamine (VITAMIN B-1) tablet 100 mg  100 mg Oral Daily Mariel Aloe, MD   100 mg at 10/26/16  [id number]  . [MAR Hold] zolpidem (AMBIEN) tablet 5 mg  5 mg Oral QHS PRN Mariel Aloe, MD   5 mg at 10/25/16 2052    Allergies as of 10/25/2016 - Review Complete 10/25/2016  Allergen Reaction Noted  . Azithromycin Itching and Swelling 10/17/2006  . Morphine and related Itching 04/06/2016    Family History  Problem Relation Age of Onset  . Hyperlipidemia Mother   . Hypertension Mother     Social History   Social History  . Marital status: Divorced    Spouse name: N/A  . Number of children: 3  . Years of education: 12   Occupational History  . Retired    Social History Main Topics  . Smoking status: Current Some Day Smoker    Packs/day: 0.15    Years: 6.00    Types: Cigarettes  . Smokeless tobacco: Never Used  . Alcohol use 1.2 oz/week    1 Glasses of wine, 1 Shots of liquor per week     Comment: socially last drink 04/10/16   . Drug use: No  . Sexual activity: Yes   Other Topics Concern  . Not on file   Social History Narrative   Fun: play cards, play with her grandchildren, cooking   Denies religious beliefs effecting health care.     Review of Systems: As per HPI, all others negative  Physical Exam: Vital signs in last 24 hours: Temp:  [97.5 F (36.4 C)-99.9 F (37.7 C)] 98.3 F (36.8 C) (12/16 1029) Pulse Rate:  [75-102] 90 (12/15 2322) Resp:  [13-25] 15 (12/16 1100) BP: (91-134)/(41-96) 128/63 (12/16 1100) SpO2:  [96 %-100 %] 100 % (12/16 1100) Weight:  [67.2 kg (148 lb 2.4 oz)] 67.2 kg (148 lb 2.4 oz) (12/15 1636) Last BM Date: 10/25/16 General:   Alert,  Well-developed, well-nourished, pleasant and cooperative in NAD Head:  Normocephalic and atraumatic. Eyes:  Sclera clear, no icterus.   Conjunctiva pale. Ears:  Normal auditory acuity. Nose:  No deformity, discharge,  or lesions.  Mouth:  No deformity or lesions.  Oropharynx mild pale and dry Neck:  Supple; no masses or thyromegaly. Lungs:  Clear throughout to auscultation.   No wheezes, crackles,  or rhonchi. No acute distress. Heart:  Regular rate and rhythm; no murmurs, clicks, rubs,  or gallops. Abdomen:  Soft, mild epigastric tenderness, and nondistended. No masses, hepatosplenomegaly or hernias noted. Normal bowel sounds, without guarding, and without rebound.     Msk:  Symmetrical without gross deformities. Normal posture. Pulses:  Normal pulses noted. Extremities:  Without clubbing or edema. Neurologic:  Alert and  oriented x4; diffusely weak, otherwise grossly normal neurologically. Skin:  Intact without significant lesions or rashes. Psych:  Alert and cooperative. Normal mood and affect.   Lab Results:  Recent Labs  10/25/16 1303 10/25/16 1410 10/26/16 0149  WBC 9.0 9.9 10.6*  HGB 4.8* 4.6* 7.3*  HCT 14.8* 14.1* 21.8*  PLT 267 254 235   BMET  Recent Labs  10/25/16 1303 10/26/16 0149  NA 133* 136  K 4.1 4.5  CL 100* 107  CO2 23 24  GLUCOSE 134* 126*  BUN 40* 24*  CREATININE 0.87 0.76  CALCIUM 7.9* 7.8*   LFT  Recent Labs  10/26/16 0149  PROT 5.6*  ALBUMIN 2.7*  AST 28  ALT 12*  ALKPHOS 325*  BILITOT 0.9   PT/INR  Recent Labs  10/25/16 1303  LABPROT 13.8  INR 1.05    Studies/Results: No results found.  Impression:  1.  Hematemesis and melena. 2.  Acute blood loss anemia. 3.  Alcohol abuse.  4.  Cirrhosis by imaging.  Plan:  1.  PPI, octreotide, blood transfusion, volume repletion. 2.  Endoscopy today for further evaluation. 3.  Risks (bleeding, infection, bowel perforation that could require surgery, sedation-related changes in cardiopulmonary systems), benefits (identification and possible treatment of source of symptoms, exclusion of certain causes of symptoms), and alternatives (watchful waiting, radiographic imaging studies, empiric medical treatment) of upper endoscopy (EGD) were explained to patient/family in detail and patient wishes to proceed. 4.  [facility] will follow.   LOS: 1 day   Malynn Lucy M  10/26/2016,  12:15 PM  Pager [phone] If no answer or after 5 PM call [phone]

## 2016-10-26 NOTE — Progress Notes (Signed)
RN discussed with pt. About drinking. Pt. Stated she drinks "three shots of liquor a day or more" and she has been drinking her whole life.

## 2016-10-26 NOTE — Interval H&P Note (Signed)
History and Physical Interval Note:  10/26/2016 12:13 PM  [name]  has presented today for surgery, with the diagnosis of melena, coffee ground emesis, anemia, history of gastric ulcers  The various methods of treatment have been discussed with the patient and family. After consideration of risks, benefits and other options for treatment, the patient has consented to  Procedure(s): ESOPHAGOGASTRODUODENOSCOPY (EGD) (N/A) as a surgical intervention .  The patient's history has been reviewed, patient examined, no change in status, stable for surgery.  I have reviewed the patient's chart and labs.  Questions were answered to the patient's satisfaction.     Landry Dyke

## 2016-10-26 NOTE — Op Note (Signed)
[facility] Patient Name: [name] Procedure Date: 10/26/2016 MRN: [id number] Attending MD: Arta Silence , MD Date of Birth: [birth year] CSN: [id number] Age: 70 Admit Type: Inpatient Procedure:                Upper GI endoscopy Indications:              Hematemesis, Melena, cirrhosis Providers:                Arta Silence, MD, Hilma Favors, RN, Corliss Parish, Technician Referring MD:              Medicines:                Fentanyl 50 micrograms IV, Midazolam 4 mg IV,                            Cetacaine spray Complications:            No immediate complications. Estimated Blood Loss:     Estimated blood loss was minimal. Procedure:                Pre-Anesthesia Assessment:                           - Prior to the procedure, a History and Physical                            was performed, and patient medications and                            allergies were reviewed. The patient's tolerance of                            previous anesthesia was also reviewed. The risks                            and benefits of the procedure and the sedation                            options and risks were discussed with the patient.                            All questions were answered, and informed consent                            was obtained. Prior Anticoagulants: The patient has                            taken no previous anticoagulant or antiplatelet                            agents. ASA Grade Assessment: III - A patient with  severe systemic disease. After reviewing the risks                            and benefits, the patient was deemed in                            satisfactory condition to undergo the procedure.                           After obtaining informed consent, the endoscope was                            passed under direct vision. Throughout the                            procedure, the patient's  blood pressure, pulse, and                            oxygen saturations were monitored continuously. The                            [id number] scope was introduced through the                            mouth, and advanced to the second part of duodenum.                            The upper GI endoscopy was accomplished without                            difficulty. The patient tolerated the procedure                            well. Scope In: Scope Out: Findings:      One moderate (circumferential scarring or stenosis; an endoscope may       pass) benign-appearing, intrinsic stenosis was found in very proximal       esophagus, consistent with esophageal web.      One mild benign-appearing, intrinsic stenosis was found at GE junction,       consistent with peptic stricture.      Grade I varices were found in the middle third of the esophagus and in       the lower third of the esophagus. Area looked a bit fibrotic and       friable. Varices not large enough for banding.      LA Grade A (one or more mucosal breaks less than 5 mm, not extending       between tops of 2 mucosal folds) esophagitis was found.      The exam of the esophagus was otherwise normal.      Prominent fold versus small gastric varix. Suspicion of gastric varix       raised due to solitary small area of red blood adherent to the area, but       which washed off with lavage, without other pinpoint explanation for       bleeding. There were  no stigmata of recent bleeding. Estimated blood       loss was minimal.      Moderate portal hypertensive gastropathy was found in the entire       examined stomach.      The exam of the stomach was otherwise normal. Previously noted gastric       ulcers have subsequently entirely healed.      The duodenal bulb, first portion of the duodenum and second portion of       the duodenum were normal. Other than the scant amount of red blood in       proximal esophagus (as  discussed above), there was no other blood to the       extent of our examination. 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                           - Grade I esophageal varices.                           - LA Grade A esophagitis.                           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                           - Portal hypertensive gastropathy.                           - Normal duodenal bulb, first portion of the                            duodenum and second portion of the duodenum.                           - Suspect bleeding either from gastric dieulafoy or                            from gastric varix. No ongoing bleeding at this                            time. Moderate Sedation:      Moderate (conscious) sedation was administered by the endoscopy nurse       and supervised by the endoscopist. The following parameters were  monitored: oxygen saturation, heart rate, blood pressure, and response       to care. Recommendation:           - Return patient to hospital ward for ongoing care.                           - Clear liquid diet today.                           - Continue present medications (including                            pantoprazole drip, octreotide drip).                           [facility] will follow. Procedure Code(s):        --- Professional ---                           [phone], Esophagogastroduodenoscopy, flexible,                            transoral; diagnostic, including collection of                            specimen(s) by brushing or washing, when performed                            (separate procedure) Diagnosis Code(s):        --- Professional  ---                           K22.2, Esophageal obstruction                           I85.00, Esophageal varices without bleeding                           K20.9, Esophagitis, unspecified                           I86.4, Gastric varices                           K76.6, Portal hypertension                           K31.89, Other diseases of stomach and duodenum                           K92.0, Hematemesis                           K92.1, Melena (includes Hematochezia) CPT copyright 2016 American Medical Association. All rights reserved. The codes documented in this report are preliminary and upon coder review may  be revised to meet current compliance requirements. Arta Silence, MD 10/26/2016 12:34:20 PM This report has been signed electronically. Number of Addenda: 0

## 2016-10-27 ENCOUNTER — Inpatient Hospital Stay (HOSPITAL_COMMUNITY): Payer: Medicare HMO

## 2016-10-27 LAB — BASIC METABOLIC PANEL
Anion gap: 6 (ref 5–15)
BUN: 7 mg/dL (ref 6–20)
CO2: 22 mmol/L (ref 22–32)
CREATININE: 0.58 mg/dL (ref 0.44–1.00)
Calcium: 8.4 mg/dL — ABNORMAL LOW (ref 8.9–10.3)
Chloride: 107 mmol/L (ref 101–111)
GFR calc Af Amer: >60 mL/min (ref >60)
GFR calc non Af Amer: >60 mL/min (ref >60)
GLUCOSE: 113 mg/dL — AB (ref 65–99)
Potassium: 4.9 mmol/L (ref 3.5–5.1)
SODIUM: 135 mmol/L (ref 135–145)

## 2016-10-27 MED ORDER — PANTOPRAZOLE SODIUM 40 MG PO TBEC: 40.0000 mg | DELAYED_RELEASE_TABLET | Freq: Every day | ORAL | 0 refills | Status: DC

## 2016-10-27 MED ORDER — PANTOPRAZOLE SODIUM 40 MG PO TBEC
40.0000 mg | DELAYED_RELEASE_TABLET | Freq: Every day | ORAL | Status: DC
  Administered 2016-10-27: 40 mg via ORAL
  Filled 2016-10-27: qty 1

## 2016-10-27 MED ORDER — FERROUS SULFATE 325 (65 FE) MG PO TABS: 325.0000 mg | ORAL_TABLET | Freq: Two times a day (BID) | ORAL | 0 refills | Status: DC

## 2016-10-27 MED ORDER — PROPRANOLOL HCL 10 MG PO TABS: 10.0000 mg | ORAL_TABLET | Freq: Two times a day (BID) | ORAL | 0 refills | Status: DC

## 2016-10-27 MED ORDER — PROPRANOLOL HCL 10 MG PO TABS
10.0000 mg | ORAL_TABLET | Freq: Two times a day (BID) | ORAL | Status: DC
  Administered 2016-10-27: 10 mg via ORAL
  Filled 2016-10-27: qty 1

## 2016-10-27 MED ORDER — ACETAMINOPHEN 325 MG PO TABS
650.0000 mg | ORAL_TABLET | Freq: Four times a day (QID) | ORAL | Status: DC | PRN
  Administered 2016-10-27: 650 mg via ORAL
  Filled 2016-10-27: qty 2

## 2016-10-27 NOTE — Discharge Instructions (Signed)
Propranolol oral solution What is this medicine? PROPRANOLOL (proe PRAN oh lole) is a beta-blocker. This medicine can be used to treat high blood pressure, to slow fast heart rate, and to relieve chest pain caused by angina. It is also used to treat infantile hemangioma, relieve uncontrollable shaking (tremors), and help certain problems related to the thyroid gland and adrenal gland. This medicine is also used to prevent migraine headaches. This medicine may be used for other purposes; ask your health care provider or pharmacist if you have questions. COMMON BRAND NAME(S): HEMANGEOL What should I tell my health care provider before I take this medicine? They need to know if you have any of these conditions: -circulation problems in fingers and toes -diabetes -poor appetite or feeding problems (infants) -heart disease -kidney disease -liver disease -low blood pressure -lung or breathing disease, like asthma -PHACE syndrome -pheochromocytoma -slow heart rate -thyroid disease -an unusual or allergic reaction to propranolol, other beta-blockers, medicines, foods, dyes, or preservatives -pregnant or trying to get pregnant -breast-feeding How should I use this medicine? Take this medicine by mouth. Follow the directions on the prescription label. Use a specially marked spoon or container to measure each dose. Ask your pharmacist if you do not have one. Household spoons are not accurate. This medicine can be taken with or without food; small children should take this medicine with a feeding or right after a feeding. Take your medicine at regular intervals. Do not take it more often than directed. Do not stop taking except on your doctor's advice. Talk to your pediatrician regarding the use of this medicine in children. While this drug may be prescribed in children as young as 5 weeks for selected conditions, precautions do apply. Overdosage: If you think you have taken too much of this medicine  contact a poison control center or emergency room at once. NOTE: This medicine is only for you. Do not share this medicine with others. What if I miss a dose? If you miss a dose, take it as soon as you can. If it is almost time for your next dose, take only that dose. Do not take double or extra doses. What may interact with this medicine? Do not take this medicine with any of the following medications: -feverfew -phenothiazines like chlorpromazine, mesoridazine, prochlorperazine, thioridazine This medicine may also interact with the following medications: -aluminum hydroxide gel -antipyrine -antiviral medicines for HIV or AIDS -barbiturates like phenobarbital -certain medicines for blood pressure, heart disease, irregular heart beat -cimetidine -ciprofloxacin -diazepam -fluconazole -haloperidol -isoniazid -medicines for cholesterol like cholestyramine or colestipol -medicines for depression, anxiety, or psychotic disturbances -medicines for migraine headache like almotriptan, eletriptan, frovatriptan, naratriptan, rizatriptan, sumatriptan, zolmitriptan -NSAIDs, medicines for pain and inflammation, like ibuprofen or naproxen -phenytoin -rifampin -steroid medicines like prednisone or cortisone -teniposide -theophylline -thyroid medicines -tolbutamide -warfarin -zileuton This list may not describe all possible interactions. Give your health care provider a list of all the medicines, herbs, non-prescription drugs, or dietary supplements you use. Also tell them if you smoke, drink alcohol, or use illegal drugs. Some items may interact with your medicine. What should I watch for while using this medicine? Visit your doctor or health care professional for regular checks on your progress. Check your blood pressure and pulse rate as directed. Ask your doctor or health care professional what your blood pressure and pulse rate should be, and when you should contact him or her. You may get  drowsy or dizzy. Do not drive, use machinery, or do anything that  needs mental alertness until you know how this drug affects you. Do not stand or sit up quickly, especially if you are an older patient. This reduces the risk of dizzy or fainting spells. Avoid alcoholic drinks; they can make you more dizzy. This medicine may affect blood sugar levels. If you have diabetes, check with your doctor or health care professional before you change your diet or the dose of your diabetic medicine. Learn the symptoms of low blood sugar. Do not give this medicine to small children if they are not feeding regularly or are vomiting. Do not treat yourself for coughs, colds, or pain while you are taking this medicine without asking your doctor or health care professional for advice. Some ingredients may increase your blood pressure. What side effects may I notice from receiving this medicine? Side effects that you should report to your doctor or health care professional as soon as possible: -allergic reactions like skin rash, itching or hives, swelling of the face, lips, or tongue -breathing problems -cold hands or feet -hallucinations -muscle cramps or weakness -signs and symptoms of low blood sugar such as feeling anxious; confusion; dizziness; increased hunger; unusually weak or tired; sweating; shakiness; cold; irritable; headache; blurred vision; fast heartbeat; loss of consciousness -signs and symptoms of low blood pressure like dizziness; feeling faint or lightheaded, falls; unusually weak or tired -slow heart rate -swelling of the legs and ankles -trouble sleeping or nightmares -vomiting Side effects that usually do not require medical attention (report to your doctor or health care professional if they continue or are bothersome): -change in sex drive or performance -changes in sleep patterns (infants) -diarrhea -dry, sore eyes -hair loss -nausea -weak or tired This list may not describe all  possible side effects. Call your doctor for medical advice about side effects. You may report side effects to FDA at 1-800-FDA-1088. Where should I keep my medicine? Keep out of the reach of children. Store at room temperature between 15 and 30 degrees C (59 and 86 degrees F). Do not freeze. Throw away any unused medicine after the expiration date. NOTE: This sheet is a summary. It may not cover all possible information. If you have questions about this medicine, talk to your doctor, pharmacist, or health care provider.  2017 Elsevier/Gold Standard (2013-06-25 10:09:00) Alcohol Use Disorder Alcohol use disorder is when your drinking disrupts your daily life. When you have this condition, you drink too much alcohol and you cannot control your drinking. Alcohol use disorder can cause serious problems with your physical health. It can affect your brain, heart, liver, pancreas, immune system, stomach, and intestines. Alcohol use disorder can increase your risk for certain cancers and cause problems with your mental health, such as depression, anxiety, psychosis, delirium, and dementia. People with this disorder risk hurting themselves and others. What are the causes? This condition is caused by drinking too much alcohol over time. It is not caused by drinking too much alcohol only one or two times. Some people with this condition drink alcohol to cope with or escape from negative life events. Others drink to relieve pain or symptoms of mental illness. What increases the risk? You are more likely to develop this condition if:  You have a family history of alcohol use disorder.  Your culture encourages drinking to the point of intoxication, or makes alcohol easy to get.  You had a mood or conduct disorder in childhood.  You have been a victim of abuse.  You are an adolescent and:  You have poor grades or difficulties in school.  Your caregivers do not talk to you about saying no to alcohol, or  supervise your activities.  You are impulsive or you have trouble with self-control. What are the signs or symptoms? Symptoms of this condition include:  Drinkingmore than you want to.  Drinking for longer than you want to.  Trying several times to drink less or to control your drinking.  Spending a lot of time getting alcohol, drinking, or recovering from drinking.  Craving alcohol.  Having problems at work, at school, or at home due to drinking.  Having problems in relationships due to drinking.  Drinking when it is dangerous to drink, such as before driving a car.  Continuing to drink even though you know you might have a physical or mental problem related to drinking.  Needing more and more alcohol to get the same effect you want from the alcohol (building up tolerance).  Having symptoms of withdrawal when you stop drinking. Symptoms of withdrawal include:  Fatigue.  Nightmares.  Trouble sleeping.  Depression.  Anxiety.  Fever.  Seizures.  Severe confusion.  Feeling or seeing things that are not there (hallucinations).  Tremors.  Rapid heart rate.  Rapid breathing.  High blood pressure.  Drinking to avoid symptoms of withdrawal. How is this diagnosed? This condition is diagnosed with an assessment. Your health care provider may start the assessment by asking three or four questions about your drinking. Your health care provider may perform a physical exam or do lab tests to see if you have physical problems resulting from alcohol use. She or he may refer you to a mental health professional for evaluation. How is this treated? Some people with alcohol use disorder are able to reduce their alcohol use to low-risk levels. Others need to completely quit drinking alcohol. When necessary, mental health professionals with specialized training in substance use treatment can help. Your health care provider can help you decide how severe your alcohol use disorder  is and what type of treatment you need. The following forms of treatment are available:  Detoxification. Detoxification involves quitting drinking and using prescription medicines within the first week to help lessen withdrawal symptoms. This treatment is important for people who have had withdrawal symptoms before and for heavy drinkers who are likely to have withdrawal symptoms. Alcohol withdrawal can be dangerous, and in severe cases, it can cause death. Detoxification may be provided in a home, community, or primary care setting, or in a hospital or substance use treatment facility.  Counseling. This treatment is also called talk therapy. It is provided by substance use treatment counselors. A counselor can address the reasons you use alcohol and suggest ways to keep you from drinking again or to prevent problem drinking. The goals of talk therapy are to:  Find healthy activities and ways for you to cope with stress.  Identify and avoid the things that trigger your alcohol use.  Help you learn how to handle cravings.  Medicines.Medicines can help treat alcohol use disorder by:  Decreasing alcohol cravings.  Decreasing the positive feeling you have when you drink alcohol.  Causing an uncomfortable physical reaction when you drink alcohol (aversion therapy).  Support groups. Support groups are led by people who have quit drinking. They provide emotional support, advice, and guidance. These forms of treatment are often combined. Some people with this condition benefit from a combination of treatments provided by specialized substance use treatment centers. Follow these instructions at home:  Take over-the-counter and prescription medicines only as told by your health care provider.  Check with your health care provider before starting any new medicines.  Ask friends and family members not to offer you alcohol.  Avoid situations where alcohol is served, including gatherings where  others are drinking alcohol.  Create a plan for what to do when you are tempted to use alcohol.  Find hobbies or activities that you enjoy that do not include alcohol.  Keep all follow-up visits as told by your health care provider. This is important. How is this prevented?  If you drink, limit alcohol intake to no more than 1 drink a day for nonpregnant women and 2 drinks a day for men. One drink equals 12 oz of beer, 5 oz of wine, or 1 oz of hard liquor.  If you have a mental health condition, get treatment and support.  Do not give alcohol to adolescents.  If you are an adolescent:  Do not drink alcohol.  Do not be afraid to say no if someone offers you alcohol. Speak up about why you do not want to drink. You can be a positive role model for your friends and set a good example for those around you by not drinking alcohol.  If your friends drink, spend time with others who do not drink alcohol. Make new friends who do not use alcohol.  Find healthy ways to manage stress and emotions, such as meditation or deep breathing, exercise, spending time in nature, listening to music, or talking with a trusted friend or family member. Contact a health care provider if:  You are not able to take your medicines as told.  Your symptoms get worse.  You return to drinking alcohol (relapse) and your symptoms get worse. Get help right away if:  You have thoughts about hurting yourself or others. If you ever feel like you may hurt yourself or others, or have thoughts about taking your own life, get help right away. You can go to your nearest emergency department or call:  Your local emergency services (911 in the [place]  A suicide crisis helpline, such as the [place] at 719 620 4734. This is open 24 hours a day. Summary  Alcohol use disorder is when your drinking disrupts your daily life. When you have this condition, you drink too much alcohol and you cannot  control your drinking.  Treatment may include detoxification, counseling, medicine, and support groups.  Ask friends and family members not to offer you alcohol. Avoid situations where alcohol is served.  Get help right away if you have thoughts about hurting yourself or others. This information is not intended to replace advice given to you by your health care provider. Make sure you discuss any questions you have with your health care provider. Document Released: 12/05/2004 Document Revised: 07/25/2016 Document Reviewed: 07/25/2016 Elsevier Interactive Patient Education  2017 [employer].

## 2016-10-27 NOTE — Progress Notes (Signed)
   10/27/16 0210  What Happened  Was fall witnessed? Yes  Who witnessed fall? Elwin Sleight RN   Patients activity before fall bathroom-unassisted  Point of contact buttocks  Was patient injured? No  Follow Up  MD notified Hal Hope   Time MD notified [place] notified No- patient refusal  Additional tests Yes-comment (portable pelvic xray)  Progress note created (see row info) Yes  Adult Fall Risk Assessment  Risk Factor Category (scoring not indicated) Fall has occurred during this admission (document High fall risk)  Patient's Fall Risk High Fall Risk (>13 points)  Adult Fall Risk Interventions  Required Bundle Interventions *See Row Information* High fall risk - low, moderate, and high requirements implemented  Additional Interventions Individualized elimination schedule;Use of appropriate toileting equipment (bedpan, BSC, etc.);Other (Comment) (bedalarm, grip socks, frequent rounding )  Screening for Fall Injury Risk  Risk For Fall Injury- See Row Information  Nurse judgement  Vitals  Temp 98.1 F (36.7 C)  Temp Source Oral  BP (!) 124/59  BP Location Left Arm  BP Method Automatic  Patient Position (if appropriate) Sitting  Pulse Rate 87  Pulse Rate Source Dinamap  Resp 16  Oxygen Therapy  SpO2 99 %  O2 Device Room Air  Pain Assessment  Pain Assessment No/denies pain  Pain Score 0  PCA/Epidural/Spinal Assessment  Respiratory Pattern Regular;Unlabored  Neurological  Neuro (WDL) WDL  Level of Consciousness Alert  Orientation Level Oriented X4  Cognition Appropriate at baseline;Poor judgement;Impulsive  Musculoskeletal  Musculoskeletal (WDL) WDL  Assistive Device Cane  Integumentary  Integumentary (WDL) WDL

## 2016-10-27 NOTE — Care Management Note (Signed)
Case Management Note  Patient Details  Name: [name] MRN: [id number] Date of Birth: [birth year]  Subjective/Objective:    UGIB                Action/Plan: Discharge Planning: AVS reviewed: NCM spoke to pt and states she had [place] in the past, but not sure of agency. Will give NCM a call when she gets home. Requesting RW for home. Contacted [facility] DME rep for RW.  Received call from dtr, and [facility] agency was [facility]. [place] Liaison with new referral.   Expected Discharge Date:  10/27/2016            Expected Discharge Plan:  [place]  In-House Referral:  NA  Discharge planning Services  CM Consult  Post Acute Care Choice:  Home Health Choice offered to:  Patient  DME Arranged:  [place] [facility] Agency:  [place] Arranged:  RN, PT, OT [facility] Agency:  [place]  Status of Service:  Completed, signed off  If discussed at [place] of Stay Meetings, dates discussed:    Additional Comments:  Erenest Rasher, RN 10/27/2016, 4:06 PM

## 2016-10-27 NOTE — Progress Notes (Signed)
Subjective: No abdominal pain. No further hematemesis or blood in stool.  Objective: Vital signs in last 24 hours: Temp:  [98 F (36.7 C)-98.5 F (36.9 C)] 98.5 F (36.9 C) (12/17 0614) Pulse Rate:  [60-92] 79 (12/17 0614) Resp:  [16-21] 18 (12/17 0614) BP: (106-145)/(52-90) 145/75 (12/17 0614) SpO2:  [98 %-100 %] 99 % (12/17 0744) Weight change:  Last BM Date: 10/26/16  PE: GEN:   NAD, pleasant ABD:  Soft, non-tender  Lab Results: CBC    Component Value Date/Time   WBC 8.7 10/27/2016 0530   RBC 3.66 (L) 10/27/2016 0530   HGB 10.8 (L) 10/27/2016 0530   HCT 32.6 (L) 10/27/2016 0530   PLT 230 10/27/2016 0530   MCV 89.1 10/27/2016 0530   MCH 29.5 10/27/2016 0530   MCHC 33.1 10/27/2016 0530   RDW 17.5 (H) 10/27/2016 0530   LYMPHSABS 2.7 04/13/2016 0402   MONOABS 1.0 04/13/2016 0402   EOSABS 0.3 04/13/2016 0402   BASOSABS 0.0 04/13/2016 0402   CMP     Component Value Date/Time   NA 135 10/27/2016 0530   K 4.9 10/27/2016 0530   CL 107 10/27/2016 0530   CO2 22 10/27/2016 0530   GLUCOSE 113 (H) 10/27/2016 0530   BUN 7 10/27/2016 0530   CREATININE 0.58 10/27/2016 0530   CALCIUM 8.4 (L) 10/27/2016 0530   PROT 5.6 (L) 10/26/2016 0149   ALBUMIN 2.7 (L) 10/26/2016 0149   AST 28 10/26/2016 0149   ALT 12 (L) 10/26/2016 0149   ALKPHOS 325 (H) 10/26/2016 0149   BILITOT 0.9 10/26/2016 0149   GFRNONAA >60 10/27/2016 0530   GFRAA >60 10/27/2016 0530    Assessment:  1.  Hematemesis and melena.  Likely multifactorial (portal HTN gastropathy as well as either gastric varix or gastric dieulafoy). 2.  Gastric and esophageal varices, on imaging and endoscopy.  Varices too small to band at time of yesterday's endoscopy. 3.  History of gastric ulcers, healed at time of yesterday's endoscopy. 4.  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 5.  Anemia, acute blood loss, likely from #1 and #2 above. 6.  Alcohol abuse. 7.  Elevated ALP, likely from #4 and #6 above.  Plan:  1.  Strict  alcohol cessation. 2.  Stop octreotide and start propranolol 10 mg po bid (now and upon discharge). 3.  Pantoprazole 40 mg po qd, now and upon discharge. 4.  Advance diet as tolerated. 5.  OK to discharge home today from GI perspective. 6.  Will need outpatient follow-up with me (Dr. Paulita Fujita, [facility], [phone]) in 2-3 weeks, and will likely need repeat endoscopy for possible esophageal variceal banding in 4-6 weeks. 7.  [facility] will sign-off; please call with questions; thank you for the consultation.   Landry Dyke 10/27/2016, 12:39 PM   Pager [phone] If no answer or after 5 PM call [phone]

## 2016-10-27 NOTE — Discharge Summary (Signed)
Physician Discharge Summary  [name] [id number] DOB: 04-13-1946 DOA: 10/25/2016  PCP: No primary care provider on file.  Admit date: 10/25/2016 Discharge date: 10/27/2016  Admitted From: Home  Disposition:  home   Recommendations for Outpatient Follow-up:  1. Follow up with Dr. Paulita Fujita in 1 week 2. Change Protonix to 40 mg once daily 3. Added propranolol 10 mg by mouth twice a day 4. Encouraged her to stop alcohol completely  Home Health:  PT, OT, RN Equipment/Devices:  Already has walker at home  Discharge Condition:  Stable, improved CODE STATUS:  Full code  Diet recommendation:  Regular diet   Brief/Interim Summary:  [name] a 70 y.o.femalewith medical history significant of alcohol abuse, depression, anemia, esophageal varices, gastric ulcers with recurrent hemorrhage, hyperlipidemia, insomnia, tobacco abuse who presented with melena and coffee-ground emesis.  She denied recent NSAID use but did start drinking alcohol a few days prior to when her symptoms started.  She had some dizziness that was slightly worse than baseline, but denied shortness of breath. Her hemoglobin in the emergency department was 4.6 g/dL. She was transfused a total of 4 units PRBCs. She was seen by gastroenterology and underwent EGD on 10/26/2016.  She had some benign appearing esophageal stenosis, likely an esophageal lab and a peptic stricture. She had grade 1 esophageal varices and L a grade a esophagitis. There was a possible gastric varix with a small amount of blood but no ongoing bleeding. There is evidence of portal hypertensive gastropathy. Gastric ulcers that were previously seen were resolved.  Gastroenterologist Dr. Paulita Fujita felt that she bled either from a gastric varix or a Dieulafoy lesion. She did not have any further bloody emesis or bloody stool after she came to the hospital. Her hemoglobin responded appropriately to blood transfusions. By the time of discharge, she was able to  tolerate a regular diet. She was advised to return to the emergency department if she notices recurrent hematemesis on melena, or bloody stools.  She was strongly encouraged to stop drinking alcohol and told to avoid NSAIDs.  Discharge Diagnoses:  Active Problems:   Hyperlipidemia   Anemia of chronic disease   Insomnia   Alcohol abuse   Tobacco abuse   Depression   Acute upper GI bleed   Esophageal varices (HCC)  Acute GI bleed,  likely due to gastric varix or Dieulafoy lesion, resolved spontaneously.  - Initially started on Octreotide and Protonix infusions -  Given ceftriaxone for prophylaxis due to GI bleed in setting of cirrhosis -  Transfused 4 units pack red blood cells -  Because her ulcers had healed, her Protonix was reduced to 40 mg once daily -  She was started on propranolol 10 mg by mouth twice a day  Esophageal varices, EtOH Cirrhosis -  Not on diuretics currently and appears euvolemic  Anemia of chronic disease with superimposed acute blood loss anemia -  Recommended increasing iron to twice a day (had only been taking once daily at home prior to admission)  Alcohol abuse, started on thiamine, folate, multivitamin. Her CIWA scores ranged from 0-10.  Tobacco abuse, Discussed cessation -Patient not interested in nicotine patch  Insomnia -Continued Ambien daily at bedtime when necessary  Depression -Continued home Paxil and Cymbalta  Fall in the bathroom.  Hip XR without fracture.  PT recommended home health services.    Discharge Instructions  Discharge Instructions    Call MD for:  difficulty breathing, headache or visual disturbances    Complete by:  As directed    Call MD for:  extreme fatigue    Complete by:  As directed    Call MD for:  hives    Complete by:  As directed    Call MD for:  persistant dizziness or light-headedness    Complete by:  As directed    Call MD for:  persistant nausea and vomiting    Complete by:  As directed    Call  MD for:  severe uncontrolled pain    Complete by:  As directed    Call MD for:  temperature >100.4    Complete by:  As directed    Diet - low sodium heart healthy    Complete by:  As directed    Discharge instructions    Complete by:  As directed    Please STOP drinking alcohol and do NOT take any NSAIDS (ibuprofen, naprosyn, aspirin, goody's powders).  The gastroenterologist has recommended that you start taking protonix once daily instead of twice daily.  He would also like you to start taking a medication called propranolol twice a day to help your liver work better and possibly reduce the risk of bleeding.   Increase activity slowly    Complete by:  As directed        Medication List    STOP taking these medications   docusate sodium 100 MG capsule Commonly known as:  COLACE   famotidine 20 MG tablet Commonly known as:  PEPCID   methocarbamol 500 MG tablet Commonly known as:  ROBAXIN   sucralfate 1 g tablet Commonly known as:  CARAFATE     TAKE these medications   albuterol 108 (90 Base) MCG/ACT inhaler Commonly known as:  PROVENTIL HFA;VENTOLIN HFA Inhale 2 puffs into the lungs every 6 (six) hours as needed for wheezing or shortness of breath.   DULoxetine 60 MG capsule Commonly known as:  CYMBALTA Take 1 capsule (60 mg total) by mouth daily.   ferrous sulfate 325 (65 FE) MG tablet Take 1 tablet (325 mg total) by mouth 2 (two) times daily with a meal. What changed:  when to take this   Fluticasone-Salmeterol 500-50 MCG/DOSE Aepb Commonly known as:  ADVAIR DISKUS INHALE 1 PUFF INTO THE LUNGS TWICE A DAY   folic acid 1 MG tablet Commonly known as:  FOLVITE Take 1 tablet (1 mg total) by mouth daily.   LORazepam 1 MG tablet Commonly known as:  ATIVAN Take 1 tablet (1 mg total) by mouth 2 (two) times daily as needed for anxiety (Anxiety, shakiness. DO NOT USE WITH ALCOHOL.).   montelukast 10 MG tablet Commonly known as:  SINGULAIR Take 1 tablet (10 mg total)  by mouth at bedtime.   multivitamin with minerals Tabs tablet Take 1 tablet by mouth daily.   pantoprazole 40 MG tablet Commonly known as:  PROTONIX Take 1 tablet (40 mg total) by mouth daily. What changed:  when to take this   PARoxetine 10 MG tablet Commonly known as:  PAXIL Take 0.5 tablets (5 mg total) by mouth daily.   polyethylene glycol packet Commonly known as:  MIRALAX / GLYCOLAX Take 17 g by mouth daily.   potassium chloride SA 20 MEQ tablet Commonly known as:  K-DUR,KLOR-CON Take 1 tablet (20 mEq total) by mouth daily. What changed:  how much to take   propranolol 10 MG tablet Commonly known as:  INDERAL Take 1 tablet (10 mg total) by mouth 2 (two) times daily.   simvastatin 40 MG  tablet Commonly known as:  ZOCOR Take 1 tablet (40 mg total) by mouth every morning.   thiamine 100 MG tablet Take 1 tablet (100 mg total) by mouth daily.   zolpidem 5 MG tablet Commonly known as:  AMBIEN Take 1 tablet (5 mg total) by mouth at bedtime as needed. for sleep      Follow-up Information    Landry Dyke, MD. Schedule an appointment as soon as possible for a visit in 1 week(s).   Specialty:  Gastroenterology Contact information: [id number] [place] [address] [phone]          Allergies  Allergen Reactions  . Azithromycin Itching and Swelling  . Morphine And Related Itching    Consultations: [facility]   Procedures/Studies: Dg Pelvis Portable  Result Date: 10/27/2016 CLINICAL DATA:  70 year old female with fall and left hip pain. EXAM: PORTABLE PELVIS 1-2 VIEWS COMPARISON:  CT of the abdomen pelvis dated 04/06/2016 FINDINGS: Partially visualized left femoral intramedullary rod. No acute fracture noted. There is no dislocation. The bones are osteopenic. There is degenerative changes of the lower lumbar spine. Multiple pelvic phleboliths noted. IMPRESSION: No acute fracture or dislocation. Electronically Signed   By:  Anner Crete M.D.   On: 10/27/2016 07:14    EGD on 10/26/2016   Subjective:  Did not sleep last night and feels tired this morning.  Just wants to be left alone.  Has not had any BMs or bloody emesis since admission.  Had a witnessed fall in the bathroom   Discharge Exam: Vitals:   10/27/16 0210 10/27/16 0614  BP: (!) 124/59 (!) 145/75  Pulse: 87 79  Resp: 16 18  Temp: 98.1 F (36.7 C) 98.5 F (36.9 C)   Vitals:   10/26/16 2107 10/27/16 0210 10/27/16 0614 10/27/16 0744  BP: 136/71 (!) 124/59 (!) 145/75   Pulse: 88 87 79   Resp: 18 16 18    Temp: 98 F (36.7 C) 98.1 F (36.7 C) 98.5 F (36.9 C)   TempSrc: Oral Oral Oral   SpO2: 99% 99% 98% 99%  Weight:      Height:        General exam: Thin adult female.  No acute distress.  Sleeping but arouseable HEENT:  NCAT, MMM Respiratory system: Clear to auscultation bilaterally Cardiovascular system: Regular rate and rhythm, normal S1/S2. No murmurs, rubs, gallops or clicks.  Warm extremities Gastrointestinal system: Normal active bowel sounds, soft, nondistended, nontender. MSK:  Normal tone and bulk, no lower extremity edema Neuro:  Grossly intact   The results of significant diagnostics from this hospitalization (including imaging, microbiology, ancillary and laboratory) are listed below for reference.     Microbiology: Recent Results (from the past 240 hour(s))  MRSA PCR Screening     Status: None   Collection Time: 10/25/16  4:55 PM  Result Value Ref Range Status   MRSA by PCR NEGATIVE NEGATIVE Final    Comment:        The GeneXpert MRSA Assay (FDA approved for NASAL specimens only), is one component of a comprehensive MRSA colonization surveillance program. It is not intended to diagnose MRSA infection nor to guide or monitor treatment for MRSA infections.      Labs: BNP (last 3 results) No results for input(s): BNP in the last 8760 hours. Basic Metabolic Panel:  Recent Labs Lab 10/25/16 1303  10/26/16 0149 10/27/16 0530  NA 133* 136 135  K 4.1 4.5 4.9  CL 100* 107 107  CO2 23 24 22   GLUCOSE 134* 126* 113*  BUN 40* 24* 7  CREATININE 0.87 0.76 0.58  CALCIUM 7.9* 7.8* 8.4*  MG 1.7  --   --    Liver Function Tests:  Recent Labs Lab 10/25/16 1303 10/26/16 0149  AST 34 28  ALT 15 12*  ALKPHOS 384* 325*  BILITOT 0.8 0.9  PROT 6.2* 5.6*  ALBUMIN 2.9* 2.7*   No results for input(s): LIPASE, AMYLASE in the last 168 hours. No results for input(s): AMMONIA in the last 168 hours. CBC:  Recent Labs Lab 10/25/16 1303 10/25/16 1410 10/26/16 0149 10/27/16 0530  WBC 9.0 9.9 10.6* 8.7  HGB 4.8* 4.6* 7.3* 10.8*  HCT 14.8* 14.1* 21.8* 32.6*  MCV 89.7 89.2 87.2 89.1  PLT 267 254 235 230   Cardiac Enzymes: No results for input(s): CKTOTAL, CKMB, CKMBINDEX, TROPONINI in the last 168 hours. BNP: Invalid input(s): POCBNP CBG: No results for input(s): GLUCAP in the last 168 hours. D-Dimer No results for input(s): DDIMER in the last 72 hours. Hgb A1c No results for input(s): HGBA1C in the last 72 hours. Lipid Profile No results for input(s): CHOL, HDL, LDLCALC, TRIG, CHOLHDL, LDLDIRECT in the last 72 hours. Thyroid function studies No results for input(s): TSH, T4TOTAL, T3FREE, THYROIDAB in the last 72 hours.  Invalid input(s): FREET3 Anemia work up No results for input(s): VITAMINB12, FOLATE, FERRITIN, TIBC, IRON, RETICCTPCT in the last 72 hours. Urinalysis    Component Value Date/Time   COLORURINE YELLOW 10/25/2016 1649   APPEARANCEUR HAZY (A) 10/25/2016 1649   LABSPEC 1.013 10/25/2016 1649   PHURINE 6.0 10/25/2016 1649   GLUCOSEU NEGATIVE 10/25/2016 1649   HGBUR NEGATIVE 10/25/2016 1649   HGBUR negative 07/07/2007 0838   BILIRUBINUR NEGATIVE 10/25/2016 1649   KETONESUR NEGATIVE 10/25/2016 1649   PROTEINUR NEGATIVE 10/25/2016 1649   UROBILINOGEN 1.0 04/22/2015 0020   NITRITE NEGATIVE 10/25/2016 1649   LEUKOCYTESUR NEGATIVE 10/25/2016 1649   Sepsis  Labs Invalid input(s): PROCALCITONIN,  WBC,  LACTICIDVEN   Time coordinating discharge: Over 30 minutes  SIGNED:   Janece Canterbury, MD  [facility] 10/27/2016, 12:43 PM Pager   If 7PM-7AM, please contact night-coverage [email or web address] Password [email or web address]

## 2016-10-27 NOTE — Evaluation (Signed)
Physical Therapy Evaluation Patient Details Name: [name] MRN: [id number] DOB: [birth year] Today's Date: 10/27/2016   History of Present Illness  70 yo female admitted with acute UGIB, N/V, fall during hospital stay. Hx of ETOH abuse, chronic back pain, L femur IM nail 04/2016  Clinical Impression  On eval, pt was Min guard assist for mobility. She walked ~150 feet with a RW (pt normally uses quad cane).Pt c/o L LE pain 7/10 (history of IM nail in 04/2016). LOB x 1 while ambulating in hallway. Pt tolerated activity well. Recommend HHPT follow up if pt is agreeable.     Follow Up Recommendations Home health PT;Supervision/Assistance - 24 hour    Equipment Recommendations  None recommended by PT    Recommendations for Other Services       Precautions / Restrictions Precautions Precautions: Fall Restrictions Weight Bearing Restrictions: No      Mobility  Bed Mobility Overal bed mobility: Modified Independent                Transfers Overall transfer level: Needs assistance   Transfers: Sit to/from Stand Sit to Stand: Min guard         General transfer comment: close guard for safety. VCs safety  Ambulation/Gait Ambulation/Gait assistance: Min guard Ambulation Distance (Feet): 150 Feet Assistive device: Rolling walker (2 wheeled) Gait Pattern/deviations: Step-through pattern;Decreased stride length     General Gait Details: close guard for safety. LOB x1.   Stairs            Wheelchair Mobility    Modified Rankin (Stroke Patients Only)       Balance Overall balance assessment: Needs assistance;History of Falls         Standing balance support: Bilateral upper extremity supported Standing balance-Leahy Scale: Poor Standing balance comment: recommend RW use                             Pertinent Vitals/Pain Pain Assessment: 0-10 Pain Location: L LE Pain Descriptors / Indicators: Aching;Sore;Throbbing Pain Intervention(s):  Monitored during session    Home Living Family/patient expects to be discharged to:: Private residence Living Arrangements: Spouse/significant other Available Help at Discharge: Family Type of Home: House Home Access: Level entry     Home Layout: One level Home Equipment: Environmental consultant - 4 wheels;Cane - quad      Prior Function Level of Independence: Independent with assistive device(s)         Comments: uses quad      Hand Dominance        Extremity/Trunk Assessment   Upper Extremity Assessment Upper Extremity Assessment: Overall WFL for tasks assessed    Lower Extremity Assessment Lower Extremity Assessment: Generalized weakness (noted swelling L LE)       Communication   Communication: No difficulties  Cognition Arousal/Alertness: Awake/alert Behavior During Therapy: WFL for tasks assessed/performed Overall Cognitive Status: Within Functional Limits for tasks assessed                      General Comments      Exercises     Assessment/Plan    PT Assessment All further PT needs can be met in the next venue of care (in the home with HHPT follow up)  PT Problem List Decreased strength;Decreased mobility;Decreased activity tolerance;Decreased balance;Decreased knowledge of use of DME;Pain          PT Treatment Interventions      PT Goals (Current  goals can be found in the Care Plan section)  Acute Rehab PT Goals Patient Stated Goal: home  PT Goal Formulation: All assessment and education complete, DC therapy Time For Goal Achievement: 11/10/16    Frequency     Barriers to discharge        Co-evaluation               End of Session Equipment Utilized During Treatment: Gait belt Activity Tolerance: Patient tolerated treatment well Patient left: in chair;with call bell/phone within reach;with family/visitor present;with chair alarm set           Time: 2563-8937 PT Time Calculation (min) (ACUTE ONLY): 25 min   Charges:   PT  Evaluation $PT Eval Low Complexity: 1 Procedure PT Treatments $Gait Training: 8-22 mins   PT G Codes:        Weston Anna, MPT Pager: [phone]

## 2016-10-28 ENCOUNTER — Encounter (HOSPITAL_COMMUNITY): Payer: Self-pay | Admitting: Gastroenterology

## 2016-10-28 LAB — TYPE AND SCREEN
BLOOD PRODUCT EXPIRATION DATE: 201712272359
BLOOD PRODUCT EXPIRATION DATE: 201801012359
Blood Product Expiration Date: 201801012359
Blood Product Expiration Date: 201801012359
ISSUE DATE / TIME: 201712151711
ISSUE DATE / TIME: 201712152033
ISSUE DATE / TIME: 201712160718
ISSUE DATE / TIME: 201712161002
UNIT TYPE AND RH: 5100
UNIT TYPE AND RH: 5100
Unit Type and Rh: 5100
Unit Type and Rh: 5100

## 2016-10-28 LAB — CBC
HEMATOCRIT: 32.6 % — AB (ref 36.0–46.0)
Hemoglobin: 10.8 g/dL — ABNORMAL LOW (ref 12.0–15.0)
MCH: 29.5 pg (ref 26.0–34.0)
MCHC: 33.1 g/dL (ref 30.0–36.0)
MCV: 89.1 fL (ref 78.0–100.0)
PLATELETS: 230 10*3/uL (ref 150–400)
RBC: 3.66 MIL/uL — ABNORMAL LOW (ref 3.87–5.11)
RDW: 17.5 % — AB (ref 11.5–15.5)
WBC: 8.7 10*3/uL (ref 4.0–10.5)

## 2016-11-22 ENCOUNTER — Encounter: Payer: Self-pay | Admitting: Gastroenterology

## 2016-11-25 ENCOUNTER — Encounter: Payer: Self-pay | Admitting: Gastroenterology

## 2016-12-04 ENCOUNTER — Encounter: Payer: Self-pay | Admitting: *Deleted

## 2016-12-04 ENCOUNTER — Institutional Professional Consult (permissible substitution): Payer: Medicare HMO | Admitting: Emergency Medicine

## 2016-12-07 ENCOUNTER — Encounter (HOSPITAL_COMMUNITY)
Admission: EM | Disposition: A | Discharge status: Home / Self Care | Payer: Self-pay | Source: Home / Self Care | Attending: Internal Medicine

## 2016-12-07 ENCOUNTER — Encounter (HOSPITAL_COMMUNITY): Payer: Self-pay | Admitting: Emergency Medicine

## 2016-12-07 ENCOUNTER — Inpatient Hospital Stay (HOSPITAL_COMMUNITY)
Admission: EM | Admit: 2016-12-07 | Discharge: 2016-12-14 | DRG: 441 | Disposition: A | Discharge status: Home / Self Care | Payer: Medicare HMO | Attending: Internal Medicine | Admitting: Internal Medicine

## 2016-12-07 ENCOUNTER — Inpatient Hospital Stay (HOSPITAL_COMMUNITY): Payer: Medicare HMO

## 2016-12-07 DIAGNOSIS — D500 Iron deficiency anemia secondary to blood loss (chronic): Secondary | ICD-10-CM

## 2016-12-07 DIAGNOSIS — F1010 Alcohol abuse, uncomplicated: Secondary | ICD-10-CM | POA: Diagnosis not present

## 2016-12-07 DIAGNOSIS — K3189 Other diseases of stomach and duodenum: Secondary | ICD-10-CM | POA: Diagnosis present

## 2016-12-07 DIAGNOSIS — M1990 Unspecified osteoarthritis, unspecified site: Secondary | ICD-10-CM | POA: Diagnosis present

## 2016-12-07 DIAGNOSIS — E876 Hypokalemia: Secondary | ICD-10-CM | POA: Diagnosis not present

## 2016-12-07 DIAGNOSIS — F10929 Alcohol use, unspecified with intoxication, unspecified: Secondary | ICD-10-CM

## 2016-12-07 DIAGNOSIS — I8511 Secondary esophageal varices with bleeding: Secondary | ICD-10-CM | POA: Diagnosis present

## 2016-12-07 DIAGNOSIS — F329 Major depressive disorder, single episode, unspecified: Secondary | ICD-10-CM | POA: Diagnosis present

## 2016-12-07 DIAGNOSIS — E7800 Pure hypercholesterolemia, unspecified: Secondary | ICD-10-CM | POA: Diagnosis present

## 2016-12-07 DIAGNOSIS — Z781 Physical restraint status: Secondary | ICD-10-CM

## 2016-12-07 DIAGNOSIS — A0472 Enterocolitis due to Clostridium difficile, not specified as recurrent: Secondary | ICD-10-CM | POA: Diagnosis not present

## 2016-12-07 DIAGNOSIS — G4700 Insomnia, unspecified: Secondary | ICD-10-CM | POA: Diagnosis present

## 2016-12-07 DIAGNOSIS — Z85528 Personal history of other malignant neoplasm of kidney: Secondary | ICD-10-CM

## 2016-12-07 DIAGNOSIS — F10931 Alcohol use, unspecified with withdrawal delirium: Secondary | ICD-10-CM

## 2016-12-07 DIAGNOSIS — Z720 Tobacco use: Secondary | ICD-10-CM | POA: Diagnosis present

## 2016-12-07 DIAGNOSIS — K219 Gastro-esophageal reflux disease without esophagitis: Secondary | ICD-10-CM | POA: Diagnosis present

## 2016-12-07 DIAGNOSIS — Z79899 Other long term (current) drug therapy: Secondary | ICD-10-CM | POA: Diagnosis not present

## 2016-12-07 DIAGNOSIS — F10231 Alcohol dependence with withdrawal delirium: Secondary | ICD-10-CM | POA: Diagnosis not present

## 2016-12-07 DIAGNOSIS — K7030 Alcoholic cirrhosis of liver without ascites: Secondary | ICD-10-CM | POA: Diagnosis present

## 2016-12-07 DIAGNOSIS — K920 Hematemesis: Secondary | ICD-10-CM

## 2016-12-07 DIAGNOSIS — Z9114 Patient's other noncompliance with medication regimen: Secondary | ICD-10-CM | POA: Diagnosis not present

## 2016-12-07 DIAGNOSIS — K922 Gastrointestinal hemorrhage, unspecified: Secondary | ICD-10-CM | POA: Diagnosis not present

## 2016-12-07 DIAGNOSIS — R0682 Tachypnea, not elsewhere classified: Secondary | ICD-10-CM

## 2016-12-07 DIAGNOSIS — K766 Portal hypertension: Principal | ICD-10-CM | POA: Diagnosis present

## 2016-12-07 DIAGNOSIS — D696 Thrombocytopenia, unspecified: Secondary | ICD-10-CM | POA: Diagnosis not present

## 2016-12-07 DIAGNOSIS — I8500 Esophageal varices without bleeding: Secondary | ICD-10-CM

## 2016-12-07 DIAGNOSIS — D62 Acute posthemorrhagic anemia: Secondary | ICD-10-CM | POA: Diagnosis present

## 2016-12-07 DIAGNOSIS — F17210 Nicotine dependence, cigarettes, uncomplicated: Secondary | ICD-10-CM | POA: Diagnosis present

## 2016-12-07 DIAGNOSIS — J45909 Unspecified asthma, uncomplicated: Secondary | ICD-10-CM | POA: Diagnosis present

## 2016-12-07 DIAGNOSIS — F32A Depression, unspecified: Secondary | ICD-10-CM | POA: Diagnosis present

## 2016-12-07 HISTORY — PX: ESOPHAGOGASTRODUODENOSCOPY: SHX5428

## 2016-12-07 LAB — CBC
HEMATOCRIT: 20.1 % — AB (ref 36.0–46.0)
HEMATOCRIT: 22.4 % — AB (ref 36.0–46.0)
HEMOGLOBIN: 6.4 g/dL — AB (ref 12.0–15.0)
HEMOGLOBIN: 7.1 g/dL — AB (ref 12.0–15.0)
MCH: 30 pg (ref 26.0–34.0)
MCH: 28 pg (ref 26.0–34.0)
MCHC: 31.8 g/dL (ref 30.0–36.0)
MCHC: 31.7 g/dL (ref 30.0–36.0)
MCV: 94.4 fL (ref 78.0–100.0)
MCV: 88.2 fL (ref 78.0–100.0)
Platelets: 324 10*3/uL (ref 150–400)
Platelets: 239 10*3/uL (ref 150–400)
RBC: 2.13 MIL/uL — AB (ref 3.87–5.11)
RBC: 2.54 MIL/uL — AB (ref 3.87–5.11)
RDW: 19.1 % — ABNORMAL HIGH (ref 11.5–15.5)
RDW: 20 % — ABNORMAL HIGH (ref 11.5–15.5)
WBC: 14.2 10*3/uL — ABNORMAL HIGH (ref 4.0–10.5)
WBC: 14.3 10*3/uL — AB (ref 4.0–10.5)

## 2016-12-07 LAB — COMPREHENSIVE METABOLIC PANEL
ALBUMIN: 2.9 g/dL — AB (ref 3.5–5.0)
ALT: 44 U/L (ref 14–54)
ANION GAP: 13 (ref 5–15)
AST: 145 U/L — ABNORMAL HIGH (ref 15–41)
Alkaline Phosphatase: 610 U/L — ABNORMAL HIGH (ref 38–126)
BILIRUBIN TOTAL: 2.2 mg/dL — AB (ref 0.3–1.2)
BUN: 42 mg/dL — ABNORMAL HIGH (ref 6–20)
CO2: 22 mmol/L (ref 22–32)
Calcium: 8.3 mg/dL — ABNORMAL LOW (ref 8.9–10.3)
Chloride: 99 mmol/L — ABNORMAL LOW (ref 101–111)
Creatinine, Ser: 0.79 mg/dL (ref 0.44–1.00)
GFR calc non Af Amer: >60 mL/min (ref >60)
GLUCOSE: 114 mg/dL — AB (ref 65–99)
POTASSIUM: 4.6 mmol/L (ref 3.5–5.1)
SODIUM: 134 mmol/L — AB (ref 135–145)
Total Protein: 6.7 g/dL (ref 6.5–8.1)

## 2016-12-07 LAB — I-STAT CHEM 8, ED
BUN: 37 mg/dL — AB (ref 6–20)
CHLORIDE: 100 mmol/L — AB (ref 101–111)
CREATININE: 0.9 mg/dL (ref 0.44–1.00)
Calcium, Ion: 1.03 mmol/L — ABNORMAL LOW (ref 1.15–1.40)
Glucose, Bld: 113 mg/dL — ABNORMAL HIGH (ref 65–99)
HEMATOCRIT: 22 % — AB (ref 36.0–46.0)
Hemoglobin: 7.5 g/dL — ABNORMAL LOW (ref 12.0–15.0)
POTASSIUM: 4.6 mmol/L (ref 3.5–5.1)
Sodium: 134 mmol/L — ABNORMAL LOW (ref 135–145)
TCO2: 23 mmol/L (ref 0–100)

## 2016-12-07 LAB — I-STAT CG4 LACTIC ACID, ED: Lactic Acid, Venous: 4.27 mmol/L (ref 0.5–1.9)

## 2016-12-07 LAB — ETHANOL

## 2016-12-07 LAB — MRSA PCR SCREENING: MRSA by PCR: NEGATIVE

## 2016-12-07 LAB — LIPASE, BLOOD: LIPASE: 15 U/L (ref 11–51)

## 2016-12-07 LAB — PREPARE RBC (CROSSMATCH)

## 2016-12-07 LAB — POC OCCULT BLOOD, ED: Fecal Occult Bld: POSITIVE — AB

## 2016-12-07 SURGERY — EGD (ESOPHAGOGASTRODUODENOSCOPY)
Anesthesia: Moderate Sedation

## 2016-12-07 MED ORDER — SODIUM CHLORIDE 0.9 % IV SOLN
INTRAVENOUS | Status: DC
  Administered 2016-12-07 – 2016-12-12 (×9): via INTRAVENOUS

## 2016-12-07 MED ORDER — THIAMINE HCL 100 MG/ML IJ SOLN
100.0000 mg | Freq: Every day | INTRAMUSCULAR | Status: DC
  Administered 2016-12-07 – 2016-12-12 (×6): 100 mg via INTRAVENOUS
  Filled 2016-12-07 (×6): qty 2

## 2016-12-07 MED ORDER — FOLIC ACID 5 MG/ML IJ SOLN
1.0000 mg | Freq: Every day | INTRAMUSCULAR | Status: DC
  Administered 2016-12-08 – 2016-12-12 (×5): 1 mg via INTRAVENOUS
  Filled 2016-12-07 (×6): qty 0.2

## 2016-12-07 MED ORDER — SODIUM CHLORIDE 0.9 % IV BOLUS (SEPSIS)
1000.0000 mL | Freq: Once | INTRAVENOUS | Status: AC
  Administered 2016-12-07: 1000 mL via INTRAVENOUS

## 2016-12-07 MED ORDER — MIDAZOLAM HCL 5 MG/ML IJ SOLN
INTRAMUSCULAR | Status: AC
  Filled 2016-12-07: qty 3

## 2016-12-07 MED ORDER — ZOLPIDEM TARTRATE 5 MG PO TABS
5.0000 mg | ORAL_TABLET | Freq: Every evening | ORAL | Status: DC | PRN
  Administered 2016-12-07 – 2016-12-13 (×7): 5 mg via ORAL
  Filled 2016-12-07 (×8): qty 1

## 2016-12-07 MED ORDER — FENTANYL CITRATE (PF) 100 MCG/2ML IJ SOLN
INTRAMUSCULAR | Status: DC | PRN
  Administered 2016-12-07 (×4): 25 ug via INTRAVENOUS

## 2016-12-07 MED ORDER — SODIUM CHLORIDE 0.9 % IV BOLUS (SEPSIS): 1000.0000 mL | Freq: Once | INTRAVENOUS | Status: DC

## 2016-12-07 MED ORDER — SODIUM CHLORIDE 0.9 % IV SOLN: 10.0000 mL/h | Freq: Once | INTRAVENOUS | Status: DC

## 2016-12-07 MED ORDER — POLYVINYL ALCOHOL 1.4 % OP SOLN
1.0000 [drp] | OPHTHALMIC | Status: DC | PRN
  Administered 2016-12-07: 1 [drp] via OPHTHALMIC
  Filled 2016-12-07: qty 15

## 2016-12-07 MED ORDER — MOMETASONE FURO-FORMOTEROL FUM 200-5 MCG/ACT IN AERO
2.0000 | INHALATION_SPRAY | Freq: Two times a day (BID) | RESPIRATORY_TRACT | Status: DC
  Administered 2016-12-07 – 2016-12-09 (×5): 2 via RESPIRATORY_TRACT
  Filled 2016-12-07: qty 8.8

## 2016-12-07 MED ORDER — SODIUM CHLORIDE 0.9 % IV SOLN: Freq: Once | INTRAVENOUS | Status: DC

## 2016-12-07 MED ORDER — DIPHENHYDRAMINE HCL 50 MG/ML IJ SOLN
INTRAMUSCULAR | Status: DC | PRN
  Administered 2016-12-07: 25 mg via INTRAVENOUS

## 2016-12-07 MED ORDER — FENTANYL CITRATE (PF) 100 MCG/2ML IJ SOLN
INTRAMUSCULAR | Status: AC
  Filled 2016-12-07: qty 4

## 2016-12-07 MED ORDER — SODIUM CHLORIDE 0.9% FLUSH
3.0000 mL | Freq: Two times a day (BID) | INTRAVENOUS | Status: DC
  Administered 2016-12-07 – 2016-12-14 (×9): 3 mL via INTRAVENOUS

## 2016-12-07 MED ORDER — DEXTROSE 5 % IV SOLN
1.0000 g | Freq: Once | INTRAVENOUS | Status: AC
  Administered 2016-12-07: 1 g via INTRAVENOUS
  Filled 2016-12-07: qty 10

## 2016-12-07 MED ORDER — BUTAMBEN-TETRACAINE-BENZOCAINE 2-2-14 % EX AERO
INHALATION_SPRAY | CUTANEOUS | Status: DC | PRN
  Administered 2016-12-07: 2 via TOPICAL

## 2016-12-07 MED ORDER — SODIUM CHLORIDE 0.9 % IV SOLN: INTRAVENOUS | Status: DC

## 2016-12-07 MED ORDER — DIPHENHYDRAMINE HCL 50 MG/ML IJ SOLN
INTRAMUSCULAR | Status: AC
Start: 2016-12-07 — End: 2016-12-07
  Filled 2016-12-07: qty 1

## 2016-12-07 MED ORDER — ALBUTEROL SULFATE (2.5 MG/3ML) 0.083% IN NEBU: 2.5000 mg | INHALATION_SOLUTION | RESPIRATORY_TRACT | Status: DC | PRN

## 2016-12-07 MED ORDER — LORAZEPAM 2 MG/ML IJ SOLN
2.0000 mg | INTRAMUSCULAR | Status: DC | PRN
  Administered 2016-12-07 – 2016-12-08 (×4): 2 mg via INTRAVENOUS
  Filled 2016-12-07 (×4): qty 1

## 2016-12-07 MED ORDER — SODIUM CHLORIDE 0.9 % IV SOLN
80.0000 mg | Freq: Two times a day (BID) | INTRAVENOUS | Status: DC
  Administered 2016-12-07 – 2016-12-11 (×9): 80 mg via INTRAVENOUS
  Filled 2016-12-07 (×14): qty 80

## 2016-12-07 MED ORDER — MIDAZOLAM HCL 10 MG/2ML IJ SOLN
INTRAMUSCULAR | Status: DC | PRN
  Administered 2016-12-07: 1 mg via INTRAVENOUS
  Administered 2016-12-07: 2 mg via INTRAVENOUS
  Administered 2016-12-07 (×2): 1 mg via INTRAVENOUS
  Administered 2016-12-07: 2 mg via INTRAVENOUS
  Administered 2016-12-07 (×3): 1 mg via INTRAVENOUS

## 2016-12-07 MED ORDER — OCTREOTIDE ACETATE 500 MCG/ML IJ SOLN
50.0000 ug/h | INTRAMUSCULAR | Status: DC
  Administered 2016-12-07 – 2016-12-10 (×6): 50 ug/h via INTRAVENOUS
  Filled 2016-12-07 (×15): qty 1

## 2016-12-07 MED ORDER — OCTREOTIDE LOAD VIA INFUSION
50.0000 ug | Freq: Once | INTRAVENOUS | Status: AC
  Administered 2016-12-07: 50 ug via INTRAVENOUS
  Filled 2016-12-07: qty 25

## 2016-12-07 MED ORDER — PANTOPRAZOLE SODIUM 40 MG IV SOLR
80.0000 mg | Freq: Once | INTRAVENOUS | Status: AC
  Administered 2016-12-07: 80 mg via INTRAVENOUS
  Filled 2016-12-07: qty 80

## 2016-12-07 NOTE — Op Note (Signed)
[facility] Patient Name: [name] Procedure Date: 12/07/2016 MRN: [id number] Attending MD: Wonda Horner , MD Date of Birth: [birth year] CSN: [id number] Age: 71 Admit Type: Inpatient Procedure:                Upper GI endoscopy Indications:              Hematemesis Providers:                Wonda Horner, MD, Elna Breslow, RN, Cherylynn Ridges, Technician Referring MD:              Medicines:                Fentanyl 100 micrograms IV, Midazolam 10 mg IV,                            Diphenhydramine 25 mg IV Complications:            No immediate complications. Estimated Blood Loss:     Estimated blood loss: none. Procedure:                Pre-Anesthesia Assessment:                           - Prior to the procedure, a History and Physical                            was performed, and patient medications and                            allergies were reviewed. The patient's tolerance of                            previous anesthesia was also reviewed. The risks                            and benefits of the procedure and the sedation                            options and risks were discussed with the patient.                            All questions were answered, and informed consent                            was obtained. Prior Anticoagulants: The patient has                            taken no previous anticoagulant or antiplatelet                            agents. ASA Grade Assessment: III - A patient with  severe systemic disease. After reviewing the risks                            and benefits, the patient was deemed in                            satisfactory condition to undergo the procedure.                           After obtaining informed consent, the endoscope was                            passed under direct vision. Throughout the                            procedure, the patient's blood pressure,  pulse, and                            oxygen saturations were monitored continuously. The                            [id number]) scope was introduced through the                            mouth, and advanced to the second part of duodenum.                            The upper GI endoscopy was accomplished without                            difficulty. The patient tolerated the procedure                            fairly well. Scope In: Scope Out: Findings:      Grade I varices were found in the lower third of the esophagus.       Flattened with insufflation. No stigmata of bleeding.      Portal hypertensive gastropathy was found in the stomach. Clot in       dependent portion of stomach on greater curvature.Unable to see under       it. No accumulation of blood however seen coming from under it.      No gastric varices seen.      The examined duodenum was normal. Impression:               - Grade I esophageal varices.                           - Portal hypertensive gastropathy.                           - Normal examined duodenum.                           - No specimens collected. Moderate Sedation:      . Recommendation:           -  NPO today.                           - Continue present medications.                           - Continue PPI and Octreotide.                           - Continue PPI and Octreotide Procedure Code(s):        --- Professional ---                           [id number], Esophagogastroduodenoscopy, flexible,                            transoral; diagnostic, including collection of                            specimen(s) by brushing or washing, when performed                            (separate procedure) Diagnosis Code(s):        --- Professional ---                           I85.00, Esophageal varices without bleeding                           K76.6, Portal hypertension                           K31.89, Other diseases of stomach and duodenum                            K92.0, Hematemesis CPT copyright 2016 [employer]. All rights reserved. The codes documented in this report are preliminary and upon coder review may  be revised to meet current compliance requirements. Wonda Horner, MD 12/07/2016 2:01:05 PM This report has been signed electronically. Number of Addenda: 0

## 2016-12-07 NOTE — Consult Note (Signed)
Subjective:   HPI  The patient is a 71 year old female who presents to the emergency room with complaints of melena and hematemesis which started last night about 6 PM. She has a history of alcohol abuse and and continues to drink alcohol. She has a history of GI bleeds in the past. In reviewing her records she was in the hospital a couple of times last year and had endoscopy both by Dr. Ardis Hughs and Dr. Paulita Fujita. 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 She was treated conservatively and subsequently discharged in the past. She never followed up with her physicians. She returns today with complaints again of melena and hematemesis. She denies using NSAIDs. There has been a drop in her hemoglobin and hematocrit.  Review of Systems She was having some epigastric discomfort. No chest pain.  Past Medical History:  Diagnosis Date  . Alcohol abuse   . Alcohol abuse   . Allergy   . Arthritis    back-severe, hips, right knee  . Asthma   . GERD (gastroesophageal reflux disease)    occasional  . H/O measles   . H/O mumps   . Hypercholesteremia    under control  . Insomnia   . Psoriasis (a type of skin inflammation)   . Renal cell carcinoma 2012   left  . Seasonal allergies    Past Surgical History:  Procedure Laterality Date  . ABDOMINAL HYSTERECTOMY  40years ago  . BUNIONECTOMY  04/2011  . CHOLECYSTECTOMY  11/13/2011   Procedure: LAPAROSCOPIC CHOLECYSTECTOMY WITH INTRAOPERATIVE CHOLANGIOGRAM;  Surgeon: Judieth Keens, DO;  Location: [facility];  Service: General;  Laterality: N/A;  . COLONOSCOPY N/A 09/25/2013   Procedure: COLONOSCOPY;  Surgeon: Lear Ng, MD;  [facility]: [facility];  Service: Endoscopy;  Laterality: N/A;  . ESOPHAGOGASTRODUODENOSCOPY N/A 09/25/2013   Procedure:  ESOPHAGOGASTRODUODENOSCOPY (EGD);  Surgeon: Lear Ng, MD;  Location: [facility];  Service: Endoscopy;  Laterality: N/A;  . ESOPHAGOGASTRODUODENOSCOPY N/A 04/07/2016   Procedure: ESOPHAGOGASTRODUODENOSCOPY (EGD);  Surgeon: Milus Banister, MD;  Location: [facility];  Service: Endoscopy;  Laterality: N/A;  . ESOPHAGOGASTRODUODENOSCOPY N/A 10/26/2016   Procedure: ESOPHAGOGASTRODUODENOSCOPY (EGD);  Surgeon: Arta Silence, MD;  Location: [facility];  Service: Endoscopy;  Laterality: N/A;  . ESOPHAGOGASTRODUODENOSCOPY (EGD) WITH PROPOFOL Left 01/03/2016   Procedure: ESOPHAGOGASTRODUODENOSCOPY (EGD) WITH PROPOFOL;  Surgeon: Arta Silence, MD;  [facility];  Service: Endoscopy;  Laterality: Left;  . FEMUR IM NAIL Left 04/15/2016   Procedure: INTRAMEDULLARY (IM) RETROGRADE FEMORAL NAILING;  Surgeon: Gaynelle Arabian, MD;  Location: [facility];  Service: Orthopedics;  Laterality: Left;  . HERNIA REPAIR  [id number]   supraumbilical repair  . KIDNEY SURGERY  12/2010   [facility]; partial nephrectomy  . LUMBAR LAMINECTOMY/DECOMPRESSION MICRODISCECTOMY Right 10/12/2014   Procedure: HEMI LAMINECTOMY MICRODISCECTOMY L5-S1 RIGHT (1 LEVEL);  Surgeon: Tobi Bastos, MD;  [facility]: [facility];  Service: Orthopedics;  Laterality: Right;  . MENISECTOMY  2010   left knee  . TUBAL LIGATION  44 years ago   Social History   Social History  . Marital status: Divorced    Spouse name: N/A  . Number of children: 3  . Years of education: 12   Occupational History  . Retired    Social History Main Topics  . Smoking status: Current Some Day Smoker    Packs/day: 0.15  Years: 6.00    Types: Cigarettes  . Smokeless tobacco: Never Used  . Alcohol use 1.2 oz/week    1 Glasses of wine, 1 Shots of liquor per week     Comment: socially last drink 04/10/16   . Drug use: No  . Sexual activity: Yes   Other Topics Concern  . Not on file   Social History Narrative   Fun: play cards, play with her  grandchildren, cooking   Denies religious beliefs effecting health care.    family history includes Hyperlipidemia in her mother; Hypertension in her mother.  Current Facility-Administered Medications:  .  [COMPLETED] sodium chloride 0.9 % bolus 1,000 mL, 1,000 mL, Intravenous, Once, Last Rate: 1,000 mL/hr at 12/07/16 0846, 1,000 mL at 12/07/16 0846 **AND** 0.9 %  sodium chloride infusion, , Intravenous, Continuous, Leo Grosser, MD, Last Rate: 125 mL/hr at 12/07/16 0904 .  0.9 %  sodium chloride infusion, 10 mL/hr, Intravenous, Once, Leo Grosser, MD .  Margrett Rud octreotide (SANDOSTATIN) 2 mcg/mL load via infusion 50 mcg, 50 mcg, Intravenous, Once, 50 mcg at 12/07/16 0846 **AND** octreotide (SANDOSTATIN) 500 mcg in sodium chloride 0.9 % 250 mL (2 mcg/mL) infusion, 50 mcg/hr, Intravenous, Continuous, Leo Grosser, MD, Last Rate: 25 mL/hr at 12/07/16 0852, 50 mcg/hr at 12/07/16 [id number]  Current Outpatient Prescriptions:  .  albuterol (PROVENTIL HFA;VENTOLIN HFA) 108 (90 Base) MCG/ACT inhaler, Inhale 2 puffs into the lungs every 6 (six) hours as needed for wheezing or shortness of breath., Disp: 3 Inhaler, Rfl: 1 .  cyclobenzaprine (FLEXERIL) 10 MG tablet, Take 10 mg by mouth 3 (three) times daily as needed for muscle spasms., Disp: , Rfl:  .  docusate sodium (COLACE) 100 MG capsule, Take 100 mg by mouth 2 (two) times daily as needed for mild constipation., Disp: , Rfl:  .  DULoxetine (CYMBALTA) 60 MG capsule, Take 1 capsule (60 mg total) by mouth daily., Disp: 90 capsule, Rfl: 1 .  famotidine (PEPCID) 20 MG tablet, Take 20 mg by mouth 2 (two) times daily., Disp: , Rfl:  .  ferrous sulfate 325 (65 FE) MG tablet, Take 1 tablet (325 mg total) by mouth 2 (two) times daily with a meal., Disp: 60 tablet, Rfl: 0 .  Fluticasone-Salmeterol (ADVAIR DISKUS) 500-50 MCG/DOSE AEPB, INHALE 1 PUFF INTO THE LUNGS TWICE A DAY, Disp: 60 each, Rfl: 1 .  folic acid (FOLVITE) 1 MG tablet, Take 1 tablet (1 mg total) by  mouth daily., Disp: 30 tablet, Rfl: 0 .  HYDROcodone-acetaminophen (NORCO/VICODIN) 5-325 MG tablet, Take 1 tablet by mouth every 6 (six) hours as needed. for pain, Disp: , Rfl: 0 .  Icosapent Ethyl (VASCEPA) 1 g CAPS, Take 1 g by mouth 2 (two) times daily., Disp: , Rfl:  .  LORazepam (ATIVAN) 1 MG tablet, Take 1 tablet (1 mg total) by mouth 2 (two) times daily as needed for anxiety (Anxiety, shakiness. DO NOT USE WITH ALCOHOL.)., Disp: 60 tablet, Rfl: 0 .  magnesium oxide (MAG-OX) 400 MG tablet, Take 400 mg by mouth daily as needed., Disp: , Rfl:  .  methocarbamol (ROBAXIN) 500 MG tablet, Take 750 mg by mouth every 4 (four) hours as needed for muscle spasms., Disp: , Rfl:  .  montelukast (SINGULAIR) 10 MG tablet, Take 1 tablet (10 mg total) by mouth at bedtime., Disp: 30 tablet, Rfl: 0 .  Multiple Vitamin (MULTIVITAMIN WITH MINERALS) TABS tablet, Take 1 tablet by mouth daily., Disp: 30 tablet, Rfl: 0 .  pantoprazole (PROTONIX) 40 MG tablet,  Take 1 tablet (40 mg total) by mouth daily., Disp: 30 tablet, Rfl: 0 .  PARoxetine (PAXIL) 10 MG tablet, Take 0.5 tablets (5 mg total) by mouth daily., Disp: 30 tablet, Rfl: 0 .  polyethylene glycol (MIRALAX / GLYCOLAX) packet, Take 17 g by mouth daily., Disp: 30 each, Rfl: 0 .  potassium chloride SA (K-DUR,KLOR-CON) 20 MEQ tablet, Take 1 tablet (20 mEq total) by mouth daily., Disp: 90 tablet, Rfl: 1 .  propranolol (INDERAL) 10 MG tablet, Take 1 tablet (10 mg total) by mouth 2 (two) times daily., Disp: 60 tablet, Rfl: 0 .  simvastatin (ZOCOR) 40 MG tablet, Take 1 tablet (40 mg total) by mouth every morning., Disp: 90 tablet, Rfl: 1 .  sucralfate (CARAFATE) 1 g tablet, Take 1 g by mouth 4 (four) times daily. Take 1 tablet at bedtime on an empty stomach and before meals BID , Disp: , Rfl:  .  thiamine 100 MG tablet, Take 1 tablet (100 mg total) by mouth daily., Disp: 30 tablet, Rfl: 0 .  zolpidem (AMBIEN) 5 MG tablet, Take 1 tablet (5 mg total) by mouth at bedtime  as needed. for sleep (Patient not taking: Reported on 12/07/2016), Disp: 30 tablet, Rfl: 0 Allergies  Allergen Reactions  . Azithromycin Itching and Swelling  . Morphine And Related Itching     Objective:     BP 101/59 (BP Location: Left Arm)   Pulse (!) 125   Temp 98.1 F (36.7 C) (Oral)   Resp 23   SpO2 99%   She is alert and oriented and currently does not appear in any acute distress  Nonicteric  Heart regular rhythm no murmurs  Lungs clear  Abdomen: Bowel sounds normal, soft, nontender, no hepatosplenomegaly  Review of a CT scan from last year showed the liver to look normal.  Laboratory No components found for: D1    Assessment:     Upper GI bleed characterized by hematemesis and melena in a patient with known alcohol abuse, portal hypertensive gastropathy, small esophageal varices, and a question of a gastric varix seen on last endoscopy in December.      Plan:     Admit to hospital under hospitalist service. PPI therapy. Octreotide drip. Transfuse blood. Watch for further signs of bleeding. Repeat EGD will be planned. She must avoid alcohol. Lab Results  Component Value Date   HGB 7.5 (L) 12/07/2016   HGB 6.4 (LL) 12/07/2016   HGB 10.8 (L) 10/27/2016   HCT 22.0 (L) 12/07/2016   HCT 20.1 (L) 12/07/2016   HCT 32.6 (L) 10/27/2016   ALKPHOS 610 (H) 12/07/2016   ALKPHOS 325 (H) 10/26/2016   ALKPHOS 384 (H) 10/25/2016   AST 145 (H) 12/07/2016   AST 28 10/26/2016   AST 34 10/25/2016   ALT 44 12/07/2016   ALT 12 (L) 10/26/2016   ALT 15 10/25/2016

## 2016-12-07 NOTE — H&P (Signed)
History and Physical   [name] [id number] DOB: 04-23-1946 DOA: 12/07/2016  Referring MD/NP/PA: Dr. Laneta Simmers, [place] PCP: No primary care provider on file. Outpatient Specialists: [facility]  Patient coming from: Home  Chief Complaint: Hematemesis  HPI: [name] is a 71 y.o. female with a history of GI bleeding and alcohol abuse presenting for melena and hematemesis. She reports abrupt onset yesterday evening of frequent bloody emesis and dark stools associated with nausea and abdominal discomfort that is moderate, diffuse, worse with emesis. Today she noticed severe lightheadedness and fatigue, in addition to ongoing hematemesis, approximately 5-6 episodes in past 12 hours and 2 small volume dark stools.   She has not been compliant with medications and reports drinking a half gallon of vodka every 2-3 days, which she hides from her husband who believes she has a drinking problem. She stopped drinking within the past few months for a few weeks and noted mild tremor without seizures. Started back drinking due to stress.   ED Course: On arrival she was tachycardic at 118bpm, BP 113/89 and afebrile, 100% on room air. Hgb 6.4, down from 10.8 on 12/17, and FOBT was positive. GI called for evaluation and [facility] asked to admit.   Review of Systems: No fevers, chills, weight loss, dysphagia, and per HPI. All others reviewed and are negative.   Past Medical History:  Diagnosis Date  . Alcohol abuse   . Alcohol abuse   . Allergy   . Arthritis    back-severe, hips, right knee  . Asthma   . GERD (gastroesophageal reflux disease)    occasional  . H/O measles   . H/O mumps   . Hypercholesteremia    under control  . Insomnia   . Psoriasis (a type of skin inflammation)   . Renal cell carcinoma 2012   left  . Seasonal allergies     Past Surgical History:  Procedure Laterality Date  . ABDOMINAL HYSTERECTOMY  40years ago  . BUNIONECTOMY  04/2011  . CHOLECYSTECTOMY  11/13/2011   Procedure:  LAPAROSCOPIC CHOLECYSTECTOMY WITH INTRAOPERATIVE CHOLANGIOGRAM;  Surgeon: Judieth Keens, DO;  Location: [facility];  Service: General;  Laterality: N/A;  . COLONOSCOPY N/A 09/25/2013   Procedure: COLONOSCOPY;  Surgeon: Lear Ng, MD;  Location: [facility];  Service: Endoscopy;  Laterality: N/A;  . ESOPHAGOGASTRODUODENOSCOPY N/A 09/25/2013   Procedure: ESOPHAGOGASTRODUODENOSCOPY (EGD);  Surgeon: Lear Ng, MD;  Location: [facility];  Service: Endoscopy;  Laterality: N/A;  . ESOPHAGOGASTRODUODENOSCOPY N/A 04/07/2016   Procedure: ESOPHAGOGASTRODUODENOSCOPY (EGD);  Surgeon: Milus Banister, MD;  Location: [facility];  Service: Endoscopy;  Laterality: N/A;  . ESOPHAGOGASTRODUODENOSCOPY N/A 10/26/2016   Procedure: ESOPHAGOGASTRODUODENOSCOPY (EGD);  Surgeon: Arta Silence, MD;  Location: [facility];  Service: Endoscopy;  Laterality: N/A;  . ESOPHAGOGASTRODUODENOSCOPY (EGD) WITH PROPOFOL Left 01/03/2016   Procedure: ESOPHAGOGASTRODUODENOSCOPY (EGD) WITH PROPOFOL;  Surgeon: Arta Silence, MD;  [facility]: [facility];  Service: Endoscopy;  Laterality: Left;  . FEMUR IM NAIL Left 04/15/2016   Procedure: INTRAMEDULLARY (IM) RETROGRADE FEMORAL NAILING;  Surgeon: Gaynelle Arabian, MD;  Location: [facility];  Service: Orthopedics;  Laterality: Left;  . HERNIA REPAIR  [id number]   supraumbilical repair  . KIDNEY SURGERY  12/2010   [facility]; partial nephrectomy  . LUMBAR LAMINECTOMY/DECOMPRESSION MICRODISCECTOMY Right 10/12/2014   Procedure: HEMI LAMINECTOMY MICRODISCECTOMY L5-S1 RIGHT (1 LEVEL);  Surgeon: Tobi Bastos, MD;  Location: [facility];  Service: Orthopedics;  Laterality: Right;  . MENISECTOMY  2010   left knee  .  TUBAL LIGATION  44 years ago    - Smoking 3-5 cigarettes daily. Drinking vodka heavily. Denies illicit substance use. Lives with husband.  Allergies  Allergen Reactions  . Azithromycin Itching and Swelling  . Morphine And Related Itching    Family History  Problem  Relation Age of Onset  . Hyperlipidemia Mother   . Hypertension Mother    - Family history otherwise reviewed and not pertinent.  Prior to Admission medications   Medication Sig Start Date End Date Taking? Authorizing Provider  albuterol (PROVENTIL HFA;VENTOLIN HFA) 108 (90 Base) MCG/ACT inhaler Inhale 2 puffs into the lungs every 6 (six) hours as needed for wheezing or shortness of breath. 08/05/16  Yes Golden Circle, FNP  cyclobenzaprine (FLEXERIL) 10 MG tablet Take 10 mg by mouth 3 (three) times daily as needed for muscle spasms.   Yes Historical Provider, MD  docusate sodium (COLACE) 100 MG capsule Take 100 mg by mouth 2 (two) times daily as needed for mild constipation.   Yes Historical Provider, MD  DULoxetine (CYMBALTA) 60 MG capsule Take 1 capsule (60 mg total) by mouth daily. 08/05/16  Yes Golden Circle, FNP  famotidine (PEPCID) 20 MG tablet Take 20 mg by mouth 2 (two) times daily.   Yes Historical Provider, MD  ferrous sulfate 325 (65 FE) MG tablet Take 1 tablet (325 mg total) by mouth 2 (two) times daily with a meal. 10/27/16  Yes Janece Canterbury, MD  Fluticasone-Salmeterol (ADVAIR DISKUS) 500-50 MCG/DOSE AEPB INHALE 1 PUFF INTO THE LUNGS TWICE A DAY 08/01/16  Yes Golden Circle, FNP  folic acid (FOLVITE) 1 MG tablet Take 1 tablet (1 mg total) by mouth daily. 04/18/16  Yes Shanker Kristeen Mans, MD  HYDROcodone-acetaminophen (NORCO/VICODIN) 5-325 MG tablet Take 1 tablet by mouth every 6 (six) hours as needed. for pain 11/12/16  Yes Historical Provider, MD  Icosapent Ethyl (VASCEPA) 1 g CAPS Take 1 g by mouth 2 (two) times daily.   Yes Historical Provider, MD  LORazepam (ATIVAN) 1 MG tablet Take 1 tablet (1 mg total) by mouth 2 (two) times daily as needed for anxiety (Anxiety, shakiness. DO NOT USE WITH ALCOHOL.). 08/01/16  Yes Golden Circle, FNP  magnesium oxide (MAG-OX) 400 MG tablet Take 400 mg by mouth daily as needed.   Yes Historical Provider, MD  methocarbamol (ROBAXIN) 500 MG  tablet Take 750 mg by mouth every 4 (four) hours as needed for muscle spasms.   Yes Historical Provider, MD  montelukast (SINGULAIR) 10 MG tablet Take 1 tablet (10 mg total) by mouth at bedtime. 08/01/16  Yes Golden Circle, FNP  Multiple Vitamin (MULTIVITAMIN WITH MINERALS) TABS tablet Take 1 tablet by mouth daily. 04/18/16  Yes Shanker Kristeen Mans, MD  pantoprazole (PROTONIX) 40 MG tablet Take 1 tablet (40 mg total) by mouth daily. 10/27/16  Yes Janece Canterbury, MD  PARoxetine (PAXIL) 10 MG tablet Take 0.5 tablets (5 mg total) by mouth daily. 08/01/16  Yes Golden Circle, FNP  polyethylene glycol (MIRALAX / GLYCOLAX) packet Take 17 g by mouth daily. 08/01/16  Yes Golden Circle, FNP  potassium chloride SA (K-DUR,KLOR-CON) 20 MEQ tablet Take 1 tablet (20 mEq total) by mouth daily. 08/05/16  Yes Golden Circle, FNP  propranolol (INDERAL) 10 MG tablet Take 1 tablet (10 mg total) by mouth 2 (two) times daily. 10/27/16  Yes Janece Canterbury, MD  simvastatin (ZOCOR) 40 MG tablet Take 1 tablet (40 mg total) by mouth every morning. 08/05/16  Yes  Golden Circle, FNP  sucralfate (CARAFATE) 1 g tablet Take 1 g by mouth 4 (four) times daily. Take 1 tablet at bedtime on an empty stomach and before meals BID    Yes Historical Provider, MD  thiamine 100 MG tablet Take 1 tablet (100 mg total) by mouth daily. 04/18/16  Yes Shanker Kristeen Mans, MD  zolpidem (AMBIEN) 5 MG tablet Take 1 tablet (5 mg total) by mouth at bedtime as needed. for sleep Patient not taking: Reported on 12/07/2016 08/01/16   Golden Circle, FNP    Physical Exam: Vitals:   12/07/16 1345 12/07/16 1350 12/07/16 1355 12/07/16 1419  BP: 130/81 131/83 120/71 (!) 95/57  Pulse: (!) 114 (!) 119 (!) 115 (!) 108  Resp: (!) 23 16 20 16   Temp:   98.3 F (36.8 C) 99 F (37.2 C)  TempSrc:   Oral Oral  SpO2: 100% 100% 100% 99%  Weight:      Height:       Constitutional: 71 y.o. female in no distress, calm demeanor Eyes: Lids normal and +  conjunctival pallor, PERRL ENMT: Mucous membranes are moist. Posterior pharynx clear of any exudate or lesions. Poor dentition.  Neck: normal, supple, no masses, no thyromegaly Respiratory: Non-labored breathing room air without accessory muscle use. Clear breath sounds to auscultation bilaterally Cardiovascular: Tachycardic rate and regular rhythm, no murmurs, rubs, or gallops. No carotid bruits. No JVD. No LE edema. 2+ pedal pulses. Abdomen: Normoactive bowel sounds. No significant tenderness, non-distended, and no masses palpated. No hepatosplenomegaly. GU: No indwelling catheter Musculoskeletal: No clubbing / cyanosis. No joint deformity upper and lower extremities. Good ROM, no contractures. Normal muscle tone.  Skin: Warm, dry. No rashes, wounds, no ulcers. No significant lesions noted.  Neurologic: CN II-XII grossly intact. Gait not assessed. Speech normal. No focal deficits in motor strength or sensation in all extremities.  Psychiatric: Alert and oriented x3. Normal judgment and insight. Mood euthymic with broad affect.   Labs on Admission: I have personally reviewed following labs and imaging studies  CBC:  Recent Labs Lab 12/07/16 0835 12/07/16 0837  WBC 14.2*  --   HGB 6.4* 7.5*  HCT 20.1* 22.0*  MCV 94.4  --   PLT 324  --    Basic Metabolic Panel:  Recent Labs Lab 12/07/16 0835 12/07/16 0837  NA 134* 134*  K 4.6 4.6  CL 99* 100*  CO2 22  --   GLUCOSE 114* 113*  BUN 42* 37*  CREATININE 0.79 0.90  CALCIUM 8.3*  --    GFR: Estimated Creatinine Clearance: 58.2 mL/min (by C-G formula based on SCr of 0.9 mg/dL). Liver Function Tests:  Recent Labs Lab 12/07/16 0835  AST 145*  ALT 44  ALKPHOS 610*  BILITOT 2.2*  PROT 6.7  ALBUMIN 2.9*    Recent Labs Lab 12/07/16 0835  LIPASE 15   No results for input(s): AMMONIA in the last 168 hours. Coagulation Profile: No results for input(s): INR, PROTIME in the last 168 hours. Cardiac Enzymes: No results for  input(s): CKTOTAL, CKMB, CKMBINDEX, TROPONINI in the last 168 hours. BNP (last 3 results) No results for input(s): PROBNP in the last 8760 hours. HbA1C: No results for input(s): HGBA1C in the last 72 hours. CBG: No results for input(s): GLUCAP in the last 168 hours. Lipid Profile: No results for input(s): CHOL, HDL, LDLCALC, TRIG, CHOLHDL, LDLDIRECT in the last 72 hours. Thyroid Function Tests: No results for input(s): TSH, T4TOTAL, FREET4, T3FREE, THYROIDAB in the last 72  hours. Anemia Panel: No results for input(s): VITAMINB12, FOLATE, FERRITIN, TIBC, IRON, RETICCTPCT in the last 72 hours. Urine analysis:    Component Value Date/Time   COLORURINE YELLOW 10/25/2016 1649   APPEARANCEUR HAZY (A) 10/25/2016 1649   LABSPEC 1.013 10/25/2016 1649   PHURINE 6.0 10/25/2016 1649   GLUCOSEU NEGATIVE 10/25/2016 1649   HGBUR NEGATIVE 10/25/2016 1649   HGBUR negative 07/07/2007 0838   BILIRUBINUR NEGATIVE 10/25/2016 1649   KETONESUR NEGATIVE 10/25/2016 1649   PROTEINUR NEGATIVE 10/25/2016 1649   UROBILINOGEN 1.0 04/22/2015 0020   NITRITE NEGATIVE 10/25/2016 1649   LEUKOCYTESUR NEGATIVE 10/25/2016 1649   Sepsis Labs: @LABRCNTIP (procalcitonin:4,lacticidven:4) ) Recent Results (from the past 240 hour(s))  MRSA PCR Screening     Status: None   Collection Time: 12/07/16 10:45 AM  Result Value Ref Range Status   MRSA by PCR NEGATIVE NEGATIVE Final    Comment:        The GeneXpert MRSA Assay (FDA approved for NASAL specimens only), is one component of a comprehensive MRSA colonization surveillance program. It is not intended to diagnose MRSA infection nor to guide or monitor treatment for MRSA infections.      Radiological Exams on Admission: No results found.  Assessment/Plan Active Problems:   UGI bleed   Acute blood loss anemia:  - T&S, transfuse 1u PRBCs, recheck H&H. - Serial H&H  - Restart po iron eventually  Upper GI bleed: Related to known portal hypertensive  gastropathy/esophageal varices in setting of medication noncompliance  - Per GI, EGD planned - Octreotide, PPI IV BID  EtOH abuse: Not currently withdrawing. Does not wish to quit at this time. - CIWA - Thiamine, folate - Cessation counseling was provided. Pt precontemplative.   Asthma: No signs of exacerbation. - Continue formulary dulera - PRN bronchodilator  Hold other po medications pending EGD.  DVT prophylaxis: SCDs  Code Status: Full  Family Communication: None at bedside Disposition Plan: Uncertain Consults called: Gastroenterology, Dr. Penelope Coop  Admission status: Inpatient, SDU   Vance Gather, MD [facility] Pager [phone]  If 7PM-7AM, please contact night-coverage [email or web address] Password [email or web address] 12/07/2016, 2:50 PM

## 2016-12-07 NOTE — ED Notes (Signed)
Pt ICU charge RN pt may come up at 1030.

## 2016-12-07 NOTE — ED Notes (Signed)
Pt given mouthwash and swabs per request.

## 2016-12-07 NOTE — ED Notes (Signed)
Knott at bedside with [place] machine for IV attempt.

## 2016-12-07 NOTE — ED Notes (Signed)
Bed: [id number] Expected date:  Expected time:  Means of arrival:  Comments: 71 yo GI bleed

## 2016-12-07 NOTE — ED Notes (Signed)
Abnormal labs result MD Knott have been made aware

## 2016-12-07 NOTE — ED Notes (Signed)
This Probation officer attempt US guided IV twice unsuccessful. Knott verbalizes will attempt.

## 2016-12-07 NOTE — ED Provider Notes (Signed)
[place] DEPT Provider Note   CSN: [id number] Arrival date & time: 12/07/16  [id number]     History   Chief Complaint Chief Complaint  Patient presents with  . Blood In Stools    HPI [name] is a 71 y.o. female.  The history is provided by the patient.  GI Problem  This is a recurrent problem. The current episode started 6 to 12 hours ago. The problem occurs constantly. The problem has not changed since onset.Nothing aggravates the symptoms. Nothing relieves the symptoms. She has tried nothing for the symptoms.    Past Medical History:  Diagnosis Date  . Alcohol abuse   . Alcohol abuse   . Allergy   . Arthritis    back-severe, hips, right knee  . Asthma   . GERD (gastroesophageal reflux disease)    occasional  . H/O measles   . H/O mumps   . Hypercholesteremia    under control  . Insomnia   . Psoriasis (a type of skin inflammation)   . Renal cell carcinoma 2012   left  . Seasonal allergies     Patient Active Problem List   Diagnosis Date Noted  . Acute upper GI bleed 10/25/2016  . Esophageal varices ([place]) 10/25/2016  . Displaced oblique fracture of shaft of left femur ([place]) 04/13/2016  . Acute upper GI bleeding 04/05/2016  . Upper GI bleed 04/05/2016  . Rotator cuff tendinitis 03/11/2016  . Alcohol withdrawal ([place]) 01/04/2016  . Acute blood loss anemia 01/04/2016  . Tobacco abuse 01/03/2016  . Diarrhea 01/03/2016  . Depression 01/03/2016  . Alcohol abuse   . Chronic back pain 08/18/2015  . Seasonal allergies 07/05/2015  . Insomnia 07/05/2015  . Arthritis 07/05/2015  . Psoriasis 07/05/2015  . Menopausal hot flushes 06/01/2015  . Dyspareunia 06/01/2015  . Multiple falls 05/03/2015  . Encounter for medication review 05/03/2015  . Hot flashes 05/03/2015  . Herniated lumbar intervertebral disc 10/12/2014  . Generalized abdominal pain 09/25/2013  . Anemia of chronic disease 09/11/2013  . Asthma 04/24/2007  . Hyperlipidemia 01/04/2007    Past  Surgical History:  Procedure Laterality Date  . ABDOMINAL HYSTERECTOMY  40years ago  . BUNIONECTOMY  04/2011  . CHOLECYSTECTOMY  11/13/2011   Procedure: LAPAROSCOPIC CHOLECYSTECTOMY WITH INTRAOPERATIVE CHOLANGIOGRAM;  Surgeon: Judieth Keens, DO;  Location: [facility];  Service: General;  Laterality: N/A;  . COLONOSCOPY N/A 09/25/2013   Procedure: COLONOSCOPY;  Surgeon: Lear Ng, MD;  Location: [facility];  Service: Endoscopy;  Laterality: N/A;  . ESOPHAGOGASTRODUODENOSCOPY N/A 09/25/2013   Procedure: ESOPHAGOGASTRODUODENOSCOPY (EGD);  Surgeon: Lear Ng, MD;  Location: [facility];  Service: Endoscopy;  Laterality: N/A;  . ESOPHAGOGASTRODUODENOSCOPY N/A 04/07/2016   Procedure: ESOPHAGOGASTRODUODENOSCOPY (EGD);  Surgeon: Milus Banister, MD;  Location: [facility];  Service: Endoscopy;  Laterality: N/A;  . ESOPHAGOGASTRODUODENOSCOPY N/A 10/26/2016   Procedure: ESOPHAGOGASTRODUODENOSCOPY (EGD);  Surgeon: Arta Silence, MD;  Location: [facility];  Service: Endoscopy;  Laterality: N/A;  . ESOPHAGOGASTRODUODENOSCOPY (EGD) WITH PROPOFOL Left 01/03/2016   Procedure: ESOPHAGOGASTRODUODENOSCOPY (EGD) WITH PROPOFOL;  Surgeon: Arta Silence, MD;  Location: [facility];  Service: Endoscopy;  Laterality: Left;  . FEMUR IM NAIL Left 04/15/2016   Procedure: INTRAMEDULLARY (IM) RETROGRADE FEMORAL NAILING;  Surgeon: Gaynelle Arabian, MD;  Location: [facility];  Service: Orthopedics;  Laterality: Left;  . HERNIA REPAIR  [id number]   supraumbilical repair  . KIDNEY SURGERY  12/2010   [facility]; partial nephrectomy  . LUMBAR LAMINECTOMY/DECOMPRESSION MICRODISCECTOMY Right 10/12/2014   Procedure:  HEMI LAMINECTOMY MICRODISCECTOMY L5-S1 RIGHT (1 LEVEL);  Surgeon: Tobi Bastos, MD;  Location: [facility];  Service: Orthopedics;  Laterality: Right;  . MENISECTOMY  2010   left knee  . TUBAL LIGATION  44 years ago    OB History    Gravida Para Term Preterm AB Living   3 3       3    SAB TAB Ectopic  Multiple Live Births                   Home Medications    Prior to Admission medications   Medication Sig Start Date End Date Taking? Authorizing Provider  albuterol (PROVENTIL HFA;VENTOLIN HFA) 108 (90 Base) MCG/ACT inhaler Inhale 2 puffs into the lungs every 6 (six) hours as needed for wheezing or shortness of breath. 08/05/16   Golden Circle, FNP  cyclobenzaprine (FLEXERIL) 10 MG tablet Take 10 mg by mouth 3 (three) times daily as needed for muscle spasms.    Historical Provider, MD  docusate sodium (COLACE) 100 MG capsule Take 100 mg by mouth 2 (two) times daily as needed for mild constipation.    Historical Provider, MD  DULoxetine (CYMBALTA) 60 MG capsule Take 1 capsule (60 mg total) by mouth daily. 08/05/16   Golden Circle, FNP  famotidine (PEPCID) 20 MG tablet Take 20 mg by mouth 2 (two) times daily.    Historical Provider, MD  ferrous sulfate 325 (65 FE) MG tablet Take 1 tablet (325 mg total) by mouth 2 (two) times daily with a meal. 10/27/16   Janece Canterbury, MD  Fluticasone-Salmeterol (ADVAIR DISKUS) 500-50 MCG/DOSE AEPB INHALE 1 PUFF INTO THE LUNGS TWICE A DAY 08/01/16   Golden Circle, FNP  folic acid (FOLVITE) 1 MG tablet Take 1 tablet (1 mg total) by mouth daily. 04/18/16   Shanker Kristeen Mans, MD  LORazepam (ATIVAN) 1 MG tablet Take 1 tablet (1 mg total) by mouth 2 (two) times daily as needed for anxiety (Anxiety, shakiness. DO NOT USE WITH ALCOHOL.). 08/01/16   Golden Circle, FNP  magnesium oxide (MAG-OX) 400 MG tablet Take 400 mg by mouth daily as needed.    Historical Provider, MD  methocarbamol (ROBAXIN) 500 MG tablet Take 750 mg by mouth every 4 (four) hours as needed for muscle spasms.    Historical Provider, MD  montelukast (SINGULAIR) 10 MG tablet Take 1 tablet (10 mg total) by mouth at bedtime. 08/01/16   Golden Circle, FNP  Multiple Vitamin (MULTIVITAMIN WITH MINERALS) TABS tablet Take 1 tablet by mouth daily. 04/18/16   Shanker Kristeen Mans, MD  pantoprazole  (PROTONIX) 40 MG tablet Take 1 tablet (40 mg total) by mouth daily. 10/27/16   Janece Canterbury, MD  PARoxetine (PAXIL) 10 MG tablet Take 0.5 tablets (5 mg total) by mouth daily. 08/01/16   Golden Circle, FNP  polyethylene glycol (MIRALAX / GLYCOLAX) packet Take 17 g by mouth daily. 08/01/16   Golden Circle, FNP  potassium chloride SA (K-DUR,KLOR-CON) 20 MEQ tablet Take 1 tablet (20 mEq total) by mouth daily. Patient taking differently: Take 40 mEq by mouth daily.  08/05/16   Golden Circle, FNP  propranolol (INDERAL) 10 MG tablet Take 1 tablet (10 mg total) by mouth 2 (two) times daily. 10/27/16   Janece Canterbury, MD  simvastatin (ZOCOR) 40 MG tablet Take 1 tablet (40 mg total) by mouth every morning. 08/05/16   Golden Circle, FNP  sucralfate (CARAFATE) 1 g tablet Take 1 tablet at  bedtime on an empty stomach and before meals BID    Historical Provider, MD  thiamine 100 MG tablet Take 1 tablet (100 mg total) by mouth daily. 04/18/16   Shanker Kristeen Mans, MD  zolpidem (AMBIEN) 5 MG tablet Take 1 tablet (5 mg total) by mouth at bedtime as needed. for sleep 08/01/16   Golden Circle, FNP    Family History Family History  Problem Relation Age of Onset  . Hyperlipidemia Mother   . Hypertension Mother     Social History Social History  Substance Use Topics  . Smoking status: Current Some Day Smoker    Packs/day: 0.15    Years: 6.00    Types: Cigarettes  . Smokeless tobacco: Never Used  . Alcohol use 1.2 oz/week    1 Glasses of wine, 1 Shots of liquor per week     Comment: socially last drink 04/10/16      Allergies   Azithromycin and Morphine and related   Review of Systems Review of Systems  All other systems reviewed and are negative.    Physical Exam Updated Vital Signs BP 113/89 (BP Location: Left Arm)   Pulse 117   Temp 98.1 F (36.7 C) (Oral)   Resp 18   SpO2 100%   Physical Exam  Constitutional: She is oriented to person, place, and time. She appears  well-developed and well-nourished. No distress.  HENT:  Head: Normocephalic.  Nose: Nose normal.  Mouth/Throat: Oropharynx is clear and moist.  Eyes: Conjunctivae are normal.  Neck: Neck supple. No tracheal deviation present.  Cardiovascular: Regular rhythm.  Tachycardia present.   Pulmonary/Chest: Effort normal and breath sounds normal. No respiratory distress.  Abdominal: Soft. She exhibits no distension. There is tenderness in the epigastric area. There is no rigidity, no rebound and no guarding.  Genitourinary: Rectal exam shows guaiac positive stool (with melena).  Neurological: She is alert and oriented to person, place, and time.  Skin: Skin is warm and dry.  Psychiatric: She has a normal mood and affect.  Vitals reviewed.    ED Treatments / Results  Labs (all labs ordered are listed, but only abnormal results are displayed) Labs Reviewed  COMPREHENSIVE METABOLIC PANEL - Abnormal; Notable for the following:       Result Value   Sodium 134 (*)    Chloride 99 (*)    Glucose, Bld 114 (*)    BUN 42 (*)    Calcium 8.3 (*)    Albumin 2.9 (*)    AST 145 (*)    Alkaline Phosphatase 610 (*)    Total Bilirubin 2.2 (*)    All other components within normal limits  CBC - Abnormal; Notable for the following:    WBC 14.2 (*)    RBC 2.13 (*)    Hemoglobin 6.4 (*)    HCT 20.1 (*)    RDW 19.1 (*)    All other components within normal limits  POC OCCULT BLOOD, ED - Abnormal; Notable for the following:    Fecal Occult Bld POSITIVE (*)    All other components within normal limits  I-STAT CG4 LACTIC ACID, ED - Abnormal; Notable for the following:    Lactic Acid, Venous 4.27 (*)    All other components within normal limits  I-STAT CHEM 8, ED - Abnormal; Notable for the following:    Sodium 134 (*)    Chloride 100 (*)    BUN 37 (*)    Glucose, Bld 113 (*)    Calcium,  Ion 1.03 (*)    Hemoglobin 7.5 (*)    HCT 22.0 (*)    All other components within normal limits  ETHANOL    LIPASE, BLOOD  TYPE AND SCREEN  PREPARE RBC (CROSSMATCH)    EKG  EKG Interpretation None       Radiology Dg Chest Port 1 View  Result Date: 12/07/2016 CLINICAL DATA:  Tachycardia. EXAM: PORTABLE CHEST 1 VIEW COMPARISON:  02/28/2014 and 04/13/2016 FINDINGS: Lungs are adequately inflated and otherwise clear. Cardiomediastinal silhouette is within normal. There is minimal calcified plaque over the aortic arch. Remaining bones and soft tissues are within normal. IMPRESSION: No acute cardiopulmonary disease. Mild aortic atherosclerosis. Electronically Signed   By: Marin Olp M.D.   On: 12/07/2016 16:40    Procedures Procedures (including critical care time)  CRITICAL CARE Performed by: Leo Grosser Total critical care time: 30 minutes Critical care time was exclusive of separately billable procedures and treating other patients. Critical care was necessary to treat or prevent imminent or life-threatening deterioration. Critical care was time spent personally by me on the following activities: development of treatment plan with patient and/or surrogate as well as nursing, discussions with consultants, evaluation of patient's response to treatment, examination of patient, obtaining history from patient or surrogate, ordering and performing treatments and interventions, ordering and review of laboratory studies, ordering and review of radiographic studies, pulse oximetry and re-evaluation of patient's condition.  Medications Ordered in ED Medications  sodium chloride 0.9 % bolus 1,000 mL (0 mLs Intravenous Stopped 12/07/16 1012)    And  0.9 %  sodium chloride infusion ( Intravenous New Bag/Given 12/08/16 0630)  octreotide (SANDOSTATIN) 2 mcg/mL load via infusion 50 mcg (50 mcg Intravenous Bolus from Bag 12/07/16 0846)    And  octreotide (SANDOSTATIN) 500 mcg in sodium chloride 0.9 % 250 mL (2 mcg/mL) infusion (50 mcg/hr Intravenous Rate/Dose Verify 12/08/16 0700)  pantoprazole  (PROTONIX) 80 mg in sodium chloride 0.9 % 100 mL IVPB (80 mg Intravenous Given 12/07/16 2122)  mometasone-formoterol (DULERA) 200-5 MCG/ACT inhaler 2 puff (2 puffs Inhalation Given 12/07/16 1945)  sodium chloride flush (NS) 0.9 % injection 3 mL (3 mLs Intravenous Given 12/07/16 2122)  LORazepam (ATIVAN) injection 2-3 mg (2 mg Intravenous Given [id number])  folic acid injection 1 mg ( Intravenous MAR Unhold 12/07/16 1438)  thiamine (B-1) injection 100 mg ( Intravenous MAR Unhold 12/07/16 1438)  albuterol (PROVENTIL) (2.5 MG/3ML) 0.083% nebulizer solution 2.5 mg ( Nebulization MAR Unhold 12/07/16 1438)  0.9 %  sodium chloride infusion ( Intravenous Not Given 12/07/16 1800)  polyvinyl alcohol (LIQUIFILM TEARS) 1.4 % ophthalmic solution 1 drop (1 drop Both Eyes Given 12/07/16 2013)  zolpidem (AMBIEN) tablet 5 mg (5 mg Oral Given 12/07/16 2121)  pantoprazole (PROTONIX) 80 mg in sodium chloride 0.9 % 100 mL IVPB (0 mg Intravenous Stopped 12/07/16 0932)  cefTRIAXone (ROCEPHIN) 1 g in dextrose 5 % 50 mL IVPB (0 g Intravenous Stopped 12/07/16 0932)     Initial Impression / Assessment and Plan / ED Course  I have reviewed the triage vital signs and the nursing notes.  Pertinent labs & imaging results that were available during my care of the patient were reviewed by me and considered in my medical decision making (see chart for details).     71 year old female presents with bright red blood in her vomit with clots and melena starting overnight.Has admission from last month with multifactorial upper GI bleeding with portal gastropathy and esophageal varices. Had anemia to 4  during previous admission. The patient has been noncompliant with recommendations to C6 alcohol consumption and had multiple vodka and Kool-Aid drinks 2 days ago which she believes precipitated this event.   Has gross melanotic stool here and states she has been vomiting blood throughout the night. She is tachycardic but maintaining her blood  pressure currently. Fluid resuscitation started, prophylactic antibiotics administered and octreotide bolus with drip and IV Protonix orders placed.  Hb 6.4 will require urgent PRBC transfusion.  9:22 AM pages sent to [facility] gastroenterology on-call physician where she was supposed to have follow-up and hospitalist for admission with plan for emergent endoscopy for presumed variceal bleeding and blood loss anemia.  9:28 AM [facility] is coming to evaluate patient.   Hospitalist was consulted for admission and will see the patient in the emergency department for stepdown admission and emergent scope.   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

## 2016-12-07 NOTE — ED Triage Notes (Signed)
Per EMS pt hematemesis and blood in stool onset midnight; hx of same with stomach ulcers.

## 2016-12-07 NOTE — ED Notes (Signed)
rn AT [place]

## 2016-12-08 DIAGNOSIS — J45909 Unspecified asthma, uncomplicated: Secondary | ICD-10-CM

## 2016-12-08 DIAGNOSIS — E876 Hypokalemia: Secondary | ICD-10-CM

## 2016-12-08 DIAGNOSIS — K922 Gastrointestinal hemorrhage, unspecified: Secondary | ICD-10-CM

## 2016-12-08 DIAGNOSIS — Z720 Tobacco use: Secondary | ICD-10-CM

## 2016-12-08 DIAGNOSIS — F10231 Alcohol dependence with withdrawal delirium: Secondary | ICD-10-CM

## 2016-12-08 LAB — BASIC METABOLIC PANEL
Anion gap: 7 (ref 5–15)
BUN: 29 mg/dL — AB (ref 6–20)
CALCIUM: 7.7 mg/dL — AB (ref 8.9–10.3)
CO2: 23 mmol/L (ref 22–32)
Chloride: 109 mmol/L (ref 101–111)
Creatinine, Ser: 0.65 mg/dL (ref 0.44–1.00)
GFR calc Af Amer: >60 mL/min (ref >60)
GLUCOSE: 92 mg/dL (ref 65–99)
Potassium: 3.3 mmol/L — ABNORMAL LOW (ref 3.5–5.1)
Sodium: 139 mmol/L (ref 135–145)

## 2016-12-08 LAB — CBC
HEMATOCRIT: 33.7 % — AB (ref 36.0–46.0)
Hemoglobin: 10.6 g/dL — ABNORMAL LOW (ref 12.0–15.0)
MCH: 27.2 pg (ref 26.0–34.0)
MCHC: 31.5 g/dL (ref 30.0–36.0)
MCV: 86.4 fL (ref 78.0–100.0)
Platelets: 141 10*3/uL — ABNORMAL LOW (ref 150–400)
RBC: 3.9 MIL/uL (ref 3.87–5.11)
RDW: 18.6 % — ABNORMAL HIGH (ref 11.5–15.5)
WBC: 9.7 10*3/uL (ref 4.0–10.5)

## 2016-12-08 LAB — MAGNESIUM: MAGNESIUM: 1.3 mg/dL — AB (ref 1.7–2.4)

## 2016-12-08 MED ORDER — DIPHENHYDRAMINE HCL 25 MG PO CAPS
25.0000 mg | ORAL_CAPSULE | Freq: Four times a day (QID) | ORAL | Status: DC | PRN
  Administered 2016-12-08: 25 mg via ORAL
  Filled 2016-12-08: qty 1

## 2016-12-08 MED ORDER — POTASSIUM CHLORIDE 10 MEQ/100ML IV SOLN
10.0000 meq | INTRAVENOUS | Status: AC
  Administered 2016-12-08 (×3): 10 meq via INTRAVENOUS
  Filled 2016-12-08 (×3): qty 100

## 2016-12-08 MED ORDER — SODIUM CHLORIDE 0.9 % IV SOLN: 30.0000 meq | Freq: Once | INTRAVENOUS | Status: DC

## 2016-12-08 MED ORDER — DEXMEDETOMIDINE HCL IN NACL 200 MCG/50ML IV SOLN
0.2000 ug/kg/h | INTRAVENOUS | Status: DC
  Administered 2016-12-08 – 2016-12-09 (×4): 0.5 ug/kg/h via INTRAVENOUS
  Filled 2016-12-08 (×4): qty 50

## 2016-12-08 MED ORDER — LORAZEPAM 2 MG/ML IJ SOLN
2.0000 mg | Freq: Four times a day (QID) | INTRAMUSCULAR | Status: DC
  Administered 2016-12-08 – 2016-12-09 (×3): 2 mg via INTRAVENOUS
  Filled 2016-12-08 (×3): qty 1

## 2016-12-08 NOTE — Progress Notes (Signed)
PROGRESS NOTE    [name]  [id number] DOB: [birth year] DOA: 12/07/2016 PCP: No primary care provider on file.    Brief Narrative:  [name] is a 71 y.o. female with a history of GI bleeding and alcohol abuse presenting for melena and hematemesis. She reports abrupt onset yesterday evening of frequent bloody emesis and dark stools associated with nausea and abdominal discomfort that is moderate, diffuse, worse with emesis. Today she noticed severe lightheadedness and fatigue, in addition to ongoing hematemesis, approximately 5-6 episodes in past 12 hours and 2 small volume dark stools.   She has not been compliant with medications and reports drinking a half gallon of vodka every 2-3 days, which she hides from her husband who believes she has a drinking problem. She stopped drinking within the past few months for a few weeks and noted mild tremor without seizures. Started back drinking due to stress.   ED Course: On arrival she was tachycardic at 118bpm, BP 113/89 and afebrile, 100% on room air. Hgb 6.4, down from 10.8 on 12/17, and FOBT was positive. GI called for evaluation and [facility] asked to admit.    Assessment & Plan:   Principal Problem:   UGI bleed Active Problems:   Asthma   Alcohol abuse   Tobacco abuse   Depression   Acute blood loss anemia   Acute blood loss anemia:  - T&S, transfused 1u PRBCs - H/H improved - EGD showing esophageal varices and portal hypertensive gastropathy - Serial H&H  - Restart po iron when able to tolerate PO and diet advanced by GI - can decrease IVF to 60ml/hr as patient vital signs stable  Upper GI bleed:  - Related to known portal hypertensive gastropathy/esophageal varices in setting of medication noncompliance and alcoholism - Octreotide, PPI IV BID - recurrent admissions for this - patient states she has no desire to stop drinking  EtOH abuse:  - beginning to show signs of withdrawal - Repeatedly states she does not wish to  quit at this time. - CIWA (last score of 11) - Thiamine, folate - Cessation counseling was provided but patient not interested in pursuing cessation options  Asthma:  - On room air - Continue formulary dulera - PRN bronchodilator  Hypokalemia - replete with IV potassium - check magnesium level   DVT prophylaxis: SCDs  Code Status: Full  Family Communication: None at bedside Disposition Plan: Will likely discharge home after detoxing although patient already stating she is going to leave today which would be AMA as she feels better and is no longer bleeding   Consultants:   Gastroenterology, Dr. Penelope Coop  Procedures:   EGD  Antimicrobials:   None    Subjective: Patient seen this am.  She reports she feels much better today except for excessive itching that she has.  Mentions that she believes she is ready to leave the hospital today.  Discussed alcohol cessation to which patient reports that she does not wish to stop drinking as "there is nothing good to drink without alcohol in it".  Discussed recurrent repeated admissions for GI bleeding to which patient states is not attributed to her alcoholism.  Voices concerns about needing something for itching.  Wants to eat a regular diet because she says she has not eaten since Thursday (4 days prior).  Objective: Vitals:   12/08/16 0200 12/08/16 0400 12/08/16 0600 12/08/16 0800  BP: 134/73 125/88 (!) 131/91   Pulse: 93 83 84   Resp: 18 17 16  Temp:  98.3 F (36.8 C)  98.1 F (36.7 C)  TempSrc:  Oral  Oral  SpO2: 100% 100% 99%   Weight:      Height:        Intake/Output Summary (Last 24 hours) at 12/08/16 0832 Last data filed at 12/08/16 [id number]  Gross per 24 hour  Intake             5530 ml  Output             1352 ml  Net             4178 ml   Filed Weights   12/07/16 1051  Weight: 70.3 kg (154 lb 15.7 oz)    Examination:  General exam: Appears agitated- moving around bed and scratching herself Respiratory  system: Clear to auscultation. Respiratory effort normal. Cardiovascular system: S1 & S2 heard, RRR. No JVD, murmurs, rubs, gallops or clicks. No pedal edema. Gastrointestinal system: Abdomen is nondistended, soft and nontender. No organomegaly or masses felt. Normal bowel sounds heard. Central nervous system: Alert and oriented. No focal neurological deficits. Extremities: Symmetric 5 x 5 power. Skin: Numerous scratch marks on skin Psychiatry: Limited insight into medical problems and alcoholism. Mood & affect appropriate.     Data Reviewed: I have personally reviewed following labs and imaging studies  CBC:  Recent Labs Lab 12/07/16 0835 12/07/16 0837 12/07/16 1523 12/08/16 0123 12/08/16 0654  WBC 14.2*  --  14.3* 12.0* 9.7  HGB 6.4* 7.5* 7.1* 10.4* 10.6*  HCT 20.1* 22.0* 22.4* 29.8* 33.7*  MCV 94.4  --  88.2 86.6 86.4  PLT 324  --  239 212 [id number]*   Basic Metabolic Panel:  Recent Labs Lab 12/07/16 0835 12/07/16 0837 12/08/16 0123  NA 134* 134* 139  K 4.6 4.6 3.3*  CL 99* 100* 109  CO2 22  --  23  GLUCOSE 114* 113* 92  BUN 42* 37* 29*  CREATININE 0.79 0.90 0.65  CALCIUM 8.3*  --  7.7*   GFR: Estimated Creatinine Clearance: 65.5 mL/min (by C-G formula based on SCr of 0.65 mg/dL). Liver Function Tests:  Recent Labs Lab 12/07/16 0835  AST 145*  ALT 44  ALKPHOS 610*  BILITOT 2.2*  PROT 6.7  ALBUMIN 2.9*    Recent Labs Lab 12/07/16 0835  LIPASE 15   No results for input(s): AMMONIA in the last 168 hours. Coagulation Profile: No results for input(s): INR, PROTIME in the last 168 hours. Cardiac Enzymes: No results for input(s): CKTOTAL, CKMB, CKMBINDEX, TROPONINI in the last 168 hours. BNP (last 3 results) No results for input(s): PROBNP in the last 8760 hours. HbA1C: No results for input(s): HGBA1C in the last 72 hours. CBG: No results for input(s): GLUCAP in the last 168 hours. Lipid Profile: No results for input(s): CHOL, HDL, LDLCALC, TRIG,  CHOLHDL, LDLDIRECT in the last 72 hours. Thyroid Function Tests: No results for input(s): TSH, T4TOTAL, FREET4, T3FREE, THYROIDAB in the last 72 hours. Anemia Panel: No results for input(s): VITAMINB12, FOLATE, FERRITIN, TIBC, IRON, RETICCTPCT in the last 72 hours. Sepsis Labs:  Recent Labs Lab 12/07/16 [id number]  LATICACIDVEN 4.27*    Recent Results (from the past 240 hour(s))  MRSA PCR Screening     Status: None   Collection Time: 12/07/16 10:45 AM  Result Value Ref Range Status   MRSA by PCR NEGATIVE NEGATIVE Final    Comment:        The GeneXpert MRSA Assay (FDA approved for NASAL specimens only),  is one component of a comprehensive MRSA colonization surveillance program. It is not intended to diagnose MRSA infection nor to guide or monitor treatment for MRSA infections.          Radiology Studies: Dg Chest Port 1 View  Result Date: 12/07/2016 CLINICAL DATA:  Tachycardia. EXAM: PORTABLE CHEST 1 VIEW COMPARISON:  02/28/2014 and 04/13/2016 FINDINGS: Lungs are adequately inflated and otherwise clear. Cardiomediastinal silhouette is within normal. There is minimal calcified plaque over the aortic arch. Remaining bones and soft tissues are within normal. IMPRESSION: No acute cardiopulmonary disease. Mild aortic atherosclerosis. Electronically Signed   By: Marin Olp M.D.   On: 12/07/2016 16:40        Scheduled Meds: . sodium chloride   Intravenous Once  . folic acid  1 mg Intravenous Daily  . mometasone-formoterol  2 puff Inhalation BID  . pantoprazole (PROTONIX) IVPB  80 mg Intravenous BID  . sodium chloride flush  3 mL Intravenous Q12H  . thiamine  100 mg Intravenous Daily   Continuous Infusions: . sodium chloride 125 mL/hr at 12/08/16 0630  . octreotide  (SANDOSTATIN)    IV infusion 50 mcg/hr (12/08/16 0738)     LOS: 1 day    Time spent: 30 minutes    Loretha Stapler, MD [facility] Pager [phone]  If 7PM-7AM, please contact  night-coverage [email or web address] Password [facility] 12/08/2016, 8:32 AM

## 2016-12-08 NOTE — Consult Note (Signed)
PULMONARY / CRITICAL CARE MEDICINE   Name: [name] MRN: [id number] DOB: 1946-02-29    ADMISSION DATE:  12/07/2016 CONSULTATION DATE:  12/08/2016  REFERRING MD:  Adair Patter  CHIEF COMPLAINT:  GI bleed  HISTORY OF PRESENT ILLNESS:   71 year old female with a past medical history significant for alcohol abuse was admitted on 12/07/2016 with acute upper GI bleeding. She had an endoscopy on the day of admission which showed grade 1 esophageal varices and portal hypertensive gastropathy. She was admitted to the intensive care unit for further observation. Overnight she has not had any more hematemesis but she has developed increasing confusion. She has been impulsive and has tried to get out of bed. She was administered Ativan twice this morning but despite this she has been incontinent of stool and urine and has been difficult to redirect. Pulmonary critical care medicine was consulted for consideration of administration of other medications in the setting of her alcohol withdrawal. [name] that she has some back pain. She is oriented to [facility] long in the year 2018.  PAST MEDICAL HISTORY :  She  has a past medical history of Alcohol abuse; Alcohol abuse; Allergy; Arthritis; Asthma; GERD (gastroesophageal reflux disease); H/O measles; H/O mumps; Hypercholesteremia; Insomnia; Psoriasis (a type of skin inflammation); Renal cell carcinoma (2012); and Seasonal allergies.  PAST SURGICAL HISTORY: She  has a past surgical history that includes Kidney surgery (12/2010); Hernia repair (07/2011); Bunionectomy (04/2011); Menisectomy (2010); Cholecystectomy (11/13/2011); Esophagogastroduodenoscopy (N/A, 09/25/2013); Colonoscopy (N/A, 09/25/2013); Abdominal hysterectomy (40years ago); Tubal ligation (44 years ago); Lumbar laminectomy/decompression microdiscectomy (Right, 10/12/2014); Esophagogastroduodenoscopy (egd) with propofol (Left, 01/03/2016); Esophagogastroduodenoscopy (N/A, 04/07/2016); Femur IM nail  (Left, 04/15/2016); and Esophagogastroduodenoscopy (N/A, 10/26/2016).  Allergies  Allergen Reactions  . Azithromycin Itching and Swelling  . Morphine And Related Itching    No current facility-administered medications on file prior to encounter.    Current Outpatient Prescriptions on File Prior to Encounter  Medication Sig  . albuterol (PROVENTIL HFA;VENTOLIN HFA) 108 (90 Base) MCG/ACT inhaler Inhale 2 puffs into the lungs every 6 (six) hours as needed for wheezing or shortness of breath.  . cyclobenzaprine (FLEXERIL) 10 MG tablet Take 10 mg by mouth 3 (three) times daily as needed for muscle spasms.  Marland Kitchen docusate sodium (COLACE) 100 MG capsule Take 100 mg by mouth 2 (two) times daily as needed for mild constipation.  . DULoxetine (CYMBALTA) 60 MG capsule Take 1 capsule (60 mg total) by mouth daily.  . famotidine (PEPCID) 20 MG tablet Take 20 mg by mouth 2 (two) times daily.  . ferrous sulfate 325 (65 FE) MG tablet Take 1 tablet (325 mg total) by mouth 2 (two) times daily with a meal.  . Fluticasone-Salmeterol (ADVAIR DISKUS) 500-50 MCG/DOSE AEPB INHALE 1 PUFF INTO THE LUNGS TWICE A DAY  . folic acid (FOLVITE) 1 MG tablet Take 1 tablet (1 mg total) by mouth daily.  Marland Kitchen LORazepam (ATIVAN) 1 MG tablet Take 1 tablet (1 mg total) by mouth 2 (two) times daily as needed for anxiety (Anxiety, shakiness. DO NOT USE WITH ALCOHOL.).  Marland Kitchen magnesium oxide (MAG-OX) 400 MG tablet Take 400 mg by mouth daily as needed.  . methocarbamol (ROBAXIN) 500 MG tablet Take 750 mg by mouth every 4 (four) hours as needed for muscle spasms.  . montelukast (SINGULAIR) 10 MG tablet Take 1 tablet (10 mg total) by mouth at bedtime.  . Multiple Vitamin (MULTIVITAMIN WITH MINERALS) TABS tablet Take 1 tablet by mouth daily.  . pantoprazole (PROTONIX)  40 MG tablet Take 1 tablet (40 mg total) by mouth daily.  Marland Kitchen PARoxetine (PAXIL) 10 MG tablet Take 0.5 tablets (5 mg total) by mouth daily.  . polyethylene glycol (MIRALAX / GLYCOLAX)  packet Take 17 g by mouth daily.  . potassium chloride SA (K-DUR,KLOR-CON) 20 MEQ tablet Take 1 tablet (20 mEq total) by mouth daily.  . propranolol (INDERAL) 10 MG tablet Take 1 tablet (10 mg total) by mouth 2 (two) times daily.  . simvastatin (ZOCOR) 40 MG tablet Take 1 tablet (40 mg total) by mouth every morning.  . sucralfate (CARAFATE) 1 g tablet Take 1 g by mouth 4 (four) times daily. Take 1 tablet at bedtime on an empty stomach and before meals BID   . thiamine 100 MG tablet Take 1 tablet (100 mg total) by mouth daily.  Marland Kitchen zolpidem (AMBIEN) 5 MG tablet Take 1 tablet (5 mg total) by mouth at bedtime as needed. for sleep (Patient not taking: Reported on 12/07/2016)    FAMILY HISTORY:  Her indicated that her mother is alive. She indicated that her father is alive. She indicated that her maternal grandmother is deceased. She indicated that her maternal grandfather is deceased. She indicated that her paternal grandmother is deceased. She indicated that her paternal grandfather is deceased.    SOCIAL HISTORY: She  reports that she has been smoking Cigarettes.  She has a 0.90 pack-year smoking history. She has never used smokeless tobacco. She reports that she drinks about 1.2 oz of alcohol per week . She reports that she does not use drugs.  REVIEW OF SYSTEMS:   Cannot obtain due to confusion  SUBJECTIVE:  As above  VITAL SIGNS: BP (!) 122/59   Pulse (!) 101   Temp 98.1 F (36.7 C) (Oral)   Resp 20   Ht 5\' 7"  (1.702 m)   Wt 154 lb 15.7 oz (70.3 kg)   SpO2 96%   BMI 24.27 kg/m   HEMODYNAMICS:    VENTILATOR SETTINGS:    INTAKE / OUTPUT: I/O last 3 completed shifts: In: [id number] [I.V.:3125; Blood:1155; IV Piggyback:1250] Out: 40 [Urine:951; Emesis/NG output:100; Stool:1]  PHYSICAL EXAMINATION: General:  Awake, reaching in the air Neuro:  Awake, alert, oriented to place and year, however speech is tangential, she is reaching in the air for things that are not there, moves all  4 extremities well HEENT:  Normocephalic atraumatic, oropharynx clear Cardiovascular:  Regular rate and rhythm no murmurs gallops or rubs Lungs:  Clear to auscultation bilaterally Abdomen:  Bowel sounds positive nontender nondistended Musculoskeletal:  Normal bulk and tone Skin:  No rash or skin breakdown  LABS:  BMET  Recent Labs Lab 12/07/16 0835 12/07/16 0837 12/08/16 0123  NA 134* 134* 139  K 4.6 4.6 3.3*  CL 99* 100* 109  CO2 22  --  23  BUN 42* 37* 29*  CREATININE 0.79 0.90 0.65  GLUCOSE 114* 113* 92    Electrolytes  Recent Labs Lab 12/07/16 0835 12/08/16 0123 12/08/16 0126  CALCIUM 8.3* 7.7*  --   MG  --   --  1.3*    CBC  Recent Labs Lab 12/07/16 1523 12/08/16 0123 12/08/16 0654  WBC 14.3* 12.0* 9.7  HGB 7.1* 10.4* 10.6*  HCT 22.4* 29.8* 33.7*  PLT 239 212 141*    Coag's No results for input(s): APTT, INR in the last 168 hours.  Sepsis Markers  Recent Labs Lab 12/07/16 0837  LATICACIDVEN 4.27*    ABG No results for input(s): PHART,  PCO2ART, PO2ART in the last 168 hours.  Liver Enzymes  Recent Labs Lab 12/07/16 0835  AST 145*  ALT 44  ALKPHOS 610*  BILITOT 2.2*  ALBUMIN 2.9*    Cardiac Enzymes No results for input(s): TROPONINI, PROBNP in the last 168 hours.  Glucose No results for input(s): GLUCAP in the last 168 hours.  Imaging Dg Chest Port 1 View  Result Date: 12/07/2016 CLINICAL DATA:  Tachycardia. EXAM: PORTABLE CHEST 1 VIEW COMPARISON:  02/28/2014 and 04/13/2016 FINDINGS: Lungs are adequately inflated and otherwise clear. Cardiomediastinal silhouette is within normal. There is minimal calcified plaque over the aortic arch. Remaining bones and soft tissues are within normal. IMPRESSION: No acute cardiopulmonary disease. Mild aortic atherosclerosis. Electronically Signed   By: Marin Olp M.D.   On: 12/07/2016 16:40     STUDIES:  Endoscopy of the esophagus, stomach, duodenum 12/07/2016: Grade 1 esophageal varices,  portal hypertensive gastropathy  DISCUSSION: 71 year old female with a past medical history significant for alcohol abuse was admitted for an upper GI bleed in the setting of what appears to be portal hypertensive gastropathy. Pulmonary and critical care medicine was consulted for acute encephalopathy in the setting of presumed alcohol withdrawal. She currently requires ICU level care and close monitoring for her safety.  ASSESSMENT / PLAN:   GASTROINTESTINAL A:   Upper GI bleed Cirrhosis, alcoholic Esophageal varices Portal hypertensive gastropathy P:   Continue octreotide Continue to monitor for bleeding Monitor CBC Follow-up gastroenterology recommendations   NEUROLOGIC A:   Acute encephalopathy in setting of alcohol withdrawal Alcohol abuse, now delirium tremens P:   RASS goal: 0 Precedex per ICU withdrawal and fusion Increase scheduled benzodiazepine rate Hopefully off of Precedex by tomorrow morning Continue ICU level care  My critical care time 31 minutes  Discussed with bedside nursing and attending physician  Roselie Awkward, MD [facility] Pager: [phone] Cell: [phone] After 3pm or if no response, call [phone]  12/08/2016, 1:08 PM

## 2016-12-08 NOTE — Progress Notes (Signed)
Nutrition Brief Note  Patient identified on the Malnutrition Screening Tool (MST) Report  Patient's weight is stable. Pt reporting ready for a regular diet. Now on full liquids.   Wt Readings from Last 15 Encounters:  12/07/16 154 lb 15.7 oz (70.3 kg)  10/25/16 148 lb 2.4 oz (67.2 kg)  04/14/16 140 lb (63.5 kg)  04/05/16 150 lb (68 kg)  03/11/16 152 lb (68.9 kg)  01/19/16 148 lb 12 oz (67.5 kg)  01/04/16 154 lb 1.6 oz (69.9 kg)  12/11/15 145 lb (65.8 kg)  10/25/15 147 lb 12.8 oz (67 kg)  09/08/15 154 lb (69.9 kg)  09/06/15 145 lb (65.8 kg)  08/18/15 133 lb 12.8 oz (60.7 kg)  07/24/15 144 lb (65.3 kg)  07/05/15 145 lb (65.8 kg)  06/01/15 143 lb (64.9 kg)    Body mass index is 24.27 kg/m. Patient meets criteria for normal based on current BMI.   Current diet order is FLD. Diet just advanced. Labs and medications reviewed.   No nutrition interventions warranted at this time. If nutrition issues arise, please consult RD.   Clayton Bibles, MS, RD, LDN Pager: [phone] After Hours Pager: [phone]

## 2016-12-08 NOTE — Progress Notes (Signed)
[facility] Progress Note  Subjective: No signs of GI bleeding overnight. I discussed findings of EGD with patient.  Objective: Vital signs in last 24 hours: Temp:  [98.1 F (36.7 C)-99.4 F (37.4 C)] 98.1 F (36.7 C) (01/28 0800) Pulse Rate:  [83-120] 101 (01/28 1000) Resp:  [12-23] 20 (01/28 1200) BP: (95-150)/(55-94) 122/59 (01/28 1000) SpO2:  [94 %-100 %] 96 % (01/28 1200) Weight change:    PE:  No distress  Heart regular rhythm  Lungs clear  Abdomen soft and nontender  Lab Results: Results for orders placed or performed during the hospital encounter of 12/07/16 (from the past 24 hour(s))  CBC     Status: Abnormal   Collection Time: 12/07/16  3:23 PM  Result Value Ref Range   WBC 14.3 (H) 4.0 - 10.5 K/uL   RBC 2.54 (L) 3.87 - 5.11 MIL/uL   Hemoglobin 7.1 (L) 12.0 - 15.0 g/dL   HCT 22.4 (L) 36.0 - 46.0 %   MCV 88.2 78.0 - 100.0 fL   MCH 28.0 26.0 - 34.0 pg   MCHC 31.7 30.0 - 36.0 g/dL   RDW 20.0 (H) 11.5 - 15.5 %   Platelets 239 150 - 400 K/uL  Prepare RBC     Status: None   Collection Time: 12/07/16  4:49 PM  Result Value Ref Range   Order Confirmation ORDER PROCESSED BY BLOOD BANK   Basic metabolic panel     Status: Abnormal   Collection Time: 12/08/16  1:23 AM  Result Value Ref Range   Sodium 139 135 - 145 mmol/L   Potassium 3.3 (L) 3.5 - 5.1 mmol/L   Chloride 109 101 - 111 mmol/L   CO2 23 22 - 32 mmol/L   Glucose, Bld 92 65 - 99 mg/dL   BUN 29 (H) 6 - 20 mg/dL   Creatinine, Ser 0.65 0.44 - 1.00 mg/dL   Calcium 7.7 (L) 8.9 - 10.3 mg/dL   GFR calc non Af Amer >60 >60 mL/min   GFR calc Af Amer >60 >60 mL/min   Anion gap 7 5 - 15  CBC     Status: Abnormal   Collection Time: 12/08/16  1:23 AM  Result Value Ref Range   WBC 12.0 (H) 4.0 - 10.5 K/uL   RBC 3.44 (L) 3.87 - 5.11 MIL/uL   Hemoglobin 10.4 (L) 12.0 - 15.0 g/dL   HCT 29.8 (L) 36.0 - 46.0 %   MCV 86.6 78.0 - 100.0 fL   MCH 30.2 26.0 - 34.0 pg   MCHC 34.9 30.0 - 36.0 g/dL   RDW  18.1 (H) 11.5 - 15.5 %   Platelets 212 150 - 400 K/uL  Magnesium     Status: Abnormal   Collection Time: 12/08/16  1:26 AM  Result Value Ref Range   Magnesium 1.3 (L) 1.7 - 2.4 mg/dL  CBC     Status: Abnormal   Collection Time: 12/08/16  6:54 AM  Result Value Ref Range   WBC 9.7 4.0 - 10.5 K/uL   RBC 3.90 3.87 - 5.11 MIL/uL   Hemoglobin 10.6 (L) 12.0 - 15.0 g/dL   HCT 33.7 (L) 36.0 - 46.0 %   MCV 86.4 78.0 - 100.0 fL   MCH 27.2 26.0 - 34.0 pg   MCHC 31.5 30.0 - 36.0 g/dL   RDW 18.6 (H) 11.5 - 15.5 %   Platelets 141 (L) 150 - 400 K/uL    Studies/Results: Dg Chest Port 1 View  Result Date: 12/07/2016 CLINICAL DATA:  Tachycardia. EXAM: PORTABLE CHEST 1 VIEW COMPARISON:  02/28/2014 and 04/13/2016 FINDINGS: Lungs are adequately inflated and otherwise clear. Cardiomediastinal silhouette is within normal. There is minimal calcified plaque over the aortic arch. Remaining bones and soft tissues are within normal. IMPRESSION: No acute cardiopulmonary disease. Mild aortic atherosclerosis. Electronically Signed   By: Marin Olp M.D.   On: 12/07/2016 16:40      Assessment: Upper GI bleed. EGD done yesterday (see results)  Alcohol abuse  Plan:   Advance diet to full liquids today. Watch for further signs of bleeding. Continue PPI and octreotide     Cassell Clement 12/08/2016, 12:45 PM  Pager: [phone] If no answer or after 5 PM call [phone] Lab Results  Component Value Date   HGB 10.6 (L) 12/08/2016   HGB 10.4 (L) 12/08/2016   HGB 7.1 (L) 12/07/2016   HCT 33.7 (L) 12/08/2016   HCT 29.8 (L) 12/08/2016   HCT 22.4 (L) 12/07/2016   ALKPHOS 610 (H) 12/07/2016   ALKPHOS 325 (H) 10/26/2016   ALKPHOS 384 (H) 10/25/2016   AST 145 (H) 12/07/2016   AST 28 10/26/2016   AST 34 10/25/2016   ALT 44 12/07/2016   ALT 12 (L) 10/26/2016   ALT 15 10/25/2016

## 2016-12-09 ENCOUNTER — Encounter (HOSPITAL_COMMUNITY): Payer: Self-pay | Admitting: Gastroenterology

## 2016-12-09 DIAGNOSIS — I8500 Esophageal varices without bleeding: Secondary | ICD-10-CM

## 2016-12-09 DIAGNOSIS — F10929 Alcohol use, unspecified with intoxication, unspecified: Secondary | ICD-10-CM

## 2016-12-09 DIAGNOSIS — D62 Acute posthemorrhagic anemia: Secondary | ICD-10-CM

## 2016-12-09 DIAGNOSIS — R0682 Tachypnea, not elsewhere classified: Secondary | ICD-10-CM

## 2016-12-09 DIAGNOSIS — F329 Major depressive disorder, single episode, unspecified: Secondary | ICD-10-CM

## 2016-12-09 DIAGNOSIS — F1010 Alcohol abuse, uncomplicated: Secondary | ICD-10-CM

## 2016-12-09 LAB — TYPE AND SCREEN
BLOOD PRODUCT EXPIRATION DATE: 201802212359
BLOOD PRODUCT EXPIRATION DATE: 201802212359
Blood Product Expiration Date: 201802212359
ISSUE DATE / TIME: 201801271658
ISSUE DATE / TIME: 201801271056
ISSUE DATE / TIME: 201801272020
UNIT TYPE AND RH: 5100
UNIT TYPE AND RH: 5100
Unit Type and Rh: 5100

## 2016-12-09 LAB — CBC
HCT: 29.8 % — ABNORMAL LOW (ref 36.0–46.0)
HCT: 29.4 % — ABNORMAL LOW (ref 36.0–46.0)
Hemoglobin: 10.4 g/dL — ABNORMAL LOW (ref 12.0–15.0)
Hemoglobin: 9.7 g/dL — ABNORMAL LOW (ref 12.0–15.0)
MCH: 30.2 pg (ref 26.0–34.0)
MCH: 29.8 pg (ref 26.0–34.0)
MCHC: 34.9 g/dL (ref 30.0–36.0)
MCHC: 33 g/dL (ref 30.0–36.0)
MCV: 86.6 fL (ref 78.0–100.0)
MCV: 90.5 fL (ref 78.0–100.0)
PLATELETS: 212 10*3/uL (ref 150–400)
PLATELETS: 206 10*3/uL (ref 150–400)
RBC: 3.44 MIL/uL — AB (ref 3.87–5.11)
RBC: 3.25 MIL/uL — ABNORMAL LOW (ref 3.87–5.11)
RDW: 18.1 % — ABNORMAL HIGH (ref 11.5–15.5)
RDW: 19.2 % — AB (ref 11.5–15.5)
WBC: 12 10*3/uL — AB (ref 4.0–10.5)
WBC: 11.7 10*3/uL — ABNORMAL HIGH (ref 4.0–10.5)

## 2016-12-09 LAB — BASIC METABOLIC PANEL
Anion gap: 9 (ref 5–15)
BUN: 7 mg/dL (ref 6–20)
CALCIUM: 8.3 mg/dL — AB (ref 8.9–10.3)
CO2: 20 mmol/L — AB (ref 22–32)
CREATININE: 0.48 mg/dL (ref 0.44–1.00)
Chloride: 112 mmol/L — ABNORMAL HIGH (ref 101–111)
Glucose, Bld: 108 mg/dL — ABNORMAL HIGH (ref 65–99)
Potassium: 4.9 mmol/L (ref 3.5–5.1)
Sodium: 141 mmol/L (ref 135–145)

## 2016-12-09 LAB — C DIFFICILE QUICK SCREEN W PCR REFLEX
C Diff antigen: POSITIVE — AB
C Diff toxin: NEGATIVE

## 2016-12-09 LAB — CLOSTRIDIUM DIFFICILE BY PCR: Toxigenic C. Difficile by PCR: POSITIVE — AB

## 2016-12-09 LAB — MAGNESIUM: Magnesium: 1.3 mg/dL — ABNORMAL LOW (ref 1.7–2.4)

## 2016-12-09 MED ORDER — FERROUS SULFATE 325 (65 FE) MG PO TABS
325.0000 mg | ORAL_TABLET | Freq: Two times a day (BID) | ORAL | Status: DC
  Administered 2016-12-09 – 2016-12-14 (×9): 325 mg via ORAL
  Filled 2016-12-09 (×9): qty 1

## 2016-12-09 MED ORDER — DIPHENHYDRAMINE HCL 25 MG PO CAPS
25.0000 mg | ORAL_CAPSULE | Freq: Four times a day (QID) | ORAL | Status: AC | PRN
  Administered 2016-12-09 – 2016-12-10 (×2): 25 mg via ORAL
  Filled 2016-12-09 (×2): qty 1

## 2016-12-09 MED ORDER — LORAZEPAM 2 MG/ML IJ SOLN
1.0000 mg | INTRAMUSCULAR | Status: DC | PRN
  Administered 2016-12-10: 4 mg via INTRAVENOUS
  Administered 2016-12-10 (×2): 2 mg via INTRAVENOUS
  Filled 2016-12-09 (×2): qty 1
  Filled 2016-12-09: qty 2

## 2016-12-09 MED ORDER — VANCOMYCIN 50 MG/ML ORAL SOLUTION
125.0000 mg | Freq: Four times a day (QID) | ORAL | Status: DC
  Administered 2016-12-09 – 2016-12-14 (×17): 125 mg via ORAL
  Filled 2016-12-09 (×21): qty 2.5

## 2016-12-09 MED ORDER — METHOCARBAMOL 500 MG PO TABS
750.0000 mg | ORAL_TABLET | ORAL | Status: DC | PRN
  Administered 2016-12-09 – 2016-12-14 (×6): 750 mg via ORAL
  Filled 2016-12-09 (×6): qty 2

## 2016-12-09 MED ORDER — TRAMADOL HCL 50 MG PO TABS
50.0000 mg | ORAL_TABLET | Freq: Four times a day (QID) | ORAL | Status: DC | PRN
  Administered 2016-12-09 – 2016-12-10 (×3): 50 mg via ORAL
  Filled 2016-12-09 (×3): qty 1

## 2016-12-09 MED ORDER — LORAZEPAM 1 MG PO TABS
1.0000 mg | ORAL_TABLET | ORAL | Status: DC | PRN
  Administered 2016-12-09: 1 mg via ORAL
  Administered 2016-12-09: 2 mg via ORAL
  Administered 2016-12-09: 4 mg via ORAL
  Filled 2016-12-09 (×4): qty 2

## 2016-12-09 NOTE — Progress Notes (Signed)
Pt is extremely agitated and irritated tonight, her CIWA score is elevated, BP and HR elevated, can not be still. Pt has been getting 2 po Ativan, increased to 4po with last CIWA, will switch to IV for next dose to see if pt can settle down. Will continue to monitor and adjust as needed. Azreal Stthomas P Luretha Eberly

## 2016-12-09 NOTE — Progress Notes (Addendum)
PCCM Progress Note  Admission date: 12/07/2016 Referring provider: Dr. Adair Patter, [facility]  CC: GI bleeding  HPI: 71 yo female presented with melena and hematemesis.  She has hx of ETOH and developed progressive agitation.  PMHx of Asthma, GERD, HLD, Insomnia, Psoriasis, Renal cell CA 2012 s/p partial nephrectomy  Subjective: Off precedex.  Denies chest pain.  Breathing okay.  Wants something more solid to eat.  Vital signs: BP (!) 148/88   Pulse 75   Temp (!) 95.8 F (35.4 C) (Axillary)   Resp 20   Ht 5\' 7"  (1.702 m)   Wt 154 lb 15.7 oz (70.3 kg)   SpO2 98%   BMI 24.27 kg/m   Intake/outpt: I/O last 3 completed shifts: In: 5744.4 [P.O.:640; I.V.:3969.4; Blood:635; IV Piggyback:500] Out: 2100 [Urine:2100]  General: pleasant Neuro: alert, follows commands Eyes: pupils reactive ENT: no stridor Cardiac: regular, no murmur Chest: no wheeze Abd: soft, non tender Ext: no edema Skin: no rashes   CMP Latest Ref Rng & Units 12/08/2016 12/07/2016 12/07/2016  Glucose 65 - 99 mg/dL 92 113(H) 114(H)  BUN 6 - 20 mg/dL 29(H) 37(H) 42(H)  Creatinine 0.44 - 1.00 mg/dL 0.65 0.90 0.79  Sodium 135 - 145 mmol/L 139 134(L) 134(L)  Potassium 3.5 - 5.1 mmol/L 3.3(L) 4.6 4.6  Chloride 101 - 111 mmol/L 109 100(L) 99(L)  CO2 22 - 32 mmol/L 23 - 22  Calcium 8.9 - 10.3 mg/dL 7.7(L) - 8.3(L)  Total Protein 6.5 - 8.1 g/dL - - 6.7  Total Bilirubin 0.3 - 1.2 mg/dL - - 2.2(H)  Alkaline Phos 38 - 126 U/L - - 610(H)  AST 15 - 41 U/L - - 145(H)  ALT 14 - 54 U/L - - 44    CBC Latest Ref Rng & Units 12/08/2016 12/08/2016 12/07/2016  WBC 4.0 - 10.5 K/uL 9.7 12.0(H) 14.3(H)  Hemoglobin 12.0 - 15.0 g/dL 10.6(L) 10.4(L) 7.1(L)  Hematocrit 36.0 - 46.0 % 33.7(L) 29.8(L) 22.4(L)  Platelets 150 - 400 K/uL 141(L) 212 239    CBG (last 3)  No results for input(s): GLUCAP in the last 72 hours.   Dg Chest Port 1 View  Result Date: 12/07/2016 CLINICAL DATA:  Tachycardia. EXAM: PORTABLE CHEST 1 VIEW COMPARISON:   02/28/2014 and 04/13/2016 FINDINGS: Lungs are adequately inflated and otherwise clear. Cardiomediastinal silhouette is within normal. There is minimal calcified plaque over the aortic arch. Remaining bones and soft tissues are within normal. IMPRESSION: No acute cardiopulmonary disease. Mild aortic atherosclerosis. Electronically Signed   By: Marin Olp M.D.   On: 12/07/2016 16:40    Studies: EGD 1/27 >> grade 1 esophageal varices, portal HTN gastropathy  Events: 1/27 Admit, GI consulted >> EGD 1/28 started precedex 1/29 off precedex  Assessment/plan:  Upper GI bleeding with esophageal varices and portal gastropathy. - advance diet per GI - protonix, octreotide per GI  Agitated delirium with hx of ETOH. - improved 1/29 - CIWA q4h with prn ativan for CIWA > 8 - thiamine, folic acid  Acute blood loss anemia 2nd to GI bleed. Thrombocytopenia in setting of ETOH. - f/u CBC  Hypokalemia. - replace electrolytes as needed  Hx of asthma. - dulera  Deconditioning, unsteady gait. - PT/OT assessment  DVT prophylaxis - SCDs SUP - Protonix  Nutrition - full liquid Goals of care - Full code  Change to SDU status 1/29.  PCCM will sign off.  Chesley Mires, MD [facility] Pulmonary/Critical Care 12/09/2016, 11:01 AM Pager:  [phone] After 3pm call: [phone]

## 2016-12-09 NOTE — Progress Notes (Signed)
[facility] Progress Note  Subjective: Nurse reports patient had a normal bowel movement today. She seems confused. Hb stable  Objective: Vital signs in last 24 hours: Temp:  [97.3 F (36.3 C)-98.2 F (36.8 C)] 97.3 F (36.3 C) (01/29 0300) Pulse Rate:  [70-101] 70 (01/29 0600) Resp:  [13-30] 16 (01/29 0600) BP: (94-150)/(56-94) 114/73 (01/29 0600) SpO2:  [95 %-100 %] 99 % (01/29 0600) Weight change:    [id number]  Lab Results: No results found for this or any previous visit (from the past 24 hour(s)).  Studies/Results: Dg Chest Port 1 View  Result Date: 12/07/2016 CLINICAL DATA:  Tachycardia. EXAM: PORTABLE CHEST 1 VIEW COMPARISON:  02/28/2014 and 04/13/2016 FINDINGS: Lungs are adequately inflated and otherwise clear. Cardiomediastinal silhouette is within normal. There is minimal calcified plaque over the aortic arch. Remaining bones and soft tissues are within normal. IMPRESSION: No acute cardiopulmonary disease. Mild aortic atherosclerosis. Electronically Signed   By: Marin Olp M.D.   On: 12/07/2016 16:40      Assessment: GI bleeding presumably related to portal hypertension/esophageal varices, stable  Plan: Continue octreotide another day. Advance diet as tolerated. Monitor stools and hemoglobin.      Nature Kueker C 12/09/2016, 7:20 AM  Pager [phone] If no answer or after 5 PM call [phone]

## 2016-12-09 NOTE — Progress Notes (Signed)
PROGRESS NOTE    [name]  [id number] DOB: 1945-12-21 DOA: 12/07/2016 PCP: No primary care provider on file.    Brief Narrative:  [name] is a 71 y.o. female with a history of GI bleeding and alcohol abuse presenting for melena and hematemesis. She reports abrupt onset yesterday evening of frequent bloody emesis and dark stools associated with nausea and abdominal discomfort that is moderate, diffuse, worse with emesis. Today she noticed severe lightheadedness and fatigue, in addition to ongoing hematemesis, approximately 5-6 episodes in past 12 hours and 2 small volume dark stools.   She has not been compliant with medications and reports drinking a half gallon of vodka every 2-3 days, which she hides from her husband who believes she has a drinking problem. She stopped drinking within the past few months for a few weeks and noted mild tremor without seizures. Started back drinking due to stress.   ED Course: On arrival she was tachycardic at 118bpm, BP 113/89 and afebrile, 100% on room air. Hgb 6.4, down from 10.8 on 12/17, and FOBT was positive. GI called for evaluation and [facility] asked to admit.    Assessment & Plan:   Principal Problem:   UGI bleed Active Problems:   Asthma   Alcohol abuse   Tobacco abuse   Depression   Acute blood loss anemia   Acute blood loss anemia:  - T&S, transfused 1u PRBCs - H/H improved - EGD showing esophageal varices and portal hypertensive gastropathy - Serial CBC to continue - Restart po iron today  - IVF to 38ml/hr as patient vital signs stable - no blood in stools noted  Upper GI bleed:  - Related to known portal hypertensive gastropathy/esophageal varices in setting of medication noncompliance and alcoholism - Octreotide, PPI IV BID - per GI note will continue octreotide for 1 more day - recurrent admissions for this - patient asking about facility to help with alcohol cessation (SW consulted) - no episodes of emesis or  BRBPR  EtOH abuse:  - Patient reports being interested in alcohol abuse treatment - will consult SW to assist with finding a facility or giving patient options - CIWA (last score of 1) - Thiamine, folate - Cessation counseling - f/u SW  Asthma:  - On room air - Continue formulary dulera - PRN bronchodilator  Hypokalemia - replete with IV potassium - repeat levels pending   DVT prophylaxis: SCDs  Code Status: Full  Family Communication: None at bedside; attempted to call patient's husband 3 times- no answering machine available Disposition Plan: Will likely discharge home after detoxing although patient already stating she is going to leave today which would be AMA as she feels better and is no longer bleeding   Consultants:   Gastroenterology, Dr. Penelope Coop  Procedures:   EGD  Antimicrobials:   None    Subjective: Patient seen this am.  She is asking if I can call her husband and ask him to come in to to talk to her and to bring her a wig.  Voices she feels as though she may need to stop drinking in order to stop coming in with GI bleeding.  Mentions she feels as though her husband needs to come in and discuss this as well because he is "part of this journey".  Says she is having diarrhea.  Asking what possibilities she has in terms of inpatient rehab.  Objective: Vitals:   12/09/16 0800 12/09/16 0900 12/09/16 0926 12/09/16 1000  BP: (!) 141/83 118/71  Marland Kitchen)  148/88  Pulse: 71 73  75  Resp: 19 14  20   Temp: (!) 95.8 F (35.4 C)     TempSrc: Axillary     SpO2: 100% 100% 97% 98%  Weight:      Height:        Intake/Output Summary (Last 24 hours) at 12/09/16 1243 Last data filed at 12/09/16 1000  Gross per 24 hour  Intake          2073.21 ml  Output              700 ml  Net          1373.21 ml   Filed Weights   12/07/16 1051  Weight: 70.3 kg (154 lb 15.7 oz)    Examination:  General exam: Appears agitated- moving around bed and scratching  herself Respiratory system: Clear to auscultation. Respiratory effort normal. Cardiovascular system: S1 & S2 heard, RRR. No JVD, murmurs, rubs, gallops or clicks. No pedal edema. Gastrointestinal system: Abdomen is nondistended, soft and nontender. No organomegaly or masses felt. Normal bowel sounds heard. Central nervous system: Alert and oriented. No focal neurological deficits. Extremities: Symmetric 5 x 5 power. Skin: Numerous scratch marks on skin Psychiatry: Limited insight into medical problems and alcoholism. Mood & affect appropriate.     Data Reviewed: I have personally reviewed following labs and imaging studies  CBC:  Recent Labs Lab 12/07/16 0835 12/07/16 0837 12/07/16 1523 12/08/16 0123 12/08/16 0654  WBC 14.2*  --  14.3* 12.0* 9.7  HGB 6.4* 7.5* 7.1* 10.4* 10.6*  HCT 20.1* 22.0* 22.4* 29.8* 33.7*  MCV 94.4  --  88.2 86.6 86.4  PLT 324  --  239 212 [id number]*   Basic Metabolic Panel:  Recent Labs Lab 12/07/16 0835 12/07/16 0837 12/08/16 0123 12/08/16 0126  NA 134* 134* 139  --   K 4.6 4.6 3.3*  --   CL 99* 100* 109  --   CO2 22  --  23  --   GLUCOSE 114* 113* 92  --   BUN 42* 37* 29*  --   CREATININE 0.79 0.90 0.65  --   CALCIUM 8.3*  --  7.7*  --   MG  --   --   --  1.3*   GFR: Estimated Creatinine Clearance: 65.5 mL/min (by C-G formula based on SCr of 0.65 mg/dL). Liver Function Tests:  Recent Labs Lab 12/07/16 0835  AST 145*  ALT 44  ALKPHOS 610*  BILITOT 2.2*  PROT 6.7  ALBUMIN 2.9*    Recent Labs Lab 12/07/16 0835  LIPASE 15   No results for input(s): AMMONIA in the last 168 hours. Coagulation Profile: No results for input(s): INR, PROTIME in the last 168 hours. Cardiac Enzymes: No results for input(s): CKTOTAL, CKMB, CKMBINDEX, TROPONINI in the last 168 hours. BNP (last 3 results) No results for input(s): PROBNP in the last 8760 hours. HbA1C: No results for input(s): HGBA1C in the last 72 hours. CBG: No results for input(s):  GLUCAP in the last 168 hours. Lipid Profile: No results for input(s): CHOL, HDL, LDLCALC, TRIG, CHOLHDL, LDLDIRECT in the last 72 hours. Thyroid Function Tests: No results for input(s): TSH, T4TOTAL, FREET4, T3FREE, THYROIDAB in the last 72 hours. Anemia Panel: No results for input(s): VITAMINB12, FOLATE, FERRITIN, TIBC, IRON, RETICCTPCT in the last 72 hours. Sepsis Labs:  Recent Labs Lab 12/07/16 0837  LATICACIDVEN 4.27*    Recent Results (from the past 240 hour(s))  MRSA PCR Screening  Status: None   Collection Time: 12/07/16 10:45 AM  Result Value Ref Range Status   MRSA by PCR NEGATIVE NEGATIVE Final    Comment:        The GeneXpert MRSA Assay (FDA approved for NASAL specimens only), is one component of a comprehensive MRSA colonization surveillance program. It is not intended to diagnose MRSA infection nor to guide or monitor treatment for MRSA infections.          Radiology Studies: Dg Chest Port 1 View  Result Date: 12/07/2016 CLINICAL DATA:  Tachycardia. EXAM: PORTABLE CHEST 1 VIEW COMPARISON:  02/28/2014 and 04/13/2016 FINDINGS: Lungs are adequately inflated and otherwise clear. Cardiomediastinal silhouette is within normal. There is minimal calcified plaque over the aortic arch. Remaining bones and soft tissues are within normal. IMPRESSION: No acute cardiopulmonary disease. Mild aortic atherosclerosis. Electronically Signed   By: Marin Olp M.D.   On: 12/07/2016 16:40        Scheduled Meds: . folic acid  1 mg Intravenous Daily  . mometasone-formoterol  2 puff Inhalation BID  . pantoprazole (PROTONIX) IVPB  80 mg Intravenous BID  . sodium chloride flush  3 mL Intravenous Q12H  . thiamine  100 mg Intravenous Daily   Continuous Infusions: . sodium chloride 50 mL/hr at 12/09/16 1000  . octreotide  (SANDOSTATIN)    IV infusion 50 mcg/hr (12/09/16 1000)     LOS: 2 days    Time spent: 30 minutes    Loretha Stapler, MD [facility] Pager [phone]  If 7PM-7AM, please contact night-coverage [email or web address] Password [facility] 12/09/2016, 12:43 PM

## 2016-12-10 LAB — CBC
HEMATOCRIT: 27.3 % — AB (ref 36.0–46.0)
HEMOGLOBIN: 8.9 g/dL — AB (ref 12.0–15.0)
MCH: 29.5 pg (ref 26.0–34.0)
MCHC: 32.6 g/dL (ref 30.0–36.0)
MCV: 90.4 fL (ref 78.0–100.0)
Platelets: 228 10*3/uL (ref 150–400)
RBC: 3.02 MIL/uL — AB (ref 3.87–5.11)
RDW: 18.8 % — ABNORMAL HIGH (ref 11.5–15.5)
WBC: 13.5 10*3/uL — ABNORMAL HIGH (ref 4.0–10.5)

## 2016-12-10 LAB — COMPREHENSIVE METABOLIC PANEL
ALBUMIN: 2.8 g/dL — AB (ref 3.5–5.0)
ALT: 26 U/L (ref 14–54)
ANION GAP: 8 (ref 5–15)
AST: 62 U/L — ABNORMAL HIGH (ref 15–41)
Alkaline Phosphatase: 396 U/L — ABNORMAL HIGH (ref 38–126)
BUN: 5 mg/dL — ABNORMAL LOW (ref 6–20)
CALCIUM: 8.3 mg/dL — AB (ref 8.9–10.3)
CHLORIDE: 110 mmol/L (ref 101–111)
CO2: 22 mmol/L (ref 22–32)
Creatinine, Ser: 0.57 mg/dL (ref 0.44–1.00)
GFR calc non Af Amer: >60 mL/min (ref >60)
GLUCOSE: 120 mg/dL — AB (ref 65–99)
POTASSIUM: 4.4 mmol/L (ref 3.5–5.1)
SODIUM: 140 mmol/L (ref 135–145)
Total Bilirubin: 0.7 mg/dL (ref 0.3–1.2)
Total Protein: 6.3 g/dL — ABNORMAL LOW (ref 6.5–8.1)

## 2016-12-10 LAB — MAGNESIUM: MAGNESIUM: 1.3 mg/dL — AB (ref 1.7–2.4)

## 2016-12-10 MED ORDER — MORPHINE SULFATE (PF) 2 MG/ML IV SOLN: 2.0000 mg | INTRAVENOUS | Status: DC | PRN

## 2016-12-10 MED ORDER — DEXMEDETOMIDINE HCL IN NACL 200 MCG/50ML IV SOLN
0.4000 ug/kg/h | INTRAVENOUS | Status: AC
  Administered 2016-12-10: 0.5 ug/kg/h via INTRAVENOUS
  Administered 2016-12-10: 1 ug/kg/h via INTRAVENOUS
  Administered 2016-12-10: 1.2 ug/kg/h via INTRAVENOUS
  Administered 2016-12-10: 0.4 ug/kg/h via INTRAVENOUS
  Administered 2016-12-11 (×2): 0.6 ug/kg/h via INTRAVENOUS
  Filled 2016-12-10 (×5): qty 50

## 2016-12-10 MED ORDER — DEXMEDETOMIDINE HCL IN NACL 200 MCG/50ML IV SOLN
0.2000 ug/kg/h | INTRAVENOUS | Status: DC
  Filled 2016-12-10: qty 50

## 2016-12-10 MED ORDER — LORAZEPAM 2 MG/ML IJ SOLN: 1.0000 mg | INTRAMUSCULAR | Status: DC | PRN

## 2016-12-10 MED ORDER — LORAZEPAM 1 MG PO TABS: 1.0000 mg | ORAL_TABLET | ORAL | Status: DC | PRN

## 2016-12-10 MED ORDER — ARFORMOTEROL TARTRATE 15 MCG/2ML IN NEBU
15.0000 ug | INHALATION_SOLUTION | Freq: Two times a day (BID) | RESPIRATORY_TRACT | Status: DC
Start: 2016-12-10 — End: 2016-12-14
  Administered 2016-12-10 – 2016-12-14 (×8): 15 ug via RESPIRATORY_TRACT
  Filled 2016-12-10 (×9): qty 2

## 2016-12-10 MED ORDER — FENTANYL CITRATE (PF) 100 MCG/2ML IJ SOLN
50.0000 ug | Freq: Once | INTRAMUSCULAR | Status: AC
  Administered 2016-12-10: 50 ug via INTRAVENOUS
  Filled 2016-12-10: qty 2

## 2016-12-10 MED ORDER — DIAZEPAM 5 MG/ML IJ SOLN
5.0000 mg | Freq: Once | INTRAMUSCULAR | Status: AC
  Administered 2016-12-10: 5 mg via INTRAVENOUS
  Filled 2016-12-10: qty 2

## 2016-12-10 MED ORDER — LORAZEPAM 2 MG/ML IJ SOLN
2.0000 mg | Freq: Once | INTRAMUSCULAR | Status: AC
  Administered 2016-12-10: 2 mg via INTRAVENOUS
  Filled 2016-12-10: qty 1

## 2016-12-10 MED ORDER — ZOLPIDEM TARTRATE 5 MG PO TABS
5.0000 mg | ORAL_TABLET | Freq: Once | ORAL | Status: AC
  Administered 2016-12-10: 5 mg via ORAL

## 2016-12-10 MED ORDER — BUDESONIDE 0.5 MG/2ML IN SUSP
0.5000 mg | Freq: Two times a day (BID) | RESPIRATORY_TRACT | Status: DC
  Administered 2016-12-10 – 2016-12-14 (×8): 0.5 mg via RESPIRATORY_TRACT
  Filled 2016-12-10 (×9): qty 2

## 2016-12-10 MED ORDER — FENTANYL CITRATE (PF) 100 MCG/2ML IJ SOLN: 25.0000 ug | INTRAMUSCULAR | Status: DC | PRN

## 2016-12-10 MED ORDER — MAGNESIUM SULFATE 4 GM/100ML IV SOLN
4.0000 g | Freq: Once | INTRAVENOUS | Status: AC
  Administered 2016-12-10: 4 g via INTRAVENOUS
  Filled 2016-12-10: qty 100

## 2016-12-10 NOTE — Progress Notes (Signed)
PROGRESS NOTE    SHAWNETTE HILLIKER  [id number] DOB: 07-Jul-1946 DOA: 12/07/2016 PCP: No primary care provider on file.    Brief Narrative:  [name] is a 71 y.o. female with a history of GI bleeding and alcohol abuse presenting for melena and hematemesis. She reports abrupt onset yesterday evening of frequent bloody emesis and dark stools associated with nausea and abdominal discomfort that is moderate, diffuse, worse with emesis. Today she noticed severe lightheadedness and fatigue, in addition to ongoing hematemesis, approximately 5-6 episodes in past 12 hours and 2 small volume dark stools.   She has not been compliant with medications and reports drinking a half gallon of vodka every 2-3 days, which she hides from her husband who believes she has a drinking problem. She stopped drinking within the past few months for a few weeks and noted mild tremor without seizures. Started back drinking due to stress.   ED Course: On arrival she was tachycardic at 118bpm, BP 113/89 and afebrile, 100% on room air. Hgb 6.4, down from 10.8 on 12/17, and FOBT was positive. GI called for evaluation and [facility] asked to admit.   Patient EGD performed and showed Grade I esophageal varices, portal hypertensive gastropathy, normal duodenum.  Patient began having diffuse diarrhea on 1/29 and C. Diff sent to lab and resulted as positive. Started on PO vancomycin.  On 1/30 began having significant ETOH withdrawals.    Assessment & Plan:   Principal Problem:   UGI bleed Active Problems:   Asthma   Alcohol abuse   Tobacco abuse   Depression   Acute blood loss anemia   Acute alcohol abuse, with unspecified complication (HCC)   Tachypnea  EtOH abuse:  - will consult SW to assist with finding a facility or giving patient options - CIWA (last score of 16 and 18) - Thiamine, folate - need to increase ativan frequency and dosing - will restart precedex - PCCM reconsulted - giving 47mcg fentanyl and 5mg  valium IV  now - PRN morphine ordered  Upper GI bleed:  - Related to known portal hypertensive gastropathy/esophageal varices in setting of medication noncompliance and alcoholism - Octreotide, PPI IV BID - per GI note will continue octreotide for 1 more day - recurrent admissions for this - patient asking about facility to help with alcohol cessation (SW consulted) - no episodes of emesis or BRBPR - H/H slightly decreased- will repeat this afternoon - T&S performed, transfused 1u PRBCs - H/H slightly decreased this am - will continue to monitor- no signs of active bleeding at this time - EGD showing esophageal varices and portal hypertensive gastropathy - IVF to 55ml/hr as patient vital signs stable - no blood in stools noted  C. Diff  - started on PO vancomycin - unclear if patient will be able to continue to tolerate this - if able to tolerate PO will continue vancomycin otherwise switch to flagyl  Asthma:  - On room air - Continue formulary dulera - PRN bronchodilator  Hypokalemia - replete with IV potassium - repeat levels pending   DVT prophylaxis: SCDs  Code Status: Full  Family Communication: None at bedside; attempted to call patient's husband 3 times- no answering machine available, patient's husband did call her room when I was present this am- states he is not coming to visit her as she is not in her right mind Disposition Plan: guarded- restart precedex, significant withdrawals today   Consultants:   Gastroenterology, Dr. Penelope Coop  PCCM- Dr. Llana Aliment  Procedures:  EGD  Antimicrobials:   None    Subjective: Patient very combative this am.  Attempted to bite MD and nursing staff.  Kicking, screaming, needed restraints to give IV medication and to pulled all monitors off.  PCCM reconsulted.  Precedex restarted.  Says she is in [place] but when talking to her husband states she be is being held Engineer, civil (consulting) at [employer].   Objective: Vitals:   12/09/16 2000 12/09/16  2025 12/09/16 2305 12/10/16 0355  BP: (!) 164/60  (!) 151/48 (!) 143/72  Pulse: 88  (!) 107 (!) 103  Resp: 19  (!) 23 (!) 26  Temp: 98.7 F (37.1 C)  98.2 F (36.8 C) 98.7 F (37.1 C)  TempSrc:    Oral  SpO2: 100% 100% 100% 97%  Weight:      Height:        Intake/Output Summary (Last 24 hours) at 12/10/16 0806 Last data filed at 12/10/16 0600  Gross per 24 hour  Intake             2370 ml  Output             3350 ml  Net             -980 ml   Filed Weights   12/07/16 1051  Weight: 70.3 kg (154 lb 15.7 oz)    Examination:  General exam: Extremely agitated- wearing restraints attempting to bite nursing staff Respiratory system: Clear to auscultation. Respiratory effort normal. Cardiovascular system: S1 & S2 heard, RRR. No JVD, murmurs, rubs, gallops or clicks. No pedal edema. Gastrointestinal system: Abdomen is nondistended, soft and nontender. No organomegaly or masses felt. Normal bowel sounds heard. Central nervous system: Alert not oriemted Extremities: Symmetric 5 x 5 power. Skin: Numerous scratch marks on skin Psychiatry: anxious and agitated    Data Reviewed: I have personally reviewed following labs and imaging studies  CBC:  Recent Labs Lab 12/07/16 1523 12/08/16 0123 12/08/16 0654 12/09/16 1245 12/10/16 0111  WBC 14.3* 12.0* 9.7 11.7* 13.5*  HGB 7.1* 10.4* 10.6* 9.7* 8.9*  HCT 22.4* 29.8* 33.7* 29.4* 27.3*  MCV 88.2 86.6 86.4 90.5 90.4  PLT 239 212 141* 206 [id number]   Basic Metabolic Panel:  Recent Labs Lab 12/07/16 0835 12/07/16 0837 12/08/16 0123 12/08/16 0126 12/09/16 1245 12/10/16 0111  NA 134* 134* 139  --  141 140  K 4.6 4.6 3.3*  --  4.9 4.4  CL 99* 100* 109  --  112* 110  CO2 22  --  23  --  20* 22  GLUCOSE 114* 113* 92  --  108* 120*  BUN 42* 37* 29*  --  7 5*  CREATININE 0.79 0.90 0.65  --  0.48 0.57  CALCIUM 8.3*  --  7.7*  --  8.3* 8.3*  MG  --   --   --  1.3* 1.3* 1.3*   GFR: Estimated Creatinine Clearance: 65.5 mL/min (by  C-G formula based on SCr of 0.57 mg/dL). Liver Function Tests:  Recent Labs Lab 12/07/16 0835 12/10/16 0111  AST 145* 62*  ALT 44 26  ALKPHOS 610* 396*  BILITOT 2.2* 0.7  PROT 6.7 6.3*  ALBUMIN 2.9* 2.8*    Recent Labs Lab 12/07/16 0835  LIPASE 15   No results for input(s): AMMONIA in the last 168 hours. Coagulation Profile: No results for input(s): INR, PROTIME in the last 168 hours. Cardiac Enzymes: No results for input(s): CKTOTAL, CKMB, CKMBINDEX, TROPONINI in the last 168 hours. BNP (  last 3 results) No results for input(s): PROBNP in the last 8760 hours. HbA1C: No results for input(s): HGBA1C in the last 72 hours. CBG: No results for input(s): GLUCAP in the last 168 hours. Lipid Profile: No results for input(s): CHOL, HDL, LDLCALC, TRIG, CHOLHDL, LDLDIRECT in the last 72 hours. Thyroid Function Tests: No results for input(s): TSH, T4TOTAL, FREET4, T3FREE, THYROIDAB in the last 72 hours. Anemia Panel: No results for input(s): VITAMINB12, FOLATE, FERRITIN, TIBC, IRON, RETICCTPCT in the last 72 hours. Sepsis Labs:  Recent Labs Lab 12/07/16 [id number]  LATICACIDVEN 4.27*    Recent Results (from the past 240 hour(s))  MRSA PCR Screening     Status: None   Collection Time: 12/07/16 10:45 AM  Result Value Ref Range Status   MRSA by PCR NEGATIVE NEGATIVE Final    Comment:        The GeneXpert MRSA Assay (FDA approved for NASAL specimens only), is one component of a comprehensive MRSA colonization surveillance program. It is not intended to diagnose MRSA infection nor to guide or monitor treatment for MRSA infections.   C difficile quick scan w PCR reflex     Status: Abnormal   Collection Time: 12/09/16 12:47 PM  Result Value Ref Range Status   C Diff antigen POSITIVE (A) NEGATIVE Final   C Diff toxin NEGATIVE NEGATIVE Final   C Diff interpretation Results are indeterminate. See PCR results.  Final  Clostridium Difficile by PCR     Status: Abnormal    Collection Time: 12/09/16 12:47 PM  Result Value Ref Range Status   Toxigenic C Difficile by pcr POSITIVE (A) NEGATIVE Final    Comment: Positive for toxigenic C. difficile with little to no toxin production. Only treat if clinical presentation suggests symptomatic illness. Performed at [facility], [place] [address] [place], [place] [address]          Radiology Studies: No results found.      Scheduled Meds: . ferrous sulfate  325 mg Oral BID WC  . folic acid  1 mg Intravenous Daily  . mometasone-formoterol  2 puff Inhalation BID  . pantoprazole (PROTONIX) IVPB  80 mg Intravenous BID  . sodium chloride flush  3 mL Intravenous Q12H  . thiamine  100 mg Intravenous Daily  . vancomycin  125 mg Oral QID   Continuous Infusions: . sodium chloride 50 mL/hr at 12/09/16 1808  . octreotide  (SANDOSTATIN)    IV infusion 50 mcg/hr (12/10/16 0435)     LOS: 3 days    Time spent: 30 minutes    Loretha Stapler, MD [facility] Pager [phone]  If 7PM-7AM, please contact night-coverage [email or web address] Password [email or web address] 12/10/2016, 8:06 AM

## 2016-12-10 NOTE — Progress Notes (Signed)
PCCM Progress Note  Admission date: 12/07/2016 Referring provider: Dr. Adair Patter, [facility]  CC: GI bleeding  HPI: 71 yo female presented with melena and hematemesis.  She has hx of ETOH and developed progressive agitation.  PMHx of Asthma, GERD, HLD, Insomnia, Psoriasis, Renal cell CA 2012 s/p partial nephrectomy  Subjective: Progressive delirium overnight.  Not improved with ativan, valium, haldol, fentanyl.  Vital signs: BP (!) 143/72   Pulse (!) 103   Temp 98.7 F (37.1 C) (Oral)   Resp (!) 26   Ht 5\' 7"  (1.702 m)   Wt 154 lb 15.7 oz (70.3 kg)   SpO2 97%   BMI 24.27 kg/m   Intake/outpt: I/O last 3 completed shifts: In: 3640.4 [P.O.:720; I.V.:2820.4; IV Piggyback:100] Out: 3650 [Urine:3650]  General: agitated Neuro: RASS +3 Eyes: pupils reactive ENT: no stridor Cardiac: regular, tachycardic Chest: no wheeze Abd: soft, non tender Ext: no edema Skin: no rashes   CMP Latest Ref Rng & Units 12/10/2016 12/09/2016 12/08/2016  Glucose 65 - 99 mg/dL 120(H) 108(H) 92  BUN 6 - 20 mg/dL 5(L) 7 29(H)  Creatinine 0.44 - 1.00 mg/dL 0.57 0.48 0.65  Sodium 135 - 145 mmol/L 140 141 139  Potassium 3.5 - 5.1 mmol/L 4.4 4.9 3.3(L)  Chloride 101 - 111 mmol/L 110 112(H) 109  CO2 22 - 32 mmol/L 22 20(L) 23  Calcium 8.9 - 10.3 mg/dL 8.3(L) 8.3(L) 7.7(L)  Total Protein 6.5 - 8.1 g/dL 6.3(L) - -  Total Bilirubin 0.3 - 1.2 mg/dL 0.7 - -  Alkaline Phos 38 - 126 U/L 396(H) - -  AST 15 - 41 U/L 62(H) - -  ALT 14 - 54 U/L 26 - -    CBC Latest Ref Rng & Units 12/10/2016 12/09/2016 12/08/2016  WBC 4.0 - 10.5 K/uL 13.5(H) 11.7(H) 9.7  Hemoglobin 12.0 - 15.0 g/dL 8.9(L) 9.7(L) 10.6(L)  Hematocrit 36.0 - 46.0 % 27.3(L) 29.4(L) 33.7(L)  Platelets 150 - 400 K/uL 228 206 141(L)    CBG (last 3)  No results for input(s): GLUCAP in the last 72 hours.   No results found.  Studies: EGD 1/27 >> grade 1 esophageal varices, portal HTN gastropathy  Cultures: C diff PCR 1/29 >> antigen positive,  toxin negative  Events: 1/27 Admit, GI consulted >> EGD 1/28 started precedex 1/29 off precedex 1/30 back on precedex  Assessment/plan:  Upper GI bleeding with esophageal varices and portal gastropathy. - octreotide, protonix per GI  Agitated delirium with hx of ETOH. - resume precedex 1/30 - continue thiamine, folic acid  Acute blood loss anemia 2nd to GI bleed. Thrombocytopenia in setting of ETOH. - f/u CBC - continue FeSO4  C diff antigen positive, but toxin negative. - defer whether she needs tx for this to primary team  Hx of asthma. - budesonide, brovana bid with prn albuterol  Deconditioning, unsteady gait. - PT/OT assessment when mental status improved  DVT prophylaxis - SCDs SUP - Protonix  Nutrition - Soft diet Goals of care - Full code  CC time 33 minutes  Chesley Mires, MD [place] 12/10/2016, 8:34 AM Pager:  [phone] After 3pm call: [phone]

## 2016-12-10 NOTE — Progress Notes (Signed)
OT Cancellation Note  Patient Details Name: [name] MRN: [id number] DOB: Jun 02, 1946   Cancelled Treatment:    Reason Eval/Treat Not Completed: Other (comment).  Pt agitated and on presidex. Will check another day.  Davin Archuletta 12/10/2016, 12:23 PM  Lesle Chris, OTR/L [phone] 12/10/2016

## 2016-12-10 NOTE — Progress Notes (Signed)
Pt still agitated, getting out of bed, pulling equipment off, urinating in the floor.  CIWA elevated but ativan doses are off schedule from CIWA times.   Will give more ativan if pt warrants in at 0545.  Henri Baumler P Jaikob Borgwardt

## 2016-12-10 NOTE — Progress Notes (Signed)
PT Cancellation Note  Patient Details Name: [name] MRN: [id number] DOB: [birth year]   Cancelled Treatment:    Reason Eval/Treat Not Completed: Medical issues which prohibited therapy (agitated,  on Precedex. check back when stable. )   Marcelino Freestone PT [id number]  12/10/2016, 11:08 AM

## 2016-12-11 DIAGNOSIS — F10929 Alcohol use, unspecified with intoxication, unspecified: Secondary | ICD-10-CM

## 2016-12-11 LAB — CBC
HCT: 28.3 % — ABNORMAL LOW (ref 36.0–46.0)
HEMOGLOBIN: 9.2 g/dL — AB (ref 12.0–15.0)
MCH: 30.1 pg (ref 26.0–34.0)
MCHC: 32.5 g/dL (ref 30.0–36.0)
MCV: 92.5 fL (ref 78.0–100.0)
Platelets: 206 10*3/uL (ref 150–400)
RBC: 3.06 MIL/uL — AB (ref 3.87–5.11)
RDW: 19 % — ABNORMAL HIGH (ref 11.5–15.5)
WBC: 8.7 10*3/uL (ref 4.0–10.5)

## 2016-12-11 LAB — BASIC METABOLIC PANEL
ANION GAP: 6 (ref 5–15)
CHLORIDE: 111 mmol/L (ref 101–111)
CO2: 23 mmol/L (ref 22–32)
Calcium: 8.3 mg/dL — ABNORMAL LOW (ref 8.9–10.3)
Creatinine, Ser: 0.5 mg/dL (ref 0.44–1.00)
Glucose, Bld: 100 mg/dL — ABNORMAL HIGH (ref 65–99)
POTASSIUM: 4.2 mmol/L (ref 3.5–5.1)
SODIUM: 140 mmol/L (ref 135–145)

## 2016-12-11 MED ORDER — SODIUM CHLORIDE 0.9 % IV SOLN
25.0000 mg | INTRAVENOUS | Status: DC | PRN
  Administered 2016-12-11 – 2016-12-12 (×3): 25 mg via INTRAVENOUS
  Filled 2016-12-11 (×3): qty 0.5

## 2016-12-11 NOTE — Clinical Social Work Note (Signed)
Clinical Social Work Assessment  Patient Details  Name: [name] MRN: [id number] Date of Birth: 08-25-1946  Date of referral:  12/11/16               Reason for consult:  Facility Placement, Substance Use/ETOH Abuse                Permission sought to share information with:  Customer service manager, Other (Substance abuse/Alcohol treatment) Permission granted to share information::  No (Pt refused SNF placement and SA TX)  Name::        Agency::     Relationship::     Contact Information:     Housing/Transportation Living arrangements for the past 2 months:  Single Family Home Source of Information:  Patient, Spouse Patient Interpreter Needed:  None Criminal Activity/Legal Involvement Pertinent to Current Situation/Hospitalization:    Significant Relationships:  Spouse Lives with:  Spouse Do you feel safe going back to the place where you live?  Yes Need for family participation in patient care:  Yes (Comment)  Care giving concerns:  Pt denies alcohol and/or withdrawal symptoms are a problem and refuses SNF placement, per PT's recommendation.      Social Worker assessment / plan:  CSW met with pt and confirmed pt's plan to be discharged home. .  Pt has been living independently with her husband prior to being admitted.  Pt would like assistance with a nebulizer and a portable toilet, as well as Home Health.  CSW will request nursing complete a Case Manager Consult/      Employment status:  Retired Nurse, adult PT Recommendations:  [place] / Referral to community resources:  SBIRT completed by CSW.   Patient/Family's Response to care:  Pt and pt's husband appreciated CSW's efforts and thanked the CSW. Patient/Family's Understanding of and Emotional Response to Diagnosis, Current Treatment, and Prognosis:  Pt and family understand current prognosis and treatment.  Emotional Assessment Appearance:  Appears  stated age Attitude/Demeanor/Rapport:    Affect (typically observed):  Agitated, In denial, Anxious Orientation:  Oriented to Self, Oriented to Place, Oriented to  Time, Oriented to Situation Alcohol / Substance use:  Alcohol Use Psych involvement (Current and /or in the community):  No (Comment)  Discharge Needs  Concerns to be addressed:  No discharge needs identified Readmission within the last 30 days:  Yes Current discharge risk:    Barriers to Discharge:  No Barriers Identified (Pt refusing SNF placement)   Alphonse Guild [name], LCSWA 12/11/2016, 2:40 PM

## 2016-12-11 NOTE — Consult Note (Signed)
   Admission date: 12/07/2016 Referring provider: Dr. Adair Patter, [facility]  CC: GI bleeding  HPI: 71 yo female presented with melena and hematemesis.  She has hx of ETOH and developed progressive agitation.  PMHx of Asthma, GERD, HLD, Insomnia, Psoriasis, Renal cell CA 2012 s/p partial nephrectomy  Subjective: Continued agitation last night. On precedex still, but less agitated today. Toady she reports intermitted lung tightness improved with nebulizer. She denies CP, SOB, fevers, chill, abd pn, nausea or vomiting. Last normal BM  This AM without visibile blood.    Vital signs: BP 134/68 (BP Location: Left Arm)   Pulse 71   Temp 97.5 F (36.4 C) (Oral)   Resp 20   Ht 5\' 7"  (1.702 m)   Wt 154 lb 15.7 oz (70.3 kg)   SpO2 99%   BMI 24.27 kg/m   Intake/outpt: I/O last 3 completed shifts: In: 3686.5 [P.O.:960; I.V.:2626.5; IV Piggyback:100] Out: 3800 [Urine:3800]  General: Adult F in NAD Neuro: AA&Ox3. MAE. Follows commands. Less agitated Eyes: PERRL. No scleral icterus  ENT: no stridor  Cardiac: rrr with normal s1s2. No m/r/g  Chest: even, non labored. CTAB  Abd: +BS. Soft, nontender  Ext: MAE, no edema  Skin: warm, dry, intact. No rashes    CMP Latest Ref Rng & Units 12/11/2016 12/10/2016 12/09/2016  Glucose 65 - 99 mg/dL 100(H) 120(H) 108(H)  BUN 6 - 20 mg/dL <5(L) 5(L) 7  Creatinine 0.44 - 1.00 mg/dL 0.50 0.57 0.48  Sodium 135 - 145 mmol/L 140 140 141  Potassium 3.5 - 5.1 mmol/L 4.2 4.4 4.9  Chloride 101 - 111 mmol/L 111 110 112(H)  CO2 22 - 32 mmol/L 23 22 20(L)  Calcium 8.9 - 10.3 mg/dL 8.3(L) 8.3(L) 8.3(L)  Total Protein 6.5 - 8.1 g/dL - 6.3(L) -  Total Bilirubin 0.3 - 1.2 mg/dL - 0.7 -  Alkaline Phos 38 - 126 U/L - 396(H) -  AST 15 - 41 U/L - 62(H) -  ALT 14 - 54 U/L - 26 -    CBC Latest Ref Rng & Units 12/11/2016 12/10/2016 12/09/2016  WBC 4.0 - 10.5 K/uL 8.7 13.5(H) 11.7(H)  Hemoglobin 12.0 - 15.0 g/dL 9.2(L) 8.9(L) 9.7(L)  Hematocrit 36.0 - 46.0 % 28.3(L)  27.3(L) 29.4(L)  Platelets 150 - 400 K/uL 206 228 206    CBG (last 3)  No results for input(s): GLUCAP in the last 72 hours.   No results found.  Studies: EGD 1/27 >> grade 1 esophageal varices, portal HTN gastropathy  Cultures: C diff PCR 1/29 >> antigen positive, toxin negative  Events: 1/27 Admit, GI consulted >> EGD 1/28 started precedex 1/29 off precedex 1/30 back on precedex  Assessment/plan:  Upper GI bleeding with esophageal varices and portal gastropathy. -continue octreotide -continue protonix per GI  Agitated delirium with hx of ETOH. -continue precedex. Try to wean precedex for d/c tomorrow -continue thiamine, folic acid  Acute blood loss anemia 2nd to GI bleed. Thrombocytopenia in setting of ETOH. -Trend CBC -continue iron  C diff antigen positive, but toxin negative. -per primary   Hx of asthma. -continue budesonide and brovana BID with prn albuterol -pt requested for home nebulizer   Deconditioning, unsteady gait. -PT/OT assessment when mental status improves   DVT prophylaxis - SCDs SUP - Protonix Nutrition - soft diet Goals of Care - full code    [facility] Minor ACNP Maryanna Shape PCCM Pager [phone] till 3 pm If no answer page [phone] 12/11/2016, 10:41 AM

## 2016-12-11 NOTE — Evaluation (Signed)
Occupational Therapy Evaluation Patient Details Name: [name] MRN: [id number] DOB: Jan 06, 1946 Today's Date: 12/11/2016    History of Present Illness 72 yo female presented with melena and hematemesis.  She has hx of ETOH and developed progressive agitation,   Clinical Impression   Pt was admitted for the above. She is normally mod I for adls and currently needs min guard for ambulating and min A for LB adls. She will benefit from continued OT. Goals in acute are for supervision/set up.    Follow Up Recommendations  Supervision/Assistance - 24 hour    Equipment Recommendations  None recommended by OT    Recommendations for Other Services       Precautions / Restrictions Precautions Precautions: Fall Restrictions Weight Bearing Restrictions: No      Mobility Bed Mobility Overal bed mobility: Modified Independent Bed Mobility: Supine to Sit          General bed mobility comments: mod I, HOB raised  Transfers Overall transfer level: Needs assistance Equipment used: Rolling walker (2 wheeled) Transfers: Sit to/from [facility] Sit to Stand: Min guard Stand pivot transfers: Min guard       General transfer comment: for safety    Balance Overall balance assessment: Needs assistance;History of Falls Sitting-balance support: Feet supported;No upper extremity supported Sitting balance-Leahy Scale: Fair     Standing balance support: During functional activity;Bilateral upper extremity supported Standing balance-Leahy Scale: Fair                              ADL Overall ADL's : Needs assistance/impaired     Grooming: Wash/dry hands;Min guard;Standing       Lower Body Bathing: Minimal assistance;Sit to/from stand       Lower Body Dressing: Minimal assistance;Sit to/from stand   Toilet Transfer: Min guard;Stand-pivot;BSC;RW   Toileting- Water quality scientist and Hygiene: Min guard;Sit to/from stand         General  ADL Comments: used 3:1 commode due to urgency then walked to bathroom for grooming and to wash glasses.  Pt is able to perform UB adls with set up     Vision     Perception     Praxis      Pertinent Vitals/Pain Pain Assessment: No/denies pain     Hand Dominance Right   Extremity/Trunk Assessment Upper Extremity Assessment Upper Extremity Assessment: Overall WFL for tasks assessed (has tremor but doesn't interfere)      Cervical / Trunk Assessment Cervical / Trunk Assessment: Normal   Communication Communication Communication: No difficulties   Cognition Arousal/Alertness: Awake/alert Behavior During Therapy: WFL for tasks assessed/performed Overall Cognitive Status: Within Functional Limits for tasks assessed Area of Impairment: Orientation Orientation Level: Time                 General Comments       Exercises       Shoulder Instructions      Home Living Family/patient expects to be discharged to:: Private residence Living Arrangements: Spouse/significant other Available Help at Discharge: Family Type of Home: House Home Access: Level entry     Home Layout: One level     Bathroom Shower/Tub: Occupational psychologist: Handicapped height     Home Equipment: Environmental consultant - 4 wheels;Cane - quad;Shower seat   Additional Comments: has H/o l femur fx.      Prior Functioning/Environment Level of Independence: Independent with assistive device(s)  Comments: has walker and quad cane        OT Problem List: Decreased strength;Impaired balance (sitting and/or standing);Decreased activity tolerance   OT Treatment/Interventions: Self-care/ADL training;DME and/or AE instruction;Patient/family education;Balance training    OT Goals(Current goals can be found in the care plan section) Acute Rehab OT Goals Patient Stated Goal: to  get up and walk OT Goal Formulation: With patient Time For Goal Achievement: 12/18/16 Potential to Achieve  Goals: Good ADL Goals Pt Will Perform Lower Body Bathing: with set-up;with supervision;sit to/from stand Pt Will Perform Lower Body Dressing: with set-up;with supervision;sit to/from stand Pt Will Transfer to Toilet: with supervision;ambulating;bedside commode  OT Frequency: Min 2X/week   Barriers to D/C:            Co-evaluation              End of Session    Activity Tolerance: Patient tolerated treatment well Patient left: in bed;with call bell/phone within reach;with bed alarm set   Time: [id number] OT Time Calculation (min): 31 min Charges:  OT General Charges $OT Visit: 1 Procedure OT Evaluation $OT Eval Low Complexity: 1 Procedure OT Treatments $Self Care/Home Management : 8-22 mins G-Codes:    Ashtin Rosner 12/22/16, 3:25 PM  Lesle Chris, OTR/L [phone] 12/22/16

## 2016-12-11 NOTE — Progress Notes (Signed)
[facility] Progress Note  Subjective: No complaints today, no evidence of GI bleeding according to the nurse. Octreotide stopped, more lucent today  Objective: Vital signs in last 24 hours: Temp:  [96 F (35.6 C)-98.1 F (36.7 C)] 97.5 F (36.4 C) (01/31 0800) Pulse Rate:  [64-88] 81 (01/31 1000) Resp:  [11-25] 15 (01/31 1000) BP: (83-172)/(49-100) 134/68 (01/31 0800) SpO2:  [95 %-100 %] 100 % (01/31 1000) Weight change:    PE: Unchanged  Lab Results: Results for orders placed or performed during the hospital encounter of 12/07/16 (from the past 24 hour(s))  CBC     Status: Abnormal   Collection Time: 12/11/16  3:46 AM  Result Value Ref Range   WBC 8.7 4.0 - 10.5 K/uL   RBC 3.06 (L) 3.87 - 5.11 MIL/uL   Hemoglobin 9.2 (L) 12.0 - 15.0 g/dL   HCT 28.3 (L) 36.0 - 46.0 %   MCV 92.5 78.0 - 100.0 fL   MCH 30.1 26.0 - 34.0 pg   MCHC 32.5 30.0 - 36.0 g/dL   RDW 19.0 (H) 11.5 - 15.5 %   Platelets 206 150 - 400 K/uL  Basic metabolic panel     Status: Abnormal   Collection Time: 12/11/16  3:46 AM  Result Value Ref Range   Sodium 140 135 - 145 mmol/L   Potassium 4.2 3.5 - 5.1 mmol/L   Chloride 111 101 - 111 mmol/L   CO2 23 22 - 32 mmol/L   Glucose, Bld 100 (H) 65 - 99 mg/dL   BUN <5 (L) 6 - 20 mg/dL   Creatinine, Ser 0.50 0.44 - 1.00 mg/dL   Calcium 8.3 (L) 8.9 - 10.3 mg/dL   GFR calc non Af Amer >60 >60 mL/min   GFR calc Af Amer >60 >60 mL/min   Anion gap 6 5 - 15    Studies/Results: No results found.    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  Plan: Continue treatment of withdrawal as needed. Continue regular diet. Alcohol cessation We'll sign off for now.    Yarden Hillis C 12/11/2016, 11:05 AM  Pager [phone] If no answer or after 5 PM call [phone]

## 2016-12-11 NOTE — Progress Notes (Signed)
[facility] PROGRESS NOTE    Progress Note  [name]  [id number] DOB: 05-04-1946 DOA: 12/07/2016 PCP: No primary care provider on file.     Brief Narrative:   [name] is an 71 y.o. female with a history of GI bleeding and alcohol abuse presenting for melena and hematemesis. She reports abrupt onset yesterday evening offrequent bloody emesis and dark stoolsassociated with nausea and abdominal discomfort that is moderate, diffuse, worse with emesis. Today she noticed severe lightheadedness and fatigue, in addition to ongoing hematemesis, approximately 5-6 episodes in past 12 hours and 2 small volume dark stools.   She has not been compliant with medications and reports drinking a half gallon of vodka every 2-3 days, which she hides from her husband who believes she has a drinking problem. She stopped drinking within the past few months for a few weeks and noted mild tremor without seizures. Started back drinking due to stress.   Assessment/Plan:   EtOH abuse: - SW consult pending. - CIWA improving - Cont precedex - PRN morphine ordered  Upper GI bleed due to known portal hypertensive gastropathy/esophageal varices in setting of medication noncompliance and alcoholism: - Cont. PPI IV BID, stop octeotride per GI. - no episodes of emesis or BRBPR - Hbg stable. - EGD on 1.27.2018 showed esophageal varices and portal hypertensive gastropathy - KVO IV fluids.  C. Diff colitis: - started on PO vancomycin - pt is not having.  Asthma:  - On room air - Continue formulary dulera - PRN bronchodilator  Hypokalemia - replete with IV potassium - repeat levels pending  DVT prophylaxis: SCD's Family Communication:none Disposition Plan/Barrier to D/C: transfer tmrw on 2.1.2018 Code Status:     Code Status Orders        Start     Ordered   12/07/16 1142  Full code  Continuous     12/07/16 1141    Code Status History    Date Active Date Inactive Code  Status Order ID Comments User Context   10/25/2016  6:04 PM 10/27/2016  6:05 PM Full Code [id number]  Mariel Aloe, MD Inpatient   04/13/2016  9:24 AM 04/18/2016  9:48 PM Full Code [id number]  Edwin Dada, MD Inpatient   04/06/2016  1:19 AM 04/10/2016  5:23 PM Full Code [id number]  Toy Baker, MD Inpatient   01/03/2016  2:35 AM 01/05/2016  3:16 PM Full Code [id number]  Ivor Costa, MD ED   04/22/2015  4:09 AM 04/23/2015  6:44 PM Full Code [id number]  Truett Mainland, DO Inpatient   10/12/2014  5:46 PM 10/13/2014  9:06 PM Full Code [id number]  Tobi Bastos, MD Inpatient   09/19/2014 10:14 AM 09/20/2014  3:42 AM Full Code [id number]  Logan Bores, MD HOV   09/22/2013  6:26 PM 09/30/2013  3:07 PM Full Code [id number]  Orson Eva, MD Inpatient   09/10/2013  6:19 PM 09/14/2013  6:52 PM Full Code [id number]  Sheila Oats, MD Inpatient   11/13/2011  3:25 AM 11/13/2011  5:57 PM Full Code [id number]  Ricky Ala, RN Inpatient        IV Access:    Peripheral IV   Procedures and diagnostic studies:   No results found.   Medical Consultants:    None.  Anti-Infectives:   Vanc  Subjective:    [name] no complain, no diarrhea, no bloody stools.  Objective:    Vitals:   12/11/16 0400 12/11/16 0500 12/11/16 0600 12/11/16 0700  BP: (!) 146/76 118/66 131/67 123/66  Pulse: 64 68 66 65  Resp: 15 13 12 14   Temp:      TempSrc:      SpO2: 98% 98% 98% 98%  Weight:      Height:        Intake/Output Summary (Last 24 hours) at 12/11/16 0743 Last data filed at 12/11/16 0700  Gross per 24 hour  Intake           2066.5 ml  Output              900 ml  Net           1166.5 ml   Filed Weights   12/07/16 1051  Weight: 70.3 kg (154 lb 15.7 oz)    Exam: General exam: In no acute distress. Respiratory system: Good air movement and clear to auscultation. Cardiovascular system: S1 & S2 heard, RRR. No JVD. Gastrointestinal system: Abdomen is nondistended, soft and nontender.    Central nervous system: Alert and oriented. No focal neurological deficits. Extremities: No pedal edema. Psychiatry: Judgement and insight appear normal. Mood & affect appropriate.    Data Reviewed:    Labs: Basic Metabolic Panel:  Recent Labs Lab 12/07/16 0835 12/07/16 0837 12/08/16 0123 12/08/16 0126 12/09/16 1245 12/10/16 0111 12/11/16 0346  NA 134* 134* 139  --  141 140 140  K 4.6 4.6 3.3*  --  4.9 4.4 4.2  CL 99* 100* 109  --  112* 110 111  CO2 22  --  23  --  20* 22 23  GLUCOSE 114* 113* 92  --  108* 120* 100*  BUN 42* 37* 29*  --  7 5* <5*  CREATININE 0.79 0.90 0.65  --  0.48 0.57 0.50  CALCIUM 8.3*  --  7.7*  --  8.3* 8.3* 8.3*  MG  --   --   --  1.3* 1.3* 1.3*  --    GFR Estimated Creatinine Clearance: 65.5 mL/min (by C-G formula based on SCr of 0.5 mg/dL). Liver Function Tests:  Recent Labs Lab 12/07/16 0835 12/10/16 0111  AST 145* 62*  ALT 44 26  ALKPHOS 610* 396*  BILITOT 2.2* 0.7  PROT 6.7 6.3*  ALBUMIN 2.9* 2.8*    Recent Labs Lab 12/07/16 0835  LIPASE 15   No results for input(s): AMMONIA in the last 168 hours. Coagulation profile No results for input(s): INR, PROTIME in the last 168 hours.  CBC:  Recent Labs Lab 12/08/16 0123 12/08/16 0654 12/09/16 1245 12/10/16 0111 12/11/16 0346  WBC 12.0* 9.7 11.7* 13.5* 8.7  HGB 10.4* 10.6* 9.7* 8.9* 9.2*  HCT 29.8* 33.7* 29.4* 27.3* 28.3*  MCV 86.6 86.4 90.5 90.4 92.5  PLT 212 141* 206 228 206   Cardiac Enzymes: No results for input(s): CKTOTAL, CKMB, CKMBINDEX, TROPONINI in the last 168 hours. BNP (last 3 results) No results for input(s): PROBNP in the last 8760 hours. CBG: No results for input(s): GLUCAP in the last 168 hours. D-Dimer: No results for input(s): DDIMER in the last 72 hours. Hgb A1c: No results for input(s): HGBA1C in the last 72 hours. Lipid Profile: No results for input(s): CHOL, HDL, LDLCALC, TRIG, CHOLHDL, LDLDIRECT in the last 72 hours. Thyroid function  studies: No results for input(s): TSH, T4TOTAL, T3FREE, THYROIDAB in the last 72 hours.  Invalid input(s): FREET3 Anemia work up: No results for input(s): VITAMINB12, FOLATE, FERRITIN, TIBC, IRON, RETICCTPCT in the last 72 hours. Sepsis Labs:  Recent Labs Lab 12/07/16  [id number]  12/08/16 0654 12/09/16 1245 12/10/16 0111 12/11/16 0346  WBC  --   < > 9.7 11.7* 13.5* 8.7  LATICACIDVEN 4.27*  --   --   --   --   --   < > = values in this interval not displayed. Microbiology Recent Results (from the past 240 hour(s))  MRSA PCR Screening     Status: None   Collection Time: 12/07/16 10:45 AM  Result Value Ref Range Status   MRSA by PCR NEGATIVE NEGATIVE Final    Comment:        The GeneXpert MRSA Assay (FDA approved for NASAL specimens only), is one component of a comprehensive MRSA colonization surveillance program. It is not intended to diagnose MRSA infection nor to guide or monitor treatment for MRSA infections.   C difficile quick scan w PCR reflex     Status: Abnormal   Collection Time: 12/09/16 12:47 PM  Result Value Ref Range Status   C Diff antigen POSITIVE (A) NEGATIVE Final   C Diff toxin NEGATIVE NEGATIVE Final   C Diff interpretation Results are indeterminate. See PCR results.  Final  Clostridium Difficile by PCR     Status: Abnormal   Collection Time: 12/09/16 12:47 PM  Result Value Ref Range Status   Toxigenic C Difficile by pcr POSITIVE (A) NEGATIVE Final    Comment: Positive for toxigenic C. difficile with little to no toxin production. Only treat if clinical presentation suggests symptomatic illness. Performed at [facility], [place] [address] [place], [place] [address]      Medications:   . arformoterol  15 mcg Nebulization BID  . budesonide (PULMICORT) nebulizer solution  0.5 mg Nebulization BID  . ferrous sulfate  325 mg Oral BID WC  . folic acid  1 mg Intravenous Daily  . pantoprazole (PROTONIX) IVPB  80 mg Intravenous BID  . sodium chloride  flush  3 mL Intravenous Q12H  . thiamine  100 mg Intravenous Daily  . vancomycin  125 mg Oral QID   Continuous Infusions: . sodium chloride 50 mL/hr at 12/11/16 0700  . dexmedetomidine 0.6 mcg/kg/hr (12/11/16 0710)    Time spent:25 min   LOS: 4 days   Charlynne Cousins  [facility] Pager [phone]  *Please refer to [email or web address], password [email or web address] to get updated schedule on who will round on this patient, as hospitalists switch teams weekly. If 7PM-7AM, please contact night-coverage at [email or web address], password [email or web address] for any overnight needs.  12/11/2016, 7:43 AM

## 2016-12-11 NOTE — Progress Notes (Signed)
CSW completed SBIRT with the pt.  CSW offered pt resources for substance abuse/alcohol treatment and pt refused citing "I don't have a problem". Pt further denied the need for any community resources regarding alcohol and/or substances and declined all further information/assistance offers from the CSW.  Please reconsult if future social work needs arise.     Alphonse Guild. Krystine Pabst, Latanya Presser, LCAS Clinical Social Worker Ph: [phone]

## 2016-12-12 DIAGNOSIS — F10231 Alcohol dependence with withdrawal delirium: Secondary | ICD-10-CM

## 2016-12-12 DIAGNOSIS — F10931 Alcohol use, unspecified with withdrawal delirium: Secondary | ICD-10-CM

## 2016-12-12 MED ORDER — DOCUSATE SODIUM 100 MG PO CAPS: 100.0000 mg | ORAL_CAPSULE | Freq: Two times a day (BID) | ORAL | Status: DC | PRN

## 2016-12-12 MED ORDER — DULOXETINE HCL 30 MG PO CPEP
60.0000 mg | ORAL_CAPSULE | Freq: Every day | ORAL | Status: DC
  Administered 2016-12-12 – 2016-12-14 (×3): 60 mg via ORAL
  Filled 2016-12-12 (×3): qty 2

## 2016-12-12 MED ORDER — SIMVASTATIN 40 MG PO TABS
40.0000 mg | ORAL_TABLET | Freq: Every morning | ORAL | Status: DC
  Administered 2016-12-12 – 2016-12-14 (×3): 40 mg via ORAL
  Filled 2016-12-12 (×3): qty 1

## 2016-12-12 MED ORDER — HYDROXYZINE HCL 25 MG PO TABS
25.0000 mg | ORAL_TABLET | Freq: Three times a day (TID) | ORAL | Status: DC | PRN
  Administered 2016-12-12 – 2016-12-13 (×3): 25 mg via ORAL
  Filled 2016-12-12 (×3): qty 1

## 2016-12-12 MED ORDER — PANTOPRAZOLE SODIUM 40 MG PO TBEC
40.0000 mg | DELAYED_RELEASE_TABLET | Freq: Two times a day (BID) | ORAL | Status: DC
  Administered 2016-12-12 – 2016-12-14 (×5): 40 mg via ORAL
  Filled 2016-12-12 (×5): qty 1

## 2016-12-12 MED ORDER — VITAMIN B-1 100 MG PO TABS
100.0000 mg | ORAL_TABLET | Freq: Every day | ORAL | Status: DC
  Administered 2016-12-13 – 2016-12-14 (×2): 100 mg via ORAL
  Filled 2016-12-12 (×2): qty 1

## 2016-12-12 MED ORDER — PROPRANOLOL HCL 10 MG PO TABS
10.0000 mg | ORAL_TABLET | Freq: Two times a day (BID) | ORAL | Status: DC
  Administered 2016-12-12 – 2016-12-14 (×5): 10 mg via ORAL
  Filled 2016-12-12: qty 0.5
  Filled 2016-12-12: qty 1
  Filled 2016-12-12: qty 0.5
  Filled 2016-12-12 (×2): qty 1

## 2016-12-12 MED ORDER — FOLIC ACID 1 MG PO TABS
1.0000 mg | ORAL_TABLET | Freq: Every day | ORAL | Status: DC
  Administered 2016-12-13 – 2016-12-14 (×2): 1 mg via ORAL
  Filled 2016-12-12 (×2): qty 1

## 2016-12-12 MED ORDER — PAROXETINE HCL 10 MG PO TABS
5.0000 mg | ORAL_TABLET | Freq: Every day | ORAL | Status: DC
  Administered 2016-12-12 – 2016-12-14 (×3): 5 mg via ORAL
  Filled 2016-12-12 (×3): qty 0.5

## 2016-12-12 MED ORDER — ZOLPIDEM TARTRATE 5 MG PO TABS: 5.0000 mg | ORAL_TABLET | Freq: Every evening | ORAL | Status: DC | PRN

## 2016-12-12 MED ORDER — HYDROCODONE-ACETAMINOPHEN 5-325 MG PO TABS: 1.0000 | ORAL_TABLET | Freq: Four times a day (QID) | ORAL | Status: DC | PRN

## 2016-12-12 MED ORDER — MAGNESIUM OXIDE 400 (241.3 MG) MG PO TABS: 400.0000 mg | ORAL_TABLET | Freq: Every day | ORAL | Status: DC | PRN

## 2016-12-12 MED ORDER — SUCRALFATE 1 G PO TABS
1.0000 g | ORAL_TABLET | Freq: Four times a day (QID) | ORAL | Status: DC
  Administered 2016-12-12 – 2016-12-14 (×10): 1 g via ORAL
  Filled 2016-12-12 (×10): qty 1

## 2016-12-12 NOTE — Progress Notes (Signed)
[facility] PROGRESS NOTE    Progress Note  [name]  [id number] DOB: [birth year] DOA: 12/07/2016 PCP: No primary care provider on file.     Brief Narrative:   [name] is an 71 y.o. female with a history of GI bleeding and alcohol abuse presenting for melena and hematemesis. She reports abrupt onset yesterday evening offrequent bloody emesis and dark stoolsassociated with nausea and abdominal discomfort that is moderate, diffuse, worse with emesis. Today she noticed severe lightheadedness and fatigue, in addition to ongoing hematemesis, approximately 5-6 episodes in past 12 hours and 2 small volume dark stools.   She has not been compliant with medications and reports drinking a half gallon of vodka every 2-3 days, which she hides from her husband who believes she has a drinking problem. She stopped drinking within the past few months for a few weeks and noted mild tremor without seizures. Started back drinking due to stress.   Assessment/Plan:   EtOH abuse: - SW consult pending. - Cont. CIWA with ativan. - D/c precedex and morphine.  Upper GI bleed due to known portal hypertensive gastropathy/esophageal varices in setting of medication noncompliance and alcoholism: - CHange PPI to oral BID. - transfer to med surg. - Hbg stable. - EGD on 1.27.2018 showed esophageal varices and portal hypertensive gastropathy - KVO IV fluids.  C. Diff colitis: - started on PO vancomycin - pt is not having diarrhea.  Asthma:  - On room air - Continue formulary dulera - PRN bronchodilator  Hypokalemia - replete with IV potassium - repeat levels pending  DVT prophylaxis: SCD's Family Communication:none Disposition Plan/Barrier to D/C: transfer tmrw on 2.1.2018 Code Status:     Code Status Orders        Start     Ordered   12/07/16 1142  Full code  Continuous     12/07/16 1141    Code Status History    Date Active Date Inactive Code Status Order ID Comments  User Context   10/25/2016  6:04 PM 10/27/2016  6:05 PM Full Code [id number]  Mariel Aloe, MD Inpatient   04/13/2016  9:24 AM 04/18/2016  9:48 PM Full Code [id number]  Edwin Dada, MD Inpatient   04/06/2016  1:19 AM 04/10/2016  5:23 PM Full Code [id number]  Toy Baker, MD Inpatient   01/03/2016  2:35 AM 01/05/2016  3:16 PM Full Code [id number]  Ivor Costa, MD ED   04/22/2015  4:09 AM 04/23/2015  6:44 PM Full Code [id number]  Truett Mainland, DO Inpatient   10/12/2014  5:46 PM 10/13/2014  9:06 PM Full Code [id number]  Tobi Bastos, MD Inpatient   09/19/2014 10:14 AM 09/20/2014  3:42 AM Full Code [id number]  Logan Bores, MD HOV   09/22/2013  6:26 PM 09/30/2013  3:07 PM Full Code [id number]  Orson Eva, MD Inpatient   09/10/2013  6:19 PM 09/14/2013  6:52 PM Full Code [id number]  Sheila Oats, MD Inpatient   11/13/2011  3:25 AM 11/13/2011  5:57 PM Full Code [id number]  Ricky Ala, RN Inpatient        IV Access:    Peripheral IV   Procedures and diagnostic studies:   No results found.   Medical Consultants:    None.  Anti-Infectives:   Vanc  Subjective:    Annjanette P Matty no complain, no diarrhea, no bloody stools.  Objective:    Vitals:   12/11/16 2336 12/11/16 2337 12/12/16 0401 12/12/16 0500  BP:  135/85   Pulse: (!) 117   (!) 109  Resp: (!) 26   (!) 25  Temp:  98.3 F (36.8 C)  98.6 F (37 C)  TempSrc:  Oral  Oral  SpO2: 98%   97%  Weight:      Height:        Intake/Output Summary (Last 24 hours) at 12/12/16 0725 Last data filed at 12/12/16 0305  Gross per 24 hour  Intake          2346.17 ml  Output             1301 ml  Net          1045.17 ml   Filed Weights   12/07/16 1051  Weight: 70.3 kg (154 lb 15.7 oz)    Exam: General exam: In no acute distress. Respiratory system: Good air movement and clear to auscultation. Cardiovascular system: S1 & S2 heard, RRR. No JVD. Gastrointestinal system: Abdomen is nondistended, soft and nontender.    Extremities: No pedal edema. Psychiatry: Judgement and insight appear normal. Mood & affect appropriate.    Data Reviewed:    Labs: Basic Metabolic Panel:  Recent Labs Lab 12/07/16 0835 12/07/16 0837 12/08/16 0123 12/08/16 0126 12/09/16 1245 12/10/16 0111 12/11/16 0346  NA 134* 134* 139  --  141 140 140  K 4.6 4.6 3.3*  --  4.9 4.4 4.2  CL 99* 100* 109  --  112* 110 111  CO2 22  --  23  --  20* 22 23  GLUCOSE 114* 113* 92  --  108* 120* 100*  BUN 42* 37* 29*  --  7 5* <5*  CREATININE 0.79 0.90 0.65  --  0.48 0.57 0.50  CALCIUM 8.3*  --  7.7*  --  8.3* 8.3* 8.3*  MG  --   --   --  1.3* 1.3* 1.3*  --    GFR Estimated Creatinine Clearance: 65.5 mL/min (by C-G formula based on SCr of 0.5 mg/dL). Liver Function Tests:  Recent Labs Lab 12/07/16 0835 12/10/16 0111  AST 145* 62*  ALT 44 26  ALKPHOS 610* 396*  BILITOT 2.2* 0.7  PROT 6.7 6.3*  ALBUMIN 2.9* 2.8*    Recent Labs Lab 12/07/16 0835  LIPASE 15   No results for input(s): AMMONIA in the last 168 hours. Coagulation profile No results for input(s): INR, PROTIME in the last 168 hours.  CBC:  Recent Labs Lab 12/08/16 0123 12/08/16 0654 12/09/16 1245 12/10/16 0111 12/11/16 0346  WBC 12.0* 9.7 11.7* 13.5* 8.7  HGB 10.4* 10.6* 9.7* 8.9* 9.2*  HCT 29.8* 33.7* 29.4* 27.3* 28.3*  MCV 86.6 86.4 90.5 90.4 92.5  PLT 212 141* 206 228 206   Cardiac Enzymes: No results for input(s): CKTOTAL, CKMB, CKMBINDEX, TROPONINI in the last 168 hours. BNP (last 3 results) No results for input(s): PROBNP in the last 8760 hours. CBG: No results for input(s): GLUCAP in the last 168 hours. D-Dimer: No results for input(s): DDIMER in the last 72 hours. Hgb A1c: No results for input(s): HGBA1C in the last 72 hours. Lipid Profile: No results for input(s): CHOL, HDL, LDLCALC, TRIG, CHOLHDL, LDLDIRECT in the last 72 hours. Thyroid function studies: No results for input(s): TSH, T4TOTAL, T3FREE, THYROIDAB in the last 72  hours.  Invalid input(s): FREET3 Anemia work up: No results for input(s): VITAMINB12, FOLATE, FERRITIN, TIBC, IRON, RETICCTPCT in the last 72 hours. Sepsis Labs:  Recent Labs Lab 12/07/16 0837  12/08/16 0654 12/09/16 1245 12/10/16  0111 12/11/16 0346  WBC  --   < > 9.7 11.7* 13.5* 8.7  LATICACIDVEN 4.27*  --   --   --   --   --   < > = values in this interval not displayed. Microbiology Recent Results (from the past 240 hour(s))  MRSA PCR Screening     Status: None   Collection Time: 12/07/16 10:45 AM  Result Value Ref Range Status   MRSA by PCR NEGATIVE NEGATIVE Final    Comment:        The GeneXpert MRSA Assay (FDA approved for NASAL specimens only), is one component of a comprehensive MRSA colonization surveillance program. It is not intended to diagnose MRSA infection nor to guide or monitor treatment for MRSA infections.   C difficile quick scan w PCR reflex     Status: Abnormal   Collection Time: 12/09/16 12:47 PM  Result Value Ref Range Status   C Diff antigen POSITIVE (A) NEGATIVE Final   C Diff toxin NEGATIVE NEGATIVE Final   C Diff interpretation Results are indeterminate. See PCR results.  Final  Clostridium Difficile by PCR     Status: Abnormal   Collection Time: 12/09/16 12:47 PM  Result Value Ref Range Status   Toxigenic C Difficile by pcr POSITIVE (A) NEGATIVE Final    Comment: Positive for toxigenic C. difficile with little to no toxin production. Only treat if clinical presentation suggests symptomatic illness. Performed at [facility], [place] [address], [place], [place] [address]      Medications:   . arformoterol  15 mcg Nebulization BID  . budesonide (PULMICORT) nebulizer solution  0.5 mg Nebulization BID  . ferrous sulfate  325 mg Oral BID WC  . folic acid  1 mg Intravenous Daily  . pantoprazole (PROTONIX) IVPB  80 mg Intravenous BID  . sodium chloride flush  3 mL Intravenous Q12H  . thiamine  100 mg Intravenous Daily  . vancomycin   125 mg Oral QID   Continuous Infusions: . sodium chloride 50 mL/hr at 12/12/16 0305    Time spent:25 min   LOS: 5 days   Charlynne Cousins  [facility] Pager [phone]  *Please refer to [email or web address], password [email or web address] to get updated schedule on who will round on this patient, as hospitalists switch teams weekly. If 7PM-7AM, please contact night-coverage at [email or web address], password [email or web address] for any overnight needs.  12/12/2016, 7:25 AM

## 2016-12-12 NOTE — Progress Notes (Signed)
Admission date: 12/07/2016 Referring provider: Dr. Adair Patter, [facility]  CC: GI bleeding  HPI: 72 yo female presented with melena and hematemesis.  She has hx of ETOH and developed progressive agitation.  PMHx of Asthma, GERD, HLD, Insomnia, Psoriasis, Renal cell CA 2012 s/p partial nephrectomy  Subjective: Off precedex. Awake and alert. Nice lady   Vital signs: BP (!) 157/75 (BP Location: Left Arm)   Pulse (!) 109   Temp 98.1 F (36.7 C) (Oral)   Resp (!) 22   Ht 5\' 7"  (1.702 m)   Wt 154 lb 15.7 oz (70.3 kg)   SpO2 98%   BMI 24.27 kg/m     Intake/outpt:  Intake/Output Summary (Last 24 hours) at 12/12/16 0953 Last data filed at 12/12/16 0800  Gross per 24 hour  Intake          2045.67 ml  Output             1301 ml  Net           744.67 ml    General: Adult F in NAD Neuro: AA&Ox3. MAE. Follows commands. Alert and cooperative, off precedex  Eyes: PERRL. No scleral icterus  ENT: no stridor  Cardiac: rrr with normal s1s2. No m/r/g  Chest: even, non labored. CTAB  Abd: +BS. Soft, nontender  Ext: MAE, no edema  Skin: warm, dry, intact. No rashes    CMP Latest Ref Rng & Units 12/11/2016 12/10/2016 12/09/2016  Glucose 65 - 99 mg/dL 100(H) 120(H) 108(H)  BUN 6 - 20 mg/dL <5(L) 5(L) 7  Creatinine 0.44 - 1.00 mg/dL 0.50 0.57 0.48  Sodium 135 - 145 mmol/L 140 140 141  Potassium 3.5 - 5.1 mmol/L 4.2 4.4 4.9  Chloride 101 - 111 mmol/L 111 110 112(H)  CO2 22 - 32 mmol/L 23 22 20(L)  Calcium 8.9 - 10.3 mg/dL 8.3(L) 8.3(L) 8.3(L)  Total Protein 6.5 - 8.1 g/dL - 6.3(L) -  Total Bilirubin 0.3 - 1.2 mg/dL - 0.7 -  Alkaline Phos 38 - 126 U/L - 396(H) -  AST 15 - 41 U/L - 62(H) -  ALT 14 - 54 U/L - 26 -    CBC Latest Ref Rng & Units 12/11/2016 12/10/2016 12/09/2016  WBC 4.0 - 10.5 K/uL 8.7 13.5(H) 11.7(H)  Hemoglobin 12.0 - 15.0 g/dL 9.2(L) 8.9(L) 9.7(L)  Hematocrit 36.0 - 46.0 % 28.3(L) 27.3(L) 29.4(L)  Platelets 150 - 400 K/uL 206 228 206    CBG (last 3)  No results for  input(s): GLUCAP in the last 72 hours.   No results found.  Studies: EGD 1/27 >> grade 1 esophageal varices, portal HTN gastropathy  Cultures: C diff PCR 1/29 >> antigen positive, toxin negative  Events: 1/27 Admit, GI consulted >> EGD 1/28 started precedex 1/29 off precedex 1/30 back on precedex  Assessment/plan:  Upper GI bleeding with esophageal varices and portal gastropathy. -continue octreotide -continue protonix per GI  Agitated delirium with hx of ETOH. Resolved -Off precedex 2/1 -continue thiamine, folic acid  Acute blood loss anemia 2nd to GI bleed. Thrombocytopenia in setting of ETOH. -Trend CBC -continue iron  C diff antigen positive, but toxin negative. -per primary   Hx of asthma. -continue budesonide and brovana BID with prn albuterol -pt requested for home nebulizer   Deconditioning, unsteady gait. -PT/OT assessment   2/1 PCCM will sign off.    DVT prophylaxis - SCDs SUP - Protonix Nutrition - soft diet Goals of Care - full code    Richardson Landry Lekha Dancer  ACNP [facility] P[facility] Pager [phone] till 3 pm If no answer page [phone] 12/12/2016, 9:50 AM

## 2016-12-12 NOTE — Progress Notes (Signed)
PHARMACIST - PHYSICIAN COMMUNICATION  DR:   Venetia Constable  CONCERNING: IV to Oral Route Change Policy  RECOMMENDATION: This patient is receiving folate and thiamine by the intravenous route.  Based on criteria approved by the Pharmacy and Therapeutics Committee, the intravenous medication(s) is/are being converted to the equivalent oral dose form(s).   DESCRIPTION: These criteria include:  The patient is eating (either orally or via tube) and/or has been taking other orally administered medications for a least 24 hours  The patient has no evidence of active gastrointestinal bleeding or impaired GI absorption (gastrectomy, short bowel, patient on TNA or NPO).  If you have questions about this conversion, please contact the Pharmacy Department  []   [phone] )  [facility] []   [phone] )  [facility] []   [phone] )  [facility] []   [phone] )  [facility] [x]   [phone] )  [place], [place], [facility] 12/12/2016 12:26 PM

## 2016-12-12 NOTE — Progress Notes (Signed)
Report given to Shanon Brow, RN on [facility]east.  Pt transported with no issues.  All belongings sent with patient.

## 2016-12-13 DIAGNOSIS — A0472 Enterocolitis due to Clostridium difficile, not specified as recurrent: Secondary | ICD-10-CM

## 2016-12-13 MED ORDER — SACCHAROMYCES BOULARDII 250 MG PO CAPS
250.0000 mg | ORAL_CAPSULE | Freq: Two times a day (BID) | ORAL | Status: DC
  Administered 2016-12-13 – 2016-12-14 (×2): 250 mg via ORAL
  Filled 2016-12-13 (×2): qty 1

## 2016-12-13 MED ORDER — DIPHENOXYLATE-ATROPINE 2.5-0.025 MG PO TABS: 1.0000 | ORAL_TABLET | Freq: Four times a day (QID) | ORAL | Status: DC | PRN

## 2016-12-13 MED ORDER — CHOLESTYRAMINE 4 G PO PACK
4.0000 g | PACK | Freq: Four times a day (QID) | ORAL | Status: DC
  Administered 2016-12-13 – 2016-12-14 (×5): 4 g via ORAL
  Filled 2016-12-13 (×6): qty 1

## 2016-12-13 MED ORDER — METRONIDAZOLE 500 MG PO TABS
500.0000 mg | ORAL_TABLET | Freq: Three times a day (TID) | ORAL | Status: DC
  Administered 2016-12-13 – 2016-12-14 (×4): 500 mg via ORAL
  Filled 2016-12-13 (×4): qty 1
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
## 2016-12-13 NOTE — Progress Notes (Signed)
PT Cancellation Note  Patient Details Name: GIA SEES MRN: [id number] DOB: [birth year]   Cancelled Treatment:    Reason Eval/Treat Not Completed: Other (comment) (patient up ad lib in the room.)   Claretha Cooper 12/13/2016, 4:55 PM
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
## 2016-12-13 NOTE — Progress Notes (Signed)
[facility] PROGRESS NOTE    Progress Note  [name]  [id number] DOB: June 20, 1946 DOA: 12/07/2016 PCP: No primary care provider on file.     Brief Narrative:   [name] is an 71 y.o. female with a history of GI bleeding and alcohol abuse presenting for melena and hematemesis. She reports abrupt onset yesterday evening offrequent bloody emesis and dark stoolsassociated with nausea and abdominal discomfort that is moderate, diffuse, worse with emesis. Today she noticed severe lightheadedness and fatigue, in addition to ongoing hematemesis, approximately 5-6 episodes in past 12 hours and 2 small volume dark stools.   She has not been compliant with medications and reports drinking a half gallon of vodka every 2-3 days, which she hides from her husband who believes she has a drinking problem. She stopped drinking within the past few months for a few weeks and noted mild tremor without seizures. Started back drinking due to stress.   Assessment/Plan:   EtOH abuse: - SW consult pending. - resolved.  Upper GI bleed due to known portal hypertensive gastropathy/esophageal varices in setting of medication noncompliance and alcoholism: - Cange PPI to oral BID. - Hbg stable. - EGD on 1.27.2018 showed esophageal varices and portal hypertensive gastropathy - Resume IV fluid  C. Diff colitis: - cont to have watery diarrhea. - started on PO vancomycin, I will add flagyl. - now with new watery diarrhea. Start cholestyramine and florastor.  Asthma:  - On room air - Continue formulary dulera - PRN bronchodilator  Hypokalemia - Resolved with repletion.  DVT prophylaxis: SCD's Family Communication:none Disposition Plan/Barrier to D/C:  tmrw on 2.3.2018 once diarrhea improved. Code Status:     Code Status Orders        Start     Ordered   12/07/16 1142  Full code  Continuous     12/07/16 1141    Code Status History    Date Active Date Inactive Code Status Order  ID Comments User Context   10/25/2016  6:04 PM 10/27/2016  6:05 PM Full Code [id number]  Mariel Aloe, MD Inpatient   04/13/2016  9:24 AM 04/18/2016  9:48 PM Full Code [id number]  Edwin Dada, MD Inpatient   04/06/2016  1:19 AM 04/10/2016  5:23 PM Full Code [id number]  Toy Baker, MD Inpatient   01/03/2016  2:35 AM 01/05/2016  3:16 PM Full Code [id number]  Ivor Costa, MD ED   04/22/2015  4:09 AM 04/23/2015  6:44 PM Full Code [id number]  Truett Mainland, DO Inpatient   10/12/2014  5:46 PM 10/13/2014  9:06 PM Full Code [id number]  Tobi Bastos, MD Inpatient   09/19/2014 10:14 AM 09/20/2014  3:42 AM Full Code [id number]  Logan Bores, MD HOV   09/22/2013  6:26 PM 09/30/2013  3:07 PM Full Code [id number]  Orson Eva, MD Inpatient   09/10/2013  6:19 PM 09/14/2013  6:52 PM Full Code [id number]  Sheila Oats, MD Inpatient   11/13/2011  3:25 AM 11/13/2011  5:57 PM Full Code [id number]  Ricky Ala, RN Inpatient        IV Access:    Peripheral IV   Procedures and diagnostic studies:   No results found.   Medical Consultants:    None.  Anti-Infectives:   Vanc  Subjective:    [name] no complain, multiple BM, none bloody stools.  Objective:    Vitals:   12/12/16 1518 12/12/16 1948 12/13/16 0627 12/13/16 0747  BP: (!) 153/90 127/73  124/75   Pulse: 75 80 83   Resp: (!) 22 20 18    Temp: 98.3 F (36.8 C) 98.1 F (36.7 C) 98.7 F (37.1 C)   TempSrc: Oral Oral Oral   SpO2: 100% 99% 99% 95%  Weight:      Height:        Intake/Output Summary (Last 24 hours) at 12/13/16 1232 Last data filed at 12/13/16 0954  Gross per 24 hour  Intake              360 ml  Output                0 ml  Net              360 ml   Filed Weights   12/07/16 1051  Weight: 70.3 kg (154 lb 15.7 oz)    Exam: General exam: In no acute distress. Respiratory system: Good air movement and clear to auscultation. Cardiovascular system: S1 & S2 heard, RRR. No JVD. Gastrointestinal system:  Abdomen is nondistended, soft and nontender.  Extremities: No pedal edema. Psychiatry: Judgement and insight appear normal. Mood & affect appropriate.    Data Reviewed:    Labs: Basic Metabolic Panel:  Recent Labs Lab 12/07/16 0835 12/07/16 0837 12/08/16 0123 12/08/16 0126 12/09/16 1245 12/10/16 0111 12/11/16 0346  NA 134* 134* 139  --  141 140 140  K 4.6 4.6 3.3*  --  4.9 4.4 4.2  CL 99* 100* 109  --  112* 110 111  CO2 22  --  23  --  20* 22 23  GLUCOSE 114* 113* 92  --  108* 120* 100*  BUN 42* 37* 29*  --  7 5* <5*  CREATININE 0.79 0.90 0.65  --  0.48 0.57 0.50  CALCIUM 8.3*  --  7.7*  --  8.3* 8.3* 8.3*  MG  --   --   --  1.3* 1.3* 1.3*  --    GFR Estimated Creatinine Clearance: 65.5 mL/min (by C-G formula based on SCr of 0.5 mg/dL). Liver Function Tests:  Recent Labs Lab 12/07/16 0835 12/10/16 0111  AST 145* 62*  ALT 44 26  ALKPHOS 610* 396*  BILITOT 2.2* 0.7  PROT 6.7 6.3*  ALBUMIN 2.9* 2.8*    Recent Labs Lab 12/07/16 0835  LIPASE 15   No results for input(s): AMMONIA in the last 168 hours. Coagulation profile No results for input(s): INR, PROTIME in the last 168 hours.  CBC:  Recent Labs Lab 12/08/16 0123 12/08/16 0654 12/09/16 1245 12/10/16 0111 12/11/16 0346  WBC 12.0* 9.7 11.7* 13.5* 8.7  HGB 10.4* 10.6* 9.7* 8.9* 9.2*  HCT 29.8* 33.7* 29.4* 27.3* 28.3*  MCV 86.6 86.4 90.5 90.4 92.5  PLT 212 141* 206 228 206   Cardiac Enzymes: No results for input(s): CKTOTAL, CKMB, CKMBINDEX, TROPONINI in the last 168 hours. BNP (last 3 results) No results for input(s): PROBNP in the last 8760 hours. CBG: No results for input(s): GLUCAP in the last 168 hours. D-Dimer: No results for input(s): DDIMER in the last 72 hours. Hgb A1c: No results for input(s): HGBA1C in the last 72 hours. Lipid Profile: No results for input(s): CHOL, HDL, LDLCALC, TRIG, CHOLHDL, LDLDIRECT in the last 72 hours. Thyroid function studies: No results for input(s):  TSH, T4TOTAL, T3FREE, THYROIDAB in the last 72 hours.  Invalid input(s): FREET3 Anemia work up: No results for input(s): VITAMINB12, FOLATE, FERRITIN, TIBC, IRON, RETICCTPCT in the last 72 hours. Sepsis Labs:  Recent  Labs Lab 12/07/16 0837  12/08/16 0654 12/09/16 1245 12/10/16 0111 12/11/16 0346  WBC  --   < > 9.7 11.7* 13.5* 8.7  LATICACIDVEN 4.27*  --   --   --   --   --   < > = values in this interval not displayed. Microbiology Recent Results (from the past 240 hour(s))  MRSA PCR Screening     Status: None   Collection Time: 12/07/16 10:45 AM  Result Value Ref Range Status   MRSA by PCR NEGATIVE NEGATIVE Final    Comment:        The GeneXpert MRSA Assay (FDA approved for NASAL specimens only), is one component of a comprehensive MRSA colonization surveillance program. It is not intended to diagnose MRSA infection nor to guide or monitor treatment for MRSA infections.   C difficile quick scan w PCR reflex     Status: Abnormal   Collection Time: 12/09/16 12:47 PM  Result Value Ref Range Status   C Diff antigen POSITIVE (A) NEGATIVE Final   C Diff toxin NEGATIVE NEGATIVE Final   C Diff interpretation Results are indeterminate. See PCR results.  Final  Clostridium Difficile by PCR     Status: Abnormal   Collection Time: 12/09/16 12:47 PM  Result Value Ref Range Status   Toxigenic C Difficile by pcr POSITIVE (A) NEGATIVE Final    Comment: Positive for toxigenic C. difficile with little to no toxin production. Only treat if clinical presentation suggests symptomatic illness. Performed at [facility], [place] [address] [place], [place] [id number]      Medications:   . arformoterol  15 mcg Nebulization BID  . budesonide (PULMICORT) nebulizer solution  0.5 mg Nebulization BID  . DULoxetine  60 mg Oral Daily  . ferrous sulfate  325 mg Oral BID WC  . folic acid  1 mg Oral Daily  . pantoprazole  40 mg Oral BID  . PARoxetine  5 mg Oral Daily  . propranolol  10 mg  Oral BID  . simvastatin  40 mg Oral q morning - 10a  . sodium chloride flush  3 mL Intravenous Q12H  . sucralfate  1 g Oral QID  . thiamine  100 mg Oral Daily  . vancomycin  125 mg Oral QID   Continuous Infusions:   Time spent:25 min   LOS: 6 days   Charlynne Cousins  [facility] Pager [phone]  *Please refer to [email or web address], password [email or web address] to get updated schedule on who will round on this patient, as hospitalists switch teams weekly. If 7PM-7AM, please contact night-coverage at [email or web address], password [email or web address] for any overnight needs.  12/13/2016, 12:32 PM

## 2016-12-13 NOTE — Evaluation (Addendum)
Physical Therapy Evaluation/ Late entry for  12/11/16. Patient Details Name: ADELIN BRODMAN MRN: [id number] DOB: 1945/11/18 Today's Date: 12/13/2016   History of Present Illness  71 yo female presented with melena and hematemesis.  She has hx of ETOH and developed progressive agitation,  Clinical Impression  The patient is quite weak but motivated to perform  Activities on her on. Pt admitted with above diagnosis. Pt currently with functional limitations due to the deficits listed below (see PT Problem List).  Pt will benefit from skilled PT to increase their independence and safety with mobility to allow discharge to the venue listed below.       Follow Up Recommendations SNF;Home health PT (need to see progress.)    Equipment Recommendations  None recommended by PT    Recommendations for Other Services       Precautions / Restrictions Precautions Precautions: Fall      Mobility  Bed Mobility Cues for safety for supine to sitting                  Transfers mod assist to stand and pivot to [facility] then Recliner, cues  For safety.                    Ambulation/Gait                Stairs            Wheelchair Mobility    Modified Rankin (Stroke Patients Only)       Balance Overall balance assessment: Needs assistance;History of Falls Sitting-balance support: Feet supported;No upper extremity supported Sitting balance-Leahy Scale: Fair     Standing balance support: During functional activity;Bilateral upper extremity supported Standing balance-Leahy Scale: Fair                               Pertinent Vitals/Pain Pain Assessment: No/denies pain    Home Living Family/patient expects to be discharged to:: Private residence Living Arrangements: Spouse/significant other Available Help at Discharge: Family Type of Home: House Home Access: Level entry     Home Layout: One level Home Equipment: Environmental consultant - 4 wheels;Cane -  quad Additional Comments: has H/o l femur fx.    Prior Function Level of Independence: Independent with assistive device(s)               Hand Dominance        Extremity/Trunk Assessment    generalized weakness both legs            Communication   Communication: No difficulties  Cognition Arousal/Alertness: Awake/alert Behavior During Therapy: WFL for tasks assessed/performed Overall Cognitive Status: Within Functional Limits for tasks assessed                      General Comments      Exercises     Assessment/Plan    PT Assessment Patient needs continued PT services  PT Problem List Decreased strength;Decreased activity tolerance;Decreased balance;Decreased mobility;Decreased knowledge of precautions;Decreased safety awareness;Decreased cognition          PT Treatment Interventions DME instruction;Gait training;Functional mobility training;Therapeutic activities;Therapeutic exercise;Patient/family education    PT Goals (Current goals can be found in the Care Plan section)  Acute Rehab PT Goals Patient Stated Goal: to  get up and walk PT Goal Formulation: With patient Time For Goal Achievement: 12/25/16 Potential to Achieve Goals: Good    Frequency Min 3X/week  Barriers to discharge        Co-evaluation               End of Session   Activity Tolerance: Patient tolerated treatment well Patient left: with call bell/phone within reach;with chair alarm set;with nursing/sitter in room Nurse Communication: Mobility status         Time:  [id number]     Charges:  eval low 2 therapeutic activity       PT G Codes:        Claretha Cooper 12/13/2016, 2:28 PM

## 2016-12-14 MED ORDER — METRONIDAZOLE 500 MG PO TABS: 500.0000 mg | ORAL_TABLET | Freq: Three times a day (TID) | ORAL | 0 refills | Status: DC

## 2016-12-14 MED ORDER — VANCOMYCIN 50 MG/ML ORAL SOLUTION: 125.0000 mg | Freq: Four times a day (QID) | ORAL | 0 refills | Status: AC

## 2016-12-14 MED ORDER — CHOLESTYRAMINE 4 G PO PACK: 4.0000 g | PACK | Freq: Two times a day (BID) | ORAL | 0 refills | Status: DC

## 2016-12-14 NOTE — Discharge Summary (Addendum)
Physician Discharge Summary  [name] [id number] DOB: Jun 01, 1946 DOA: 12/07/2016  PCP: No primary care provider on file.  Admit date: 12/07/2016 Discharge date: 12/14/2016  Admitted From: home Disposition:  Home   Recommendations for Outpatient Follow-up:  1. Follow up with PCP in 1-2 weeks 2. Please obtain BMP/CBC in one week 3. Please follow up on the following pending results:  Home Health:No Equipment/Devices:none  Discharge Condition:stable CODE STATUS:full Diet recommendation: Heart Healthy  Brief/Interim Summary: 71 y.o. female with a history of GI bleeding and alcohol abuse presenting for melena and hematemesis. She reports abrupt onset yesterday evening offrequent bloody emesis and dark stoolsassociated with nausea and abdominal discomfort that is moderate, diffuse, worse with emesis. Today she noticed severe lightheadedness and fatigue, in addition to ongoing hematemesis, approximately 5-6 episodes in past 12 hours and 2 small volume dark stools.   She has not been compliant with medications and reports drinking a half gallon of vodka every 2-3 days, which she hides from her husband who believes she has a drinking problem. She stopped drinking within the past few months for a few weeks and noted mild tremor without seizures. Started back drinking due to stress.    Discharge Diagnoses:  Principal Problem:   UGI bleed Active Problems:   Asthma   Alcohol abuse   Tobacco abuse   Depression   Acute blood loss anemia   Acute alcohol abuse, with unspecified complication (HCC)   Tachypnea   Alcohol withdrawal with delirium in inpatient treatment ([facility])   C. difficile colitis  Alcohol withdrawal wih Delirium in inpatient treatment: - Started having withdrawal symptoms started on precedex. With improvement. counseled about drinking.  Upper GI bleed due to known portal hypertensive gastropathy/esophageal varices in setting of medication noncompliance and  alcoholism: - Related to known portal hypertensive gastropathy/esophageal varices in setting of medication noncompliance and alcoholism - started on Octreotide, PPI IV on admission. - GI consulted rec - EGD perform on 1.27.2018 showed esophageal varices and portal hypertensive gastropathy - patient asking about facility to help with alcohol cessation (SW consulted) - S/p 1u PRBCs - Hbg remained stable, follow up with GI as an outpatient.  C. Diff colitis: - started on PO vancomycin with no improvement. Start flagyl and cholestyramine and bowel more formed and slow down.  Asthma:  - On room air - Continue formulary dulera - PRN bronchodilator  Hypokalemia - Resolved with repletion.   Discharge Instructions  Discharge Instructions    Diet - low sodium heart healthy    Complete by:  As directed    Increase activity slowly    Complete by:  As directed      Allergies as of 12/14/2016      Reactions   Azithromycin Itching, Swelling   Morphine And Related Itching      Medication List    STOP taking these medications   docusate sodium 100 MG capsule Commonly known as:  COLACE   polyethylene glycol packet Commonly known as:  MIRALAX / GLYCOLAX     TAKE these medications   albuterol 108 (90 Base) MCG/ACT inhaler Commonly known as:  PROVENTIL HFA;VENTOLIN HFA Inhale 2 puffs into the lungs every 6 (six) hours as needed for wheezing or shortness of breath.   cholestyramine 4 g packet Commonly known as:  QUESTRAN Take 1 packet (4 g total) by mouth 2 (two) times daily.   cyclobenzaprine 10 MG tablet Commonly known as:  FLEXERIL Take 10 mg by mouth 3 (three) times daily as  needed for muscle spasms.   DULoxetine 60 MG capsule Commonly known as:  CYMBALTA Take 1 capsule (60 mg total) by mouth daily.   famotidine 20 MG tablet Commonly known as:  PEPCID Take 20 mg by mouth 2 (two) times daily.   ferrous sulfate 325 (65 FE) MG tablet Take 1 tablet (325 mg total) by  mouth 2 (two) times daily with a meal.   Fluticasone-Salmeterol 500-50 MCG/DOSE Aepb Commonly known as:  ADVAIR DISKUS INHALE 1 PUFF INTO THE LUNGS TWICE A DAY   folic acid 1 MG tablet Commonly known as:  FOLVITE Take 1 tablet (1 mg total) by mouth daily.   HYDROcodone-acetaminophen 5-325 MG tablet Commonly known as:  NORCO/VICODIN Take 1 tablet by mouth every 6 (six) hours as needed. for pain   LORazepam 1 MG tablet Commonly known as:  ATIVAN Take 1 tablet (1 mg total) by mouth 2 (two) times daily as needed for anxiety (Anxiety, shakiness. DO NOT USE WITH ALCOHOL.).   magnesium oxide 400 MG tablet Commonly known as:  MAG-OX Take 400 mg by mouth daily as needed.   methocarbamol 500 MG tablet Commonly known as:  ROBAXIN Take 750 mg by mouth every 4 (four) hours as needed for muscle spasms.   metroNIDAZOLE 500 MG tablet Commonly known as:  FLAGYL Take 1 tablet (500 mg total) by mouth every 8 (eight) hours.   montelukast 10 MG tablet Commonly known as:  SINGULAIR Take 1 tablet (10 mg total) by mouth at bedtime.   multivitamin with minerals Tabs tablet Take 1 tablet by mouth daily.   pantoprazole 40 MG tablet Commonly known as:  PROTONIX Take 1 tablet (40 mg total) by mouth daily.   PARoxetine 10 MG tablet Commonly known as:  PAXIL Take 0.5 tablets (5 mg total) by mouth daily.   potassium chloride SA 20 MEQ tablet Commonly known as:  K-DUR,KLOR-CON Take 1 tablet (20 mEq total) by mouth daily.   propranolol 10 MG tablet Commonly known as:  INDERAL Take 1 tablet (10 mg total) by mouth 2 (two) times daily.   simvastatin 40 MG tablet Commonly known as:  ZOCOR Take 1 tablet (40 mg total) by mouth every morning.   sucralfate 1 g tablet Commonly known as:  CARAFATE Take 1 g by mouth 4 (four) times daily. Take 1 tablet at bedtime on an empty stomach and before meals BID   thiamine 100 MG tablet Take 1 tablet (100 mg total) by mouth daily.   vancomycin 50 mg/mL oral  solution Commonly known as:  VANCOCIN Take 2.5 mLs (125 mg total) by mouth 4 (four) times daily.   VASCEPA 1 g Caps Generic drug:  Icosapent Ethyl Take 1 g by mouth 2 (two) times daily.   zolpidem 5 MG tablet Commonly known as:  AMBIEN Take 1 tablet (5 mg total) by mouth at bedtime as needed. for sleep      Follow-up Information    [facility].   Specialty:  Home Health Services Why:  physical therapy and nurse Contact information: [address] [place] [address] [phone]          Allergies  Allergen Reactions  . Azithromycin Itching and Swelling  . Morphine And Related Itching    Consultations:  Gi  PCCM   Procedures/Studies: Dg Chest Port 1 View  Result Date: 12/07/2016 CLINICAL DATA:  Tachycardia. EXAM: PORTABLE CHEST 1 VIEW COMPARISON:  02/28/2014 and 04/13/2016 FINDINGS: Lungs are adequately inflated and  otherwise clear. Cardiomediastinal silhouette is within normal. There is minimal calcified plaque over the aortic arch. Remaining bones and soft tissues are within normal. IMPRESSION: No acute cardiopulmonary disease. Mild aortic atherosclerosis. Electronically Signed   By: Marin Olp M.D.   On: 12/07/2016 16:40    Subjective: Diarrhea improved.  Discharge Exam: Vitals:   12/13/16 2022 12/14/16 0458  BP: (!) 152/74 130/68  Pulse: 82 81  Resp: 20 19  Temp: 98.6 F (37 C) 98.5 F (36.9 C)   Vitals:   12/13/16 1502 12/13/16 2022 12/14/16 0458 12/14/16 0928  BP: 123/75 (!) 152/74 130/68   Pulse: 82 82 81   Resp: 20 20 19    Temp: 98.5 F (36.9 C) 98.6 F (37 C) 98.5 F (36.9 C)   TempSrc: Oral Oral Oral   SpO2: 100% 100% 100% 100%  Weight:      Height:        General: Pt is alert, awake, not in acute distress Cardiovascular: RRR, S1/S2 +, no rubs, no gallops Respiratory: CTA bilaterally, no wheezing, no rhonchi Abdominal: Soft, NT, ND, bowel sounds + Extremities: no edema, no cyanosis    The  results of significant diagnostics from this hospitalization (including imaging, microbiology, ancillary and laboratory) are listed below for reference.     Microbiology: Recent Results (from the past 240 hour(s))  MRSA PCR Screening     Status: None   Collection Time: 12/07/16 10:45 AM  Result Value Ref Range Status   MRSA by PCR NEGATIVE NEGATIVE Final    Comment:        The GeneXpert MRSA Assay (FDA approved for NASAL specimens only), is one component of a comprehensive MRSA colonization surveillance program. It is not intended to diagnose MRSA infection nor to guide or monitor treatment for MRSA infections.   C difficile quick scan w PCR reflex     Status: Abnormal   Collection Time: 12/09/16 12:47 PM  Result Value Ref Range Status   C Diff antigen POSITIVE (A) NEGATIVE Final   C Diff toxin NEGATIVE NEGATIVE Final   C Diff interpretation Results are indeterminate. See PCR results.  Final  Clostridium Difficile by PCR     Status: Abnormal   Collection Time: 12/09/16 12:47 PM  Result Value Ref Range Status   Toxigenic C Difficile by pcr POSITIVE (A) NEGATIVE Final    Comment: Positive for toxigenic C. difficile with little to no toxin production. Only treat if clinical presentation suggests symptomatic illness. Performed at [facility], [place] [address] [place], [place] [address]      Labs: BNP (last 3 results) No results for input(s): BNP in the last 8760 hours. Basic Metabolic Panel:  Recent Labs Lab 12/08/16 0123 12/08/16 0126 12/09/16 1245 12/10/16 0111 12/11/16 0346  NA 139  --  141 140 140  K 3.3*  --  4.9 4.4 4.2  CL 109  --  112* 110 111  CO2 23  --  20* 22 23  GLUCOSE 92  --  108* 120* 100*  BUN 29*  --  7 5* <5*  CREATININE 0.65  --  0.48 0.57 0.50  CALCIUM 7.7*  --  8.3* 8.3* 8.3*  MG  --  1.3* 1.3* 1.3*  --    Liver Function Tests:  Recent Labs Lab 12/10/16 0111  AST 62*  ALT 26  ALKPHOS 396*  BILITOT 0.7  PROT 6.3*  ALBUMIN 2.8*    No results for input(s): LIPASE, AMYLASE in the last 168  hours. No results for input(s): AMMONIA in the last 168 hours. CBC:  Recent Labs Lab 12/08/16 0123 12/08/16 0654 12/09/16 1245 12/10/16 0111 12/11/16 0346  WBC 12.0* 9.7 11.7* 13.5* 8.7  HGB 10.4* 10.6* 9.7* 8.9* 9.2*  HCT 29.8* 33.7* 29.4* 27.3* 28.3*  MCV 86.6 86.4 90.5 90.4 92.5  PLT 212 141* 206 228 206   Cardiac Enzymes: No results for input(s): CKTOTAL, CKMB, CKMBINDEX, TROPONINI in the last 168 hours. BNP: Invalid input(s): POCBNP CBG: No results for input(s): GLUCAP in the last 168 hours. D-Dimer No results for input(s): DDIMER in the last 72 hours. Hgb A1c No results for input(s): HGBA1C in the last 72 hours. Lipid Profile No results for input(s): CHOL, HDL, LDLCALC, TRIG, CHOLHDL, LDLDIRECT in the last 72 hours. Thyroid function studies No results for input(s): TSH, T4TOTAL, T3FREE, THYROIDAB in the last 72 hours.  Invalid input(s): FREET3 Anemia work up No results for input(s): VITAMINB12, FOLATE, FERRITIN, TIBC, IRON, RETICCTPCT in the last 72 hours. Urinalysis    Component Value Date/Time   COLORURINE YELLOW 10/25/2016 1649   APPEARANCEUR HAZY (A) 10/25/2016 1649   LABSPEC 1.013 10/25/2016 1649   PHURINE 6.0 10/25/2016 1649   GLUCOSEU NEGATIVE 10/25/2016 1649   HGBUR NEGATIVE 10/25/2016 1649   HGBUR negative 07/07/2007 0838   BILIRUBINUR NEGATIVE 10/25/2016 1649   KETONESUR NEGATIVE 10/25/2016 1649   PROTEINUR NEGATIVE 10/25/2016 1649   UROBILINOGEN 1.0 04/22/2015 0020   NITRITE NEGATIVE 10/25/2016 1649   LEUKOCYTESUR NEGATIVE 10/25/2016 1649   Sepsis Labs Invalid input(s): PROCALCITONIN,  WBC,  LACTICIDVEN Microbiology Recent Results (from the past 240 hour(s))  MRSA PCR Screening     Status: None   Collection Time: 12/07/16 10:45 AM  Result Value Ref Range Status   MRSA by PCR NEGATIVE NEGATIVE Final    Comment:        The GeneXpert MRSA Assay (FDA approved for NASAL  specimens only), is one component of a comprehensive MRSA colonization surveillance program. It is not intended to diagnose MRSA infection nor to guide or monitor treatment for MRSA infections.   C difficile quick scan w PCR reflex     Status: Abnormal   Collection Time: 12/09/16 12:47 PM  Result Value Ref Range Status   C Diff antigen POSITIVE (A) NEGATIVE Final   C Diff toxin NEGATIVE NEGATIVE Final   C Diff interpretation Results are indeterminate. See PCR results.  Final  Clostridium Difficile by PCR     Status: Abnormal   Collection Time: 12/09/16 12:47 PM  Result Value Ref Range Status   Toxigenic C Difficile by pcr POSITIVE (A) NEGATIVE Final    Comment: Positive for toxigenic C. difficile with little to no toxin production. Only treat if clinical presentation suggests symptomatic illness. Performed at [facility], [place] [address] [place], [place] [address]      Time coordinating discharge: Over 30 minutes  SIGNED:   Charlynne Cousins, MD  [facility] 12/14/2016, 9:45 AM Pager   If 7PM-7AM, please contact night-coverage [email or web address] Password [email or web address]

## 2016-12-14 NOTE — Progress Notes (Signed)
Patient discharged home.  Leaving with personal belongings, prescriptions, and Home Health.  Reports understanding of discharge instructions.  Room air, denies pain, no s/s of distress.  Accompanied by husband.  No complaints.

## 2016-12-26 ENCOUNTER — Other Ambulatory Visit: Payer: Self-pay | Admitting: Family

## 2017-01-07 ENCOUNTER — Ambulatory Visit: Payer: Medicare HMO | Admitting: Gastroenterology

## 2017-01-10 ENCOUNTER — Other Ambulatory Visit: Payer: Self-pay | Admitting: Family

## 2017-01-10 DIAGNOSIS — G4700 Insomnia, unspecified: Secondary | ICD-10-CM

## 2017-01-17 ENCOUNTER — Other Ambulatory Visit: Payer: Self-pay | Admitting: Family

## 2017-01-17 DIAGNOSIS — G4700 Insomnia, unspecified: Secondary | ICD-10-CM

## 2017-01-23 ENCOUNTER — Other Ambulatory Visit: Payer: Self-pay | Admitting: Gastroenterology

## 2017-01-23 DIAGNOSIS — K7469 Other cirrhosis of liver: Secondary | ICD-10-CM

## 2017-01-28 ENCOUNTER — Other Ambulatory Visit: Payer: Self-pay | Admitting: Family

## 2017-01-30 ENCOUNTER — Other Ambulatory Visit: Payer: Medicare HMO

## 2017-02-06 ENCOUNTER — Other Ambulatory Visit: Payer: Medicare HMO

## 2017-02-10 ENCOUNTER — Other Ambulatory Visit: Payer: Self-pay | Admitting: Family

## 2017-02-10 ENCOUNTER — Ambulatory Visit
Admission: RE | Admit: 2017-02-10 | Discharge: 2017-02-10 | Disposition: A | Discharge status: Home / Self Care | Payer: Medicare HMO | Source: Ambulatory Visit | Attending: Gastroenterology | Admitting: Gastroenterology

## 2017-02-10 DIAGNOSIS — K7460 Unspecified cirrhosis of liver: Secondary | ICD-10-CM | POA: Diagnosis not present

## 2017-02-10 DIAGNOSIS — K7469 Other cirrhosis of liver: Secondary | ICD-10-CM

## 2017-03-03 DIAGNOSIS — F1010 Alcohol abuse, uncomplicated: Secondary | ICD-10-CM | POA: Diagnosis not present

## 2017-03-03 DIAGNOSIS — M545 Low back pain: Secondary | ICD-10-CM | POA: Diagnosis not present

## 2017-03-03 DIAGNOSIS — G4700 Insomnia, unspecified: Secondary | ICD-10-CM | POA: Diagnosis not present

## 2017-03-03 DIAGNOSIS — R296 Repeated falls: Secondary | ICD-10-CM | POA: Diagnosis not present

## 2017-03-10 DIAGNOSIS — Z0000 Encounter for general adult medical examination without abnormal findings: Secondary | ICD-10-CM | POA: Diagnosis not present

## 2017-03-10 DIAGNOSIS — M545 Low back pain: Secondary | ICD-10-CM | POA: Diagnosis not present

## 2017-03-10 DIAGNOSIS — Z1211 Encounter for screening for malignant neoplasm of colon: Secondary | ICD-10-CM | POA: Diagnosis not present

## 2017-03-10 DIAGNOSIS — D508 Other iron deficiency anemias: Secondary | ICD-10-CM | POA: Diagnosis not present

## 2017-03-10 DIAGNOSIS — G8929 Other chronic pain: Secondary | ICD-10-CM | POA: Diagnosis not present

## 2017-03-10 DIAGNOSIS — Z1389 Encounter for screening for other disorder: Secondary | ICD-10-CM | POA: Diagnosis not present

## 2017-03-10 DIAGNOSIS — R030 Elevated blood-pressure reading, without diagnosis of hypertension: Secondary | ICD-10-CM | POA: Diagnosis not present

## 2017-03-10 DIAGNOSIS — Z1231 Encounter for screening mammogram for malignant neoplasm of breast: Secondary | ICD-10-CM | POA: Diagnosis not present

## 2017-03-12 ENCOUNTER — Other Ambulatory Visit: Payer: Self-pay | Admitting: Family Medicine

## 2017-03-12 DIAGNOSIS — Z1231 Encounter for screening mammogram for malignant neoplasm of breast: Secondary | ICD-10-CM

## 2017-03-13 ENCOUNTER — Other Ambulatory Visit: Payer: Self-pay | Admitting: Family

## 2017-03-18 DIAGNOSIS — M6281 Muscle weakness (generalized): Secondary | ICD-10-CM | POA: Diagnosis not present

## 2017-03-18 DIAGNOSIS — R269 Unspecified abnormalities of gait and mobility: Secondary | ICD-10-CM | POA: Diagnosis not present

## 2017-03-18 DIAGNOSIS — Z5181 Encounter for therapeutic drug level monitoring: Secondary | ICD-10-CM | POA: Diagnosis not present

## 2017-03-18 DIAGNOSIS — Z4789 Encounter for other orthopedic aftercare: Secondary | ICD-10-CM | POA: Diagnosis not present

## 2017-03-18 DIAGNOSIS — S72012A Unspecified intracapsular fracture of left femur, initial encounter for closed fracture: Secondary | ICD-10-CM | POA: Diagnosis not present

## 2017-03-18 DIAGNOSIS — J4550 Severe persistent asthma, uncomplicated: Secondary | ICD-10-CM | POA: Diagnosis not present

## 2017-03-18 DIAGNOSIS — R278 Other lack of coordination: Secondary | ICD-10-CM | POA: Diagnosis not present

## 2017-03-24 DIAGNOSIS — G894 Chronic pain syndrome: Secondary | ICD-10-CM | POA: Diagnosis not present

## 2017-03-24 DIAGNOSIS — M79605 Pain in left leg: Secondary | ICD-10-CM | POA: Diagnosis not present

## 2017-03-24 DIAGNOSIS — M545 Low back pain: Secondary | ICD-10-CM | POA: Diagnosis not present

## 2017-03-24 DIAGNOSIS — M25562 Pain in left knee: Secondary | ICD-10-CM | POA: Diagnosis not present

## 2017-03-26 ENCOUNTER — Encounter: Payer: Self-pay | Admitting: Gynecology

## 2017-03-29 IMAGING — CR DG CHEST 1V
1 series · 1 of 1 positions shown · non-contrast
Comparison: Chest radiograph performed 02/28/2014

CLINICAL DATA: Status post fall. Preoperative chest radiograph.
Initial encounter.

EXAM:
CHEST 1 VIEW

[w chest decub]
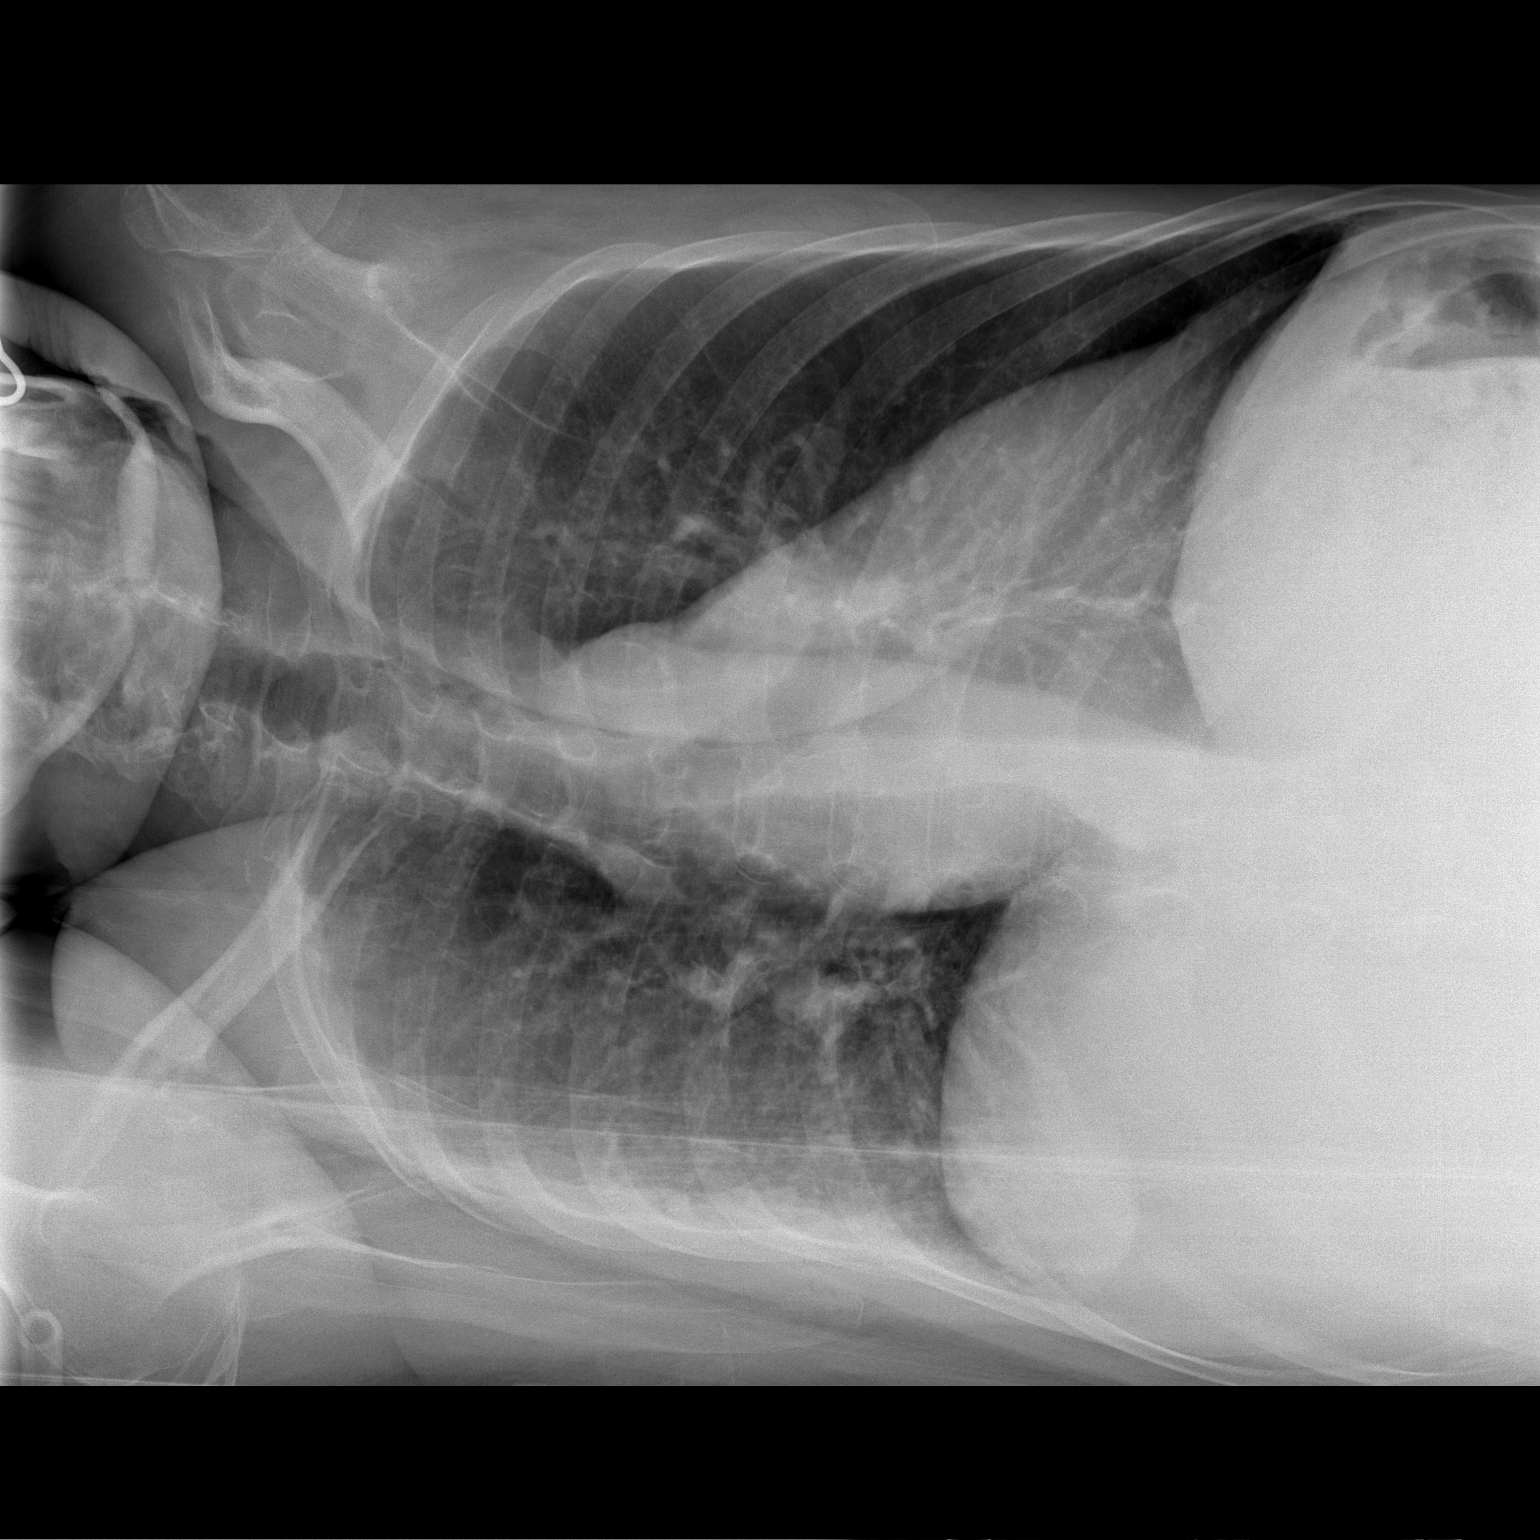

[1 of 1 positions shown; findings below may reference images not displayed]

FINDINGS: The patient was imaged [REDACTED]ubitus position due to limitations in
positioning.

Pulmonary vascularity is at the upper limits of normal. Minimal
scarring is noted at the left lung base. Trace right-sided pleural
fluid is suggested. No pneumothorax identified.

The cardiomediastinal silhouette is normal in size. No acute osseous
abnormalities are identified.
IMPRESSION: Suggestion of trace right-sided pleural fluid. Minimal left basilar
scarring. Lungs otherwise clear.

## 2017-03-31 ENCOUNTER — Ambulatory Visit: Payer: Medicare HMO

## 2017-03-31 IMAGING — RF DG FEMUR 2+V*L*
1 series · 3 of 3 positions shown · non-contrast
Comparison: None.

CLINICAL DATA: Distal left femoral fracture with fixation

EXAM:
LEFT FEMUR 2 VIEWS

[Series 1: run · 3 of 3 slices shown]
[im 1/3]
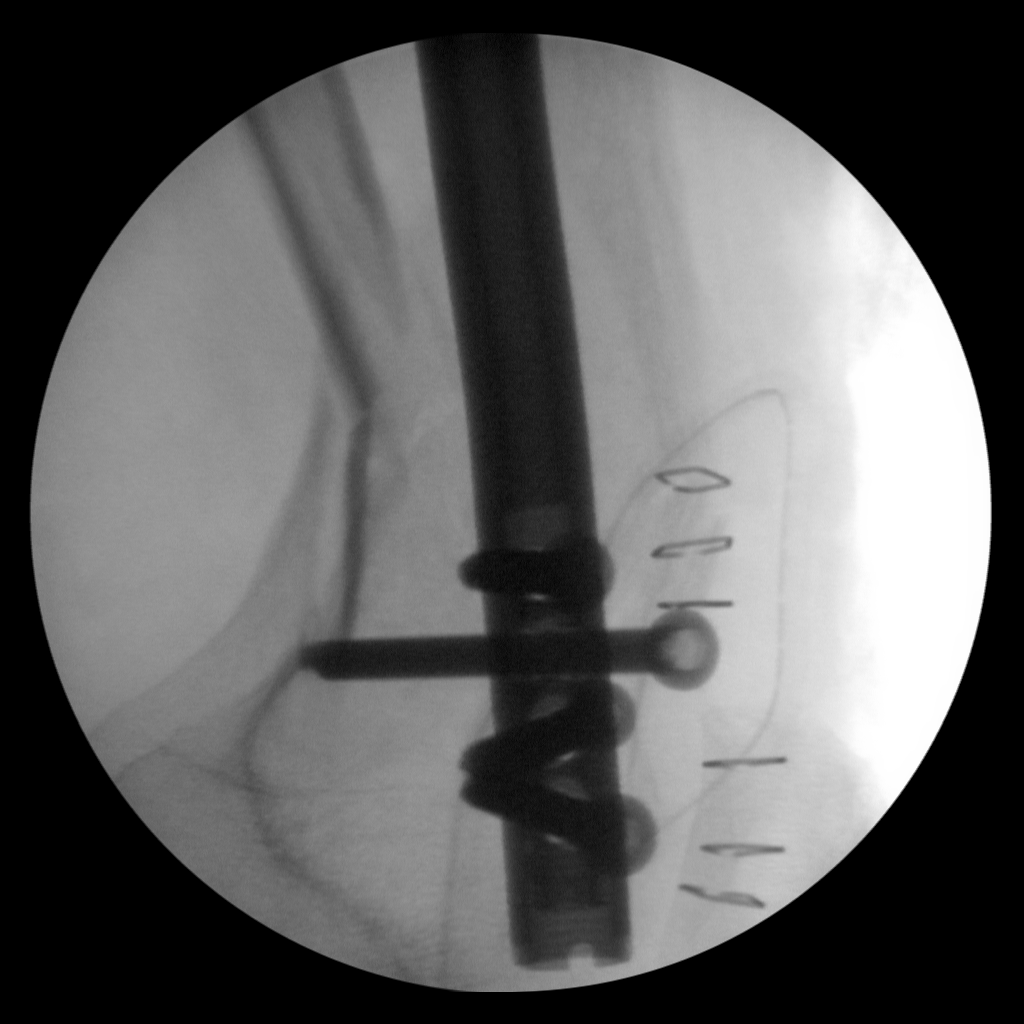
[im 2/3]
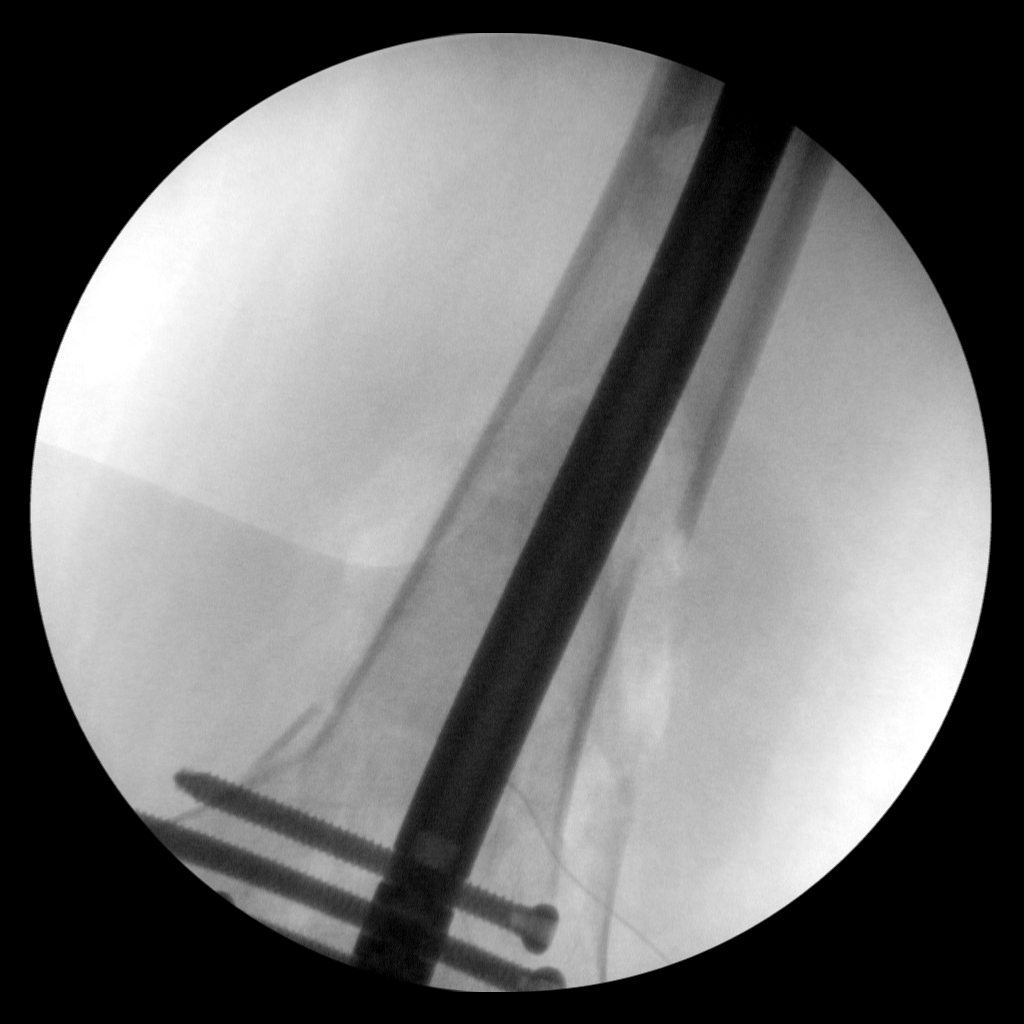
[im 3/3]
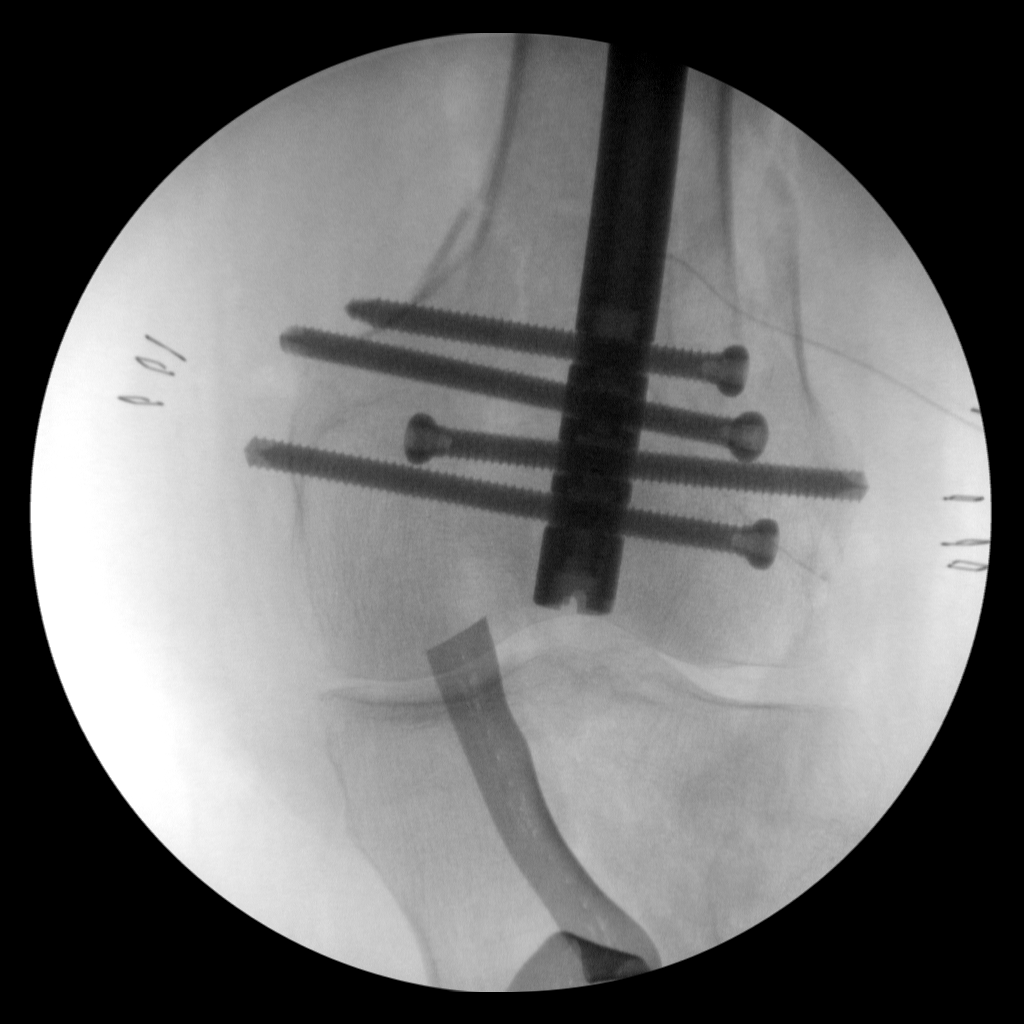

[3 of 3 positions shown; findings below may reference images not displayed]

FLUOROSCOPY TIME:  Radiation Exposure Index (as provided by the
fluoroscopic device): 14.42 mGy

If the device does not provide the exposure index:

Fluoroscopy Time:  1 minutes 22 seconds

Number of Acquired Images:  3
FINDINGS: A medullary rod is noted with multiple fixation screws distally with
reduction of the previously seen distal femoral fracture.
IMPRESSION: Status post ORIF of left femoral fracture.

## 2017-04-02 DIAGNOSIS — R296 Repeated falls: Secondary | ICD-10-CM | POA: Diagnosis not present

## 2017-04-02 DIAGNOSIS — M1990 Unspecified osteoarthritis, unspecified site: Secondary | ICD-10-CM | POA: Diagnosis not present

## 2017-04-02 DIAGNOSIS — G894 Chronic pain syndrome: Secondary | ICD-10-CM | POA: Diagnosis not present

## 2017-04-02 DIAGNOSIS — G4700 Insomnia, unspecified: Secondary | ICD-10-CM | POA: Diagnosis not present

## 2017-04-03 ENCOUNTER — Ambulatory Visit (INDEPENDENT_AMBULATORY_CARE_PROVIDER_SITE_OTHER): Payer: Medicare HMO

## 2017-04-03 ENCOUNTER — Ambulatory Visit (INDEPENDENT_AMBULATORY_CARE_PROVIDER_SITE_OTHER): Payer: Medicare HMO | Admitting: Podiatry

## 2017-04-03 ENCOUNTER — Encounter: Payer: Self-pay | Admitting: Podiatry

## 2017-04-03 DIAGNOSIS — M19172 Post-traumatic osteoarthritis, left ankle and foot: Secondary | ICD-10-CM | POA: Diagnosis not present

## 2017-04-03 DIAGNOSIS — Z8781 Personal history of (healed) traumatic fracture: Secondary | ICD-10-CM

## 2017-04-03 DIAGNOSIS — M2170 Unequal limb length (acquired), unspecified site: Secondary | ICD-10-CM

## 2017-04-03 NOTE — Progress Notes (Signed)
   Subjective:    Patient ID: [name], female    DOB: Dec 17, 1945, 71 y.o.   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 She has a history of an ankle fracture as well as a left femoral fracture which left her with a limb length discrepancy. Because of this she states that she walks on balance and this causes overall generalized foot pain assessment ongoing for about 2 years. She has not had any treatment for limb length discrepancy. She denies any recent injury or trauma to the lower extremities. She does occasional swelling to both of her feet but again this is been ongoing for quite some time this is not new. Denies any redness or warmth.    Review of Systems  All other systems reviewed and are negative.      Objective:   Physical Exam General: AAO x3, NAD  Dermatological: Skin is warm, dry and supple bilateral. Nails x 10 are well manicured; remaining integument appears unremarkable at this time. There are no open sores, no preulcerative lesions, no rash or signs of infection present.  Vascular: Dorsalis Pedis artery and Posterior Tibial artery pedal pulses are 2/4 bilateral with immedate capillary fill time.  There is no pain with calf compression, swelling, warmth, erythema.   Neruologic: Grossly intact via light touch bilateral. Vibratory intact via tuning fork bilateral. Protective threshold with Semmes Wienstein monofilament intact to all pedal sites bilateral.   Musculoskeletal:There is no area pinpoint bony tenderness or pain the vibratory sensation bilateral lower extremity is. There is a limit discrepancy with the left side shorter than the right. Left-sided measures 92 and the he right is 97 measured from the umbilicus to the medial malleolus. Muscular strength 5/5 in all groups tested bilateral.  Gait: Walks with limp due to limb length discrepancy    Assessment &  Plan:  71 year old female from the discrepancy -Treatment options discussed including all alternatives, risks, and complications -Etiology of symptoms were discussed -X-rays were obtained and reviewed with the patient. No evidence of acute fracture identified. -Given her limited discrepancy I do believe that a custom rocker bottom shoe would beneficial for her. We'll start with half-inch on the left. We'll contact insurance of prior authorization. Discussed with Caryl Pina, DPM

## 2017-04-15 ENCOUNTER — Ambulatory Visit: Payer: Medicare HMO

## 2017-04-18 ENCOUNTER — Telehealth: Payer: Self-pay | Admitting: *Deleted

## 2017-04-18 DIAGNOSIS — Z4789 Encounter for other orthopedic aftercare: Secondary | ICD-10-CM | POA: Diagnosis not present

## 2017-04-18 DIAGNOSIS — S72012A Unspecified intracapsular fracture of left femur, initial encounter for closed fracture: Secondary | ICD-10-CM | POA: Diagnosis not present

## 2017-04-18 DIAGNOSIS — M6281 Muscle weakness (generalized): Secondary | ICD-10-CM | POA: Diagnosis not present

## 2017-04-18 DIAGNOSIS — Z5181 Encounter for therapeutic drug level monitoring: Secondary | ICD-10-CM | POA: Diagnosis not present

## 2017-04-18 DIAGNOSIS — J4550 Severe persistent asthma, uncomplicated: Secondary | ICD-10-CM | POA: Diagnosis not present

## 2017-04-18 DIAGNOSIS — R269 Unspecified abnormalities of gait and mobility: Secondary | ICD-10-CM | POA: Diagnosis not present

## 2017-04-18 DIAGNOSIS — R278 Other lack of coordination: Secondary | ICD-10-CM | POA: Diagnosis not present

## 2017-04-18 NOTE — Telephone Encounter (Signed)
Pt asked status of the diabetic shoe order.

## 2017-04-19 IMAGING — US US ABDOMEN COMPLETE
1 series · 13 of 25 positions shown · non-contrast
Comparison: 04/06/2016 CT.

CLINICAL DATA: 70-year-old female with cirrhosis. Screening. Post
cholecystectomy.

EXAM:
ABDOMEN ULTRASOUND COMPLETE

[Series 1: us abdomen complete · 0.20mm/px · 13 of 80 slices shown]
[im 1/80]
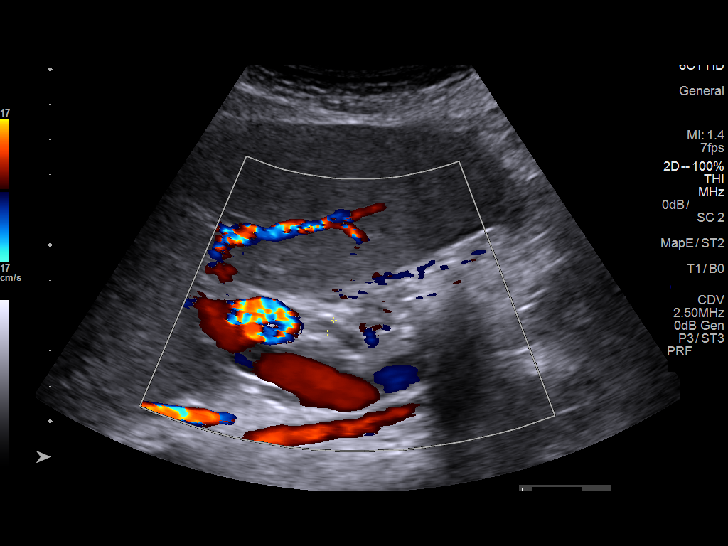
[im 7/80]
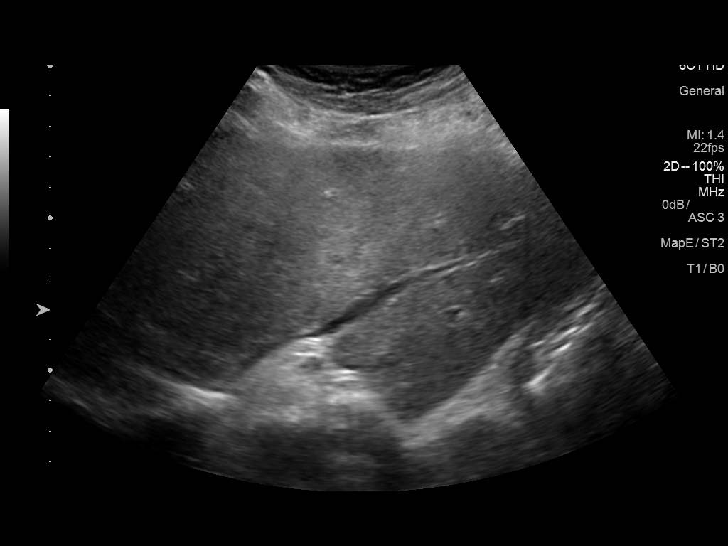
[im 14/80]
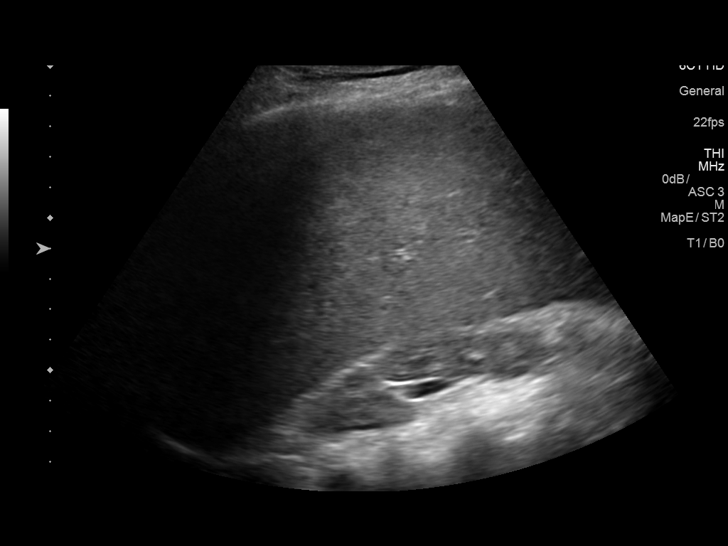
[im 20/80]
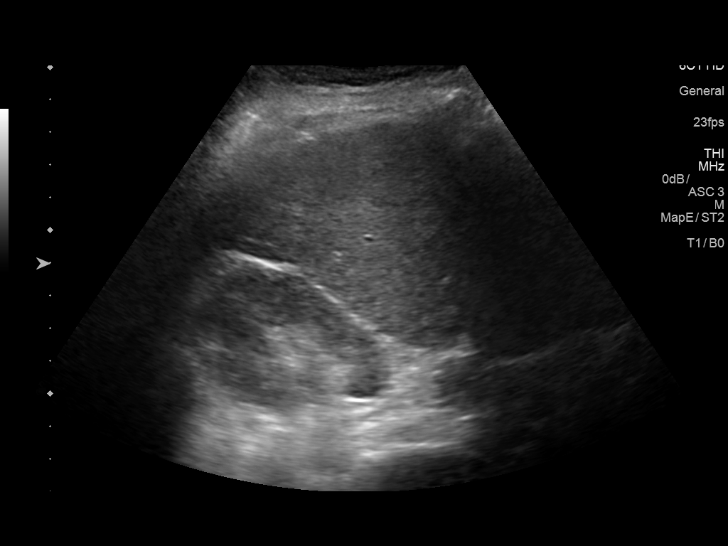
[im 27/80]
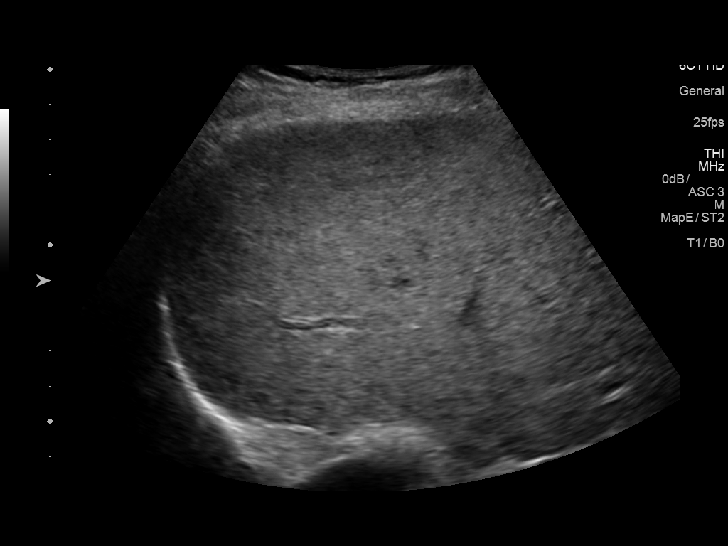
[im 33/80]
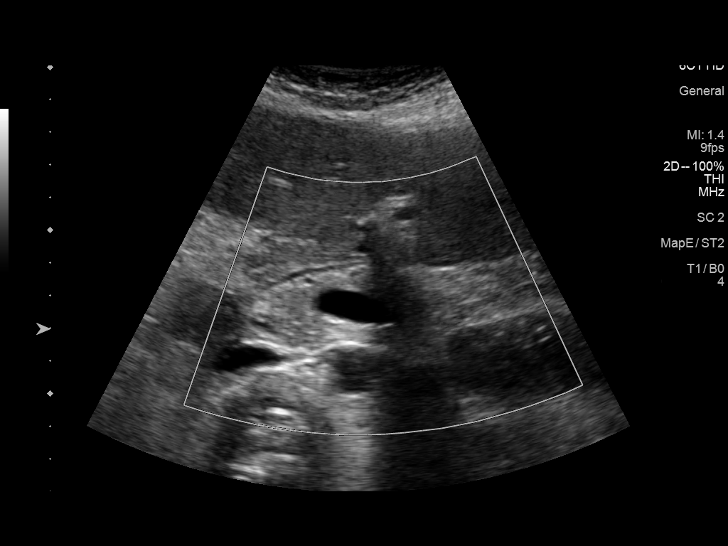
[im 40/80]
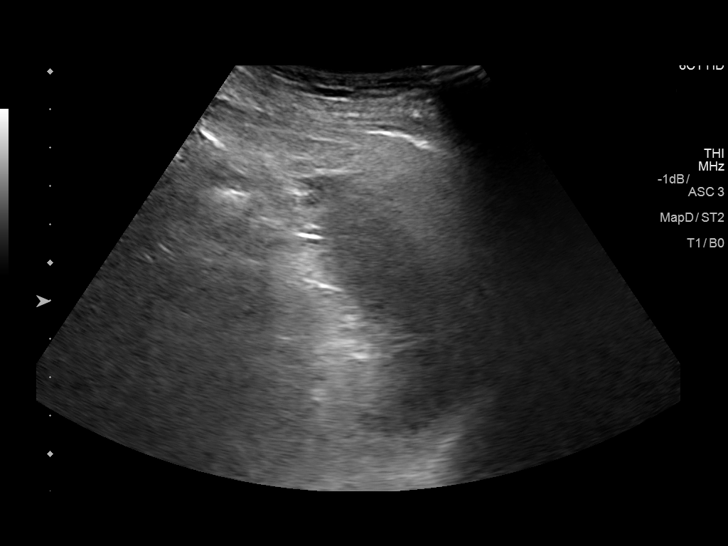
[im 47/80]
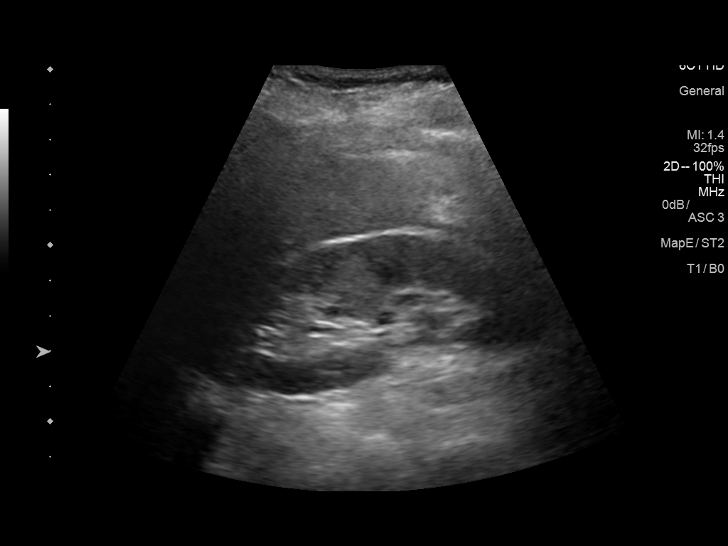
[im 53/80]
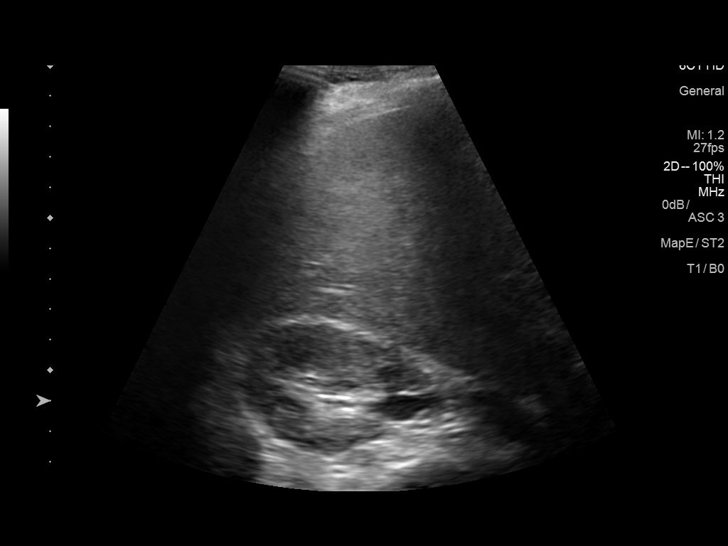
[im 60/80]
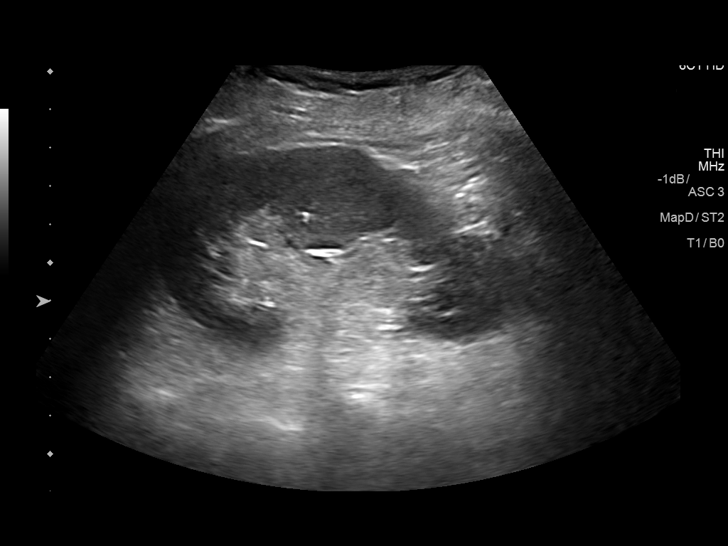
[im 66/80]
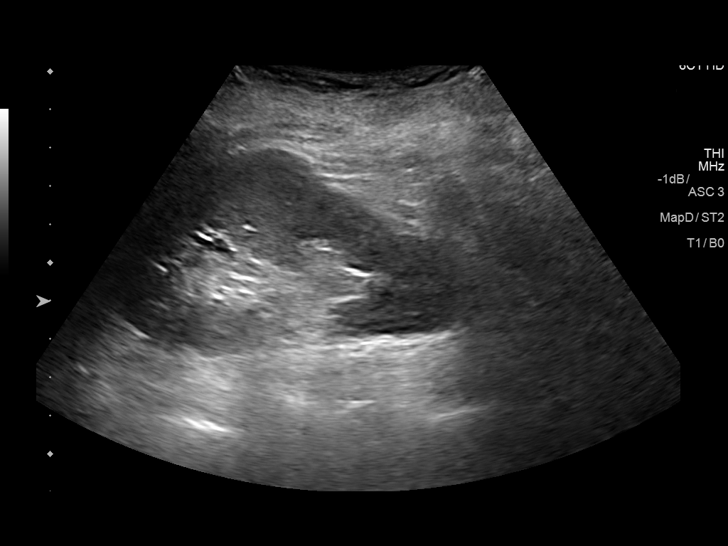
[im 73/80]
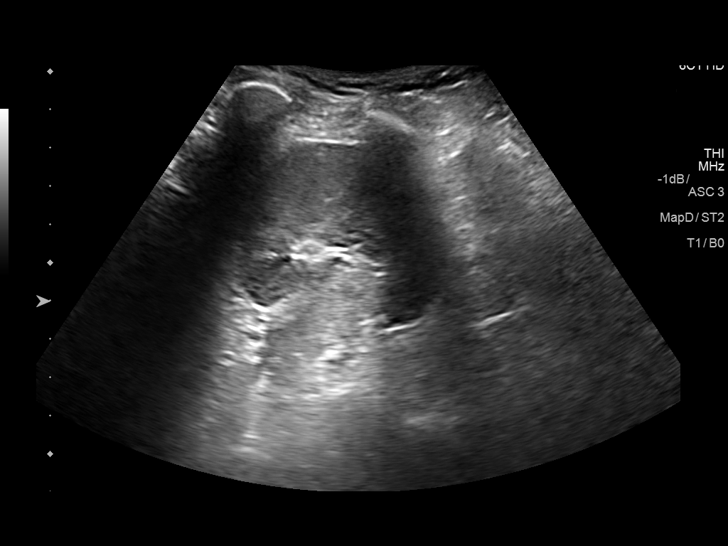
[im 80/80]
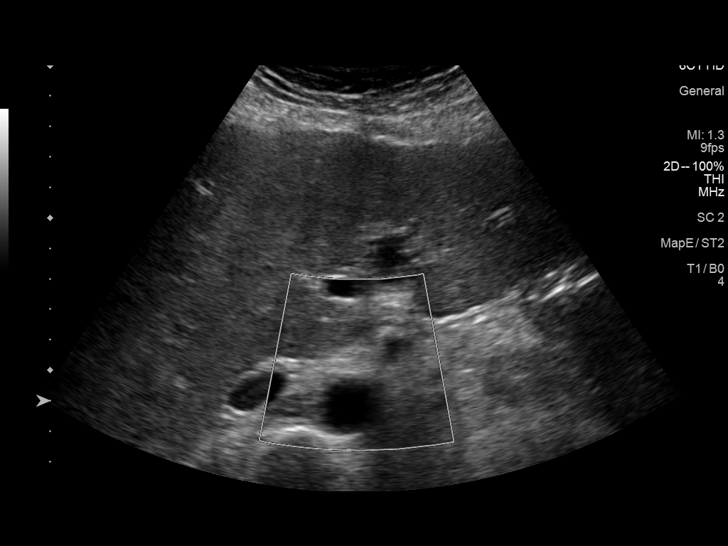

[13 of 25 positions shown; findings below may reference images not displayed]

FINDINGS: Gallbladder: Post cholecystectomy.

Common bile duct: Diameter: 4.1 mm.

Liver: Enlarged slightly lobulated contour without focal mass.
Hepatic veins slightly attenuated. Increased echogenicity suggestive
of fatty infiltration.

IVC: No abnormality visualized.

Pancreas: Poorly delineated secondary to bowel gas.

Spleen: Size and appearance within normal limits.

Right Kidney: Length: 10.6 cm. Echogenicity within normal limits. No
mass or hydronephrosis visualized.

Left Kidney: Length: 10.1 cm. Evaluation limited by bowel gas. No
hydronephrosis.

Abdominal aorta: Bifurcation not visualized secondary to bowel gas.
Remainder of aorta without aneurysmal dilation.

Other findings: None.
IMPRESSION: Enlarged fatty liver with slightly lobulated contour without focal
hepatic lesion. Hepatic veins are slightly attenuated in caliber.

Post cholecystectomy.

Secondary to bowel gas and habitus, poor delineation of portions of
the pancreas, left kidney and aortic bifurcation. Taking this
limitation into account, no hydronephrosis or aortic aneurysm is
noted.

## 2017-04-22 DIAGNOSIS — Z23 Encounter for immunization: Secondary | ICD-10-CM | POA: Diagnosis not present

## 2017-04-22 DIAGNOSIS — Z86010 Personal history of colonic polyps: Secondary | ICD-10-CM | POA: Diagnosis not present

## 2017-04-22 DIAGNOSIS — I8500 Esophageal varices without bleeding: Secondary | ICD-10-CM | POA: Diagnosis not present

## 2017-04-22 DIAGNOSIS — K7460 Unspecified cirrhosis of liver: Secondary | ICD-10-CM | POA: Diagnosis not present

## 2017-04-23 DIAGNOSIS — M545 Low back pain: Secondary | ICD-10-CM | POA: Diagnosis not present

## 2017-04-23 DIAGNOSIS — G894 Chronic pain syndrome: Secondary | ICD-10-CM | POA: Diagnosis not present

## 2017-04-23 DIAGNOSIS — M79605 Pain in left leg: Secondary | ICD-10-CM | POA: Diagnosis not present

## 2017-04-24 ENCOUNTER — Ambulatory Visit (INDEPENDENT_AMBULATORY_CARE_PROVIDER_SITE_OTHER): Payer: Medicare HMO | Admitting: Orthotics

## 2017-04-24 DIAGNOSIS — M2170 Unequal limb length (acquired), unspecified site: Secondary | ICD-10-CM

## 2017-04-29 ENCOUNTER — Ambulatory Visit: Payer: Medicare HMO

## 2017-04-29 NOTE — Progress Notes (Signed)
l °

## 2017-05-07 DIAGNOSIS — I8500 Esophageal varices without bleeding: Secondary | ICD-10-CM | POA: Diagnosis not present

## 2017-05-07 DIAGNOSIS — D509 Iron deficiency anemia, unspecified: Secondary | ICD-10-CM | POA: Diagnosis not present

## 2017-05-07 DIAGNOSIS — K766 Portal hypertension: Secondary | ICD-10-CM | POA: Diagnosis not present

## 2017-05-07 DIAGNOSIS — Z86010 Personal history of colonic polyps: Secondary | ICD-10-CM | POA: Diagnosis not present

## 2017-05-18 DIAGNOSIS — Z4789 Encounter for other orthopedic aftercare: Secondary | ICD-10-CM | POA: Diagnosis not present

## 2017-05-18 DIAGNOSIS — R278 Other lack of coordination: Secondary | ICD-10-CM | POA: Diagnosis not present

## 2017-05-18 DIAGNOSIS — Z5181 Encounter for therapeutic drug level monitoring: Secondary | ICD-10-CM | POA: Diagnosis not present

## 2017-05-18 DIAGNOSIS — J4550 Severe persistent asthma, uncomplicated: Secondary | ICD-10-CM | POA: Diagnosis not present

## 2017-05-18 DIAGNOSIS — S72012A Unspecified intracapsular fracture of left femur, initial encounter for closed fracture: Secondary | ICD-10-CM | POA: Diagnosis not present

## 2017-05-18 DIAGNOSIS — R269 Unspecified abnormalities of gait and mobility: Secondary | ICD-10-CM | POA: Diagnosis not present

## 2017-05-18 DIAGNOSIS — M6281 Muscle weakness (generalized): Secondary | ICD-10-CM | POA: Diagnosis not present

## 2017-05-23 DIAGNOSIS — M545 Low back pain: Secondary | ICD-10-CM | POA: Diagnosis not present

## 2017-05-23 DIAGNOSIS — M79605 Pain in left leg: Secondary | ICD-10-CM | POA: Diagnosis not present

## 2017-05-23 DIAGNOSIS — Z79891 Long term (current) use of opiate analgesic: Secondary | ICD-10-CM | POA: Diagnosis not present

## 2017-05-23 DIAGNOSIS — G894 Chronic pain syndrome: Secondary | ICD-10-CM | POA: Diagnosis not present

## 2017-05-26 ENCOUNTER — Ambulatory Visit (INDEPENDENT_AMBULATORY_CARE_PROVIDER_SITE_OTHER): Payer: Medicare HMO | Admitting: Podiatry

## 2017-05-26 DIAGNOSIS — M2170 Unequal limb length (acquired), unspecified site: Secondary | ICD-10-CM

## 2017-05-26 NOTE — Progress Notes (Signed)
Patient ID: [name], female   DOB: 1946/10/22, 71 y.o.   MRN: [id number]   Patient presents for diabetic shoe pick up.  Unfortunately patient was measured and choose a shoe but no molds were made.  Patient was molded with apologies for shoes not being available.  We will send out all information today and hopefully all will be returned to the office in 3-4 weeks.  We will call when both shoes and inserts are in the office. Patient is found to not be diabetic and was supposed to be discussing a brace vs orthotics.  Referring back to [facility].

## 2017-05-28 DIAGNOSIS — G4700 Insomnia, unspecified: Secondary | ICD-10-CM | POA: Diagnosis not present

## 2017-05-28 DIAGNOSIS — J45909 Unspecified asthma, uncomplicated: Secondary | ICD-10-CM | POA: Diagnosis not present

## 2017-05-28 DIAGNOSIS — R296 Repeated falls: Secondary | ICD-10-CM | POA: Diagnosis not present

## 2017-05-28 DIAGNOSIS — M5416 Radiculopathy, lumbar region: Secondary | ICD-10-CM | POA: Diagnosis not present

## 2017-05-28 DIAGNOSIS — L409 Psoriasis, unspecified: Secondary | ICD-10-CM | POA: Diagnosis not present

## 2017-05-29 DIAGNOSIS — Z23 Encounter for immunization: Secondary | ICD-10-CM | POA: Diagnosis not present

## 2017-05-29 DIAGNOSIS — K7460 Unspecified cirrhosis of liver: Secondary | ICD-10-CM | POA: Diagnosis not present

## 2017-06-01 ENCOUNTER — Telehealth: Payer: Self-pay | Admitting: Orthotics

## 2017-06-13 DIAGNOSIS — M79605 Pain in left leg: Secondary | ICD-10-CM | POA: Diagnosis not present

## 2017-06-13 DIAGNOSIS — D508 Other iron deficiency anemias: Secondary | ICD-10-CM | POA: Diagnosis not present

## 2017-06-13 DIAGNOSIS — M5136 Other intervertebral disc degeneration, lumbar region: Secondary | ICD-10-CM | POA: Diagnosis not present

## 2017-06-13 DIAGNOSIS — G4709 Other insomnia: Secondary | ICD-10-CM | POA: Diagnosis not present

## 2017-06-13 DIAGNOSIS — F419 Anxiety disorder, unspecified: Secondary | ICD-10-CM | POA: Diagnosis not present

## 2017-06-13 DIAGNOSIS — Z720 Tobacco use: Secondary | ICD-10-CM | POA: Diagnosis not present

## 2017-06-13 DIAGNOSIS — J4540 Moderate persistent asthma, uncomplicated: Secondary | ICD-10-CM | POA: Diagnosis not present

## 2017-06-13 DIAGNOSIS — E785 Hyperlipidemia, unspecified: Secondary | ICD-10-CM | POA: Diagnosis not present

## 2017-06-18 DIAGNOSIS — Z4789 Encounter for other orthopedic aftercare: Secondary | ICD-10-CM | POA: Diagnosis not present

## 2017-06-18 DIAGNOSIS — J4550 Severe persistent asthma, uncomplicated: Secondary | ICD-10-CM | POA: Diagnosis not present

## 2017-06-18 DIAGNOSIS — S72012A Unspecified intracapsular fracture of left femur, initial encounter for closed fracture: Secondary | ICD-10-CM | POA: Diagnosis not present

## 2017-06-18 DIAGNOSIS — Z5181 Encounter for therapeutic drug level monitoring: Secondary | ICD-10-CM | POA: Diagnosis not present

## 2017-06-18 DIAGNOSIS — R278 Other lack of coordination: Secondary | ICD-10-CM | POA: Diagnosis not present

## 2017-06-18 DIAGNOSIS — M6281 Muscle weakness (generalized): Secondary | ICD-10-CM | POA: Diagnosis not present

## 2017-06-18 DIAGNOSIS — R269 Unspecified abnormalities of gait and mobility: Secondary | ICD-10-CM | POA: Diagnosis not present

## 2017-06-19 DIAGNOSIS — M79605 Pain in left leg: Secondary | ICD-10-CM | POA: Diagnosis not present

## 2017-06-19 DIAGNOSIS — G894 Chronic pain syndrome: Secondary | ICD-10-CM | POA: Diagnosis not present

## 2017-06-19 DIAGNOSIS — Z79891 Long term (current) use of opiate analgesic: Secondary | ICD-10-CM | POA: Diagnosis not present

## 2017-06-19 DIAGNOSIS — M545 Low back pain: Secondary | ICD-10-CM | POA: Diagnosis not present

## 2017-06-26 ENCOUNTER — Other Ambulatory Visit: Payer: Medicare HMO | Admitting: Orthotics

## 2017-07-03 DIAGNOSIS — K5900 Constipation, unspecified: Secondary | ICD-10-CM | POA: Diagnosis not present

## 2017-07-03 DIAGNOSIS — L409 Psoriasis, unspecified: Secondary | ICD-10-CM | POA: Diagnosis not present

## 2017-07-03 DIAGNOSIS — G894 Chronic pain syndrome: Secondary | ICD-10-CM | POA: Diagnosis not present

## 2017-07-03 DIAGNOSIS — G4700 Insomnia, unspecified: Secondary | ICD-10-CM | POA: Diagnosis not present

## 2017-07-19 DIAGNOSIS — S72012A Unspecified intracapsular fracture of left femur, initial encounter for closed fracture: Secondary | ICD-10-CM | POA: Diagnosis not present

## 2017-07-19 DIAGNOSIS — J4550 Severe persistent asthma, uncomplicated: Secondary | ICD-10-CM | POA: Diagnosis not present

## 2017-07-19 DIAGNOSIS — Z4789 Encounter for other orthopedic aftercare: Secondary | ICD-10-CM | POA: Diagnosis not present

## 2017-07-19 DIAGNOSIS — M6281 Muscle weakness (generalized): Secondary | ICD-10-CM | POA: Diagnosis not present

## 2017-07-19 DIAGNOSIS — R278 Other lack of coordination: Secondary | ICD-10-CM | POA: Diagnosis not present

## 2017-07-19 DIAGNOSIS — Z5181 Encounter for therapeutic drug level monitoring: Secondary | ICD-10-CM | POA: Diagnosis not present

## 2017-07-19 DIAGNOSIS — R269 Unspecified abnormalities of gait and mobility: Secondary | ICD-10-CM | POA: Diagnosis not present

## 2017-07-21 DIAGNOSIS — Z79891 Long term (current) use of opiate analgesic: Secondary | ICD-10-CM | POA: Diagnosis not present

## 2017-07-21 DIAGNOSIS — M545 Low back pain: Secondary | ICD-10-CM | POA: Diagnosis not present

## 2017-07-21 DIAGNOSIS — M79605 Pain in left leg: Secondary | ICD-10-CM | POA: Diagnosis not present

## 2017-07-21 DIAGNOSIS — G894 Chronic pain syndrome: Secondary | ICD-10-CM | POA: Diagnosis not present

## 2017-07-23 ENCOUNTER — Other Ambulatory Visit: Payer: Self-pay | Admitting: Gastroenterology

## 2017-07-23 DIAGNOSIS — K7460 Unspecified cirrhosis of liver: Secondary | ICD-10-CM

## 2017-08-02 DIAGNOSIS — J45909 Unspecified asthma, uncomplicated: Secondary | ICD-10-CM | POA: Diagnosis not present

## 2017-08-02 DIAGNOSIS — K5900 Constipation, unspecified: Secondary | ICD-10-CM | POA: Diagnosis not present

## 2017-08-02 DIAGNOSIS — G894 Chronic pain syndrome: Secondary | ICD-10-CM | POA: Diagnosis not present

## 2017-08-02 DIAGNOSIS — R296 Repeated falls: Secondary | ICD-10-CM | POA: Diagnosis not present

## 2017-08-12 ENCOUNTER — Other Ambulatory Visit: Payer: Medicare HMO | Admitting: Orthotics

## 2017-08-12 ENCOUNTER — Other Ambulatory Visit: Payer: Self-pay | Admitting: Family

## 2017-08-14 ENCOUNTER — Other Ambulatory Visit: Payer: Medicare HMO | Admitting: Orthotics

## 2017-08-18 DIAGNOSIS — S72012A Unspecified intracapsular fracture of left femur, initial encounter for closed fracture: Secondary | ICD-10-CM | POA: Diagnosis not present

## 2017-08-18 DIAGNOSIS — M6281 Muscle weakness (generalized): Secondary | ICD-10-CM | POA: Diagnosis not present

## 2017-08-18 DIAGNOSIS — R278 Other lack of coordination: Secondary | ICD-10-CM | POA: Diagnosis not present

## 2017-08-18 DIAGNOSIS — R269 Unspecified abnormalities of gait and mobility: Secondary | ICD-10-CM | POA: Diagnosis not present

## 2017-08-18 DIAGNOSIS — Z4789 Encounter for other orthopedic aftercare: Secondary | ICD-10-CM | POA: Diagnosis not present

## 2017-08-18 DIAGNOSIS — J4550 Severe persistent asthma, uncomplicated: Secondary | ICD-10-CM | POA: Diagnosis not present

## 2017-08-18 DIAGNOSIS — Z5181 Encounter for therapeutic drug level monitoring: Secondary | ICD-10-CM | POA: Diagnosis not present

## 2017-08-20 ENCOUNTER — Other Ambulatory Visit: Payer: Medicare HMO

## 2017-08-20 DIAGNOSIS — M79605 Pain in left leg: Secondary | ICD-10-CM | POA: Diagnosis not present

## 2017-08-20 DIAGNOSIS — G894 Chronic pain syndrome: Secondary | ICD-10-CM | POA: Diagnosis not present

## 2017-08-20 DIAGNOSIS — M545 Low back pain: Secondary | ICD-10-CM | POA: Diagnosis not present

## 2017-08-20 DIAGNOSIS — Z79891 Long term (current) use of opiate analgesic: Secondary | ICD-10-CM | POA: Diagnosis not present

## 2017-08-26 ENCOUNTER — Other Ambulatory Visit: Payer: Medicare HMO

## 2017-08-28 ENCOUNTER — Ambulatory Visit: Payer: Medicare HMO

## 2017-09-06 DIAGNOSIS — G4700 Insomnia, unspecified: Secondary | ICD-10-CM | POA: Diagnosis not present

## 2017-09-06 DIAGNOSIS — M5416 Radiculopathy, lumbar region: Secondary | ICD-10-CM | POA: Diagnosis not present

## 2017-09-06 DIAGNOSIS — K5900 Constipation, unspecified: Secondary | ICD-10-CM | POA: Diagnosis not present

## 2017-09-06 DIAGNOSIS — E782 Mixed hyperlipidemia: Secondary | ICD-10-CM | POA: Diagnosis not present

## 2017-09-09 ENCOUNTER — Ambulatory Visit: Payer: Medicare HMO | Admitting: Orthotics

## 2017-09-09 DIAGNOSIS — M2170 Unequal limb length (acquired), unspecified site: Secondary | ICD-10-CM

## 2017-09-09 NOTE — Progress Notes (Signed)
Patient was confused as to where she left shoe to be lifted.  Found out that [employer] is doing service.

## 2017-09-18 ENCOUNTER — Ambulatory Visit: Payer: Medicare HMO

## 2017-09-18 DIAGNOSIS — R269 Unspecified abnormalities of gait and mobility: Secondary | ICD-10-CM | POA: Diagnosis not present

## 2017-09-18 DIAGNOSIS — J4550 Severe persistent asthma, uncomplicated: Secondary | ICD-10-CM | POA: Diagnosis not present

## 2017-09-18 DIAGNOSIS — S72012A Unspecified intracapsular fracture of left femur, initial encounter for closed fracture: Secondary | ICD-10-CM | POA: Diagnosis not present

## 2017-09-18 DIAGNOSIS — R278 Other lack of coordination: Secondary | ICD-10-CM | POA: Diagnosis not present

## 2017-09-18 DIAGNOSIS — Z4789 Encounter for other orthopedic aftercare: Secondary | ICD-10-CM | POA: Diagnosis not present

## 2017-09-18 DIAGNOSIS — Z5181 Encounter for therapeutic drug level monitoring: Secondary | ICD-10-CM | POA: Diagnosis not present

## 2017-09-18 DIAGNOSIS — M6281 Muscle weakness (generalized): Secondary | ICD-10-CM | POA: Diagnosis not present

## 2017-09-19 DIAGNOSIS — M79605 Pain in left leg: Secondary | ICD-10-CM | POA: Diagnosis not present

## 2017-09-19 DIAGNOSIS — Z79891 Long term (current) use of opiate analgesic: Secondary | ICD-10-CM | POA: Diagnosis not present

## 2017-09-19 DIAGNOSIS — M545 Low back pain: Secondary | ICD-10-CM | POA: Diagnosis not present

## 2017-09-19 DIAGNOSIS — G894 Chronic pain syndrome: Secondary | ICD-10-CM | POA: Diagnosis not present

## 2017-10-10 DIAGNOSIS — F341 Dysthymic disorder: Secondary | ICD-10-CM | POA: Diagnosis not present

## 2017-10-10 DIAGNOSIS — G4700 Insomnia, unspecified: Secondary | ICD-10-CM | POA: Diagnosis not present

## 2017-10-10 DIAGNOSIS — G894 Chronic pain syndrome: Secondary | ICD-10-CM | POA: Diagnosis not present

## 2017-10-10 DIAGNOSIS — K5900 Constipation, unspecified: Secondary | ICD-10-CM | POA: Diagnosis not present

## 2017-10-10 DIAGNOSIS — J45909 Unspecified asthma, uncomplicated: Secondary | ICD-10-CM | POA: Diagnosis not present

## 2017-10-14 ENCOUNTER — Other Ambulatory Visit: Payer: Self-pay | Admitting: Family

## 2017-10-18 DIAGNOSIS — S72012A Unspecified intracapsular fracture of left femur, initial encounter for closed fracture: Secondary | ICD-10-CM | POA: Diagnosis not present

## 2017-10-18 DIAGNOSIS — Z5181 Encounter for therapeutic drug level monitoring: Secondary | ICD-10-CM | POA: Diagnosis not present

## 2017-10-18 DIAGNOSIS — R269 Unspecified abnormalities of gait and mobility: Secondary | ICD-10-CM | POA: Diagnosis not present

## 2017-10-18 DIAGNOSIS — M6281 Muscle weakness (generalized): Secondary | ICD-10-CM | POA: Diagnosis not present

## 2017-10-18 DIAGNOSIS — J4550 Severe persistent asthma, uncomplicated: Secondary | ICD-10-CM | POA: Diagnosis not present

## 2017-10-18 DIAGNOSIS — Z4789 Encounter for other orthopedic aftercare: Secondary | ICD-10-CM | POA: Diagnosis not present

## 2017-10-18 DIAGNOSIS — R278 Other lack of coordination: Secondary | ICD-10-CM | POA: Diagnosis not present

## 2017-10-29 DIAGNOSIS — G894 Chronic pain syndrome: Secondary | ICD-10-CM | POA: Diagnosis not present

## 2017-10-29 DIAGNOSIS — Z79891 Long term (current) use of opiate analgesic: Secondary | ICD-10-CM | POA: Diagnosis not present

## 2017-10-29 DIAGNOSIS — M545 Low back pain: Secondary | ICD-10-CM | POA: Diagnosis not present

## 2017-10-29 DIAGNOSIS — M79605 Pain in left leg: Secondary | ICD-10-CM | POA: Diagnosis not present

## 2017-11-12 DIAGNOSIS — G894 Chronic pain syndrome: Secondary | ICD-10-CM | POA: Diagnosis not present

## 2017-11-12 DIAGNOSIS — L282 Other prurigo: Secondary | ICD-10-CM | POA: Diagnosis not present

## 2017-11-12 DIAGNOSIS — D649 Anemia, unspecified: Secondary | ICD-10-CM | POA: Diagnosis not present

## 2017-11-12 DIAGNOSIS — F341 Dysthymic disorder: Secondary | ICD-10-CM | POA: Diagnosis not present

## 2017-11-12 DIAGNOSIS — G4700 Insomnia, unspecified: Secondary | ICD-10-CM | POA: Diagnosis not present

## 2017-11-18 DIAGNOSIS — M6281 Muscle weakness (generalized): Secondary | ICD-10-CM | POA: Diagnosis not present

## 2017-11-18 DIAGNOSIS — Z5181 Encounter for therapeutic drug level monitoring: Secondary | ICD-10-CM | POA: Diagnosis not present

## 2017-11-18 DIAGNOSIS — Z4789 Encounter for other orthopedic aftercare: Secondary | ICD-10-CM | POA: Diagnosis not present

## 2017-11-18 DIAGNOSIS — R269 Unspecified abnormalities of gait and mobility: Secondary | ICD-10-CM | POA: Diagnosis not present

## 2017-11-18 DIAGNOSIS — S72012A Unspecified intracapsular fracture of left femur, initial encounter for closed fracture: Secondary | ICD-10-CM | POA: Diagnosis not present

## 2017-11-18 DIAGNOSIS — R278 Other lack of coordination: Secondary | ICD-10-CM | POA: Diagnosis not present

## 2017-11-18 DIAGNOSIS — J4550 Severe persistent asthma, uncomplicated: Secondary | ICD-10-CM | POA: Diagnosis not present

## 2017-12-03 DIAGNOSIS — D485 Neoplasm of uncertain behavior of skin: Secondary | ICD-10-CM | POA: Diagnosis not present

## 2017-12-03 DIAGNOSIS — M79605 Pain in left leg: Secondary | ICD-10-CM | POA: Diagnosis not present

## 2017-12-03 DIAGNOSIS — L299 Pruritus, unspecified: Secondary | ICD-10-CM | POA: Diagnosis not present

## 2017-12-03 DIAGNOSIS — M545 Low back pain: Secondary | ICD-10-CM | POA: Diagnosis not present

## 2017-12-03 DIAGNOSIS — Z79891 Long term (current) use of opiate analgesic: Secondary | ICD-10-CM | POA: Diagnosis not present

## 2017-12-03 DIAGNOSIS — G894 Chronic pain syndrome: Secondary | ICD-10-CM | POA: Diagnosis not present

## 2017-12-03 DIAGNOSIS — L309 Dermatitis, unspecified: Secondary | ICD-10-CM | POA: Diagnosis not present

## 2017-12-07 DIAGNOSIS — T148XXA Other injury of unspecified body region, initial encounter: Secondary | ICD-10-CM | POA: Diagnosis not present

## 2017-12-19 DIAGNOSIS — M62838 Other muscle spasm: Secondary | ICD-10-CM | POA: Diagnosis not present

## 2017-12-19 DIAGNOSIS — R278 Other lack of coordination: Secondary | ICD-10-CM | POA: Diagnosis not present

## 2017-12-19 DIAGNOSIS — Z5181 Encounter for therapeutic drug level monitoring: Secondary | ICD-10-CM | POA: Diagnosis not present

## 2017-12-19 DIAGNOSIS — G4700 Insomnia, unspecified: Secondary | ICD-10-CM | POA: Diagnosis not present

## 2017-12-19 DIAGNOSIS — S72012A Unspecified intracapsular fracture of left femur, initial encounter for closed fracture: Secondary | ICD-10-CM | POA: Diagnosis not present

## 2017-12-19 DIAGNOSIS — K766 Portal hypertension: Secondary | ICD-10-CM | POA: Diagnosis not present

## 2017-12-19 DIAGNOSIS — M6281 Muscle weakness (generalized): Secondary | ICD-10-CM | POA: Diagnosis not present

## 2017-12-19 DIAGNOSIS — G894 Chronic pain syndrome: Secondary | ICD-10-CM | POA: Diagnosis not present

## 2017-12-19 DIAGNOSIS — Z4789 Encounter for other orthopedic aftercare: Secondary | ICD-10-CM | POA: Diagnosis not present

## 2017-12-19 DIAGNOSIS — J4550 Severe persistent asthma, uncomplicated: Secondary | ICD-10-CM | POA: Diagnosis not present

## 2017-12-19 DIAGNOSIS — R269 Unspecified abnormalities of gait and mobility: Secondary | ICD-10-CM | POA: Diagnosis not present

## 2017-12-19 DIAGNOSIS — F341 Dysthymic disorder: Secondary | ICD-10-CM | POA: Diagnosis not present

## 2017-12-24 DIAGNOSIS — L309 Dermatitis, unspecified: Secondary | ICD-10-CM | POA: Diagnosis not present

## 2017-12-24 DIAGNOSIS — L299 Pruritus, unspecified: Secondary | ICD-10-CM | POA: Diagnosis not present

## 2017-12-24 DIAGNOSIS — L280 Lichen simplex chronicus: Secondary | ICD-10-CM | POA: Diagnosis not present

## 2017-12-24 DIAGNOSIS — T148XXA Other injury of unspecified body region, initial encounter: Secondary | ICD-10-CM | POA: Diagnosis not present

## 2018-01-07 DIAGNOSIS — M79605 Pain in left leg: Secondary | ICD-10-CM | POA: Diagnosis not present

## 2018-01-07 DIAGNOSIS — G894 Chronic pain syndrome: Secondary | ICD-10-CM | POA: Diagnosis not present

## 2018-01-07 DIAGNOSIS — Z79891 Long term (current) use of opiate analgesic: Secondary | ICD-10-CM | POA: Diagnosis not present

## 2018-01-07 DIAGNOSIS — M545 Low back pain: Secondary | ICD-10-CM | POA: Diagnosis not present

## 2018-01-16 DIAGNOSIS — J4550 Severe persistent asthma, uncomplicated: Secondary | ICD-10-CM | POA: Diagnosis not present

## 2018-01-16 DIAGNOSIS — R278 Other lack of coordination: Secondary | ICD-10-CM | POA: Diagnosis not present

## 2018-01-16 DIAGNOSIS — S72012A Unspecified intracapsular fracture of left femur, initial encounter for closed fracture: Secondary | ICD-10-CM | POA: Diagnosis not present

## 2018-01-16 DIAGNOSIS — R269 Unspecified abnormalities of gait and mobility: Secondary | ICD-10-CM | POA: Diagnosis not present

## 2018-01-16 DIAGNOSIS — M6281 Muscle weakness (generalized): Secondary | ICD-10-CM | POA: Diagnosis not present

## 2018-01-16 DIAGNOSIS — Z4789 Encounter for other orthopedic aftercare: Secondary | ICD-10-CM | POA: Diagnosis not present

## 2018-01-16 DIAGNOSIS — Z5181 Encounter for therapeutic drug level monitoring: Secondary | ICD-10-CM | POA: Diagnosis not present

## 2018-01-21 DIAGNOSIS — L299 Pruritus, unspecified: Secondary | ICD-10-CM | POA: Diagnosis not present

## 2018-01-21 DIAGNOSIS — L280 Lichen simplex chronicus: Secondary | ICD-10-CM | POA: Diagnosis not present

## 2018-01-21 DIAGNOSIS — L281 Prurigo nodularis: Secondary | ICD-10-CM | POA: Diagnosis not present

## 2018-02-03 DIAGNOSIS — E785 Hyperlipidemia, unspecified: Secondary | ICD-10-CM | POA: Diagnosis not present

## 2018-02-03 DIAGNOSIS — K219 Gastro-esophageal reflux disease without esophagitis: Secondary | ICD-10-CM | POA: Diagnosis not present

## 2018-02-03 DIAGNOSIS — F419 Anxiety disorder, unspecified: Secondary | ICD-10-CM | POA: Diagnosis not present

## 2018-02-03 DIAGNOSIS — J3089 Other allergic rhinitis: Secondary | ICD-10-CM | POA: Diagnosis not present

## 2018-02-03 DIAGNOSIS — K7030 Alcoholic cirrhosis of liver without ascites: Secondary | ICD-10-CM | POA: Diagnosis not present

## 2018-02-03 DIAGNOSIS — J4540 Moderate persistent asthma, uncomplicated: Secondary | ICD-10-CM | POA: Diagnosis not present

## 2018-02-03 DIAGNOSIS — G4709 Other insomnia: Secondary | ICD-10-CM | POA: Diagnosis not present

## 2018-02-03 DIAGNOSIS — D508 Other iron deficiency anemias: Secondary | ICD-10-CM | POA: Diagnosis not present

## 2018-02-04 DIAGNOSIS — Z79891 Long term (current) use of opiate analgesic: Secondary | ICD-10-CM | POA: Diagnosis not present

## 2018-02-04 DIAGNOSIS — M545 Low back pain: Secondary | ICD-10-CM | POA: Diagnosis not present

## 2018-02-04 DIAGNOSIS — G894 Chronic pain syndrome: Secondary | ICD-10-CM | POA: Diagnosis not present

## 2018-02-04 DIAGNOSIS — M79605 Pain in left leg: Secondary | ICD-10-CM | POA: Diagnosis not present

## 2018-02-16 DIAGNOSIS — Z4789 Encounter for other orthopedic aftercare: Secondary | ICD-10-CM | POA: Diagnosis not present

## 2018-02-16 DIAGNOSIS — J4550 Severe persistent asthma, uncomplicated: Secondary | ICD-10-CM | POA: Diagnosis not present

## 2018-02-16 DIAGNOSIS — R278 Other lack of coordination: Secondary | ICD-10-CM | POA: Diagnosis not present

## 2018-02-16 DIAGNOSIS — M6281 Muscle weakness (generalized): Secondary | ICD-10-CM | POA: Diagnosis not present

## 2018-02-16 DIAGNOSIS — Z5181 Encounter for therapeutic drug level monitoring: Secondary | ICD-10-CM | POA: Diagnosis not present

## 2018-02-16 DIAGNOSIS — R269 Unspecified abnormalities of gait and mobility: Secondary | ICD-10-CM | POA: Diagnosis not present

## 2018-02-16 DIAGNOSIS — S72012A Unspecified intracapsular fracture of left femur, initial encounter for closed fracture: Secondary | ICD-10-CM | POA: Diagnosis not present

## 2018-03-04 DIAGNOSIS — M545 Low back pain: Secondary | ICD-10-CM | POA: Diagnosis not present

## 2018-03-04 DIAGNOSIS — Z79891 Long term (current) use of opiate analgesic: Secondary | ICD-10-CM | POA: Diagnosis not present

## 2018-03-04 DIAGNOSIS — G894 Chronic pain syndrome: Secondary | ICD-10-CM | POA: Diagnosis not present

## 2018-03-04 DIAGNOSIS — M79605 Pain in left leg: Secondary | ICD-10-CM | POA: Diagnosis not present

## 2018-03-12 DIAGNOSIS — J45909 Unspecified asthma, uncomplicated: Secondary | ICD-10-CM | POA: Diagnosis not present

## 2018-03-12 DIAGNOSIS — F341 Dysthymic disorder: Secondary | ICD-10-CM | POA: Diagnosis not present

## 2018-03-12 DIAGNOSIS — G894 Chronic pain syndrome: Secondary | ICD-10-CM | POA: Diagnosis not present

## 2018-03-12 DIAGNOSIS — K219 Gastro-esophageal reflux disease without esophagitis: Secondary | ICD-10-CM | POA: Diagnosis not present

## 2018-03-12 DIAGNOSIS — Z0000 Encounter for general adult medical examination without abnormal findings: Secondary | ICD-10-CM | POA: Diagnosis not present

## 2018-03-12 DIAGNOSIS — M62838 Other muscle spasm: Secondary | ICD-10-CM | POA: Diagnosis not present

## 2018-03-12 DIAGNOSIS — G4700 Insomnia, unspecified: Secondary | ICD-10-CM | POA: Diagnosis not present

## 2018-03-14 ENCOUNTER — Emergency Department (HOSPITAL_COMMUNITY): Payer: Medicare HMO

## 2018-03-14 ENCOUNTER — Encounter (HOSPITAL_COMMUNITY): Payer: Self-pay

## 2018-03-14 ENCOUNTER — Other Ambulatory Visit: Payer: Self-pay

## 2018-03-14 ENCOUNTER — Inpatient Hospital Stay (HOSPITAL_COMMUNITY)
Admission: EM | Admit: 2018-03-14 | Discharge: 2018-03-17 | DRG: 918 | Disposition: A | Discharge status: Home Health | Payer: Medicare HMO | Attending: Internal Medicine | Admitting: Internal Medicine

## 2018-03-14 DIAGNOSIS — K219 Gastro-esophageal reflux disease without esophagitis: Secondary | ICD-10-CM | POA: Diagnosis present

## 2018-03-14 DIAGNOSIS — N179 Acute kidney failure, unspecified: Secondary | ICD-10-CM | POA: Diagnosis not present

## 2018-03-14 DIAGNOSIS — T40601D Poisoning by unspecified narcotics, accidental (unintentional), subsequent encounter: Secondary | ICD-10-CM | POA: Diagnosis not present

## 2018-03-14 DIAGNOSIS — Y900 Blood alcohol level of less than 20 mg/100 ml: Secondary | ICD-10-CM | POA: Diagnosis present

## 2018-03-14 DIAGNOSIS — M160 Bilateral primary osteoarthritis of hip: Secondary | ICD-10-CM | POA: Diagnosis present

## 2018-03-14 DIAGNOSIS — G894 Chronic pain syndrome: Secondary | ICD-10-CM | POA: Diagnosis not present

## 2018-03-14 DIAGNOSIS — Z8619 Personal history of other infectious and parasitic diseases: Secondary | ICD-10-CM

## 2018-03-14 DIAGNOSIS — T402X1A Poisoning by other opioids, accidental (unintentional), initial encounter: Principal | ICD-10-CM | POA: Diagnosis present

## 2018-03-14 DIAGNOSIS — Z905 Acquired absence of kidney: Secondary | ICD-10-CM

## 2018-03-14 DIAGNOSIS — Y92003 Bedroom of unspecified non-institutional (private) residence as the place of occurrence of the external cause: Secondary | ICD-10-CM

## 2018-03-14 DIAGNOSIS — Z8249 Family history of ischemic heart disease and other diseases of the circulatory system: Secondary | ICD-10-CM

## 2018-03-14 DIAGNOSIS — Z884 Allergy status to anesthetic agent status: Secondary | ICD-10-CM

## 2018-03-14 DIAGNOSIS — R410 Disorientation, unspecified: Secondary | ICD-10-CM | POA: Diagnosis not present

## 2018-03-14 DIAGNOSIS — Z90710 Acquired absence of both cervix and uterus: Secondary | ICD-10-CM | POA: Diagnosis not present

## 2018-03-14 DIAGNOSIS — E7800 Pure hypercholesterolemia, unspecified: Secondary | ICD-10-CM | POA: Diagnosis present

## 2018-03-14 DIAGNOSIS — F32A Depression, unspecified: Secondary | ICD-10-CM | POA: Diagnosis present

## 2018-03-14 DIAGNOSIS — T400X1A Poisoning by opium, accidental (unintentional), initial encounter: Secondary | ICD-10-CM | POA: Diagnosis not present

## 2018-03-14 DIAGNOSIS — G4700 Insomnia, unspecified: Secondary | ICD-10-CM | POA: Diagnosis present

## 2018-03-14 DIAGNOSIS — Z885 Allergy status to narcotic agent status: Secondary | ICD-10-CM

## 2018-03-14 DIAGNOSIS — T402X4D Poisoning by other opioids, undetermined, subsequent encounter: Secondary | ICD-10-CM | POA: Diagnosis not present

## 2018-03-14 DIAGNOSIS — Z79899 Other long term (current) drug therapy: Secondary | ICD-10-CM

## 2018-03-14 DIAGNOSIS — F1110 Opioid abuse, uncomplicated: Secondary | ICD-10-CM

## 2018-03-14 DIAGNOSIS — T50904A Poisoning by unspecified drugs, medicaments and biological substances, undetermined, initial encounter: Secondary | ICD-10-CM | POA: Diagnosis not present

## 2018-03-14 DIAGNOSIS — J45909 Unspecified asthma, uncomplicated: Secondary | ICD-10-CM | POA: Diagnosis not present

## 2018-03-14 DIAGNOSIS — R4587 Impulsiveness: Secondary | ICD-10-CM | POA: Diagnosis not present

## 2018-03-14 DIAGNOSIS — Z9049 Acquired absence of other specified parts of digestive tract: Secondary | ICD-10-CM

## 2018-03-14 DIAGNOSIS — F1010 Alcohol abuse, uncomplicated: Secondary | ICD-10-CM | POA: Diagnosis not present

## 2018-03-14 DIAGNOSIS — T40601A Poisoning by unspecified narcotics, accidental (unintentional), initial encounter: Secondary | ICD-10-CM | POA: Diagnosis not present

## 2018-03-14 DIAGNOSIS — T402X1D Poisoning by other opioids, accidental (unintentional), subsequent encounter: Secondary | ICD-10-CM | POA: Diagnosis not present

## 2018-03-14 DIAGNOSIS — E876 Hypokalemia: Secondary | ICD-10-CM | POA: Diagnosis present

## 2018-03-14 DIAGNOSIS — F4542 Pain disorder with related psychological factors: Secondary | ICD-10-CM | POA: Diagnosis not present

## 2018-03-14 DIAGNOSIS — F17210 Nicotine dependence, cigarettes, uncomplicated: Secondary | ICD-10-CM | POA: Diagnosis not present

## 2018-03-14 DIAGNOSIS — Z8349 Family history of other endocrine, nutritional and metabolic diseases: Secondary | ICD-10-CM | POA: Diagnosis not present

## 2018-03-14 DIAGNOSIS — M1711 Unilateral primary osteoarthritis, right knee: Secondary | ICD-10-CM | POA: Diagnosis present

## 2018-03-14 DIAGNOSIS — Z720 Tobacco use: Secondary | ICD-10-CM | POA: Diagnosis not present

## 2018-03-14 DIAGNOSIS — F329 Major depressive disorder, single episode, unspecified: Secondary | ICD-10-CM | POA: Diagnosis not present

## 2018-03-14 DIAGNOSIS — E785 Hyperlipidemia, unspecified: Secondary | ICD-10-CM | POA: Diagnosis present

## 2018-03-14 DIAGNOSIS — I8500 Esophageal varices without bleeding: Secondary | ICD-10-CM | POA: Diagnosis not present

## 2018-03-14 DIAGNOSIS — Z79891 Long term (current) use of opiate analgesic: Secondary | ICD-10-CM

## 2018-03-14 DIAGNOSIS — R030 Elevated blood-pressure reading, without diagnosis of hypertension: Secondary | ICD-10-CM | POA: Diagnosis not present

## 2018-03-14 DIAGNOSIS — F419 Anxiety disorder, unspecified: Secondary | ICD-10-CM | POA: Diagnosis present

## 2018-03-14 DIAGNOSIS — G8929 Other chronic pain: Secondary | ICD-10-CM | POA: Diagnosis not present

## 2018-03-14 DIAGNOSIS — F0632 Mood disorder due to known physiological condition with major depressive-like episode: Secondary | ICD-10-CM | POA: Diagnosis not present

## 2018-03-14 DIAGNOSIS — T50901A Poisoning by unspecified drugs, medicaments and biological substances, accidental (unintentional), initial encounter: Secondary | ICD-10-CM | POA: Diagnosis not present

## 2018-03-14 LAB — RAPID URINE DRUG SCREEN, HOSP PERFORMED
AMPHETAMINES: NOT DETECTED
Barbiturates: NOT DETECTED
Benzodiazepines: NOT DETECTED
Cocaine: NOT DETECTED
Opiates: POSITIVE — AB
TETRAHYDROCANNABINOL: NOT DETECTED

## 2018-03-14 LAB — COMPREHENSIVE METABOLIC PANEL
ALBUMIN: 3 g/dL — AB (ref 3.5–5.0)
ALK PHOS: 263 U/L — AB (ref 38–126)
ALT: 15 U/L (ref 14–54)
AST: 29 U/L (ref 15–41)
Anion gap: 12 (ref 5–15)
BUN: 12 mg/dL (ref 6–20)
CHLORIDE: 104 mmol/L (ref 101–111)
CO2: 24 mmol/L (ref 22–32)
CREATININE: 1.12 mg/dL — AB (ref 0.44–1.00)
Calcium: 8.5 mg/dL — ABNORMAL LOW (ref 8.9–10.3)
GFR calc non Af Amer: 49 mL/min — ABNORMAL LOW (ref >60)
GFR, EST AFRICAN AMERICAN: 57 mL/min — AB (ref >60)
GLUCOSE: 79 mg/dL (ref 65–99)
Potassium: 3.2 mmol/L — ABNORMAL LOW (ref 3.5–5.1)
SODIUM: 140 mmol/L (ref 135–145)
Total Bilirubin: 0.5 mg/dL (ref 0.3–1.2)
Total Protein: 6.8 g/dL (ref 6.5–8.1)

## 2018-03-14 LAB — CBC WITH DIFFERENTIAL/PLATELET
Basophils Absolute: 0 10*3/uL (ref 0.0–0.1)
Basophils Relative: 0 %
EOS ABS: 0.9 10*3/uL — AB (ref 0.0–0.7)
Eosinophils Relative: 12 %
HEMATOCRIT: 36.8 % (ref 36.0–46.0)
HEMOGLOBIN: 12.2 g/dL (ref 12.0–15.0)
LYMPHS ABS: 2.6 10*3/uL (ref 0.7–4.0)
Lymphocytes Relative: 33 %
MCH: 33 pg (ref 26.0–34.0)
MCHC: 33.2 g/dL (ref 30.0–36.0)
MCV: 99.5 fL (ref 78.0–100.0)
MONO ABS: 0.8 10*3/uL (ref 0.1–1.0)
MONOS PCT: 9 %
NEUTROS PCT: 46 %
Neutro Abs: 3.7 10*3/uL (ref 1.7–7.7)
Platelets: 364 10*3/uL (ref 150–400)
RBC: 3.7 MIL/uL — ABNORMAL LOW (ref 3.87–5.11)
RDW: 16.4 % — ABNORMAL HIGH (ref 11.5–15.5)
WBC: 8 10*3/uL (ref 4.0–10.5)

## 2018-03-14 LAB — SODIUM, URINE, RANDOM: Sodium, Ur: 20 mmol/L

## 2018-03-14 LAB — PROTIME-INR
INR: 1.12
Prothrombin Time: 14.3 seconds (ref 11.4–15.2)

## 2018-03-14 LAB — CBG MONITORING, ED: GLUCOSE-CAPILLARY: 76 mg/dL (ref 65–99)

## 2018-03-14 LAB — CREATININE, URINE, RANDOM: Creatinine, Urine: 118.34 mg/dL

## 2018-03-14 LAB — PHOSPHORUS: Phosphorus: 4.2 mg/dL (ref 2.5–4.6)

## 2018-03-14 LAB — MAGNESIUM: Magnesium: 1.3 mg/dL — ABNORMAL LOW (ref 1.7–2.4)

## 2018-03-14 LAB — ETHANOL

## 2018-03-14 LAB — ACETAMINOPHEN LEVEL

## 2018-03-14 MED ORDER — NALOXONE HCL 0.4 MG/ML IJ SOLN
0.4000 mg | Freq: Once | INTRAMUSCULAR | Status: AC
  Administered 2018-03-14: 0.4 mg via INTRAVENOUS
  Filled 2018-03-14: qty 1

## 2018-03-14 MED ORDER — MAGNESIUM SULFATE 50 % IJ SOLN: 2.0000 g | Freq: Once | INTRAMUSCULAR | Status: DC

## 2018-03-14 MED ORDER — POTASSIUM CHLORIDE CRYS ER 20 MEQ PO TBCR
40.0000 meq | EXTENDED_RELEASE_TABLET | Freq: Once | ORAL | Status: AC
  Administered 2018-03-14: 40 meq via ORAL
  Filled 2018-03-14: qty 2

## 2018-03-14 MED ORDER — SODIUM CHLORIDE 0.9 % IV SOLN
INTRAVENOUS | Status: DC
  Administered 2018-03-14 – 2018-03-16 (×2): via INTRAVENOUS

## 2018-03-14 MED ORDER — MAGNESIUM SULFATE 2 GM/50ML IV SOLN
2.0000 g | Freq: Once | INTRAVENOUS | Status: AC
  Administered 2018-03-14: 2 g via INTRAVENOUS
  Filled 2018-03-14: qty 50

## 2018-03-14 MED ORDER — NALOXONE HCL 4 MG/10ML IJ SOLN
0.2500 mg/h | INTRAVENOUS | Status: DC
  Administered 2018-03-14: 0.25 mg/h via INTRAVENOUS
  Filled 2018-03-14: qty 4

## 2018-03-14 NOTE — ED Notes (Signed)
ED Provider at bedside. 

## 2018-03-14 NOTE — H&P (Signed)
[name] INO:[id number] DOB: 1946/03/20 DOA: 03/14/2018     PCP: Damaris Hippo, MD   Outpatient Specialists:  NONE   Patient arrived to ER on 03/14/18 at 53  Patient coming from: home Lives   With family    Chief Complaint:  Chief Complaint  Patient presents with  . Drug Overdose    HPI: [name] is a 72 y.o. female with medical history significant of  GI bleeding and alcohol abuse, chronic pain, asthma, depression,   Presented with   episode of pain pills overdose.  Husband states he hit her oxycodone but then could not find the bottle he found her supine in the bed barely breathing EMS administered Narcan with patient quick recovery she denies any suicidal ideations stated she took only 2 tablets of oxycodone.  At some point patient did admit that they want her tablets that they were her husbands she figure out where he was hiding them.  Patient is frequently changing her story.  She states she takes her own medication unsure what exactly she is prescribed.  Regarding pertinent Chronic problems: History of upper GI bleed with gastropathy and esophageal varices in 2018 History of C. difficile colitis treated in 2018  While in ER: Required repeated doses of Narcan and eventually started on narcan drip acetaminophen level was negative. Urine drug screen was only positive for opioids  Following Medications were ordered in ER: Medications  0.9 %  sodium chloride infusion ( Intravenous New Bag/Given 03/14/18 1925)  naloxone HCl (NARCAN) 4 mg in dextrose 5 % 250 mL infusion (has no administration in time range)  naloxone [facility]) injection 0.4 mg (0.4 mg Intravenous Given 03/14/18 2004)  naloxone [facility]) injection 0.4 mg (0.4 mg Intravenous Given 03/14/18 2235)    Significant initial  Findings: Abnormal Labs Reviewed  COMPREHENSIVE METABOLIC PANEL - Abnormal; Notable for the following components:      Result Value   Potassium 3.2 (*)    Creatinine, Ser 1.12 (*)    Calcium  8.5 (*)    Albumin 3.0 (*)    Alkaline Phosphatase 263 (*)    GFR calc non Af Amer 49 (*)    GFR calc Af Amer 57 (*)    All other components within normal limits  CBC WITH DIFFERENTIAL/PLATELET - Abnormal; Notable for the following components:   RBC 3.70 (*)    RDW 16.4 (*)    Eosinophils Absolute 0.9 (*)    All other components within normal limits  RAPID URINE DRUG SCREEN, HOSP PERFORMED - Abnormal; Notable for the following components:   Opiates POSITIVE (*)    All other components within normal limits  ACETAMINOPHEN LEVEL - Abnormal; Notable for the following components:   Acetaminophen (Tylenol), Serum <10 (*)    All other components within normal limits     Na 140 K 3.2  Cr   Up from baseline see below Lab Results  Component Value Date   CREATININE 1.12 (H) 03/14/2018   CREATININE 0.50 12/11/2016   CREATININE 0.57 12/10/2016      WBC  8.0  HG/HCT * stable,  Down *Up from baseline see below    Component Value Date/Time   HGB 12.2 03/14/2018 1832   HCT 36.8 03/14/2018 1832       UA  not ordered  CXR - NON acute     ECG:  Personally reviewed by me showing: HR : 99 Rhythm:  NSR,    no evidence of acute  ischemic  changes QTC 477     ED Triage Vitals  Enc Vitals Group     BP 03/14/18 1822 131/71     Pulse Rate 03/14/18 1822 (!) 102     Resp 03/14/18 1822 10     Temp --      Temp src --      SpO2 03/14/18 1810 95 %     Weight --      Height --      Head Circumference --      Peak Flow --      Pain Score 03/14/18 1819 0     Pain Loc --      Pain Edu? --      Excl. in [place]? --   TMAX(24)@       Latest  Blood pressure (!) 101/59, pulse 89, resp. rate 12, SpO2 94 %.     Hospitalist was called for admission for opioid overdose requiring Narcan drip   Review of Systems:    Pertinent positives include:confusion, sedation,   Constitutional:  No weight loss, night sweats, Fevers, chills, fatigue, weight loss  HEENT:  No headaches, Difficulty  swallowing,Tooth/dental problems,Sore throat,  No sneezing, itching, ear ache, nasal congestion, post nasal drip,  Cardio-vascular:  No chest pain, Orthopnea, PND, anasarca, dizziness, palpitations.no Bilateral lower extremity swelling  GI:  No heartburn, indigestion, abdominal pain, nausea, vomiting, diarrhea, change in bowel habits, loss of appetite, melena, blood in stool, hematemesis Resp:  no shortness of breath at rest. No dyspnea on exertion, No excess mucus, no productive cough, No non-productive cough, No coughing up of blood.No change in color of mucus.No wheezing. Skin:  no rash or lesions. No jaundice GU:  no dysuria, change in color of urine, no urgency or frequency. No straining to urinate.  No flank pain.  Musculoskeletal:  No joint pain or no joint swelling. No decreased range of motion. No back pain.  Psych:  No change in mood or affect. No depression or anxiety. No memory loss.  Neuro: no localizing neurological complaints, no tingling, no weakness, no double vision, no gait abnormality, no slurred speech,    As per HPI otherwise 10 point review of systems negative.   Past Medical History:   Past Medical History:  Diagnosis Date  . Alcohol abuse   . Alcohol abuse   . Allergy   . Arthritis    back-severe, hips, right knee  . Asthma   . GERD (gastroesophageal reflux disease)    occasional  . H/O measles   . H/O mumps   . Hypercholesteremia    under control  . Insomnia   . Psoriasis (a type of skin inflammation)   . Renal cell carcinoma 2012   left  . Seasonal allergies       Past Surgical History:  Procedure Laterality Date  . ABDOMINAL HYSTERECTOMY  40years ago  . BUNIONECTOMY  04/2011  . CHOLECYSTECTOMY  11/13/2011   Procedure: LAPAROSCOPIC CHOLECYSTECTOMY WITH INTRAOPERATIVE CHOLANGIOGRAM;  Surgeon: Judieth Keens, DO;  Location: [facility];  Service: General;  Laterality: N/A;  . COLONOSCOPY N/A 09/25/2013   Procedure: COLONOSCOPY;  Surgeon:  Lear Ng, MD;  Location: [facility];  Service: Endoscopy;  Laterality: N/A;  . ESOPHAGOGASTRODUODENOSCOPY N/A 09/25/2013   Procedure: ESOPHAGOGASTRODUODENOSCOPY (EGD);  Surgeon: Lear Ng, MD;  Location: [facility];  Service: Endoscopy;  Laterality: N/A;  . ESOPHAGOGASTRODUODENOSCOPY N/A 04/07/2016   Procedure: ESOPHAGOGASTRODUODENOSCOPY (EGD);  Surgeon: Milus Banister, MD;  Location: [facility];  Service:  Endoscopy;  Laterality: N/A;  . ESOPHAGOGASTRODUODENOSCOPY N/A 10/26/2016   Procedure: ESOPHAGOGASTRODUODENOSCOPY (EGD);  Surgeon: Arta Silence, MD;  Location: [facility];  Service: Endoscopy;  Laterality: N/A;  . ESOPHAGOGASTRODUODENOSCOPY N/A 12/07/2016   Procedure: ESOPHAGOGASTRODUODENOSCOPY (EGD);  Surgeon: Wonda Horner, MD;  Location: [facility];  Service: Endoscopy;  Laterality: N/A;  . ESOPHAGOGASTRODUODENOSCOPY (EGD) WITH PROPOFOL Left 01/03/2016   Procedure: ESOPHAGOGASTRODUODENOSCOPY (EGD) WITH PROPOFOL;  Surgeon: Arta Silence, MD;  Location: [facility];  Service: Endoscopy;  Laterality: Left;  . FEMUR IM NAIL Left 04/15/2016   Procedure: INTRAMEDULLARY (IM) RETROGRADE FEMORAL NAILING;  Surgeon: Gaynelle Arabian, MD;  Location: [facility];  Service: Orthopedics;  Laterality: Left;  . HERNIA REPAIR  [id number]/8676   supraumbilical repair  . KIDNEY SURGERY  12/2010   [facility]; partial nephrectomy  . LUMBAR LAMINECTOMY/DECOMPRESSION MICRODISCECTOMY Right 10/12/2014   Procedure: HEMI LAMINECTOMY MICRODISCECTOMY L5-S1 RIGHT (1 LEVEL);  Surgeon: Tobi Bastos, MD;  Location: [facility];  Service: Orthopedics;  Laterality: Right;  . MENISECTOMY  2010   left knee  . TUBAL LIGATION  44 years ago    Social History:      reports that she has been smoking cigarettes.  She has a 0.90 pack-year smoking history. She has never used smokeless tobacco. She reports that she drinks about 1.2 oz of alcohol per week. She reports that she does not use drugs.     Family History:     Family History  Problem Relation Age of Onset  . Hyperlipidemia Mother   . Hypertension Mother     Allergies: Allergies  Allergen Reactions  . Azithromycin Itching and Swelling  . Morphine And Related Itching     Prior to Admission medications   Medication Sig Start Date End Date Taking? Authorizing Provider  albuterol (PROVENTIL HFA;VENTOLIN HFA) 108 (90 Base) MCG/ACT inhaler Inhale 2 puffs into the lungs every 6 (six) hours as needed for wheezing or shortness of breath. 08/05/16   Golden Circle, FNP  cholestyramine (QUESTRAN) 4 g packet Take 1 packet (4 g total) by mouth 2 (two) times daily. 12/14/16   Charlynne Cousins, MD  cyclobenzaprine (FLEXERIL) 10 MG tablet Take 10 mg by mouth 3 (three) times daily as needed for muscle spasms.    [provider]  DULoxetine (CYMBALTA) 60 MG capsule Take 1 capsule (60 mg total) by mouth daily. 08/05/16   Golden Circle, FNP  famotidine (PEPCID) 20 MG tablet Take 20 mg by mouth 2 (two) times daily.    [provider]  ferrous sulfate 325 (65 FE) MG tablet Take 1 tablet (325 mg total) by mouth 2 (two) times daily with a meal. 10/27/16   Janece Canterbury, MD  Fluticasone-Salmeterol (ADVAIR DISKUS) 500-50 MCG/DOSE AEPB INHALE 1 PUFF INTO THE LUNGS TWICE A DAY 08/01/16   Golden Circle, FNP  folic acid (FOLVITE) 1 MG tablet Take 1 tablet (1 mg total) by mouth daily. 04/18/16   Ghimire, Henreitta Leber, MD  HYDROcodone-acetaminophen (NORCO/VICODIN) 5-325 MG tablet Take 1 tablet by mouth every 6 (six) hours as needed. for pain 11/12/16   [provider]  Icosapent Ethyl (VASCEPA) 1 g CAPS Take 1 g by mouth 2 (two) times daily.    [provider]  LORazepam (ATIVAN) 1 MG tablet Take 1 tablet (1 mg total) by mouth 2 (two) times daily as needed for anxiety (Anxiety, shakiness. DO NOT USE WITH ALCOHOL.). 08/01/16   Golden Circle, FNP  magnesium oxide (MAG-OX) 400 MG tablet Take 400  mg by mouth daily as needed.     [provider]  methocarbamol (ROBAXIN) 500 MG tablet Take 750 mg by mouth every 4 (four) hours as needed for muscle spasms.    [provider]  montelukast (SINGULAIR) 10 MG tablet Take 1 tablet (10 mg total) by mouth at bedtime. 08/01/16   Golden Circle, FNP  Multiple Vitamin (MULTIVITAMIN WITH MINERALS) TABS tablet Take 1 tablet by mouth daily. 04/18/16   Ghimire, Henreitta Leber, MD  pantoprazole (PROTONIX) 40 MG tablet Take 1 tablet (40 mg total) by mouth daily. 10/27/16   Janece Canterbury, MD  PARoxetine (PAXIL) 10 MG tablet Take 0.5 tablets (5 mg total) by mouth daily. 08/01/16   Golden Circle, FNP  potassium chloride SA (K-DUR,KLOR-CON) 20 MEQ tablet Take 1 tablet (20 mEq total) by mouth daily. 08/05/16   Golden Circle, FNP  propranolol (INDERAL) 10 MG tablet Take 1 tablet (10 mg total) by mouth 2 (two) times daily. 10/27/16   Janece Canterbury, MD  simvastatin (ZOCOR) 40 MG tablet Take 1 tablet (40 mg total) by mouth every morning. 08/05/16   Golden Circle, FNP  sucralfate (CARAFATE) 1 g tablet Take 1 g by mouth 4 (four) times daily. Take 1 tablet at bedtime on an empty stomach and before meals BID     [provider]  thiamine 100 MG tablet Take 1 tablet (100 mg total) by mouth daily. 04/18/16   Ghimire, Henreitta Leber, MD  zolpidem (AMBIEN) 5 MG tablet Take 1 tablet (5 mg total) by mouth at bedtime as needed. for sleep Patient not taking: Reported on 12/07/2016 08/01/16   Golden Circle, FNP   Physical Exam: Blood pressure (!) 101/59, pulse 89, resp. rate 12, SpO2 94 %. 1. General:  in No Acute distress   Chronically ill * -appearing 2. Psychological: Alert and   Oriented to self 3. Head/ENT:     Dry Mucous Membranes                          Head Non traumatic, neck supple                            Poor Dentition 4. SKIN:   decreased Skin turgor,  Skin clean Dry and intact no rash 5. Heart: Regular rate and rhythm no  Murmur, no Rub or gallop 6. Lungs:  no  wheezes or crackles   7. Abdomen: Soft, non-tender, Non distended bowel sounds present 8. Lower extremities: no clubbing, cyanosis, or edema 9. Neurologically Grossly intact, moving all 4 extremities equally   10. MSK: Normal range of motion   LABS:     Recent Labs  Lab 03/14/18 1832  WBC 8.0  NEUTROABS 3.7  HGB 12.2  HCT 36.8  MCV 99.5  PLT 093   Basic Metabolic Panel: Recent Labs  Lab 03/14/18 1832  NA 140  K 3.2*  CL 104  CO2 24  GLUCOSE 79  BUN 12  CREATININE 1.12*  CALCIUM 8.5*      Recent Labs  Lab 03/14/18 1832  AST 29  ALT 15  ALKPHOS 263*  BILITOT 0.5  PROT 6.8  ALBUMIN 3.0*   No results for input(s): LIPASE, AMYLASE in the last 168 hours. No results for input(s): AMMONIA in the last 168 hours.    HbA1C: No results for input(s): HGBA1C in the last 72 hours. CBG: Recent Labs  Lab 03/14/18 2050  GLUCAP 76      Urine analysis:    Component Value Date/Time   COLORURINE YELLOW 10/25/2016 1649   APPEARANCEUR HAZY (A) 10/25/2016 1649   LABSPEC 1.013 10/25/2016 1649   PHURINE 6.0 10/25/2016 1649   GLUCOSEU NEGATIVE 10/25/2016 1649   HGBUR NEGATIVE 10/25/2016 1649   HGBUR negative 07/07/2007 0838   BILIRUBINUR NEGATIVE 10/25/2016 1649   KETONESUR NEGATIVE 10/25/2016 1649   PROTEINUR NEGATIVE 10/25/2016 1649   UROBILINOGEN 1.0 04/22/2015 0020   NITRITE NEGATIVE 10/25/2016 1649   LEUKOCYTESUR NEGATIVE 10/25/2016 1649       Cultures:    Component Value Date/Time   SDES URINE, CLEAN CATCH 03/05/2016 0138   SPECREQUEST Normal 03/05/2016 0138   CULT >=100,000 COLONIES/mL ESCHERICHIA COLI (A) 03/05/2016 0138   REPTSTATUS 03/07/2016 FINAL 03/05/2016 0138     Radiological Exams on Admission: Dg Chest 2 View  Result Date: 03/14/2018 CLINICAL DATA:  Overdose. EXAM: CHEST - 2 VIEW COMPARISON:  Radiograph of December 07, 2016. FINDINGS: The heart size and mediastinal contours are within normal limits. Both lungs are clear. No pneumothorax or  pleural effusion is noted. The visualized skeletal structures are unremarkable. IMPRESSION: No active cardiopulmonary disease. Electronically Signed   By: Marijo Conception, M.D.   On: 03/14/2018 21:17    Chart has been reviewed    Assessment/Plan   72 y.o. female with medical history significant of  GI bleeding and alcohol abuse, chronic pain, asthma, depression,  Admitted for opioid overdose requiring Narcan drip  Present on Admission: . Opioid overdose ([place]) admit to stepdown on Narcan drip . Alcohol abuse -monitor for signs of withdrawal . Asthma-  chronic stable make sure he has albuterol as needed . Hyperlipidemia- stable continue home medications . Tobacco abuse -  - Spoke about importance of quitting,   - order nicotine patch   - nursing tobacco cessation protocol  . 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 Hypokalemia - - will replace and repeat in AM,  check magnesium level and replace as needed  . AKI (acute kidney injury) ([place]) Will obtain urine electrolytes and rehydrate for creatinine  History of depression since unclear what patient took will hold off home meds for tonight she is also unsure what she is supposed to be taking.  Will probably benefit from behavioral health follow-up and regimen simplification   Other plan as per orders.  DVT prophylaxis:  SCD     Code Status:  FULL CODE     Family Communication:   Family not  at  Bedside    Disposition Plan:   To home once workup is complete and patient is stable                          Would benefit from PT/OT eval prior to DC   ordered                                               Consults called:  Behavioral health Admission status:  obs   Level of care      SDU        Lamanda Rudder 03/14/2018, 11:26 PM    [facility]  Pager [phone]   after 2 AM please page floor coverage PA If 7AM-7PM, please contact  the day team taking care  of the patient  [email or web address]  Password [email or web address]

## 2018-03-14 NOTE — ED Notes (Signed)
Bed: WA24 Expected date:  Expected time:  Means of arrival:  Comments: 

## 2018-03-14 NOTE — ED Notes (Signed)
Bed: WA23 Expected date:  Expected time:  Means of arrival:  Comments: 

## 2018-03-14 NOTE — ED Provider Notes (Signed)
[facility] DEPT Provider Note   CSN: [id number] Arrival date & time: 03/14/18  1759     History   Chief Complaint Chief Complaint  Patient presents with  . Drug Overdose    HPI [name] is a 72 y.o. female.  Patient brought in by EMS.  Patient resides at home with her husband.  Husband states he hit her oxycodone however cannot find the bottle and she was found supine on the bed respiratory rate of 8 oxygen sats of 83%.  EMS gave her 0.4 mg of Narcan patient immediately recovered post administration.  She received 200 cc bolus of normal saline in route.  Patient awake and upon arrival here denied any suicidal homicidal ideation.  Patient did admit to taking pain medicine but states she took it as she is supposed to.  Appears patient is followed by pain management and [place].  Patient also has a past history of alcohol abuse.  Patient was amatory upon arrival to the emergency department patient had no other complaints.     Past Medical History:  Diagnosis Date  . Alcohol abuse   . Alcohol abuse   . Allergy   . Arthritis    back-severe, hips, right knee  . Asthma   . GERD (gastroesophageal reflux disease)    occasional  . H/O measles   . H/O mumps   . Hypercholesteremia    under control  . Insomnia   . Psoriasis (a type of skin inflammation)   . Renal cell carcinoma 2012   left  . Seasonal allergies     Patient Active Problem List   Diagnosis Date Noted  . Hypokalemia 03/14/2018  . AKI (acute kidney injury) ([place]) 03/14/2018  . Opioid overdose ([place]) 03/14/2018  . Opioid abuse ([place]) 03/14/2018  . C. difficile colitis 12/13/2016  . Alcohol withdrawal with delirium in inpatient treatment ([place])   . Acute alcohol abuse, with unspecified complication ([place])   . Tachypnea   . UGI bleed 12/07/2016  . Acute upper GI bleed 10/25/2016  . Varices of esophagus determined by endoscopy ([place]) 10/25/2016  . Displaced oblique fracture of shaft  of left femur ([place]) 04/13/2016  . Acute upper GI bleeding 04/05/2016  . Upper GI bleed 04/05/2016  . Rotator cuff tendinitis 03/11/2016  . Alcohol withdrawal ([place]) 01/04/2016  . Acute blood loss anemia 01/04/2016  . Tobacco abuse 01/03/2016  . Diarrhea 01/03/2016  . Depression 01/03/2016  . Alcohol abuse   . Chronic back pain 08/18/2015  . Seasonal allergies 07/05/2015  . Insomnia 07/05/2015  . Arthritis 07/05/2015  . Psoriasis 07/05/2015  . Menopausal hot flushes 06/01/2015  . Dyspareunia 06/01/2015  . Multiple falls 05/03/2015  . Encounter for medication review 05/03/2015  . Hot flashes 05/03/2015  . Herniated lumbar intervertebral disc 10/12/2014  . Generalized abdominal pain 09/25/2013  . Anemia of chronic disease 09/11/2013  . Asthma 04/24/2007  . Hyperlipidemia 01/04/2007    Past Surgical History:  Procedure Laterality Date  . ABDOMINAL HYSTERECTOMY  40years ago  . BUNIONECTOMY  04/2011  . CHOLECYSTECTOMY  11/13/2011   Procedure: LAPAROSCOPIC CHOLECYSTECTOMY WITH INTRAOPERATIVE CHOLANGIOGRAM;  Surgeon: Judieth Keens, DO;  Location: [facility];  Service: General;  Laterality: N/A;  . COLONOSCOPY N/A 09/25/2013   Procedure: COLONOSCOPY;  Surgeon: Lear Ng, MD;  Location: [facility];  Service: Endoscopy;  Laterality: N/A;  . ESOPHAGOGASTRODUODENOSCOPY N/A 09/25/2013   Procedure: ESOPHAGOGASTRODUODENOSCOPY (EGD);  Surgeon: Lear Ng, MD;  [facility];  Service: Endoscopy;  Laterality: N/A;  . ESOPHAGOGASTRODUODENOSCOPY N/A 04/07/2016   Procedure: ESOPHAGOGASTRODUODENOSCOPY (EGD);  Surgeon: Milus Banister, MD;  Location: [facility];  Service: Endoscopy;  Laterality: N/A;  . ESOPHAGOGASTRODUODENOSCOPY N/A 10/26/2016   Procedure: ESOPHAGOGASTRODUODENOSCOPY (EGD);  Surgeon: Arta Silence, MD;  Location: [facility];  Service: Endoscopy;  Laterality: N/A;  . ESOPHAGOGASTRODUODENOSCOPY N/A 12/07/2016   Procedure: ESOPHAGOGASTRODUODENOSCOPY  (EGD);  Surgeon: Wonda Horner, MD;  Location: [facility];  Service: Endoscopy;  Laterality: N/A;  . ESOPHAGOGASTRODUODENOSCOPY (EGD) WITH PROPOFOL Left 01/03/2016   Procedure: ESOPHAGOGASTRODUODENOSCOPY (EGD) WITH PROPOFOL;  Surgeon: Arta Silence, MD;  [facility];  Service: Endoscopy;  Laterality: Left;  . FEMUR IM NAIL Left 04/15/2016   Procedure: INTRAMEDULLARY (IM) RETROGRADE FEMORAL NAILING;  Surgeon: Gaynelle Arabian, MD;  Location[facility];  Service: Orthopedics;  Laterality: Left;  . HERNIA REPAIR  11/173   supraumbilical repair  . KIDNEY SURGERY  12/2010   [facility]; partial nephrectomy  . LUMBAR LAMINECTOMY/DECOMPRESSION MICRODISCECTOMY Right 10/12/2014   Procedure: HEMI LAMINECTOMY MICRODISCECTOMY L5-S1 RIGHT (1 LEVEL);  Surgeon: Tobi Bastos, MD;  Location[facility];  Service: Orthopedics;  Laterality: Right;  . MENISECTOMY  2010   left knee  . TUBAL LIGATION  44 years ago     OB History    Gravida  3   Para  3   Term      Preterm      AB      Living  3     SAB      TAB      Ectopic      Multiple      Live Births               Home Medications    Prior to Admission medications   Medication Sig Start Date End Date Taking? Authorizing Provider  albuterol (PROVENTIL HFA;VENTOLIN HFA) 108 (90 Base) MCG/ACT inhaler Inhale 2 puffs into the lungs every 6 (six) hours as needed for wheezing or shortness of breath. 08/05/16   Golden Circle, FNP  cholestyramine (QUESTRAN) 4 g packet Take 1 packet (4 g total) by mouth 2 (two) times daily. 12/14/16   Charlynne Cousins, MD  cyclobenzaprine (FLEXERIL) 10 MG tablet Take 10 mg by mouth 3 (three) times daily as needed for muscle spasms.    [provider]  DULoxetine (CYMBALTA) 60 MG capsule Take 1 capsule (60 mg total) by mouth daily. 08/05/16   Golden Circle, FNP  famotidine (PEPCID) 20 MG tablet Take 20 mg by mouth 2 (two) times daily.    [provider]  ferrous sulfate 325 (65 FE) MG  tablet Take 1 tablet (325 mg total) by mouth 2 (two) times daily with a meal. 10/27/16   Janece Canterbury, MD  Fluticasone-Salmeterol (ADVAIR DISKUS) 500-50 MCG/DOSE AEPB INHALE 1 PUFF INTO THE LUNGS TWICE A DAY 08/01/16   Golden Circle, FNP  folic acid (FOLVITE) 1 MG tablet Take 1 tablet (1 mg total) by mouth daily. 04/18/16   Ghimire, Henreitta Leber, MD  HYDROcodone-acetaminophen (NORCO/VICODIN) 5-325 MG tablet Take 1 tablet by mouth every 6 (six) hours as needed. for pain 11/12/16   [provider]  Icosapent Ethyl (VASCEPA) 1 g CAPS Take 1 g by mouth 2 (two) times daily.    [provider]  LORazepam (ATIVAN) 1 MG tablet Take 1 tablet (1 mg total) by mouth 2 (two) times daily as needed for anxiety (Anxiety, shakiness. DO NOT USE WITH ALCOHOL.). 08/01/16  Golden Circle, FNP  magnesium oxide (MAG-OX) 400 MG tablet Take 400 mg by mouth daily as needed.    [provider]  methocarbamol (ROBAXIN) 500 MG tablet Take 750 mg by mouth every 4 (four) hours as needed for muscle spasms.    [provider]  montelukast (SINGULAIR) 10 MG tablet Take 1 tablet (10 mg total) by mouth at bedtime. 08/01/16   Golden Circle, FNP  Multiple Vitamin (MULTIVITAMIN WITH MINERALS) TABS tablet Take 1 tablet by mouth daily. 04/18/16   Ghimire, Henreitta Leber, MD  pantoprazole (PROTONIX) 40 MG tablet Take 1 tablet (40 mg total) by mouth daily. 10/27/16   Janece Canterbury, MD  PARoxetine (PAXIL) 10 MG tablet Take 0.5 tablets (5 mg total) by mouth daily. 08/01/16   Golden Circle, FNP  potassium chloride SA (K-DUR,KLOR-CON) 20 MEQ tablet Take 1 tablet (20 mEq total) by mouth daily. 08/05/16   Golden Circle, FNP  propranolol (INDERAL) 10 MG tablet Take 1 tablet (10 mg total) by mouth 2 (two) times daily. 10/27/16   Janece Canterbury, MD  simvastatin (ZOCOR) 40 MG tablet Take 1 tablet (40 mg total) by mouth every morning. 08/05/16   Golden Circle, FNP  sucralfate (CARAFATE) 1 g tablet Take 1 g  by mouth 4 (four) times daily. Take 1 tablet at bedtime on an empty stomach and before meals BID     [provider]  thiamine 100 MG tablet Take 1 tablet (100 mg total) by mouth daily. 04/18/16   Ghimire, Henreitta Leber, MD  zolpidem (AMBIEN) 5 MG tablet Take 1 tablet (5 mg total) by mouth at bedtime as needed. for sleep Patient not taking: Reported on 12/07/2016 08/01/16   Golden Circle, FNP    Family History Family History  Problem Relation Age of Onset  . Hyperlipidemia Mother   . Hypertension Mother     Social History Social History   Tobacco Use  . Smoking status: Current Some Day Smoker    Packs/day: 0.15    Years: 6.00    Pack years: 0.90    Types: Cigarettes  . Smokeless tobacco: Never Used  Substance Use Topics  . Alcohol use: Yes    Alcohol/week: 1.2 oz    Types: 1 Glasses of wine, 1 Shots of liquor per week    Comment: 2-3 glasses of wine a day  . Drug use: No     Allergies   Azithromycin and Morphine and related   Review of Systems Review of Systems  Constitutional: Negative for fever.  HENT: Negative for congestion.   Eyes: Negative for visual disturbance.  Respiratory: Negative for shortness of breath.   Cardiovascular: Negative for chest pain.  Gastrointestinal: Negative for abdominal pain.  Genitourinary: Negative for dysuria.  Musculoskeletal: Negative for myalgias.  Skin: Negative for rash.  Neurological: Negative for speech difficulty, weakness, numbness and headaches.  Hematological: Does not bruise/bleed easily.  Psychiatric/Behavioral: Positive for confusion.     Physical Exam Updated Vital Signs BP (!) 143/87   Pulse 94   Resp 15   SpO2 98%   Physical Exam  Constitutional: She is oriented to person, place, and time. She appears well-developed and well-nourished. No distress.  HENT:  Head: Normocephalic and atraumatic.  Mouth/Throat: Oropharynx is clear and moist.  Eyes: Pupils are equal, round, and reactive to light.  Conjunctivae and EOM are normal.  Neck: Neck supple.  Cardiovascular: Normal rate, regular rhythm and normal heart sounds.  Pulmonary/Chest: Effort normal and breath  sounds normal. No respiratory distress.  Abdominal: Soft. Bowel sounds are normal. There is no tenderness.  Neurological: She is alert and oriented to person, place, and time. No cranial nerve deficit or sensory deficit. She exhibits normal muscle tone. Coordination normal.  After Narcan she is awake and follows commands no specific complaints.  Skin: Skin is warm.  Nursing note and vitals reviewed.    ED Treatments / Results  Labs (all labs ordered are listed, but only abnormal results are displayed) Labs Reviewed  COMPREHENSIVE METABOLIC PANEL - Abnormal; Notable for the following components:      Result Value   Potassium 3.2 (*)    Creatinine, Ser 1.12 (*)    Calcium 8.5 (*)    Albumin 3.0 (*)    Alkaline Phosphatase 263 (*)    GFR calc non Af Amer 49 (*)    GFR calc Af Amer 57 (*)    All other components within normal limits  CBC WITH DIFFERENTIAL/PLATELET - Abnormal; Notable for the following components:   RBC 3.70 (*)    RDW 16.4 (*)    Eosinophils Absolute 0.9 (*)    All other components within normal limits  RAPID URINE DRUG SCREEN, HOSP PERFORMED - Abnormal; Notable for the following components:   Opiates POSITIVE (*)    All other components within normal limits  ACETAMINOPHEN LEVEL - Abnormal; Notable for the following components:   Acetaminophen (Tylenol), Serum <10 (*)    All other components within normal limits  MAGNESIUM - Abnormal; Notable for the following components:   Magnesium 1.3 (*)    All other components within normal limits  ETHANOL  PHOSPHORUS  SODIUM, URINE, RANDOM  CREATININE, URINE, RANDOM  PROTIME-INR  CBG MONITORING, ED    EKG EKG Interpretation  Date/Time:  Saturday Mar 14 2018 19:21:10 EDT Ventricular Rate:  99 PR Interval:    QRS Duration: 82 QT Interval:  371 QTC  Calculation: 477 R Axis:   68 Text Interpretation:  Sinus rhythm Probable left atrial enlargement Probable anteroseptal infarct, old Confirmed by Fredia Sorrow [phone]) on 03/14/2018 7:46:37 PM    ED ECG REPORT   Date: 03/14/2018  Rate: 99  Rhythm: normal sinus rhythm  QRS Axis: normal  Intervals: normal  ST/T Wave abnormalities: normal  Conduction Disutrbances:none  Narrative Interpretation:   Old EKG Reviewed: none available  I have personally reviewed the EKG tracing and agree with the computerized printout as noted.  Also evidence of probable anterior septal infarct age undetermined.  Radiology Dg Chest 2 View  Result Date: 03/14/2018 CLINICAL DATA:  Overdose. EXAM: CHEST - 2 VIEW COMPARISON:  Radiograph of December 07, 2016. FINDINGS: The heart size and mediastinal contours are within normal limits. Both lungs are clear. No pneumothorax or pleural effusion is noted. The visualized skeletal structures are unremarkable. IMPRESSION: No active cardiopulmonary disease. Electronically Signed   By: Marijo Conception, M.D.   On: 03/14/2018 21:17    Procedures Procedures (including critical care time)  CRITICAL CARE Performed by: Fredia Sorrow Total critical care time: 30 minutes Critical care time was exclusive of separately billable procedures and treating other patients. Critical care was necessary to treat or prevent imminent or life-threatening deterioration. Critical care was time spent personally by me on the following activities: development of treatment plan with patient and/or surrogate as well as nursing, discussions with consultants, evaluation of patient's response to treatment, examination of patient, obtaining history from patient or surrogate, ordering and performing treatments and interventions, ordering and review of  laboratory studies, ordering and review of radiographic studies, pulse oximetry and re-evaluation of patient's condition.  Medications Ordered in  ED Medications  0.9 %  sodium chloride infusion ( Intravenous New Bag/Given 03/14/18 1925)  naloxone HCl (NARCAN) 4 mg in dextrose 5 % 250 mL infusion (0.25 mg/hr Intravenous New Bag/Given 03/14/18 2316)  magnesium sulfate IVPB 2 g 50 mL (has no administration in time range)  naloxone [facility]) injection 0.4 mg (0.4 mg Intravenous Given 03/14/18 2004)  naloxone [facility]) injection 0.4 mg (0.4 mg Intravenous Given 03/14/18 2235)  potassium chloride SA (K-DUR,KLOR-CON) CR tablet 40 mEq (40 mEq Oral Given 03/14/18 2322)     Initial Impression / Assessment and Plan / ED Course  I have reviewed the triage vital signs and the nursing notes.  Pertinent labs & imaging results that were available during my care of the patient were reviewed by me and considered in my medical decision making (see chart for details).    Patient with opiate overdose.  Apparently oxycodone we are unable to confirm what medication is is it appears she is followed by pain management in the [facility] area.  Must be long-acting because patient has required 3 doses of Narcan and has been here for about 5 hours.  So started on Narcan drip.  Patient would receive 0.4 mg of Narcan would wake up would be awake for about an hour and a half and then would become somnolent again would receive Narcan and then wake up.  When this happened for the third time got third dose of 0.4 and then started Narcan drip.  Urine drug screen only positive for opiates.  Screen for Tylenol toxicity and other concerns negative.  Discussed with hospitalist they will need to admit due to the Narcan drip.  Family members did not come with patient.  Patient when she was awake denied that this was a suicide attempt.  Did admit to taking pain medicine.  EMS had reported that her husband had stated that he had hidden the pain medicine but she found it.  Were 90 been clear but even her pain medicine   Final Clinical Impressions(s) / ED Diagnoses   Final diagnoses:  Opiate  overdose, accidental or unintentional, initial encounter [facility])    ED Discharge Orders    None       Fredia Sorrow, MD 03/14/18 2342

## 2018-03-14 NOTE — ED Notes (Signed)
Bed: WA25 Expected date:  Expected time:  Means of arrival:  Comments: 

## 2018-03-14 NOTE — ED Triage Notes (Signed)
Per [facility] Pt resides at home. Per husband " I hid her oxycodone however can't find the bottle I hid". Per EMS Husband found pt supine on bed RR 8 02 sats 83%. Narcan 0.4mg  IVP Immediate recovery post administration. NS 200 cc bolus given in route. Pt denies SI/HI. Pt admits to taking oxycodone as scheduled however " no extra". At present pt alert and active and orient. Pt ambulatory upon arrival to ED. No other complaints.

## 2018-03-14 NOTE — ED Notes (Signed)
Bed: VE55 Expected date:  Expected time:  Means of arrival:  Comments: Accidental ingestion of pain meds

## 2018-03-15 ENCOUNTER — Other Ambulatory Visit: Payer: Self-pay

## 2018-03-15 DIAGNOSIS — Y92003 Bedroom of unspecified non-institutional (private) residence as the place of occurrence of the external cause: Secondary | ICD-10-CM | POA: Diagnosis not present

## 2018-03-15 DIAGNOSIS — T402X4D Poisoning by other opioids, undetermined, subsequent encounter: Secondary | ICD-10-CM

## 2018-03-15 DIAGNOSIS — Z720 Tobacco use: Secondary | ICD-10-CM | POA: Diagnosis not present

## 2018-03-15 DIAGNOSIS — F17210 Nicotine dependence, cigarettes, uncomplicated: Secondary | ICD-10-CM | POA: Diagnosis present

## 2018-03-15 DIAGNOSIS — Y900 Blood alcohol level of less than 20 mg/100 ml: Secondary | ICD-10-CM | POA: Diagnosis present

## 2018-03-15 DIAGNOSIS — G4700 Insomnia, unspecified: Secondary | ICD-10-CM | POA: Diagnosis present

## 2018-03-15 DIAGNOSIS — Z8349 Family history of other endocrine, nutritional and metabolic diseases: Secondary | ICD-10-CM | POA: Diagnosis not present

## 2018-03-15 DIAGNOSIS — Z9049 Acquired absence of other specified parts of digestive tract: Secondary | ICD-10-CM | POA: Diagnosis not present

## 2018-03-15 DIAGNOSIS — F329 Major depressive disorder, single episode, unspecified: Secondary | ICD-10-CM

## 2018-03-15 DIAGNOSIS — Z884 Allergy status to anesthetic agent status: Secondary | ICD-10-CM | POA: Diagnosis not present

## 2018-03-15 DIAGNOSIS — F1010 Alcohol abuse, uncomplicated: Secondary | ICD-10-CM | POA: Diagnosis present

## 2018-03-15 DIAGNOSIS — N179 Acute kidney failure, unspecified: Secondary | ICD-10-CM | POA: Diagnosis present

## 2018-03-15 DIAGNOSIS — G894 Chronic pain syndrome: Secondary | ICD-10-CM | POA: Diagnosis present

## 2018-03-15 DIAGNOSIS — R4587 Impulsiveness: Secondary | ICD-10-CM | POA: Diagnosis not present

## 2018-03-15 DIAGNOSIS — E876 Hypokalemia: Secondary | ICD-10-CM

## 2018-03-15 DIAGNOSIS — G8929 Other chronic pain: Secondary | ICD-10-CM

## 2018-03-15 DIAGNOSIS — T40601A Poisoning by unspecified narcotics, accidental (unintentional), initial encounter: Secondary | ICD-10-CM

## 2018-03-15 DIAGNOSIS — E785 Hyperlipidemia, unspecified: Secondary | ICD-10-CM

## 2018-03-15 DIAGNOSIS — E7800 Pure hypercholesterolemia, unspecified: Secondary | ICD-10-CM | POA: Diagnosis present

## 2018-03-15 DIAGNOSIS — T40601D Poisoning by unspecified narcotics, accidental (unintentional), subsequent encounter: Secondary | ICD-10-CM | POA: Diagnosis not present

## 2018-03-15 DIAGNOSIS — F0632 Mood disorder due to known physiological condition with major depressive-like episode: Secondary | ICD-10-CM

## 2018-03-15 DIAGNOSIS — Z8619 Personal history of other infectious and parasitic diseases: Secondary | ICD-10-CM | POA: Diagnosis not present

## 2018-03-15 DIAGNOSIS — Z885 Allergy status to narcotic agent status: Secondary | ICD-10-CM | POA: Diagnosis not present

## 2018-03-15 DIAGNOSIS — T402X1D Poisoning by other opioids, accidental (unintentional), subsequent encounter: Secondary | ICD-10-CM | POA: Diagnosis not present

## 2018-03-15 DIAGNOSIS — K219 Gastro-esophageal reflux disease without esophagitis: Secondary | ICD-10-CM | POA: Diagnosis present

## 2018-03-15 DIAGNOSIS — R030 Elevated blood-pressure reading, without diagnosis of hypertension: Secondary | ICD-10-CM | POA: Diagnosis present

## 2018-03-15 DIAGNOSIS — F4542 Pain disorder with related psychological factors: Secondary | ICD-10-CM | POA: Diagnosis not present

## 2018-03-15 DIAGNOSIS — M160 Bilateral primary osteoarthritis of hip: Secondary | ICD-10-CM | POA: Diagnosis present

## 2018-03-15 DIAGNOSIS — Z90710 Acquired absence of both cervix and uterus: Secondary | ICD-10-CM | POA: Diagnosis not present

## 2018-03-15 DIAGNOSIS — F419 Anxiety disorder, unspecified: Secondary | ICD-10-CM

## 2018-03-15 DIAGNOSIS — I8500 Esophageal varices without bleeding: Secondary | ICD-10-CM

## 2018-03-15 DIAGNOSIS — T402X1A Poisoning by other opioids, accidental (unintentional), initial encounter: Secondary | ICD-10-CM | POA: Diagnosis present

## 2018-03-15 DIAGNOSIS — J45909 Unspecified asthma, uncomplicated: Secondary | ICD-10-CM | POA: Diagnosis present

## 2018-03-15 DIAGNOSIS — Z8249 Family history of ischemic heart disease and other diseases of the circulatory system: Secondary | ICD-10-CM | POA: Diagnosis not present

## 2018-03-15 LAB — CBC
HEMATOCRIT: 38.7 % (ref 36.0–46.0)
Hemoglobin: 12.7 g/dL (ref 12.0–15.0)
MCH: 32.6 pg (ref 26.0–34.0)
MCHC: 32.8 g/dL (ref 30.0–36.0)
MCV: 99.2 fL (ref 78.0–100.0)
PLATELETS: 241 10*3/uL (ref 150–400)
RBC: 3.9 MIL/uL (ref 3.87–5.11)
RDW: 16.7 % — ABNORMAL HIGH (ref 11.5–15.5)
WBC: 7 10*3/uL (ref 4.0–10.5)

## 2018-03-15 LAB — MAGNESIUM: Magnesium: 1.9 mg/dL (ref 1.7–2.4)

## 2018-03-15 LAB — TSH: TSH: 0.288 u[IU]/mL — AB (ref 0.350–4.500)

## 2018-03-15 LAB — COMPREHENSIVE METABOLIC PANEL
ALT: 15 U/L (ref 14–54)
ANION GAP: 11 (ref 5–15)
AST: 33 U/L (ref 15–41)
Albumin: 2.8 g/dL — ABNORMAL LOW (ref 3.5–5.0)
Alkaline Phosphatase: 250 U/L — ABNORMAL HIGH (ref 38–126)
BUN: 9 mg/dL (ref 6–20)
CHLORIDE: 109 mmol/L (ref 101–111)
CO2: 22 mmol/L (ref 22–32)
Calcium: 8.5 mg/dL — ABNORMAL LOW (ref 8.9–10.3)
Creatinine, Ser: 0.81 mg/dL (ref 0.44–1.00)
Glucose, Bld: 82 mg/dL (ref 65–99)
POTASSIUM: 4.6 mmol/L (ref 3.5–5.1)
Sodium: 142 mmol/L (ref 135–145)
Total Bilirubin: 0.7 mg/dL (ref 0.3–1.2)
Total Protein: 6.3 g/dL — ABNORMAL LOW (ref 6.5–8.1)

## 2018-03-15 LAB — MRSA PCR SCREENING: MRSA BY PCR: NEGATIVE

## 2018-03-15 LAB — PHOSPHORUS: PHOSPHORUS: 4.1 mg/dL (ref 2.5–4.6)

## 2018-03-15 MED ORDER — PROPRANOLOL HCL 20 MG PO TABS
10.0000 mg | ORAL_TABLET | Freq: Two times a day (BID) | ORAL | Status: DC
  Administered 2018-03-15 – 2018-03-17 (×6): 10 mg via ORAL
  Filled 2018-03-15 (×6): qty 1

## 2018-03-15 MED ORDER — POLYETHYLENE GLYCOL 3350 17 G PO PACK: 17.0000 g | PACK | Freq: Every day | ORAL | Status: DC | PRN

## 2018-03-15 MED ORDER — VITAMIN B-1 100 MG PO TABS
100.0000 mg | ORAL_TABLET | Freq: Every day | ORAL | Status: DC
  Administered 2018-03-15 – 2018-03-17 (×3): 100 mg via ORAL
  Filled 2018-03-15 (×4): qty 1

## 2018-03-15 MED ORDER — LORAZEPAM 2 MG/ML IJ SOLN
2.0000 mg | INTRAMUSCULAR | Status: DC | PRN
  Administered 2018-03-15: 2 mg via INTRAVENOUS
  Filled 2018-03-15: qty 1

## 2018-03-15 MED ORDER — ENOXAPARIN SODIUM 30 MG/0.3ML ~~LOC~~ SOLN
30.0000 mg | SUBCUTANEOUS | Status: DC
  Administered 2018-03-15 – 2018-03-16 (×2): 30 mg via SUBCUTANEOUS
  Filled 2018-03-15 (×2): qty 0.3

## 2018-03-15 MED ORDER — SIMVASTATIN 40 MG PO TABS
40.0000 mg | ORAL_TABLET | Freq: Every day | ORAL | Status: DC
  Administered 2018-03-15 – 2018-03-16 (×2): 40 mg via ORAL
  Filled 2018-03-15 (×2): qty 1

## 2018-03-15 MED ORDER — ONDANSETRON HCL 4 MG PO TABS: 4.0000 mg | ORAL_TABLET | Freq: Four times a day (QID) | ORAL | Status: DC | PRN

## 2018-03-15 MED ORDER — ACETAMINOPHEN 650 MG RE SUPP: 650.0000 mg | Freq: Four times a day (QID) | RECTAL | Status: DC | PRN

## 2018-03-15 MED ORDER — ONDANSETRON HCL 4 MG/2ML IJ SOLN: 4.0000 mg | Freq: Four times a day (QID) | INTRAMUSCULAR | Status: DC | PRN

## 2018-03-15 MED ORDER — TRAZODONE HCL 50 MG PO TABS
50.0000 mg | ORAL_TABLET | Freq: Every day | ORAL | Status: DC
  Administered 2018-03-15 – 2018-03-16 (×2): 50 mg via ORAL
  Filled 2018-03-15 (×2): qty 1

## 2018-03-15 MED ORDER — BISACODYL 10 MG RE SUPP: 10.0000 mg | Freq: Every day | RECTAL | Status: DC | PRN

## 2018-03-15 MED ORDER — ACETAMINOPHEN 325 MG PO TABS: 650.0000 mg | ORAL_TABLET | Freq: Four times a day (QID) | ORAL | Status: DC | PRN

## 2018-03-15 MED ORDER — POTASSIUM CHLORIDE CRYS ER 20 MEQ PO TBCR: 20.0000 meq | EXTENDED_RELEASE_TABLET | Freq: Every day | ORAL | Status: DC

## 2018-03-15 MED ORDER — ALBUTEROL SULFATE (2.5 MG/3ML) 0.083% IN NEBU: 2.5000 mg | INHALATION_SOLUTION | RESPIRATORY_TRACT | Status: DC | PRN

## 2018-03-15 NOTE — Progress Notes (Signed)
PROGRESS NOTE    [name]  MWU:[id number] DOB: February 08, 1946 DOA: 03/14/2018 PCP: Damaris Hippo, MD   Brief Narrative:  Patient with history of prior GI bleed, alcohol abuse, chronic pain, asthma, depression presented to the ED with overdose on opioid medications.  Patient had to be given Narcan and subsequently placed on a Narcan drip and transferred to the stepdown unit.   Assessment & Plan:   Principal Problem:   Opioid overdose ([place]) Active Problems:   Hyperlipidemia   Asthma   Alcohol abuse   Tobacco abuse   Depression   Varices of esophagus determined by endoscopy ([place])   Hypokalemia   AKI (acute kidney injury) ([place])   Opioid abuse ([place])  #1 opioid overdose Patient noted to have overdosed on opioid pain medications however per admitting physician patient was frequently changing her story about the quantity and medications she took.  Alcohol level was less than 10.  UDS was positive for opiates.  Patient was given Narcan and subsequently placed on a Narcan drip.  Narcan drip discontinued earlier this morning at 3:32 AM.  Patient drowsy however arousable to voice and sternal rub.  Protecting airway.  Monitor for now.  If no significant improvement over the next few hours and if patient does not become more alert may need to be given another dose of IV Narcan.  Check a chest x-ray.  Psych consultation pending.  2.  Hx alcohol abuse Alcohol level was less than 10 on admission.  Patient currently sleeping.  Monitor for signs of alcohol withdrawal.  Continue the Ativan withdrawal protocol.  3.  Asthma Stable.  Albuterol nebs as needed.  4.  Hyperlipidemia Continue statin.  5.  Hypokalemia/hypomagnesemia Potassium repleted.  Discontinue daily supplemental potassium.  Magnesium repleted.  6.  Tobacco abuse Nicotine patch.  7.  Depression Psych consultation pending for management of patient's depression as patient unsure what medication she was taking.  8.  History of  esophageal varices Currently stable.  No episodes of hematemesis.  Hemoglobin stable at 12.7.  Continue propranolol.  9.  Acute kidney injury Improved with hydration.  10.   DVT prophylaxis: Lovenox Code Status: Full Family Communication: No family at bedside. Disposition Plan: To be determined   Consultants:   Psychiatry pending  Procedures:   Chest x-ray 03/14/2018  Antimicrobials:   None   Subjective: Patient drowsy however easily arousable to voice and sternal rub, but drifts back off to sleep.  Denies any shortness of breath.  Objective: Vitals:   03/15/18 0500 03/15/18 0600 03/15/18 0700 03/15/18 0800  BP: 117/66 123/74 (!) 146/87 133/81  Pulse:    67  Resp: 12 12 15 12   Temp:   98.4 F (36.9 C)   TempSrc:   Oral   SpO2:    100%    Intake/Output Summary (Last 24 hours) at 03/15/2018 0948 Last data filed at 03/15/2018 0800 Gross per 24 hour  Intake 1060.31 ml  Output 850 ml  Net 210.31 ml   There were no vitals filed for this visit.  Examination:  General exam: Drowsy Respiratory system: Clear to auscultation. Respiratory effort normal. Cardiovascular system: S1 & S2 heard, RRR. No JVD, murmurs, rubs, gallops or clicks. No pedal edema. Gastrointestinal system: Abdomen is nondistended, soft and nontender. No organomegaly or masses felt. Normal bowel sounds heard. Central nervous system: Drowsy however arousable.  Moving extremities spontaneously. No focal neurological deficits. Extremities: Symmetric 5 x 5 power. Skin: No rashes, lesions or ulcers Psychiatry: Judgement and insight  able to assess at this time as patient is drowsy.    Data Reviewed: I have personally reviewed following labs and imaging studies  CBC: Recent Labs  Lab 03/14/18 1832 03/15/18 0343  WBC 8.0 7.0  NEUTROABS 3.7  --   HGB 12.2 12.7  HCT 36.8 38.7  MCV 99.5 99.2  PLT 364 937   Basic Metabolic Panel: Recent Labs  Lab 03/14/18 1832 03/15/18 0343  NA 140 142  K  3.2* 4.6  CL 104 109  CO2 24 22  GLUCOSE 79 82  BUN 12 9  CREATININE 1.12* 0.81  CALCIUM 8.5* 8.5*  MG 1.3* 1.9  PHOS 4.2 4.1   GFR: CrCl cannot be calculated (Unknown ideal weight.). Liver Function Tests: Recent Labs  Lab 03/14/18 1832 03/15/18 0343  AST 29 33  ALT 15 15  ALKPHOS 263* 250*  BILITOT 0.5 0.7  PROT 6.8 6.3*  ALBUMIN 3.0* 2.8*   No results for input(s): LIPASE, AMYLASE in the last 168 hours. No results for input(s): AMMONIA in the last 168 hours. Coagulation Profile: Recent Labs  Lab 03/14/18 1832  INR 1.12   Cardiac Enzymes: No results for input(s): CKTOTAL, CKMB, CKMBINDEX, TROPONINI in the last 168 hours. BNP (last 3 results) No results for input(s): PROBNP in the last 8760 hours. HbA1C: No results for input(s): HGBA1C in the last 72 hours. CBG: Recent Labs  Lab 03/14/18 2050  GLUCAP 76   Lipid Profile: No results for input(s): CHOL, HDL, LDLCALC, TRIG, CHOLHDL, LDLDIRECT in the last 72 hours. Thyroid Function Tests: Recent Labs    03/15/18 0343  TSH 0.288*   Anemia Panel: No results for input(s): VITAMINB12, FOLATE, FERRITIN, TIBC, IRON, RETICCTPCT in the last 72 hours. Sepsis Labs: No results for input(s): PROCALCITON, LATICACIDVEN in the last 168 hours.  Recent Results (from the past 240 hour(s))  MRSA PCR Screening     Status: None   Collection Time: 03/15/18 12:22 AM  Result Value Ref Range Status   MRSA by PCR NEGATIVE NEGATIVE Final    Comment:        The GeneXpert MRSA Assay (FDA approved for NASAL specimens only), is one component of a comprehensive MRSA colonization surveillance program. It is not intended to diagnose MRSA infection nor to guide or monitor treatment for MRSA infections. Performed at [facility], [place] [address] [place], [place] [address]          Radiology Studies: Dg Chest 2 View  Result Date: 03/14/2018 CLINICAL DATA:  Overdose. EXAM: CHEST - 2 VIEW COMPARISON:   Radiograph of December 07, 2016. FINDINGS: The heart size and mediastinal contours are within normal limits. Both lungs are clear. No pneumothorax or pleural effusion is noted. The visualized skeletal structures are unremarkable. IMPRESSION: No active cardiopulmonary disease. Electronically Signed   By: Marijo Conception, M.D.   On: 03/14/2018 21:17        Scheduled Meds: . enoxaparin (LOVENOX) injection  30 mg Subcutaneous Q24H  . propranolol  10 mg Oral BID  . simvastatin  40 mg Oral q1800  . thiamine  100 mg Oral Daily   Continuous Infusions: . sodium chloride 75 mL/hr at 03/15/18 0800  . naLOXone [facility]) adult infusion for OVERDOSE Stopped (03/15/18 0332)     LOS: 0 days    Time spent: 40 minutes    Irine Seal, MD [facility] Pager [phone]  If 7PM-7AM, please contact night-coverage [email or web address] Password [facility] 03/15/2018, 9:48 AM

## 2018-03-15 NOTE — Evaluation (Signed)
Physical Therapy Evaluation Patient Details Name: [name] MRN: [id number] DOB: May 11, 1946 Today's Date: 03/15/2018   History of Present Illness  72 y.o. female with history of prior GI bleed, alcohol abuse, chronic pain, asthma, depression presented to the ED with overdose on opioid medications.    Clinical Impression  Pt admitted with above diagnosis. Pt currently with functional limitations due to the deficits listed below (see PT Problem List). Pt ambulated 62' with RW and min/guard assist for safety. Pt oriented to self, location but not to date (stated it is February [birth year], 1917). Good progress expected.  Pt will benefit from skilled PT to increase their independence and safety with mobility to allow discharge to the venue listed below.       Follow Up Recommendations Home health PT; supervision for mobility    Equipment Recommendations  None recommended by PT    Recommendations for Other Services       Precautions / Restrictions Precautions Precautions: Fall Precaution Comments: pt denied h/o falls Restrictions Weight Bearing Restrictions: No      Mobility  Bed Mobility Overal bed mobility: Needs Assistance Bed Mobility: Supine to Sit     Supine to sit: Min assist     General bed mobility comments: increased time, min A to raise trunk and pivot hips to edge of bed  Transfers Overall transfer level: Needs assistance Equipment used: Rolling walker (2 wheeled) Transfers: Sit to/from Stand Sit to Stand: Min assist         General transfer comment: VCs hand placement, min A to rise  Ambulation/Gait Ambulation/Gait assistance: Min guard Ambulation Distance (Feet): 90 Feet Assistive device: Rolling walker (2 wheeled) Gait Pattern/deviations: Step-through pattern Gait velocity: decr   General Gait Details: steady, no loss of balance, VSS  Stairs            Wheelchair Mobility    Modified Rankin (Stroke Patients Only)       Balance Overall  balance assessment: Needs assistance   Sitting balance-Leahy Scale: Good       Standing balance-Leahy Scale: Fair                               Pertinent Vitals/Pain Pain Assessment: No/denies pain    Home Living Family/patient expects to be discharged to:: Private residence Living Arrangements: Spouse/significant other Available Help at Discharge: Family Type of Home: House Home Access: Ramped entrance     Home Layout: One level Home Equipment: Environmental consultant - 4 wheels;Cane - quad      Prior Function Level of Independence: Independent with assistive device(s)         Comments: uses cane     Hand Dominance   Dominant Hand: Right    Extremity/Trunk Assessment   Upper Extremity Assessment Upper Extremity Assessment: Overall WFL for tasks assessed    Lower Extremity Assessment Lower Extremity Assessment: Overall WFL for tasks assessed    Cervical / Trunk Assessment Cervical / Trunk Assessment: Kyphotic  Communication   Communication: No difficulties  Cognition Arousal/Alertness: Awake/alert(initially lethargic but became more alert with activity) Behavior During Therapy: Flat affect Overall Cognitive Status: No family/caregiver present to determine baseline cognitive functioning                                 General Comments: pt oriented to location and self, stated the date is "February [birth year], 1917", followed  1 step commands consistently      General Comments      Exercises     Assessment/Plan    PT Assessment Patient needs continued PT services  PT Problem List Decreased mobility;Decreased balance       PT Treatment Interventions Gait training;DME instruction;Functional mobility training;Therapeutic activities;Therapeutic exercise    PT Goals (Current goals can be found in the Care Plan section)  Acute Rehab PT Goals Patient Stated Goal: none stated Time For Goal Achievement: 03/29/18 Potential to Achieve Goals: Good     Frequency Min 3X/week   Barriers to discharge        Co-evaluation               AM-PAC PT "6 Clicks" Daily Activity  Outcome Measure Difficulty turning over in bed (including adjusting bedclothes, sheets and blankets)?: A Little Difficulty moving from lying on back to sitting on the side of the bed? : Unable Difficulty sitting down on and standing up from a chair with arms (e.g., wheelchair, bedside commode, etc,.)?: Unable Help needed moving to and from a bed to chair (including a wheelchair)?: A Little Help needed walking in hospital room?: A Little Help needed climbing 3-5 steps with a railing? : A Lot 6 Click Score: 13    End of Session Equipment Utilized During Treatment: Gait belt Activity Tolerance: Patient tolerated treatment well Patient left: in chair;with call bell/phone within reach;with chair alarm set Nurse Communication: Mobility status PT Visit Diagnosis: Difficulty in walking, not elsewhere classified (R26.2);Unsteadiness on feet (R26.81)    Time: 2800-3491 PT Time Calculation (min) (ACUTE ONLY): 20 min   Charges:   PT Evaluation $PT Eval Low Complexity: 1 Low     PT G Codes:          Philomena Doheny 03/15/2018, 12:31 PM [phone]

## 2018-03-15 NOTE — Consult Note (Signed)
[name] Consult   Reason for Consult: Depression, Opioid abuse Referring Physician:  Dr. Grandville Silos Patient Identification: [name] MRN:  [id number] Principal Diagnosis: Opioid overdose [facility]) Diagnosis:   Patient Active Problem List   Diagnosis Date Noted  . Opiate overdose ([place]) [T40.601A]   . Hypokalemia [E87.6] 03/14/2018  . AKI (acute kidney injury) ([place]) [N17.9] 03/14/2018  . Opioid overdose ([place]) [T40.2X1A] 03/14/2018  . Opioid abuse ([place]) [F11.10] 03/14/2018  . C. difficile colitis [A04.72] 12/13/2016  . Alcohol withdrawal with delirium in inpatient treatment ([place]) [F10.231]   . Acute alcohol abuse, with unspecified complication ([place]) [P54.656]   . Tachypnea [R06.82]   . UGI bleed [K92.2] 12/07/2016  . Acute upper GI bleed [K92.2] 10/25/2016  . 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 Displaced oblique fracture of shaft of left femur ([place]) [S72.332A] 04/13/2016  . Acute upper GI bleeding [K92.2] 04/05/2016  . Upper GI bleed [K92.2] 04/05/2016  . Rotator cuff tendinitis [M75.80] 03/11/2016  . Alcohol withdrawal ([place]) [F10.239] 01/04/2016  . Acute blood loss anemia [D62] 01/04/2016  . Tobacco abuse [Z72.0] 01/03/2016  . Diarrhea [R19.7] 01/03/2016  . Depression [F32.9] 01/03/2016  . Alcohol abuse [F10.10]   . Chronic back pain [M54.9, G89.29] 08/18/2015  . Seasonal allergies [J30.2] 07/05/2015  . Insomnia [G47.00] 07/05/2015  . Arthritis [M19.90] 07/05/2015  . Psoriasis [L40.9] 07/05/2015  . Menopausal hot flushes [N95.1] 06/01/2015  . Dyspareunia [IMO0002] 06/01/2015  . Multiple falls [R29.6] 05/03/2015  . Encounter for medication review [Z79.899] 05/03/2015  . Hot flashes [R23.2] 05/03/2015  . Herniated lumbar intervertebral disc [M51.26] 10/12/2014  . Generalized abdominal pain [R10.84] 09/25/2013  . Anemia of chronic disease [D63.8] 09/11/2013  . Asthma [J45.909] 04/24/2007  . Hyperlipidemia [E78.5] 01/04/2007     Total Time spent with patient: 45 minutes  Subjective:   [name] is a 72 y.o. female patient admitted after opioid abuse .  HPI: Patient who is a poor historian but chart review revealed history of prior GI bleed, alcohol abuse, chronic pain, asthma and Depression. She was [facility] to [facility] after she allegedly  overdosed on opioid.Patient had to be given Narcan and subsequently placed on a Narcan drip and transferred to the stepdown unit. Patient was not able to articulate the circumstances that lead to her being brought to the hospital. However, she denies suicidal ideation, delusions and psychosis. However, patient reports occasional difficulty sleeping, anxiety, depression due to chronic pain. Patient reports that she never took any medications for depression or anxiety.   Past Psychiatric History: as above  Risk to Self: Is patient at risk for suicide?: No Risk to Others:   Prior Inpatient Therapy:   Prior Outpatient Therapy:    Past Medical History:  Past Medical History:  Diagnosis Date  . Alcohol abuse   . Alcohol abuse   . Allergy   . Arthritis    back-severe, hips, right knee  . Asthma   . GERD (gastroesophageal reflux disease)    occasional  . H/O measles   . H/O mumps   . Hypercholesteremia    under control  . Insomnia   . Psoriasis (a type of skin inflammation)   . Renal cell carcinoma 2012   left  . Seasonal allergies     Past Surgical History:  Procedure Laterality Date  . ABDOMINAL HYSTERECTOMY  40years ago  . BUNIONECTOMY  04/2011  . CHOLECYSTECTOMY  11/13/2011   Procedure: LAPAROSCOPIC CHOLECYSTECTOMY WITH INTRAOPERATIVE CHOLANGIOGRAM;  Surgeon: Aaron Edelman  Billy Fischer, DO;  Location: [facility];  Service: General;  Laterality: N/A;  . COLONOSCOPY N/A 09/25/2013   Procedure: COLONOSCOPY;  Surgeon: Lear Ng, MD;  [facility];  Service: Endoscopy;  Laterality: N/A;  . ESOPHAGOGASTRODUODENOSCOPY N/A 09/25/2013   Procedure:  ESOPHAGOGASTRODUODENOSCOPY (EGD);  Surgeon: Lear Ng, MD;  Location: [facility];  Service: Endoscopy;  Laterality: N/A;  . ESOPHAGOGASTRODUODENOSCOPY N/A 04/07/2016   Procedure: ESOPHAGOGASTRODUODENOSCOPY (EGD);  Surgeon: Milus Banister, MD;  Location: [facility];  Service: Endoscopy;  Laterality: N/A;  . ESOPHAGOGASTRODUODENOSCOPY N/A 10/26/2016   Procedure: ESOPHAGOGASTRODUODENOSCOPY (EGD);  Surgeon: Arta Silence, MD;  Location: [facility];  Service: Endoscopy;  Laterality: N/A;  . ESOPHAGOGASTRODUODENOSCOPY N/A 12/07/2016   Procedure: ESOPHAGOGASTRODUODENOSCOPY (EGD);  Surgeon: Wonda Horner, MD;  Location: [facility];  Service: Endoscopy;  Laterality: N/A;  . ESOPHAGOGASTRODUODENOSCOPY (EGD) WITH PROPOFOL Left 01/03/2016   Procedure: ESOPHAGOGASTRODUODENOSCOPY (EGD) WITH PROPOFOL;  Surgeon: [place] Silence, MD;  [facility];  Service: Endoscopy;  Laterality: Left;  . FEMUR IM NAIL Left 04/15/2016   Procedure: INTRAMEDULLARY (IM) RETROGRADE FEMORAL NAILING;  Surgeon: Gaynelle Arabian, MD;  [facility];  Service: Orthopedics;  Laterality: Left;  . HERNIA REPAIR  01/8181   supraumbilical repair  . KIDNEY SURGERY  12/2010   [facility]; partial nephrectomy  . LUMBAR LAMINECTOMY/DECOMPRESSION MICRODISCECTOMY Right 10/12/2014   Procedure: HEMI LAMINECTOMY MICRODISCECTOMY L5-S1 RIGHT (1 LEVEL);  Surgeon: Tobi Bastos, MD;  [facility];  Service: Orthopedics;  Laterality: Right;  . MENISECTOMY  2010   left knee  . TUBAL LIGATION  44 years ago   Family History:  Family History  Problem Relation Age of Onset  . Hyperlipidemia Mother   . Hypertension Mother    Family Psychiatric  History:  Social History:  Social History   Substance and Sexual Activity  Alcohol Use Yes  . Alcohol/week: 1.2 oz  . Types: 1 Glasses of wine, 1 Shots of liquor per week   Comment: 2-3 glasses of wine a day     Social History   Substance and Sexual Activity  Drug Use No     Social History   Socioeconomic History  . Marital status: Divorced    Spouse name: Not on file  . Number of children: 3  . Years of education: 62  . Highest education level: Not on file  Occupational History  . Occupation: Retired  Scientific laboratory technician  . Financial resource strain: Not on file  . Food insecurity:    Worry: Not on file    Inability: Not on file  . Transportation needs:    Medical: Not on file    Non-medical: Not on file  Tobacco Use  . Smoking status: Current Some Day Smoker    Packs/day: 0.15    Years: 6.00    Pack years: 0.90    Types: Cigarettes  . Smokeless tobacco: Never Used  Substance and Sexual Activity  . Alcohol use: Yes    Alcohol/week: 1.2 oz    Types: 1 Glasses of wine, 1 Shots of liquor per week    Comment: 2-3 glasses of wine a day  . Drug use: No  . Sexual activity: Yes  Lifestyle  . Physical activity:    Days per week: Not on file    Minutes per session: Not on file  . Stress: Not on file  Relationships  . Social connections:    Talks on phone: Not on file    Gets together: Not on file  Attends religious service: Not on file    Active member of club or organization: Not on file    Attends meetings of clubs or organizations: Not on file    Relationship status: Not on file  Other Topics Concern  . Not on file  Social History Narrative   Fun: play cards, play with her grandchildren, cooking   Denies religious beliefs effecting health care.    Additional Social History:    Allergies:   Allergies  Allergen Reactions  . Azithromycin Itching and Swelling  . Morphine And Related Itching    Labs:  Results for orders placed or performed during the hospital encounter of 03/14/18 (from the past 48 hour(s))  Comprehensive metabolic panel     Status: Abnormal   Collection Time: 03/14/18  6:32 PM  Result Value Ref Range   Sodium 140 135 - 145 mmol/L   Potassium 3.2 (L) 3.5 - 5.1 mmol/L   Chloride 104 101 - 111 mmol/L   CO2 24 22 - 32  mmol/L   Glucose, Bld 79 65 - 99 mg/dL   BUN 12 6 - 20 mg/dL   Creatinine, Ser 1.12 (H) 0.44 - 1.00 mg/dL   Calcium 8.5 (L) 8.9 - 10.3 mg/dL   Total Protein 6.8 6.5 - 8.1 g/dL   Albumin 3.0 (L) 3.5 - 5.0 g/dL   AST 29 15 - 41 U/L   ALT 15 14 - 54 U/L   Alkaline Phosphatase 263 (H) 38 - 126 U/L   Total Bilirubin 0.5 0.3 - 1.2 mg/dL   GFR calc non Af Amer 49 (L) >60 mL/min   GFR calc Af Amer 57 (L) >60 mL/min    Comment: (NOTE) The eGFR has been calculated using the CKD EPI equation. This calculation has not been validated in all clinical situations. eGFR's persistently <60 mL/min signify possible Chronic Kidney Disease.    Anion gap 12 5 - 15    Comment: Performed at [facility], [place] [address] [place], [place] [address]  CBC with Differential/Platelet     Status: Abnormal   Collection Time: 03/14/18  6:32 PM  Result Value Ref Range   WBC 8.0 4.0 - 10.5 K/uL   RBC 3.70 (L) 3.87 - 5.11 MIL/uL   Hemoglobin 12.2 12.0 - 15.0 g/dL   HCT 36.8 36.0 - 46.0 %   MCV 99.5 78.0 - 100.0 fL   MCH 33.0 26.0 - 34.0 pg   MCHC 33.2 30.0 - 36.0 g/dL   RDW 16.4 (H) 11.5 - 15.5 %   Platelets 364 150 - 400 K/uL   Neutrophils Relative % 46 %   Neutro Abs 3.7 1.7 - 7.7 K/uL   Lymphocytes Relative 33 %   Lymphs Abs 2.6 0.7 - 4.0 K/uL   Monocytes Relative 9 %   Monocytes Absolute 0.8 0.1 - 1.0 K/uL   Eosinophils Relative 12 %   Eosinophils Absolute 0.9 (H) 0.0 - 0.7 K/uL   Basophils Relative 0 %   Basophils Absolute 0.0 0.0 - 0.1 K/uL    Comment: Performed at [facility], [place] [address], [place], [place] [address]  Ethanol     Status: None   Collection Time: 03/14/18  6:32 PM  Result Value Ref Range   Alcohol, Ethyl (B) <10 <10 mg/dL    Comment:        LOWEST DETECTABLE LIMIT FOR SERUM ALCOHOL IS 10 mg/dL FOR MEDICAL PURPOSES ONLY Performed at [place] [address]  [place], [place] [address]   Rapid urine drug screen  (hospital performed)     Status: Abnormal   Collection Time: 03/14/18  6:32 PM  Result Value Ref Range   Opiates POSITIVE (A) NONE DETECTED   Cocaine NONE DETECTED NONE DETECTED   Benzodiazepines NONE DETECTED NONE DETECTED   Amphetamines NONE DETECTED NONE DETECTED   Tetrahydrocannabinol NONE DETECTED NONE DETECTED   Barbiturates NONE DETECTED NONE DETECTED    Comment: (NOTE) DRUG SCREEN FOR MEDICAL PURPOSES ONLY.  IF CONFIRMATION IS NEEDED FOR ANY PURPOSE, NOTIFY LAB WITHIN 5 DAYS. LOWEST DETECTABLE LIMITS FOR URINE DRUG SCREEN Drug Class                     Cutoff (ng/mL) Amphetamine and metabolites    1000 Barbiturate and metabolites    200 Benzodiazepine                 338 Tricyclics and metabolites     300 Opiates and metabolites        300 Cocaine and metabolites        300 THC                            50 Performed at [facility], [place] [address], [place], [place] [address]   Acetaminophen level     Status: Abnormal   Collection Time: 03/14/18  6:32 PM  Result Value Ref Range   Acetaminophen (Tylenol), Serum <10 (L) 10 - 30 ug/mL    Comment:        THERAPEUTIC CONCENTRATIONS VARY SIGNIFICANTLY. A RANGE OF 10-30 ug/mL MAY BE AN EFFECTIVE CONCENTRATION FOR MANY PATIENTS. HOWEVER, SOME ARE BEST TREATED AT CONCENTRATIONS OUTSIDE THIS RANGE. ACETAMINOPHEN CONCENTRATIONS >150 ug/mL AT 4 HOURS AFTER INGESTION AND >50 ug/mL AT 12 HOURS AFTER INGESTION ARE OFTEN ASSOCIATED WITH TOXIC REACTIONS. Performed at [facility], [place] [address] [place], [place] [address]   Magnesium     Status: Abnormal   Collection Time: 03/14/18  6:32 PM  Result Value Ref Range   Magnesium 1.3 (L) 1.7 - 2.4 mg/dL    Comment: Performed at [facility], [place] [address], [place], [place] [address]  Phosphorus     Status: None   Collection Time: 03/14/18  6:32 PM  Result Value Ref Range   Phosphorus 4.2 2.5 - 4.6 mg/dL    Comment:  Performed at [facility], [place] [address], [place], [place] [address]  Sodium, urine, random     Status: None   Collection Time: 03/14/18  6:32 PM  Result Value Ref Range   Sodium, Ur 20 mmol/L    Comment: Performed at [facility], [place] [address] [place], [place] [address]  Creatinine, urine, random     Status: None   Collection Time: 03/14/18  6:32 PM  Result Value Ref Range   Creatinine, Urine 118.34 mg/dL    Comment: Performed at [facility], [place] [address] [place], [place] [address]  Protime-INR     Status: None   Collection Time: 03/14/18  6:32 PM  Result Value Ref Range   Prothrombin Time 14.3 11.4 - 15.2 seconds   INR 1.12     Comment: Performed at [facility], [place] [address], [place],  [address]  CBG monitoring, ED     Status: None   Collection Time: 03/14/18  8:50 PM  Result Value  Ref Range   Glucose-Capillary 76 65 - 99 mg/dL  MRSA PCR Screening     Status: None   Collection Time: 03/15/18 12:22 AM  Result Value Ref Range   MRSA by PCR NEGATIVE NEGATIVE    Comment:        The GeneXpert MRSA Assay (FDA approved for NASAL specimens only), is one component of a comprehensive MRSA colonization surveillance program. It is not intended to diagnose MRSA infection nor to guide or monitor treatment for MRSA infections. Performed at [facility], [place] [address], [place], [place] [address]   Magnesium     Status: None   Collection Time: 03/15/18  3:43 AM  Result Value Ref Range   Magnesium 1.9 1.7 - 2.4 mg/dL    Comment: Performed at [facility], [place] [address] [place], [place] [address]  Phosphorus     Status: None   Collection Time: 03/15/18  3:43 AM  Result Value Ref Range   Phosphorus 4.1 2.5 - 4.6 mg/dL    Comment: Performed at [facility] [place] [address], [place], [place] [address]  TSH     Status: Abnormal    Collection Time: 03/15/18  3:43 AM  Result Value Ref Range   TSH 0.288 (L) 0.350 - 4.500 uIU/mL    Comment: Performed by a 3rd Generation assay with a functional sensitivity of <=0.01 uIU/mL. Performed at [facility], [place] [address] [place], [place] [address]   Comprehensive metabolic panel     Status: Abnormal   Collection Time: 03/15/18  3:43 AM  Result Value Ref Range   Sodium 142 135 - 145 mmol/L   Potassium 4.6 3.5 - 5.1 mmol/L    Comment: DELTA CHECK NOTED MODERATE HEMOLYSIS    Chloride 109 101 - 111 mmol/L   CO2 22 22 - 32 mmol/L   Glucose, Bld 82 65 - 99 mg/dL   BUN 9 6 - 20 mg/dL   Creatinine, Ser 0.81 0.44 - 1.00 mg/dL   Calcium 8.5 (L) 8.9 - 10.3 mg/dL   Total Protein 6.3 (L) 6.5 - 8.1 g/dL   Albumin 2.8 (L) 3.5 - 5.0 g/dL   AST 33 15 - 41 U/L   ALT 15 14 - 54 U/L   Alkaline Phosphatase 250 (H) 38 - 126 U/L   Total Bilirubin 0.7 0.3 - 1.2 mg/dL   GFR calc non Af Amer >60 >60 mL/min   GFR calc Af Amer >60 >60 mL/min    Comment: (NOTE) The eGFR has been calculated using the CKD EPI equation. This calculation has not been validated in all clinical situations. eGFR's persistently <60 mL/min signify possible Chronic Kidney Disease.    Anion gap 11 5 - 15    Comment: Performed at [facility], [place] [address], [place], [place] [address]  CBC     Status: Abnormal   Collection Time: 03/15/18  3:43 AM  Result Value Ref Range   WBC 7.0 4.0 - 10.5 K/uL   RBC 3.90 3.87 - 5.11 MIL/uL   Hemoglobin 12.7 12.0 - 15.0 g/dL   HCT 38.7 36.0 - 46.0 %   MCV 99.2 78.0 - 100.0 fL   MCH 32.6 26.0 - 34.0 pg   MCHC 32.8 30.0 - 36.0 g/dL   RDW 16.7 (H) 11.5 - 15.5 %   Platelets 241 150 - 400 K/uL    Comment: Performed at [facility], [place] [address], [place], [place] [address]  Current Facility-Administered Medications  Medication Dose Route Frequency Provider Last Rate Last Dose  . 0.9 %  sodium chloride infusion    Intravenous Continuous Doutova, Anastassia, MD 75 mL/hr at 03/15/18 0800    . acetaminophen (TYLENOL) tablet 650 mg  650 mg Oral Q6H PRN Toy Baker, MD       Or  . acetaminophen (TYLENOL) suppository 650 mg  650 mg Rectal Q6H PRN Doutova, Anastassia, MD      . albuterol (PROVENTIL) (2.5 MG/3ML) 0.083% nebulizer solution 2.5 mg  2.5 mg Nebulization Q2H PRN Doutova, Anastassia, MD      . bisacodyl (DULCOLAX) suppository 10 mg  10 mg Rectal Daily PRN Doutova, Anastassia, MD      . enoxaparin (LOVENOX) injection 30 mg  30 mg Subcutaneous Q24H Doutova, Anastassia, MD   30 mg at 03/15/18 1001  . LORazepam (ATIVAN) injection 2-3 mg  2-3 mg Intravenous Q1H PRN Toy Baker, MD   2 mg at 03/15/18 0243  . naloxone HCl (NARCAN) 4 mg in dextrose 5 % 250 mL infusion  0.25 mg/hr Intravenous Continuous Toy Baker, MD   Stopped at 03/15/18 8309  . ondansetron (ZOFRAN) tablet 4 mg  4 mg Oral Q6H PRN Toy Baker, MD       Or  . ondansetron (ZOFRAN) injection 4 mg  4 mg Intravenous Q6H PRN Doutova, Anastassia, MD      . polyethylene glycol (MIRALAX / GLYCOLAX) packet 17 g  17 g Oral Daily PRN Doutova, Anastassia, MD      . propranolol (INDERAL) tablet 10 mg  10 mg Oral BID Toy Baker, MD   10 mg at 03/15/18 0047  . simvastatin (ZOCOR) tablet 40 mg  40 mg Oral q1800 Doutova, Anastassia, MD      . thiamine (VITAMIN B-1) tablet 100 mg  100 mg Oral Daily Toy Baker, MD        Musculoskeletal: Strength & Muscle Tone: not assessed Gait & Station: unsteady Patient leans: Front  Psychiatric Specialty Exam: Physical Exam  Psychiatric: Thought content normal. Her affect is blunt. Her speech is delayed. She is slowed. Cognition and memory are normal. She expresses impulsivity.    Review of Systems  Psychiatric/Behavioral: Positive for substance abuse.    Blood pressure 133/81, pulse 67, temperature 98.4 F (36.9 C), temperature source Oral, resp. rate 12, SpO2 100  %.There is no height or weight on file to calculate BMI.  General Appearance: Casual  Eye Contact:  Good  Speech:  Slow  Volume:  Decreased  Mood:  Dysphoric  Affect:  Blunt  Thought Process:  Coherent  Orientation:  Other:  only to place and person  Thought Content:  Logical  Suicidal Thoughts:  No  Homicidal Thoughts:  No  Memory:  Immediate;   Fair Recent;   Fair Remote;   Fair  Judgement:  Other:  marginal  Insight:  Shallow  Psychomotor Activity:  Psychomotor Retardation  Concentration:  Concentration: Fair and Attention Span: Fair  Recall:  [employer] of Knowledge:  Fair  Language:  Good  Akathisia:  No  Handed:  Right  AIMS (if indicated):     Assets:  Communication Skills Social Support  ADL's:  marginal  Cognition:  WNL  Sleep:   poor     Treatment Plan Summary: Patient with long history of Chronic pain who was brought to the hospital after she overdosed on pain medication. Patient denies attempting to kill herself and insisted it was an accident.   Plan/Recommendations: -  May benefit from Trazodone 50 mg Qhs for anxiety/depression/sleep -Unit social worker may assist with referring patient to substance abuse outpatient treatment program upon discharge. -Psychiatric service signing off. Re-consult psych as needed.  Disposition: No evidence of imminent risk to self or others at present.   Supportive therapy provided about ongoing stressors.  Corena Pilgrim, MD 03/15/2018 12:54 PM

## 2018-03-16 DIAGNOSIS — G894 Chronic pain syndrome: Secondary | ICD-10-CM

## 2018-03-16 DIAGNOSIS — T402X1D Poisoning by other opioids, accidental (unintentional), subsequent encounter: Secondary | ICD-10-CM

## 2018-03-16 DIAGNOSIS — R030 Elevated blood-pressure reading, without diagnosis of hypertension: Secondary | ICD-10-CM

## 2018-03-16 LAB — CBC WITH DIFFERENTIAL/PLATELET
Basophils Absolute: 0 10*3/uL (ref 0.0–0.1)
Basophils Relative: 1 %
EOS ABS: 0.6 10*3/uL (ref 0.0–0.7)
Eosinophils Relative: 13 %
HCT: 37.2 % (ref 36.0–46.0)
Hemoglobin: 12.4 g/dL (ref 12.0–15.0)
LYMPHS ABS: 1.3 10*3/uL (ref 0.7–4.0)
LYMPHS PCT: 27 %
MCH: 33.1 pg (ref 26.0–34.0)
MCHC: 33.3 g/dL (ref 30.0–36.0)
MCV: 99.2 fL (ref 78.0–100.0)
MONO ABS: 0.2 10*3/uL (ref 0.1–1.0)
Monocytes Relative: 5 %
Neutro Abs: 2.7 10*3/uL (ref 1.7–7.7)
Neutrophils Relative %: 54 %
PLATELETS: 372 10*3/uL (ref 150–400)
RBC: 3.75 MIL/uL — AB (ref 3.87–5.11)
RDW: 16.6 % — AB (ref 11.5–15.5)
WBC: 4.9 10*3/uL (ref 4.0–10.5)

## 2018-03-16 LAB — MAGNESIUM: MAGNESIUM: 1.2 mg/dL — AB (ref 1.7–2.4)

## 2018-03-16 LAB — BASIC METABOLIC PANEL
Anion gap: 9 (ref 5–15)
BUN: 6 mg/dL (ref 6–20)
CALCIUM: 8.7 mg/dL — AB (ref 8.9–10.3)
CO2: 22 mmol/L (ref 22–32)
CREATININE: 0.66 mg/dL (ref 0.44–1.00)
Chloride: 110 mmol/L (ref 101–111)
GFR calc Af Amer: >60 mL/min (ref >60)
GLUCOSE: 111 mg/dL — AB (ref 65–99)
Potassium: 4.1 mmol/L (ref 3.5–5.1)
SODIUM: 141 mmol/L (ref 135–145)

## 2018-03-16 LAB — GLUCOSE, CAPILLARY
Glucose-Capillary: 76 mg/dL (ref 65–99)
Glucose-Capillary: 126 mg/dL — ABNORMAL HIGH (ref 65–99)

## 2018-03-16 MED ORDER — MAGNESIUM SULFATE 4 GM/100ML IV SOLN
4.0000 g | Freq: Once | INTRAVENOUS | Status: AC
  Administered 2018-03-16: 4 g via INTRAVENOUS
  Filled 2018-03-16: qty 100

## 2018-03-16 MED ORDER — GABAPENTIN 100 MG PO CAPS
100.0000 mg | ORAL_CAPSULE | Freq: Two times a day (BID) | ORAL | Status: DC
  Administered 2018-03-16 – 2018-03-17 (×3): 100 mg via ORAL
  Filled 2018-03-16 (×3): qty 1

## 2018-03-16 MED ORDER — ENOXAPARIN SODIUM 40 MG/0.4ML ~~LOC~~ SOLN
40.0000 mg | SUBCUTANEOUS | Status: DC
  Administered 2018-03-17: 40 mg via SUBCUTANEOUS
  Filled 2018-03-16: qty 0.4

## 2018-03-16 MED ORDER — MAGNESIUM OXIDE 400 (241.3 MG) MG PO TABS
400.0000 mg | ORAL_TABLET | Freq: Every day | ORAL | Status: DC
  Administered 2018-03-16: 400 mg via ORAL
  Filled 2018-03-16: qty 1

## 2018-03-16 MED ORDER — PANTOPRAZOLE SODIUM 40 MG PO TBEC
40.0000 mg | DELAYED_RELEASE_TABLET | Freq: Every day | ORAL | Status: DC
  Administered 2018-03-16 – 2018-03-17 (×2): 40 mg via ORAL
  Filled 2018-03-16 (×2): qty 1

## 2018-03-16 MED ORDER — PAROXETINE HCL 20 MG PO TABS: 20.0000 mg | ORAL_TABLET | Freq: Every day | ORAL | Status: DC

## 2018-03-16 MED ORDER — FERROUS SULFATE 325 (65 FE) MG PO TABS
325.0000 mg | ORAL_TABLET | Freq: Two times a day (BID) | ORAL | Status: DC
  Administered 2018-03-16 – 2018-03-17 (×3): 325 mg via ORAL
  Filled 2018-03-16 (×3): qty 1

## 2018-03-16 MED ORDER — GABAPENTIN 300 MG PO CAPS: 300.0000 mg | ORAL_CAPSULE | Freq: Two times a day (BID) | ORAL | Status: DC

## 2018-03-16 MED ORDER — OXYCODONE HCL 5 MG PO TABS
10.0000 mg | ORAL_TABLET | Freq: Four times a day (QID) | ORAL | Status: DC | PRN
  Administered 2018-03-16 – 2018-03-17 (×2): 10 mg via ORAL
  Filled 2018-03-16 (×2): qty 2

## 2018-03-16 MED ORDER — MOMETASONE FURO-FORMOTEROL FUM 200-5 MCG/ACT IN AERO
2.0000 | INHALATION_SPRAY | Freq: Two times a day (BID) | RESPIRATORY_TRACT | Status: DC
  Administered 2018-03-16 – 2018-03-17 (×2): 2 via RESPIRATORY_TRACT
  Filled 2018-03-16: qty 8.8

## 2018-03-16 MED ORDER — CYCLOBENZAPRINE HCL 10 MG PO TABS: 10.0000 mg | ORAL_TABLET | Freq: Three times a day (TID) | ORAL | Status: DC | PRN

## 2018-03-16 MED ORDER — SENNOSIDES-DOCUSATE SODIUM 8.6-50 MG PO TABS
1.0000 | ORAL_TABLET | Freq: Two times a day (BID) | ORAL | Status: DC
  Administered 2018-03-16 – 2018-03-17 (×2): 1 via ORAL
  Filled 2018-03-16 (×3): qty 1

## 2018-03-16 MED ORDER — POLYETHYLENE GLYCOL 3350 17 G PO PACK
17.0000 g | PACK | Freq: Every day | ORAL | Status: DC
  Filled 2018-03-16 (×2): qty 1

## 2018-03-16 MED ORDER — MAGNESIUM OXIDE 400 (241.3 MG) MG PO TABS
400.0000 mg | ORAL_TABLET | Freq: Two times a day (BID) | ORAL | Status: DC
  Administered 2018-03-16 – 2018-03-17 (×2): 400 mg via ORAL
  Filled 2018-03-16 (×2): qty 1

## 2018-03-16 MED ORDER — LORATADINE 10 MG PO TABS
10.0000 mg | ORAL_TABLET | Freq: Every day | ORAL | Status: DC
  Administered 2018-03-16 – 2018-03-17 (×2): 10 mg via ORAL
  Filled 2018-03-16 (×2): qty 1

## 2018-03-16 MED ORDER — NALOXONE HCL 0.4 MG/ML IJ SOLN: 0.4000 mg | INTRAMUSCULAR | Status: DC | PRN

## 2018-03-16 MED ORDER — HYDROCODONE-ACETAMINOPHEN 5-325 MG PO TABS: 1.0000 | ORAL_TABLET | Freq: Four times a day (QID) | ORAL | Status: DC | PRN

## 2018-03-16 MED ORDER — HYDRALAZINE HCL 20 MG/ML IJ SOLN: 10.0000 mg | Freq: Four times a day (QID) | INTRAMUSCULAR | Status: DC | PRN

## 2018-03-16 NOTE — Progress Notes (Signed)
PROGRESS NOTE    [name]  QMG:[id number] DOB: 03-20-1946 DOA: 03/14/2018 PCP: Damaris Hippo, MD   Brief Narrative:  Patient with history of prior GI bleed, alcohol abuse, chronic pain, asthma, depression presented to the ED with overdose on opioid medications.  Patient had to be given Narcan and subsequently placed on a Narcan drip and transferred to the stepdown unit.   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  Alcohol level was less than 10.  UDS was positive for opiates.  Patient was given Narcan and subsequently placed on a Narcan drip.  Narcan drip discontinued the morning of 03/15/2018 at 3:32 AM.  Patient more alert this morning and communicative answering questions appropriately.  Patient protecting airway.  Patient tolerated regular diet since supper last night.  Chest x-ray negative for any acute infiltrate.  Patient seen by psychiatry and felt patient not at imminent risk to herself or others.  Will resume patient back on half her home regimen of oxycodone as needed as patient with chronic pain and concern of withdrawal.  Monitor closely.  2.  Hx alcohol abuse Alcohol level was less than 10 on admission.  Patient currently alert.  No signs of alcohol withdrawal.  Continue the Ativan withdrawal protocol.   3.  Asthma Stable.  Albuterol nebs as needed.  Resume home regimen of Advair.  4.  Hyperlipidemia Continue statin.  5.   Hypokalemia/hypomagnesemia Potassium repleted.  Discontinued daily supplemental potassium.  Magnesium repleted. Check BMET and magnesium.  6.  Tobacco abuse Nicotine patch.  7.  Depression Psych has consulted on the patient and had recommended trazodone 50 mg nightly for anxiety/depression and sleep.  Per psychiatry note patient denied ever taken any medications for depression or anxiety however it is noted that patient has been prescribed Prozac and Cymbalta before in the past as noted on the med rec.  Patient started on trazodone.  Patient will need to follow-up with psychiatry in the outpatient for management of her depression.  We will not resume any antidepressant medications at this time.  Per Psychiatry patient with no evidence of imminent risk to self or others at present and recommend outpatient follow-up.  8.  History of esophageal varices Currently stable.  No episodes of hematemesis.  Hemoglobin stable at 12.7.  Continue propranolol.  Place on a PPI.  9.  Acute kidney injury Improved with hydration.  Saline lock IV fluids.  Check a basic metabolic profile.  10.  Chronic pain Patient on oxycodone 20 mg every 6 hours as needed for pain.  Patient follows up in the pain clinic in the outpatient setting.  Due to patient's presentation with opioid overdose will resume her oxycodone at half home dose of 10 mg every 6 hours as needed.  Resume back home regimen of gabapentin at the 100 mg twice daily.  Will need outpatient follow-up.  11.  Elevated blood pressure Patient denies any history  of hypertension.  Component of pain may be playing a role in elevated blood pressure.  Patient on propranolol for esophageal varices which we will continue at this time.  Hydralazine as needed.   DVT prophylaxis: Lovenox Code Status: Full Family Communication: No family at bedside. Disposition Plan: Transfer to [place].   Consultants:   Psychiatry: Dr Darleene Cleaver 03/15/2018  Procedures:   Chest  x-ray 03/14/2018  Antimicrobials:   None   Subjective: Patient alert and oriented x3.  Denies any chest pain or shortness of breath.  States not sure what happened however states her husband told her that she overdosed on the oxycodone pills.  Patient denies any suicidal ideation.  No homicidal ideation.  Objective: Vitals:   03/16/18 0352 03/16/18 0400 03/16/18 0500 03/16/18 0800  BP:  (!) 142/73 (!) 178/86   Pulse:  72 81   Resp:  14 14   Temp: 97.6 F (36.4 C)   98.2 F (36.8 C)  TempSrc: Axillary   Oral  SpO2:  100% 100%   Weight:      Height:        Intake/Output Summary (Last 24 hours) at 03/16/2018 0944 Last data filed at 03/16/2018 0500 Gross per 24 hour  Intake 1556.25 ml  Output 1200 ml  Net 356.25 ml   Filed Weights   03/15/18 1842  Weight: 73.5 kg (162 lb 0.6 oz)    Examination:  General exam: Alert. Respiratory system: Lungs clear to auscultation bilaterally.  No wheezes, no crackles, no rhonchi.  Respiratory effort normal. Cardiovascular system: Regular rate and rhythm no murmurs rubs or gallops.  No lower extremity edema.  No JVD.  Gastrointestinal system: Abdomen is soft, nontender, nondistended, positive bowel sounds.  No rebound.  No guarding. Central nervous system: Alert.  Cranial nerves II through XII grossly intact.  No focal deficits.  Moving extremities spontaneously.  Extremities: Symmetric 5 x 5 power. Skin: No rashes, lesions or ulcers Psychiatry: Judgement and insight fair.  Mood is stable.     Data Reviewed: I have personally reviewed following labs and imaging studies  CBC: Recent Labs  Lab 03/14/18 1832 03/15/18 0343  WBC 8.0 7.0  NEUTROABS 3.7  --   HGB 12.2 12.7  HCT 36.8 38.7  MCV 99.5 99.2  PLT 364 211   Basic Metabolic Panel: Recent Labs  Lab 03/14/18 1832 03/15/18 0343  NA 140 142  K 3.2* 4.6  CL 104 109  CO2 24 22  GLUCOSE 79 82  BUN 12 9  CREATININE 1.12* 0.81  CALCIUM 8.5* 8.5*  MG 1.3* 1.9  PHOS 4.2 4.1     GFR: Estimated Creatinine Clearance: 66.1 mL/min (by C-G formula based on SCr of 0.81 mg/dL). Liver Function Tests: Recent Labs  Lab 03/14/18 1832 03/15/18 0343  AST 29 33  ALT 15 15  ALKPHOS 263* 250*  BILITOT 0.5 0.7  PROT 6.8 6.3*  ALBUMIN 3.0* 2.8*   No results for input(s): LIPASE, AMYLASE in the last 168 hours. No results for input(s): AMMONIA in the last 168 hours. Coagulation Profile: Recent Labs  Lab 03/14/18 1832  INR 1.12   Cardiac Enzymes: No results for input(s): CKTOTAL, CKMB, CKMBINDEX, TROPONINI in the last 168 hours. BNP (last 3 results) No results for input(s): PROBNP in the last 8760 hours. HbA1C: No results for input(s): HGBA1C in the last 72 hours. CBG: Recent Labs  Lab 03/14/18 2050 03/16/18 0728  GLUCAP 76 76   Lipid Profile: No results for input(s): CHOL, HDL, LDLCALC,  TRIG, CHOLHDL, LDLDIRECT in the last 72 hours. Thyroid Function Tests: Recent Labs    03/15/18 0343  TSH 0.288*   Anemia Panel: No results for input(s): VITAMINB12, FOLATE, FERRITIN, TIBC, IRON, RETICCTPCT in the last 72 hours. Sepsis Labs: No results for input(s): PROCALCITON, LATICACIDVEN in the last 168 hours.  Recent Results (from the past 240 hour(s))  MRSA PCR Screening     Status: None   Collection Time: 03/15/18 12:22 AM  Result Value Ref Range Status   MRSA by PCR NEGATIVE NEGATIVE Final    Comment:        The GeneXpert MRSA Assay (FDA approved for NASAL specimens only), is one component of a comprehensive MRSA colonization surveillance program. It is not intended to diagnose MRSA infection nor to guide or monitor treatment for MRSA infections. Performed at [facility], [employer] [address], [place], [place] [address]          Radiology Studies: Dg Chest 2 View  Result Date: 03/14/2018 CLINICAL DATA:  Overdose. EXAM: CHEST - 2 VIEW COMPARISON:  Radiograph of December 07, 2016. FINDINGS: The heart size and mediastinal contours are  within normal limits. Both lungs are clear. No pneumothorax or pleural effusion is noted. The visualized skeletal structures are unremarkable. IMPRESSION: No active cardiopulmonary disease. Electronically Signed   By: Marijo Conception, M.D.   On: 03/14/2018 21:17        Scheduled Meds: . enoxaparin (LOVENOX) injection  30 mg Subcutaneous Q24H  . ferrous sulfate  325 mg Oral BID WC  . gabapentin  100 mg Oral BID  . loratadine  10 mg Oral Daily  . magnesium oxide  400 mg Oral Daily  . mometasone-formoterol  2 puff Inhalation BID  . pantoprazole  40 mg Oral Daily  . polyethylene glycol  17 g Oral Daily  . propranolol  10 mg Oral BID  . senna-docusate  1 tablet Oral BID  . simvastatin  40 mg Oral q1800  . thiamine  100 mg Oral Daily  . traZODone  50 mg Oral QHS   Continuous Infusions:    LOS: 1 day    Time spent: 40 minutes    Irine Seal, MD [facility] Pager [phone]  If 7PM-7AM, please contact night-coverage [email or web address] Password [facility] 03/16/2018, 9:44 AM

## 2018-03-16 NOTE — Evaluation (Signed)
Occupational Therapy Evaluation Patient Details Name: [name] MRN: [id number] DOB: 1946-03-07 Today's Date: 03/16/2018    History of Present Illness 72 y.o. female with history of prior GI bleed, alcohol abuse, chronic pain, asthma, depression presented to the ED with overdose on opioid medications.     Clinical Impression   Pt admitted with overdose of opiod. Pt currently with functional limitations due to the deficits listed below (see OT Problem List).  Pt will benefit from skilled OT to increase their safety and independence with ADL and functional mobility for ADL to facilitate discharge to venue listed below.      Follow Up Recommendations  Home health OT    Equipment Recommendations  None recommended by OT           Mobility Bed Mobility Overal bed mobility: Needs Assistance Bed Mobility: Supine to Sit     Supine to sit: Min assist        Transfers Overall transfer level: Needs assistance Equipment used: Rolling walker (2 wheeled) Transfers: Sit to/from [facility] Sit to Stand: Min assist Stand pivot transfers: Min assist       General transfer comment: VCs hand placement    Balance Overall balance assessment: Needs assistance   Sitting balance-Leahy Scale: Good       Standing balance-Leahy Scale: Fair                             ADL either performed or assessed with clinical judgement   ADL Overall ADL's : Needs assistance/impaired Eating/Feeding: Set up;Sitting   Grooming: Min guard;Standing   Upper Body Bathing: Set up;Sitting   Lower Body Bathing: Minimal assistance;Sit to/from stand;Cueing for sequencing;Cueing for safety   Upper Body Dressing : Set up;Sitting   Lower Body Dressing: Sit to/from stand;Minimal assistance   Toilet Transfer: Minimal assistance;Ambulation;RW   Toileting- Clothing Manipulation and Hygiene: Minimal assistance;Sit to/from stand       Functional mobility during ADLs: Minimal  assistance       Vision Patient Visual Report: No change from baseline              Pertinent Vitals/Pain Pain Assessment: No/denies pain     Hand Dominance Right   Extremity/Trunk Assessment         Cervical / Trunk Assessment Cervical / Trunk Assessment: Kyphotic   Communication Communication Communication: No difficulties   Cognition Arousal/Alertness: Awake/alert Behavior During Therapy: WFL for tasks assessed/performed Overall Cognitive Status: Within Functional Limits for tasks assessed                                                Home Living Family/patient expects to be discharged to:: Private residence Living Arrangements: Spouse/significant other Available Help at Discharge: Family Type of Home: House Home Access: Ramped entrance     Home Layout: One level     Bathroom Shower/Tub: Hospital doctor Toilet: Handicapped height     Home Equipment: Environmental consultant - 4 wheels;Cane - quad          Prior Functioning/Environment Level of Independence: Independent with assistive device(s)        Comments: uses cane        OT Problem List: Decreased strength;Decreased activity tolerance;Impaired balance (sitting and/or standing);Decreased safety awareness      OT Treatment/Interventions:  Self-care/ADL training;Patient/family education;DME and/or AE instruction    OT Goals(Current goals can be found in the care plan section) Acute Rehab OT Goals Patient Stated Goal: none stated OT Goal Formulation: With patient Time For Goal Achievement: 03/30/18 ADL Goals Pt Will Perform Lower Body Dressing: with supervision;sit to/from stand Pt Will Transfer to Toilet: with supervision;regular height toilet;ambulating Pt Will Perform Toileting - Clothing Manipulation and hygiene: with supervision;sit to/from stand  OT Frequency: Min 2X/week    AM-PAC PT "6 Clicks" Daily Activity     Outcome Measure Help from another person eating  meals?: None Help from another person taking care of personal grooming?: A Little Help from another person toileting, which includes using toliet, bedpan, or urinal?: A Little Help from another person bathing (including washing, rinsing, drying)?: A Little Help from another person to put on and taking off regular upper body clothing?: A Little Help from another person to put on and taking off regular lower body clothing?: A Little 6 Click Score: 19   End of Session Equipment Utilized During Treatment: Rolling walker Nurse Communication: Mobility status  Activity Tolerance: Patient tolerated treatment well Patient left: in bed  OT Visit Diagnosis: Unsteadiness on feet (R26.81);Muscle weakness (generalized) (M62.81);History of falling (Z91.81);Repeated falls (R29.6)                Time: 9233-0076 OT Time Calculation (min): 17 min Charges:  OT General Charges $OT Visit: 1 Visit OT Evaluation $OT Eval Moderate Complexity: 1 Mod G-Codes:     Kari Baars, [place] [phone]   Payton Mccallum D 03/16/2018, 5:50 PM

## 2018-03-17 DIAGNOSIS — J45909 Unspecified asthma, uncomplicated: Secondary | ICD-10-CM

## 2018-03-17 DIAGNOSIS — T40601D Poisoning by unspecified narcotics, accidental (unintentional), subsequent encounter: Secondary | ICD-10-CM

## 2018-03-17 LAB — CBC WITH DIFFERENTIAL/PLATELET
BASOS ABS: 0 10*3/uL (ref 0.0–0.1)
Basophils Relative: 0 %
EOS PCT: 11 %
Eosinophils Absolute: 0.8 10*3/uL — ABNORMAL HIGH (ref 0.0–0.7)
HEMATOCRIT: 36.7 % (ref 36.0–46.0)
Hemoglobin: 12.2 g/dL (ref 12.0–15.0)
LYMPHS PCT: 42 %
Lymphs Abs: 2.9 10*3/uL (ref 0.7–4.0)
MCH: 32.6 pg (ref 26.0–34.0)
MCHC: 33.2 g/dL (ref 30.0–36.0)
MCV: 98.1 fL (ref 78.0–100.0)
Monocytes Absolute: 0.5 10*3/uL (ref 0.1–1.0)
Monocytes Relative: 7 %
NEUTROS ABS: 2.7 10*3/uL (ref 1.7–7.7)
Neutrophils Relative %: 40 %
PLATELETS: 395 10*3/uL (ref 150–400)
RBC: 3.74 MIL/uL — AB (ref 3.87–5.11)
RDW: 16.5 % — ABNORMAL HIGH (ref 11.5–15.5)
WBC: 6.9 10*3/uL (ref 4.0–10.5)

## 2018-03-17 LAB — BASIC METABOLIC PANEL
ANION GAP: 8 (ref 5–15)
BUN: 5 mg/dL — ABNORMAL LOW (ref 6–20)
CALCIUM: 8.8 mg/dL — AB (ref 8.9–10.3)
CO2: 23 mmol/L (ref 22–32)
Chloride: 110 mmol/L (ref 101–111)
Creatinine, Ser: 0.61 mg/dL (ref 0.44–1.00)
GFR calc Af Amer: >60 mL/min (ref >60)
Glucose, Bld: 91 mg/dL (ref 65–99)
POTASSIUM: 3.7 mmol/L (ref 3.5–5.1)
SODIUM: 141 mmol/L (ref 135–145)

## 2018-03-17 LAB — MAGNESIUM: Magnesium: 2 mg/dL (ref 1.7–2.4)

## 2018-03-17 MED ORDER — TRAZODONE HCL 50 MG PO TABS: 50.0000 mg | ORAL_TABLET | Freq: Every day | ORAL | 0 refills | Status: DC

## 2018-03-17 MED ORDER — OXYCODONE HCL 20 MG PO TABS: 10.0000 mg | ORAL_TABLET | Freq: Four times a day (QID) | ORAL | 0 refills | Status: DC | PRN

## 2018-03-17 MED ORDER — PANTOPRAZOLE SODIUM 40 MG PO TBEC: 40.0000 mg | DELAYED_RELEASE_TABLET | Freq: Every day | ORAL | 0 refills | Status: AC

## 2018-03-17 MED ORDER — MAGNESIUM OXIDE 400 MG PO TABS: 400.0000 mg | ORAL_TABLET | Freq: Two times a day (BID) | ORAL | 0 refills | Status: AC

## 2018-03-17 MED ORDER — POLYETHYLENE GLYCOL 3350 17 G PO PACK: 17.0000 g | PACK | Freq: Every day | ORAL | 0 refills | Status: DC

## 2018-03-17 MED ORDER — SENNOSIDES-DOCUSATE SODIUM 8.6-50 MG PO TABS: 1.0000 | ORAL_TABLET | Freq: Two times a day (BID) | ORAL | Status: DC

## 2018-03-17 NOTE — Discharge Summary (Signed)
Physician Discharge Summary  [name] TLX:[id number] DOB: Aug 16, 1946 DOA: 03/14/2018  PCP: Damaris Hippo, MD  Admit date: 03/14/2018 Discharge date: 03/17/2018  Time spent: 60 minutes  Recommendations for Outpatient Follow-up:  1. Follow up with Damaris Hippo, MD in 2 weeks.  On follow-up patient will need a basic metabolic profile done to follow-up on electrolytes and renal function.  Patient will need a magnesium level checked as well.  Patient was started on trazodone at bedtime for anxiety, depression and insomnia as recommended per psychiatry.  Patient's depression will need to be reassessed and referral to outpatient psychiatry may be beneficial. 2. Discharge home with home health.   Discharge Diagnoses:  Principal Problem:   Opioid overdose (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 female with medical history significant of GI bleeding and alcohol abuse, chronic pain, asthma, depression,   Presented with   episode of pain pills overdose.  Husband states he hit her oxycodone but then could not find the bottle he found her supine in the bed barely breathing EMS administered Narcan with patient quick recovery she denies any suicidal ideations stated she took only 2 tablets of oxycodone.  At some point patient did admit that they want her tablets that they were her husbands she figure out where he was hiding them.  Patient is frequently changing her story.  She states she takes her  own medication unsure what exactly she is prescribed.  Regarding pertinent Chronic problems: History of upper GI bleed with gastropathy and esophageal varices in 2018 History of C. difficile colitis treated in 2018  While in ER: Required repeated doses of Narcan and eventually started on narcan drip acetaminophen level was negative. Urine drug screen was only positive for opioids    Hospital Course:  1 opioid overdose Patient noted to have overdosed on opioid pain medications however per admitting physician patient was frequently changing her story about the quantity and medications she took.  Alcohol level was less than 10.  UDS was positive for opiates.  Patient was given Narcan and subsequently placed on a Narcan drip.  Narcan drip was discontinued the morning of 03/15/2018 at 3:32 AM.  Patient improved clinically and was communicative.  Patient protecting airway.  Patient was started on a clear liquid diet which he tolerated and diet advanced to a regular diet.  Chest x-ray which was done was negative for any acute infiltrate. Patient seen by psychiatry and felt patient not at imminent risk to herself or others.  She was started back on half her home regimen of oxycodone as needed for her chronic pain which patient tolerated.  Patient will be discharged on this new regimen and he needs to follow-up with her pain clinic in the outpatient setting.  2.  Hx alcohol abuse Alcohol level was less than 10 on admission.  Patient was placed on the Ativan withdrawal protocol.  Patient remained stable.  Patient did not have any symptoms of withdrawal during the hospitalization.  Outpatient follow-up.    3.  Asthma Stable.  Albuterol nebs as needed.  Resumed home regimen of Advair.  4.  Hyperlipidemia Continued on statin.  5.  Hypokalemia/hypomagnesemia Potassium repleted.  Discontinued daily supplemental potassium.  Magnesium repleted.  6.  Tobacco abuse Patient maintained on a nicotine  patch.  7.  Depression Psych was consulted on the patient and had recommended trazodone 50 mg nightly for anxiety/depression and sleep.  Per psychiatry note patient denied ever taken any medications for depression or anxiety however it is noted that patient has been prescribed Prozac and Cymbalta before in the past as noted on the med rec.  Patient started on trazodone.  Patient will need to follow-up with psychiatry or PCP in the outpatient for management of her depression.  Paxil and Cymbalta which was on patient's med rec was not resumed during the hospitalization nor on discharge.  Per Psychiatry patient with no evidence of imminent risk to self or others at present and recommended outpatient follow-up.  8.  History of esophageal varices Stable throughout the hospitalization.  Patient had no episodes of hematemesis.  Hemoglobin was stable at 12.2.  Patient was placed on a PPI and continued on home regimen of propranolol.  Outpatient follow-up with PCP.   9.  Acute kidney injury On admission patient noted to have some acute kidney injury with resolved with hydration.  Outpatient follow-up.    10.  Chronic pain Patient on oxycodone 20 mg every 6 hours as needed for pain.  Patient follows up in the pain clinic in the outpatient setting.  Due to patient's presentation with opioid overdose once patient had improved and was back to her baseline she was placed back on half home dose of oxycodone 10 mg every 6 hours as needed.  Patient was also placed back on gabapentin 100 mg twice daily.  Outpatient follow-up.    11.  Elevated blood pressure Patient denied any history of hypertension.  Component of pain may be playing a role in elevated blood pressure.  Patient on propranolol for esophageal varices which was continued during the hospitalization.  Patient was also placed on hydralazine as needed.      Procedures:  cxr 03/14/2018  Consultations:  Psychiatry: Dr Darleene Cleaver  03/15/2018      Discharge Exam: Vitals:   03/17/18 0839 03/17/18 0951  BP: (!) 136/96   Pulse: 75   Resp: 18   Temp: 98.7 F (37.1 C)   SpO2: 100% 98%    General: NAD Cardiovascular: RRR Respiratory: CTAB  Discharge Instructions   Discharge Instructions    Diet general   Complete by:  As directed    Increase activity slowly   Complete by:  As directed    Increase activity slowly   Complete by:  As directed      Allergies as of 03/17/2018      Reactions   Azithromycin Itching, Swelling   Morphine And Related Itching      Medication List    STOP taking these medications   DULoxetine 60 MG capsule Commonly known as:  CYMBALTA   HYDROcodone-acetaminophen 5-325 MG tablet Commonly known as:  NORCO/VICODIN   Melatonin 10 MG Caps   methocarbamol 750 MG tablet Commonly known as:  ROBAXIN   montelukast 10 MG tablet Commonly known as:  SINGULAIR   PARoxetine 10 MG tablet Commonly known as:  PAXIL   sucralfate 1 g tablet Commonly known as:  CARAFATE   zolpidem 5 MG tablet Commonly known as:  AMBIEN     TAKE these medications   albuterol 108 (90 Base) MCG/ACT inhaler Commonly known as:  PROVENTIL HFA;VENTOLIN HFA Inhale 2 puffs into the lungs every 6 (six) hours as needed for wheezing or shortness of breath.   cholestyramine 4 g packet Commonly known as:  QUESTRAN Take 1 packet (4 g total) by mouth 2 (two) times daily.   clobetasol cream 0.05 % Commonly known as:  TEMOVATE Apply 1 application topically 2 (two) times daily as needed (flare ups).   cyclobenzaprine 10 MG tablet Commonly known as:  FLEXERIL Take 10 mg by mouth 3 (three) times daily as needed for muscle spasms.   famotidine 20 MG tablet Commonly known as:  PEPCID Take 20 mg by mouth 2 (two) times daily.   ferrous sulfate 325 (65 FE) MG tablet Take 1 tablet (325 mg total) by mouth 2 (two) times daily with a meal.   Fluticasone-Salmeterol 500-50 MCG/DOSE Aepb Commonly known as:   ADVAIR DISKUS INHALE 1 PUFF INTO THE LUNGS TWICE A DAY   folic acid 1 MG tablet Commonly known as:  FOLVITE Take 1 tablet (1 mg total) by mouth daily.   gabapentin 100 MG capsule Commonly known as:  NEURONTIN Take 100 mg by mouth 2 (two) times daily. What changed:  Another medication with the same name was removed. Continue taking this medication, and follow the directions you see here.   loratadine 10 MG tablet Commonly known as:  CLARITIN Take 10 mg by mouth daily.   LORazepam 1 MG tablet Commonly known as:  ATIVAN Take 1 tablet (1 mg total) by mouth 2 (two) times daily as needed for anxiety (Anxiety, shakiness. DO NOT USE WITH ALCOHOL.).   magnesium oxide 400 MG tablet Commonly known as:  MAG-OX Take 1 tablet (400 mg total) by mouth 2 (two) times daily. What changed:    when to take this  reasons to take this   MOVANTIK 25 MG Tabs tablet Generic drug:  naloxegol oxalate Take 25 mg by mouth daily.   multivitamin with minerals Tabs tablet Take 1 tablet by mouth daily.   Oxycodone HCl 20 MG Tabs Take 0.5 tablets (10 mg total) by mouth every 6 (six) hours as needed. What changed:    how much to take  reasons to take this   pantoprazole 40 MG tablet Commonly known as:  PROTONIX Take 1 tablet (40 mg total) by mouth daily.   polyethylene glycol packet Commonly known as:  MIRALAX / GLYCOLAX Take 17 g by mouth daily. Start taking on:  03/18/2018   potassium chloride SA 20 MEQ tablet Commonly known as:  K-DUR,KLOR-CON Take 1 tablet (20 mEq total) by mouth daily.   propranolol 10 MG tablet Commonly known as:  INDERAL Take 1 tablet (10 mg total) by mouth 2 (two) times daily.   senna-docusate 8.6-50 MG tablet Commonly known as:  Senokot-S Take 1 tablet by mouth 2 (two) times daily.   simvastatin 40 MG tablet Commonly known as:  ZOCOR Take 1 tablet (40 mg total) by mouth every morning.   thiamine 100 MG tablet Take 1 tablet (100 mg total) by mouth daily.    traZODone 50 MG tablet Commonly known as:  DESYREL Take 1 tablet (50 mg total) by mouth at bedtime.   VASCEPA 1 g Caps Generic drug:  Icosapent Ethyl Take 1 g by mouth 2 (two) times daily.      Allergies  Allergen Reactions  . Azithromycin Itching and Swelling  . Morphine And Related Itching   Follow-up Information    Damaris Hippo, MD. Schedule an appointment as soon as possible for a visit in 2 week(s).   Specialty:  Family Medicine Contact information: [place] [address] [phone]            The results of significant diagnostics from this hospitalization (including imaging, microbiology, ancillary and laboratory) are listed below for reference.    Significant Diagnostic Studies: Dg Chest 2 View  Result Date: 03/14/2018 CLINICAL DATA:  Overdose. EXAM: CHEST - 2 VIEW COMPARISON:  Radiograph of December 07, 2016. FINDINGS: The heart size and mediastinal contours are within normal limits. Both lungs are clear. No pneumothorax or pleural effusion is noted. The visualized skeletal structures are unremarkable. IMPRESSION: No active cardiopulmonary disease. Electronically Signed   By: Marijo Conception, M.D.   On: 03/14/2018 21:17    Microbiology: Recent Results (from the past 240 hour(s))  MRSA PCR Screening     Status: None   Collection Time: 03/15/18 12:22 AM  Result Value Ref Range Status   MRSA by PCR NEGATIVE NEGATIVE Final    Comment:        The GeneXpert MRSA Assay (FDA approved for NASAL specimens only), is one component of a comprehensive MRSA colonization surveillance program. It is not intended to diagnose MRSA infection nor to guide or monitor treatment for MRSA infections. Performed at [facility], [place] [address] [place], [place] [address]      Labs: Basic Metabolic Panel: Recent Labs  Lab 03/14/18 1832 03/15/18 0343 03/16/18 0943 03/17/18 0612  NA 140 142 141 141  K 3.2* 4.6 4.1 3.7  CL  104 109 110 110  CO2 24 22 22 23   GLUCOSE 79 82 111* 91  BUN 12 9 6  5*  CREATININE 1.12* 0.81 0.66 0.61  CALCIUM 8.5* 8.5* 8.7* 8.8*  MG 1.3* 1.9 1.2* 2.0  PHOS 4.2 4.1  --   --    Liver Function Tests: Recent Labs  Lab 03/14/18 1832 03/15/18 0343  AST 29 33  ALT 15 15  ALKPHOS 263* 250*  BILITOT 0.5 0.7  PROT 6.8 6.3*  ALBUMIN 3.0* 2.8*   No results for input(s): LIPASE, AMYLASE in the last 168 hours. No results for input(s): AMMONIA in the last 168 hours. CBC: Recent Labs  Lab 03/14/18 1832 03/15/18 0343 03/16/18 0943 03/17/18 0612  WBC 8.0 7.0 4.9 6.9  NEUTROABS 3.7  --  2.7 2.7  HGB 12.2 12.7 12.4 12.2  HCT 36.8 38.7 37.2 36.7  MCV 99.5 99.2 99.2 98.1  PLT 364 241 372 395   Cardiac Enzymes: No results for input(s): CKTOTAL, CKMB, CKMBINDEX, TROPONINI in the last 168 hours. BNP: BNP (last 3 results) No results for input(s): BNP in the last 8760 hours.  ProBNP (last 3 results) No results for input(s): PROBNP in the last 8760 hours.  CBG: Recent Labs  Lab 03/14/18 2050 03/16/18 0728 03/16/18 1202  GLUCAP 76 76 126*       Signed:  Irine Seal MD.  [facility] 03/17/2018, 12:08 PM

## 2018-03-17 NOTE — Progress Notes (Signed)
Discharge instructions reviewed with patient. RXs given. All questions answered at this time. Awaiting for spouse for transportation.    Ave Filter, RN

## 2018-03-17 NOTE — Care Management Note (Signed)
Case Management Note  Patient Details  Name: [name] MRN: [id number] Date of Birth: 19-Sep-1946  Subjective/Objective:                  Need for psychiatrist  Action/Plan: Patient informed to call the number for her case worker under medicaid and request a referral to a local psychiatrist.  Expected Discharge Date:  03/17/18               Expected Discharge Plan:  [place]  In-House Referral:     Discharge planning Services  CM Consult  Post Acute Care Choice:  Resumption of Svcs/PTA Provider Choice offered to:  Patient  DME Arranged:    DME Agency:     HH Arranged:  PT, OT, Social Work [employer] Agency:  [facility]  Status of Service:  Completed, signed off  If discussed at [employer], dates discussed:    Additional Comments:  Leeroy Cha, RN 03/17/2018, 11:56 AM

## 2018-03-17 NOTE — Progress Notes (Signed)
Occupational Therapy Treatment Patient Details Name: [name] MRN: [id number] DOB: October 22, 1946 Today's Date: 03/17/2018    History of present illness 72 y.o. female with history of prior GI bleed, alcohol abuse, chronic pain, asthma, depression presented to the ED with overdose on opioid medications.     OT comments  Pt much more engaged this day  Follow Up Recommendations  Home health OT    Equipment Recommendations  None recommended by OT    Recommendations for Other Services      Precautions / Restrictions Precautions Precautions: Fall Precaution Comments: pt denied h/o falls Restrictions Weight Bearing Restrictions: No       Mobility Bed Mobility Overal bed mobility: Modified Independent                Transfers Overall transfer level: Needs assistance   Transfers: Sit to/from Stand;Stand Pivot Transfers Sit to Stand: Supervision Stand pivot transfers: Supervision       General transfer comment: VC for safety    Balance Overall balance assessment: Needs assistance Sitting-balance support: No upper extremity supported Sitting balance-Leahy Scale: Good     Standing balance support: No upper extremity supported Standing balance-Leahy Scale: Fair                             ADL either performed or assessed with clinical judgement   ADL Overall ADL's : Needs assistance/impaired     Grooming: Wash/dry face;Wash/dry hands;Oral care;Standing;Supervision/safety   Upper Body Bathing: Set up;Standing   Lower Body Bathing: Set up;Sit to/from stand Lower Body Bathing Details (indicate cue type and reason): stand at sink Upper Body Dressing : Set up;Sitting   Lower Body Dressing: Supervision/safety;Sit to/from stand   Toilet Transfer: Supervision/safety;RW;Comfort height toilet   Toileting- Clothing Manipulation and Hygiene: Supervision/safety;Sit to/from stand         General ADL Comments: Pt stood at sink for bathing     Vision  Patient Visual Report: No change from baseline            Cognition Arousal/Alertness: Awake/alert Behavior During Therapy: WFL for tasks assessed/performed Overall Cognitive Status: Within Functional Limits for tasks assessed                                                     Pertinent Vitals/ Pain       Pain Assessment: No/denies pain     Prior Functioning/Environment              Frequency  Min 2X/week        Progress Toward Goals  OT Goals(current goals can now be found in the care plan section)  Progress towards OT goals: Progressing toward goals     Plan Discharge plan remains appropriate       AM-PAC PT "6 Clicks" Daily Activity     Outcome Measure   Help from another person eating meals?: None Help from another person taking care of personal grooming?: None Help from another person toileting, which includes using toliet, bedpan, or urinal?: A Little Help from another person bathing (including washing, rinsing, drying)?: A Little Help from another person to put on and taking off regular upper body clothing?: None Help from another person to put on and taking off regular lower body clothing?: A Little 6 Click Score:  21    End of Session Equipment Utilized During Treatment: Rolling walker  OT Visit Diagnosis: Unsteadiness on feet (R26.81);Muscle weakness (generalized) (M62.81);History of falling (Z91.81);Repeated falls (R29.6)   Activity Tolerance Patient tolerated treatment well   Patient Left in bed   Nurse Communication Mobility status        Time: 6701-1003 OT Time Calculation (min): 24 min  Charges: OT General Charges $OT Visit: 1 Visit OT Treatments $Self Care/Home Management : 23-37 mins  [place], [place]   Payton Mccallum D 03/17/2018, 11:55 AM

## 2018-03-17 NOTE — Care Management Note (Signed)
Case Management Note  Patient Details  Name: [name] MRN: [id number] Date of Birth: 09-Dec-1945  Subjective/Objective:                  hhc needs  Action/Plan: [facility] prders.  Expected Discharge Date:                  Expected Discharge Plan:  [place]  In-House Referral:     Discharge planning Services  CM Consult  Post Acute Care Choice:  Resumption of Svcs/PTA Provider Choice offered to:  Patient  DME Arranged:    DME Agency:     HH Arranged:  PT, OT, Social Work [employer] Agency:  [facility]  Status of Service:  Completed, signed off  If discussed at [employer], dates discussed:    Additional Comments:  Leeroy Cha, RN 03/17/2018, 11:18 AM

## 2018-03-17 NOTE — Progress Notes (Signed)
LCSW consulted for SA and med access.  Patient refused LCSW services and SA resources. Patient stated " I am 72 years old and I don't need nobody else telling me what to do"   RNCM can assist with access to meds. RNCM has been consulted.   LCSW signing off. No CSW needs.   Carolin Coy [place]

## 2018-03-17 NOTE — Progress Notes (Signed)
RN offered to assist pt up in chair today for breakfast, pt declined.   Ave Filter, RN

## 2018-03-18 DIAGNOSIS — S72012A Unspecified intracapsular fracture of left femur, initial encounter for closed fracture: Secondary | ICD-10-CM | POA: Diagnosis not present

## 2018-03-18 DIAGNOSIS — M6281 Muscle weakness (generalized): Secondary | ICD-10-CM | POA: Diagnosis not present

## 2018-03-18 DIAGNOSIS — R269 Unspecified abnormalities of gait and mobility: Secondary | ICD-10-CM | POA: Diagnosis not present

## 2018-03-18 DIAGNOSIS — R278 Other lack of coordination: Secondary | ICD-10-CM | POA: Diagnosis not present

## 2018-03-18 DIAGNOSIS — Z5181 Encounter for therapeutic drug level monitoring: Secondary | ICD-10-CM | POA: Diagnosis not present

## 2018-03-18 DIAGNOSIS — Z4789 Encounter for other orthopedic aftercare: Secondary | ICD-10-CM | POA: Diagnosis not present

## 2018-03-18 DIAGNOSIS — J4550 Severe persistent asthma, uncomplicated: Secondary | ICD-10-CM | POA: Diagnosis not present

## 2018-04-20 DIAGNOSIS — G894 Chronic pain syndrome: Secondary | ICD-10-CM | POA: Diagnosis not present

## 2018-04-20 DIAGNOSIS — K219 Gastro-esophageal reflux disease without esophagitis: Secondary | ICD-10-CM | POA: Diagnosis not present

## 2018-04-20 DIAGNOSIS — I8500 Esophageal varices without bleeding: Secondary | ICD-10-CM | POA: Diagnosis not present

## 2018-04-20 DIAGNOSIS — E876 Hypokalemia: Secondary | ICD-10-CM | POA: Diagnosis not present

## 2018-04-20 DIAGNOSIS — Z79891 Long term (current) use of opiate analgesic: Secondary | ICD-10-CM | POA: Diagnosis not present

## 2018-04-20 DIAGNOSIS — I10 Essential (primary) hypertension: Secondary | ICD-10-CM | POA: Diagnosis not present

## 2018-04-20 DIAGNOSIS — E785 Hyperlipidemia, unspecified: Secondary | ICD-10-CM | POA: Diagnosis not present

## 2018-04-20 DIAGNOSIS — M545 Low back pain: Secondary | ICD-10-CM | POA: Diagnosis not present

## 2018-04-20 DIAGNOSIS — D508 Other iron deficiency anemias: Secondary | ICD-10-CM | POA: Diagnosis not present

## 2018-04-20 DIAGNOSIS — J4540 Moderate persistent asthma, uncomplicated: Secondary | ICD-10-CM | POA: Diagnosis not present

## 2018-04-20 DIAGNOSIS — K7030 Alcoholic cirrhosis of liver without ascites: Secondary | ICD-10-CM | POA: Diagnosis not present

## 2018-04-20 DIAGNOSIS — F419 Anxiety disorder, unspecified: Secondary | ICD-10-CM | POA: Diagnosis not present

## 2018-04-20 DIAGNOSIS — M79605 Pain in left leg: Secondary | ICD-10-CM | POA: Diagnosis not present

## 2018-04-24 DIAGNOSIS — J45909 Unspecified asthma, uncomplicated: Secondary | ICD-10-CM | POA: Diagnosis not present

## 2018-04-24 DIAGNOSIS — G4700 Insomnia, unspecified: Secondary | ICD-10-CM | POA: Diagnosis not present

## 2018-04-24 DIAGNOSIS — L299 Pruritus, unspecified: Secondary | ICD-10-CM | POA: Diagnosis not present

## 2018-04-24 DIAGNOSIS — G894 Chronic pain syndrome: Secondary | ICD-10-CM | POA: Diagnosis not present

## 2018-05-28 ENCOUNTER — Other Ambulatory Visit: Payer: Self-pay | Admitting: Internal Medicine

## 2018-05-28 DIAGNOSIS — Z1231 Encounter for screening mammogram for malignant neoplasm of breast: Secondary | ICD-10-CM

## 2018-06-01 NOTE — Telephone Encounter (Signed)
Called her to advise that Medicare would not pay for shoes unless she is diabtetic.

## 2018-06-05 DIAGNOSIS — M79605 Pain in left leg: Secondary | ICD-10-CM | POA: Diagnosis not present

## 2018-06-05 DIAGNOSIS — M545 Low back pain: Secondary | ICD-10-CM | POA: Diagnosis not present

## 2018-06-05 DIAGNOSIS — G894 Chronic pain syndrome: Secondary | ICD-10-CM | POA: Diagnosis not present

## 2018-06-05 DIAGNOSIS — Z79891 Long term (current) use of opiate analgesic: Secondary | ICD-10-CM | POA: Diagnosis not present

## 2018-06-30 ENCOUNTER — Ambulatory Visit: Payer: Medicare HMO | Admitting: Podiatry

## 2018-07-03 DIAGNOSIS — M545 Low back pain: Secondary | ICD-10-CM | POA: Diagnosis not present

## 2018-07-03 DIAGNOSIS — M79605 Pain in left leg: Secondary | ICD-10-CM | POA: Diagnosis not present

## 2018-07-03 DIAGNOSIS — G894 Chronic pain syndrome: Secondary | ICD-10-CM | POA: Diagnosis not present

## 2018-07-03 DIAGNOSIS — Z79891 Long term (current) use of opiate analgesic: Secondary | ICD-10-CM | POA: Diagnosis not present

## 2018-07-07 DIAGNOSIS — J45909 Unspecified asthma, uncomplicated: Secondary | ICD-10-CM | POA: Diagnosis not present

## 2018-07-07 DIAGNOSIS — G4700 Insomnia, unspecified: Secondary | ICD-10-CM | POA: Diagnosis not present

## 2018-07-07 DIAGNOSIS — L299 Pruritus, unspecified: Secondary | ICD-10-CM | POA: Diagnosis not present

## 2018-07-07 DIAGNOSIS — F341 Dysthymic disorder: Secondary | ICD-10-CM | POA: Diagnosis not present

## 2018-07-07 DIAGNOSIS — K5909 Other constipation: Secondary | ICD-10-CM | POA: Diagnosis not present

## 2018-07-07 DIAGNOSIS — K219 Gastro-esophageal reflux disease without esophagitis: Secondary | ICD-10-CM | POA: Diagnosis not present

## 2018-07-07 DIAGNOSIS — G894 Chronic pain syndrome: Secondary | ICD-10-CM | POA: Diagnosis not present

## 2018-07-22 DIAGNOSIS — K7030 Alcoholic cirrhosis of liver without ascites: Secondary | ICD-10-CM | POA: Diagnosis not present

## 2018-07-22 DIAGNOSIS — J4540 Moderate persistent asthma, uncomplicated: Secondary | ICD-10-CM | POA: Diagnosis not present

## 2018-07-22 DIAGNOSIS — E785 Hyperlipidemia, unspecified: Secondary | ICD-10-CM | POA: Diagnosis not present

## 2018-07-22 DIAGNOSIS — F419 Anxiety disorder, unspecified: Secondary | ICD-10-CM | POA: Diagnosis not present

## 2018-07-22 DIAGNOSIS — E876 Hypokalemia: Secondary | ICD-10-CM | POA: Diagnosis not present

## 2018-07-22 DIAGNOSIS — Z720 Tobacco use: Secondary | ICD-10-CM | POA: Diagnosis not present

## 2018-07-22 DIAGNOSIS — K219 Gastro-esophageal reflux disease without esophagitis: Secondary | ICD-10-CM | POA: Diagnosis not present

## 2018-07-23 ENCOUNTER — Ambulatory Visit: Payer: Medicare HMO | Admitting: Podiatry

## 2018-08-03 DIAGNOSIS — Z79891 Long term (current) use of opiate analgesic: Secondary | ICD-10-CM | POA: Diagnosis not present

## 2018-08-03 DIAGNOSIS — M79605 Pain in left leg: Secondary | ICD-10-CM | POA: Diagnosis not present

## 2018-08-03 DIAGNOSIS — G894 Chronic pain syndrome: Secondary | ICD-10-CM | POA: Diagnosis not present

## 2018-08-03 DIAGNOSIS — M545 Low back pain: Secondary | ICD-10-CM | POA: Diagnosis not present

## 2018-08-08 DIAGNOSIS — L732 Hidradenitis suppurativa: Secondary | ICD-10-CM | POA: Diagnosis not present

## 2018-08-08 DIAGNOSIS — L299 Pruritus, unspecified: Secondary | ICD-10-CM | POA: Diagnosis not present

## 2018-08-08 DIAGNOSIS — F1010 Alcohol abuse, uncomplicated: Secondary | ICD-10-CM | POA: Diagnosis not present

## 2018-08-08 DIAGNOSIS — G894 Chronic pain syndrome: Secondary | ICD-10-CM | POA: Diagnosis not present

## 2018-08-11 ENCOUNTER — Ambulatory Visit: Payer: Medicare HMO | Admitting: Podiatry

## 2018-08-31 DIAGNOSIS — G894 Chronic pain syndrome: Secondary | ICD-10-CM | POA: Diagnosis not present

## 2018-08-31 DIAGNOSIS — M79605 Pain in left leg: Secondary | ICD-10-CM | POA: Diagnosis not present

## 2018-08-31 DIAGNOSIS — Z79891 Long term (current) use of opiate analgesic: Secondary | ICD-10-CM | POA: Diagnosis not present

## 2018-08-31 DIAGNOSIS — M545 Low back pain: Secondary | ICD-10-CM | POA: Diagnosis not present

## 2018-09-16 DIAGNOSIS — H04123 Dry eye syndrome of bilateral lacrimal glands: Secondary | ICD-10-CM | POA: Diagnosis not present

## 2018-09-16 DIAGNOSIS — Z821 Family history of blindness and visual loss: Secondary | ICD-10-CM | POA: Diagnosis not present

## 2018-09-16 DIAGNOSIS — H2513 Age-related nuclear cataract, bilateral: Secondary | ICD-10-CM | POA: Diagnosis not present

## 2018-09-18 DIAGNOSIS — M62838 Other muscle spasm: Secondary | ICD-10-CM | POA: Diagnosis not present

## 2018-09-18 DIAGNOSIS — E782 Mixed hyperlipidemia: Secondary | ICD-10-CM | POA: Diagnosis not present

## 2018-09-18 DIAGNOSIS — G894 Chronic pain syndrome: Secondary | ICD-10-CM | POA: Diagnosis not present

## 2018-09-18 DIAGNOSIS — F341 Dysthymic disorder: Secondary | ICD-10-CM | POA: Diagnosis not present

## 2018-09-28 DIAGNOSIS — M545 Low back pain: Secondary | ICD-10-CM | POA: Diagnosis not present

## 2018-09-28 DIAGNOSIS — Z79891 Long term (current) use of opiate analgesic: Secondary | ICD-10-CM | POA: Diagnosis not present

## 2018-09-28 DIAGNOSIS — G894 Chronic pain syndrome: Secondary | ICD-10-CM | POA: Diagnosis not present

## 2018-09-28 DIAGNOSIS — M79605 Pain in left leg: Secondary | ICD-10-CM | POA: Diagnosis not present

## 2018-10-13 DIAGNOSIS — H52223 Regular astigmatism, bilateral: Secondary | ICD-10-CM | POA: Diagnosis not present

## 2018-10-13 DIAGNOSIS — H524 Presbyopia: Secondary | ICD-10-CM | POA: Diagnosis not present

## 2018-10-21 DIAGNOSIS — K7030 Alcoholic cirrhosis of liver without ascites: Secondary | ICD-10-CM | POA: Diagnosis not present

## 2018-10-21 DIAGNOSIS — F419 Anxiety disorder, unspecified: Secondary | ICD-10-CM | POA: Diagnosis not present

## 2018-10-21 DIAGNOSIS — K219 Gastro-esophageal reflux disease without esophagitis: Secondary | ICD-10-CM | POA: Diagnosis not present

## 2018-10-21 DIAGNOSIS — J4540 Moderate persistent asthma, uncomplicated: Secondary | ICD-10-CM | POA: Diagnosis not present

## 2018-10-21 DIAGNOSIS — E785 Hyperlipidemia, unspecified: Secondary | ICD-10-CM | POA: Diagnosis not present

## 2018-10-21 DIAGNOSIS — G8929 Other chronic pain: Secondary | ICD-10-CM | POA: Diagnosis not present

## 2018-10-21 DIAGNOSIS — Z720 Tobacco use: Secondary | ICD-10-CM | POA: Diagnosis not present

## 2018-10-21 DIAGNOSIS — I10 Essential (primary) hypertension: Secondary | ICD-10-CM | POA: Diagnosis not present

## 2018-10-21 DIAGNOSIS — D508 Other iron deficiency anemias: Secondary | ICD-10-CM | POA: Diagnosis not present

## 2018-10-26 DIAGNOSIS — M79605 Pain in left leg: Secondary | ICD-10-CM | POA: Diagnosis not present

## 2018-10-26 DIAGNOSIS — M545 Low back pain: Secondary | ICD-10-CM | POA: Diagnosis not present

## 2018-10-26 DIAGNOSIS — G894 Chronic pain syndrome: Secondary | ICD-10-CM | POA: Diagnosis not present

## 2018-10-26 DIAGNOSIS — Z79891 Long term (current) use of opiate analgesic: Secondary | ICD-10-CM | POA: Diagnosis not present

## 2018-10-31 ENCOUNTER — Other Ambulatory Visit: Payer: Self-pay

## 2018-10-31 ENCOUNTER — Emergency Department (HOSPITAL_COMMUNITY): Payer: Medicare HMO

## 2018-10-31 ENCOUNTER — Encounter (HOSPITAL_COMMUNITY): Payer: Self-pay | Admitting: *Deleted

## 2018-10-31 ENCOUNTER — Emergency Department (HOSPITAL_COMMUNITY)
Admission: EM | Admit: 2018-10-31 | Discharge: 2018-10-31 | Disposition: A | Discharge status: Home / Self Care | Payer: Medicare HMO | Attending: Emergency Medicine | Admitting: Emergency Medicine

## 2018-10-31 DIAGNOSIS — F17210 Nicotine dependence, cigarettes, uncomplicated: Secondary | ICD-10-CM | POA: Insufficient documentation

## 2018-10-31 DIAGNOSIS — T50901A Poisoning by unspecified drugs, medicaments and biological substances, accidental (unintentional), initial encounter: Secondary | ICD-10-CM | POA: Diagnosis not present

## 2018-10-31 DIAGNOSIS — R000 Tachycardia, unspecified: Secondary | ICD-10-CM | POA: Diagnosis not present

## 2018-10-31 DIAGNOSIS — G8929 Other chronic pain: Secondary | ICD-10-CM | POA: Insufficient documentation

## 2018-10-31 DIAGNOSIS — R0902 Hypoxemia: Secondary | ICD-10-CM | POA: Diagnosis not present

## 2018-10-31 DIAGNOSIS — Z79899 Other long term (current) drug therapy: Secondary | ICD-10-CM | POA: Insufficient documentation

## 2018-10-31 DIAGNOSIS — R401 Stupor: Secondary | ICD-10-CM | POA: Diagnosis not present

## 2018-10-31 DIAGNOSIS — R4182 Altered mental status, unspecified: Secondary | ICD-10-CM | POA: Diagnosis present

## 2018-10-31 DIAGNOSIS — R9431 Abnormal electrocardiogram [ECG] [EKG]: Secondary | ICD-10-CM | POA: Diagnosis not present

## 2018-10-31 DIAGNOSIS — R4020 Unspecified coma: Secondary | ICD-10-CM | POA: Diagnosis not present

## 2018-10-31 LAB — URINALYSIS, COMPLETE (UACMP) WITH MICROSCOPIC
Bilirubin Urine: NEGATIVE
Glucose, UA: NEGATIVE mg/dL
Hgb urine dipstick: NEGATIVE
Ketones, ur: NEGATIVE mg/dL
Leukocytes, UA: NEGATIVE
Nitrite: NEGATIVE
Protein, ur: NEGATIVE mg/dL
RBC / HPF: NONE SEEN RBC/hpf (ref 0–5)
Specific Gravity, Urine: 1.01 (ref 1.005–1.030)
WBC, UA: NONE SEEN WBC/hpf (ref 0–5)
pH: 5.5 (ref 5.0–8.0)

## 2018-10-31 LAB — CBC WITH DIFFERENTIAL/PLATELET
Abs Immature Granulocytes: 0.01 10*3/uL (ref 0.00–0.07)
Basophils Absolute: 0.1 10*3/uL (ref 0.0–0.1)
Basophils Relative: 1 %
Eosinophils Absolute: 0.7 10*3/uL — ABNORMAL HIGH (ref 0.0–0.5)
Eosinophils Relative: 9 %
HCT: 40.6 % (ref 36.0–46.0)
Hemoglobin: 13.1 g/dL (ref 12.0–15.0)
Immature Granulocytes: 0 %
Lymphocytes Relative: 46 %
Lymphs Abs: 3.3 10*3/uL (ref 0.7–4.0)
MCH: 31 pg (ref 26.0–34.0)
MCHC: 32.3 g/dL (ref 30.0–36.0)
MCV: 96 fL (ref 80.0–100.0)
Monocytes Absolute: 0.5 10*3/uL (ref 0.1–1.0)
Monocytes Relative: 7 %
Neutro Abs: 2.6 10*3/uL (ref 1.7–7.7)
Neutrophils Relative %: 37 %
PLATELETS: 329 10*3/uL (ref 150–400)
RBC: 4.23 MIL/uL (ref 3.87–5.11)
RDW: 16 % — ABNORMAL HIGH (ref 11.5–15.5)
WBC: 7.1 10*3/uL (ref 4.0–10.5)
nRBC: 0 % (ref 0.0–0.2)

## 2018-10-31 LAB — PROTIME-INR
INR: 1.05
Prothrombin Time: 13.6 seconds (ref 11.4–15.2)

## 2018-10-31 LAB — COMPREHENSIVE METABOLIC PANEL
ALT: 27 U/L (ref 0–44)
AST: 55 U/L — ABNORMAL HIGH (ref 15–41)
Albumin: 3.6 g/dL (ref 3.5–5.0)
Alkaline Phosphatase: 285 U/L — ABNORMAL HIGH (ref 38–126)
Anion gap: 12 (ref 5–15)
BUN: 16 mg/dL (ref 8–23)
CO2: 23 mmol/L (ref 22–32)
Calcium: 9 mg/dL (ref 8.9–10.3)
Chloride: 99 mmol/L (ref 98–111)
Creatinine, Ser: 1.61 mg/dL — ABNORMAL HIGH (ref 0.44–1.00)
GFR calc Af Amer: 37 mL/min — ABNORMAL LOW (ref >60)
GFR calc non Af Amer: 32 mL/min — ABNORMAL LOW (ref >60)
Glucose, Bld: 96 mg/dL (ref 70–99)
Potassium: 4.7 mmol/L (ref 3.5–5.1)
Sodium: 134 mmol/L — ABNORMAL LOW (ref 135–145)
Total Bilirubin: 0.9 mg/dL (ref 0.3–1.2)
Total Protein: 7.5 g/dL (ref 6.5–8.1)

## 2018-10-31 LAB — CBG MONITORING, ED: Glucose-Capillary: 92 mg/dL (ref 70–99)

## 2018-10-31 LAB — ETHANOL: Alcohol, Ethyl (B): <10 mg/dL (ref <10)

## 2018-10-31 LAB — I-STAT CG4 LACTIC ACID, ED: Lactic Acid, Venous: 1.72 mmol/L (ref 0.5–1.9)

## 2018-10-31 MED ORDER — NALOXONE HCL 0.4 MG/ML IJ SOLN
0.4000 mg | Freq: Once | INTRAMUSCULAR | Status: AC
  Administered 2018-10-31: 0.4 mg via INTRAVENOUS
  Filled 2018-10-31: qty 1

## 2018-10-31 MED ORDER — SODIUM CHLORIDE 0.9 % IV SOLN
INTRAVENOUS | Status: DC
  Administered 2018-10-31: 19:00:00 via INTRAVENOUS

## 2018-10-31 MED ORDER — SODIUM CHLORIDE 0.9 % IV BOLUS
1000.0000 mL | Freq: Once | INTRAVENOUS | Status: AC
  Administered 2018-10-31: 1000 mL via INTRAVENOUS

## 2018-10-31 NOTE — ED Notes (Signed)
The pt has been  Alert since she had the narcan  She is still confused about the date year and month. She admits to taking a percocet  ??? Maybe more ??  She gets drowsy then becomes more alert

## 2018-10-31 NOTE — ED Provider Notes (Addendum)
[facility] EMERGENCY DEPARTMENT Provider Note   CSN: [id number] Arrival date & time: 10/31/18  1553     History   Chief Complaint No chief complaint on file.   HPI [name] is a 72 y.o. female.  HPI Patient presents via private vehicle, after being found at church, reportedly, in unusual state. The patient herself is essentially nonverbal, offering only inconsistent, minimally audible responses to any questions, level 5 caveat secondary to altered mental status. She cannot specify why she is here, nor any details of what she was doing prior to arrival. She seems to deny pain, though this is unclear as well. Reportedly the patient was found by friends in church, sitting upright, but acting in an atypical manner. She is brought here by those individuals, who are not currently present.   Past Medical History:  Diagnosis Date  . Alcohol abuse   . Alcohol abuse   . Allergy   . Arthritis    back-severe, hips, right knee  . Asthma   . GERD (gastroesophageal reflux disease)    occasional  . H/O measles   . H/O mumps   . Hypercholesteremia    under control  . Insomnia   . Psoriasis (a type of skin inflammation)   . Renal cell carcinoma 2012   left  . Seasonal allergies     Patient Active Problem List   Diagnosis Date Noted  . Chronic pain syndrome   . Hypomagnesemia   . Blood pressure elevated without history of HTN   . Opiate overdose ([place])   . Hypokalemia 03/14/2018  . AKI (acute kidney injury) ([place]) 03/14/2018  . Opioid overdose ([place]) 03/14/2018  . Opioid abuse ([place]) 03/14/2018  . C. difficile colitis 12/13/2016  . Alcohol withdrawal with delirium in inpatient treatment ([place])   . Acute alcohol abuse, with unspecified complication ([place])   . Tachypnea   . UGI bleed 12/07/2016  . Acute upper GI bleed 10/25/2016  . Varices of esophagus determined by endoscopy ([place]) 10/25/2016  . Displaced oblique fracture of shaft of left femur ([place])  04/13/2016  . Acute upper GI bleeding 04/05/2016  . Upper GI bleed 04/05/2016  . Rotator cuff tendinitis 03/11/2016  . Alcohol withdrawal ([place]) 01/04/2016  . Acute blood loss anemia 01/04/2016  . Tobacco abuse 01/03/2016  . Diarrhea 01/03/2016  . Depression 01/03/2016  . Alcohol abuse   . Chronic back pain 08/18/2015  . Seasonal allergies 07/05/2015  . Insomnia 07/05/2015  . Arthritis 07/05/2015  . Psoriasis 07/05/2015  . Menopausal hot flushes 06/01/2015  . Dyspareunia 06/01/2015  . Multiple falls 05/03/2015  . Encounter for medication review 05/03/2015  . Hot flashes 05/03/2015  . Herniated lumbar intervertebral disc 10/12/2014  . Generalized abdominal pain 09/25/2013  . Anemia of chronic disease 09/11/2013  . Asthma 04/24/2007  . Hyperlipidemia 01/04/2007    Past Surgical History:  Procedure Laterality Date  . ABDOMINAL HYSTERECTOMY  40years ago  . BUNIONECTOMY  04/2011  . CHOLECYSTECTOMY  11/13/2011   Procedure: LAPAROSCOPIC CHOLECYSTECTOMY WITH INTRAOPERATIVE CHOLANGIOGRAM;  Surgeon: Judieth Keens, DO;  Location: [facility];  Service: General;  Laterality: N/A;  . COLONOSCOPY N/A 09/25/2013   Procedure: COLONOSCOPY;  Surgeon: Lear Ng, MD;  Location: [facility];  Service: Endoscopy;  Laterality: N/A;  . ESOPHAGOGASTRODUODENOSCOPY N/A 09/25/2013   Procedure: ESOPHAGOGASTRODUODENOSCOPY (EGD);  Surgeon: Lear Ng, MD;  Location: [facility];  Service: Endoscopy;  Laterality: N/A;  . ESOPHAGOGASTRODUODENOSCOPY N/A 04/07/2016   Procedure: ESOPHAGOGASTRODUODENOSCOPY (EGD);  Surgeon: Milus Banister, MD;  Location: [facility];  Service: Endoscopy;  Laterality: N/A;  . ESOPHAGOGASTRODUODENOSCOPY N/A 10/26/2016   Procedure: ESOPHAGOGASTRODUODENOSCOPY (EGD);  Surgeon: Arta Silence, MD;  Location: [facility];  Service: Endoscopy;  Laterality: N/A;  . ESOPHAGOGASTRODUODENOSCOPY N/A 12/07/2016   Procedure: ESOPHAGOGASTRODUODENOSCOPY (EGD);  Surgeon: Wonda Horner, MD;  Location: [facility];  Service: Endoscopy;  Laterality: N/A;  . ESOPHAGOGASTRODUODENOSCOPY (EGD) WITH PROPOFOL Left 01/03/2016   Procedure: ESOPHAGOGASTRODUODENOSCOPY (EGD) WITH PROPOFOL;  Surgeon: Arta Silence, MD;  Location: [facility];  Service: Endoscopy;  Laterality: Left;  . FEMUR IM NAIL Left 04/15/2016   Procedure: INTRAMEDULLARY (IM) RETROGRADE FEMORAL NAILING;  Surgeon: Gaynelle Arabian, MD;  Location: [facility];  Service: Orthopedics;  Laterality: Left;  . HERNIA REPAIR  06/5630   supraumbilical repair  . KIDNEY SURGERY  12/2010   [facility]; partial nephrectomy  . LUMBAR LAMINECTOMY/DECOMPRESSION MICRODISCECTOMY Right 10/12/2014   Procedure: HEMI LAMINECTOMY MICRODISCECTOMY L5-S1 RIGHT (1 LEVEL);  Surgeon: Tobi Bastos, MD;  Location: [facility];  Service: Orthopedics;  Laterality: Right;  . MENISECTOMY  2010   left knee  . TUBAL LIGATION  44 years ago     OB History    Gravida  3   Para  3   Term      Preterm      AB      Living  3     SAB      TAB      Ectopic      Multiple      Live Births               Home Medications    Prior to Admission medications   Medication Sig Start Date End Date Taking? Authorizing Provider  albuterol (PROVENTIL HFA;VENTOLIN HFA) 108 (90 Base) MCG/ACT inhaler Inhale 2 puffs into the lungs every 6 (six) hours as needed for wheezing or shortness of breath. 08/05/16   Golden Circle, FNP  cholestyramine (QUESTRAN) 4 g packet Take 1 packet (4 g total) by mouth 2 (two) times daily. 12/14/16   Charlynne Cousins, MD  clobetasol cream (TEMOVATE) 4.97 % Apply 1 application topically 2 (two) times daily as needed (flare ups).    [provider]  cyclobenzaprine (FLEXERIL) 10 MG tablet Take 10 mg by mouth 3 (three) times daily as needed for muscle spasms.    [provider]  famotidine (PEPCID) 20 MG tablet Take 20 mg by mouth 2 (two) times daily.    [provider]  ferrous sulfate 325 (65 FE) MG  tablet Take 1 tablet (325 mg total) by mouth 2 (two) times daily with a meal. 10/27/16   Janece Canterbury, MD  Fluticasone-Salmeterol (ADVAIR DISKUS) 500-50 MCG/DOSE AEPB INHALE 1 PUFF INTO THE LUNGS TWICE A DAY 08/01/16   Golden Circle, FNP  folic acid (FOLVITE) 1 MG tablet Take 1 tablet (1 mg total) by mouth daily. 04/18/16   Ghimire, Henreitta Leber, MD  gabapentin (NEURONTIN) 100 MG capsule Take 100 mg by mouth 2 (two) times daily.    [provider]  Icosapent Ethyl (VASCEPA) 1 g CAPS Take 1 g by mouth 2 (two) times daily.    [provider]  loratadine (CLARITIN) 10 MG tablet Take 10 mg by mouth daily.    [provider]  LORazepam (ATIVAN) 1 MG tablet Take 1 tablet (1 mg total) by mouth 2 (two) times daily as needed for anxiety (Anxiety, shakiness. DO NOT USE WITH ALCOHOL.). 08/01/16  Golden Circle, FNP  magnesium oxide (MAG-OX) 400 MG tablet Take 1 tablet (400 mg total) by mouth 2 (two) times daily. 03/17/18   Eugenie Filler, MD  Multiple Vitamin (MULTIVITAMIN WITH MINERALS) TABS tablet Take 1 tablet by mouth daily. 04/18/16   Ghimire, Henreitta Leber, MD  naloxegol oxalate (MOVANTIK) 25 MG TABS tablet Take 25 mg by mouth daily.    [provider]  Oxycodone HCl 20 MG TABS Take 0.5 tablets (10 mg total) by mouth every 6 (six) hours as needed. 03/17/18   Eugenie Filler, MD  pantoprazole (PROTONIX) 40 MG tablet Take 1 tablet (40 mg total) by mouth daily. 03/17/18   Eugenie Filler, MD  polyethylene glycol [facility]) packet Take 17 g by mouth daily. 03/18/18   Eugenie Filler, MD  potassium chloride SA (K-DUR,KLOR-CON) 20 MEQ tablet Take 1 tablet (20 mEq total) by mouth daily. 08/05/16   Golden Circle, FNP  propranolol (INDERAL) 10 MG tablet Take 1 tablet (10 mg total) by mouth 2 (two) times daily. 10/27/16   Janece Canterbury, MD  senna-docusate (SENOKOT-S) 8.6-50 MG tablet Take 1 tablet by mouth 2 (two) times daily. 03/17/18   Eugenie Filler, MD    simvastatin (ZOCOR) 40 MG tablet Take 1 tablet (40 mg total) by mouth every morning. 08/05/16   Golden Circle, FNP  thiamine 100 MG tablet Take 1 tablet (100 mg total) by mouth daily. 04/18/16   Ghimire, Henreitta Leber, MD  traZODone (DESYREL) 50 MG tablet Take 1 tablet (50 mg total) by mouth at bedtime. 03/17/18   Eugenie Filler, MD    Family History Family History  Problem Relation Age of Onset  . Hyperlipidemia Mother   . Hypertension Mother     Social History Social History   Tobacco Use  . Smoking status: Current Some Day Smoker    Packs/day: 0.15    Years: 6.00    Pack years: 0.90    Types: Cigarettes  . Smokeless tobacco: Never Used  Substance Use Topics  . Alcohol use: Yes    Alcohol/week: 2.0 standard drinks    Types: 1 Glasses of wine, 1 Shots of liquor per week    Comment: 2-3 glasses of wine a day  . Drug use: No     Allergies   Azithromycin and Morphine and related   Review of Systems Review of Systems  Unable to perform ROS: Mental status change     Physical Exam Updated Vital Signs There were no vitals taken for this visit.  Physical Exam Vitals signs and nursing note reviewed.  Constitutional:      Appearance: She is well-developed.     Comments: Sickly appearing elderly female sitting upright on the edge of the bed, essentially noninteractive, though occasionally offering brief nods, brief minimally audible verbal responses  HENT:     Head: Normocephalic and atraumatic.  Eyes:     Conjunctiva/sclera: Conjunctivae normal.  Cardiovascular:     Rate and Rhythm: Normal rate and regular rhythm.     Pulses: Normal pulses.  Pulmonary:     Effort: Pulmonary effort is normal. No respiratory distress.     Breath sounds: Normal breath sounds. No stridor.  Abdominal:     General: There is no distension.  Skin:    General: Skin is warm and dry.  Neurological:     Motor: Atrophy present.     Comments: Patient does not participate in neurologic  exam, does move all extremities  minimally, spontaneously. No gross facial asymmetry.   Psychiatric:        Attention and Perception: She is inattentive.        Cognition and Memory: Cognition is impaired.      ED Treatments / Results  Labs (all labs ordered are listed, but only abnormal results are displayed) Labs Reviewed  URINE CULTURE  COMPREHENSIVE METABOLIC PANEL  CBC WITH DIFFERENTIAL/PLATELET  PROTIME-INR  URINALYSIS, ROUTINE W REFLEX MICROSCOPIC  URINALYSIS, COMPLETE (UACMP) WITH MICROSCOPIC  ETHANOL  CBG MONITORING, ED  I-STAT CG4 LACTIC ACID, ED    EKG None  Radiology No results found.  Procedures Procedures (including critical care time)  Medications Ordered in ED Medications  sodium chloride 0.9 % bolus 1,000 mL (has no administration in time range)    And  0.9 %  sodium chloride infusion (has no administration in time range)     Initial Impression / Assessment and Plan / ED Course  I have reviewed the triage vital signs and the nursing notes.  Pertinent labs & imaging results that were available during my care of the patient were reviewed by me and considered in my medical decision making (see chart for details).    Initial evaluation followed by chart reviewed, notable for opiate overdose requiring both boluses and drip of Narcan. This occurred earlier this year.   4:49 PM CT unremarkable w 2mg  narcan the patient awakens. She was not particularly bradypnea, nor hypoxic beforehand, but her somnolence has resolved. After a few moments of awakening, she notes that she did takes oxycodone earlier today, but otherwise has no recollection of events prior to ED arrival.  6:53 PM After a period of improved lucidity, the patient again had hypersomnolence, fumbling coordination, and decreased interactivity. A second dose of Narcan improved her cognition markedly.  This elderly female presents after episode of altered mental status.  Essentially  unresponsive on arrival. Patient required Narcan, twice for improved cognition. Notably, patient also found to have elevated creatinine, will beyond baseline, though without concurrent BUN elevation. Patient received fluid resuscitation in addition to her Narcan.  On head CT unremarkable, for acute new head pathology.  8:50 PM Patient now accompanied by her daughter. With a lengthy conversation about today's presentation Daughter notes that the patient has in the past abused her medication, as previously refused recommendations for cessation of narcotics for her chronic pain. I made this recommendation to the patient myself, patient will not receive additional prescriptions either. Patient also provided resources for outpatient substance abuse counseling.    Final Clinical Impressions(s) / ED Diagnoses  Stupor Overdose, initial encounter, accidental  CRITICAL CARE Performed by: Carmin Muskrat Total critical care time: 35 minutes Critical care time was exclusive of separately billable procedures and treating other patients. Critical care was necessary to treat or prevent imminent or life-threatening deterioration. Critical care was time spent personally by me on the following activities: development of treatment plan with patient and/or surrogate as well as nursing, discussions with consultants, evaluation of patient's response to treatment, examination of patient, obtaining history from patient or surrogate, ordering and performing treatments and interventions, ordering and review of laboratory studies, ordering and review of radiographic studies, pulse oximetry and re-evaluation of patient's condition.Carmin Muskrat, MD 10/31/18 Randol Kern    Carmin Muskrat, MD 10/31/18 2053

## 2018-10-31 NOTE — ED Notes (Signed)
The pts daughter is at bedside

## 2018-10-31 NOTE — Discharge Instructions (Addendum)
As discussed, it is important that you follow-up with your physician. Particularly important is that you discuss appropriate ways to stop using narcotics for chronic pain control. Another option is to pursue outpatient counseling.  Resources have been provided.  Return here for any concerning changes in your condition.

## 2018-10-31 NOTE — ED Notes (Signed)
The pt has finally able to talk to her friend on the telephone

## 2018-10-31 NOTE — ED Notes (Signed)
Narcan given iv  0.4iv pt woke up in a few minutes

## 2018-10-31 NOTE — ED Triage Notes (Signed)
The pt wass dropped off in triage  She was [place] by friends  Not responding to questions asked low 02 sats  bp low.  Not following directions

## 2018-10-31 NOTE — ED Notes (Signed)
Pt sleeping  Narcan given  To wake her up

## 2018-10-31 NOTE — ED Notes (Signed)
The pt was found sitting in church not verbally responding  A friend of the pt brought her to the hospital  She was brought back from triage not answering questions.  No following commands.  Confused and disoriented  bp low sats low  2liters nasal 02 applied.

## 2018-10-31 NOTE — ED Notes (Signed)
Returned from c-t  No change  Responds to loud noises and to pain

## 2018-10-31 NOTE — ED Notes (Signed)
CBG- 92 

## 2018-10-31 NOTE — ED Notes (Signed)
Pt eating she has talked to her daughter and her daughter is coming from 45 minutes away to take her home  She is alert   Still has intermittent periods of confusion

## 2018-10-31 NOTE — ED Notes (Signed)
Pt alert trying to get in touch with her friends and husbnad

## 2018-10-31 NOTE — ED Notes (Signed)
Pt alert and talking.

## 2018-11-01 LAB — URINE CULTURE
Culture: <10000 — AB
Special Requests: NORMAL

## 2018-11-26 DIAGNOSIS — G894 Chronic pain syndrome: Secondary | ICD-10-CM | POA: Diagnosis not present

## 2018-11-26 DIAGNOSIS — M545 Low back pain: Secondary | ICD-10-CM | POA: Diagnosis not present

## 2018-11-26 DIAGNOSIS — M79605 Pain in left leg: Secondary | ICD-10-CM | POA: Diagnosis not present

## 2018-11-26 DIAGNOSIS — Z79891 Long term (current) use of opiate analgesic: Secondary | ICD-10-CM | POA: Diagnosis not present

## 2018-12-10 DIAGNOSIS — G894 Chronic pain syndrome: Secondary | ICD-10-CM | POA: Diagnosis not present

## 2018-12-10 DIAGNOSIS — Z79891 Long term (current) use of opiate analgesic: Secondary | ICD-10-CM | POA: Diagnosis not present

## 2018-12-10 DIAGNOSIS — M79605 Pain in left leg: Secondary | ICD-10-CM | POA: Diagnosis not present

## 2018-12-10 DIAGNOSIS — M545 Low back pain: Secondary | ICD-10-CM | POA: Diagnosis not present

## 2019-01-30 ENCOUNTER — Emergency Department (HOSPITAL_COMMUNITY)
Admission: EM | Admit: 2019-01-30 | Discharge: 2019-01-31 | Disposition: A | Discharge status: Home / Self Care | Payer: Medicare HMO | Attending: Emergency Medicine | Admitting: Emergency Medicine

## 2019-01-30 ENCOUNTER — Other Ambulatory Visit: Payer: Self-pay

## 2019-01-30 ENCOUNTER — Encounter (HOSPITAL_COMMUNITY): Payer: Self-pay | Admitting: Emergency Medicine

## 2019-01-30 DIAGNOSIS — R1013 Epigastric pain: Secondary | ICD-10-CM | POA: Diagnosis not present

## 2019-01-30 DIAGNOSIS — F17210 Nicotine dependence, cigarettes, uncomplicated: Secondary | ICD-10-CM | POA: Diagnosis not present

## 2019-01-30 DIAGNOSIS — Z79899 Other long term (current) drug therapy: Secondary | ICD-10-CM | POA: Insufficient documentation

## 2019-01-30 DIAGNOSIS — J45909 Unspecified asthma, uncomplicated: Secondary | ICD-10-CM | POA: Diagnosis not present

## 2019-01-30 MED ORDER — SODIUM CHLORIDE 0.9 % IV BOLUS
500.0000 mL | Freq: Once | INTRAVENOUS | Status: AC
  Administered 2019-01-31: 500 mL via INTRAVENOUS

## 2019-01-30 MED ORDER — ONDANSETRON HCL 4 MG/2ML IJ SOLN
4.0000 mg | Freq: Once | INTRAMUSCULAR | Status: AC
  Administered 2019-01-31: 4 mg via INTRAVENOUS
  Filled 2019-01-30: qty 2

## 2019-01-30 NOTE — ED Provider Notes (Signed)
[facility] DEPT Provider Note   CSN: [id number] Arrival date & time: 01/30/19  2320    History   Chief Complaint Chief Complaint  Patient presents with  . Abdominal Pain    HPI [name] is a 73 y.o. female.     The history is provided by the patient and medical records.  Abdominal Pain  Associated symptoms: diarrhea, nausea and vomiting      73 y.o. F with history of alcohol abuse, arthritis, asthma, GERD, chronic pain, HLP, seasonal allergies, presenting to the ED for abdominal pain.  Reports for 2 days now she has had epigastric abdominal pain, nausea, vomiting, and diarrhea.  She denies fever.  States she last threw up chicken and noodles.  No blood in emesis or stool.  She denies sick contacts, recent travel, abnormal food intake.  She thinks she is on medications for "her bladder" but denies taking any recent pain medications.  Denies alcohol abuse.    Past Medical History:  Diagnosis Date  . Alcohol abuse   . Alcohol abuse   . Allergy   . Arthritis    back-severe, hips, right knee  . Asthma   . GERD (gastroesophageal reflux disease)    occasional  . H/O measles   . H/O mumps   . Hypercholesteremia    under control  . Insomnia   . Psoriasis (a type of skin inflammation)   . Renal cell carcinoma 2012   left  . Seasonal allergies     Patient Active Problem List   Diagnosis Date Noted  . Chronic pain syndrome   . Hypomagnesemia   . Blood pressure elevated without history of HTN   . Opiate overdose ([place])   . Hypokalemia 03/14/2018  . AKI (acute kidney injury) ([place]) 03/14/2018  . Opioid overdose ([place]) 03/14/2018  . Opioid abuse ([place]) 03/14/2018  . C. difficile colitis 12/13/2016  . Alcohol withdrawal with delirium in inpatient treatment ([place])   . Acute alcohol abuse, with unspecified complication ([place])   . Tachypnea   . UGI bleed 12/07/2016  . Acute upper GI bleed 10/25/2016  . Varices of esophagus determined by  endoscopy ([place]) 10/25/2016  . Displaced oblique fracture of shaft of left femur ([place]) 04/13/2016  . Acute upper GI bleeding 04/05/2016  . Upper GI bleed 04/05/2016  . Rotator cuff tendinitis 03/11/2016  . Alcohol withdrawal ([place]) 01/04/2016  . Acute blood loss anemia 01/04/2016  . Tobacco abuse 01/03/2016  . Diarrhea 01/03/2016  . Depression 01/03/2016  . Alcohol abuse   . Chronic back pain 08/18/2015  . Seasonal allergies 07/05/2015  . Insomnia 07/05/2015  . Arthritis 07/05/2015  . Psoriasis 07/05/2015  . Menopausal hot flushes 06/01/2015  . Dyspareunia 06/01/2015  . Multiple falls 05/03/2015  . Encounter for medication review 05/03/2015  . Hot flashes 05/03/2015  . Herniated lumbar intervertebral disc 10/12/2014  . Generalized abdominal pain 09/25/2013  . Anemia of chronic disease 09/11/2013  . Asthma 04/24/2007  . Hyperlipidemia 01/04/2007    Past Surgical History:  Procedure Laterality Date  . ABDOMINAL HYSTERECTOMY  40years ago  . BUNIONECTOMY  04/2011  . CHOLECYSTECTOMY  11/13/2011   Procedure: LAPAROSCOPIC CHOLECYSTECTOMY WITH INTRAOPERATIVE CHOLANGIOGRAM;  Surgeon: Judieth Keens, DO;  Location: [facility];  Service: General;  Laterality: N/A;  . COLONOSCOPY N/A 09/25/2013   Procedure: COLONOSCOPY;  Surgeon: Lear Ng, MD;  Location: [facility];  Service: Endoscopy;  Laterality: N/A;  . ESOPHAGOGASTRODUODENOSCOPY N/A 09/25/2013   Procedure: ESOPHAGOGASTRODUODENOSCOPY (  EGD);  Surgeon: Lear Ng, MD;  Location: [facility];  Service: Endoscopy;  Laterality: N/A;  . ESOPHAGOGASTRODUODENOSCOPY N/A 04/07/2016   Procedure: ESOPHAGOGASTRODUODENOSCOPY (EGD);  Surgeon: Milus Banister, MD;  Location: [facility];  Service: Endoscopy;  Laterality: N/A;  . ESOPHAGOGASTRODUODENOSCOPY N/A 10/26/2016   Procedure: ESOPHAGOGASTRODUODENOSCOPY (EGD);  Surgeon: Arta Silence, MD;  Location: [facility];  Service: Endoscopy;  Laterality: N/A;  .  ESOPHAGOGASTRODUODENOSCOPY N/A 12/07/2016   Procedure: ESOPHAGOGASTRODUODENOSCOPY (EGD);  Surgeon: Wonda Horner, MD;  Location: [facility];  Service: Endoscopy;  Laterality: N/A;  . ESOPHAGOGASTRODUODENOSCOPY (EGD) WITH PROPOFOL Left 01/03/2016   Procedure: ESOPHAGOGASTRODUODENOSCOPY (EGD) WITH PROPOFOL;  Surgeon: Arta Silence, MD;  Location: [facility];  Service: Endoscopy;  Laterality: Left;  . FEMUR IM NAIL Left 04/15/2016   Procedure: INTRAMEDULLARY (IM) RETROGRADE FEMORAL NAILING;  Surgeon: Gaynelle Arabian, MD;  Location: [facility];  Service: Orthopedics;  Laterality: Left;  . HERNIA REPAIR  03/4655   supraumbilical repair  . KIDNEY SURGERY  12/2010   [facility]; partial nephrectomy  . LUMBAR LAMINECTOMY/DECOMPRESSION MICRODISCECTOMY Right 10/12/2014   Procedure: HEMI LAMINECTOMY MICRODISCECTOMY L5-S1 RIGHT (1 LEVEL);  Surgeon: Tobi Bastos, MD;  Location: [facility];  Service: Orthopedics;  Laterality: Right;  . MENISECTOMY  2010   left knee  . TUBAL LIGATION  44 years ago     OB History    Gravida  3   Para  3   Term      Preterm      AB      Living  3     SAB      TAB      Ectopic      Multiple      Live Births               Home Medications    Prior to Admission medications   Medication Sig Start Date End Date Taking? Authorizing Provider  albuterol (PROVENTIL HFA;VENTOLIN HFA) 108 (90 Base) MCG/ACT inhaler Inhale 2 puffs into the lungs every 6 (six) hours as needed for wheezing or shortness of breath. 08/05/16   Golden Circle, FNP  cholestyramine (QUESTRAN) 4 g packet Take 1 packet (4 g total) by mouth 2 (two) times daily. 12/14/16   Charlynne Cousins, MD  clobetasol cream (TEMOVATE) 8.12 % Apply 1 application topically 2 (two) times daily as needed (flare ups).    [provider]  cyclobenzaprine (FLEXERIL) 10 MG tablet Take 10 mg by mouth 3 (three) times daily as needed for muscle spasms.    [provider]  famotidine (PEPCID) 20 MG  tablet Take 20 mg by mouth 2 (two) times daily.    [provider]  ferrous sulfate 325 (65 FE) MG tablet Take 1 tablet (325 mg total) by mouth 2 (two) times daily with a meal. 10/27/16   Janece Canterbury, MD  Fluticasone-Salmeterol (ADVAIR DISKUS) 500-50 MCG/DOSE AEPB INHALE 1 PUFF INTO THE LUNGS TWICE A DAY 08/01/16   Golden Circle, FNP  folic acid (FOLVITE) 1 MG tablet Take 1 tablet (1 mg total) by mouth daily. 04/18/16   Ghimire, Henreitta Leber, MD  gabapentin (NEURONTIN) 100 MG capsule Take 100 mg by mouth 2 (two) times daily.    [provider]  Icosapent Ethyl (VASCEPA) 1 g CAPS Take 1 g by mouth 2 (two) times daily.    [provider]  loratadine (CLARITIN) 10 MG tablet Take 10 mg by mouth daily.    [provider]  LORazepam (  ATIVAN) 1 MG tablet Take 1 tablet (1 mg total) by mouth 2 (two) times daily as needed for anxiety (Anxiety, shakiness. DO NOT USE WITH ALCOHOL.). 08/01/16   Golden Circle, FNP  magnesium oxide (MAG-OX) 400 MG tablet Take 1 tablet (400 mg total) by mouth 2 (two) times daily. 03/17/18   Eugenie Filler, MD  Multiple Vitamin (MULTIVITAMIN WITH MINERALS) TABS tablet Take 1 tablet by mouth daily. 04/18/16   Ghimire, Henreitta Leber, MD  naloxegol oxalate (MOVANTIK) 25 MG TABS tablet Take 25 mg by mouth daily.    [provider]  Oxycodone HCl 20 MG TABS Take 0.5 tablets (10 mg total) by mouth every 6 (six) hours as needed. 03/17/18   Eugenie Filler, MD  pantoprazole (PROTONIX) 40 MG tablet Take 1 tablet (40 mg total) by mouth daily. 03/17/18   Eugenie Filler, MD  polyethylene glycol [facility] / Floria Raveling) packet Take 17 g by mouth daily. 03/18/18   Eugenie Filler, MD  potassium chloride SA (K-DUR,KLOR-CON) 20 MEQ tablet Take 1 tablet (20 mEq total) by mouth daily. 08/05/16   Golden Circle, FNP  propranolol (INDERAL) 10 MG tablet Take 1 tablet (10 mg total) by mouth 2 (two) times daily. 10/27/16   Janece Canterbury, MD   senna-docusate (SENOKOT-S) 8.6-50 MG tablet Take 1 tablet by mouth 2 (two) times daily. 03/17/18   Eugenie Filler, MD  simvastatin (ZOCOR) 40 MG tablet Take 1 tablet (40 mg total) by mouth every morning. 08/05/16   Golden Circle, FNP  thiamine 100 MG tablet Take 1 tablet (100 mg total) by mouth daily. 04/18/16   Ghimire, Henreitta Leber, MD  traZODone (DESYREL) 50 MG tablet Take 1 tablet (50 mg total) by mouth at bedtime. 03/17/18   Eugenie Filler, MD    Family History Family History  Problem Relation Age of Onset  . Hyperlipidemia Mother   . Hypertension Mother     Social History Social History   Tobacco Use  . Smoking status: Current Some Day Smoker    Packs/day: 0.15    Years: 6.00    Pack years: 0.90    Types: Cigarettes  . Smokeless tobacco: Never Used  Substance Use Topics  . Alcohol use: Yes    Alcohol/week: 2.0 standard drinks    Types: 1 Glasses of wine, 1 Shots of liquor per week    Comment: 2-3 glasses of wine a day  . Drug use: No     Allergies   Azithromycin and Morphine and related   Review of Systems Review of Systems  Gastrointestinal: Positive for abdominal pain, diarrhea, nausea and vomiting.  All other systems reviewed and are negative.    Physical Exam Updated Vital Signs BP (!) 161/85 (BP Location: Right Arm)   Pulse (!) 104   Temp 99 F (37.2 C) (Oral)   Resp 20   Ht 5' 4" (1.626 m)   Wt 73.5 kg   SpO2 100%   BMI 27.81 kg/m   Physical Exam Vitals signs and nursing note reviewed.  Constitutional:      Appearance: She is well-developed.     Comments: Moaning, yelling, and intermittently flailing around in the bed but this is easily distractible  HENT:     Head: Normocephalic and atraumatic.  Eyes:     Conjunctiva/sclera: Conjunctivae normal.     Pupils: Pupils are equal, round, and reactive to light.  Neck:     Musculoskeletal: Normal range of motion.  Cardiovascular:  Rate and Rhythm: Normal rate and regular rhythm.      Heart sounds: Normal heart sounds.  Pulmonary:     Effort: Pulmonary effort is normal.     Breath sounds: Normal breath sounds.  Abdominal:     General: Bowel sounds are normal.     Palpations: Abdomen is soft.     Tenderness: Negative signs include psoas sign.     Comments: Points to epigastrium as area of pain but no focal tenderness; soft without distention, normal bowel sounds  Musculoskeletal: Normal range of motion.  Skin:    General: Skin is warm and dry.  Neurological:     Mental Status: She is alert and oriented to person, place, and time.      ED Treatments / Results  Labs (all labs ordered are listed, but only abnormal results are displayed) Labs Reviewed  CBC WITH DIFFERENTIAL/PLATELET - Abnormal; Notable for the following components:      Result Value   RDW 16.7 (*)    Platelets 456 (*)    All other components within normal limits  COMPREHENSIVE METABOLIC PANEL - Abnormal; Notable for the following components:   Sodium 134 (*)    Potassium 2.7 (*)    Chloride 92 (*)    Glucose, Bld 105 (*)    Calcium 8.7 (*)    Alkaline Phosphatase 257 (*)    All other components within normal limits  RAPID URINE DRUG SCREEN, HOSP PERFORMED - Abnormal; Notable for the following components:   Tetrahydrocannabinol POSITIVE (*)    All other components within normal limits  URINALYSIS, ROUTINE W REFLEX MICROSCOPIC - Abnormal; Notable for the following components:   Leukocytes,Ua TRACE (*)    Bacteria, UA RARE (*)    All other components within normal limits  ETHANOL  LIPASE, BLOOD  MAGNESIUM    EKG None  Radiology No results found.  Procedures Procedures (including critical care time)  Medications Ordered in ED Medications  ondansetron (ZOFRAN) injection 4 mg (4 mg Intravenous Given 01/31/19 0030)  sodium chloride 0.9 % bolus 500 mL (0 mLs Intravenous Stopped 01/31/19 0050)  potassium chloride SA (K-DUR,KLOR-CON) CR tablet 40 mEq (40 mEq Oral Given 01/31/19 0136)   potassium chloride 10 mEq in 100 mL IVPB (0 mEq Intravenous Stopped 01/31/19 0243)  alum & mag hydroxide-simeth (MAALOX/MYLANTA) 200-200-20 MG/5ML suspension 30 mL (30 mLs Oral Given 01/31/19 0149)    And  lidocaine (XYLOCAINE) 2 % viscous mouth solution 15 mL (15 mLs Oral Given 01/31/19 0149)     Initial Impression / Assessment and Plan / ED Course  I have reviewed the triage vital signs and the nursing notes.  Pertinent labs & imaging results that were available during my care of the patient were reviewed by me and considered in my medical decision making (see chart for details).  73 year old female presenting to the ED with epigastric pain.  Has history of chronic pain, opioid addiction, and unfortunately multiple instances of opioid overdose.  She is afebrile and nontoxic in appearance here.  She is yelling, moaning, and flailing around intermittently during exam, however vitals are stable she does not appear to be in any actual distress.  She points to her epigastrium as area of pain and continues moaning but this is distractible.  I do not perceive any actual tenderness to palpation, her abdomen is not distended and she does not have any peritoneal signs.  Her bowel sounds are normal.  We will plan for screening labs, urinalysis, as well as  drug screen.  She is given IV fluids as well as Zofran.  While awaiting labs, patient was observed to be walking up and down the hall asking for the nurse.  She was assisted back to her room and a few minutes later she was found to be sticking her finger down her throat to induce vomiting.  We will continue to monitor.  Patient's labs as above, overall reassuring aside from potassium of 2.7.  Magnesium is normal.  Alk phos elevated, however this appears chronic based on values over the past 2 years on chart review.  Lipase WNL.  Her drug screen is positive for THC, however negative for opiates which she is prescribed.  UA does not show any signs of infection.   Will give IV and p.o. potassium.  2:46 AM Patient's IV potassium has finished. She tolerated oral K+ without issue along with GI cocktail. She is currently asleep in the room.  She has not had any active emesis here aside from sticking her finger down her throat to induce vomiting.  No diarrhea.   I have low suspicion for acute abdominal pathology at this time.  Feel she is stable for discharge home with continued symptomatic care.  She can follow-up with her PCP.  Return here for any new/acute changes.  Final Clinical Impressions(s) / ED Diagnoses   Final diagnoses:  Epigastric pain    ED Discharge Orders         Ordered    ondansetron (ZOFRAN ODT) 4 MG disintegrating tablet  Every 8 hours PRN     01/31/19 0247           Larene Pickett, PA-C 01/31/19 0301    Mesner, Corene Cornea, MD 01/31/19 [phone]

## 2019-01-30 NOTE — ED Triage Notes (Signed)
Pt reports nausea, vomiting, and abdominal pain for the last 2 days.

## 2019-01-31 LAB — RAPID URINE DRUG SCREEN, HOSP PERFORMED
Amphetamines: NOT DETECTED
BENZODIAZEPINES: NOT DETECTED
Barbiturates: NOT DETECTED
Cocaine: NOT DETECTED
Opiates: NOT DETECTED
Tetrahydrocannabinol: POSITIVE — AB

## 2019-01-31 LAB — COMPREHENSIVE METABOLIC PANEL
ALT: 17 U/L (ref 0–44)
AST: 36 U/L (ref 15–41)
Albumin: 3.7 g/dL (ref 3.5–5.0)
Alkaline Phosphatase: 257 U/L — ABNORMAL HIGH (ref 38–126)
Anion gap: 15 (ref 5–15)
BUN: 9 mg/dL (ref 8–23)
CHLORIDE: 92 mmol/L — AB (ref 98–111)
CO2: 27 mmol/L (ref 22–32)
Calcium: 8.7 mg/dL — ABNORMAL LOW (ref 8.9–10.3)
Creatinine, Ser: 0.88 mg/dL (ref 0.44–1.00)
GFR calc Af Amer: >60 mL/min (ref >60)
Glucose, Bld: 105 mg/dL — ABNORMAL HIGH (ref 70–99)
Potassium: 2.7 mmol/L — CL (ref 3.5–5.1)
Sodium: 134 mmol/L — ABNORMAL LOW (ref 135–145)
Total Bilirubin: 0.7 mg/dL (ref 0.3–1.2)
Total Protein: 7.7 g/dL (ref 6.5–8.1)

## 2019-01-31 LAB — URINALYSIS, ROUTINE W REFLEX MICROSCOPIC
Bilirubin Urine: NEGATIVE
Glucose, UA: NEGATIVE mg/dL
Hgb urine dipstick: NEGATIVE
Ketones, ur: NEGATIVE mg/dL
Nitrite: NEGATIVE
PH: 7 (ref 5.0–8.0)
Protein, ur: NEGATIVE mg/dL
Specific Gravity, Urine: 1.012 (ref 1.005–1.030)

## 2019-01-31 LAB — CBC WITH DIFFERENTIAL/PLATELET
Abs Immature Granulocytes: 0.02 10*3/uL (ref 0.00–0.07)
Basophils Absolute: 0.1 10*3/uL (ref 0.0–0.1)
Basophils Relative: 1 %
EOS ABS: 0.1 10*3/uL (ref 0.0–0.5)
Eosinophils Relative: 1 %
HCT: 38.8 % (ref 36.0–46.0)
Hemoglobin: 12.8 g/dL (ref 12.0–15.0)
IMMATURE GRANULOCYTES: 0 %
Lymphocytes Relative: 30 %
Lymphs Abs: 2.7 10*3/uL (ref 0.7–4.0)
MCH: 30.6 pg (ref 26.0–34.0)
MCHC: 33 g/dL (ref 30.0–36.0)
MCV: 92.8 fL (ref 80.0–100.0)
Monocytes Absolute: 0.7 10*3/uL (ref 0.1–1.0)
Monocytes Relative: 8 %
Neutro Abs: 5.4 10*3/uL (ref 1.7–7.7)
Neutrophils Relative %: 60 %
PLATELETS: 456 10*3/uL — AB (ref 150–400)
RBC: 4.18 MIL/uL (ref 3.87–5.11)
RDW: 16.7 % — AB (ref 11.5–15.5)
WBC: 8.9 10*3/uL (ref 4.0–10.5)
nRBC: 0 % (ref 0.0–0.2)

## 2019-01-31 LAB — MAGNESIUM: Magnesium: 1.8 mg/dL (ref 1.7–2.4)

## 2019-01-31 LAB — LIPASE, BLOOD: LIPASE: 26 U/L (ref 11–51)

## 2019-01-31 LAB — ETHANOL

## 2019-01-31 MED ORDER — ONDANSETRON 4 MG PO TBDP: 4.0000 mg | ORAL_TABLET | Freq: Three times a day (TID) | ORAL | 0 refills | Status: DC | PRN

## 2019-01-31 MED ORDER — ALUM & MAG HYDROXIDE-SIMETH 200-200-20 MG/5ML PO SUSP
30.0000 mL | Freq: Once | ORAL | Status: AC
  Administered 2019-01-31: 30 mL via ORAL
  Filled 2019-01-31: qty 30

## 2019-01-31 MED ORDER — POTASSIUM CHLORIDE 10 MEQ/100ML IV SOLN
10.0000 meq | Freq: Once | INTRAVENOUS | Status: AC
  Administered 2019-01-31: 10 meq via INTRAVENOUS
  Filled 2019-01-31: qty 100

## 2019-01-31 MED ORDER — POTASSIUM CHLORIDE CRYS ER 20 MEQ PO TBCR
40.0000 meq | EXTENDED_RELEASE_TABLET | Freq: Once | ORAL | Status: AC
  Administered 2019-01-31: 40 meq via ORAL
  Filled 2019-01-31: qty 2

## 2019-01-31 MED ORDER — LIDOCAINE VISCOUS HCL 2 % MT SOLN
15.0000 mL | Freq: Once | OROMUCOSAL | Status: AC
  Administered 2019-01-31: 15 mL via ORAL
  Filled 2019-01-31: qty 15

## 2019-01-31 NOTE — ED Notes (Addendum)
Pt observed with finger down throat to induce vomitting

## 2019-01-31 NOTE — ED Notes (Addendum)
Pt in hallway asking for nurse

## 2019-01-31 NOTE — Discharge Instructions (Signed)
Take the prescribed medication as directed.  Continue drinking fluids, eating small/gentle meals for now and progress as tolerated. Follow-up with your primary care doctor. Return to the ED for new or worsening symptoms.

## 2019-03-19 ENCOUNTER — Emergency Department (HOSPITAL_COMMUNITY): Payer: Medicare PPO

## 2019-03-19 ENCOUNTER — Emergency Department (HOSPITAL_COMMUNITY)
Admission: EM | Admit: 2019-03-19 | Discharge: 2019-03-19 | Disposition: A | Discharge status: Home / Self Care | Payer: Medicare PPO | Attending: Emergency Medicine | Admitting: Emergency Medicine

## 2019-03-19 ENCOUNTER — Other Ambulatory Visit: Payer: Self-pay

## 2019-03-19 ENCOUNTER — Encounter (HOSPITAL_COMMUNITY): Payer: Self-pay

## 2019-03-19 DIAGNOSIS — Z79899 Other long term (current) drug therapy: Secondary | ICD-10-CM | POA: Insufficient documentation

## 2019-03-19 DIAGNOSIS — F17210 Nicotine dependence, cigarettes, uncomplicated: Secondary | ICD-10-CM | POA: Diagnosis not present

## 2019-03-19 DIAGNOSIS — R079 Chest pain, unspecified: Secondary | ICD-10-CM | POA: Diagnosis present

## 2019-03-19 DIAGNOSIS — R0789 Other chest pain: Secondary | ICD-10-CM | POA: Insufficient documentation

## 2019-03-19 DIAGNOSIS — J45909 Unspecified asthma, uncomplicated: Secondary | ICD-10-CM | POA: Diagnosis not present

## 2019-03-19 LAB — CBC WITH DIFFERENTIAL/PLATELET
Abs Immature Granulocytes: 0.04 10*3/uL (ref 0.00–0.07)
Basophils Absolute: 0.1 10*3/uL (ref 0.0–0.1)
Basophils Relative: 1 %
Eosinophils Absolute: 0.6 10*3/uL — ABNORMAL HIGH (ref 0.0–0.5)
Eosinophils Relative: 9 %
HCT: 37.6 % (ref 36.0–46.0)
Hemoglobin: 11.6 g/dL — ABNORMAL LOW (ref 12.0–15.0)
Immature Granulocytes: 1 %
Lymphocytes Relative: 25 %
Lymphs Abs: 1.9 10*3/uL (ref 0.7–4.0)
MCH: 30.4 pg (ref 26.0–34.0)
MCHC: 30.9 g/dL (ref 30.0–36.0)
MCV: 98.4 fL (ref 80.0–100.0)
Monocytes Absolute: 0.6 10*3/uL (ref 0.1–1.0)
Monocytes Relative: 8 %
Neutro Abs: 4.2 10*3/uL (ref 1.7–7.7)
Neutrophils Relative %: 56 %
Platelets: 422 10*3/uL — ABNORMAL HIGH (ref 150–400)
RBC: 3.82 MIL/uL — ABNORMAL LOW (ref 3.87–5.11)
RDW: 17.2 % — ABNORMAL HIGH (ref 11.5–15.5)
WBC: 7.4 10*3/uL (ref 4.0–10.5)
nRBC: 0 % (ref 0.0–0.2)

## 2019-03-19 LAB — BASIC METABOLIC PANEL
Anion gap: 9 (ref 5–15)
BUN: 8 mg/dL (ref 8–23)
CO2: 28 mmol/L (ref 22–32)
Calcium: 8.8 mg/dL — ABNORMAL LOW (ref 8.9–10.3)
Chloride: 102 mmol/L (ref 98–111)
Creatinine, Ser: 0.74 mg/dL (ref 0.44–1.00)
GFR calc Af Amer: >60 mL/min (ref >60)
GFR calc non Af Amer: >60 mL/min (ref >60)
Glucose, Bld: 107 mg/dL — ABNORMAL HIGH (ref 70–99)
Potassium: 3.5 mmol/L (ref 3.5–5.1)
Sodium: 139 mmol/L (ref 135–145)

## 2019-03-19 LAB — SEDIMENTATION RATE: Sed Rate: 32 mm/hr — ABNORMAL HIGH (ref 0–22)

## 2019-03-19 LAB — TROPONIN I: Troponin I: <0.03 ng/mL (ref <0.03)

## 2019-03-19 NOTE — ED Notes (Signed)
Patient transported to X-ray 

## 2019-03-19 NOTE — ED Triage Notes (Signed)
Pt c/o of CP that started Tuesday and "swelling in chest". Pt called PCP who recommended her to come to the ED if symptoms worsen. Pt denies SHOB, N/V.

## 2019-03-19 NOTE — Discharge Instructions (Addendum)
The testing today did not show any serious problems with your heart or lungs.  Your pain is most likely related to inflammation of the chest wall.  To treat this use heat on the sore area 4 times a day for 1 hour.  Also, for pain, use Tylenol 650 mg every 4 hours.  Follow-up with your primary care doctor as needed, for problems.

## 2019-03-19 NOTE — ED Provider Notes (Signed)
[facility] DEPT Provider Note   CSN: [id number] Arrival date & time: 03/19/19  0636    History   Chief Complaint Chief Complaint  Patient presents with  . Chest Pain    HPI [name] is a 73 y.o. female.   The history is provided by the patient.  She has history of hyperlipidemia, upper GI bleed, alcohol abuse and comes in because of pain in the upper sternal area for the last 3 days.  Pain is constant and sharp and she rates it at 8/10.  Pain has been getting worse.  It is worse with certain movements.  There is no associated dyspnea, nausea, diaphoresis.  She does have a slight cough productive of some gray sputum.  She denies fever or chills.  She denies any exposure to COVID-19.  She does smoke 1/4 pack of cigarettes a day.  She denies history of hypertension or diabetes and denies family history of premature coronary atherosclerosis.  She has taken some pain medication for this with temporary relief.  Past Medical History:  Diagnosis Date  . Alcohol abuse   . Alcohol abuse   . Allergy   . Arthritis    back-severe, hips, right knee  . Asthma   . GERD (gastroesophageal reflux disease)    occasional  . H/O measles   . H/O mumps   . Hypercholesteremia    under control  . Insomnia   . Psoriasis (a type of skin inflammation)   . Renal cell carcinoma 2012   left  . Seasonal allergies     Patient Active Problem List   Diagnosis Date Noted  . Chronic pain syndrome   . Hypomagnesemia   . Blood pressure elevated without history of HTN   . Opiate overdose ([place])   . Hypokalemia 03/14/2018  . AKI (acute kidney injury) ([place]) 03/14/2018  . Opioid overdose ([place]) 03/14/2018  . Opioid abuse ([place]) 03/14/2018  . C. difficile colitis 12/13/2016  . Alcohol withdrawal with delirium in inpatient treatment ([place])   . Acute alcohol abuse, with unspecified complication ([place])   . Tachypnea   . UGI bleed 12/07/2016  . Acute upper GI bleed 10/25/2016  .  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 Displaced oblique fracture of shaft of left femur ([place]) 04/13/2016  . Acute upper GI bleeding 04/05/2016  . Upper GI bleed 04/05/2016  . Rotator cuff tendinitis 03/11/2016  . Alcohol withdrawal ([place]) 01/04/2016  . Acute blood loss anemia 01/04/2016  . Tobacco abuse 01/03/2016  . Diarrhea 01/03/2016  . Depression 01/03/2016  . Alcohol abuse   . Chronic back pain 08/18/2015  . Seasonal allergies 07/05/2015  . Insomnia 07/05/2015  . Arthritis 07/05/2015  . Psoriasis 07/05/2015  . Menopausal hot flushes 06/01/2015  . Dyspareunia 06/01/2015  . Multiple falls 05/03/2015  . Encounter for medication review 05/03/2015  . Hot flashes 05/03/2015  . Herniated lumbar intervertebral disc 10/12/2014  . Generalized abdominal pain 09/25/2013  . Anemia of chronic disease 09/11/2013  . Asthma 04/24/2007  . Hyperlipidemia 01/04/2007    Past Surgical History:  Procedure Laterality Date  . ABDOMINAL HYSTERECTOMY  40years ago  . BUNIONECTOMY  04/2011  . CHOLECYSTECTOMY  11/13/2011   Procedure: LAPAROSCOPIC CHOLECYSTECTOMY WITH INTRAOPERATIVE CHOLANGIOGRAM;  Surgeon: Judieth Keens, DO;  Location: [facility];  Service: General;  Laterality: N/A;  . COLONOSCOPY N/A 09/25/2013   Procedure: COLONOSCOPY;  Surgeon: Lear Ng, MD;  Location: [facility];  Service: Endoscopy;  Laterality: N/A;  . ESOPHAGOGASTRODUODENOSCOPY N/A 09/25/2013   Procedure: ESOPHAGOGASTRODUODENOSCOPY (EGD);  Surgeon: Lear Ng, MD;  Location: [facility];  Service: Endoscopy;  Laterality: N/A;  . ESOPHAGOGASTRODUODENOSCOPY N/A 04/07/2016   Procedure: ESOPHAGOGASTRODUODENOSCOPY (EGD);  Surgeon: Milus Banister, MD;  Location: [facility];  Service: Endoscopy;  Laterality: N/A;  . ESOPHAGOGASTRODUODENOSCOPY N/A 10/26/2016   Procedure: ESOPHAGOGASTRODUODENOSCOPY (EGD);  Surgeon: Arta Silence, MD;  Location: [facility];  Service: Endoscopy;   Laterality: N/A;  . ESOPHAGOGASTRODUODENOSCOPY N/A 12/07/2016   Procedure: ESOPHAGOGASTRODUODENOSCOPY (EGD);  Surgeon: Wonda Horner, MD;  Location: [facility];  Service: Endoscopy;  Laterality: N/A;  . ESOPHAGOGASTRODUODENOSCOPY (EGD) WITH PROPOFOL Left 01/03/2016   Procedure: ESOPHAGOGASTRODUODENOSCOPY (EGD) WITH PROPOFOL;  Surgeon: Arta Silence, MD;  Location: [facility];  Service: Endoscopy;  Laterality: Left;  . FEMUR IM NAIL Left 04/15/2016   Procedure: INTRAMEDULLARY (IM) RETROGRADE FEMORAL NAILING;  Surgeon: Gaynelle Arabian, MD;  Location: [facility];  Service: Orthopedics;  Laterality: Left;  . HERNIA REPAIR  11/6107   supraumbilical repair  . KIDNEY SURGERY  12/2010   [facility]; partial nephrectomy  . LUMBAR LAMINECTOMY/DECOMPRESSION MICRODISCECTOMY Right 10/12/2014   Procedure: HEMI LAMINECTOMY MICRODISCECTOMY L5-S1 RIGHT (1 LEVEL);  Surgeon: Tobi Bastos, MD;  Location: [facility];  Service: Orthopedics;  Laterality: Right;  . MENISECTOMY  2010   left knee  . TUBAL LIGATION  44 years ago     OB History    Gravida  3   Para  3   Term      Preterm      AB      Living  3     SAB      TAB      Ectopic      Multiple      Live Births               Home Medications    Prior to Admission medications   Medication Sig Start Date End Date Taking? Authorizing Provider  albuterol (PROVENTIL HFA;VENTOLIN HFA) 108 (90 Base) MCG/ACT inhaler Inhale 2 puffs into the lungs every 6 (six) hours as needed for wheezing or shortness of breath. 08/05/16   Golden Circle, FNP  cholestyramine (QUESTRAN) 4 g packet Take 1 packet (4 g total) by mouth 2 (two) times daily. 12/14/16   Charlynne Cousins, MD  clobetasol cream (TEMOVATE) 6.04 % Apply 1 application topically 2 (two) times daily as needed (flare ups).    [provider]  cyclobenzaprine (FLEXERIL) 10 MG tablet Take 10 mg by mouth 3 (three) times daily as needed for muscle spasms.    [provider]   famotidine (PEPCID) 20 MG tablet Take 20 mg by mouth 2 (two) times daily.    [provider]  ferrous sulfate 325 (65 FE) MG tablet Take 1 tablet (325 mg total) by mouth 2 (two) times daily with a meal. 10/27/16   Janece Canterbury, MD  Fluticasone-Salmeterol (ADVAIR DISKUS) 500-50 MCG/DOSE AEPB INHALE 1 PUFF INTO THE LUNGS TWICE A DAY 08/01/16   Golden Circle, FNP  folic acid (FOLVITE) 1 MG tablet Take 1 tablet (1 mg total) by mouth daily. 04/18/16   Ghimire, Henreitta Leber, MD  gabapentin (NEURONTIN) 100 MG capsule Take 100 mg by mouth 2 (two) times daily.    [provider]  Icosapent Ethyl (VASCEPA) 1 g CAPS Take 1 g by mouth 2 (two) times daily.    [provider]  loratadine (CLARITIN) 10 MG tablet Take 10 mg  by mouth daily.    [provider]  LORazepam (ATIVAN) 1 MG tablet Take 1 tablet (1 mg total) by mouth 2 (two) times daily as needed for anxiety (Anxiety, shakiness. DO NOT USE WITH ALCOHOL.). 08/01/16   Golden Circle, FNP  magnesium oxide (MAG-OX) 400 MG tablet Take 1 tablet (400 mg total) by mouth 2 (two) times daily. 03/17/18   Eugenie Filler, MD  Multiple Vitamin (MULTIVITAMIN WITH MINERALS) TABS tablet Take 1 tablet by mouth daily. 04/18/16   Ghimire, Henreitta Leber, MD  naloxegol oxalate (MOVANTIK) 25 MG TABS tablet Take 25 mg by mouth daily.    [provider]  ondansetron (ZOFRAN ODT) 4 MG disintegrating tablet Take 1 tablet (4 mg total) by mouth every 8 (eight) hours as needed for nausea. 01/31/19   Larene Pickett, PA-C  Oxycodone HCl 20 MG TABS Take 0.5 tablets (10 mg total) by mouth every 6 (six) hours as needed. 03/17/18   Eugenie Filler, MD  pantoprazole (PROTONIX) 40 MG tablet Take 1 tablet (40 mg total) by mouth daily. 03/17/18   Eugenie Filler, MD  polyethylene glycol [facility] / Floria Raveling) packet Take 17 g by mouth daily. 03/18/18   Eugenie Filler, MD  potassium chloride SA (K-DUR,KLOR-CON) 20 MEQ tablet Take 1 tablet (20 mEq  total) by mouth daily. 08/05/16   Golden Circle, FNP  propranolol (INDERAL) 10 MG tablet Take 1 tablet (10 mg total) by mouth 2 (two) times daily. 10/27/16   Janece Canterbury, MD  senna-docusate (SENOKOT-S) 8.6-50 MG tablet Take 1 tablet by mouth 2 (two) times daily. 03/17/18   Eugenie Filler, MD  simvastatin (ZOCOR) 40 MG tablet Take 1 tablet (40 mg total) by mouth every morning. 08/05/16   Golden Circle, FNP  thiamine 100 MG tablet Take 1 tablet (100 mg total) by mouth daily. 04/18/16   Ghimire, Henreitta Leber, MD  traZODone (DESYREL) 50 MG tablet Take 1 tablet (50 mg total) by mouth at bedtime. 03/17/18   Eugenie Filler, MD    Family History Family History  Problem Relation Age of Onset  . Hyperlipidemia Mother   . Hypertension Mother     Social History Social History   Tobacco Use  . Smoking status: Current Some Day Smoker    Packs/day: 0.15    Years: 6.00    Pack years: 0.90    Types: Cigarettes  . Smokeless tobacco: Never Used  Substance Use Topics  . Alcohol use: Yes    Alcohol/week: 2.0 standard drinks    Types: 1 Glasses of wine, 1 Shots of liquor per week    Comment: 2-3 glasses of wine a day  . Drug use: No     Allergies   Azithromycin and Morphine and related   Review of Systems Review of Systems  All other systems reviewed and are negative.    Physical Exam Updated Vital Signs BP 128/70   Pulse (!) 106   Temp 98.2 F (36.8 C) (Axillary)   Resp 20   Ht 5\' 8"  (1.727 m)   Wt 63.5 kg   SpO2 98%   BMI 21.29 kg/m   Physical Exam Vitals signs and nursing note reviewed.    73 year old female, resting comfortably and in no acute distress. Vital signs are significant for slightly elevated heart rate. Oxygen saturation is 98%, which is normal. Head is normocephalic and atraumatic. PERRLA, EOMI. Oropharynx is clear. Neck is nontender and supple without adenopathy or JVD. Back  is nontender and there is no CVA tenderness. Lungs are clear without  rales, wheezes, or rhonchi. Chest has well localized tenderness over the upper sternal area, which reproduces her pain.  There is slight soft tissue swelling in that area without erythema or warmth. Heart has regular rate and rhythm without murmur. Abdomen is soft, flat, nontender without masses or hepatosplenomegaly and peristalsis is normoactive. Extremities have 1+ edema, full range of motion is present. Skin is warm and dry without rash. Neurologic: Mental status is normal, cranial nerves are intact, there are no motor or sensory deficits.  ED Treatments / Results  Labs (all labs ordered are listed, but only abnormal results are displayed) Labs Reviewed - No data to display  EKG EKG Interpretation  Date/Time:  Friday Mar 19 2019 06:51:42 EDT Ventricular Rate:  105 PR Interval:    QRS Duration: 80 QT Interval:  361 QTC Calculation: 478 R Axis:   65 Text Interpretation:  Sinus tachycardia Otherwise within normal limits When compared with ECG of 10/31/2018, No significant change was found Confirmed by Delora Fuel ([id number]) on 03/19/2019 7:05:20 AM   Radiology No results found.  Procedures Procedures  Medications Ordered in ED Medications - No data to display   Initial Impression / Assessment and Plan / ED Course  I have reviewed the triage vital signs and the nursing notes.  Pertinent labs & imaging results that were available during my care of the patient were reviewed by me and considered in my medical decision making (see chart for details).  Chest pain which seems to be musculoskeletal with tenderness and slight swelling over the proximal sternum.  This does not appear to be any abscess.  Work-up is been initiated.  Old records are reviewed, and she has no relevant past visits.  However, she does have several visits for upper GI bleed, so I am reluctant to use NSAIDs.  Pain does not sound cardiac at all.  ECG is significant only for mildly elevated heart rate.  Heart score  is 3, which puts her at low risk for major adverse cardiac events in the next 6 weeks.  Case is signed out to Dr. Eulis Foster.  Final Clinical Impressions(s) / ED Diagnoses   Final diagnoses:  Chest pain, unspecified type    ED Discharge Orders    None       Delora Fuel, MD 23/53/61 [phone]

## 2019-03-19 NOTE — ED Provider Notes (Signed)
9:55 AM-checkout from prior doctor to evaluate labs and consider disposition.  Patient presented for evaluation of subacute chest discomfort, and chest wall tenderness.  Clinical Course as of Mar 18 1001  Fri Mar 19, 2019  1001 Mild elevation  Sedimentation rate(!) [EW]  1002 Normal except glucose high, calcium low  Basic metabolic panel(!) [EW]  6222 Normal  Troponin I - Once [EW]  1002 Normal except hemoglobin low  CBC with Differential(!) [EW]  1002 No CHF or edema, images reviewed by me  DG Chest 2 View [EW]    Clinical Course User Index [EW] Daleen Bo, MD    EKG Interpretation  Date/Time:  Friday Mar 19 2019 06:51:42 EDT Ventricular Rate:  105 PR Interval:    QRS Duration: 80 QT Interval:  361 QTC Calculation: 478 R Axis:   65 Text Interpretation:  Sinus tachycardia Otherwise within normal limits When compared with ECG of 10/31/2018, No significant change was found Confirmed by Delora Fuel ([id number]) on 03/19/2019 7:05:20 AM Also confirmed by Delora Fuel ([id number]), editor Philomena Doheny [phone])  on 03/19/2019 7:20:51 AM        Patient Vitals for the past 24 hrs:  BP Temp Temp src Pulse Resp SpO2 Height Weight  03/19/19 0900 118/68 - - 95 18 98 % - -  03/19/19 0800 125/84 - - 95 19 100 % - -  03/19/19 0725 - - - (!) 101 17 97 % - -  03/19/19 0720 - - - (!) 101 15 100 % - -  03/19/19 0701 - - - (!) 102 15 95 % - -  03/19/19 0700 - - - (!) 101 15 96 % - -  03/19/19 0652 128/70 98.2 F (36.8 C) Axillary (!) 106 20 98 % 5\' 8"  (1.727 m) 63.5 kg  03/19/19 0650 128/70 - - (!) 107 (!) 21 99 % - -    10:03 AM Reevaluation with update and discussion. After initial assessment and treatment, an updated evaluation reveals patient is alert and fairly comfortable.  As she talks and moves with deep breathing she has moderate anterior chest wall pain associated with the sternum and left anterior chest wall.  These areas are also tender to light palpation.  There is notable tenderness  of the bilateral parasternal regions, between the second and sixth sternal chondral junctions.  There is no associated crepitation.  Findings discussed with the patient and all questions were answered. Daleen Bo   Medical Decision Making: Evaluation consistent with chest wall pain.  Doubt ACS, PE, pneumonia or impending vascular collapse.  Minor incidental abnormalities not requiring intervention, on blood work.  Symptomatic care is indicated  CRITICAL CARE-no Performed by: Daleen Bo  Nursing Notes Reviewed/ Care Coordinated Applicable Imaging Reviewed Interpretation of Laboratory Data incorporated into ED treatment  The patient appears reasonably screened and/or stabilized for discharge and I doubt any other medical condition or other [facility] requiring further screening, evaluation, or treatment in the ED at this time prior to discharge.  Plan: Home Medications-usual medicines, APAP for pain; Home Treatments-heat to affected area; return here if the recommended treatment, does not improve the symptoms; Recommended follow up-PCP, PRN     Daleen Bo, MD 03/19/19 1026

## 2019-03-30 ENCOUNTER — Other Ambulatory Visit: Payer: Self-pay

## 2019-03-30 ENCOUNTER — Emergency Department (HOSPITAL_COMMUNITY)
Admission: EM | Admit: 2019-03-30 | Discharge: 2019-03-30 | Disposition: A | Discharge status: Home / Self Care | Payer: Medicare PPO | Attending: Emergency Medicine | Admitting: Emergency Medicine

## 2019-03-30 ENCOUNTER — Encounter (HOSPITAL_COMMUNITY): Payer: Self-pay | Admitting: *Deleted

## 2019-03-30 DIAGNOSIS — F17210 Nicotine dependence, cigarettes, uncomplicated: Secondary | ICD-10-CM | POA: Insufficient documentation

## 2019-03-30 DIAGNOSIS — R197 Diarrhea, unspecified: Secondary | ICD-10-CM

## 2019-03-30 DIAGNOSIS — J45909 Unspecified asthma, uncomplicated: Secondary | ICD-10-CM | POA: Insufficient documentation

## 2019-03-30 DIAGNOSIS — R112 Nausea with vomiting, unspecified: Secondary | ICD-10-CM | POA: Diagnosis not present

## 2019-03-30 DIAGNOSIS — Z79899 Other long term (current) drug therapy: Secondary | ICD-10-CM | POA: Diagnosis not present

## 2019-03-30 LAB — COMPREHENSIVE METABOLIC PANEL
ALT: 19 U/L (ref 0–44)
AST: 41 U/L (ref 15–41)
Albumin: 3.6 g/dL (ref 3.5–5.0)
Alkaline Phosphatase: 296 U/L — ABNORMAL HIGH (ref 38–126)
Anion gap: 17 — ABNORMAL HIGH (ref 5–15)
BUN: 5 mg/dL — ABNORMAL LOW (ref 8–23)
CO2: 20 mmol/L — ABNORMAL LOW (ref 22–32)
Calcium: 9.2 mg/dL (ref 8.9–10.3)
Chloride: 103 mmol/L (ref 98–111)
Creatinine, Ser: 0.55 mg/dL (ref 0.44–1.00)
GFR calc Af Amer: >60 mL/min (ref >60)
GFR calc non Af Amer: >60 mL/min (ref >60)
Glucose, Bld: 116 mg/dL — ABNORMAL HIGH (ref 70–99)
Potassium: 3.9 mmol/L (ref 3.5–5.1)
Sodium: 140 mmol/L (ref 135–145)
Total Bilirubin: 0.4 mg/dL (ref 0.3–1.2)
Total Protein: 7.7 g/dL (ref 6.5–8.1)

## 2019-03-30 LAB — CBC WITH DIFFERENTIAL/PLATELET
Abs Immature Granulocytes: 0.02 10*3/uL (ref 0.00–0.07)
Basophils Absolute: 0.1 10*3/uL (ref 0.0–0.1)
Basophils Relative: 1 %
Eosinophils Absolute: 0 10*3/uL (ref 0.0–0.5)
Eosinophils Relative: 0 %
HCT: 40.6 % (ref 36.0–46.0)
Hemoglobin: 13.1 g/dL (ref 12.0–15.0)
Immature Granulocytes: 0 %
Lymphocytes Relative: 20 %
Lymphs Abs: 1.4 10*3/uL (ref 0.7–4.0)
MCH: 30.8 pg (ref 26.0–34.0)
MCHC: 32.3 g/dL (ref 30.0–36.0)
MCV: 95.5 fL (ref 80.0–100.0)
Monocytes Absolute: 0.4 10*3/uL (ref 0.1–1.0)
Monocytes Relative: 5 %
Neutro Abs: 5.1 10*3/uL (ref 1.7–7.7)
Neutrophils Relative %: 74 %
Platelets: 424 10*3/uL — ABNORMAL HIGH (ref 150–400)
RBC: 4.25 MIL/uL (ref 3.87–5.11)
RDW: 17.1 % — ABNORMAL HIGH (ref 11.5–15.5)
WBC: 6.9 10*3/uL (ref 4.0–10.5)
nRBC: 0 % (ref 0.0–0.2)

## 2019-03-30 LAB — LIPASE, BLOOD: Lipase: 21 U/L (ref 11–51)

## 2019-03-30 MED ORDER — FAMOTIDINE IN NACL 20-0.9 MG/50ML-% IV SOLN
20.0000 mg | INTRAVENOUS | Status: AC
  Administered 2019-03-30: 01:00:00 20 mg via INTRAVENOUS
  Filled 2019-03-30: qty 50

## 2019-03-30 MED ORDER — ONDANSETRON HCL 4 MG/2ML IJ SOLN
4.0000 mg | Freq: Once | INTRAMUSCULAR | Status: AC
  Administered 2019-03-30: 4 mg via INTRAVENOUS
  Filled 2019-03-30: qty 2

## 2019-03-30 MED ORDER — SODIUM CHLORIDE 0.9 % IV BOLUS
500.0000 mL | Freq: Once | INTRAVENOUS | Status: AC
  Administered 2019-03-30: 01:00:00 500 mL via INTRAVENOUS

## 2019-03-30 MED ORDER — ONDANSETRON 4 MG PO TBDP: 4.0000 mg | ORAL_TABLET | Freq: Three times a day (TID) | ORAL | 0 refills | Status: AC | PRN

## 2019-03-30 NOTE — ED Provider Notes (Signed)
[facility] DEPT Provider Note   CSN: [id number] Arrival date & time: 03/30/19  0014    History   Chief Complaint Chief Complaint  Patient presents with  . Diarrhea    HPI [name] is a 73 y.o. female.     The history is provided by the patient and medical records.  Diarrhea  Associated symptoms: vomiting      73 y.o. F with history of alcohol abuse, opioid abuse, chronic pain, HLP, seasonal allergies, GERD, arthritis, presenting to the ED with N/V/D.  States it has been ongoing all day.  She denies fever.  Emesis and stool have been non-bloody.  No sick contacts-- lives at home with husband who is well.  No changes in diet or recent travel.  States she was given some medication on the way here without relief.  She denies and urinary symptoms.  Of note, patient reports some continued chest wall pain.  She was seen for same about 10 days ago, told she pulled a muscle.  States she was helping her husband move a Ecologist in their house.  She denies any SOB, states it still feels "sore".  Past Medical History:  Diagnosis Date  . Alcohol abuse   . Alcohol abuse   . Allergy   . Arthritis    back-severe, hips, right knee  . Asthma   . GERD (gastroesophageal reflux disease)    occasional  . H/O measles   . H/O mumps   . Hypercholesteremia    under control  . Insomnia   . Psoriasis (a type of skin inflammation)   . Renal cell carcinoma 2012   left  . Seasonal allergies     Patient Active Problem List   Diagnosis Date Noted  . Chronic pain syndrome   . Hypomagnesemia   . Blood pressure elevated without history of HTN   . Opiate overdose ([place])   . Hypokalemia 03/14/2018  . AKI (acute kidney injury) ([place]) 03/14/2018  . Opioid overdose ([place]) 03/14/2018  . Opioid abuse ([place]) 03/14/2018  . C. difficile colitis 12/13/2016  . Alcohol withdrawal with delirium in inpatient treatment ([place])   . Acute alcohol abuse, with unspecified complication  ([place])   . Tachypnea   . UGI bleed 12/07/2016  . Acute upper GI bleed 10/25/2016  . Varices of esophagus determined by endoscopy ([place]) 10/25/2016  . Displaced oblique fracture of shaft of left femur ([place]) 04/13/2016  . Acute upper GI bleeding 04/05/2016  . Upper GI bleed 04/05/2016  . Rotator cuff tendinitis 03/11/2016  . Alcohol withdrawal ([place]) 01/04/2016  . Acute blood loss anemia 01/04/2016  . Tobacco abuse 01/03/2016  . Diarrhea 01/03/2016  . Depression 01/03/2016  . Alcohol abuse   . Chronic back pain 08/18/2015  . Seasonal allergies 07/05/2015  . Insomnia 07/05/2015  . Arthritis 07/05/2015  . Psoriasis 07/05/2015  . Menopausal hot flushes 06/01/2015  . Dyspareunia 06/01/2015  . Multiple falls 05/03/2015  . Encounter for medication review 05/03/2015  . Hot flashes 05/03/2015  . Herniated lumbar intervertebral disc 10/12/2014  . Generalized abdominal pain 09/25/2013  . Anemia of chronic disease 09/11/2013  . Asthma 04/24/2007  . Hyperlipidemia 01/04/2007    Past Surgical History:  Procedure Laterality Date  . ABDOMINAL HYSTERECTOMY  40years ago  . BUNIONECTOMY  04/2011  . CHOLECYSTECTOMY  11/13/2011   Procedure: LAPAROSCOPIC CHOLECYSTECTOMY WITH INTRAOPERATIVE CHOLANGIOGRAM;  Surgeon: Judieth Keens, DO;  Location: [facility]S;  Service: General;  Laterality: N/A;  .  COLONOSCOPY N/A 09/25/2013   Procedure: COLONOSCOPY;  Surgeon: Lear Ng, MD;  Location: [facility];  Service: Endoscopy;  Laterality: N/A;  . ESOPHAGOGASTRODUODENOSCOPY N/A 09/25/2013   Procedure: ESOPHAGOGASTRODUODENOSCOPY (EGD);  Surgeon: Lear Ng, MD;  Location: [facility];  Service: Endoscopy;  Laterality: N/A;  . ESOPHAGOGASTRODUODENOSCOPY N/A 04/07/2016   Procedure: ESOPHAGOGASTRODUODENOSCOPY (EGD);  Surgeon: Milus Banister, MD;  Location: [facility];  Service: Endoscopy;  Laterality: N/A;  . ESOPHAGOGASTRODUODENOSCOPY N/A 10/26/2016   Procedure: ESOPHAGOGASTRODUODENOSCOPY  (EGD);  Surgeon: Arta Silence, MD;  Location: [facility];  Service: Endoscopy;  Laterality: N/A;  . ESOPHAGOGASTRODUODENOSCOPY N/A 12/07/2016   Procedure: ESOPHAGOGASTRODUODENOSCOPY (EGD);  Surgeon: Wonda Horner, MD;  Location: [facility];  Service: Endoscopy;  Laterality: N/A;  . ESOPHAGOGASTRODUODENOSCOPY (EGD) WITH PROPOFOL Left 01/03/2016   Procedure: ESOPHAGOGASTRODUODENOSCOPY (EGD) WITH PROPOFOL;  Surgeon: Arta Silence, MD;  Location: [facility];  Service: Endoscopy;  Laterality: Left;  . FEMUR IM NAIL Left 04/15/2016   Procedure: INTRAMEDULLARY (IM) RETROGRADE FEMORAL NAILING;  Surgeon: Gaynelle Arabian, MD;  Location: [facility];  Service: Orthopedics;  Laterality: Left;  . HERNIA REPAIR  03/5207   supraumbilical repair  . KIDNEY SURGERY  12/2010   [facility]; partial nephrectomy  . LUMBAR LAMINECTOMY/DECOMPRESSION MICRODISCECTOMY Right 10/12/2014   Procedure: HEMI LAMINECTOMY MICRODISCECTOMY L5-S1 RIGHT (1 LEVEL);  Surgeon: Tobi Bastos, MD;  Location: [facility];  Service: Orthopedics;  Laterality: Right;  . MENISECTOMY  2010   left knee  . TUBAL LIGATION  44 years ago     OB History    Gravida  3   Para  3   Term      Preterm      AB      Living  3     SAB      TAB      Ectopic      Multiple      Live Births               Home Medications    Prior to Admission medications   Medication Sig Start Date End Date Taking? Authorizing Provider  albuterol (PROVENTIL HFA;VENTOLIN HFA) 108 (90 Base) MCG/ACT inhaler Inhale 2 puffs into the lungs every 6 (six) hours as needed for wheezing or shortness of breath. 08/05/16   Golden Circle, FNP  cholestyramine (QUESTRAN) 4 g packet Take 1 packet (4 g total) by mouth 2 (two) times daily. 12/14/16   Charlynne Cousins, MD  clobetasol cream (TEMOVATE) 0.22 % Apply 1 application topically 2 (two) times daily as needed (flare ups).    [provider]  [employer] MELATONIN 10 MG CAPS Take 10 mg by mouth at bedtime. 12/16/18    [provider]  cyclobenzaprine (FLEXERIL) 10 MG tablet Take 10 mg by mouth 3 (three) times daily as needed for muscle spasms.    [provider]  famotidine (PEPCID) 20 MG tablet Take 20 mg by mouth 2 (two) times daily.    [provider]  ferrous sulfate 325 (65 FE) MG tablet Take 1 tablet (325 mg total) by mouth 2 (two) times daily with a meal. 10/27/16   Janece Canterbury, MD  Fluticasone-Salmeterol (ADVAIR DISKUS) 500-50 MCG/DOSE AEPB INHALE 1 PUFF INTO THE LUNGS TWICE A DAY 08/01/16   Golden Circle, FNP  folic acid (FOLVITE) 1 MG tablet Take 1 tablet (1 mg total) by mouth daily. 04/18/16   Ghimire, Henreitta Leber, MD  gabapentin (NEURONTIN) 100 MG capsule Take 100 mg by mouth 2 (  two) times daily.    [provider]  Icosapent Ethyl (VASCEPA) 1 g CAPS Take 1 g by mouth 2 (two) times daily.    [provider]  Influenza vac split quadrivalent PF (FLUZONE HIGH-DOSE) 0.5 ML injection Inject 0.5 mLs into the muscle once.    [provider]  loratadine (CLARITIN) 10 MG tablet Take 10 mg by mouth daily.    [provider]  LORazepam (ATIVAN) 1 MG tablet Take 1 tablet (1 mg total) by mouth 2 (two) times daily as needed for anxiety (Anxiety, shakiness. DO NOT USE WITH ALCOHOL.). 08/01/16   Golden Circle, FNP  magnesium oxide (MAG-OX) 400 MG tablet Take 1 tablet (400 mg total) by mouth 2 (two) times daily. Patient not taking: Reported on 03/19/2019 03/17/18   Eugenie Filler, MD  montelukast (SINGULAIR) 10 MG tablet Take 10 mg by mouth daily. 01/04/19   [provider]  Multiple Vitamin (MULTIVITAMIN WITH MINERALS) TABS tablet Take 1 tablet by mouth daily. Patient not taking: Reported on 03/19/2019 04/18/16   Jonetta Osgood, MD  naloxegol oxalate (MOVANTIK) 25 MG TABS tablet Take 25 mg by mouth daily.    [provider]  ondansetron (ZOFRAN ODT) 4 MG disintegrating tablet Take 1 tablet (4 mg total) by mouth every 8 (eight)  hours as needed for nausea. Patient not taking: Reported on 03/19/2019 01/31/19   Larene Pickett, PA-C  Oxycodone HCl 20 MG TABS Take 0.5 tablets (10 mg total) by mouth every 6 (six) hours as needed. Patient taking differently: Take 20 mg by mouth every 6 (six) hours as needed (pain).  03/17/18   Eugenie Filler, MD  pantoprazole (PROTONIX) 40 MG tablet Take 1 tablet (40 mg total) by mouth daily. 03/17/18   Eugenie Filler, MD  PARoxetine (PAXIL) 30 MG tablet Take 30 mg by mouth daily. 01/18/19   [provider]  pneumococcal 13-valent conjugate vaccine (PREVNAR 13) SUSP injection Inject 0.5 mLs into the muscle once.    [provider]  polyethylene glycol (MIRALAX / GLYCOLAX) packet Take 17 g by mouth daily. 03/18/18   Eugenie Filler, MD  potassium chloride SA (K-DUR,KLOR-CON) 20 MEQ tablet Take 1 tablet (20 mEq total) by mouth daily. 08/05/16   Golden Circle, FNP  propranolol (INDERAL) 10 MG tablet Take 1 tablet (10 mg total) by mouth 2 (two) times daily. 10/27/16   Janece Canterbury, MD  senna-docusate (SENOKOT-S) 8.6-50 MG tablet Take 1 tablet by mouth 2 (two) times daily. 03/17/18   Eugenie Filler, MD  simvastatin (ZOCOR) 40 MG tablet Take 1 tablet (40 mg total) by mouth every morning. 08/05/16   Golden Circle, FNP  sucralfate (CARAFATE) 1 g tablet Take 1 tablet by mouth 4 (four) times daily.    [provider]  thiamine 100 MG tablet Take 1 tablet (100 mg total) by mouth daily. 04/18/16   Ghimire, Henreitta Leber, MD  traZODone (DESYREL) 50 MG tablet Take 1 tablet (50 mg total) by mouth at bedtime. 03/17/18   Eugenie Filler, MD    Family History Family History  Problem Relation Age of Onset  . Hyperlipidemia Mother   . Hypertension Mother     Social History Social History   Tobacco Use  . Smoking status: Current Some Day Smoker    Packs/day: 0.15    Years: 6.00    Pack years: 0.90    Types: Cigarettes  . Smokeless tobacco: Never Used  Substance Use  Topics  . Alcohol use: Yes    Alcohol/week: 2.0 standard drinks    Types: 1 Glasses of wine, 1 Shots of liquor per week    Comment: 2-3 glasses of wine a day  . Drug use: No     Allergies   Azithromycin and Morphine and related   Review of Systems Review of Systems  Gastrointestinal: Positive for diarrhea, nausea and vomiting.  All other systems reviewed and are negative.    Physical Exam Updated Vital Signs BP (!) 163/82 (BP Location: Right Arm)   Pulse 98   Temp 98.2 F (36.8 C) (Oral)   Resp (!) 24   Ht 5' 8"  (1.727 m)   Wt 63.5 kg   SpO2 100%   BMI 21.29 kg/m   Physical Exam Vitals signs and nursing note reviewed.  Constitutional:      Appearance: She is well-developed.  HENT:     Head: Normocephalic and atraumatic.  Eyes:     Conjunctiva/sclera: Conjunctivae normal.     Pupils: Pupils are equal, round, and reactive to light.  Neck:     Musculoskeletal: Normal range of motion.  Cardiovascular:     Rate and Rhythm: Normal rate and regular rhythm.     Heart sounds: Normal heart sounds.  Pulmonary:     Effort: Pulmonary effort is normal.     Breath sounds: Normal breath sounds.     Comments: Lungs clear, no distress Chest:     Comments: Left lower chest wall is TTP without noted deformity Abdominal:     General: Bowel sounds are normal.     Palpations: Abdomen is soft.     Tenderness: There is no abdominal tenderness. There is no guarding or rebound.  Musculoskeletal: Normal range of motion.  Skin:    General: Skin is warm and dry.  Neurological:     Mental Status: She is alert and oriented to person, place, and time.      ED Treatments / Results  Labs (all labs ordered are listed, but only abnormal results are displayed) Labs Reviewed  CBC WITH DIFFERENTIAL/PLATELET - Abnormal; Notable for the following components:      Result Value   RDW 17.1 (*)    Platelets 424 (*)    All other components within normal limits  COMPREHENSIVE METABOLIC  PANEL - Abnormal; Notable for the following components:   CO2 20 (*)    Glucose, Bld 116 (*)    BUN 5 (*)    Alkaline Phosphatase 296 (*)    Anion gap 17 (*)    All other components within normal limits  LIPASE, BLOOD  URINALYSIS, ROUTINE W REFLEX MICROSCOPIC    EKG None  Radiology No results found.  Procedures Procedures (including critical care time)  Medications Ordered in ED Medications  sodium chloride 0.9 % bolus 500 mL (0 mLs Intravenous Stopped 03/30/19 0322)  famotidine (PEPCID) IVPB 20 mg premix (0 mg Intravenous Stopped 03/30/19 0322)  ondansetron (ZOFRAN) injection 4 mg (4 mg Intravenous Given 03/30/19 0107)     Initial Impression / Assessment and Plan / ED Course  I have reviewed the triage vital signs and the nursing notes.  Pertinent labs & imaging results that were available during my care of the patient were reviewed by me and considered in my medical decision making (see chart for details).  73 y.o. F here with N/V/D.  States has been ongoing all day.  No changes in diet, sick contacts, or recent travel.  She is afebrile,  non-toxic.  Abdomen soft, benign.  Labs pending.  Given IVF, zofran, pepcid.  Labs reassuring--alk phos elevated which is chronic for her based on prior values. Patient reports feeling improved, wants to try to eat/drink.  Give sprite and crackers.  3:35 AM Patient states she is feeling better at this time.  She is no longer having any pain.  She has tolerated can of Sprite and a few crackers without issue.  We have reviewed her labs which are overall reassuring.  I do not feel she requires further work-up at this time.  Patient appears stable for discharge.  Rx zofran, continue gentle diet and progress as tolerated.  Can follow-up with PCP.  Return here for any new/acute changes.  Patient reports her husband does not drive, however brother will be able to pick her up at 6 AM.  She requested to wait here as they will have to bring her clothes  as well.  RN and CN notified.  Final Clinical Impressions(s) / ED Diagnoses   Final diagnoses:  Nausea vomiting and diarrhea    ED Discharge Orders         Ordered    ondansetron (ZOFRAN ODT) 4 MG disintegrating tablet  Every 8 hours PRN     03/30/19 0336           Larene Pickett, PA-C 03/30/19 0351    Fatima Blank, MD 03/30/19 [phone]

## 2019-03-30 NOTE — Discharge Instructions (Signed)
Take the prescribed medication as directed for nausea when needed. Follow-up with your primary care doctor. Return to the ED for new or worsening symptoms.

## 2019-03-30 NOTE — ED Triage Notes (Signed)
Pt arrives via EMS from home. She has had vomiting and diarrhea since yesterday. Pt was recently seen for a pulled muscle in her right chest that is not any better. Reporting right back pain. Unsure of fevers. IV was established and 4 zofran given en route. 152/85, hr 100, 97%RA, R 22.

## 2019-03-30 NOTE — ED Notes (Signed)
Bed: FQ72 Expected date:  Expected time:  Means of arrival:  Comments: EMS 89 yr N,V,D

## 2019-03-30 NOTE — ED Notes (Signed)
Pt ambulatory to restroom independently.

## 2019-03-30 NOTE — ED Notes (Signed)
Pt has tolerated sprite, no further vomiting. She has been in contact with her family

## 2019-03-30 NOTE — ED Notes (Signed)
ED Provider at bedside. 

## 2019-03-30 NOTE — ED Notes (Signed)
Pt ride coming at [facility]

## 2019-06-02 DIAGNOSIS — M545 Low back pain: Secondary | ICD-10-CM | POA: Diagnosis not present

## 2019-06-02 DIAGNOSIS — Z79891 Long term (current) use of opiate analgesic: Secondary | ICD-10-CM | POA: Diagnosis not present

## 2019-06-02 DIAGNOSIS — M79605 Pain in left leg: Secondary | ICD-10-CM | POA: Diagnosis not present

## 2019-06-02 DIAGNOSIS — G894 Chronic pain syndrome: Secondary | ICD-10-CM | POA: Diagnosis not present

## 2019-06-16 DIAGNOSIS — R69 Illness, unspecified: Secondary | ICD-10-CM | POA: Diagnosis not present

## 2019-06-16 DIAGNOSIS — G894 Chronic pain syndrome: Secondary | ICD-10-CM | POA: Diagnosis not present

## 2019-06-16 DIAGNOSIS — G4700 Insomnia, unspecified: Secondary | ICD-10-CM | POA: Diagnosis not present

## 2019-06-16 DIAGNOSIS — Z7189 Other specified counseling: Secondary | ICD-10-CM | POA: Diagnosis not present

## 2019-06-16 DIAGNOSIS — Z0000 Encounter for general adult medical examination without abnormal findings: Secondary | ICD-10-CM | POA: Diagnosis not present

## 2019-06-16 DIAGNOSIS — K5900 Constipation, unspecified: Secondary | ICD-10-CM | POA: Diagnosis not present

## 2019-06-30 DIAGNOSIS — G894 Chronic pain syndrome: Secondary | ICD-10-CM | POA: Diagnosis not present

## 2019-06-30 DIAGNOSIS — M79605 Pain in left leg: Secondary | ICD-10-CM | POA: Diagnosis not present

## 2019-06-30 DIAGNOSIS — M545 Low back pain: Secondary | ICD-10-CM | POA: Diagnosis not present

## 2019-06-30 DIAGNOSIS — Z79891 Long term (current) use of opiate analgesic: Secondary | ICD-10-CM | POA: Diagnosis not present

## 2019-07-28 DIAGNOSIS — R69 Illness, unspecified: Secondary | ICD-10-CM | POA: Diagnosis not present

## 2019-08-04 DIAGNOSIS — M79605 Pain in left leg: Secondary | ICD-10-CM | POA: Diagnosis not present

## 2019-08-04 DIAGNOSIS — M545 Low back pain: Secondary | ICD-10-CM | POA: Diagnosis not present

## 2019-08-04 DIAGNOSIS — Z79891 Long term (current) use of opiate analgesic: Secondary | ICD-10-CM | POA: Diagnosis not present

## 2019-08-04 DIAGNOSIS — G894 Chronic pain syndrome: Secondary | ICD-10-CM | POA: Diagnosis not present

## 2019-08-06 DIAGNOSIS — M150 Primary generalized (osteo)arthritis: Secondary | ICD-10-CM | POA: Diagnosis not present

## 2019-08-06 DIAGNOSIS — I10 Essential (primary) hypertension: Secondary | ICD-10-CM | POA: Diagnosis not present

## 2019-08-06 DIAGNOSIS — D508 Other iron deficiency anemias: Secondary | ICD-10-CM | POA: Diagnosis not present

## 2019-08-06 DIAGNOSIS — J4540 Moderate persistent asthma, uncomplicated: Secondary | ICD-10-CM | POA: Diagnosis not present

## 2019-08-06 DIAGNOSIS — J45909 Unspecified asthma, uncomplicated: Secondary | ICD-10-CM | POA: Diagnosis not present

## 2019-08-06 DIAGNOSIS — E785 Hyperlipidemia, unspecified: Secondary | ICD-10-CM | POA: Diagnosis not present

## 2019-08-25 DIAGNOSIS — M545 Low back pain: Secondary | ICD-10-CM | POA: Diagnosis not present

## 2019-08-25 DIAGNOSIS — G894 Chronic pain syndrome: Secondary | ICD-10-CM | POA: Diagnosis not present

## 2019-08-25 DIAGNOSIS — M79605 Pain in left leg: Secondary | ICD-10-CM | POA: Diagnosis not present

## 2019-08-25 DIAGNOSIS — Z79891 Long term (current) use of opiate analgesic: Secondary | ICD-10-CM | POA: Diagnosis not present

## 2019-09-01 DIAGNOSIS — Z885 Allergy status to narcotic agent status: Secondary | ICD-10-CM | POA: Diagnosis not present

## 2019-09-01 DIAGNOSIS — E785 Hyperlipidemia, unspecified: Secondary | ICD-10-CM | POA: Diagnosis not present

## 2019-09-01 DIAGNOSIS — K219 Gastro-esophageal reflux disease without esophagitis: Secondary | ICD-10-CM | POA: Diagnosis not present

## 2019-09-01 DIAGNOSIS — L309 Dermatitis, unspecified: Secondary | ICD-10-CM | POA: Diagnosis not present

## 2019-09-01 DIAGNOSIS — Z7951 Long term (current) use of inhaled steroids: Secondary | ICD-10-CM | POA: Diagnosis not present

## 2019-09-01 DIAGNOSIS — G4700 Insomnia, unspecified: Secondary | ICD-10-CM | POA: Diagnosis not present

## 2019-09-01 DIAGNOSIS — J45909 Unspecified asthma, uncomplicated: Secondary | ICD-10-CM | POA: Diagnosis not present

## 2019-09-01 DIAGNOSIS — Z720 Tobacco use: Secondary | ICD-10-CM | POA: Diagnosis not present

## 2019-09-01 DIAGNOSIS — Z881 Allergy status to other antibiotic agents status: Secondary | ICD-10-CM | POA: Diagnosis not present

## 2019-09-01 DIAGNOSIS — D649 Anemia, unspecified: Secondary | ICD-10-CM | POA: Diagnosis not present

## 2019-09-02 DIAGNOSIS — R69 Illness, unspecified: Secondary | ICD-10-CM | POA: Diagnosis not present

## 2019-09-02 DIAGNOSIS — D508 Other iron deficiency anemias: Secondary | ICD-10-CM | POA: Diagnosis not present

## 2019-09-02 DIAGNOSIS — E7800 Pure hypercholesterolemia, unspecified: Secondary | ICD-10-CM | POA: Diagnosis not present

## 2019-09-02 DIAGNOSIS — Z1389 Encounter for screening for other disorder: Secondary | ICD-10-CM | POA: Diagnosis not present

## 2019-09-02 DIAGNOSIS — R946 Abnormal results of thyroid function studies: Secondary | ICD-10-CM | POA: Diagnosis not present

## 2019-09-02 DIAGNOSIS — R61 Generalized hyperhidrosis: Secondary | ICD-10-CM | POA: Diagnosis not present

## 2019-09-02 DIAGNOSIS — Z720 Tobacco use: Secondary | ICD-10-CM | POA: Diagnosis not present

## 2019-09-02 DIAGNOSIS — Z0000 Encounter for general adult medical examination without abnormal findings: Secondary | ICD-10-CM | POA: Diagnosis not present

## 2019-09-02 DIAGNOSIS — K219 Gastro-esophageal reflux disease without esophagitis: Secondary | ICD-10-CM | POA: Diagnosis not present

## 2019-09-02 DIAGNOSIS — M810 Age-related osteoporosis without current pathological fracture: Secondary | ICD-10-CM | POA: Diagnosis not present

## 2019-09-06 ENCOUNTER — Other Ambulatory Visit: Payer: Self-pay | Admitting: Family Medicine

## 2019-09-06 DIAGNOSIS — E2839 Other primary ovarian failure: Secondary | ICD-10-CM

## 2019-09-08 ENCOUNTER — Other Ambulatory Visit: Payer: Self-pay | Admitting: Family Medicine

## 2019-09-08 DIAGNOSIS — Z1231 Encounter for screening mammogram for malignant neoplasm of breast: Secondary | ICD-10-CM

## 2019-09-08 DIAGNOSIS — E2839 Other primary ovarian failure: Secondary | ICD-10-CM

## 2019-09-15 ENCOUNTER — Other Ambulatory Visit: Payer: Self-pay | Admitting: Family Medicine

## 2019-09-15 DIAGNOSIS — K7030 Alcoholic cirrhosis of liver without ascites: Secondary | ICD-10-CM

## 2019-09-15 DIAGNOSIS — R61 Generalized hyperhidrosis: Secondary | ICD-10-CM

## 2019-09-21 DIAGNOSIS — K5900 Constipation, unspecified: Secondary | ICD-10-CM | POA: Diagnosis not present

## 2019-09-21 DIAGNOSIS — G894 Chronic pain syndrome: Secondary | ICD-10-CM | POA: Diagnosis not present

## 2019-09-21 DIAGNOSIS — J45909 Unspecified asthma, uncomplicated: Secondary | ICD-10-CM | POA: Diagnosis not present

## 2019-09-21 DIAGNOSIS — R69 Illness, unspecified: Secondary | ICD-10-CM | POA: Diagnosis not present

## 2019-09-22 DIAGNOSIS — G894 Chronic pain syndrome: Secondary | ICD-10-CM | POA: Diagnosis not present

## 2019-09-22 DIAGNOSIS — Z79891 Long term (current) use of opiate analgesic: Secondary | ICD-10-CM | POA: Diagnosis not present

## 2019-09-22 DIAGNOSIS — M545 Low back pain: Secondary | ICD-10-CM | POA: Diagnosis not present

## 2019-09-22 DIAGNOSIS — M79605 Pain in left leg: Secondary | ICD-10-CM | POA: Diagnosis not present

## 2019-09-23 ENCOUNTER — Other Ambulatory Visit: Payer: Medicare PPO

## 2019-10-05 ENCOUNTER — Other Ambulatory Visit: Payer: Self-pay

## 2019-10-06 ENCOUNTER — Other Ambulatory Visit: Payer: Self-pay

## 2019-10-06 ENCOUNTER — Ambulatory Visit
Admission: RE | Admit: 2019-10-06 | Discharge: 2019-10-06 | Disposition: A | Discharge status: Home / Self Care | Payer: Medicare HMO | Source: Ambulatory Visit | Attending: Family Medicine | Admitting: Family Medicine

## 2019-10-06 DIAGNOSIS — M5386 Other specified dorsopathies, lumbar region: Secondary | ICD-10-CM | POA: Diagnosis not present

## 2019-10-06 DIAGNOSIS — N281 Cyst of kidney, acquired: Secondary | ICD-10-CM | POA: Diagnosis not present

## 2019-10-06 DIAGNOSIS — M5136 Other intervertebral disc degeneration, lumbar region: Secondary | ICD-10-CM | POA: Diagnosis not present

## 2019-10-06 DIAGNOSIS — Z90710 Acquired absence of both cervix and uterus: Secondary | ICD-10-CM | POA: Diagnosis not present

## 2019-10-06 DIAGNOSIS — K7030 Alcoholic cirrhosis of liver without ascites: Secondary | ICD-10-CM

## 2019-10-06 DIAGNOSIS — I700 Atherosclerosis of aorta: Secondary | ICD-10-CM | POA: Diagnosis not present

## 2019-10-06 DIAGNOSIS — R61 Generalized hyperhidrosis: Secondary | ICD-10-CM

## 2019-10-06 DIAGNOSIS — K7460 Unspecified cirrhosis of liver: Secondary | ICD-10-CM | POA: Diagnosis not present

## 2019-10-06 DIAGNOSIS — Z9049 Acquired absence of other specified parts of digestive tract: Secondary | ICD-10-CM | POA: Diagnosis not present

## 2019-10-06 MED ORDER — IOPAMIDOL (ISOVUE-300) INJECTION 61%
100.0000 mL | Freq: Once | INTRAVENOUS | Status: AC | PRN
  Administered 2019-10-06: 100 mL via INTRAVENOUS

## 2019-10-21 DIAGNOSIS — M79605 Pain in left leg: Secondary | ICD-10-CM | POA: Diagnosis not present

## 2019-10-21 DIAGNOSIS — Z79891 Long term (current) use of opiate analgesic: Secondary | ICD-10-CM | POA: Diagnosis not present

## 2019-10-21 DIAGNOSIS — G894 Chronic pain syndrome: Secondary | ICD-10-CM | POA: Diagnosis not present

## 2019-10-21 DIAGNOSIS — M545 Low back pain: Secondary | ICD-10-CM | POA: Diagnosis not present

## 2019-12-02 ENCOUNTER — Ambulatory Visit: Payer: Medicare PPO

## 2019-12-02 ENCOUNTER — Inpatient Hospital Stay: Admission: RE | Admit: 2019-12-02 | Payer: Medicare PPO | Source: Ambulatory Visit

## 2020-01-13 ENCOUNTER — Ambulatory Visit: Payer: Self-pay | Attending: Internal Medicine

## 2020-01-13 DIAGNOSIS — Z23 Encounter for immunization: Secondary | ICD-10-CM | POA: Insufficient documentation

## 2020-01-13 NOTE — Progress Notes (Signed)
   Covid-19 Vaccination Clinic  Name:  [name]    MRN: [id number] DOB: 01/19/1946  01/13/2020  [name] was observed post Covid-19 immunization for 15 minutes without incident. She was provided with Vaccine Information Sheet and instruction to access the V-Safe system.   [name] was instructed to call 911 with any severe reactions post vaccine: Marland Kitchen Difficulty breathing  . Swelling of face and throat  . A fast heartbeat  . A bad rash all over body  . Dizziness and weakness   Immunizations Administered    Name Date Dose VIS Date Route   Pfizer COVID-19 Vaccine 01/13/2020  5:40 PM 0.3 mL 10/22/2019 Intramuscular   Manufacturer: [place]   Lot: [id number]   [place]: [id number]

## 2020-02-09 ENCOUNTER — Ambulatory Visit: Payer: Self-pay | Attending: Internal Medicine

## 2020-02-09 DIAGNOSIS — Z23 Encounter for immunization: Secondary | ICD-10-CM

## 2020-02-09 NOTE — Progress Notes (Signed)
   Covid-19 Vaccination Clinic  Name:  [name]    MRN: [id number] DOB: [birth year]  02/09/2020  [name] was observed post Covid-19 immunization for 15 minutes without incident. She was provided with Vaccine Information Sheet and instruction to access the V-Safe system.   [name] was instructed to call 911 with any severe reactions post vaccine: Marland Kitchen Difficulty breathing  . Swelling of face and throat  . A fast heartbeat  . A bad rash all over body  . Dizziness and weakness   Immunizations Administered    Name Date Dose VIS Date Route   Pfizer COVID-19 Vaccine 02/09/2020  2:36 PM 0.3 mL 10/22/2019 Intramuscular   Manufacturer: [employer], [employer]   Lot: [id number]   [place]: [id number]

## 2020-05-30 ENCOUNTER — Other Ambulatory Visit: Payer: Self-pay

## 2020-05-30 ENCOUNTER — Ambulatory Visit: Payer: Self-pay

## 2020-06-26 DIAGNOSIS — M545 Low back pain: Secondary | ICD-10-CM | POA: Diagnosis not present

## 2020-06-26 DIAGNOSIS — E559 Vitamin D deficiency, unspecified: Secondary | ICD-10-CM | POA: Diagnosis not present

## 2020-06-26 DIAGNOSIS — Z79899 Other long term (current) drug therapy: Secondary | ICD-10-CM | POA: Diagnosis not present

## 2020-07-26 DIAGNOSIS — M545 Low back pain: Secondary | ICD-10-CM | POA: Diagnosis not present

## 2020-07-26 DIAGNOSIS — R232 Flushing: Secondary | ICD-10-CM | POA: Diagnosis not present

## 2020-07-26 DIAGNOSIS — Z79899 Other long term (current) drug therapy: Secondary | ICD-10-CM | POA: Diagnosis not present

## 2020-08-14 DIAGNOSIS — K006 Disturbances in tooth eruption: Secondary | ICD-10-CM | POA: Diagnosis not present

## 2020-08-14 DIAGNOSIS — R69 Illness, unspecified: Secondary | ICD-10-CM | POA: Diagnosis not present

## 2020-08-28 DIAGNOSIS — R232 Flushing: Secondary | ICD-10-CM | POA: Diagnosis not present

## 2020-08-28 DIAGNOSIS — M545 Low back pain, unspecified: Secondary | ICD-10-CM | POA: Diagnosis not present

## 2020-08-28 DIAGNOSIS — Z9109 Other allergy status, other than to drugs and biological substances: Secondary | ICD-10-CM | POA: Diagnosis not present

## 2020-08-28 DIAGNOSIS — Z79899 Other long term (current) drug therapy: Secondary | ICD-10-CM | POA: Diagnosis not present

## 2020-09-06 DIAGNOSIS — J302 Other seasonal allergic rhinitis: Secondary | ICD-10-CM | POA: Diagnosis not present

## 2020-09-06 DIAGNOSIS — J3089 Other allergic rhinitis: Secondary | ICD-10-CM | POA: Diagnosis not present

## 2020-09-11 ENCOUNTER — Ambulatory Visit: Payer: Medicare HMO | Attending: Critical Care Medicine

## 2020-09-11 ENCOUNTER — Ambulatory Visit: Payer: Self-pay | Admitting: *Deleted

## 2020-09-11 DIAGNOSIS — Z23 Encounter for immunization: Secondary | ICD-10-CM

## 2020-09-11 NOTE — Progress Notes (Signed)
   Covid-19 Vaccination Clinic  Name:  [name]    MRN: [id number] DOB: 1946-07-03  09/11/2020  [name] was observed post Covid-19 immunization for 15 MINUTESwithout incident. She was provided with Vaccine Information Sheet and instruction to access the V-Safe system.   [name] was instructed to call 911 with any severe reactions post vaccine: Marland Kitchen Difficulty breathing  . Swelling of face and throat  . A fast heartbeat  . A bad rash all over body  . Dizziness and weakness

## 2020-09-11 NOTE — Progress Notes (Signed)
Patient tolerated vaccine well today with no reports of any concerns with previous injections.

## 2020-09-12 ENCOUNTER — Telehealth: Payer: Self-pay

## 2020-09-12 NOTE — Telephone Encounter (Signed)
Contacted patient by phone to retrieve personal items from the unit.

## 2020-09-29 DIAGNOSIS — M545 Low back pain, unspecified: Secondary | ICD-10-CM | POA: Diagnosis not present

## 2020-09-29 DIAGNOSIS — M1732 Unilateral post-traumatic osteoarthritis, left knee: Secondary | ICD-10-CM | POA: Diagnosis not present

## 2020-09-29 DIAGNOSIS — F17210 Nicotine dependence, cigarettes, uncomplicated: Secondary | ICD-10-CM | POA: Diagnosis not present

## 2020-09-29 DIAGNOSIS — Z79899 Other long term (current) drug therapy: Secondary | ICD-10-CM | POA: Diagnosis not present

## 2020-10-16 DIAGNOSIS — Z79899 Other long term (current) drug therapy: Secondary | ICD-10-CM | POA: Diagnosis not present

## 2020-10-31 DIAGNOSIS — Z79899 Other long term (current) drug therapy: Secondary | ICD-10-CM | POA: Diagnosis not present

## 2020-10-31 DIAGNOSIS — L239 Allergic contact dermatitis, unspecified cause: Secondary | ICD-10-CM | POA: Diagnosis not present

## 2020-10-31 DIAGNOSIS — M545 Low back pain, unspecified: Secondary | ICD-10-CM | POA: Diagnosis not present

## 2020-10-31 DIAGNOSIS — M25562 Pain in left knee: Secondary | ICD-10-CM | POA: Diagnosis not present

## 2020-12-19 ENCOUNTER — Other Ambulatory Visit: Payer: Self-pay

## 2020-12-19 ENCOUNTER — Encounter (HOSPITAL_COMMUNITY): Payer: Self-pay

## 2020-12-19 ENCOUNTER — Emergency Department (HOSPITAL_BASED_OUTPATIENT_CLINIC_OR_DEPARTMENT_OTHER): Payer: Medicare Other

## 2020-12-19 ENCOUNTER — Emergency Department (HOSPITAL_COMMUNITY)
Admission: EM | Admit: 2020-12-19 | Discharge: 2020-12-19 | Disposition: A | Discharge status: Home / Self Care | Payer: Medicare Other | Attending: Emergency Medicine | Admitting: Emergency Medicine

## 2020-12-19 ENCOUNTER — Emergency Department (HOSPITAL_COMMUNITY): Payer: Medicare Other

## 2020-12-19 DIAGNOSIS — R1032 Left lower quadrant pain: Secondary | ICD-10-CM | POA: Diagnosis not present

## 2020-12-19 DIAGNOSIS — M79605 Pain in left leg: Secondary | ICD-10-CM | POA: Diagnosis present

## 2020-12-19 DIAGNOSIS — R600 Localized edema: Secondary | ICD-10-CM | POA: Insufficient documentation

## 2020-12-19 DIAGNOSIS — F17210 Nicotine dependence, cigarettes, uncomplicated: Secondary | ICD-10-CM | POA: Insufficient documentation

## 2020-12-19 DIAGNOSIS — Z85528 Personal history of other malignant neoplasm of kidney: Secondary | ICD-10-CM | POA: Insufficient documentation

## 2020-12-19 DIAGNOSIS — J45909 Unspecified asthma, uncomplicated: Secondary | ICD-10-CM | POA: Insufficient documentation

## 2020-12-19 DIAGNOSIS — M25562 Pain in left knee: Secondary | ICD-10-CM | POA: Diagnosis not present

## 2020-12-19 DIAGNOSIS — M7989 Other specified soft tissue disorders: Secondary | ICD-10-CM | POA: Diagnosis not present

## 2020-12-19 NOTE — ED Triage Notes (Signed)
Patient c/o left knee swelling and left groin pain x 3 days. Patient denies any falls or other injuries.

## 2020-12-19 NOTE — CV Procedure (Signed)
LLE venous duplex completed. Dr. Ronnald Nian given preliminary results.  Results can be found under chart review under CV PROC. 12/19/2020 7:13 PM Sydney Hasten RVT, RDMS

## 2020-12-19 NOTE — Discharge Instructions (Addendum)
Your DVT study was negative.  You do not have a blood clot in your leg.  X-rays were consistent with arthritis.  No concern for infection or fracture in your leg.  Follow-up with your primary care doctor as discussed.

## 2020-12-19 NOTE — ED Provider Notes (Signed)
[facility] DEPT Provider Note   CSN: [id number] Arrival date & time: 12/19/20  1605     History Chief Complaint  Patient presents with  . Joint Swelling  . Groin Pain    [name] is a 75 y.o. female.  The history is provided by the patient.  Leg Pain Location:  Leg Leg location:  L leg Pain details:    Quality:  Aching   Radiates to:  Does not radiate   Severity:  Mild   Onset quality:  Gradual   Timing:  Intermittent   Progression:  Waxing and waning Chronicity:  New Relieved by:  Nothing Worsened by:  Nothing Associated symptoms: stiffness and swelling   Associated symptoms: no back pain, no decreased ROM, no fatigue, no fever, no itching and no muscle weakness   Associated symptoms comment:  Pain in back of left knee up into left groin area, hx of femur fx in left leg       Past Medical History:  Diagnosis Date  . Alcohol abuse   . Alcohol abuse   . Allergy   . Arthritis    back-severe, hips, right knee  . Asthma   . GERD (gastroesophageal reflux disease)    occasional  . H/O measles   . H/O mumps   . Hypercholesteremia    under control  . Insomnia   . Psoriasis (a type of skin inflammation)   . Renal cell carcinoma 2012   left  . Seasonal allergies     Patient Active Problem List   Diagnosis Date Noted  . Chronic pain syndrome   . Hypomagnesemia   . Blood pressure elevated without history of HTN   . Opiate overdose ([place])   . Hypokalemia 03/14/2018  . AKI (acute kidney injury) ([place]) 03/14/2018  . Opioid overdose ([place]) 03/14/2018  . Opioid abuse ([place]) 03/14/2018  . C. difficile colitis 12/13/2016  . Alcohol withdrawal with delirium in inpatient treatment ([place])   . Acute alcohol abuse, with unspecified complication ([place])   . Tachypnea   . UGI bleed 12/07/2016  . Acute upper GI bleed 10/25/2016  . Varices of esophagus determined by endoscopy ([place]) 10/25/2016  . Displaced oblique fracture of shaft of left  femur ([place]) 04/13/2016  . Acute upper GI bleeding 04/05/2016  . Upper GI bleed 04/05/2016  . Rotator cuff tendinitis 03/11/2016  . Alcohol withdrawal ([place]) 01/04/2016  . Acute blood loss anemia 01/04/2016  . Tobacco abuse 01/03/2016  . Diarrhea 01/03/2016  . Depression 01/03/2016  . Alcohol abuse   . Chronic back pain 08/18/2015  . Seasonal allergies 07/05/2015  . Insomnia 07/05/2015  . Arthritis 07/05/2015  . Psoriasis 07/05/2015  . Menopausal hot flushes 06/01/2015  . Dyspareunia 06/01/2015  . Multiple falls 05/03/2015  . Encounter for medication review 05/03/2015  . Hot flashes 05/03/2015  . Herniated lumbar intervertebral disc 10/12/2014  . Generalized abdominal pain 09/25/2013  . Anemia of chronic disease 09/11/2013  . Asthma 04/24/2007  . Hyperlipidemia 01/04/2007    Past Surgical History:  Procedure Laterality Date  . ABDOMINAL HYSTERECTOMY  40years ago  . BUNIONECTOMY  04/2011  . CHOLECYSTECTOMY  11/13/2011   Procedure: LAPAROSCOPIC CHOLECYSTECTOMY WITH INTRAOPERATIVE CHOLANGIOGRAM;  Surgeon: Judieth Keens, DO;  Location: [facility];  Service: General;  Laterality: N/A;  . COLONOSCOPY N/A 09/25/2013   Procedure: COLONOSCOPY;  Surgeon: Lear Ng, MD;  Location: [facility];  Service: Endoscopy;  Laterality: N/A;  . ESOPHAGOGASTRODUODENOSCOPY N/A 09/25/2013  Procedure: ESOPHAGOGASTRODUODENOSCOPY (EGD);  Surgeon: Lear Ng, MD;  Location: [facility];  Service: Endoscopy;  Laterality: N/A;  . ESOPHAGOGASTRODUODENOSCOPY N/A 04/07/2016   Procedure: ESOPHAGOGASTRODUODENOSCOPY (EGD);  Surgeon: Milus Banister, MD;  Location: [facility];  Service: Endoscopy;  Laterality: N/A;  . ESOPHAGOGASTRODUODENOSCOPY N/A 10/26/2016   Procedure: ESOPHAGOGASTRODUODENOSCOPY (EGD);  Surgeon: Arta Silence, MD;  Location: [facility];  Service: Endoscopy;  Laterality: N/A;  . ESOPHAGOGASTRODUODENOSCOPY N/A 12/07/2016   Procedure: ESOPHAGOGASTRODUODENOSCOPY (EGD);   Surgeon: Wonda Horner, MD;  Location: [facility];  Service: Endoscopy;  Laterality: N/A;  . ESOPHAGOGASTRODUODENOSCOPY (EGD) WITH PROPOFOL Left 01/03/2016   Procedure: ESOPHAGOGASTRODUODENOSCOPY (EGD) WITH PROPOFOL;  Surgeon: Arta Silence, MD;  Location: [facility];  Service: Endoscopy;  Laterality: Left;  . FEMUR IM NAIL Left 04/15/2016   Procedure: INTRAMEDULLARY (IM) RETROGRADE FEMORAL NAILING;  Surgeon: Gaynelle Arabian, MD;  Location: [facility];  Service: Orthopedics;  Laterality: Left;  . HERNIA REPAIR  12/4399   supraumbilical repair  . KIDNEY SURGERY  12/2010   [facility]; partial nephrectomy  . LUMBAR LAMINECTOMY/DECOMPRESSION MICRODISCECTOMY Right 10/12/2014   Procedure: HEMI LAMINECTOMY MICRODISCECTOMY L5-S1 RIGHT (1 LEVEL);  Surgeon: Tobi Bastos, MD;  Location: [facility];  Service: Orthopedics;  Laterality: Right;  . MENISECTOMY  2010   left knee  . TUBAL LIGATION  44 years ago     OB History    Gravida  3   Para  3   Term      Preterm      AB      Living  3     SAB      IAB      Ectopic      Multiple      Live Births              Family History  Problem Relation Age of Onset  . Hyperlipidemia Mother   . Hypertension Mother     Social History   Tobacco Use  . Smoking status: Current Some Day Smoker    Packs/day: 0.15    Years: 6.00    Pack years: 0.90    Types: Cigarettes  . Smokeless tobacco: Never Used  Vaping Use  . Vaping Use: Never used  Substance Use Topics  . Alcohol use: Yes    Alcohol/week: 2.0 standard drinks    Types: 1 Glasses of wine, 1 Shots of liquor per week    Comment: 2-3 glasses of wine a day  . Drug use: No    Home Medications Prior to Admission medications   Medication Sig Start Date End Date Taking? Authorizing Provider  albuterol (PROVENTIL HFA;VENTOLIN HFA) 108 (90 Base) MCG/ACT inhaler Inhale 2 puffs into the lungs every 6 (six) hours as needed for wheezing or shortness of breath. 08/05/16   Golden Circle, FNP   cholestyramine (QUESTRAN) 4 g packet Take 1 packet (4 g total) by mouth 2 (two) times daily. 12/14/16   Charlynne Cousins, MD  clobetasol cream (TEMOVATE) 0.27 % Apply 1 application topically 2 (two) times daily as needed (flare ups).    [provider]  [employer] MELATONIN 10 MG CAPS Take 10 mg by mouth at bedtime. 12/16/18   [provider]  cyclobenzaprine (FLEXERIL) 10 MG tablet Take 10 mg by mouth 3 (three) times daily as needed for muscle spasms.    [provider]  famotidine (PEPCID) 20 MG tablet Take 20 mg by mouth 2 (two) times daily.    [provider]  ferrous  sulfate 325 (65 FE) MG tablet Take 1 tablet (325 mg total) by mouth 2 (two) times daily with a meal. 10/27/16   Short, Noah Delaine, MD  Fluticasone-Salmeterol (ADVAIR DISKUS) 500-50 MCG/DOSE AEPB INHALE 1 PUFF INTO THE LUNGS TWICE A DAY Patient taking differently: Inhale 1 puff into the lungs 2 (two) times a day.  08/01/16   Golden Circle, FNP  folic acid (FOLVITE) 1 MG tablet Take 1 tablet (1 mg total) by mouth daily. 04/18/16   Ghimire, Henreitta Leber, MD  gabapentin (NEURONTIN) 100 MG capsule Take 100 mg by mouth 2 (two) times daily.    [provider]  Icosapent Ethyl (VASCEPA) 1 g CAPS Take 1 g by mouth 2 (two) times daily.    [provider]  Influenza vac split quadrivalent PF (FLUZONE HIGH-DOSE) 0.5 ML injection Inject 0.5 mLs into the muscle once.    [provider]  loratadine (CLARITIN) 10 MG tablet Take 10 mg by mouth daily.    [provider]  LORazepam (ATIVAN) 1 MG tablet Take 1 tablet (1 mg total) by mouth 2 (two) times daily as needed for anxiety (Anxiety, shakiness. DO NOT USE WITH ALCOHOL.). 08/01/16   Golden Circle, FNP  magnesium oxide (MAG-OX) 400 MG tablet Take 1 tablet (400 mg total) by mouth 2 (two) times daily. Patient not taking: Reported on 03/19/2019 03/17/18   Eugenie Filler, MD  montelukast (SINGULAIR) 10 MG tablet Take 10 mg by mouth  daily. 01/04/19   [provider]  Multiple Vitamin (MULTIVITAMIN WITH MINERALS) TABS tablet Take 1 tablet by mouth daily. Patient not taking: Reported on 03/19/2019 04/18/16   Jonetta Osgood, MD  naloxegol oxalate (MOVANTIK) 25 MG TABS tablet Take 25 mg by mouth daily.    [provider]  ondansetron (ZOFRAN ODT) 4 MG disintegrating tablet Take 1 tablet (4 mg total) by mouth every 8 (eight) hours as needed for nausea. 03/30/19   Larene Pickett, PA-C  Oxycodone HCl 20 MG TABS Take 0.5 tablets (10 mg total) by mouth every 6 (six) hours as needed. Patient taking differently: Take 20 mg by mouth every 6 (six) hours as needed (pain).  03/17/18   Eugenie Filler, MD  pantoprazole (PROTONIX) 40 MG tablet Take 1 tablet (40 mg total) by mouth daily. 03/17/18   Eugenie Filler, MD  PARoxetine (PAXIL) 30 MG tablet Take 30 mg by mouth daily. 01/18/19   [provider]  pneumococcal 13-valent conjugate vaccine (PREVNAR 13) SUSP injection Inject 0.5 mLs into the muscle once.    [provider]  polyethylene glycol (MIRALAX / GLYCOLAX) packet Take 17 g by mouth daily. 03/18/18   Eugenie Filler, MD  potassium chloride SA (K-DUR,KLOR-CON) 20 MEQ tablet Take 1 tablet (20 mEq total) by mouth daily. 08/05/16   Golden Circle, FNP  propranolol (INDERAL) 10 MG tablet Take 1 tablet (10 mg total) by mouth 2 (two) times daily. 10/27/16   Janece Canterbury, MD  senna-docusate (SENOKOT-S) 8.6-50 MG tablet Take 1 tablet by mouth 2 (two) times daily. 03/17/18   Eugenie Filler, MD  simvastatin (ZOCOR) 40 MG tablet Take 1 tablet (40 mg total) by mouth every morning. 08/05/16   Golden Circle, FNP  sucralfate (CARAFATE) 1 g tablet Take 1 tablet by mouth 4 (four) times daily.    [provider]  thiamine 100 MG tablet Take 1 tablet (100 mg total) by mouth daily. 04/18/16   Ghimire, Henreitta Leber, MD  traZODone (  DESYREL) 50 MG tablet Take 1 tablet (50 mg total) by mouth at bedtime.  03/17/18   Eugenie Filler, MD    Allergies    Azithromycin and Morphine and related  Review of Systems   Review of Systems  Constitutional: Negative for fatigue and fever.  Cardiovascular: Negative for leg swelling.  Musculoskeletal: Positive for arthralgias, gait problem, joint swelling and stiffness. Negative for back pain.  Skin: Negative for color change, itching, pallor, rash and wound.  Neurological: Negative for weakness and numbness.    Physical Exam Updated Vital Signs BP (!) 142/98 (BP Location: Left Arm)   Pulse (!) 101   Temp 97.9 F (36.6 C) (Oral)   Resp 16   Ht 5\' 8"  (1.727 m)   Wt 57.7 kg   SpO2 100%   BMI 19.34 kg/m   Physical Exam Constitutional:      General: She is not in acute distress.    Appearance: She is not ill-appearing.  Cardiovascular:     Pulses: Normal pulses.  Musculoskeletal:        General: Tenderness present. Normal range of motion.     Right lower leg: No edema.     Left lower leg: No edema.     Comments: Tenderness to the left knee and left popliteal area and into the left hamstring, no obvious major swelling of the left knee, good range of motion at the left knee without any pain  Skin:    General: Skin is warm.     Findings: No erythema.  Neurological:     General: No focal deficit present.     Mental Status: She is alert.     Sensory: No sensory deficit.     Motor: No weakness.     Comments: 5+ out of 5 strength to bilateral lower extremity, normal sensation     ED Results / Procedures / Treatments   Labs (all labs ordered are listed, but only abnormal results are displayed) Labs Reviewed - No data to display  EKG None  Radiology DG Knee Complete 4 Views Left  Result Date: 12/19/2020 CLINICAL DATA:  Left knee pain and swelling for 3 days, previous fracture and ORIF EXAM: LEFT KNEE - COMPLETE 4+ VIEW COMPARISON:  04/15/2016 FINDINGS: Frontal, bilateral oblique, lateral views of the left knee are obtained. Distal  margin of an intramedullary rod is seen within the femur, with multiple distal interlocking screws. Prior healed distal left femoral diaphyseal fractures identified with near anatomic alignment. No acute fracture, subluxation, or dislocation. There is mild to moderate 3 compartmental osteoarthritis. No joint effusion. Soft tissues are unremarkable. IMPRESSION: 1. Three compartmental osteoarthritis.  No acute fracture. 2. Previous distal left femoral ORIF. Electronically Signed   By: Randa Ngo M.D.   On: 12/19/2020 16:59    Procedures Procedures   Medications Ordered in ED Medications - No data to display  ED Course  I have reviewed the triage vital signs and the nursing notes.  Pertinent labs & imaging results that were available during my care of the patient were reviewed by me and considered in my medical decision making (see chart for details).    MDM Rules/Calculators/A&P                          France Ravens is here with left leg pain.  Patient with unremarkable vitals.  No fever.  History of left distal femur surgery.  Having pain in the left knee  joint extending up into the left hamstring groin area.  No swelling to the leg on exam.  Good pulses.  Doubt peripheral arterial disease.  Left knee without any signs of major swelling.  Good range of motion.  No concern for overlying infection or septic joint.  X-ray showed severe arthritis in the left knee but no fracture.  Suspect arthritic type pain with referred pain.  We will get a DVT study to rule out DVT.  DVT study negative.  Overall suspect arthritis pain.  Recommend follow-up with primary care doctor who has performed the injections in the past.  Discharged in good condition.  This chart was dictated using voice recognition software.  Despite best efforts to proofread,  errors can occur which can change the documentation meaning.    Final Clinical Impression(s) / ED Diagnoses Final diagnoses:  Acute pain of left knee    Rx  / DC Orders ED Discharge Orders    None       Lennice Sites, DO 12/19/20 1839

## 2021-05-16 ENCOUNTER — Emergency Department (HOSPITAL_COMMUNITY)
Admission: EM | Admit: 2021-05-16 | Discharge: 2021-05-17 | Disposition: A | Discharge status: Home / Self Care | Payer: Medicare (Managed Care) | Attending: Emergency Medicine | Admitting: Emergency Medicine

## 2021-05-16 ENCOUNTER — Encounter (HOSPITAL_COMMUNITY): Payer: Self-pay | Admitting: Emergency Medicine

## 2021-05-16 ENCOUNTER — Other Ambulatory Visit: Payer: Self-pay

## 2021-05-16 ENCOUNTER — Emergency Department (HOSPITAL_COMMUNITY): Payer: Medicare (Managed Care)

## 2021-05-16 DIAGNOSIS — Z8553 Personal history of malignant neoplasm of renal pelvis: Secondary | ICD-10-CM | POA: Insufficient documentation

## 2021-05-16 DIAGNOSIS — Z7951 Long term (current) use of inhaled steroids: Secondary | ICD-10-CM | POA: Insufficient documentation

## 2021-05-16 DIAGNOSIS — X501XXA Overexertion from prolonged static or awkward postures, initial encounter: Secondary | ICD-10-CM | POA: Diagnosis not present

## 2021-05-16 DIAGNOSIS — J45909 Unspecified asthma, uncomplicated: Secondary | ICD-10-CM | POA: Insufficient documentation

## 2021-05-16 DIAGNOSIS — W19XXXA Unspecified fall, initial encounter: Secondary | ICD-10-CM | POA: Diagnosis not present

## 2021-05-16 DIAGNOSIS — F17210 Nicotine dependence, cigarettes, uncomplicated: Secondary | ICD-10-CM | POA: Insufficient documentation

## 2021-05-16 DIAGNOSIS — M25572 Pain in left ankle and joints of left foot: Secondary | ICD-10-CM | POA: Diagnosis not present

## 2021-05-16 NOTE — ED Triage Notes (Signed)
Pt reports L ankle and foot pain after a mechanical fall yesterday. Denies LOC. States that she took some oxycodone this morning for the pain which helped for a little while.

## 2021-05-16 NOTE — ED Provider Notes (Signed)
[facility] DEPT Provider Note   CSN: [id number] Arrival date & time: 05/16/21  2249     History Chief Complaint  Patient presents with   [name] is a 75 y.o. female.  The history is provided by the patient and medical records.  Fall  75 year old female with history of alcohol abuse, arthritis, GERD, hyperlipidemia, presenting to the ED after a fall that occurred yesterday evening.  Patient states she was taking out her dog before bed last evening, dog got excited on his leash which tripped her up and she fell and twisted her left ankle/foot.  She denies any head injury or loss of consciousness.  Reports increased swelling and pain to the left ankle and foot today.  She remains ambulatory but is limping.  She did take her home oxycodone this afternoon which did seem to help.  Past Medical History:  Diagnosis Date   Alcohol abuse    Alcohol abuse    Allergy    Arthritis    back-severe, hips, right knee   Asthma    GERD (gastroesophageal reflux disease)    occasional   H/O measles    H/O mumps    Hypercholesteremia    under control   Insomnia    Psoriasis (a type of skin inflammation)    Renal cell carcinoma 2012   left   Seasonal allergies     Patient Active Problem List   Diagnosis Date Noted   Chronic pain syndrome    Hypomagnesemia    Blood pressure elevated without history of HTN    Opiate overdose ([place])    Hypokalemia 03/14/2018   AKI (acute kidney injury) ([place]) 03/14/2018   Opioid overdose ([place]) 03/14/2018   Opioid abuse ([place]) 03/14/2018   C. 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 ([place]) 10/25/2016   Displaced oblique fracture of shaft of left femur ([place]) 04/13/2016   Acute upper GI  bleeding 04/05/2016   Upper GI bleed 04/05/2016   Rotator cuff tendinitis 03/11/2016   Alcohol withdrawal ([place]) 01/04/2016   Acute blood loss anemia 01/04/2016   Tobacco abuse 01/03/2016   Diarrhea 01/03/2016   Depression 01/03/2016   Alcohol abuse    Chronic back pain 08/18/2015   Seasonal allergies 07/05/2015   Insomnia 07/05/2015   Arthritis 07/05/2015   Psoriasis 07/05/2015   Menopausal hot flushes 06/01/2015   Dyspareunia 06/01/2015   Multiple falls 05/03/2015   Encounter for medication review 05/03/2015   Hot flashes 05/03/2015   Herniated lumbar intervertebral disc 10/12/2014   Generalized abdominal pain 09/25/2013   Anemia of chronic disease 09/11/2013   Asthma 04/24/2007   Hyperlipidemia 01/04/2007    Past Surgical History:  Procedure Laterality Date   ABDOMINAL HYSTERECTOMY  40years ago   BUNIONECTOMY  04/2011   CHOLECYSTECTOMY  11/13/2011   Procedure: LAPAROSCOPIC CHOLECYSTECTOMY WITH INTRAOPERATIVE CHOLANGIOGRAM;  Surgeon: Judieth Keens, DO;  Location: [facility];  Service: General;  Laterality: N/A;   COLONOSCOPY N/A 09/25/2013   Procedure: COLONOSCOPY;  Surgeon: Lear Ng, MD;  Location: [facility];  Service: Endoscopy;  Laterality: N/A;   ESOPHAGOGASTRODUODENOSCOPY N/A 09/25/2013   Procedure: ESOPHAGOGASTRODUODENOSCOPY (EGD);  Surgeon: Lear Ng, MD;  Location: [facility];  Service: Endoscopy;  Laterality:  N/A;   ESOPHAGOGASTRODUODENOSCOPY N/A 04/07/2016   Procedure: ESOPHAGOGASTRODUODENOSCOPY (EGD);  Surgeon: Milus Banister, MD;  Location: [facility];  Service: Endoscopy;  Laterality: N/A;   ESOPHAGOGASTRODUODENOSCOPY N/A 10/26/2016   Procedure: ESOPHAGOGASTRODUODENOSCOPY (EGD);  Surgeon: Arta Silence, MD;  Location: [facility];  Service: Endoscopy;  Laterality: N/A;   ESOPHAGOGASTRODUODENOSCOPY N/A 12/07/2016   Procedure: ESOPHAGOGASTRODUODENOSCOPY (EGD);  Surgeon: Wonda Horner, MD;  Location: [facility];  Service: Endoscopy;   Laterality: N/A;   ESOPHAGOGASTRODUODENOSCOPY (EGD) WITH PROPOFOL Left 01/03/2016   Procedure: ESOPHAGOGASTRODUODENOSCOPY (EGD) WITH PROPOFOL;  Surgeon: Arta Silence, MD;  Location: [facility];  Service: Endoscopy;  Laterality: Left;   FEMUR IM NAIL Left 04/15/2016   Procedure: INTRAMEDULLARY (IM) RETROGRADE FEMORAL NAILING;  Surgeon: Gaynelle Arabian, MD;  Location: [facility];  Service: Orthopedics;  Laterality: Left;   HERNIA REPAIR  02/2705   supraumbilical repair   KIDNEY SURGERY  12/2010   [facility]; partial nephrectomy   LUMBAR LAMINECTOMY/DECOMPRESSION MICRODISCECTOMY Right 10/12/2014   Procedure: HEMI LAMINECTOMY MICRODISCECTOMY L5-S1 RIGHT (1 LEVEL);  Surgeon: Tobi Bastos, MD;  Location: [facility];  Service: Orthopedics;  Laterality: Right;   MENISECTOMY  2010   left knee   TUBAL LIGATION  44 years ago     OB History     Gravida  3   Para  3   Term      Preterm      AB      Living  3      SAB      IAB      Ectopic      Multiple      Live Births              Family History  Problem Relation Age of Onset   Hyperlipidemia Mother    Hypertension Mother     Social History   Tobacco Use   Smoking status: Some Days    Packs/day: 0.15    Years: 6.00    Pack years: 0.90    Types: Cigarettes   Smokeless tobacco: Never  Vaping Use   Vaping Use: Never used  Substance Use Topics   Alcohol use: Yes    Alcohol/week: 2.0 standard drinks    Types: 1 Glasses of wine, 1 Shots of liquor per week    Comment: 2-3 glasses of wine a day   Drug use: No    Home Medications Prior to Admission medications   Medication Sig Start Date End Date Taking? Authorizing Provider  albuterol (PROVENTIL HFA;VENTOLIN HFA) 108 (90 Base) MCG/ACT inhaler Inhale 2 puffs into the lungs every 6 (six) hours as needed for wheezing or shortness of breath. 08/05/16   Golden Circle, FNP  cholestyramine (QUESTRAN) 4 g packet Take 1 packet (4 g total) by mouth 2 (two) times daily. 12/14/16    Charlynne Cousins, MD  clobetasol cream (TEMOVATE) 2.37 % Apply 1 application topically 2 (two) times daily as needed (flare ups).    [provider]  [employer] MELATONIN 10 MG CAPS Take 10 mg by mouth at bedtime. 12/16/18   [provider]  cyclobenzaprine (FLEXERIL) 10 MG tablet Take 10 mg by mouth 3 (three) times daily as needed for muscle spasms.    [provider]  famotidine (PEPCID) 20 MG tablet Take 20 mg by mouth 2 (two) times daily.    [provider]  ferrous sulfate 325 (65 FE) MG tablet Take 1 tablet (325 mg total) by mouth 2 (two) times daily with  a meal. 10/27/16   Janece Canterbury, MD  Fluticasone-Salmeterol (ADVAIR DISKUS) 500-50 MCG/DOSE AEPB INHALE 1 PUFF INTO THE LUNGS TWICE A DAY Patient taking differently: Inhale 1 puff into the lungs 2 (two) times a day.  08/01/16   Golden Circle, FNP  folic acid (FOLVITE) 1 MG tablet Take 1 tablet (1 mg total) by mouth daily. 04/18/16   Ghimire, Henreitta Leber, MD  gabapentin (NEURONTIN) 100 MG capsule Take 100 mg by mouth 2 (two) times daily.    [provider]  Icosapent Ethyl (VASCEPA) 1 g CAPS Take 1 g by mouth 2 (two) times daily.    [provider]  Influenza vac split quadrivalent PF (FLUZONE HIGH-DOSE) 0.5 ML injection Inject 0.5 mLs into the muscle once.    [provider]  loratadine (CLARITIN) 10 MG tablet Take 10 mg by mouth daily.    [provider]  LORazepam (ATIVAN) 1 MG tablet Take 1 tablet (1 mg total) by mouth 2 (two) times daily as needed for anxiety (Anxiety, shakiness. DO NOT USE WITH ALCOHOL.). 08/01/16   Golden Circle, FNP  magnesium oxide (MAG-OX) 400 MG tablet Take 1 tablet (400 mg total) by mouth 2 (two) times daily. Patient not taking: Reported on 03/19/2019 03/17/18   Eugenie Filler, MD  montelukast (SINGULAIR) 10 MG tablet Take 10 mg by mouth daily. 01/04/19   [provider]  Multiple Vitamin (MULTIVITAMIN WITH MINERALS) TABS tablet Take 1  tablet by mouth daily. Patient not taking: Reported on 03/19/2019 04/18/16   Jonetta Osgood, MD  naloxegol oxalate (MOVANTIK) 25 MG TABS tablet Take 25 mg by mouth daily.    [provider]  ondansetron (ZOFRAN ODT) 4 MG disintegrating tablet Take 1 tablet (4 mg total) by mouth every 8 (eight) hours as needed for nausea. 03/30/19   Larene Pickett, PA-C  Oxycodone HCl 20 MG TABS Take 0.5 tablets (10 mg total) by mouth every 6 (six) hours as needed. Patient taking differently: Take 20 mg by mouth every 6 (six) hours as needed (pain).  03/17/18   Eugenie Filler, MD  pantoprazole (PROTONIX) 40 MG tablet Take 1 tablet (40 mg total) by mouth daily. 03/17/18   Eugenie Filler, MD  PARoxetine (PAXIL) 30 MG tablet Take 30 mg by mouth daily. 01/18/19   [provider]  pneumococcal 13-valent conjugate vaccine (PREVNAR 13) SUSP injection Inject 0.5 mLs into the muscle once.    [provider]  polyethylene glycol (MIRALAX / GLYCOLAX) packet Take 17 g by mouth daily. 03/18/18   Eugenie Filler, MD  potassium chloride SA (K-DUR,KLOR-CON) 20 MEQ tablet Take 1 tablet (20 mEq total) by mouth daily. 08/05/16   Golden Circle, FNP  propranolol (INDERAL) 10 MG tablet Take 1 tablet (10 mg total) by mouth 2 (two) times daily. 10/27/16   Janece Canterbury, MD  senna-docusate (SENOKOT-S) 8.6-50 MG tablet Take 1 tablet by mouth 2 (two) times daily. 03/17/18   Eugenie Filler, MD  simvastatin (ZOCOR) 40 MG tablet Take 1 tablet (40 mg total) by mouth every morning. 08/05/16   Golden Circle, FNP  sucralfate (CARAFATE) 1 g tablet Take 1 tablet by mouth 4 (four) times daily.    [provider]  thiamine 100 MG tablet Take 1 tablet (100 mg total) by mouth daily. 04/18/16   Ghimire, Henreitta Leber, MD  traZODone (DESYREL) 50 MG tablet Take 1 tablet (50 mg total) by mouth at bedtime. 03/17/18   Irine Seal  V, MD    Allergies    Azithromycin and Morphine and related  Review of Systems    Review of Systems  Musculoskeletal:  Positive for arthralgias.  All other systems reviewed and are negative.  Physical Exam Updated Vital Signs BP (!) 143/87   Pulse 93   Temp 99 F (37.2 C) (Oral)   Resp 17   SpO2 100%   Physical Exam Vitals and nursing note reviewed.  Constitutional:      Appearance: She is well-developed.  HENT:     Head: Normocephalic and atraumatic.  Eyes:     Conjunctiva/sclera: Conjunctivae normal.     Pupils: Pupils are equal, round, and reactive to light.  Cardiovascular:     Rate and Rhythm: Normal rate and regular rhythm.     Heart sounds: Normal heart sounds.  Pulmonary:     Effort: Pulmonary effort is normal. No respiratory distress.     Breath sounds: Normal breath sounds. No rhonchi.  Abdominal:     General: Bowel sounds are normal.     Palpations: Abdomen is soft.  Musculoskeletal:        General: Normal range of motion.     Cervical back: Normal range of motion.     Comments: Left ankle with swelling along the lateral malleolus and along the 5th metatarsal; no gross deformity noted; moving toes as normal, DP pulse intact, foot is warm and well perfused  Skin:    General: Skin is warm and dry.  Neurological:     Mental Status: She is alert and oriented to person, place, and time.    ED Results / Procedures / Treatments   Labs (all labs ordered are listed, but only abnormal results are displayed) Labs Reviewed - No data to display  EKG None  Radiology DG Ankle Complete Left  Result Date: 05/16/2021 CLINICAL DATA:  Left foot and ankle pain after fall at home. Swelling. EXAM: LEFT ANKLE COMPLETE - 3+ VIEW COMPARISON:  Ankle radiograph 04/03/2017 FINDINGS: There is no evidence of fracture, dislocation, or joint effusion. The bones are under mineralized. There is no evidence of arthropathy or other focal bone abnormality. Generalized soft tissue edema is more prominent laterally. IMPRESSION: Soft tissue edema without acute fracture or  subluxation. Electronically Signed   By: Keith Rake M.D.   On: 05/16/2021 23:32   DG Foot Complete Left  Result Date: 05/16/2021 CLINICAL DATA:  Left foot and ankle pain after fall at home. Swelling. EXAM: LEFT FOOT - COMPLETE 3+ VIEW COMPARISON:  None. FINDINGS: There is no evidence of fracture or dislocation. The bones are diffusely under mineralized. Minimal midfoot degenerative spurring. Small plantar calcaneal spur. IMPRESSION: No acute fracture or subluxation of the left foot. Electronically Signed   By: Keith Rake M.D.   On: 05/16/2021 23:32    Procedures Procedures   Medications Ordered in ED Medications - No data to display  ED Course  I have reviewed the triage vital signs and the nursing notes.  Pertinent labs & imaging results that were available during my care of the patient were reviewed by me and considered in my medical decision making (see chart for details).    MDM Rules/Calculators/A&P  75 year old female presenting to the ED after a fall that occurred yesterday.  States she was taking her dog outside and got tangled up in the leash.  She fell and twisted the left ankle/foot.  Denies any head injury or loss of consciousness.  She does have swelling along the  lateral malleolus and along the fifth metatarsal but there is no acute bony deformity.  Her foot is neurovascularly intact and she remains ambulatory.  X-rays are negative, suspect sprain type injury.  She was placed in ASO and remains ambulatory here in the ED with this.  Discharged home with symptomatic care.  Can follow-up with PCP.  She is already on home oxycodone, can continue this when needed.  Return here for new concerns.  Final Clinical Impression(s) / ED Diagnoses Final diagnoses:  Fall, initial encounter  Acute left ankle pain    Rx / DC Orders ED Discharge Orders     None        Larene Pickett, PA-C 05/17/21 0156    Drenda Freeze, MD 05/17/21 [phone]

## 2021-05-17 NOTE — Discharge Instructions (Addendum)
X-rays are normal. Wear brace and elevate ankle/foot to keep swelling down. Continue your home pain medication. Follow-up with your primary care doctor. Return here for new concerns.

## 2021-05-17 NOTE — ED Notes (Signed)
Pt ambulatory to bathroom. Pt states it hurts to bear weight on her left foot, but pt is able to bear weight

## 2022-06-28 ENCOUNTER — Other Ambulatory Visit: Payer: Self-pay

## 2022-06-28 ENCOUNTER — Encounter (HOSPITAL_COMMUNITY): Payer: Self-pay

## 2022-06-28 ENCOUNTER — Emergency Department (HOSPITAL_COMMUNITY): Payer: Medicare HMO

## 2022-06-28 ENCOUNTER — Inpatient Hospital Stay (HOSPITAL_COMMUNITY)
Admission: EM | Admit: 2022-06-28 | Discharge: 2022-07-05 | DRG: 917 | Disposition: A | Discharge status: Home Health | Payer: Medicare HMO | Attending: Internal Medicine | Admitting: Internal Medicine

## 2022-06-28 DIAGNOSIS — N139 Obstructive and reflux uropathy, unspecified: Secondary | ICD-10-CM

## 2022-06-28 DIAGNOSIS — R9431 Abnormal electrocardiogram [ECG] [EKG]: Secondary | ICD-10-CM

## 2022-06-28 DIAGNOSIS — Z905 Acquired absence of kidney: Secondary | ICD-10-CM

## 2022-06-28 DIAGNOSIS — E876 Hypokalemia: Secondary | ICD-10-CM | POA: Diagnosis present

## 2022-06-28 DIAGNOSIS — K219 Gastro-esophageal reflux disease without esophagitis: Secondary | ICD-10-CM | POA: Diagnosis present

## 2022-06-28 DIAGNOSIS — T402X1A Poisoning by other opioids, accidental (unintentional), initial encounter: Principal | ICD-10-CM | POA: Diagnosis present

## 2022-06-28 DIAGNOSIS — D649 Anemia, unspecified: Secondary | ICD-10-CM

## 2022-06-28 DIAGNOSIS — F1010 Alcohol abuse, uncomplicated: Secondary | ICD-10-CM | POA: Diagnosis present

## 2022-06-28 DIAGNOSIS — E785 Hyperlipidemia, unspecified: Secondary | ICD-10-CM | POA: Diagnosis present

## 2022-06-28 DIAGNOSIS — G9340 Encephalopathy, unspecified: Secondary | ICD-10-CM | POA: Diagnosis present

## 2022-06-28 DIAGNOSIS — K8689 Other specified diseases of pancreas: Secondary | ICD-10-CM

## 2022-06-28 DIAGNOSIS — R748 Abnormal levels of other serum enzymes: Secondary | ICD-10-CM

## 2022-06-28 DIAGNOSIS — R400 Somnolence: Secondary | ICD-10-CM

## 2022-06-28 DIAGNOSIS — F32A Depression, unspecified: Secondary | ICD-10-CM | POA: Diagnosis present

## 2022-06-28 DIAGNOSIS — L409 Psoriasis, unspecified: Secondary | ICD-10-CM | POA: Diagnosis present

## 2022-06-28 DIAGNOSIS — Y900 Blood alcohol level of less than 20 mg/100 ml: Secondary | ICD-10-CM | POA: Diagnosis present

## 2022-06-28 DIAGNOSIS — Y92003 Bedroom of unspecified non-institutional (private) residence as the place of occurrence of the external cause: Secondary | ICD-10-CM

## 2022-06-28 DIAGNOSIS — R64 Cachexia: Secondary | ICD-10-CM | POA: Diagnosis present

## 2022-06-28 DIAGNOSIS — Z888 Allergy status to other drugs, medicaments and biological substances status: Secondary | ICD-10-CM

## 2022-06-28 DIAGNOSIS — F17210 Nicotine dependence, cigarettes, uncomplicated: Secondary | ICD-10-CM | POA: Diagnosis present

## 2022-06-28 DIAGNOSIS — D500 Iron deficiency anemia secondary to blood loss (chronic): Secondary | ICD-10-CM

## 2022-06-28 DIAGNOSIS — C259 Malignant neoplasm of pancreas, unspecified: Secondary | ICD-10-CM | POA: Diagnosis not present

## 2022-06-28 DIAGNOSIS — G928 Other toxic encephalopathy: Secondary | ICD-10-CM | POA: Diagnosis present

## 2022-06-28 DIAGNOSIS — E7800 Pure hypercholesterolemia, unspecified: Secondary | ICD-10-CM | POA: Diagnosis present

## 2022-06-28 DIAGNOSIS — K55059 Acute (reversible) ischemia of intestine, part and extent unspecified: Secondary | ICD-10-CM | POA: Diagnosis present

## 2022-06-28 DIAGNOSIS — I878 Other specified disorders of veins: Secondary | ICD-10-CM | POA: Diagnosis present

## 2022-06-28 DIAGNOSIS — G4700 Insomnia, unspecified: Secondary | ICD-10-CM | POA: Diagnosis present

## 2022-06-28 DIAGNOSIS — E871 Hypo-osmolality and hyponatremia: Secondary | ICD-10-CM | POA: Diagnosis present

## 2022-06-28 DIAGNOSIS — Z85528 Personal history of other malignant neoplasm of kidney: Secondary | ICD-10-CM

## 2022-06-28 DIAGNOSIS — D509 Iron deficiency anemia, unspecified: Secondary | ICD-10-CM | POA: Diagnosis present

## 2022-06-28 DIAGNOSIS — R188 Other ascites: Secondary | ICD-10-CM | POA: Diagnosis present

## 2022-06-28 DIAGNOSIS — Z83438 Family history of other disorder of lipoprotein metabolism and other lipidemia: Secondary | ICD-10-CM

## 2022-06-28 DIAGNOSIS — Z885 Allergy status to narcotic agent status: Secondary | ICD-10-CM

## 2022-06-28 DIAGNOSIS — K7460 Unspecified cirrhosis of liver: Secondary | ICD-10-CM | POA: Diagnosis present

## 2022-06-28 DIAGNOSIS — N390 Urinary tract infection, site not specified: Secondary | ICD-10-CM | POA: Diagnosis present

## 2022-06-28 DIAGNOSIS — Z8249 Family history of ischemic heart disease and other diseases of the circulatory system: Secondary | ICD-10-CM

## 2022-06-28 DIAGNOSIS — I2510 Atherosclerotic heart disease of native coronary artery without angina pectoris: Secondary | ICD-10-CM | POA: Diagnosis present

## 2022-06-28 DIAGNOSIS — N179 Acute kidney failure, unspecified: Secondary | ICD-10-CM | POA: Diagnosis present

## 2022-06-28 DIAGNOSIS — I700 Atherosclerosis of aorta: Secondary | ICD-10-CM | POA: Diagnosis present

## 2022-06-28 DIAGNOSIS — B9620 Unspecified Escherichia coli [E. coli] as the cause of diseases classified elsewhere: Secondary | ICD-10-CM | POA: Diagnosis present

## 2022-06-28 DIAGNOSIS — Z681 Body mass index (BMI) 19 or less, adult: Secondary | ICD-10-CM

## 2022-06-28 DIAGNOSIS — R7989 Other specified abnormal findings of blood chemistry: Secondary | ICD-10-CM

## 2022-06-28 DIAGNOSIS — Z79899 Other long term (current) drug therapy: Secondary | ICD-10-CM

## 2022-06-28 DIAGNOSIS — M6282 Rhabdomyolysis: Secondary | ICD-10-CM

## 2022-06-28 DIAGNOSIS — K831 Obstruction of bile duct: Secondary | ICD-10-CM | POA: Diagnosis present

## 2022-06-28 NOTE — ED Triage Notes (Signed)
Arrives EMS from home after daughter suspected accidental over ingestion of oxycodone. Medication counts were WNL, Pupils 89m, equal and reactive.   Found on ground beside the bed. Unable to stand or ambulate. C/o left hip pain.   Denies head injury or LOC.

## 2022-06-28 NOTE — ED Provider Notes (Signed)
[name] DEPT Provider Note   CSN: [id number] Arrival date & time: 06/28/22  2242     History {Add pertinent medical, surgical, social history, OB history to HPI:1} Chief Complaint  Patient presents with   Hip Pain    [name] is a 76 y.o. female.  The history is provided by the patient and medical records. No language interpreter was used.  Hip Pain  [name] is a 76 y.o. female who presents to the Emergency Department complaining of hip pain.  Level V caveat due to AMS.  Per EMS patient arrives following a suspected accidental overdose.  Per EMS medication counts were normal.  She was found on the ground beside the bed, unable to stand or ambulate complaining of left hip pain.     Home Medications Prior to Admission medications   Medication Sig Start Date End Date Taking? Authorizing Provider  albuterol (PROVENTIL HFA;VENTOLIN HFA) 108 (90 Base) MCG/ACT inhaler Inhale 2 puffs into the lungs every 6 (six) hours as needed for wheezing or shortness of breath. 08/05/16   Golden Circle, FNP  cholestyramine (QUESTRAN) 4 g packet Take 1 packet (4 g total) by mouth 2 (two) times daily. 12/14/16   Charlynne Cousins, MD  clobetasol cream (TEMOVATE) 3.55 % Apply 1 application topically 2 (two) times daily as needed (flare ups).    [provider]  [employer] MELATONIN 10 MG CAPS Take 10 mg by mouth at bedtime. 12/16/18   [provider]  cyclobenzaprine (FLEXERIL) 10 MG tablet Take 10 mg by mouth 3 (three) times daily as needed for muscle spasms.    [provider]  famotidine (PEPCID) 20 MG tablet Take 20 mg by mouth 2 (two) times daily.    [provider]  ferrous sulfate 325 (65 FE) MG tablet Take 1 tablet (325 mg total) by mouth 2 (two) times daily with a meal. 10/27/16   Short, Noah Delaine, MD  Fluticasone-Salmeterol (ADVAIR DISKUS) 500-50 MCG/DOSE AEPB INHALE 1 PUFF INTO THE LUNGS TWICE A DAY Patient taking differently:  Inhale 1 puff into the lungs 2 (two) times a day.  08/01/16   Golden Circle, FNP  folic acid (FOLVITE) 1 MG tablet Take 1 tablet (1 mg total) by mouth daily. 04/18/16   Ghimire, Henreitta Leber, MD  gabapentin (NEURONTIN) 100 MG capsule Take 100 mg by mouth 2 (two) times daily.    [provider]  Icosapent Ethyl (VASCEPA) 1 g CAPS Take 1 g by mouth 2 (two) times daily.    [provider]  Influenza vac split quadrivalent PF (FLUZONE HIGH-DOSE) 0.5 ML injection Inject 0.5 mLs into the muscle once.    [provider]  loratadine (CLARITIN) 10 MG tablet Take 10 mg by mouth daily.    [provider]  LORazepam (ATIVAN) 1 MG tablet Take 1 tablet (1 mg total) by mouth 2 (two) times daily as needed for anxiety (Anxiety, shakiness. DO NOT USE WITH ALCOHOL.). 08/01/16   Golden Circle, FNP  magnesium oxide (MAG-OX) 400 MG tablet Take 1 tablet (400 mg total) by mouth 2 (two) times daily. Patient not taking: Reported on 03/19/2019 03/17/18   Eugenie Filler, MD  montelukast (SINGULAIR) 10 MG tablet Take 10 mg by mouth daily. 01/04/19   [provider]  Multiple Vitamin (MULTIVITAMIN WITH MINERALS) TABS tablet Take 1 tablet by mouth daily. Patient not taking: Reported on 03/19/2019 04/18/16   Jonetta Osgood, MD  naloxegol oxalate ([place])  25 MG TABS tablet Take 25 mg by mouth daily.    [provider]  ondansetron (ZOFRAN ODT) 4 MG disintegrating tablet Take 1 tablet (4 mg total) by mouth every 8 (eight) hours as needed for nausea. 03/30/19   Larene Pickett, PA-C  Oxycodone HCl 20 MG TABS Take 0.5 tablets (10 mg total) by mouth every 6 (six) hours as needed. Patient taking differently: Take 20 mg by mouth every 6 (six) hours as needed (pain).  03/17/18   Eugenie Filler, MD  pantoprazole (PROTONIX) 40 MG tablet Take 1 tablet (40 mg total) by mouth daily. 03/17/18   Eugenie Filler, MD  PARoxetine (PAXIL) 30 MG tablet Take 30 mg by mouth daily. 01/18/19    [provider]  pneumococcal 13-valent conjugate vaccine (PREVNAR 13) SUSP injection Inject 0.5 mLs into the muscle once.    [provider]  polyethylene glycol (MIRALAX / GLYCOLAX) packet Take 17 g by mouth daily. 03/18/18   Eugenie Filler, MD  potassium chloride SA (K-DUR,KLOR-CON) 20 MEQ tablet Take 1 tablet (20 mEq total) by mouth daily. 08/05/16   Golden Circle, FNP  propranolol (INDERAL) 10 MG tablet Take 1 tablet (10 mg total) by mouth 2 (two) times daily. 10/27/16   Janece Canterbury, MD  senna-docusate (SENOKOT-S) 8.6-50 MG tablet Take 1 tablet by mouth 2 (two) times daily. 03/17/18   Eugenie Filler, MD  simvastatin (ZOCOR) 40 MG tablet Take 1 tablet (40 mg total) by mouth every morning. 08/05/16   Golden Circle, FNP  sucralfate (CARAFATE) 1 g tablet Take 1 tablet by mouth 4 (four) times daily.    [provider]  thiamine 100 MG tablet Take 1 tablet (100 mg total) by mouth daily. 04/18/16   Ghimire, Henreitta Leber, MD  traZODone (DESYREL) 50 MG tablet Take 1 tablet (50 mg total) by mouth at bedtime. 03/17/18   Eugenie Filler, MD      Allergies    Azithromycin and Morphine and related    Review of Systems   Review of Systems  All other systems reviewed and are negative.   Physical Exam Updated Vital Signs BP 137/68 (BP Location: Left Arm)   Pulse 85   Temp 97.9 F (36.6 C) (Oral)   Resp 18   Ht '5\' 8"'$  (1.727 m)   Wt 59 kg   SpO2 100%   BMI 19.77 kg/m  Physical Exam Vitals and nursing note reviewed.  Constitutional:      Appearance: She is well-developed.     Comments: Lethargic  HENT:     Head: Normocephalic and atraumatic.     Comments: Pupils midsize and reactive Cardiovascular:     Rate and Rhythm: Normal rate and regular rhythm.     Heart sounds: No murmur heard. Pulmonary:     Effort: Pulmonary effort is normal. No respiratory distress.     Breath sounds: Normal breath sounds.  Abdominal:     Palpations: Abdomen is soft.      Tenderness: There is no abdominal tenderness. There is no guarding or rebound.  Musculoskeletal:        General: No tenderness.     Comments: 2+ femoral and DP pulses bilaterally.  There is mild tenderness to palpation over the right knee and pain with range of motion of the right knee.  There is no significant tenderness to palpation over the hips bilaterally.  Left lower extremity is externally rotated and shortened.  Skin:    General:  Skin is warm and dry.  Neurological:     Comments: Lethargic.  Dysarthric speech.  Arouses to painful stimuli.  Moves all extremities weakly.  Psychiatric:     Comments: Unable to assess     ED Results / Procedures / Treatments   Labs (all labs ordered are listed, but only abnormal results are displayed) Labs Reviewed  URINALYSIS, ROUTINE W REFLEX MICROSCOPIC  COMPREHENSIVE METABOLIC PANEL  ETHANOL  ACETAMINOPHEN LEVEL  SALICYLATE LEVEL  CBC WITH DIFFERENTIAL/PLATELET  BLOOD GAS, VENOUS  CBG MONITORING, ED    EKG None  Radiology No results found.  Procedures Procedures  {Document cardiac monitor, telemetry assessment procedure when appropriate:1}  Medications Ordered in ED Medications - No data to display  ED Course/ Medical Decision Making/ A&P                           Medical Decision Making Amount and/or Complexity of Data Reviewed Labs: ordered. Radiology: ordered.   ***  {Document critical care time when appropriate:1} {Document review of labs and clinical decision tools ie heart score, Chads2Vasc2 etc:1}  {Document your independent review of radiology images, and any outside records:1} {Document your discussion with family members, caretakers, and with consultants:1} {Document social determinants of health affecting pt's care:1} {Document your decision making why or why not admission, treatments were needed:1} Final Clinical Impression(s) / ED Diagnoses Final diagnoses:  None    Rx / DC Orders ED Discharge Orders      None

## 2022-06-29 ENCOUNTER — Observation Stay (HOSPITAL_COMMUNITY): Payer: Medicare HMO

## 2022-06-29 ENCOUNTER — Encounter (HOSPITAL_COMMUNITY): Payer: Self-pay

## 2022-06-29 ENCOUNTER — Emergency Department (HOSPITAL_COMMUNITY): Payer: Medicare HMO

## 2022-06-29 DIAGNOSIS — R64 Cachexia: Secondary | ICD-10-CM | POA: Diagnosis present

## 2022-06-29 DIAGNOSIS — R748 Abnormal levels of other serum enzymes: Secondary | ICD-10-CM

## 2022-06-29 DIAGNOSIS — F1010 Alcohol abuse, uncomplicated: Secondary | ICD-10-CM | POA: Diagnosis present

## 2022-06-29 DIAGNOSIS — G928 Other toxic encephalopathy: Secondary | ICD-10-CM | POA: Diagnosis present

## 2022-06-29 DIAGNOSIS — C259 Malignant neoplasm of pancreas, unspecified: Secondary | ICD-10-CM | POA: Diagnosis present

## 2022-06-29 DIAGNOSIS — T402X1A Poisoning by other opioids, accidental (unintentional), initial encounter: Secondary | ICD-10-CM | POA: Diagnosis present

## 2022-06-29 DIAGNOSIS — R188 Other ascites: Secondary | ICD-10-CM | POA: Diagnosis present

## 2022-06-29 DIAGNOSIS — Y92003 Bedroom of unspecified non-institutional (private) residence as the place of occurrence of the external cause: Secondary | ICD-10-CM | POA: Diagnosis not present

## 2022-06-29 DIAGNOSIS — Z885 Allergy status to narcotic agent status: Secondary | ICD-10-CM | POA: Diagnosis not present

## 2022-06-29 DIAGNOSIS — N390 Urinary tract infection, site not specified: Secondary | ICD-10-CM | POA: Diagnosis present

## 2022-06-29 DIAGNOSIS — I82890 Acute embolism and thrombosis of other specified veins: Secondary | ICD-10-CM | POA: Diagnosis not present

## 2022-06-29 DIAGNOSIS — E7800 Pure hypercholesterolemia, unspecified: Secondary | ICD-10-CM | POA: Diagnosis not present

## 2022-06-29 DIAGNOSIS — K7460 Unspecified cirrhosis of liver: Secondary | ICD-10-CM | POA: Diagnosis present

## 2022-06-29 DIAGNOSIS — N179 Acute kidney failure, unspecified: Secondary | ICD-10-CM | POA: Diagnosis present

## 2022-06-29 DIAGNOSIS — K55059 Acute (reversible) ischemia of intestine, part and extent unspecified: Secondary | ICD-10-CM | POA: Diagnosis present

## 2022-06-29 DIAGNOSIS — G9340 Encephalopathy, unspecified: Secondary | ICD-10-CM | POA: Diagnosis not present

## 2022-06-29 DIAGNOSIS — E785 Hyperlipidemia, unspecified: Secondary | ICD-10-CM | POA: Diagnosis present

## 2022-06-29 DIAGNOSIS — Y900 Blood alcohol level of less than 20 mg/100 ml: Secondary | ICD-10-CM | POA: Diagnosis present

## 2022-06-29 DIAGNOSIS — M6282 Rhabdomyolysis: Secondary | ICD-10-CM | POA: Diagnosis present

## 2022-06-29 DIAGNOSIS — E876 Hypokalemia: Secondary | ICD-10-CM | POA: Diagnosis present

## 2022-06-29 DIAGNOSIS — K831 Obstruction of bile duct: Secondary | ICD-10-CM | POA: Diagnosis present

## 2022-06-29 DIAGNOSIS — K8689 Other specified diseases of pancreas: Secondary | ICD-10-CM

## 2022-06-29 DIAGNOSIS — N139 Obstructive and reflux uropathy, unspecified: Secondary | ICD-10-CM | POA: Diagnosis not present

## 2022-06-29 DIAGNOSIS — J45909 Unspecified asthma, uncomplicated: Secondary | ICD-10-CM | POA: Diagnosis not present

## 2022-06-29 DIAGNOSIS — B9620 Unspecified Escherichia coli [E. coli] as the cause of diseases classified elsewhere: Secondary | ICD-10-CM | POA: Diagnosis present

## 2022-06-29 DIAGNOSIS — I878 Other specified disorders of veins: Secondary | ICD-10-CM | POA: Diagnosis present

## 2022-06-29 DIAGNOSIS — I700 Atherosclerosis of aorta: Secondary | ICD-10-CM | POA: Diagnosis present

## 2022-06-29 DIAGNOSIS — F32A Depression, unspecified: Secondary | ICD-10-CM | POA: Diagnosis present

## 2022-06-29 DIAGNOSIS — F17210 Nicotine dependence, cigarettes, uncomplicated: Secondary | ICD-10-CM | POA: Diagnosis not present

## 2022-06-29 DIAGNOSIS — D509 Iron deficiency anemia, unspecified: Secondary | ICD-10-CM | POA: Diagnosis present

## 2022-06-29 DIAGNOSIS — R9431 Abnormal electrocardiogram [ECG] [EKG]: Secondary | ICD-10-CM

## 2022-06-29 DIAGNOSIS — E871 Hypo-osmolality and hyponatremia: Secondary | ICD-10-CM | POA: Diagnosis present

## 2022-06-29 DIAGNOSIS — Z681 Body mass index (BMI) 19 or less, adult: Secondary | ICD-10-CM | POA: Diagnosis not present

## 2022-06-29 DIAGNOSIS — Z888 Allergy status to other drugs, medicaments and biological substances status: Secondary | ICD-10-CM | POA: Diagnosis not present

## 2022-06-29 LAB — CBC WITH DIFFERENTIAL/PLATELET
Abs Immature Granulocytes: 0.06 10*3/uL (ref 0.00–0.07)
Basophils Absolute: 0 10*3/uL (ref 0.0–0.1)
Basophils Relative: 0 %
Eosinophils Absolute: 0 10*3/uL (ref 0.0–0.5)
Eosinophils Relative: 0 %
HCT: 29.6 % — ABNORMAL LOW (ref 36.0–46.0)
Hemoglobin: 10 g/dL — ABNORMAL LOW (ref 12.0–15.0)
Immature Granulocytes: 0 %
Lymphocytes Relative: 9 %
Lymphs Abs: 1.3 10*3/uL (ref 0.7–4.0)
MCH: 24.9 pg — ABNORMAL LOW (ref 26.0–34.0)
MCHC: 33.8 g/dL (ref 30.0–36.0)
MCV: 73.8 fL — ABNORMAL LOW (ref 80.0–100.0)
Monocytes Absolute: 1.6 10*3/uL — ABNORMAL HIGH (ref 0.1–1.0)
Monocytes Relative: 10 %
Neutro Abs: 12 10*3/uL — ABNORMAL HIGH (ref 1.7–7.7)
Neutrophils Relative %: 81 %
Platelets: 426 10*3/uL — ABNORMAL HIGH (ref 150–400)
RBC: 4.01 MIL/uL (ref 3.87–5.11)
RDW: 20.6 % — ABNORMAL HIGH (ref 11.5–15.5)
WBC: 15.1 10*3/uL — ABNORMAL HIGH (ref 4.0–10.5)
nRBC: 0 % (ref 0.0–0.2)

## 2022-06-29 LAB — RETICULOCYTES
Immature Retic Fract: 22.5 % — ABNORMAL HIGH (ref 2.3–15.9)
RBC.: 3.68 MIL/uL — ABNORMAL LOW (ref 3.87–5.11)
Retic Count, Absolute: 85.4 10*3/uL (ref 19.0–186.0)
Retic Ct Pct: 2.3 % (ref 0.4–3.1)

## 2022-06-29 LAB — CBC
HCT: 27 % — ABNORMAL LOW (ref 36.0–46.0)
Hemoglobin: 8.8 g/dL — ABNORMAL LOW (ref 12.0–15.0)
MCH: 24.4 pg — ABNORMAL LOW (ref 26.0–34.0)
MCHC: 32.6 g/dL (ref 30.0–36.0)
MCV: 75 fL — ABNORMAL LOW (ref 80.0–100.0)
Platelets: 421 10*3/uL — ABNORMAL HIGH (ref 150–400)
RBC: 3.6 MIL/uL — ABNORMAL LOW (ref 3.87–5.11)
RDW: 20.8 % — ABNORMAL HIGH (ref 11.5–15.5)
WBC: 15 10*3/uL — ABNORMAL HIGH (ref 4.0–10.5)
nRBC: 0 % (ref 0.0–0.2)

## 2022-06-29 LAB — BLOOD GAS, VENOUS
Acid-Base Excess: 14.6 mmol/L — ABNORMAL HIGH (ref 0.0–2.0)
Bicarbonate: 40.4 mmol/L — ABNORMAL HIGH (ref 20.0–28.0)
O2 Saturation: 73.6 %
Patient temperature: 37
pCO2, Ven: 53 mmHg (ref 44–60)
pH, Ven: 7.49 — ABNORMAL HIGH (ref 7.25–7.43)
pO2, Ven: 45 mmHg (ref 32–45)

## 2022-06-29 LAB — IRON AND TIBC
Iron: 23 ug/dL — ABNORMAL LOW (ref 28–170)
Saturation Ratios: 7 % — ABNORMAL LOW (ref 10.4–31.8)
TIBC: 343 ug/dL (ref 250–450)
UIBC: 320 ug/dL

## 2022-06-29 LAB — URINALYSIS, ROUTINE W REFLEX MICROSCOPIC
Bilirubin Urine: NEGATIVE
Glucose, UA: NEGATIVE mg/dL
Ketones, ur: NEGATIVE mg/dL
Leukocytes,Ua: NEGATIVE
Nitrite: POSITIVE — AB
Protein, ur: NEGATIVE mg/dL
Specific Gravity, Urine: 1.011 (ref 1.005–1.030)
pH: 6 (ref 5.0–8.0)

## 2022-06-29 LAB — COMPREHENSIVE METABOLIC PANEL
ALT: 74 U/L — ABNORMAL HIGH (ref 0–44)
ALT: 73 U/L — ABNORMAL HIGH (ref 0–44)
AST: 186 U/L — ABNORMAL HIGH (ref 15–41)
AST: 212 U/L — ABNORMAL HIGH (ref 15–41)
Albumin: 2.9 g/dL — ABNORMAL LOW (ref 3.5–5.0)
Albumin: 2.7 g/dL — ABNORMAL LOW (ref 3.5–5.0)
Alkaline Phosphatase: 512 U/L — ABNORMAL HIGH (ref 38–126)
Alkaline Phosphatase: 481 U/L — ABNORMAL HIGH (ref 38–126)
Anion gap: 16 — ABNORMAL HIGH (ref 5–15)
Anion gap: 13 (ref 5–15)
BUN: 21 mg/dL (ref 8–23)
BUN: 20 mg/dL (ref 8–23)
CO2: 29 mmol/L (ref 22–32)
CO2: 30 mmol/L (ref 22–32)
Calcium: 8.5 mg/dL — ABNORMAL LOW (ref 8.9–10.3)
Calcium: 8.1 mg/dL — ABNORMAL LOW (ref 8.9–10.3)
Chloride: 86 mmol/L — ABNORMAL LOW (ref 98–111)
Chloride: 91 mmol/L — ABNORMAL LOW (ref 98–111)
Creatinine, Ser: 1.2 mg/dL — ABNORMAL HIGH (ref 0.44–1.00)
Creatinine, Ser: 1.05 mg/dL — ABNORMAL HIGH (ref 0.44–1.00)
GFR, Estimated: 48 mL/min — ABNORMAL LOW (ref >60)
GFR, Estimated: 56 mL/min — ABNORMAL LOW (ref >60)
Glucose, Bld: 95 mg/dL (ref 70–99)
Glucose, Bld: 90 mg/dL (ref 70–99)
Potassium: 2.2 mmol/L — CL (ref 3.5–5.1)
Potassium: 2.1 mmol/L — CL (ref 3.5–5.1)
Sodium: 131 mmol/L — ABNORMAL LOW (ref 135–145)
Sodium: 134 mmol/L — ABNORMAL LOW (ref 135–145)
Total Bilirubin: 8.2 mg/dL — ABNORMAL HIGH (ref 0.3–1.2)
Total Bilirubin: 7.6 mg/dL — ABNORMAL HIGH (ref 0.3–1.2)
Total Protein: 7.1 g/dL (ref 6.5–8.1)
Total Protein: 6.3 g/dL — ABNORMAL LOW (ref 6.5–8.1)

## 2022-06-29 LAB — FERRITIN: Ferritin: 9 ng/mL — ABNORMAL LOW (ref 11–307)

## 2022-06-29 LAB — ETHANOL: Alcohol, Ethyl (B): <10 mg/dL (ref <10)

## 2022-06-29 LAB — LIPASE, BLOOD: Lipase: 49 U/L (ref 11–51)

## 2022-06-29 LAB — VITAMIN B12: Vitamin B-12: 7454 pg/mL — ABNORMAL HIGH (ref 180–914)

## 2022-06-29 LAB — ACETAMINOPHEN LEVEL: Acetaminophen (Tylenol), Serum: <10 ug/mL — ABNORMAL LOW (ref 10–30)

## 2022-06-29 LAB — CK
Total CK: 1903 U/L — ABNORMAL HIGH (ref 38–234)
Total CK: 3101 U/L — ABNORMAL HIGH (ref 38–234)

## 2022-06-29 LAB — CBG MONITORING, ED: Glucose-Capillary: 102 mg/dL — ABNORMAL HIGH (ref 70–99)

## 2022-06-29 LAB — LACTIC ACID, PLASMA
Lactic Acid, Venous: 1.1 mmol/L (ref 0.5–1.9)
Lactic Acid, Venous: 2.5 mmol/L (ref 0.5–1.9)

## 2022-06-29 LAB — PROTIME-INR
INR: 1.4 — ABNORMAL HIGH (ref 0.8–1.2)
Prothrombin Time: 16.7 seconds — ABNORMAL HIGH (ref 11.4–15.2)

## 2022-06-29 LAB — SALICYLATE LEVEL: Salicylate Lvl: <7 mg/dL — ABNORMAL LOW (ref 7.0–30.0)

## 2022-06-29 LAB — AMMONIA: Ammonia: 59 umol/L — ABNORMAL HIGH (ref 9–35)

## 2022-06-29 LAB — MAGNESIUM: Magnesium: 2 mg/dL (ref 1.7–2.4)

## 2022-06-29 LAB — FOLATE: Folate: >40 ng/mL (ref >5.9)

## 2022-06-29 MED ORDER — THIAMINE HCL 100 MG/ML IJ SOLN
100.0000 mg | Freq: Every day | INTRAMUSCULAR | Status: DC
  Administered 2022-07-02: 100 mg via INTRAVENOUS
  Filled 2022-06-29: qty 2

## 2022-06-29 MED ORDER — METRONIDAZOLE 500 MG/100ML IV SOLN
500.0000 mg | Freq: Two times a day (BID) | INTRAVENOUS | Status: DC
  Administered 2022-06-29 – 2022-07-01 (×4): 500 mg via INTRAVENOUS
  Filled 2022-06-29 (×4): qty 100

## 2022-06-29 MED ORDER — LACTULOSE 10 GM/15ML PO SOLN: 20.0000 g | Freq: Once | ORAL | Status: DC

## 2022-06-29 MED ORDER — GADOBUTROL 1 MMOL/ML IV SOLN
6.0000 mL | Freq: Once | INTRAVENOUS | Status: AC | PRN
Start: 2022-06-29 — End: 2022-06-29
  Administered 2022-06-29: 6 mL via INTRAVENOUS

## 2022-06-29 MED ORDER — LACTATED RINGERS IV SOLN: INTRAVENOUS | Status: DC

## 2022-06-29 MED ORDER — LACTATED RINGERS IV BOLUS
250.0000 mL | Freq: Once | INTRAVENOUS | Status: AC
  Administered 2022-06-29: 250 mL via INTRAVENOUS

## 2022-06-29 MED ORDER — POTASSIUM CHLORIDE 10 MEQ/100ML IV SOLN
10.0000 meq | INTRAVENOUS | Status: AC
  Administered 2022-06-29 (×4): 10 meq via INTRAVENOUS
  Filled 2022-06-29 (×4): qty 100

## 2022-06-29 MED ORDER — POTASSIUM CHLORIDE 20 MEQ PO PACK
40.0000 meq | PACK | Freq: Once | ORAL | Status: AC
  Administered 2022-06-29: 40 meq via ORAL
  Filled 2022-06-29: qty 2

## 2022-06-29 MED ORDER — SODIUM CHLORIDE 0.9 % IV SOLN
1.0000 g | INTRAVENOUS | Status: DC
  Administered 2022-06-29: 1 g via INTRAVENOUS
  Filled 2022-06-29: qty 10

## 2022-06-29 MED ORDER — THIAMINE HCL 100 MG PO TABS
100.0000 mg | ORAL_TABLET | Freq: Every day | ORAL | Status: DC
  Administered 2022-06-29 – 2022-07-05 (×5): 100 mg via ORAL
  Filled 2022-06-29 (×6): qty 1

## 2022-06-29 MED ORDER — ADULT MULTIVITAMIN W/MINERALS CH
1.0000 | ORAL_TABLET | Freq: Every day | ORAL | Status: DC
  Administered 2022-06-29 – 2022-07-05 (×6): 1 via ORAL
  Filled 2022-06-29 (×6): qty 1

## 2022-06-29 MED ORDER — VANCOMYCIN HCL IN DEXTROSE 1-5 GM/200ML-% IV SOLN: 1000.0000 mg | INTRAVENOUS | Status: DC

## 2022-06-29 MED ORDER — VANCOMYCIN HCL 1250 MG/250ML IV SOLN
1250.0000 mg | Freq: Once | INTRAVENOUS | Status: AC
  Administered 2022-06-29: 1250 mg via INTRAVENOUS
  Filled 2022-06-29: qty 250

## 2022-06-29 MED ORDER — SODIUM CHLORIDE (PF) 0.9 % IJ SOLN
INTRAMUSCULAR | Status: AC
  Filled 2022-06-29: qty 50

## 2022-06-29 MED ORDER — IOHEXOL 300 MG/ML  SOLN
80.0000 mL | Freq: Once | INTRAMUSCULAR | Status: AC | PRN
  Administered 2022-06-29: 80 mL via INTRAVENOUS

## 2022-06-29 MED ORDER — LACTATED RINGERS IV BOLUS
250.0000 mL | Freq: Once | INTRAVENOUS | Status: AC
Start: 2022-06-29 — End: 2022-06-29
  Administered 2022-06-29: 250 mL via INTRAVENOUS

## 2022-06-29 MED ORDER — LACTULOSE ENEMA
300.0000 mL | Freq: Once | ORAL | Status: DC
  Filled 2022-06-29: qty 300

## 2022-06-29 MED ORDER — FOLIC ACID 1 MG PO TABS
1.0000 mg | ORAL_TABLET | Freq: Every day | ORAL | Status: DC
  Administered 2022-06-29 – 2022-07-01 (×3): 1 mg via ORAL
  Filled 2022-06-29 (×3): qty 1

## 2022-06-29 MED ORDER — SODIUM CHLORIDE 0.9 % IV SOLN
2.0000 g | Freq: Two times a day (BID) | INTRAVENOUS | Status: DC
  Administered 2022-06-29 – 2022-06-30 (×2): 2 g via INTRAVENOUS
  Filled 2022-06-29 (×2): qty 12.5

## 2022-06-29 MED ORDER — POTASSIUM CHLORIDE 10 MEQ/100ML IV SOLN
10.0000 meq | INTRAVENOUS | Status: AC
  Administered 2022-06-29 (×5): 10 meq via INTRAVENOUS
  Filled 2022-06-29 (×5): qty 100

## 2022-06-29 NOTE — ED Notes (Signed)
Patient transported to MRI. Potassium and fluids paused while pt is gone.

## 2022-06-29 NOTE — H&P (Signed)
History and Physical    [name]:[id number] DOB: December 04, 1945 DOA: 06/28/2022  PCP: Lois Huxley, PA  Patient coming from: Home  Chief Complaint: Altered mental status  HPI: [name] is a 76 y.o. female with medical history significant of alcohol abuse, chronic pain with history of oxycodone overdose in the past, depression, asthma, GERD, hyperlipidemia, psoriasis, left renal cell carcinoma status post partial nephrectomy in 2012, history of cholecystectomy presents to the ED from home after daughter suspected accidental oxycodone overdose.  She was found on the ground beside her bed and was not able to stand or ambulate.  Complained of left hip pain.  Per EMS, medication counts were normal.  Pupils equal and reactive.  Patient was lethargic on initial evaluation done in the ED but later became more awake and alert.  Vital signs stable.  No Narcan given.  Daughter told ED physician that patient has been abusing oxycodone for years and also drinks alcohol heavily.  Labs showing WBC 15.1, hemoglobin 10.0 with MCV 73.8 (hemoglobin previously normal 3 years ago), platelet count 426k (chronic mild elevation), sodium 131, potassium 2.2, chloride 86, creatinine 1.2 (baseline 0.6-0.8), calcium 8.5, albumin 2.9, AST 186, ALT 74, alkaline phosphatase 512, T. bili 8.2, lipase normal, INR 1.4, magnesium normal, CK 1903, blood ethanol level undetectable, acetaminophen level undetectable, salicylate level undetectable, VBG with pH 7.49 and PCO2 53, ammonia 59, lactic acid normal.  Blood cultures drawn.  UA and urine culture pending.  Chest x-ray showing no active disease.  X-ray of right knee negative for fracture or dislocation.  X-ray of hips showing bony deformity of the inferior pubic ramus on the left which is new from 2020, suggesting old healed fracture.  Also showing old healed fracture of the distal left femur with hardware in place.  CT head and C-spine negative for acute finding.  Patient had  abdominal tenderness on exam.  CT abdomen pelvis with contrast showing: "IMPRESSION: 1. Hypodense masslike region in the pancreatic head with surrounding fat stranding measuring 6.5 x 4.8 x 6.5 cm resulting in biliary and pancreatic duct ductal dilatation. Findings are concerning for pancreatic adenocarcinoma versus pancreatitis with developing abscess. Correlation with amylase and lipase and CA 19-9 is recommended. MRI with contrast is suggested for further evaluation. 2. 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 3. Morphologic changes of cirrhosis in the liver. 4. Mild-to-moderate hydroureteronephrosis on the right with ureteral enhancement. A 3 mm calculus is present in the anticipated region of the distal right ureter, possible ureteral calculus or phlebolith. Clinical correlation is recommended to exclude superimposed infection. 5. Aortic atherosclerosis."  Patient was given potassium supplement and IV fluids.  Patient currently somnolent but able to wake up briefly and answer a few questions.  She reports taking oxycodone 1 tablet every 4 hours for stomach pain and denies taking any extra pills.  Denies fevers, cough, shortness of breath, or chest pain.  Not able to give any additional history.  Review of Systems:  Review of Systems  All other systems reviewed and are negative.   Past Medical History:  Diagnosis Date   Alcohol abuse    Alcohol abuse    Allergy    Arthritis    back-severe, hips, right knee   Asthma    GERD (gastroesophageal reflux disease)    occasional   H/O measles    H/O mumps    Hypercholesteremia    under control   Insomnia  Psoriasis (a type of skin inflammation)    Renal cell carcinoma 2012   left   Seasonal allergies     Past Surgical History:  Procedure Laterality Date   ABDOMINAL HYSTERECTOMY  40years ago   BUNIONECTOMY  04/2011   CHOLECYSTECTOMY  11/13/2011   Procedure: LAPAROSCOPIC  CHOLECYSTECTOMY WITH INTRAOPERATIVE CHOLANGIOGRAM;  Surgeon: Judieth Keens, DO;  Location: [facility];  Service: General;  Laterality: N/A;   COLONOSCOPY N/A 09/25/2013   Procedure: COLONOSCOPY;  Surgeon: Lear Ng, MD;  Location: [facility];  Service: Endoscopy;  Laterality: N/A;   ESOPHAGOGASTRODUODENOSCOPY N/A 09/25/2013   Procedure: ESOPHAGOGASTRODUODENOSCOPY (EGD);  Surgeon: Lear Ng, MD;  Location: [facility];  Service: Endoscopy;  Laterality: N/A;   ESOPHAGOGASTRODUODENOSCOPY N/A 04/07/2016   Procedure: ESOPHAGOGASTRODUODENOSCOPY (EGD);  Surgeon: Milus Banister, MD;  Location: [facility];  Service: Endoscopy;  Laterality: N/A;   ESOPHAGOGASTRODUODENOSCOPY N/A 10/26/2016   Procedure: ESOPHAGOGASTRODUODENOSCOPY (EGD);  Surgeon: Arta Silence, MD;  Location: [facility];  Service: Endoscopy;  Laterality: N/A;   ESOPHAGOGASTRODUODENOSCOPY N/A 12/07/2016   Procedure: ESOPHAGOGASTRODUODENOSCOPY (EGD);  Surgeon: Wonda Horner, MD;  Location: [facility];  Service: Endoscopy;  Laterality: N/A;   ESOPHAGOGASTRODUODENOSCOPY (EGD) WITH PROPOFOL Left 01/03/2016   Procedure: ESOPHAGOGASTRODUODENOSCOPY (EGD) WITH PROPOFOL;  Surgeon: Arta Silence, MD;  Location: [facility];  Service: Endoscopy;  Laterality: Left;   FEMUR IM NAIL Left 04/15/2016   Procedure: INTRAMEDULLARY (IM) RETROGRADE FEMORAL NAILING;  Surgeon: Gaynelle Arabian, MD;  Location: [facility];  Service: Orthopedics;  Laterality: Left;   HERNIA REPAIR  07/4708   supraumbilical repair   KIDNEY SURGERY  12/2010   [facility]; partial nephrectomy   LUMBAR LAMINECTOMY/DECOMPRESSION MICRODISCECTOMY Right 10/12/2014   Procedure: HEMI LAMINECTOMY MICRODISCECTOMY L5-S1 RIGHT (1 LEVEL);  Surgeon: Tobi Bastos, MD;  Location: [facility];  Service: Orthopedics;  Laterality: Right;   MENISECTOMY  2010   left knee   TUBAL LIGATION  44 years ago     reports that she has been smoking cigarettes. She has a 0.90 pack-year smoking  history. She has never used smokeless tobacco. She reports current alcohol use of about 2.0 standard drinks of alcohol per week. She reports that she does not use drugs.  Allergies  Allergen Reactions   Azithromycin Itching and Swelling   Morphine And Related Itching    Family History  Problem Relation Age of Onset   Hyperlipidemia Mother    Hypertension Mother     Prior to Admission medications   Medication Sig Start Date End Date Taking? Authorizing Provider  albuterol (PROVENTIL HFA;VENTOLIN HFA) 108 (90 Base) MCG/ACT inhaler Inhale 2 puffs into the lungs every 6 (six) hours as needed for wheezing or shortness of breath. 08/05/16   Golden Circle, FNP  cholestyramine (QUESTRAN) 4 g packet Take 1 packet (4 g total) by mouth 2 (two) times daily. 12/14/16   Charlynne Cousins, MD  clobetasol cream (TEMOVATE) 6.28 % Apply 1 application topically 2 (two) times daily as needed (flare ups).    [provider]  [employer] MELATONIN 10 MG CAPS Take 10 mg by mouth at bedtime. 12/16/18   [provider]  cyclobenzaprine (FLEXERIL) 10 MG tablet Take 10 mg by mouth 3 (three) times daily as needed for muscle spasms.    [provider]  famotidine (PEPCID) 20 MG tablet Take 20 mg by mouth 2 (two) times daily.    [provider]  ferrous sulfate 325 (65 FE) MG tablet Take 1 tablet (325 mg total) by  mouth 2 (two) times daily with a meal. 10/27/16   Janece Canterbury, MD  Fluticasone-Salmeterol (ADVAIR DISKUS) 500-50 MCG/DOSE AEPB INHALE 1 PUFF INTO THE LUNGS TWICE A DAY Patient taking differently: Inhale 1 puff into the lungs 2 (two) times a day.  08/01/16   Golden Circle, FNP  folic acid (FOLVITE) 1 MG tablet Take 1 tablet (1 mg total) by mouth daily. 04/18/16   Ghimire, Henreitta Leber, MD  gabapentin (NEURONTIN) 100 MG capsule Take 100 mg by mouth 2 (two) times daily.    [provider]  Icosapent Ethyl (VASCEPA) 1 g CAPS Take 1 g by mouth 2 (two) times daily.     [provider]  Influenza vac split quadrivalent PF (FLUZONE HIGH-DOSE) 0.5 ML injection Inject 0.5 mLs into the muscle once.    [provider]  loratadine (CLARITIN) 10 MG tablet Take 10 mg by mouth daily.    [provider]  LORazepam (ATIVAN) 1 MG tablet Take 1 tablet (1 mg total) by mouth 2 (two) times daily as needed for anxiety (Anxiety, shakiness. DO NOT USE WITH ALCOHOL.). 08/01/16   Golden Circle, FNP  magnesium oxide (MAG-OX) 400 MG tablet Take 1 tablet (400 mg total) by mouth 2 (two) times daily. Patient not taking: Reported on 03/19/2019 03/17/18   Eugenie Filler, MD  montelukast (SINGULAIR) 10 MG tablet Take 10 mg by mouth daily. 01/04/19   [provider]  Multiple Vitamin (MULTIVITAMIN WITH MINERALS) TABS tablet Take 1 tablet by mouth daily. Patient not taking: Reported on 03/19/2019 04/18/16   Jonetta Osgood, MD  naloxegol oxalate (MOVANTIK) 25 MG TABS tablet Take 25 mg by mouth daily.    [provider]  ondansetron (ZOFRAN ODT) 4 MG disintegrating tablet Take 1 tablet (4 mg total) by mouth every 8 (eight) hours as needed for nausea. 03/30/19   Larene Pickett, PA-C  Oxycodone HCl 20 MG TABS Take 0.5 tablets (10 mg total) by mouth every 6 (six) hours as needed. Patient taking differently: Take 20 mg by mouth every 6 (six) hours as needed (pain).  03/17/18   Eugenie Filler, MD  pantoprazole (PROTONIX) 40 MG tablet Take 1 tablet (40 mg total) by mouth daily. 03/17/18   Eugenie Filler, MD  PARoxetine (PAXIL) 30 MG tablet Take 30 mg by mouth daily. 01/18/19   [provider]  pneumococcal 13-valent conjugate vaccine (PREVNAR 13) SUSP injection Inject 0.5 mLs into the muscle once.    [provider]  polyethylene glycol (MIRALAX / GLYCOLAX) packet Take 17 g by mouth daily. 03/18/18   Eugenie Filler, MD  potassium chloride SA (K-DUR,KLOR-CON) 20 MEQ tablet Take 1 tablet (20 mEq total) by mouth daily. 08/05/16   Golden Circle, FNP  propranolol (INDERAL) 10 MG tablet Take 1 tablet (10 mg total) by mouth 2 (two) times daily. 10/27/16   Janece Canterbury, MD  senna-docusate (SENOKOT-S) 8.6-50 MG tablet Take 1 tablet by mouth 2 (two) times daily. 03/17/18   Eugenie Filler, MD  simvastatin (ZOCOR) 40 MG tablet Take 1 tablet (40 mg total) by mouth every morning. 08/05/16   Golden Circle, FNP  sucralfate (CARAFATE) 1 g tablet Take 1 tablet by mouth 4 (four) times daily.    [provider]  thiamine 100 MG tablet Take 1 tablet (100 mg total) by mouth daily. 04/18/16   Ghimire, Henreitta Leber, MD  traZODone (DESYREL) 50 MG tablet Take 1 tablet (50 mg total) by mouth  at bedtime. 03/17/18   Eugenie Filler, MD    Physical Exam: Vitals:   06/29/22 0232 06/29/22 0300 06/29/22 0400 06/29/22 0430  BP: 123/62 121/62 124/66 111/65  Pulse: 80 80 84 83  Resp: '18 18 19 14  '$ Temp: 97.7 F (36.5 C)     TempSrc: Oral     SpO2: 99% 96% 93% 95%  Weight:      Height:        Physical Exam Vitals reviewed.  Constitutional:      General: She is not in acute distress. HENT:     Head: Normocephalic and atraumatic.  Eyes:     Extraocular Movements: Extraocular movements intact.  Cardiovascular:     Rate and Rhythm: Normal rate and regular rhythm.     Pulses: Normal pulses.  Pulmonary:     Effort: Pulmonary effort is normal. No respiratory distress.     Breath sounds: Normal breath sounds.  Abdominal:     General: Bowel sounds are normal. There is no distension.     Palpations: Abdomen is soft.     Tenderness: There is no abdominal tenderness.  Musculoskeletal:        General: No swelling or tenderness.     Cervical back: Normal range of motion.  Skin:    General: Skin is warm and dry.  Neurological:     Mental Status: She is alert.     Comments: Somnolent but arousable Oriented to person and place Moving all extremities on command, no focal weakness      Labs on Admission: I have personally reviewed  following labs and imaging studies  CBC: Recent Labs  Lab 06/29/22 0049  WBC 15.1*  NEUTROABS 12.0*  HGB 10.0*  HCT 29.6*  MCV 73.8*  PLT 937*   Basic Metabolic Panel: Recent Labs  Lab 06/29/22 0032  NA 131*  K 2.2*  CL 86*  CO2 29  GLUCOSE 95  BUN 21  CREATININE 1.20*  CALCIUM 8.5*  MG 2.0   GFR: Estimated Creatinine Clearance: 38.9 mL/min (A) (by C-G formula based on SCr of 1.2 mg/dL (H)). Liver Function Tests: Recent Labs  Lab 06/29/22 0032  AST 186*  ALT 74*  ALKPHOS 512*  BILITOT 8.2*  PROT 7.1  ALBUMIN 2.9*   Recent Labs  Lab 06/29/22 0032  LIPASE 49   Recent Labs  Lab 06/29/22 0133  AMMONIA 59*   Coagulation Profile: Recent Labs  Lab 06/29/22 0133  INR 1.4*   Cardiac Enzymes: Recent Labs  Lab 06/29/22 0032  CKTOTAL 1,903*   BNP (last 3 results) No results for input(s): "PROBNP" in the last 8760 hours. HbA1C: No results for input(s): "HGBA1C" in the last 72 hours. CBG: Recent Labs  Lab 06/29/22 0031  GLUCAP 102*   Lipid Profile: No results for input(s): "CHOL", "HDL", "LDLCALC", "TRIG", "CHOLHDL", "LDLDIRECT" in the last 72 hours. Thyroid Function Tests: No results for input(s): "TSH", "T4TOTAL", "FREET4", "T3FREE", "THYROIDAB" in the last 72 hours. Anemia Panel: No results for input(s): "VITAMINB12", "FOLATE", "FERRITIN", "TIBC", "IRON", "RETICCTPCT" in the last 72 hours. Urine analysis:    Component Value Date/Time   COLORURINE YELLOW 01/30/2019 2342   APPEARANCEUR CLEAR 01/30/2019 2342   LABSPEC 1.012 01/30/2019 2342   PHURINE 7.0 01/30/2019 2342   GLUCOSEU NEGATIVE 01/30/2019 2342   HGBUR NEGATIVE 01/30/2019 2342   HGBUR negative 07/07/2007 [place] 01/30/2019 [place] 01/30/2019 2342   PROTEINUR NEGATIVE 01/30/2019 2342   UROBILINOGEN 1.0 04/22/2015  0020   NITRITE NEGATIVE 01/30/2019 2342   LEUKOCYTESUR TRACE (A) 01/30/2019 2342    Radiological Exams on Admission: I have  personally reviewed images CT Abdomen Pelvis W Contrast  Result Date: 06/29/2022 CLINICAL DATA:  Found on floor beside bed, left hip pain. EXAM: CT ABDOMEN AND PELVIS WITH CONTRAST TECHNIQUE: Multidetector CT imaging of the abdomen and pelvis was performed using the standard protocol following bolus administration of intravenous contrast. RADIATION DOSE REDUCTION: This exam was performed according to the departmental dose-optimization program which includes automated exposure control, adjustment of the mA and/or kV according to patient size and/or use of iterative reconstruction technique. CONTRAST:  47m OMNIPAQUE IOHEXOL 300 MG/ML  SOLN COMPARISON:  10/06/2019. FINDINGS: Lower chest: Atelectasis is present at the lung bases. Hepatobiliary: There is mild nodularity of the liver. No focal abnormality is identified. Intrahepatic and extrahepatic biliary ductal dilatation is noted. The common bile duct measures up to 1.6 cm in diameter. The gallbladder is surgically absent. Pancreas: Hypodense masslike region is present in the pancreatic head measuring 6.5 x 4.8 x 6.5 cm. There is dilatation of the pancreatic duct measuring up to 6 mm. Peripancreatic fat stranding is noted at the head. Spleen: Normal in size without focal abnormality. Adrenals/Urinary Tract: No adrenal nodule or mass. The kidneys enhance symmetrically. A punctate calculus is noted in the upper pole of the right kidney. There is mild-to-moderate hydroureteronephrosis on the right with urothelial enhancement with a 3 mm stone in the anticipated region of the distal right ureter, axial image 67 possible ureteral calculus or phlebolith. The bladder is within normal limits. Stomach/Bowel: Stomach is within normal limits. There is bowel wall thickening involving the first second and third portions of the duodenum. No bowel obstruction, free air, or pneumatosis. The appendix is not seen. Vascular/Lymphatic: The portal vein and splenic vein are within  normal limits. Nonocclusive thrombus is present in the superior mesenteric vein at the level of the pancreatic lesion. Aortic atherosclerosis without evidence of aneurysm. No abdominal or pelvic lymphadenopathy. Reproductive: Status post hysterectomy. No adnexal masses. Other: Trace amount of perihepatic fluid is noted. 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 Fixation hardware is noted in the proximal left femur. Degenerative changes are present in the thoracolumbar spine. Stable compression deformities are present in the superior endplates at L1 and L2. Severe degenerative changes are noted at L4-L5 and L5-S1, unchanged from 2020. No acute osseous abnormality. IMPRESSION: 1. Hypodense masslike region in the pancreatic head with surrounding fat stranding measuring 6.5 x 4.8 x 6.5 cm resulting in biliary and pancreatic duct ductal dilatation. Findings are concerning for pancreatic adenocarcinoma versus pancreatitis with developing abscess. Correlation with amylase and lipase and CA 19-9 is recommended. MRI with contrast is suggested for further evaluation. 2. 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 3. Morphologic changes of cirrhosis in the liver. 4. Mild-to-moderate hydroureteronephrosis on the right with ureteral enhancement. A 3 mm calculus is present in the anticipated region of the distal right ureter, possible ureteral calculus or phlebolith. Clinical correlation is recommended to exclude superimposed infection. 5. Aortic atherosclerosis. Electronically Signed   By: LBrett FairyM.D.   On: 06/29/2022 04:15   CT Cervical Spine Wo Contrast  Result Date: 06/29/2022 CLINICAL DATA:  Trauma. EXAM: CT HEAD WITHOUT CONTRAST CT CERVICAL SPINE WITHOUT CONTRAST TECHNIQUE: Multidetector CT imaging of the head and cervical spine was performed following the standard protocol without intravenous contrast. Multiplanar CT image  reconstructions of the cervical spine were also generated. RADIATION DOSE REDUCTION: This exam was performed according to the departmental dose-optimization program which includes automated exposure control, adjustment of the mA and/or kV according to patient size and/or use of iterative reconstruction technique. COMPARISON:  Head CT dated 10/31/2018. FINDINGS: CT HEAD FINDINGS Brain: Mild age-related atrophy and chronic microvascular ischemic changes. There is no acute intracranial hemorrhage. No mass effect or midline shift. No extra-axial fluid collection. Vascular: No hyperdense vessel or unexpected calcification. Skull: Normal. Negative for fracture or focal lesion. Sinuses/Orbits: No acute finding. Other: None CT CERVICAL SPINE FINDINGS Alignment: No acute subluxation. Skull base and vertebrae: No acute fracture. Soft tissues and spinal canal: No prevertebral fluid or swelling. No visible canal hematoma. Disc levels:  Multilevel degenerative changes. Upper chest: Negative. Other: None IMPRESSION: 1. No acute intracranial pathology. Mild age-related atrophy and chronic microvascular ischemic changes. 2. No acute cervical spine pathology. Multilevel degenerative changes. Electronically Signed   By: Anner Crete M.D.   On: 06/29/2022 02:57   CT Head Wo Contrast  Result Date: 06/29/2022 CLINICAL DATA:  Trauma. EXAM: CT HEAD WITHOUT CONTRAST CT CERVICAL SPINE WITHOUT CONTRAST TECHNIQUE: Multidetector CT imaging of the head and cervical spine was performed following the standard protocol without intravenous contrast. Multiplanar CT image reconstructions of the cervical spine were also generated. RADIATION DOSE REDUCTION: This exam was performed according to the departmental dose-optimization program which includes automated exposure control, adjustment of the mA and/or kV according to patient size and/or use of iterative reconstruction technique. COMPARISON:  Head CT dated 10/31/2018. FINDINGS: CT HEAD  FINDINGS Brain: Mild age-related atrophy and chronic microvascular ischemic changes. There is no acute intracranial hemorrhage. No mass effect or midline shift. No extra-axial fluid collection. Vascular: No hyperdense vessel or unexpected calcification. Skull: Normal. Negative for fracture or focal lesion. Sinuses/Orbits: No acute finding. Other: None CT CERVICAL SPINE FINDINGS Alignment: No acute subluxation. Skull base and vertebrae: No acute fracture. Soft tissues and spinal canal: No prevertebral fluid or swelling. No visible canal hematoma. Disc levels:  Multilevel degenerative changes. Upper chest: Negative. Other: None IMPRESSION: 1. No acute intracranial pathology. Mild age-related atrophy and chronic microvascular ischemic changes. 2. No acute cervical spine pathology. Multilevel degenerative changes. Electronically Signed   By: Anner Crete M.D.   On: 06/29/2022 02:57   DG Chest Port 1 View  Result Date: 06/29/2022 CLINICAL DATA:  Fall. EXAM: PORTABLE CHEST 1 VIEW COMPARISON:  03/19/2019. FINDINGS: Heart is enlarged and the mediastinal contour is within normal limits. Atherosclerotic calcification of the aorta is noted. No consolidation, effusion, or pneumothorax. No acute osseous abnormality. IMPRESSION: No active disease. Electronically Signed   By: Brett Fairy M.D.   On: 06/29/2022 00:29   DG Knee Complete 4 Views Right  Result Date: 06/29/2022 CLINICAL DATA:  Fall. EXAM: RIGHT KNEE - COMPLETE 4+ VIEW COMPARISON:  02/07/2013. FINDINGS: No evidence of fracture, dislocation, or joint effusion. Mild tricompartmental degenerative changes are noted. Soft tissue swelling is present anterior to the knee. IMPRESSION: 1. No acute fracture or dislocation. 2. Mild tricompartmental degenerative changes. Electronically Signed   By: Brett Fairy M.D.   On: 06/29/2022 00:28   DG HIPS BILAT WITH PELVIS 3-4 VIEWS  Result Date: 06/29/2022 CLINICAL DATA:  Fall. EXAM: DG HIP (WITH OR WITHOUT PELVIS) 3-4V  BILAT COMPARISON:  10/27/2016, 12/19/2020, 10/06/2019. FINDINGS: There is no evidence of acute hip fracture or dislocation. There is bony deformity of the inferior pubic ramus on the left which is new  from the previous exam. Fixation hardware is present in the left femur with bony deformity of the mid femoral shaft compatible with old healed fracture. Joint space is maintained at the hips bilaterally. Degenerative changes are present in the lower lumbar spine. IMPRESSION: 1. Bony deformity of the inferior pubic ramus on the left which is new from 2020, suggesting old healed fracture. Clinical correlation is recommended to exclude acute fracture. 2. Old healed fracture of the distal left femur with hardware in place. Electronically Signed   By: Brett Fairy M.D.   On: 06/29/2022 00:25    EKG: Independently reviewed.  Sinus rhythm, QTc 576.  Assessment and Plan  Acute encephalopathy Likely due to polypharmacy -Home medications include oxycodone, Flexeril, trazodone, gabapentin, and Ativan.  Hepatic encephalopathy also likely contributing as ammonia is slightly elevated.  Blood gas without evidence of hypercapnia.  Family concerned about accidental oxycodone overdose.  However pill count normal per EMS and patient denies taking any extra pills.  Currently somnolent but able to wake up briefly and answer a few questions.  Moving all extremities on command.  CT head negative. -Avoid opiates/benzodiazepines or any other sedating medications -Give a dose of lactulose  Hypokalemia with QT prolongation Magnesium is normal. -Cardiac monitoring -Continue to replace potassium and monitor closely -Avoid QT prolonging drugs -Repeat EKG in a.m.  Mild rhabdomyolysis AKI -Continue IV fluid hydration and trend CK -Avoid nephrotoxic agents  Pancreatic head mass on CT Elevated liver enzymes CT showing a hypodense masslike region in the pancreatic head with surrounding fat stranding measuring 6.5 x 4.8 x 6.5 cm  resulting in biliary and pancreatic duct ductal dilatation. Findings are concerning for pancreatic adenocarcinoma versus pancreatitis with developing abscess. Acute pancreatitis less likely as lipase is normal.  -MRI ordered for further evaluation -Check CA 19-9 -Repeat LFTs -Oncology and GI will have to be involved during daytime.  Nonocclusive SMV thrombus CT showing a nonocclusive thrombus in the superior mesenteric vein at the level of the pancreatic lesion.  Bowel ischemia less likely as lactate is normal. -Checking FOBT given drop in hemoglobin, if negative, start heparin for anticoagulation.  Microcytic anemia Hemoglobin currently 10.0, was previously normal on labs done 3 years ago. -FOBT and anemia panel  Mild hyponatremia -IV fluid hydration, continue to monitor  Obstructive uropathy/possible infected kidney stone CT showing mild-to-moderate hydroureteronephrosis on the right with ureteral enhancement. A 3 mm calculus is present in the anticipated region of the distal right ureter, possible ureteral calculus or phlebolith.  UA with positive nitrite.  WBC 15.9 without signs of sepsis. -Ceftriaxone -Urine and blood cultures pending -Consult urology in a.m.  Alcohol abuse -CIWA monitoring -Thiamine, folate, multivitamin  Hyperlipidemia -Hold statin at this time  Depression Asthma: Stable, no signs of acute exacerbation. GERD Psoriasis -Pharmacy med rec pending.  DVT prophylaxis: SCDs Code Status: Full Code Family Communication: No family available at this time. Level of care: Telemetry bed Admission status: It is my clinical opinion that referral for OBSERVATION is reasonable and necessary in this patient based on the above information provided. The aforementioned taken together are felt to place the patient at high risk for further clinical deterioration. However, it is anticipated that the patient may be medically stable for discharge from the hospital within 24 to 48  hours.   Shela Leff MD [facility]  If 7PM-7AM, please contact night-coverage [email or web address]  06/29/2022, 5:13 AM

## 2022-06-29 NOTE — Progress Notes (Addendum)
Pharmacy Antibiotic Note  [name] is a 76 y.o. female admitted on 06/28/2022 with altered mental status. 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 CXR negative. Pharmacy has been consulted for vancomycin and cefepime dosing.  WBC 15, LA 1.1 >> 2.5, Tm 98  Plan: Vancomycin '1250mg'$  IV x1, followed by Vancomycin '1000mg'$  IV q24 hours (eAUC 517, Scr 1.05, TBW) Cefepime 2g IV q12 hours (CrCl 30-60 mL/min) Flagyl '500mg'$  IV q12 hours  F/u culture data, clinical improvement, ability to narrow antibiotics  Height: '5\' 8"'$  (172.7 cm) Weight: 62.1 kg (136 lb 14.5 oz) IBW/kg (Calculated) : 63.9  Temp (24hrs), Avg:97.9 F (36.6 C), Min:97.7 F (36.5 C), Max:98.2 F (36.8 C)  Recent Labs  Lab 06/29/22 0032 06/29/22 0049 06/29/22 0253 06/29/22 0705 06/29/22 1502  WBC  --  15.1*  --  15.0*  --   CREATININE 1.20*  --   --  1.05*  --   LATICACIDVEN  --   --  1.1  --  2.5*    Estimated Creatinine Clearance: 46.8 mL/min (A) (by C-G formula based on SCr of 1.05 mg/dL (H)).    Allergies  Allergen Reactions   Azithromycin Itching and Swelling   Morphine And Related Itching    Antimicrobials this admission: Vancomycin 8/19 >>  Cefepime 8/19 >>  Flagyl 8/19 >>   Dose adjustments this admission: AKI on admission (Scr up to 1.2) - switch zosyn to cefepime + flagyl in order to limit further risk of AKI.  Microbiology results: 8/19 BCx: sent 8/19 Ucx: sent  Thank you for allowing pharmacy to be a part of this patient's care.  Dimple Nanas, PharmD, BCPS 06/29/2022 3:43 PM

## 2022-06-29 NOTE — Progress Notes (Addendum)
Subjective: Patient admitted this morning, see detailed H&P by Dr Marlowe Sax  77 y.o. female with medical history significant of alcohol abuse, chronic pain with history of oxycodone overdose in the past, depression, asthma, GERD, hyperlipidemia, psoriasis, left renal cell carcinoma status post partial nephrectomy in 2012, history of cholecystectomy presents to the ED from home after daughter suspected accidental oxycodone overdose  Blood cultures drawn.  UA and urine culture pending.  Chest x-ray showing no active disease.  X-ray of right knee negative for fracture or dislocation.  X-ray of hips showing bony deformity of the inferior pubic ramus on the left which is new from 2020, suggesting old healed fracture  CT abdomen/pelvis showedHypodense masslike region in the pancreatic head with surrounding fat stranding measuring 6.5 x 4.8 x 6.5 cm resulting in biliary and pancreatic duct ductal dilatation. Findings are concerning for pancreatic adenocarcinoma versus pancreatitis with developing abscess. Correlation with amylase and lipase and CA 19-9 is recommended. MRI with contrast is suggested for further evaluation. 2. 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 3. Morphologic changes of cirrhosis in the liver. 4. Mild-to-moderate hydroureteronephrosis on the right with ureteral enhancement. A 3 mm calculus is present in the anticipated region of the distal right ureter, possible ureteral calculus or phlebolith. Clinical correlation is recommended to exclude superimposed infection.   Vitals:   06/29/22 1230 06/29/22 1442  BP: 122/70 (!) 96/58  Pulse: 79 78  Resp: 14 18  Temp:  98.2 F (36.8 C)  SpO2: 99% 100%      A/P Acute metabolic encephalopathy -Likely in setting of polypharmacy; patient was on oxycodone, Flexeril, trazodone, Ativan, gabapentin -Patient was somnolent however able to answer my questions. -Avoid sedating  medications   Pancreatic mass seen on CT abdomen/pelvis -CT showing a hypodense masslike region in the pancreatic head with surrounding fat stranding measuring 6.5 x 4.8 x 6.5 cm resulting in biliary and pancreatic duct ductal dilatation. Findings are concerning for pancreatic adenocarcinoma versus pancreatitis with developing abscess -MRCP abdomen obtained confirms pancreatic mass -Gastroenterology consulted, likely patient will need EUS and ERCP -Also discussed with oncology, Dr. Irene Limbo; oncology will follow patient on Monday  Nonocclusive SMV thrombus -Discussed with oncology, will hold anticoagulation at this time; till patient gets endoscopic ultrasound/ERCP and biopsy -Will eventually need to be started on anticoagulation -Stool for occult blood is pending  Obstructive uropathy/possible infected kidney stone -CT showed mild to moderate hydroureteronephrosis on the right with ureteral enhancement -A 3 mm calculus present within the anticipated region of the distal right ureter, possible ureteral calculus or phlebolith -UA with positive nitrite -Started on ceftriaxone, will change antibiotics to vancomycin, cefepime and Flagyl -Urology consulted  Alcohol abuse -CIWA protocol  Hypokalemia -Replace potassium and follow BMP in am   [place]

## 2022-06-29 NOTE — ED Notes (Signed)
MD paged regarding new potassium orders. Pt on 4th bag of potassium ordered by EDP. Admitting MD states to hold potassium at this time.

## 2022-06-29 NOTE — TOC Initial Note (Signed)
Transition of Care [facility]) - Initial/Assessment Note    Patient Details  Name: [name] MRN: [id number] Date of Birth: Nov 20, 1945  Transition of [facility]) CM/SW Contact:    Gilmar Bua, Marta Lamas, LCSW Phone Number: 06/29/2022, 11:10 AM  Clinical Narrative:                  CSW made several attempts to try and meet with patient at bedside, to introduce self, explain role, and types of services provided.  However, patient was resting comfortably and CSW was unable to arouse.  CSW collaboration with patient's daughter's Angola Hope (# [phone]) and Amada Kingfisher [phone]), to discuss patient's discharge disposition.  In conversing with Ms. Elnoria Howard, at patient's bedside, she would like for patient to receive counseling and supportive services, as well as education, resources and referrals for alcohol and substance abuse treatment services; however, patient is not receptive at this time.  Ms. Elnoria Howard agreed to converse with patient independently, then return call to [place].  Contact information for CSW provided.  CSW will continue to follow for transition of care needs.  Expected Discharge Plan: Home/Self Care Barriers to Discharge: Active Substance Use - Placement   Patient Goals and CMS Choice Patient states their goals for this hospitalization and ongoing recovery are:: Return Home CMS Medicare.gov Compare Post Acute Care list provided to:: Patient Choice offered to / list presented to : Patient  Expected Discharge Plan and Services Expected Discharge Plan: Home/Self Care In-house Referral: Clinical Social Work Discharge Planning Services: Other - See comment (Alcohol & Substance Abuse Counseling/Resources/Referrals)   Living arrangements for the past 2 months: Single Family Home         Representative spoke with at DME Agency: N/A    Representative spoke with at [place]: N/A  Prior Living Arrangements/Services Living arrangements for the past 2 months: [place] with:: Adult Children Patient language and need for interpreter reviewed:: Yes Do you feel safe going back to the place where you live?: Yes      Need for Family Participation in Patient Care: Yes (Comment) Care giver support system in place?: Yes (comment)   Criminal Activity/Legal Involvement Pertinent to Current Situation/Hospitalization: No - Comment as needed  Activities of Daily Living   Independent  Permission Sought/Granted Permission sought to share information with : Case Manager, PCP, Family Supports Permission granted to share information with : Yes, Verbal Permission Granted  Share Information with NAME: Daughter's - Angola Hope & Barbara Hart  Permission granted to share info w AGENCY: N/A  Permission granted to share info w Relationship: N/A  Permission granted to share info w Contact Information: N/A  Emotional Assessment Appearance:: Disheveled Attitude/Demeanor/Rapport: Unable to Assess (Patient sleeping and unable to arouse.) Affect (typically observed): Unable to Assess (Patient sleeping and unable to arouse.) Orientation: :  (Unable to assess.) Alcohol / Substance Use: Alcohol Use Psych Involvement: No (comment)  Admission diagnosis:  Hypokalemia [E87.6] Patient Active Problem List   Diagnosis Date Noted   Acute encephalopathy 06/29/2022   QT prolongation 06/29/2022   Rhabdomyolysis 06/29/2022   Pancreatic mass 06/29/2022   Elevated liver enzymes 06/29/2022   Obstructive uropathy 06/29/2022   Chronic pain syndrome    Hypomagnesemia    Blood pressure elevated without history of HTN    Opiate overdose (HCC)    Hypokalemia 03/14/2018   AKI (acute kidney injury) ([place]) 03/14/2018   Opioid overdose ([place]) 03/14/2018   Opioid abuse ([place]) 03/14/2018   C. 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   Displaced oblique fracture of shaft of left femur ([place]) 04/13/2016   Acute upper GI bleeding 04/05/2016   Upper GI bleed 04/05/2016   Rotator cuff tendinitis 03/11/2016   Alcohol withdrawal ([place]) 01/04/2016   Acute blood loss anemia 01/04/2016   Tobacco abuse 01/03/2016   Diarrhea 01/03/2016   Depression 01/03/2016   Alcohol abuse    Chronic back pain 08/18/2015   Seasonal allergies 07/05/2015   Insomnia 07/05/2015   Arthritis 07/05/2015   Psoriasis 07/05/2015   Menopausal hot flushes 06/01/2015   Dyspareunia 06/01/2015   Multiple falls 05/03/2015   Encounter for medication review 05/03/2015   Hot flashes 05/03/2015   Herniated lumbar intervertebral disc 10/12/2014   Generalized abdominal pain 09/25/2013   Anemia 09/11/2013   Asthma 04/24/2007   Hyperlipidemia 01/04/2007   PCP:  Lois Huxley, PA Pharmacy:   [id number] [address] [place]Phone: [phone]: [phone] [id number] [address]: [phone]: [phone] [address] [address]: [phone]: [id number] [place] [place]one: [phone]: [phone] [facility]ail [facility] - [address] [place]one: [phone]: [phone] Determinants of Health (SDOH) Interventions  Alcohol and Substance Abuse Counseling Services, Education, Referrals and Resources.  Readmission Risk Interventions     No data to display          JoaNat ChristenSW, MSW, LCSLillieicensed Clinical Social Worker  [place] [address]re[address] [place] LakeC [id number] Address-[address] HillsC [address] # [phone] # [phone] # [email or web address]

## 2022-06-29 NOTE — Consult Note (Signed)
Reason for Consult: Abnormal CT abnormal liver tests Referring Physician: Hospital team  [name] is an 76 y.o. female.  HPI: Patient seen and examined and discussed with hospital team and she has been seen in the hospital 5 years ago by our service but she has no office computer chart and is not listed in our office computer and is considered unassigned and on Monday I will turn her case over to Dr. Benson Norway and she presented to the emergency room with altered mental status which she has done before due to probable accidental overdose and does drink alcohol frequently and does have a history of varices on previous endoscopies has not had any bleeding does have lots of pain issues and is on chronic narcotics and does complain of chronic abdominal pain but has not been admitted to other hospitals has no other complaints and she denies any aspirin or nonsteroidals or blood thinners at home  Past Medical History:  Diagnosis Date   Alcohol abuse    Alcohol abuse    Allergy    Arthritis    back-severe, hips, right knee   Asthma    GERD (gastroesophageal reflux disease)    occasional   H/O measles    H/O mumps    Hypercholesteremia    under control   Insomnia    Psoriasis (a type of skin inflammation)    Renal cell carcinoma 2012   left   Seasonal allergies     Past Surgical History:  Procedure Laterality Date   ABDOMINAL HYSTERECTOMY  40years ago   BUNIONECTOMY  04/2011   CHOLECYSTECTOMY  11/13/2011   Procedure: LAPAROSCOPIC CHOLECYSTECTOMY WITH INTRAOPERATIVE CHOLANGIOGRAM;  Surgeon: Judieth Keens, DO;  Location: [facility];  Service: General;  Laterality: N/A;   COLONOSCOPY N/A 09/25/2013   Procedure: COLONOSCOPY;  Surgeon: Lear Ng, MD;  [facility];  Service: Endoscopy;  Laterality: N/A;   ESOPHAGOGASTRODUODENOSCOPY N/A 09/25/2013   Procedure: ESOPHAGOGASTRODUODENOSCOPY (EGD);  Surgeon: Lear Ng, MD;  [facility];  Service: Endoscopy;   Laterality: N/A;   ESOPHAGOGASTRODUODENOSCOPY N/A 04/07/2016   Procedure: ESOPHAGOGASTRODUODENOSCOPY (EGD);  Surgeon: Milus Banister, MD;  Location: [facility];  Service: Endoscopy;  Laterality: N/A;   ESOPHAGOGASTRODUODENOSCOPY N/A 10/26/2016   Procedure: ESOPHAGOGASTRODUODENOSCOPY (EGD);  Surgeon: Arta Silence, MD;  Location: [facility];  Service: Endoscopy;  Laterality: N/A;   ESOPHAGOGASTRODUODENOSCOPY N/A 12/07/2016   Procedure: ESOPHAGOGASTRODUODENOSCOPY (EGD);  Surgeon: Wonda Horner, MD;  Location: [facility];  Service: Endoscopy;  Laterality: N/A;   ESOPHAGOGASTRODUODENOSCOPY (EGD) WITH PROPOFOL Left 01/03/2016   Procedure: ESOPHAGOGASTRODUODENOSCOPY (EGD) WITH PROPOFOL;  Surgeon: Arta Silence, MD;  [facility];  Service: Endoscopy;  Laterality: Left;   FEMUR IM NAIL Left 04/15/2016   Procedure: INTRAMEDULLARY (IM) RETROGRADE FEMORAL NAILING;  Surgeon: Gaynelle Arabian, MD;  Location[facility];  Service: Orthopedics;  Laterality: Left;   HERNIA REPAIR  04/2375   supraumbilical repair   KIDNEY SURGERY  12/2010   [facility]; partial nephrectomy   LUMBAR LAMINECTOMY/DECOMPRESSION MICRODISCECTOMY Right 10/12/2014   Procedure: HEMI LAMINECTOMY MICRODISCECTOMY L5-S1 RIGHT (1 LEVEL);  Surgeon: Tobi Bastos, MD;  [facility];  Service: Orthopedics;  Laterality: Right;   MENISECTOMY  2010   left knee   TUBAL LIGATION  6 years ago    Family History  Problem Relation Age of Onset   Hyperlipidemia Mother    Hypertension Mother     Social History:  reports that she has been smoking cigarettes. She has a 0.90 pack-year  smoking history. She has never used smokeless tobacco. She reports current alcohol use of about 2.0 standard drinks of alcohol per week. She reports that she does not use drugs.  Allergies:  Allergies  Allergen Reactions   Azithromycin Itching and Swelling   Morphine And Related Itching    Medications: I have reviewed the patient's current  medications.  Results for orders placed or performed during the hospital encounter of 06/28/22 (from the past 48 hour(s))  CBG monitoring, ED     Status: Abnormal   Collection Time: 06/29/22 12:31 AM  Result Value Ref Range   Glucose-Capillary 102 (H) 70 - 99 mg/dL    Comment: Glucose reference range applies only to samples taken after fasting for at least 8 hours.  Comprehensive metabolic panel     Status: Abnormal   Collection Time: 06/29/22 12:32 AM  Result Value Ref Range   Sodium 131 (L) 135 - 145 mmol/L   Potassium 2.2 (LL) 3.5 - 5.1 mmol/L    Comment: CRITICAL RESULT CALLED TO, READ BACK BY AND VERIFIED WITH KELSEY, A @ [id number]    Chloride 86 (L) 98 - 111 mmol/L   CO2 29 22 - 32 mmol/L   Glucose, Bld 95 70 - 99 mg/dL    Comment: Glucose reference range applies only to samples taken after fasting for at least 8 hours.   BUN 21 8 - 23 mg/dL   Creatinine, Ser 1.20 (H) 0.44 - 1.00 mg/dL   Calcium 8.5 (L) 8.9 - 10.3 mg/dL   Total Protein 7.1 6.5 - 8.1 g/dL   Albumin 2.9 (L) 3.5 - 5.0 g/dL   AST 186 (H) 15 - 41 U/L   ALT 74 (H) 0 - 44 U/L   Alkaline Phosphatase 512 (H) 38 - 126 U/L   Total Bilirubin 8.2 (H) 0.3 - 1.2 mg/dL   GFR, Estimated 48 (L) >60 mL/min    Comment: (NOTE) Calculated using the CKD-EPI Creatinine Equation (2021)    Anion gap 16 (H) 5 - 15    Comment: Performed at [facility], [place] [address] [place], [place] [address]  Magnesium     Status: None   Collection Time: 06/29/22 12:32 AM  Result Value Ref Range   Magnesium 2.0 1.7 - 2.4 mg/dL    Comment: Performed at [facility], [place] [address], [place], [place] [address]  Lipase, blood     Status: None   Collection Time: 06/29/22 12:32 AM  Result Value Ref Range   Lipase 49 11 - 51 U/L    Comment: Performed at [facility], [place] [address] [place], [place] [address]  CK     Status: Abnormal   Collection Time: 06/29/22 12:32 AM  Result  Value Ref Range   Total CK 1,903 (H) 38 - 234 U/L    Comment: Performed at [facility], [place] [address] [place], [place] [address]  Ethanol     Status: None   Collection Time: 06/29/22 12:49 AM  Result Value Ref Range   Alcohol, Ethyl (B) <10 <10 mg/dL    Comment: (NOTE) Lowest detectable limit for serum alcohol is 10 mg/dL.  For medical purposes only. Performed at [facility], [place] [address] [place], [place] [address]   Acetaminophen level     Status: Abnormal   Collection Time: 06/29/22 12:49 AM  Result Value Ref Range   Acetaminophen (Tylenol), Serum <10 (L) 10 - 30 ug/mL  Comment: (NOTE) Therapeutic concentrations vary significantly. A range of 10-30 ug/mL  may be an effective concentration for many patients. However, some  are best treated at concentrations outside of this range. Acetaminophen concentrations >150 ug/mL at 4 hours after ingestion  and >50 ug/mL at 12 hours after ingestion are often associated with  toxic reactions.  Performed at [facility], [place] [address], [place], [place] [address]   Salicylate level     Status: Abnormal   Collection Time: 06/29/22 12:49 AM  Result Value Ref Range   Salicylate Lvl <0.3 (L) 7.0 - 30.0 mg/dL    Comment: Performed at [facility], [place] [address] [place], [place] [address]  CBC with Differential/Platelet     Status: Abnormal   Collection Time: 06/29/22 12:49 AM  Result Value Ref Range   WBC 15.1 (H) 4.0 - 10.5 K/uL   RBC 4.01 3.87 - 5.11 MIL/uL   Hemoglobin 10.0 (L) 12.0 - 15.0 g/dL   HCT 29.6 (L) 36.0 - 46.0 %   MCV 73.8 (L) 80.0 - 100.0 fL   MCH 24.9 (L) 26.0 - 34.0 pg   MCHC 33.8 30.0 - 36.0 g/dL   RDW 20.6 (H) 11.5 - 15.5 %   Platelets 426 (H) 150 - 400 K/uL   nRBC 0.0 0.0 - 0.2 %   Neutrophils Relative % 81 %   Neutro Abs 12.0 (H) 1.7 - 7.7 K/uL   Lymphocytes Relative 9 %   Lymphs Abs 1.3 0.7 - 4.0 K/uL   Monocytes Relative 10  %   Monocytes Absolute 1.6 (H) 0.1 - 1.0 K/uL   Eosinophils Relative 0 %   Eosinophils Absolute 0.0 0.0 - 0.5 K/uL   Basophils Relative 0 %   Basophils Absolute 0.0 0.0 - 0.1 K/uL   Immature Granulocytes 0 %   Abs Immature Granulocytes 0.06 0.00 - 0.07 K/uL    Comment: Performed at [facility], [place] [address], [place], [place] [address]  Blood gas, venous     Status: Abnormal   Collection Time: 06/29/22  1:11 AM  Result Value Ref Range   pH, Ven 7.49 (H) 7.25 - 7.43   pCO2, Ven 53 44 - 60 mmHg   pO2, Ven 45 32 - 45 mmHg   Bicarbonate 40.4 (H) 20.0 - 28.0 mmol/L   Acid-Base Excess 14.6 (H) 0.0 - 2.0 mmol/L   O2 Saturation 73.6 %   Patient temperature 37.0     Comment: Performed at [facility], [place] [address], [place], [place] [address]  Protime-INR     Status: Abnormal   Collection Time: 06/29/22  1:33 AM  Result Value Ref Range   Prothrombin Time 16.7 (H) 11.4 - 15.2 seconds   INR 1.4 (H) 0.8 - 1.2    Comment: (NOTE) INR goal varies based on device and disease states. Performed at [facility], [place] [address], [place],  [address]   Ammonia     Status: Abnormal   Collection Time: 06/29/22  1:33 AM  Result Value Ref Range   Ammonia 59 (H) 9 - 35 umol/L    Comment: Performed at [facility], [place] [address] [place], [place] [address]  Lactic acid, plasma     Status: None   Collection Time: 06/29/22  2:53 AM  Result Value Ref Range   Lactic Acid, Venous 1.1 0.5 - 1.9 mmol/L    Comment: Performed at [facility], [place] [address]  [place], [place] [address]  Urinalysis, Routine w reflex microscopic     Status: Abnormal   Collection Time: 06/29/22  5:14 AM  Result Value Ref Range   Color, Urine AMBER (A) YELLOW    Comment: BIOCHEMICALS MAY BE AFFECTED BY COLOR   APPearance CLEAR CLEAR   Specific Gravity, Urine 1.011 1.005 - 1.030   pH 6.0 5.0 - 8.0   Glucose, UA NEGATIVE  NEGATIVE mg/dL   Hgb urine dipstick LARGE (A) NEGATIVE   Bilirubin Urine NEGATIVE NEGATIVE   Ketones, ur NEGATIVE NEGATIVE mg/dL   Protein, ur NEGATIVE NEGATIVE mg/dL   Nitrite POSITIVE (A) NEGATIVE   Leukocytes,Ua NEGATIVE NEGATIVE   RBC / HPF 6-10 0 - 5 RBC/hpf   WBC, UA 0-5 0 - 5 WBC/hpf   Bacteria, UA FEW (A) NONE SEEN   Squamous Epithelial / LPF 0-5 0 - 5    Comment: Performed at [facility], [place] [address] [place], [place] [address]  Vitamin B12     Status: Abnormal   Collection Time: 06/29/22  7:04 AM  Result Value Ref Range   Vitamin B-12 7,454 (H) 180 - 914 pg/mL    Comment: RESULTS CONFIRMED BY MANUAL DILUTION Performed at [facility], [place] [address], [place], [place] [address]   Folate     Status: None   Collection Time: 06/29/22  7:04 AM  Result Value Ref Range   Folate >40.0 >5.9 ng/mL    Comment: RESULTS CONFIRMED BY MANUAL DILUTION Performed at [place] [address] [place], [place] [address]   Iron and TIBC     Status: Abnormal   Collection Time: 06/29/22  7:04 AM  Result Value Ref Range   Iron 23 (L) 28 - 170 ug/dL   TIBC 343 250 - 450 ug/dL   Saturation Ratios 7 (L) 10.4 - 31.8 %   UIBC 320 ug/dL    Comment: Performed at [facility], [place] [address], [place], [place] [address]  Ferritin     Status: Abnormal   Collection Time: 06/29/22  7:04 AM  Result Value Ref Range   Ferritin 9 (L) 11 - 307 ng/mL    Comment: Performed at [facility], [place] [address] [place], [place] [address]  Reticulocytes     Status: Abnormal   Collection Time: 06/29/22  7:04 AM  Result Value Ref Range   Retic Ct Pct 2.3 0.4 - 3.1 %   RBC. 3.68 (L) 3.87 - 5.11 MIL/uL   Retic Count, Absolute 85.4 19.0 - 186.0 K/uL   Immature Retic Fract 22.5 (H) 2.3 - 15.9 %    Comment: Performed at [facility], [place] [address] [place], [place] [address]  CK     Status:  Abnormal   Collection Time: 06/29/22  7:05 AM  Result Value Ref Range   Total CK 3,101 (H) 38 - 234 U/L    Comment: Performed at [facility], [place] [address] [place],  [address]  CBC     Status: Abnormal   Collection Time: 06/29/22  7:05 AM  Result Value Ref Range   WBC 15.0 (H) 4.0 - 10.5 K/uL   RBC 3.60 (L) 3.87 - 5.11 MIL/uL   Hemoglobin 8.8 (L) 12.0 - 15.0 g/dL    Comment: Reticulocyte Hemoglobin testing may be clinically indicated, consider ordering this additional test [id number]    HCT 27.0 (L) 36.0 - 46.0 %   MCV 75.0 (L) 80.0 -  100.0 fL   MCH 24.4 (L) 26.0 - 34.0 pg   MCHC 32.6 30.0 - 36.0 g/dL   RDW 20.8 (H) 11.5 - 15.5 %   Platelets 421 (H) 150 - 400 K/uL   nRBC 0.0 0.0 - 0.2 %    Comment: Performed at [facility], [place] [address] [place], [place] [address]  Comprehensive metabolic panel     Status: Abnormal   Collection Time: 06/29/22  7:05 AM  Result Value Ref Range   Sodium 134 (L) 135 - 145 mmol/L   Potassium 2.1 (LL) 3.5 - 5.1 mmol/L    Comment: CRITICAL RESULT CALLED TO, READ BACK BY AND VERIFIED WITH BARBER,M AT 3267 ON 06/29/22 BY LUZOLOP    Chloride 91 (L) 98 - 111 mmol/L   CO2 30 22 - 32 mmol/L   Glucose, Bld 90 70 - 99 mg/dL    Comment: Glucose reference range applies only to samples taken after fasting for at least 8 hours.   BUN 20 8 - 23 mg/dL   Creatinine, Ser 1.05 (H) 0.44 - 1.00 mg/dL   Calcium 8.1 (L) 8.9 - 10.3 mg/dL   Total Protein 6.3 (L) 6.5 - 8.1 g/dL   Albumin 2.7 (L) 3.5 - 5.0 g/dL   AST 212 (H) 15 - 41 U/L   ALT 73 (H) 0 - 44 U/L   Alkaline Phosphatase 481 (H) 38 - 126 U/L   Total Bilirubin 7.6 (H) 0.3 - 1.2 mg/dL   GFR, Estimated 56 (L) >60 mL/min    Comment: (NOTE) Calculated using the CKD-EPI Creatinine Equation (2021)    Anion gap 13 5 - 15    Comment: Performed at [facility], [place] [address] [place], [place] [address]    MR ABDOMEN MRCP W WO CONTAST  Result  Date: 06/29/2022 CLINICAL DATA:  Pancreatic head mass on CT scan earlier same day. EXAM: MRI ABDOMEN WITHOUT AND WITH CONTRAST (INCLUDING MRCP) TECHNIQUE: Multiplanar multisequence MR imaging of the abdomen was performed both before and after the administration of intravenous contrast. Heavily T2-weighted images of the biliary and pancreatic ducts were obtained, and three-dimensional MRCP images were rendered by post processing. CONTRAST:  41m GADAVIST GADOBUTROL 1 MMOL/ML IV SOLN COMPARISON:  CT scan abdomen/pelvis 06/29/2022 FINDINGS: Lower chest: Unremarkable. Hepatobiliary: No suspicious focal abnormality within the liver parenchyma. Gallbladder surgically absent. Mild intrahepatic biliary duct dilatation is associated with 16 mm diameter common duct in the porta hepatis and dilatation of the cystic duct and common bile duct abruptly tapering just proximal to the ampulla. Pancreas: 6.5 x 5.8 x 4.9 cm rim enhancing ill-defined mass is identified in the anterior pancreatic head. Mild diffuse dilatation of the main pancreatic duct although mass lesion is anterior to the common bile duct and main pancreatic duct. Lesion probably abuts the inferior aspect portal vein and coronal imaging shows lateral margin of the lesion indistinguishable from the medial wall of the descending duodenum (see coronal 16/12). 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 Spleen: No splenomegaly. No focal mass lesion. Adrenals/Urinary Tract: No adrenal nodule or mass. Kidneys unremarkable. Stomach/Bowel: Stomach is nondistended. Although assessment is motion degraded, the anterior pancreatic head mass is contiguous with the posterior wall the duodenum and medial wall of the descending duodenum. Coronal T2 imaging is highly suspicious for tumor invasion of the medial  wall of  the descending duodenum (see axial T2 image 30 of series 2 and axial T2 haste image 29 series 16. This region is not well evaluated on postcontrast imaging due to motion artifact but appearance is similar suggesting duodenal invasion. Vascular/Lymphatic: Other:  Trace free fluid noted around the liver. Musculoskeletal: No focal suspicious marrow enhancement within the visualized bony anatomy. Skip that trace free fluid noted around the liver IMPRESSION: 1. 6.5 x 5.8 x 4.9 cm rim enhancing mass in the anterior pancreatic head is contiguous with the posterior wall the duodenum bulb and medial wall of the descending duodenum. Imaging features suggest direct invasion of the medial wall of the descending duodenum. Primary pancreatic adenocarcinoma would be distinct consideration. Primary duodenal neoplasm with secondary involvement of the pancreatic head is considered less likely but not excluded. Lesion probably abuts the inferior aspect of the portal vein. 2. 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 3. Intrahepatic and extrahepatic biliary duct dilatation with abrupt cut off of the common bile duct just proximal to the ampulla. This is associated with mild diffuse dilatation of the main pancreatic duct. 4. Mass lesion compresses the IVC posteriorly against the spine. IVC involvement not excluded. 5. No evidence for hepatic metastases nor metastatic lymphadenopathy in the abdomen. 6. Trace free fluid around the liver. Electronically Signed   By: Misty Stanley M.D.   On: 06/29/2022 12:08   MR 3D Recon At Scanner  Result Date: 06/29/2022 CLINICAL DATA:  Pancreatic head mass on CT scan earlier same day. EXAM: MRI ABDOMEN WITHOUT AND WITH CONTRAST (INCLUDING MRCP) TECHNIQUE: Multiplanar multisequence MR imaging of the abdomen was performed both before and after the administration of intravenous contrast. Heavily T2-weighted images of the biliary and pancreatic  ducts were obtained, and three-dimensional MRCP images were rendered by post processing. CONTRAST:  3m GADAVIST GADOBUTROL 1 MMOL/ML IV SOLN COMPARISON:  CT scan abdomen/pelvis 06/29/2022 FINDINGS: Lower chest: Unremarkable. Hepatobiliary: No suspicious focal abnormality within the liver parenchyma. Gallbladder surgically absent. Mild intrahepatic biliary duct dilatation is associated with 16 mm diameter common duct in the porta hepatis and dilatation of the cystic duct and common bile duct abruptly tapering just proximal to the ampulla. Pancreas: 6.5 x 5.8 x 4.9 cm rim enhancing ill-defined mass is identified in the anterior pancreatic head. Mild diffuse dilatation of the main pancreatic duct although mass lesion is anterior to the common bile duct and main pancreatic duct. Lesion probably abuts the inferior aspect portal vein and coronal imaging shows lateral margin of the lesion indistinguishable from the medial wall of the descending duodenum (see coronal 16/12). 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 Spleen: No splenomegaly. No focal mass lesion. Adrenals/Urinary Tract: No adrenal nodule or mass. Kidneys unremarkable. Stomach/Bowel: Stomach is nondistended. Although assessment is motion degraded, the anterior pancreatic head mass is contiguous with the posterior wall the duodenum and medial wall of the descending duodenum. Coronal T2 imaging is highly suspicious for tumor invasion of the medial wall of the descending duodenum (see axial T2 image 30 of series 2 and axial T2 haste image 29 series 16. This region is not well evaluated on postcontrast imaging due to motion artifact but appearance is similar suggesting duodenal invasion. Vascular/Lymphatic: Other:  Trace free fluid noted around the liver. Musculoskeletal: No focal  suspicious marrow enhancement within the visualized bony anatomy. Skip  that trace free fluid noted around the liver IMPRESSION: 1. 6.5 x 5.8 x 4.9 cm rim enhancing mass in the anterior pancreatic head is contiguous with the posterior wall the duodenum bulb and medial wall of the descending duodenum. Imaging features suggest direct invasion of the medial wall of the descending duodenum. Primary pancreatic adenocarcinoma would be distinct consideration. Primary duodenal neoplasm with secondary involvement of the pancreatic head is considered less likely but not excluded. Lesion probably abuts the inferior aspect of the portal vein. 2. 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 3. Intrahepatic and extrahepatic biliary duct dilatation with abrupt cut off of the common bile duct just proximal to the ampulla. This is associated with mild diffuse dilatation of the main pancreatic duct. 4. Mass lesion compresses the IVC posteriorly against the spine. IVC involvement not excluded. 5. No evidence for hepatic metastases nor metastatic lymphadenopathy in the abdomen. 6. Trace free fluid around the liver. Electronically Signed   By: Misty Stanley M.D.   On: 06/29/2022 12:08   CT Abdomen Pelvis W Contrast  Result Date: 06/29/2022 CLINICAL DATA:  Found on floor beside bed, left hip pain. EXAM: CT ABDOMEN AND PELVIS WITH CONTRAST TECHNIQUE: Multidetector CT imaging of the abdomen and pelvis was performed using the standard protocol following bolus administration of intravenous contrast. RADIATION DOSE REDUCTION: This exam was performed according to the departmental dose-optimization program which includes automated exposure control, adjustment of the mA and/or kV according to patient size and/or use of iterative reconstruction technique. CONTRAST:  51m OMNIPAQUE IOHEXOL 300 MG/ML  SOLN COMPARISON:  10/06/2019. FINDINGS: Lower chest: Atelectasis is present at the lung bases.  Hepatobiliary: There is mild nodularity of the liver. No focal abnormality is identified. Intrahepatic and extrahepatic biliary ductal dilatation is noted. The common bile duct measures up to 1.6 cm in diameter. The gallbladder is surgically absent. Pancreas: Hypodense masslike region is present in the pancreatic head measuring 6.5 x 4.8 x 6.5 cm. There is dilatation of the pancreatic duct measuring up to 6 mm. Peripancreatic fat stranding is noted at the head. Spleen: Normal in size without focal abnormality. Adrenals/Urinary Tract: No adrenal nodule or mass. The kidneys enhance symmetrically. A punctate calculus is noted in the upper pole of the right kidney. There is mild-to-moderate hydroureteronephrosis on the right with urothelial enhancement with a 3 mm stone in the anticipated region of the distal right ureter, axial image 67 possible ureteral calculus or phlebolith. The bladder is within normal limits. Stomach/Bowel: Stomach is within normal limits. There is bowel wall thickening involving the first second and third portions of the duodenum. No bowel obstruction, free air, or pneumatosis. The appendix is not seen. Vascular/Lymphatic: The portal vein and splenic vein are within normal limits. Nonocclusive thrombus is present in the superior mesenteric vein at the level of the pancreatic lesion. Aortic atherosclerosis without evidence of aneurysm. No abdominal or pelvic lymphadenopathy. Reproductive: Status post hysterectomy. No adnexal masses. Other: Trace amount of perihepatic fluid is noted. 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 Fixation hardware is noted in the proximal left femur. Degenerative changes are present in the thoracolumbar spine. Stable compression deformities are present in the superior endplates at L1 and L2. Severe degenerative changes are noted at L4-L5 and L5-S1, unchanged from 2020. No acute osseous abnormality. IMPRESSION: 1. Hypodense masslike  region in the pancreatic head with surrounding fat stranding measuring 6.5 x 4.8 x 6.5 cm resulting in biliary and pancreatic  duct ductal dilatation. Findings are concerning for pancreatic adenocarcinoma versus pancreatitis with developing abscess. Correlation with amylase and lipase and CA 19-9 is recommended. MRI with contrast is suggested for further evaluation. 2. 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 3. Morphologic changes of cirrhosis in the liver. 4. Mild-to-moderate hydroureteronephrosis on the right with ureteral enhancement. A 3 mm calculus is present in the anticipated region of the distal right ureter, possible ureteral calculus or phlebolith. Clinical correlation is recommended to exclude superimposed infection. 5. Aortic atherosclerosis. Electronically Signed   By: Brett Fairy M.D.   On: 06/29/2022 04:15   CT Cervical Spine Wo Contrast  Result Date: 06/29/2022 CLINICAL DATA:  Trauma. EXAM: CT HEAD WITHOUT CONTRAST CT CERVICAL SPINE WITHOUT CONTRAST TECHNIQUE: Multidetector CT imaging of the head and cervical spine was performed following the standard protocol without intravenous contrast. Multiplanar CT image reconstructions of the cervical spine were also generated. RADIATION DOSE REDUCTION: This exam was performed according to the departmental dose-optimization program which includes automated exposure control, adjustment of the mA and/or kV according to patient size and/or use of iterative reconstruction technique. COMPARISON:  Head CT dated 10/31/2018. FINDINGS: CT HEAD FINDINGS Brain: Mild age-related atrophy and chronic microvascular ischemic changes. There is no acute intracranial hemorrhage. No mass effect or midline shift. No extra-axial fluid collection. Vascular: No hyperdense vessel or unexpected calcification. Skull: Normal. Negative for fracture or focal lesion. Sinuses/Orbits: No acute finding. Other: None CT CERVICAL SPINE  FINDINGS Alignment: No acute subluxation. Skull base and vertebrae: No acute fracture. Soft tissues and spinal canal: No prevertebral fluid or swelling. No visible canal hematoma. Disc levels:  Multilevel degenerative changes. Upper chest: Negative. Other: None IMPRESSION: 1. No acute intracranial pathology. Mild age-related atrophy and chronic microvascular ischemic changes. 2. No acute cervical spine pathology. Multilevel degenerative changes. Electronically Signed   By: Anner Crete M.D.   On: 06/29/2022 02:57   CT Head Wo Contrast  Result Date: 06/29/2022 CLINICAL DATA:  Trauma. EXAM: CT HEAD WITHOUT CONTRAST CT CERVICAL SPINE WITHOUT CONTRAST TECHNIQUE: Multidetector CT imaging of the head and cervical spine was performed following the standard protocol without intravenous contrast. Multiplanar CT image reconstructions of the cervical spine were also generated. RADIATION DOSE REDUCTION: This exam was performed according to the departmental dose-optimization program which includes automated exposure control, adjustment of the mA and/or kV according to patient size and/or use of iterative reconstruction technique. COMPARISON:  Head CT dated 10/31/2018. FINDINGS: CT HEAD FINDINGS Brain: Mild age-related atrophy and chronic microvascular ischemic changes. There is no acute intracranial hemorrhage. No mass effect or midline shift. No extra-axial fluid collection. Vascular: No hyperdense vessel or unexpected calcification. Skull: Normal. Negative for fracture or focal lesion. Sinuses/Orbits: No acute finding. Other: None CT CERVICAL SPINE FINDINGS Alignment: No acute subluxation. Skull base and vertebrae: No acute fracture. Soft tissues and spinal canal: No prevertebral fluid or swelling. No visible canal hematoma. Disc levels:  Multilevel degenerative changes. Upper chest: Negative. Other: None IMPRESSION: 1. No acute intracranial pathology. Mild age-related atrophy and chronic microvascular ischemic  changes. 2. No acute cervical spine pathology. Multilevel degenerative changes. Electronically Signed   By: Anner Crete M.D.   On: 06/29/2022 02:57   DG Chest Port 1 View  Result Date: 06/29/2022 CLINICAL DATA:  Fall. EXAM: PORTABLE CHEST 1 VIEW COMPARISON:  03/19/2019. FINDINGS: Heart is enlarged and the mediastinal contour is within normal limits. Atherosclerotic calcification of the aorta is noted. No consolidation, effusion, or  pneumothorax. No acute osseous abnormality. IMPRESSION: No active disease. Electronically Signed   By: Brett Fairy M.D.   On: 06/29/2022 00:29   DG Knee Complete 4 Views Right  Result Date: 06/29/2022 CLINICAL DATA:  Fall. EXAM: RIGHT KNEE - COMPLETE 4+ VIEW COMPARISON:  02/07/2013. FINDINGS: No evidence of fracture, dislocation, or joint effusion. Mild tricompartmental degenerative changes are noted. Soft tissue swelling is present anterior to the knee. IMPRESSION: 1. No acute fracture or dislocation. 2. Mild tricompartmental degenerative changes. Electronically Signed   By: Brett Fairy M.D.   On: 06/29/2022 00:28   DG HIPS BILAT WITH PELVIS 3-4 VIEWS  Result Date: 06/29/2022 CLINICAL DATA:  Fall. EXAM: DG HIP (WITH OR WITHOUT PELVIS) 3-4V BILAT COMPARISON:  10/27/2016, 12/19/2020, 10/06/2019. FINDINGS: There is no evidence of acute hip fracture or dislocation. There is bony deformity of the inferior pubic ramus on the left which is new from the previous exam. Fixation hardware is present in the left femur with bony deformity of the mid femoral shaft compatible with old healed fracture. Joint space is maintained at the hips bilaterally. Degenerative changes are present in the lower lumbar spine. IMPRESSION: 1. Bony deformity of the inferior pubic ramus on the left which is new from 2020, suggesting old healed fracture. Clinical correlation is recommended to exclude acute fracture. 2. Old healed fracture of the distal left femur with hardware in place. Electronically  Signed   By: Brett Fairy M.D.   On: 06/29/2022 00:25    Review of Systems negative except above Blood pressure (!) 102/58, pulse 80, temperature 97.8 F (36.6 C), temperature source Oral, resp. rate 12, height '5\' 8"'$  (1.727 m), weight 59 kg, SpO2 100 %. 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 Benson Norway needs my assistance and probably he will start with an endoscopy as part of the EUS which would help answer the duodenal question and I will check on tomorrow and discussed the above procedures further with the patient and her family who had just left and I briefly discussed it with the patient and her nurse  [facility] E 06/29/2022, 12:46 PM

## 2022-06-29 NOTE — Progress Notes (Signed)
   06/29/22 1533  Vitals  ECG Heart Rate 77  MEWS COLOR  MEWS Score Color Yellow  MEWS Score  MEWS Temp 0  MEWS Systolic 1  MEWS Pulse 0  MEWS RR 0  MEWS LOC 1  MEWS Score 2  Provider Notification  Provider Name/Title Eleonore Chiquito, MD  Date Provider Notified 06/29/22  Time Provider Notified 1536  Method of Notification Page  Notification Reason Critical result  Test performed and critical result Lactic acid, 2.5  Date Critical Result Received 06/29/22  Time Critical Result Received 1534  Provider response See new orders  Date of Provider Response 06/29/22  Time of Provider Response 1548   250 cc bolus of LR and IV ABX ordered.  Layla Maw, RN

## 2022-06-30 DIAGNOSIS — N179 Acute kidney failure, unspecified: Secondary | ICD-10-CM

## 2022-06-30 DIAGNOSIS — F1010 Alcohol abuse, uncomplicated: Secondary | ICD-10-CM | POA: Diagnosis not present

## 2022-06-30 DIAGNOSIS — K8689 Other specified diseases of pancreas: Secondary | ICD-10-CM

## 2022-06-30 DIAGNOSIS — R400 Somnolence: Secondary | ICD-10-CM

## 2022-06-30 DIAGNOSIS — N139 Obstructive and reflux uropathy, unspecified: Secondary | ICD-10-CM

## 2022-06-30 DIAGNOSIS — G9340 Encephalopathy, unspecified: Secondary | ICD-10-CM | POA: Diagnosis not present

## 2022-06-30 LAB — COMPREHENSIVE METABOLIC PANEL
ALT: 70 U/L — ABNORMAL HIGH (ref 0–44)
AST: 202 U/L — ABNORMAL HIGH (ref 15–41)
Albumin: 2.3 g/dL — ABNORMAL LOW (ref 3.5–5.0)
Alkaline Phosphatase: 421 U/L — ABNORMAL HIGH (ref 38–126)
Anion gap: 9 (ref 5–15)
BUN: 12 mg/dL (ref 8–23)
CO2: 28 mmol/L (ref 22–32)
Calcium: 7.9 mg/dL — ABNORMAL LOW (ref 8.9–10.3)
Chloride: 98 mmol/L (ref 98–111)
Creatinine, Ser: 0.56 mg/dL (ref 0.44–1.00)
GFR, Estimated: >60 mL/min (ref >60)
Glucose, Bld: 86 mg/dL (ref 70–99)
Potassium: 2.6 mmol/L — CL (ref 3.5–5.1)
Sodium: 135 mmol/L (ref 135–145)
Total Bilirubin: 8.3 mg/dL — ABNORMAL HIGH (ref 0.3–1.2)
Total Protein: 5.7 g/dL — ABNORMAL LOW (ref 6.5–8.1)

## 2022-06-30 LAB — CBC
HCT: 25.2 % — ABNORMAL LOW (ref 36.0–46.0)
Hemoglobin: 8.6 g/dL — ABNORMAL LOW (ref 12.0–15.0)
MCH: 24.6 pg — ABNORMAL LOW (ref 26.0–34.0)
MCHC: 34.1 g/dL (ref 30.0–36.0)
MCV: 72 fL — ABNORMAL LOW (ref 80.0–100.0)
Platelets: 401 10*3/uL — ABNORMAL HIGH (ref 150–400)
RBC: 3.5 MIL/uL — ABNORMAL LOW (ref 3.87–5.11)
RDW: 20.7 % — ABNORMAL HIGH (ref 11.5–15.5)
WBC: 12.3 10*3/uL — ABNORMAL HIGH (ref 4.0–10.5)
nRBC: 0 % (ref 0.0–0.2)

## 2022-06-30 LAB — CANCER ANTIGEN 19-9: CA 19-9: 19 U/mL (ref 0–35)

## 2022-06-30 MED ORDER — VANCOMYCIN HCL 1250 MG/250ML IV SOLN
1250.0000 mg | INTRAVENOUS | Status: DC
Start: 2022-06-30 — End: 2022-07-01
  Administered 2022-06-30: 1250 mg via INTRAVENOUS
  Filled 2022-06-30 (×2): qty 250

## 2022-06-30 MED ORDER — OXYCODONE HCL 5 MG PO TABS
5.0000 mg | ORAL_TABLET | ORAL | Status: DC | PRN
  Administered 2022-06-30 – 2022-07-04 (×10): 5 mg via ORAL
  Filled 2022-06-30 (×10): qty 1

## 2022-06-30 MED ORDER — BISACODYL 10 MG RE SUPP
10.0000 mg | Freq: Once | RECTAL | Status: AC
  Administered 2022-06-30: 10 mg via RECTAL
  Filled 2022-06-30: qty 1

## 2022-06-30 MED ORDER — POTASSIUM CHLORIDE 10 MEQ/100ML IV SOLN
10.0000 meq | INTRAVENOUS | Status: AC
  Administered 2022-06-30 (×5): 10 meq via INTRAVENOUS
  Filled 2022-06-30 (×5): qty 100

## 2022-06-30 MED ORDER — CEFEPIME HCL 2 G IV SOLR
2.0000 g | Freq: Three times a day (TID) | INTRAVENOUS | Status: DC
  Administered 2022-06-30 – 2022-07-01 (×3): 2 g via INTRAVENOUS
  Filled 2022-06-30 (×3): qty 12.5

## 2022-06-30 NOTE — Progress Notes (Signed)
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 Benson Norway tomorrow and will discuss it further with Dr. Benson Norway later today via epic chat and phone call if needed and please let me know if I can be of any further assistance with this hospital stay however if significant duodenal mass biopsies can be obtained but there is a chance ERCP scope would not be able to find the ampulla but beginning with an endoscopy will answer that question and certainly if no duodenal mass and just extrinsic compression possibly an EUS with biopsy will be needed followed by ERCP for attempts at stenting  [facility] E  office [phone] After 5PM or if no answer call [phone]

## 2022-06-30 NOTE — Progress Notes (Signed)
Pharmacy Antibiotic Note  [name] is a 76 y.o. female admitted on 06/28/2022 with altered mental status. 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 CXR negative. Pharmacy has been consulted for vancomycin and cefepime dosing.  Today, 06/30/2022: - day #2 abx - Tmax 99.2 - wbc 12.3 - scr down 0.56 (crcl~61)  Plan: - adjust Vancomycin dose to 1250 mg IV q24 hours (eAUC 506) and Cefepime to 2g IV q8h - Flagyl '500mg'$  IV q12 hours  - F/u culture data, clinical improvement, ability to narrow antibiotics ____________________________________________  Height: '5\' 8"'$  (172.7 cm) Weight: 62.1 kg (136 lb 14.5 oz) IBW/kg (Calculated) : 63.9  Temp (24hrs), Avg:98.5 F (36.9 C), Min:97.8 F (36.6 C), Max:99.2 F (37.3 C)  Recent Labs  Lab 06/29/22 0032 06/29/22 0049 06/29/22 0253 06/29/22 0705 06/29/22 1502 06/30/22 0348  WBC  --  15.1*  --  15.0*  --  12.3*  CREATININE 1.20*  --   --  1.05*  --  0.56  LATICACIDVEN  --   --  1.1  --  2.5*  --      Estimated Creatinine Clearance: 61.4 mL/min (by C-G formula based on SCr of 0.56 mg/dL).    Allergies  Allergen Reactions   Azithromycin Itching and Swelling   Morphine And Related Itching    Thank you for allowing pharmacy to be a part of this patient's care.  Dia Sitter, PharmD, BCPS 06/30/2022 11:31 AM

## 2022-06-30 NOTE — Plan of Care (Signed)
  Problem: Clinical Measurements: Goal: Ability to maintain clinical measurements within normal limits will improve Outcome: Progressing   Problem: Nutrition: Goal: Adequate nutrition will be maintained Outcome: Progressing   Problem: Coping: Goal: Level of anxiety will decrease Outcome: Progressing   Problem: Safety: Goal: Ability to remain free from injury will improve Outcome: Progressing   

## 2022-06-30 NOTE — Consult Note (Signed)
Urology Consult  Referring physician: Dr. 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 CT also showed moderate right hydronephrosis to the level of the UVJ and a questionable right distal ureteral calculus. She had a distended bladder at the time of the CT. No history of nephrolithiasis. She denies any right flank pain. She then underwent MRI abd for the pancreatic mass which showed resolution of the right hydronephrosis and an empty bladder. She denies nay significant LUTS. No hematuria or dysuria.  Past Medical History:  Diagnosis Date   Alcohol abuse    Alcohol abuse    Allergy    Arthritis    back-severe, hips, right knee   Asthma    GERD (gastroesophageal reflux disease)    occasional   H/O measles    H/O mumps    Hypercholesteremia    under control   Insomnia    Psoriasis (a type of skin inflammation)    Renal cell carcinoma 2012   left   Seasonal allergies    Past Surgical History:  Procedure Laterality Date   ABDOMINAL HYSTERECTOMY  40years ago   BUNIONECTOMY  04/2011   CHOLECYSTECTOMY  11/13/2011   Procedure: LAPAROSCOPIC CHOLECYSTECTOMY WITH INTRAOPERATIVE CHOLANGIOGRAM;  Surgeon: Judieth Keens, DO;  [facility];  Service: General;  Laterality: N/A;   COLONOSCOPY N/A 09/25/2013   Procedure: COLONOSCOPY;  Surgeon: Lear Ng, MD;  [facility];  Service: Endoscopy;  Laterality: N/A;   ESOPHAGOGASTRODUODENOSCOPY N/A 09/25/2013   Procedure: ESOPHAGOGASTRODUODENOSCOPY (EGD);  Surgeon: Lear Ng, MD;  [facility];  Service: Endoscopy;  Laterality: N/A;   ESOPHAGOGASTRODUODENOSCOPY N/A 04/07/2016   Procedure: ESOPHAGOGASTRODUODENOSCOPY (EGD);  Surgeon: Milus Banister, MD;  [facility];  Service: Endoscopy;  Laterality: N/A;    ESOPHAGOGASTRODUODENOSCOPY N/A 10/26/2016   Procedure: ESOPHAGOGASTRODUODENOSCOPY (EGD);  Surgeon: Arta Silence, MD;  [facility];  Service: Endoscopy;  Laterality: N/A;   ESOPHAGOGASTRODUODENOSCOPY N/A 12/07/2016   Procedure: ESOPHAGOGASTRODUODENOSCOPY (EGD);  Surgeon: Wonda Horner, MD;  [facility];  Service: Endoscopy;  Laterality: N/A;   ESOPHAGOGASTRODUODENOSCOPY (EGD) WITH PROPOFOL Left 01/03/2016   Procedure: ESOPHAGOGASTRODUODENOSCOPY (EGD) WITH PROPOFOL;  Surgeon: Arta Silence, MD;  [facility];  Service: Endoscopy;  Laterality: Left;   FEMUR IM NAIL Left 04/15/2016   Procedure: INTRAMEDULLARY (IM) RETROGRADE FEMORAL NAILING;  Surgeon: Gaynelle Arabian, MD;  [facility];  Service: Orthopedics;  Laterality: Left;   HERNIA REPAIR  11/2876   supraumbilical repair   KIDNEY SURGERY  12/2010   [facility]; partial nephrectomy   LUMBAR LAMINECTOMY/DECOMPRESSION MICRODISCECTOMY Right 10/12/2014   Procedure: HEMI LAMINECTOMY MICRODISCECTOMY L5-S1 RIGHT (1 LEVEL);  Surgeon: Tobi Bastos, MD;  [facility];  Service: Orthopedics;  Laterality: Right;   MENISECTOMY  2010   left knee   TUBAL LIGATION  44 years ago    Medications: I have reviewed the patient's current medications. Allergies:  Allergies  Allergen Reactions   Azithromycin Itching and Swelling   Morphine And Related Itching    Family History  Problem Relation Age of Onset   Hyperlipidemia Mother    Hypertension Mother    Social History:  reports that she has been smoking cigarettes. She has a 0.90 pack-year smoking history. She has never used smokeless tobacco. She reports current alcohol use of about 2.0 standard  drinks of alcohol per week. She reports that she does not use drugs.  Review of Systems  Gastrointestinal:  Positive for abdominal pain.  All other systems reviewed and are negative.   Physical Exam:  Vital signs in last 24 hours: Temp:  [98.3 F (36.8 C)-99.2 F (37.3  C)] 98.9 F (37.2 C) (08/20 1244) Pulse Rate:  [79-108] 87 (08/20 1244) Resp:  [16-17] 16 (08/20 1244) BP: (110-118)/(55-64) 110/63 (08/20 1244) SpO2:  [98 %-100 %] 100 % (08/20 1244) Physical Exam Vitals reviewed.  Constitutional:      Appearance: Normal appearance.  HENT:     Head: Normocephalic and atraumatic.     Mouth/Throat:     Mouth: Mucous membranes are dry.  Eyes:     Extraocular Movements: Extraocular movements intact.     Pupils: Pupils are equal, round, and reactive to light.  Cardiovascular:     Rate and Rhythm: Normal rate and regular rhythm.  Pulmonary:     Effort: Pulmonary effort is normal. No respiratory distress.  Abdominal:     Palpations: Abdomen is soft.     Tenderness: There is abdominal tenderness.  Musculoskeletal:        General: No swelling. Normal range of motion.     Cervical back: Normal range of motion and neck supple.  Skin:    General: Skin is warm and dry.  Neurological:     General: No focal deficit present.     Mental Status: She is alert and oriented to person, place, and time.  Psychiatric:        Mood and Affect: Mood normal.        Behavior: Behavior normal.        Thought Content: Thought content normal.        Judgment: Judgment normal.     Laboratory Data:  Results for orders placed or performed during the hospital encounter of 06/28/22 (from the past 72 hour(s))  CBG monitoring, ED     Status: Abnormal   Collection Time: 06/29/22 12:31 AM  Result Value Ref Range   Glucose-Capillary 102 (H) 70 - 99 mg/dL    Comment: Glucose reference range applies only to samples taken after fasting for at least 8 hours.  Comprehensive metabolic panel     Status: Abnormal   Collection Time: 06/29/22 12:32 AM  Result Value Ref Range   Sodium 131 (L) 135 - 145 mmol/L   Potassium 2.2 (LL) 3.5 - 5.1 mmol/L    Comment: CRITICAL RESULT CALLED TO, READ BACK BY AND VERIFIED WITH KELSEY, A @ [id number] JMK    Chloride 86 (L) 98 - 111 mmol/L    CO2 29 22 - 32 mmol/L   Glucose, Bld 95 70 - 99 mg/dL    Comment: Glucose reference range applies only to samples taken after fasting for at least 8 hours.   BUN 21 8 - 23 mg/dL   Creatinine, Ser 1.20 (H) 0.44 - 1.00 mg/dL   Calcium 8.5 (L) 8.9 - 10.3 mg/dL   Total Protein 7.1 6.5 - 8.1 g/dL   Albumin 2.9 (L) 3.5 - 5.0 g/dL   AST 186 (H) 15 - 41 U/L   ALT 74 (H) 0 - 44 U/L   Alkaline Phosphatase 512 (H) 38 - 126 U/L   Total Bilirubin 8.2 (H) 0.3 - 1.2 mg/dL   GFR, Estimated 48 (L) >60 mL/min    Comment: (NOTE) Calculated using the CKD-EPI Creatinine Equation (2021)    Anion gap 16 (H) 5 -  15    Comment: Performed at [facility], [place] [address] [place], [place] [address]  Magnesium     Status: None   Collection Time: 06/29/22 12:32 AM  Result Value Ref Range   Magnesium 2.0 1.7 - 2.4 mg/dL    Comment: Performed at [facility], [place] [address] [place], [place] [address]  Lipase, blood     Status: None   Collection Time: 06/29/22 12:32 AM  Result Value Ref Range   Lipase 49 11 - 51 U/L    Comment: Performed at [facility], [place] [address], [place], [place] [address]  CK     Status: Abnormal   Collection Time: 06/29/22 12:32 AM  Result Value Ref Range   Total CK 1,903 (H) 38 - 234 U/L    Comment: Performed at [facility], [place] [address], [place], [place] [address]  Ethanol     Status: None   Collection Time: 06/29/22 12:49 AM  Result Value Ref Range   Alcohol, Ethyl (B) <10 <10 mg/dL    Comment: (NOTE) Lowest detectable limit for serum alcohol is 10 mg/dL.  For medical purposes only. Performed at [facility], [place] [address], [place], [place] [address]   Acetaminophen level     Status: Abnormal   Collection Time: 06/29/22 12:49 AM  Result Value Ref Range   Acetaminophen (Tylenol), Serum <10 (L) 10 - 30 ug/mL    Comment: (NOTE) Therapeutic concentrations vary  significantly. A range of 10-30 ug/mL  may be an effective concentration for many patients. However, some  are best treated at concentrations outside of this range. Acetaminophen concentrations >150 ug/mL at 4 hours after ingestion  and >50 ug/mL at 12 hours after ingestion are often associated with  toxic reactions.  Performed at [facility], [place] [address], [place], [place] [address]   Salicylate level     Status: Abnormal   Collection Time: 06/29/22 12:49 AM  Result Value Ref Range   Salicylate Lvl <4.2 (L) 7.0 - 30.0 mg/dL    Comment: Performed at [facility], [place] [address] [place],  [address]  CBC with Differential/Platelet     Status: Abnormal   Collection Time: 06/29/22 12:49 AM  Result Value Ref Range   WBC 15.1 (H) 4.0 - 10.5 K/uL   RBC 4.01 3.87 - 5.11 MIL/uL   Hemoglobin 10.0 (L) 12.0 - 15.0 g/dL   HCT 29.6 (L) 36.0 - 46.0 %   MCV 73.8 (L) 80.0 - 100.0 fL   MCH 24.9 (L) 26.0 - 34.0 pg   MCHC 33.8 30.0 - 36.0 g/dL   RDW 20.6 (H) 11.5 - 15.5 %   Platelets 426 (H) 150 - 400 K/uL   nRBC 0.0 0.0 - 0.2 %   Neutrophils Relative % 81 %   Neutro Abs 12.0 (H) 1.7 - 7.7 K/uL   Lymphocytes Relative 9 %   Lymphs Abs 1.3 0.7 - 4.0 K/uL   Monocytes Relative 10 %   Monocytes Absolute 1.6 (H) 0.1 - 1.0 K/uL   Eosinophils Relative 0 %   Eosinophils Absolute 0.0 0.0 - 0.5 K/uL   Basophils Relative 0 %   Basophils Absolute 0.0 0.0 - 0.1 K/uL   Immature Granulocytes 0 %   Abs Immature Granulocytes 0.06 0.00 - 0.07 K/uL    Comment: Performed at [facility], [place] [address] [place],  [address]  Blood gas, venous  Status: Abnormal   Collection Time: 06/29/22  1:11 AM  Result Value Ref Range   pH, Ven 7.49 (H) 7.25 - 7.43   pCO2, Ven 53 44 - 60 mmHg   pO2, Ven 45 32 - 45 mmHg   Bicarbonate 40.4 (H) 20.0 - 28.0 mmol/L   Acid-Base Excess 14.6 (H) 0.0 - 2.0 mmol/L   O2 Saturation 73.6 %   Patient  temperature 37.0     Comment: Performed at [facility], [place] [address], [place], [place] [address]  Protime-INR     Status: Abnormal   Collection Time: 06/29/22  1:33 AM  Result Value Ref Range   Prothrombin Time 16.7 (H) 11.4 - 15.2 seconds   INR 1.4 (H) 0.8 - 1.2    Comment: (NOTE) INR goal varies based on device and disease states. Performed at [facility], [place] [address] [place], [place] [address]   Ammonia     Status: Abnormal   Collection Time: 06/29/22  1:33 AM  Result Value Ref Range   Ammonia 59 (H) 9 - 35 umol/L    Comment: Performed at [facility], [place] [address], [place], [place] [address]  Culture, blood (routine x 2)     Status: None (Preliminary result)   Collection Time: 06/29/22  2:53 AM   Specimen: BLOOD  Result Value Ref Range   Specimen Description      BLOOD RIGHT ANTECUBITAL Performed at [facility], [place] [address], [place], [place] [address]    Special Requests      BOTTLES DRAWN AEROBIC AND ANAEROBIC Blood Culture results may not be optimal due to an excessive volume of blood received in culture bottles Performed at [place] [address], [place], [place] [address]    Culture      NO GROWTH 1 DAY Performed at [place] [address] [place], [place] [address]    Report Status PENDING   Culture, blood (routine x 2)     Status: None (Preliminary result)   Collection Time: 06/29/22  2:53 AM   Specimen: BLOOD  Result Value Ref Range   Specimen Description      BLOOD LEFT ANTECUBITAL Performed at [facility], [place] [address], [place], [place] [address]    Special Requests      BOTTLES DRAWN AEROBIC AND ANAEROBIC Blood Culture results may not be optimal due to an excessive volume of blood received in culture bottles Performed at [place] [address], [place], [place] [address]    Culture       NO GROWTH 1 DAY Performed at [place] [address] [place], [place] [address]    Report Status PENDING   Lactic acid, plasma     Status: None   Collection Time: 06/29/22  2:53 AM  Result Value Ref Range   Lactic Acid, Venous 1.1 0.5 - 1.9 mmol/L    Comment: Performed at [facility], [place] [address], [place], [place] [address]  Urinalysis, Routine w reflex microscopic     Status: Abnormal   Collection Time: 06/29/22  5:14 AM  Result Value Ref Range   Color, Urine AMBER (A) YELLOW    Comment: BIOCHEMICALS MAY BE AFFECTED BY COLOR   APPearance CLEAR CLEAR   Specific Gravity, Urine 1.011 1.005 - 1.030   pH 6.0 5.0 - 8.0   Glucose, UA NEGATIVE NEGATIVE mg/dL   Hgb urine  dipstick LARGE (A) NEGATIVE   Bilirubin Urine NEGATIVE NEGATIVE   Ketones, ur NEGATIVE NEGATIVE mg/dL   Protein, ur NEGATIVE NEGATIVE mg/dL   Nitrite POSITIVE (A) NEGATIVE   Leukocytes,Ua NEGATIVE NEGATIVE   RBC / HPF 6-10 0 - 5 RBC/hpf   WBC, UA 0-5 0 - 5 WBC/hpf   Bacteria, UA FEW (A) NONE SEEN   Squamous Epithelial / LPF 0-5 0 - 5    Comment: Performed at [facility], [place] [address] [place], [place] [address]  Urine Culture     Status: Abnormal (Preliminary result)   Collection Time: 06/29/22  5:14 AM   Specimen: In/Out Cath Urine  Result Value Ref Range   Specimen Description      IN/OUT CATH URINE Performed at [facility], [place] [address] [place], [place] [address]    Special Requests      NONE Performed at [facility], [place] [address], [place], [place] [address]    Culture >=100,000 COLONIES/mL ESCHERICHIA COLI (A)    Report Status PENDING   Vitamin B12     Status: Abnormal   Collection Time: 06/29/22  7:04 AM  Result Value Ref Range   Vitamin B-12 7,454 (H) 180 - 914 pg/mL    Comment: RESULTS CONFIRMED BY MANUAL DILUTION Performed at [facility], [place] [address], [place], [place]  [address]   Folate     Status: None   Collection Time: 06/29/22  7:04 AM  Result Value Ref Range   Folate >40.0 >5.9 ng/mL    Comment: RESULTS CONFIRMED BY MANUAL DILUTION Performed at [place] [address], [place], [place] [address]   Iron and TIBC     Status: Abnormal   Collection Time: 06/29/22  7:04 AM  Result Value Ref Range   Iron 23 (L) 28 - 170 ug/dL   TIBC 343 250 - 450 ug/dL   Saturation Ratios 7 (L) 10.4 - 31.8 %   UIBC 320 ug/dL    Comment: Performed at [facility], [place] [address] [place], [place] [address]  Ferritin     Status: Abnormal   Collection Time: 06/29/22  7:04 AM  Result Value Ref Range   Ferritin 9 (L) 11 - 307 ng/mL    Comment: Performed at [facility], [place] [address] [place], [place] [address]  Reticulocytes     Status: Abnormal   Collection Time: 06/29/22  7:04 AM  Result Value Ref Range   Retic Ct Pct 2.3 0.4 - 3.1 %   RBC. 3.68 (L) 3.87 - 5.11 MIL/uL   Retic Count, Absolute 85.4 19.0 - 186.0 K/uL   Immature Retic Fract 22.5 (H) 2.3 - 15.9 %    Comment: Performed at [facility], [place] [address] [place], [place] [address]  Cancer antigen 19-9     Status: None   Collection Time: 06/29/22  7:05 AM  Result Value Ref Range   CA 19-9 19 0 - 35 U/mL    Comment: (NOTE) Roche [employer]ostics Electrochemiluminescence Immunoassay (ECLIA) Values obtained with different assay methods or kits cannot be used interchangeably.  Results cannot be interpreted as absolute evidence of the presence or absence of malignant disease. Performed At: [facility] [place], [place] [id number] Rush Farmer MD IW:[id number]   CK     Status: Abnormal   Collection Time: 06/29/22  7:05 AM  Result Value Ref Range   Total CK 3,101 (H)  38 - 234 U/L    Comment: Performed at [facility], [place] [address], [place], [place] [address]  CBC     Status: Abnormal    Collection Time: 06/29/22  7:05 AM  Result Value Ref Range   WBC 15.0 (H) 4.0 - 10.5 K/uL   RBC 3.60 (L) 3.87 - 5.11 MIL/uL   Hemoglobin 8.8 (L) 12.0 - 15.0 g/dL    Comment: Reticulocyte Hemoglobin testing may be clinically indicated, consider ordering this additional test [id number]    HCT 27.0 (L) 36.0 - 46.0 %   MCV 75.0 (L) 80.0 - 100.0 fL   MCH 24.4 (L) 26.0 - 34.0 pg   MCHC 32.6 30.0 - 36.0 g/dL   RDW 20.8 (H) 11.5 - 15.5 %   Platelets 421 (H) 150 - 400 K/uL   nRBC 0.0 0.0 - 0.2 %    Comment: Performed at [facility], [place] [address] [place], [place] [address]  Comprehensive metabolic panel     Status: Abnormal   Collection Time: 06/29/22  7:05 AM  Result Value Ref Range   Sodium 134 (L) 135 - 145 mmol/L   Potassium 2.1 (LL) 3.5 - 5.1 mmol/L    Comment: CRITICAL RESULT CALLED TO, READ BACK BY AND VERIFIED WITH BARBER,M AT 1950 ON 06/29/22 BY LUZOLOP    Chloride 91 (L) 98 - 111 mmol/L   CO2 30 22 - 32 mmol/L   Glucose, Bld 90 70 - 99 mg/dL    Comment: Glucose reference range applies only to samples taken after fasting for at least 8 hours.   BUN 20 8 - 23 mg/dL   Creatinine, Ser 1.05 (H) 0.44 - 1.00 mg/dL   Calcium 8.1 (L) 8.9 - 10.3 mg/dL   Total Protein 6.3 (L) 6.5 - 8.1 g/dL   Albumin 2.7 (L) 3.5 - 5.0 g/dL   AST 212 (H) 15 - 41 U/L   ALT 73 (H) 0 - 44 U/L   Alkaline Phosphatase 481 (H) 38 - 126 U/L   Total Bilirubin 7.6 (H) 0.3 - 1.2 mg/dL   GFR, Estimated 56 (L) >60 mL/min    Comment: (NOTE) Calculated using the CKD-EPI Creatinine Equation (2021)    Anion gap 13 5 - 15    Comment: Performed at [facility], [place] [address] [place], [place] [address]  Lactic acid, plasma     Status: Abnormal   Collection Time: 06/29/22  3:02 PM  Result Value Ref Range   Lactic Acid, Venous 2.5 (HH) 0.5 - 1.9 mmol/L    Comment: CRITICAL RESULT CALLED TO, READ BACK BY AND VERIFIED WITH CALLENDER,J AT 1536 ON 06/29/22 BY LUZOLOP Performed  at [facility], [place] [address], [place], [place] [address]   CBC     Status: Abnormal   Collection Time: 06/30/22  3:48 AM  Result Value Ref Range   WBC 12.3 (H) 4.0 - 10.5 K/uL   RBC 3.50 (L) 3.87 - 5.11 MIL/uL   Hemoglobin 8.6 (L) 12.0 - 15.0 g/dL    Comment: Reticulocyte Hemoglobin testing may be clinically indicated, consider ordering this additional test [id number]    HCT 25.2 (L) 36.0 - 46.0 %   MCV 72.0 (L) 80.0 - 100.0 fL   MCH 24.6 (L) 26.0 - 34.0 pg   MCHC 34.1 30.0 - 36.0 g/dL   RDW 20.7 (H) 11.5 - 15.5 %   Platelets 401 (H) 150 - 400 K/uL   nRBC 0.0 0.0 - 0.2 %  Comment: Performed at [facility], [place] [address] [place], [place] [address]  Comprehensive metabolic panel     Status: Abnormal   Collection Time: 06/30/22  3:48 AM  Result Value Ref Range   Sodium 135 135 - 145 mmol/L   Potassium 2.6 (LL) 3.5 - 5.1 mmol/L    Comment: CRITICAL RESULT CALLED TO, READ BACK BY AND VERIFIED WITH KEIVA,R. 06/30/22 '@0434'$  BY SEEL,M. DELTA CHECK NOTED    Chloride 98 98 - 111 mmol/L   CO2 28 22 - 32 mmol/L   Glucose, Bld 86 70 - 99 mg/dL    Comment: Glucose reference range applies only to samples taken after fasting for at least 8 hours.   BUN 12 8 - 23 mg/dL   Creatinine, Ser 0.56 0.44 - 1.00 mg/dL   Calcium 7.9 (L) 8.9 - 10.3 mg/dL   Total Protein 5.7 (L) 6.5 - 8.1 g/dL   Albumin 2.3 (L) 3.5 - 5.0 g/dL   AST 202 (H) 15 - 41 U/L   ALT 70 (H) 0 - 44 U/L   Alkaline Phosphatase 421 (H) 38 - 126 U/L   Total Bilirubin 8.3 (H) 0.3 - 1.2 mg/dL   GFR, Estimated >60 >60 mL/min    Comment: (NOTE) Calculated using the CKD-EPI Creatinine Equation (2021)    Anion gap 9 5 - 15    Comment: Performed at [facility], [place] [address], [place], [place] [address]   Recent Results (from the past 240 hour(s))  Culture, blood (routine x 2)     Status: None (Preliminary result)   Collection Time: 06/29/22  2:53 AM   Specimen: BLOOD   Result Value Ref Range Status   Specimen Description   Final    BLOOD RIGHT ANTECUBITAL Performed at [place] [address] [place], [place] [address]    Special Requests   Final    BOTTLES DRAWN AEROBIC AND ANAEROBIC Blood Culture results may not be optimal due to an excessive volume of blood received in culture bottles Performed at [place] [address] [place], [place] [address]    Culture   Final    NO GROWTH 1 DAY Performed at [facility], [place] [address] [place], [place] [address]    Report Status PENDING  Incomplete  Culture, blood (routine x 2)     Status: None (Preliminary result)   Collection Time: 06/29/22  2:53 AM   Specimen: BLOOD  Result Value Ref Range Status   Specimen Description   Final    BLOOD LEFT ANTECUBITAL Performed at [place] [address], [place], [place] [address]    Special Requests   Final    BOTTLES DRAWN AEROBIC AND ANAEROBIC Blood Culture results may not be optimal due to an excessive volume of blood received in culture bottles Performed at [place] [address], [place], [place] [address]    Culture   Final    NO GROWTH 1 DAY Performed at [facility], [place] [address], [place], [place] [address]    Report Status PENDING  Incomplete  Urine Culture     Status: Abnormal (Preliminary result)   Collection Time: 06/29/22  5:14 AM   Specimen: In/Out Cath Urine  Result Value Ref Range Status   Specimen Description   Final    IN/OUT CATH URINE Performed at [place] [address] [place], [place] [address]    Special  Requests   Final    NONE Performed at [facility], [place] [address], [place], [place] [address]    Culture >=100,000 COLONIES/mL ESCHERICHIA COLI (A)  Final   Report Status PENDING  Incomplete   Creatinine: Recent Labs    06/29/22 0032 06/29/22 0705 06/30/22 0348   CREATININE 1.20* 1.05* 0.56   Baseline Creatinine: 0.5  Impression/Assessment:  75yo with right hydronephrosis  Plan:  I discussed the natural history of hydronephrosis with the patient and the various causes of hydronephrosis. Since her hydronephrosis resolved when her bladder was empty the hydronephrosis is likely not caused by a ureteral calculus and the calcification on CT is likely a phlebolith. No further workup is needed at this time.    Nicolette Bang 06/30/2022, 4:05 PM

## 2022-06-30 NOTE — Progress Notes (Signed)
[facility]  PROGRESS NOTE  [name] LTJ:[id number] DOB: 1946/06/02 DOA: 06/28/2022 PCP: Lois Huxley, PA   Brief HPI:    76 y.o. female with medical history significant of alcohol abuse, chronic pain with history of oxycodone overdose in the past, depression, asthma, GERD, hyperlipidemia, psoriasis, left renal cell carcinoma status post partial nephrectomy in 2012, history of cholecystectomy presents to the ED from home after daughter suspected accidental oxycodone overdose.  She was found on the ground beside her bed and was not able to stand or ambulate.  Complained of left hip pain.  Per EMS, medication counts were normal.  Pupils equal and reactive.  Patient was lethargic on initial evaluation done in the ED but later became more awake and alert.  Vital signs stable.  No Narcan given.  Daughter told ED physician that patient has been abusing oxycodone for years and also drinks alcohol heavily.  Labs showing WBC 15.1, hemoglobin 10.0 with MCV 73.8 (hemoglobin previously normal 3 years ago), platelet count 426k (chronic mild elevation), sodium 131, potassium 2.2, chloride 86, creatinine 1.2 (baseline 0.6-0.8), calcium 8.5, albumin 2.9, AST 186, ALT 74, alkaline phosphatase 512, T. bili 8.2, lipase normal, INR 1.4, magnesium normal, CK 1903, blood ethanol level undetectable, acetaminophen level undetectable, salicylate level undetectable, VBG with pH 7.49 and PCO2 53, ammonia 59, lactic acid normal.  Blood cultures drawn.  UA and urine culture pending.  Chest x-ray showing no active disease.  X-ray of right knee negative for fracture or dislocation.  X-ray of hips showing bony deformity of the inferior pubic ramus on the left which is new from 2020, suggesting old healed fracture.  Also showing old healed fracture of the distal left femur with hardware in place.  CT head and C-spine negative for acute finding.   Patient had abdominal tenderness on exam.  CT abdomen pelvis with contrast  showing: "IMPRESSION: 1. Hypodense masslike region in the pancreatic head with surrounding fat stranding measuring 6.5 x 4.8 x 6.5 cm resulting in biliary and pancreatic duct ductal dilatation. Findings are concerning for pancreatic adenocarcinoma versus pancreatitis with developing abscess. Correlation with amylase and lipase and CA 19-9 is recommended. MRI with contrast is suggested for further evaluation. 2. 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 3. Morphologic changes of cirrhosis in the liver. 4. Mild-to-moderate hydroureteronephrosis on the right with ureteral enhancement. A 3 mm calculus is present in the anticipated region of the distal right ureter, possible ureteral calculus or phlebolith. Clinical correlation is recommended to exclude superimposed infection. 5. Aortic atherosclerosis."     Subjective   Patient is more awake and alert this morning.  Answering questions appropriately.   Assessment/Plan:    Acute metabolic encephalopathy -Resolved -Likely in setting of polypharmacy; patient was on oxycodone, Flexeril, trazodone, Ativan, gabapentin -Patient was somnolent however able to answer my questions. -Avoid sedating medications     Pancreatic mass seen on CT abdomen/pelvis -CT showing a hypodense masslike region in the pancreatic head with surrounding fat stranding measuring 6.5 x 4.8 x 6.5 cm resulting in biliary and pancreatic duct ductal dilatation. Findings are concerning for pancreatic adenocarcinoma versus pancreatitis with developing abscess -MRCP abdomen obtained confirms pancreatic mass -Gastroenterology consulted, likely patient will need EUS and ERCP -We will keep n.p.o. after midnight for possible endoscopy tomorrow -Also discussed with oncology, Dr. Irene Limbo; oncology will follow patient on Monday   Nonocclusive SMV thrombus -Discussed with oncology, will hold anticoagulation at this time; till  patient gets endoscopic ultrasound/ERCP and biopsy -Will eventually need to be started on anticoagulation -Stool for occult blood is pending  Rhabdomyolysis -CK was elevated to 3,101 -Continue LR at 125 mill per hour -Follow CK level in a.m.   Obstructive uropathy/possible infected kidney stone -CT showed mild to moderate hydroureteronephrosis on the right with ureteral enhancement -A 3 mm calculus present within the anticipated region of the distal right ureter, possible ureteral calculus or phlebolith -UA with positive nitrite -Started on ceftriaxone, will change antibiotics to vancomycin, cefepime and Flagyl -Urology consulted   Alcohol abuse -CIWA protocol   Hypokalemia -Replace potassium and follow BMP in am   Medications     folic acid  1 mg Oral Daily   multivitamin with minerals  1 tablet Oral Daily   thiamine  100 mg Oral Daily   Or   thiamine  100 mg Intravenous Daily     Data Reviewed:   CBG:  Recent Labs  Lab 06/29/22 0031  GLUCAP 102*    SpO2: 100 %    Vitals:   06/29/22 2047 06/30/22 0003 06/30/22 0230 06/30/22 1244  BP: 111/64 (!) 112/55  110/63  Pulse: 92 (!) 108 98 87  Resp: 17   16  Temp: 99.2 F (37.3 C) 98.9 F (37.2 C)  98.9 F (37.2 C)  TempSrc: Oral Oral  Oral  SpO2: 100% 98%  100%  Weight:      Height:          Data Reviewed:  Basic Metabolic Panel: Recent Labs  Lab 06/29/22 0032 06/29/22 0705 06/30/22 0348  NA 131* 134* 135  K 2.2* 2.1* 2.6*  CL 86* 91* 98  CO2 '29 30 28  '$ GLUCOSE 95 90 86  BUN '21 20 12  '$ CREATININE 1.20* 1.05* 0.56  CALCIUM 8.5* 8.1* 7.9*  MG 2.0  --   --     CBC: Recent Labs  Lab 06/29/22 0049 06/29/22 0705 06/30/22 0348  WBC 15.1* 15.0* 12.3*  NEUTROABS 12.0*  --   --   HGB 10.0* 8.8* 8.6*  HCT 29.6* 27.0* 25.2*  MCV 73.8* 75.0* 72.0*  PLT 426* 421* 401*    LFT Recent Labs  Lab 06/29/22 0032 06/29/22 0705 06/30/22 0348  AST 186* 212* 202*  ALT 74* 73* 70*  ALKPHOS 512*  481* 421*  BILITOT 8.2* 7.6* 8.3*  PROT 7.1 6.3* 5.7*  ALBUMIN 2.9* 2.7* 2.3*     Antibiotics: Anti-infectives (From admission, onward)    Start     Dose/Rate Route Frequency Ordered Stop   06/30/22 1659  ceFEPIme (MAXIPIME) 2 g in sodium chloride 0.9 % 100 mL IVPB        2 g 200 mL/hr over 30 Minutes Intravenous Every 8 hours 06/30/22 1132     06/30/22 1600  vancomycin (VANCOCIN) IVPB 1000 mg/200 mL premix  Status:  Discontinued        1,000 mg 200 mL/hr over 60 Minutes Intravenous Every 24 hours 06/29/22 1551 06/30/22 1132   06/30/22 1600  vancomycin (VANCOREADY) IVPB 1250 mg/250 mL        1,250 mg 166.7 mL/hr over 90 Minutes Intravenous Every 24 hours 06/30/22 1132     06/29/22 1645  ceFEPIme (MAXIPIME) 2 g in sodium chloride 0.9 % 100 mL IVPB  Status:  Discontinued        2 g 200 mL/hr over 30 Minutes Intravenous Every 12 hours 06/29/22 1551 06/30/22 1132   06/29/22 1645  vancomycin (VANCOREADY) IVPB 1250 mg/250 mL  1,250 mg 166.7 mL/hr over 90 Minutes Intravenous  Once 06/29/22 1551 06/29/22 1908   06/29/22 1645  metroNIDAZOLE (FLAGYL) IVPB 500 mg        500 mg 100 mL/hr over 60 Minutes Intravenous Every 12 hours 06/29/22 1551     06/29/22 0730  cefTRIAXone (ROCEPHIN) 1 g in sodium chloride 0.9 % 100 mL IVPB  Status:  Discontinued        1 g 200 mL/hr over 30 Minutes Intravenous Every 24 hours 06/29/22 0717 06/29/22 1539        DVT prophylaxis: SCDs  Code Status: Full code  Family Communication: No family at bedside   CONSULTS gastroenterology   Objective    Physical Examination:   General-appears in no acute distress Heart-S1-S2, regular, no murmur auscultated Lungs-clear to auscultation bilaterally, no wheezing or crackles auscultated Abdomen-soft, nontender, no organomegaly Extremities-no edema in the lower extremities Neuro-alert, oriented x3, no focal deficit noted  Status is: Inpatient:             Oswald Hillock   [facility] If 7PM-7AM, please contact night-coverage at [email or web address], Office  [phone]   06/30/2022, 2:22 PM  LOS: 1 day

## 2022-07-01 DIAGNOSIS — N139 Obstructive and reflux uropathy, unspecified: Secondary | ICD-10-CM | POA: Diagnosis not present

## 2022-07-01 DIAGNOSIS — G9340 Encephalopathy, unspecified: Secondary | ICD-10-CM | POA: Diagnosis not present

## 2022-07-01 DIAGNOSIS — K8689 Other specified diseases of pancreas: Secondary | ICD-10-CM | POA: Diagnosis not present

## 2022-07-01 DIAGNOSIS — N179 Acute kidney failure, unspecified: Secondary | ICD-10-CM | POA: Diagnosis not present

## 2022-07-01 DIAGNOSIS — M6282 Rhabdomyolysis: Secondary | ICD-10-CM

## 2022-07-01 LAB — COMPREHENSIVE METABOLIC PANEL WITH GFR
ALT: 75 U/L — ABNORMAL HIGH (ref 0–44)
AST: 192 U/L — ABNORMAL HIGH (ref 15–41)
Albumin: 2.2 g/dL — ABNORMAL LOW (ref 3.5–5.0)
Alkaline Phosphatase: 380 U/L — ABNORMAL HIGH (ref 38–126)
Anion gap: 10 (ref 5–15)
BUN: 8 mg/dL (ref 8–23)
CO2: 24 mmol/L (ref 22–32)
Calcium: 7.9 mg/dL — ABNORMAL LOW (ref 8.9–10.3)
Chloride: 103 mmol/L (ref 98–111)
Creatinine, Ser: 0.46 mg/dL (ref 0.44–1.00)
GFR, Estimated: >60 mL/min
Glucose, Bld: 89 mg/dL (ref 70–99)
Potassium: 2.9 mmol/L — ABNORMAL LOW (ref 3.5–5.1)
Sodium: 137 mmol/L (ref 135–145)
Total Bilirubin: 9.8 mg/dL — ABNORMAL HIGH (ref 0.3–1.2)
Total Protein: 5.6 g/dL — ABNORMAL LOW (ref 6.5–8.1)

## 2022-07-01 LAB — URINE CULTURE: Culture: >=100000 — AB

## 2022-07-01 LAB — CK: Total CK: 1378 U/L — ABNORMAL HIGH (ref 38–234)

## 2022-07-01 LAB — OCCULT BLOOD X 1 CARD TO LAB, STOOL: Fecal Occult Bld: POSITIVE — AB

## 2022-07-01 MED ORDER — LORAZEPAM 2 MG/ML IJ SOLN
1.0000 mg | Freq: Once | INTRAMUSCULAR | Status: AC
  Administered 2022-07-01: 1 mg via INTRAVENOUS
  Filled 2022-07-01: qty 1

## 2022-07-01 MED ORDER — SODIUM CHLORIDE 0.9 % IV SOLN
2.0000 g | INTRAVENOUS | Status: DC
  Administered 2022-07-01 – 2022-07-02 (×2): 2 g via INTRAVENOUS
  Filled 2022-07-01 (×2): qty 20

## 2022-07-01 MED ORDER — POTASSIUM CHLORIDE 10 MEQ/100ML IV SOLN
INTRAVENOUS | Status: AC
  Administered 2022-07-01: 10 meq via INTRAVENOUS
  Filled 2022-07-01: qty 100

## 2022-07-01 MED ORDER — POTASSIUM CHLORIDE 10 MEQ/100ML IV SOLN
10.0000 meq | INTRAVENOUS | Status: AC
  Administered 2022-07-01 (×6): 10 meq via INTRAVENOUS
  Filled 2022-07-01 (×6): qty 100

## 2022-07-01 MED ORDER — POTASSIUM CHLORIDE CRYS ER 20 MEQ PO TBCR
40.0000 meq | EXTENDED_RELEASE_TABLET | Freq: Two times a day (BID) | ORAL | Status: DC
  Administered 2022-07-01: 40 meq via ORAL
  Filled 2022-07-01 (×2): qty 2

## 2022-07-01 NOTE — Progress Notes (Signed)
Mobility Specialist - Progress Note     07/01/22 1346  Mobility  Activity Transferred to/from [facility]  Level of Assistance Standby assist, set-up cues, supervision of patient - no hands on  Assistive Device None  Distance Ambulated (ft) 5 ft  Activity Response Tolerated well  $Mobility charge 1 Mobility    Pt was found on BSC and was assisted with any necessities needed. Pt was left on bed with all necessities within reach and bed alarm on.   Ferd Hibbs Mobility Specialist

## 2022-07-01 NOTE — Progress Notes (Signed)
Brief oncology note:  Consult received.  Plan is for ERCP and EUS today by Dr. Benson Norway.  We will await these results.  CA 19.9 was already obtained and was normal at 19.  We will request CEA with next lab draw.  Also recommend CT chest to complete her staging work-up.  Full consult to follow later this week.  Mikey Bussing

## 2022-07-01 NOTE — Progress Notes (Signed)
Patient agitated, restless, confused, refusing PO meds. Provider aware and notified of CIWA findings

## 2022-07-01 NOTE — Progress Notes (Signed)
[facility]  PROGRESS NOTE  [name] OJJ:[id number] DOB: 04/26/1946 DOA: 06/28/2022 PCP: Lois Huxley, PA   Brief HPI:    76 y.o. female with medical history significant of alcohol abuse, chronic pain with history of oxycodone overdose in the past, depression, asthma, GERD, hyperlipidemia, psoriasis, left renal cell carcinoma status post partial nephrectomy in 2012, history of cholecystectomy presents to the ED from home after daughter suspected accidental oxycodone overdose.  She was found on the ground beside her bed and was not able to stand or ambulate.  Complained of left hip pain.  Per EMS, medication counts were normal.  Pupils equal and reactive.  Patient was lethargic on initial evaluation done in the ED but later became more awake and alert.  Vital signs stable.  No Narcan given.  Daughter told ED physician that patient has been abusing oxycodone for years and also drinks alcohol heavily.  Labs showing WBC 15.1, hemoglobin 10.0 with MCV 73.8 (hemoglobin previously normal 3 years ago), platelet count 426k (chronic mild elevation), sodium 131, potassium 2.2, chloride 86, creatinine 1.2 (baseline 0.6-0.8), calcium 8.5, albumin 2.9, AST 186, ALT 74, alkaline phosphatase 512, T. bili 8.2, lipase normal, INR 1.4, magnesium normal, CK 1903, blood ethanol level undetectable, acetaminophen level undetectable, salicylate level undetectable, VBG with pH 7.49 and PCO2 53, ammonia 59, lactic acid normal.  Blood cultures drawn.  UA and urine culture pending.  Chest x-ray showing no active disease.  X-ray of right knee negative for fracture or dislocation.  X-ray of hips showing bony deformity of the inferior pubic ramus on the left which is new from 2020, suggesting old healed fracture.  Also showing old healed fracture of the distal left femur with hardware in place.  CT head and C-spine negative for acute finding.   Patient had abdominal tenderness on exam.  CT abdomen pelvis with contrast  showing: "IMPRESSION: 1. Hypodense masslike region in the pancreatic head with surrounding fat stranding measuring 6.5 x 4.8 x 6.5 cm resulting in biliary and pancreatic duct ductal dilatation. Findings are concerning for pancreatic adenocarcinoma versus pancreatitis with developing abscess. Correlation with amylase and lipase and CA 19-9 is recommended. MRI with contrast is suggested for further evaluation. 2. 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 3. Morphologic changes of cirrhosis in the liver. 4. Mild-to-moderate hydroureteronephrosis on the right with ureteral enhancement. A 3 mm calculus is present in the anticipated region of the distal right ureter, possible ureteral calculus or phlebolith. Clinical correlation is recommended to exclude superimposed infection. 5. Aortic atherosclerosis."     Subjective   Patient seen and examined, denies any pain.   Assessment/Plan:    Acute metabolic encephalopathy -Resolved -Likely in setting of polypharmacy; patient was on oxycodone, Flexeril, trazodone, Ativan, gabapentin -Patient was somnolent however able to answer my questions. -Avoid sedating medications     Pancreatic mass seen on CT abdomen/pelvis -CT showing a hypodense masslike region in the pancreatic head with surrounding fat stranding measuring 6.5 x 4.8 x 6.5 cm resulting in biliary and pancreatic duct ductal dilatation. Findings are concerning for pancreatic adenocarcinoma versus pancreatitis with developing abscess -MRCP abdomen obtained confirms pancreatic mass -Gastroenterology consulted, likely patient will need EUS and ERCP -We will keep n.p.o. after midnight for endoscopic ultrasound/ERCP in a.m. -Medical oncology also consulted, they are following patient   Nonocclusive SMV thrombus -Discussed with oncology, will hold anticoagulation at this time; till patient gets endoscopic ultrasound/ERCP and  biopsy -Will  eventually need to be started on anticoagulation -Stool for occult blood is positive -We will discuss with oncology regarding anticoagulation  Rhabdomyolysis -CK was elevated to 3,101 -Continue LR at 125 mill per hour -CK level down to 1378   Obstructive uropathy/possible infected kidney stone -CT showed mild to moderate hydroureteronephrosis on the right with ureteral enhancement -A 3 mm calculus present within the anticipated region of the distal right ureter, possible ureteral calculus or phlebolith -UA with positive nitrite -Started on ceftriaxone, will change antibiotics to vancomycin, cefepime and Flagyl -Patient's clinical condition has improved, antibiotics changed to ceftriaxone -Urology consulted; patient hydronephrosis resolved and the bladder was empty, it was not called due to ureteral calculus and calcification on CT is likely phleboliths as per urology. -Urology has signed off   Alcohol abuse -CIWA protocol   Hypokalemia -Potassium is 2.9 this morning.  Will replace potassium and follow BMP in am   Medications     folic acid  1 mg Oral Daily   multivitamin with minerals  1 tablet Oral Daily   potassium chloride  40 mEq Oral BID   thiamine  100 mg Oral Daily   Or   thiamine  100 mg Intravenous Daily     Data Reviewed:   CBG:  Recent Labs  Lab 06/29/22 0031  GLUCAP 102*    SpO2: 100 %    Vitals:   06/30/22 1959 07/01/22 0436 07/01/22 0508 07/01/22 1227  BP: 119/74 116/75 122/72 109/65  Pulse: 91 97 92 91  Resp: '16 16 17 18  '$ Temp: 98.6 F (37 C) 98.9 F (37.2 C) 99 F (37.2 C) 98.4 F (36.9 C)  TempSrc:  Oral    SpO2: 100% 100% 100% 100%  Weight:      Height:          Data Reviewed:  Basic Metabolic Panel: Recent Labs  Lab 06/29/22 0032 06/29/22 0705 06/30/22 0348 07/01/22 0342  NA 131* 134* 135 137  K 2.2* 2.1* 2.6* 2.9*  CL 86* 91* 98 103  CO2 '29 30 28 24  '$ GLUCOSE 95 90 86 89  BUN '21 20 12 8  '$ CREATININE  1.20* 1.05* 0.56 0.46  CALCIUM 8.5* 8.1* 7.9* 7.9*  MG 2.0  --   --   --     CBC: Recent Labs  Lab 06/29/22 0049 06/29/22 0705 06/30/22 0348  WBC 15.1* 15.0* 12.3*  NEUTROABS 12.0*  --   --   HGB 10.0* 8.8* 8.6*  HCT 29.6* 27.0* 25.2*  MCV 73.8* 75.0* 72.0*  PLT 426* 421* 401*    LFT Recent Labs  Lab 06/29/22 0032 06/29/22 0705 06/30/22 0348 07/01/22 0342  AST 186* 212* 202* 192*  ALT 74* 73* 70* 75*  ALKPHOS 512* 481* 421* 380*  BILITOT 8.2* 7.6* 8.3* 9.8*  PROT 7.1 6.3* 5.7* 5.6*  ALBUMIN 2.9* 2.7* 2.3* 2.2*     Antibiotics: Anti-infectives (From admission, onward)    Start     Dose/Rate Route Frequency Ordered Stop   07/01/22 1700  cefTRIAXone (ROCEPHIN) 2 g in sodium chloride 0.9 % 100 mL IVPB        2 g 200 mL/hr over 30 Minutes Intravenous Every 24 hours 07/01/22 1341     06/30/22 1659  ceFEPIme (MAXIPIME) 2 g in sodium chloride 0.9 % 100 mL IVPB  Status:  Discontinued        2 g 200 mL/hr over 30 Minutes Intravenous Every 8 hours 06/30/22 1132 07/01/22 1341   06/30/22 1600  vancomycin (VANCOCIN) IVPB 1000 mg/200 mL premix  Status:  Discontinued        1,000 mg 200 mL/hr over 60 Minutes Intravenous Every 24 hours 06/29/22 1551 06/30/22 1132   06/30/22 1600  vancomycin (VANCOREADY) IVPB 1250 mg/250 mL  Status:  Discontinued        1,250 mg 166.7 mL/hr over 90 Minutes Intravenous Every 24 hours 06/30/22 1132 07/01/22 1341   06/29/22 1645  ceFEPIme (MAXIPIME) 2 g in sodium chloride 0.9 % 100 mL IVPB  Status:  Discontinued        2 g 200 mL/hr over 30 Minutes Intravenous Every 12 hours 06/29/22 1551 06/30/22 1132   06/29/22 1645  vancomycin (VANCOREADY) IVPB 1250 mg/250 mL        1,250 mg 166.7 mL/hr over 90 Minutes Intravenous  Once 06/29/22 1551 06/29/22 1908   06/29/22 1645  metroNIDAZOLE (FLAGYL) IVPB 500 mg  Status:  Discontinued        500 mg 100 mL/hr over 60 Minutes Intravenous Every 12 hours 06/29/22 1551 07/01/22 1341   06/29/22 0730  cefTRIAXone  (ROCEPHIN) 1 g in sodium chloride 0.9 % 100 mL IVPB  Status:  Discontinued        1 g 200 mL/hr over 30 Minutes Intravenous Every 24 hours 06/29/22 0717 06/29/22 1539        DVT prophylaxis: SCDs  Code Status: Full code  Family Communication: No family at bedside   CONSULTS gastroenterology   Objective    Physical Examination:   General-appears in no acute distress Heart-S1-S2, regular, no murmur auscultated Lungs-clear to auscultation bilaterally, no wheezing or crackles auscultated Abdomen-soft, nontender, no organomegaly Extremities-no edema in the lower extremities Neuro-alert, oriented x3, no focal deficit noted  Status is: Inpatient:             Oswald Hillock   [facility] If 7PM-7AM, please contact night-coverage at [email or web address], Office  [phone]   07/01/2022, 3:12 PM  LOS: 2 days

## 2022-07-01 NOTE — H&P (View-Only) (Signed)
UNASSIGNED PATIENT Subjective: [name] is a 76 year old black female with multiple medical problems including a history of alcohol abuse, chronic oxycodone abuse, depression, asthma, hyperlipidemia, left renal cell carcinoma status post nephrectomy 2012, GERD, history of cholecystectomy presented to the ER with suspected accidental overdose evaluation showed a hypodense mass in the head of the pancreas surrounded with fat stranding measuring 6.5 x 4.8 x 6.5 cm resulting in biliary and pancreatic duct dilatation patient is awaiting EUS and ERCP tomorrow. 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  Mild to moderate hydroureter ureteral nephrosis of the right kidney with ureteral enhancement and a 3 mm calculus was noted in the distal ureter. She denies having any complaints of abdominal pain predominantly in the periumbilical area but denies having any melena hematochezia. There is no history of nausea vomiting.  Objective: Vital signs in last 24 hours: Temp:  [98.6 F (37 C)-99 F (37.2 C)] 99 F (37.2 C) (08/21 0508) Pulse Rate:  [87-97] 92 (08/21 0508) Resp:  [16-17] 17 (08/21 0508) BP: (110-122)/(63-75) 122/72 (08/21 0508) SpO2:  [100 %] 100 % (08/21 0508) Last BM Date : 06/30/22  Intake/Output from previous day: 08/20 0701 - 08/21 0700 In: 3483.2 [P.O.:350; I.V.:2333.2; IV Piggyback:800] Out: 1400 [Urine:1400] Intake/Output this shift: No intake/output data recorded.  General appearance: cooperative, appears stated age, cachectic, fatigued, and no distress Resp: clear to auscultation bilaterally Cardio: regular rate and rhythm, S1, S2 normal, no murmur, click, rub or gallop GI: soft, mild tenderness on palpation in the epigastric and periumbilical region without guarding rebound or rigidity; patient has hypoactive bowel sounds normal; no masses,  no organomegaly  Lab Results: Recent Labs     06/29/22 0049 06/29/22 0705 06/30/22 0348  WBC 15.1* 15.0* 12.3*  HGB 10.0* 8.8* 8.6*  HCT 29.6* 27.0* 25.2*  PLT 426* 421* 401*   BMET Recent Labs    06/29/22 0705 06/30/22 0348 07/01/22 0342  NA 134* 135 137  K 2.1* 2.6* 2.9*  CL 91* 98 103  CO2 '30 28 24  '$ GLUCOSE 90 86 89  BUN '20 12 8  '$ CREATININE 1.05* 0.56 0.46  CALCIUM 8.1* 7.9* 7.9*   LFT Recent Labs    07/01/22 0342  PROT 5.6*  ALBUMIN 2.2*  AST 192*  ALT 75*  ALKPHOS 380*  BILITOT 9.8*   PT/INR Recent Labs    06/29/22 0133  LABPROT 16.7*  INR 1.4*   Studies/Results: MR ABDOMEN MRCP W WO CONTAST  Result Date: 06/29/2022 CLINICAL DATA:  Pancreatic head mass on CT scan earlier same day. EXAM: MRI ABDOMEN WITHOUT AND WITH CONTRAST (INCLUDING MRCP) TECHNIQUE: Multiplanar multisequence MR imaging of the abdomen was performed both before and after the administration of intravenous contrast. Heavily T2-weighted images of the biliary and pancreatic ducts were obtained, and three-dimensional MRCP images were rendered by post processing. CONTRAST:  31m GADAVIST GADOBUTROL 1 MMOL/ML IV SOLN COMPARISON:  CT scan abdomen/pelvis 06/29/2022 FINDINGS: Lower chest: Unremarkable. Hepatobiliary: No suspicious focal abnormality within the liver parenchyma. Gallbladder surgically absent. Mild intrahepatic biliary duct dilatation is associated with 16 mm diameter common duct in the porta hepatis and dilatation of the cystic duct and common bile duct abruptly tapering just proximal to the ampulla. Pancreas: 6.5 x 5.8 x 4.9 cm rim enhancing ill-defined mass is identified in the anterior pancreatic head. Mild diffuse dilatation of the main pancreatic duct although mass lesion is anterior to the common bile  duct and main pancreatic duct. Lesion probably abuts the inferior aspect portal vein and coronal imaging shows lateral margin of the lesion indistinguishable from the medial wall of the descending duodenum (see coronal 16/12). As on CT  scan earlier today, filling defect in the superior mesenteric vein (postcontrast image 63/series 30) is consistent with nonocclusive thrombus in the superior mesenteric vein near the portal splenic confluence. Spleen: No splenomegaly. No focal mass lesion. Adrenals/Urinary Tract: No adrenal nodule or mass. Kidneys unremarkable. Stomach/Bowel: Stomach is nondistended. Although assessment is motion degraded, the anterior pancreatic head mass is contiguous with the posterior wall the duodenum and medial wall of the descending duodenum. Coronal T2 imaging is highly suspicious for tumor invasion of the medial wall of the descending duodenum (see axial T2 image 30 of series 2 and axial T2 haste image 29 series 16. This region is not well evaluated on postcontrast imaging due to motion artifact but appearance is similar suggesting duodenal invasion. Vascular/Lymphatic: Other:  Trace free fluid noted around the liver. Musculoskeletal: No focal suspicious marrow enhancement within the visualized bony anatomy. Skip that trace free fluid noted around the liver IMPRESSION: 1. 6.5 x 5.8 x 4.9 cm rim enhancing mass in the anterior pancreatic head is contiguous with the posterior wall the duodenum bulb and medial wall of the descending duodenum. Imaging features suggest direct invasion of the medial wall of the descending duodenum. Primary pancreatic adenocarcinoma would be distinct consideration. Primary duodenal neoplasm with secondary involvement of the pancreatic head is considered less likely but not excluded. Lesion probably abuts the inferior aspect of the portal vein. 2. 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 3. Intrahepatic and extrahepatic biliary duct dilatation with abrupt cut off of the common bile duct just proximal to the ampulla. This is associated with mild diffuse dilatation of the main pancreatic duct. 4. Mass lesion compresses the IVC posteriorly against the  spine. IVC involvement not excluded. 5. No evidence for hepatic metastases nor metastatic lymphadenopathy in the abdomen. 6. Trace free fluid around the liver. Electronically Signed   By: Misty Stanley M.D.   On: 06/29/2022 12:08   MR 3D Recon At Scanner  Result Date: 06/29/2022 CLINICAL DATA:  Pancreatic head mass on CT scan earlier same day. EXAM: MRI ABDOMEN WITHOUT AND WITH CONTRAST (INCLUDING MRCP) TECHNIQUE: Multiplanar multisequence MR imaging of the abdomen was performed both before and after the administration of intravenous contrast. Heavily T2-weighted images of the biliary and pancreatic ducts were obtained, and three-dimensional MRCP images were rendered by post processing. CONTRAST:  47m GADAVIST GADOBUTROL 1 MMOL/ML IV SOLN COMPARISON:  CT scan abdomen/pelvis 06/29/2022 FINDINGS: Lower chest: Unremarkable. Hepatobiliary: No suspicious focal abnormality within the liver parenchyma. Gallbladder surgically absent. Mild intrahepatic biliary duct dilatation is associated with 16 mm diameter common duct in the porta hepatis and dilatation of the cystic duct and common bile duct abruptly tapering just proximal to the ampulla. Pancreas: 6.5 x 5.8 x 4.9 cm rim enhancing ill-defined mass is identified in the anterior pancreatic head. Mild diffuse dilatation of the main pancreatic duct although mass lesion is anterior to the common bile duct and main pancreatic duct. Lesion probably abuts the inferior aspect portal vein and coronal imaging shows lateral margin of the lesion indistinguishable from the medial wall of the descending duodenum (see coronal 16/12). As on CT scan earlier today, filling defect in the superior mesenteric vein (postcontrast image 63/series 30) is consistent with nonocclusive thrombus in the superior mesenteric vein  near the portal splenic confluence. Spleen: No splenomegaly. No focal mass lesion. Adrenals/Urinary Tract: No adrenal nodule or mass. Kidneys unremarkable. Stomach/Bowel:  Stomach is nondistended. Although assessment is motion degraded, the anterior pancreatic head mass is contiguous with the posterior wall the duodenum and medial wall of the descending duodenum. Coronal T2 imaging is highly suspicious for tumor invasion of the medial wall of the descending duodenum (see axial T2 image 30 of series 2 and axial T2 haste image 29 series 16. This region is not well evaluated on postcontrast imaging due to motion artifact but appearance is similar suggesting duodenal invasion. Vascular/Lymphatic: Other:  Trace free fluid noted around the liver. Musculoskeletal: No focal suspicious marrow enhancement within the visualized bony anatomy. Skip that trace free fluid noted around the liver IMPRESSION: 1. 6.5 x 5.8 x 4.9 cm rim enhancing mass in the anterior pancreatic head is contiguous with the posterior wall the duodenum bulb and medial wall of the descending duodenum. Imaging features suggest direct invasion of the medial wall of the descending duodenum. Primary pancreatic adenocarcinoma would be distinct consideration. Primary duodenal neoplasm with secondary involvement of the pancreatic head is considered less likely but not excluded. Lesion probably abuts the inferior aspect of the portal vein. 2. 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 3. Intrahepatic and extrahepatic biliary duct dilatation with abrupt cut off of the common bile duct just proximal to the ampulla. This is associated with mild diffuse dilatation of the main pancreatic duct. 4. Mass lesion compresses the IVC posteriorly against the spine. IVC involvement not excluded. 5. No evidence for hepatic metastases nor metastatic lymphadenopathy in the abdomen. 6. Trace free fluid around the liver. Electronically Signed   By: Misty Stanley M.D.   On: 06/29/2022 12:08    Medications: I have reviewed the patient's current medications. Prior to Admission:  Medications Prior to  Admission  Medication Sig Dispense Refill Last Dose   albuterol (PROVENTIL HFA;VENTOLIN HFA) 108 (90 Base) MCG/ACT inhaler Inhale 2 puffs into the lungs every 6 (six) hours as needed for wheezing or shortness of breath. (Patient taking differently: Inhale 2 puffs into the lungs 3 (three) times daily as needed for wheezing or shortness of breath.) 3 Inhaler 1 Past Week   cholestyramine (QUESTRAN) 4 g packet Take 1 packet (4 g total) by mouth 2 (two) times daily. (Patient taking differently: Take 4 g by mouth See admin instructions. 3-4 prn) 60 each 0 Past Week   [facility] MELATONIN 10 MG CAPS Take 20 mg by mouth at bedtime as needed (sleep).   Past Week   cyclobenzaprine (FLEXERIL) 10 MG tablet Take 10 mg by mouth in the morning and at bedtime.   unk   famotidine (PEPCID) 20 MG tablet Take 20 mg by mouth 2 (two) times daily.   Past Week   ferrous sulfate 325 (65 FE) MG tablet Take 1 tablet (325 mg total) by mouth 2 (two) times daily with a meal. (Patient taking differently: Take 325 mg by mouth 3 (three) times daily with meals.) 60 tablet 0 06/27/2022   clobetasol cream (TEMOVATE) 6.94 % Apply 1 application topically 2 (two) times daily as needed (flare ups). (Patient not taking: Reported on 06/30/2022)   Not Taking   Fluticasone-Salmeterol (ADVAIR DISKUS) 500-50 MCG/DOSE AEPB INHALE 1 PUFF INTO THE LUNGS TWICE A DAY (Patient not taking: Reported on 06/30/2022) 60 each 1 Not Taking   folic acid (FOLVITE) 1 MG tablet Take 1 tablet (1 mg total)  by mouth daily. 30 tablet 0    gabapentin (NEURONTIN) 100 MG capsule Take 100 mg by mouth 2 (two) times daily.      hydrochlorothiazide (HYDRODIURIL) 12.5 MG tablet Take 12.5 mg by mouth daily.      Icosapent Ethyl (VASCEPA) 1 g CAPS Take 1 g by mouth 2 (two) times daily.      Influenza vac split quadrivalent PF (FLUZONE HIGH-DOSE) 0.5 ML injection Inject 0.5 mLs into the muscle once.      loratadine (CLARITIN) 10 MG tablet Take 10 mg by mouth daily.      LORazepam  (ATIVAN) 1 MG tablet Take 1 tablet (1 mg total) by mouth 2 (two) times daily as needed for anxiety (Anxiety, shakiness. DO NOT USE WITH ALCOHOL.). 60 tablet 0    magnesium oxide (MAG-OX) 400 MG tablet Take 1 tablet (400 mg total) by mouth 2 (two) times daily. (Patient not taking: Reported on 03/19/2019) 60 tablet 0    montelukast (SINGULAIR) 10 MG tablet Take 10 mg by mouth daily.      Multiple Vitamin (MULTIVITAMIN WITH MINERALS) TABS tablet Take 1 tablet by mouth daily. (Patient not taking: Reported on 03/19/2019) 30 tablet 0    naloxegol oxalate (MOVANTIK) 25 MG TABS tablet Take 25 mg by mouth daily.      ondansetron (ZOFRAN ODT) 4 MG disintegrating tablet Take 1 tablet (4 mg total) by mouth every 8 (eight) hours as needed for nausea. 10 tablet 0    Oxycodone HCl 20 MG TABS Take 0.5 tablets (10 mg total) by mouth every 6 (six) hours as needed. (Patient taking differently: Take 20 mg by mouth every 6 (six) hours as needed (pain). )  0    pantoprazole (PROTONIX) 40 MG tablet Take 1 tablet (40 mg total) by mouth daily. 30 tablet 0    PARoxetine (PAXIL) 30 MG tablet Take 30 mg by mouth daily.      pneumococcal 13-valent conjugate vaccine (PREVNAR 13) SUSP injection Inject 0.5 mLs into the muscle once.      polyethylene glycol (MIRALAX / GLYCOLAX) packet Take 17 g by mouth daily. 14 each 0    potassium chloride SA (K-DUR,KLOR-CON) 20 MEQ tablet Take 1 tablet (20 mEq total) by mouth daily. 90 tablet 1    propranolol (INDERAL) 10 MG tablet Take 1 tablet (10 mg total) by mouth 2 (two) times daily. 60 tablet 0    senna-docusate (SENOKOT-S) 8.6-50 MG tablet Take 1 tablet by mouth 2 (two) times daily.      simvastatin (ZOCOR) 40 MG tablet Take 1 tablet (40 mg total) by mouth every morning. 90 tablet 1    sucralfate (CARAFATE) 1 g tablet Take 1 tablet by mouth 4 (four) times daily.      thiamine 100 MG tablet Take 1 tablet (100 mg total) by mouth daily. 30 tablet 0    traZODone (DESYREL) 50 MG tablet Take 1  tablet (50 mg total) by mouth at bedtime. 30 tablet 0    zolpidem (AMBIEN) 5 MG tablet Take 5 mg by mouth daily.      Scheduled:  folic acid  1 mg Oral Daily   multivitamin with minerals  1 tablet Oral Daily   potassium chloride  40 mEq Oral BID   thiamine  100 mg Oral Daily   Or   thiamine  100 mg Intravenous Daily   Continuous:  cefTRIAXone (ROCEPHIN)  IV     lactated ringers 125 mL/hr at 07/01/22 0305   potassium chloride  10 mEq (07/01/22 1356)    Assessment/Plan: 1) Obstructive jaundice with a hypodense mass in the pancreatic head-ERCP/EUS planned for tomorrow. 2) Nonocclusive SMV thrombosis. 3) Obstructive uropathy. 4) Alcohol abuse and polysubstance abuse on CIWA protocol. 5) GERD on PPI's.  LOS: 2 days   Juanita Craver 07/01/2022, 11:48 AM

## 2022-07-01 NOTE — Progress Notes (Addendum)
UNASSIGNED PATIENT Subjective: [name] is a 75 year old black female with multiple medical problems including a history of alcohol abuse, chronic oxycodone abuse, depression, asthma, hyperlipidemia, left renal cell carcinoma status post nephrectomy 2012, GERD, history of cholecystectomy presented to the ER with suspected accidental overdose evaluation showed a hypodense mass in the head of the pancreas surrounded with fat stranding measuring 6.5 x 4.8 x 6.5 cm resulting in biliary and pancreatic duct dilatation patient is awaiting EUS and ERCP tomorrow. 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  Mild to moderate hydroureter ureteral nephrosis of the right kidney with ureteral enhancement and a 3 mm calculus was noted in the distal ureter. She denies having any complaints of abdominal pain predominantly in the periumbilical area but denies having any melena hematochezia. There is no history of nausea vomiting.  Objective: Vital signs in last 24 hours: Temp:  [98.6 F (37 C)-99 F (37.2 C)] 99 F (37.2 C) (08/21 0508) Pulse Rate:  [87-97] 92 (08/21 0508) Resp:  [16-17] 17 (08/21 0508) BP: (110-122)/(63-75) 122/72 (08/21 0508) SpO2:  [100 %] 100 % (08/21 0508) Last BM Date : 06/30/22  Intake/Output from previous day: 08/20 0701 - 08/21 0700 In: 3483.2 [P.O.:350; I.V.:2333.2; IV Piggyback:800] Out: 1400 [Urine:1400] Intake/Output this shift: No intake/output data recorded.  General appearance: cooperative, appears stated age, cachectic, fatigued, and no distress Resp: clear to auscultation bilaterally Cardio: regular rate and rhythm, S1, S2 normal, no murmur, click, rub or gallop GI: soft, mild tenderness on palpation in the epigastric and periumbilical region without guarding rebound or rigidity; patient has hypoactive bowel sounds normal; no masses,  no organomegaly  Lab Results: Recent Labs     06/29/22 0049 06/29/22 0705 06/30/22 0348  WBC 15.1* 15.0* 12.3*  HGB 10.0* 8.8* 8.6*  HCT 29.6* 27.0* 25.2*  PLT 426* 421* 401*   BMET Recent Labs    06/29/22 0705 06/30/22 0348 07/01/22 0342  NA 134* 135 137  K 2.1* 2.6* 2.9*  CL 91* 98 103  CO2 '30 28 24  '$ GLUCOSE 90 86 89  BUN '20 12 8  '$ CREATININE 1.05* 0.56 0.46  CALCIUM 8.1* 7.9* 7.9*   LFT Recent Labs    07/01/22 0342  PROT 5.6*  ALBUMIN 2.2*  AST 192*  ALT 75*  ALKPHOS 380*  BILITOT 9.8*   PT/INR Recent Labs    06/29/22 0133  LABPROT 16.7*  INR 1.4*   Studies/Results: MR ABDOMEN MRCP W WO CONTAST  Result Date: 06/29/2022 CLINICAL DATA:  Pancreatic head mass on CT scan earlier same day. EXAM: MRI ABDOMEN WITHOUT AND WITH CONTRAST (INCLUDING MRCP) TECHNIQUE: Multiplanar multisequence MR imaging of the abdomen was performed both before and after the administration of intravenous contrast. Heavily T2-weighted images of the biliary and pancreatic ducts were obtained, and three-dimensional MRCP images were rendered by post processing. CONTRAST:  60m GADAVIST GADOBUTROL 1 MMOL/ML IV SOLN COMPARISON:  CT scan abdomen/pelvis 06/29/2022 FINDINGS: Lower chest: Unremarkable. Hepatobiliary: No suspicious focal abnormality within the liver parenchyma. Gallbladder surgically absent. Mild intrahepatic biliary duct dilatation is associated with 16 mm diameter common duct in the porta hepatis and dilatation of the cystic duct and common bile duct abruptly tapering just proximal to the ampulla. Pancreas: 6.5 x 5.8 x 4.9 cm rim enhancing ill-defined mass is identified in the anterior pancreatic head. Mild diffuse dilatation of the main pancreatic duct although mass lesion is anterior to the common bile  duct and main pancreatic duct. Lesion probably abuts the inferior aspect portal vein and coronal imaging shows lateral margin of the lesion indistinguishable from the medial wall of the descending duodenum (see coronal 16/12). As on CT  scan earlier today, filling defect in the superior mesenteric vein (postcontrast image 63/series 30) is consistent with nonocclusive thrombus in the superior mesenteric vein near the portal splenic confluence. Spleen: No splenomegaly. No focal mass lesion. Adrenals/Urinary Tract: No adrenal nodule or mass. Kidneys unremarkable. Stomach/Bowel: Stomach is nondistended. Although assessment is motion degraded, the anterior pancreatic head mass is contiguous with the posterior wall the duodenum and medial wall of the descending duodenum. Coronal T2 imaging is highly suspicious for tumor invasion of the medial wall of the descending duodenum (see axial T2 image 30 of series 2 and axial T2 haste image 29 series 16. This region is not well evaluated on postcontrast imaging due to motion artifact but appearance is similar suggesting duodenal invasion. Vascular/Lymphatic: Other:  Trace free fluid noted around the liver. Musculoskeletal: No focal suspicious marrow enhancement within the visualized bony anatomy. Skip that trace free fluid noted around the liver IMPRESSION: 1. 6.5 x 5.8 x 4.9 cm rim enhancing mass in the anterior pancreatic head is contiguous with the posterior wall the duodenum bulb and medial wall of the descending duodenum. Imaging features suggest direct invasion of the medial wall of the descending duodenum. Primary pancreatic adenocarcinoma would be distinct consideration. Primary duodenal neoplasm with secondary involvement of the pancreatic head is considered less likely but not excluded. Lesion probably abuts the inferior aspect of the portal vein. 2. 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 3. Intrahepatic and extrahepatic biliary duct dilatation with abrupt cut off of the common bile duct just proximal to the ampulla. This is associated with mild diffuse dilatation of the main pancreatic duct. 4. Mass lesion compresses the IVC posteriorly against the  spine. IVC involvement not excluded. 5. No evidence for hepatic metastases nor metastatic lymphadenopathy in the abdomen. 6. Trace free fluid around the liver. Electronically Signed   By: Misty Stanley M.D.   On: 06/29/2022 12:08   MR 3D Recon At Scanner  Result Date: 06/29/2022 CLINICAL DATA:  Pancreatic head mass on CT scan earlier same day. EXAM: MRI ABDOMEN WITHOUT AND WITH CONTRAST (INCLUDING MRCP) TECHNIQUE: Multiplanar multisequence MR imaging of the abdomen was performed both before and after the administration of intravenous contrast. Heavily T2-weighted images of the biliary and pancreatic ducts were obtained, and three-dimensional MRCP images were rendered by post processing. CONTRAST:  69m GADAVIST GADOBUTROL 1 MMOL/ML IV SOLN COMPARISON:  CT scan abdomen/pelvis 06/29/2022 FINDINGS: Lower chest: Unremarkable. Hepatobiliary: No suspicious focal abnormality within the liver parenchyma. Gallbladder surgically absent. Mild intrahepatic biliary duct dilatation is associated with 16 mm diameter common duct in the porta hepatis and dilatation of the cystic duct and common bile duct abruptly tapering just proximal to the ampulla. Pancreas: 6.5 x 5.8 x 4.9 cm rim enhancing ill-defined mass is identified in the anterior pancreatic head. Mild diffuse dilatation of the main pancreatic duct although mass lesion is anterior to the common bile duct and main pancreatic duct. Lesion probably abuts the inferior aspect portal vein and coronal imaging shows lateral margin of the lesion indistinguishable from the medial wall of the descending duodenum (see coronal 16/12). As on CT scan earlier today, filling defect in the superior mesenteric vein (postcontrast image 63/series 30) is consistent with nonocclusive thrombus in the superior mesenteric vein  near the portal splenic confluence. Spleen: No splenomegaly. No focal mass lesion. Adrenals/Urinary Tract: No adrenal nodule or mass. Kidneys unremarkable. Stomach/Bowel:  Stomach is nondistended. Although assessment is motion degraded, the anterior pancreatic head mass is contiguous with the posterior wall the duodenum and medial wall of the descending duodenum. Coronal T2 imaging is highly suspicious for tumor invasion of the medial wall of the descending duodenum (see axial T2 image 30 of series 2 and axial T2 haste image 29 series 16. This region is not well evaluated on postcontrast imaging due to motion artifact but appearance is similar suggesting duodenal invasion. Vascular/Lymphatic: Other:  Trace free fluid noted around the liver. Musculoskeletal: No focal suspicious marrow enhancement within the visualized bony anatomy. Skip that trace free fluid noted around the liver IMPRESSION: 1. 6.5 x 5.8 x 4.9 cm rim enhancing mass in the anterior pancreatic head is contiguous with the posterior wall the duodenum bulb and medial wall of the descending duodenum. Imaging features suggest direct invasion of the medial wall of the descending duodenum. Primary pancreatic adenocarcinoma would be distinct consideration. Primary duodenal neoplasm with secondary involvement of the pancreatic head is considered less likely but not excluded. Lesion probably abuts the inferior aspect of the portal vein. 2. 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 3. Intrahepatic and extrahepatic biliary duct dilatation with abrupt cut off of the common bile duct just proximal to the ampulla. This is associated with mild diffuse dilatation of the main pancreatic duct. 4. Mass lesion compresses the IVC posteriorly against the spine. IVC involvement not excluded. 5. No evidence for hepatic metastases nor metastatic lymphadenopathy in the abdomen. 6. Trace free fluid around the liver. Electronically Signed   By: Misty Stanley M.D.   On: 06/29/2022 12:08    Medications: I have reviewed the patient's current medications. Prior to Admission:  Medications Prior to  Admission  Medication Sig Dispense Refill Last Dose   albuterol (PROVENTIL HFA;VENTOLIN HFA) 108 (90 Base) MCG/ACT inhaler Inhale 2 puffs into the lungs every 6 (six) hours as needed for wheezing or shortness of breath. (Patient taking differently: Inhale 2 puffs into the lungs 3 (three) times daily as needed for wheezing or shortness of breath.) 3 Inhaler 1 Past Week   cholestyramine (QUESTRAN) 4 g packet Take 1 packet (4 g total) by mouth 2 (two) times daily. (Patient taking differently: Take 4 g by mouth See admin instructions. 3-4 prn) 60 each 0 Past Week   [employer] MELATONIN 10 MG CAPS Take 20 mg by mouth at bedtime as needed (sleep).   Past Week   cyclobenzaprine (FLEXERIL) 10 MG tablet Take 10 mg by mouth in the morning and at bedtime.   unk   famotidine (PEPCID) 20 MG tablet Take 20 mg by mouth 2 (two) times daily.   Past Week   ferrous sulfate 325 (65 FE) MG tablet Take 1 tablet (325 mg total) by mouth 2 (two) times daily with a meal. (Patient taking differently: Take 325 mg by mouth 3 (three) times daily with meals.) 60 tablet 0 06/27/2022   clobetasol cream (TEMOVATE) 0.25 % Apply 1 application topically 2 (two) times daily as needed (flare ups). (Patient not taking: Reported on 06/30/2022)   Not Taking   Fluticasone-Salmeterol (ADVAIR DISKUS) 500-50 MCG/DOSE AEPB INHALE 1 PUFF INTO THE LUNGS TWICE A DAY (Patient not taking: Reported on 06/30/2022) 60 each 1 Not Taking   folic acid (FOLVITE) 1 MG tablet Take 1 tablet (1 mg total)  by mouth daily. 30 tablet 0    gabapentin (NEURONTIN) 100 MG capsule Take 100 mg by mouth 2 (two) times daily.      hydrochlorothiazide (HYDRODIURIL) 12.5 MG tablet Take 12.5 mg by mouth daily.      Icosapent Ethyl (VASCEPA) 1 g CAPS Take 1 g by mouth 2 (two) times daily.      Influenza vac split quadrivalent PF (FLUZONE HIGH-DOSE) 0.5 ML injection Inject 0.5 mLs into the muscle once.      loratadine (CLARITIN) 10 MG tablet Take 10 mg by mouth daily.      LORazepam  (ATIVAN) 1 MG tablet Take 1 tablet (1 mg total) by mouth 2 (two) times daily as needed for anxiety (Anxiety, shakiness. DO NOT USE WITH ALCOHOL.). 60 tablet 0    magnesium oxide (MAG-OX) 400 MG tablet Take 1 tablet (400 mg total) by mouth 2 (two) times daily. (Patient not taking: Reported on 03/19/2019) 60 tablet 0    montelukast (SINGULAIR) 10 MG tablet Take 10 mg by mouth daily.      Multiple Vitamin (MULTIVITAMIN WITH MINERALS) TABS tablet Take 1 tablet by mouth daily. (Patient not taking: Reported on 03/19/2019) 30 tablet 0    naloxegol oxalate (MOVANTIK) 25 MG TABS tablet Take 25 mg by mouth daily.      ondansetron (ZOFRAN ODT) 4 MG disintegrating tablet Take 1 tablet (4 mg total) by mouth every 8 (eight) hours as needed for nausea. 10 tablet 0    Oxycodone HCl 20 MG TABS Take 0.5 tablets (10 mg total) by mouth every 6 (six) hours as needed. (Patient taking differently: Take 20 mg by mouth every 6 (six) hours as needed (pain). )  0    pantoprazole (PROTONIX) 40 MG tablet Take 1 tablet (40 mg total) by mouth daily. 30 tablet 0    PARoxetine (PAXIL) 30 MG tablet Take 30 mg by mouth daily.      pneumococcal 13-valent conjugate vaccine (PREVNAR 13) SUSP injection Inject 0.5 mLs into the muscle once.      polyethylene glycol (MIRALAX / GLYCOLAX) packet Take 17 g by mouth daily. 14 each 0    potassium chloride SA (K-DUR,KLOR-CON) 20 MEQ tablet Take 1 tablet (20 mEq total) by mouth daily. 90 tablet 1    propranolol (INDERAL) 10 MG tablet Take 1 tablet (10 mg total) by mouth 2 (two) times daily. 60 tablet 0    senna-docusate (SENOKOT-S) 8.6-50 MG tablet Take 1 tablet by mouth 2 (two) times daily.      simvastatin (ZOCOR) 40 MG tablet Take 1 tablet (40 mg total) by mouth every morning. 90 tablet 1    sucralfate (CARAFATE) 1 g tablet Take 1 tablet by mouth 4 (four) times daily.      thiamine 100 MG tablet Take 1 tablet (100 mg total) by mouth daily. 30 tablet 0    traZODone (DESYREL) 50 MG tablet Take 1  tablet (50 mg total) by mouth at bedtime. 30 tablet 0    zolpidem (AMBIEN) 5 MG tablet Take 5 mg by mouth daily.      Scheduled:  folic acid  1 mg Oral Daily   multivitamin with minerals  1 tablet Oral Daily   potassium chloride  40 mEq Oral BID   thiamine  100 mg Oral Daily   Or   thiamine  100 mg Intravenous Daily   Continuous:  cefTRIAXone (ROCEPHIN)  IV     lactated ringers 125 mL/hr at 07/01/22 0305   potassium chloride  10 mEq (07/01/22 1356)    Assessment/Plan: 1) Obstructive jaundice with a hypodense mass in the pancreatic head-ERCP/EUS planned for tomorrow. 2) Nonocclusive SMV thrombosis. 3) Obstructive uropathy. 4) Alcohol abuse and polysubstance abuse on CIWA protocol. 5) GERD on PPI's.  LOS: 2 days   Juanita Craver 07/01/2022, 11:48 AM

## 2022-07-02 ENCOUNTER — Inpatient Hospital Stay (HOSPITAL_COMMUNITY): Payer: Medicare HMO | Admitting: Anesthesiology

## 2022-07-02 ENCOUNTER — Encounter (HOSPITAL_COMMUNITY)
Admission: EM | Disposition: A | Discharge status: Home Health | Payer: Self-pay | Source: Home / Self Care | Attending: Family Medicine

## 2022-07-02 ENCOUNTER — Encounter (HOSPITAL_COMMUNITY): Payer: Self-pay | Admitting: Family Medicine

## 2022-07-02 ENCOUNTER — Inpatient Hospital Stay (HOSPITAL_COMMUNITY): Payer: Medicare HMO

## 2022-07-02 DIAGNOSIS — K7460 Unspecified cirrhosis of liver: Secondary | ICD-10-CM

## 2022-07-02 DIAGNOSIS — G9340 Encephalopathy, unspecified: Secondary | ICD-10-CM | POA: Diagnosis not present

## 2022-07-02 DIAGNOSIS — K831 Obstruction of bile duct: Secondary | ICD-10-CM

## 2022-07-02 DIAGNOSIS — I82890 Acute embolism and thrombosis of other specified veins: Secondary | ICD-10-CM | POA: Diagnosis not present

## 2022-07-02 DIAGNOSIS — K8689 Other specified diseases of pancreas: Secondary | ICD-10-CM | POA: Diagnosis not present

## 2022-07-02 DIAGNOSIS — N139 Obstructive and reflux uropathy, unspecified: Secondary | ICD-10-CM | POA: Diagnosis not present

## 2022-07-02 DIAGNOSIS — N179 Acute kidney failure, unspecified: Secondary | ICD-10-CM | POA: Diagnosis not present

## 2022-07-02 DIAGNOSIS — N134 Hydroureter: Secondary | ICD-10-CM

## 2022-07-02 DIAGNOSIS — J45909 Unspecified asthma, uncomplicated: Secondary | ICD-10-CM

## 2022-07-02 DIAGNOSIS — D509 Iron deficiency anemia, unspecified: Secondary | ICD-10-CM

## 2022-07-02 DIAGNOSIS — F17210 Nicotine dependence, cigarettes, uncomplicated: Secondary | ICD-10-CM

## 2022-07-02 DIAGNOSIS — E7800 Pure hypercholesterolemia, unspecified: Secondary | ICD-10-CM

## 2022-07-02 DIAGNOSIS — F1910 Other psychoactive substance abuse, uncomplicated: Secondary | ICD-10-CM

## 2022-07-02 HISTORY — PX: BILIARY STENT PLACEMENT: SHX5538

## 2022-07-02 HISTORY — PX: FINE NEEDLE ASPIRATION: SHX5430

## 2022-07-02 HISTORY — PX: EUS: SHX5427

## 2022-07-02 HISTORY — PX: ERCP: SHX5425

## 2022-07-02 HISTORY — PX: SPHINCTEROTOMY: SHX5279

## 2022-07-02 HISTORY — PX: ESOPHAGOGASTRODUODENOSCOPY: SHX5428

## 2022-07-02 LAB — CBC
HCT: 27.5 % — ABNORMAL LOW (ref 36.0–46.0)
Hemoglobin: 9.4 g/dL — ABNORMAL LOW (ref 12.0–15.0)
MCH: 24.4 pg — ABNORMAL LOW (ref 26.0–34.0)
MCHC: 34.2 g/dL (ref 30.0–36.0)
MCV: 71.2 fL — ABNORMAL LOW (ref 80.0–100.0)
Platelets: 365 10*3/uL (ref 150–400)
RBC: 3.86 MIL/uL — ABNORMAL LOW (ref 3.87–5.11)
RDW: 21.9 % — ABNORMAL HIGH (ref 11.5–15.5)
WBC: 11.4 10*3/uL — ABNORMAL HIGH (ref 4.0–10.5)
nRBC: 0 % (ref 0.0–0.2)

## 2022-07-02 LAB — BASIC METABOLIC PANEL
Anion gap: 11 (ref 5–15)
BUN: 6 mg/dL — ABNORMAL LOW (ref 8–23)
CO2: 24 mmol/L (ref 22–32)
Calcium: 7.6 mg/dL — ABNORMAL LOW (ref 8.9–10.3)
Chloride: 102 mmol/L (ref 98–111)
Creatinine, Ser: 0.43 mg/dL — ABNORMAL LOW (ref 0.44–1.00)
GFR, Estimated: >60 mL/min (ref >60)
Glucose, Bld: 91 mg/dL (ref 70–99)
Potassium: 3.2 mmol/L — ABNORMAL LOW (ref 3.5–5.1)
Sodium: 137 mmol/L (ref 135–145)

## 2022-07-02 LAB — POTASSIUM: Potassium: 3.8 mmol/L (ref 3.5–5.1)

## 2022-07-02 LAB — MAGNESIUM
Magnesium: 1 mg/dL — ABNORMAL LOW (ref 1.7–2.4)
Magnesium: 1.9 mg/dL (ref 1.7–2.4)

## 2022-07-02 SURGERY — ERCP, WITH INTERVENTION IF INDICATED
Anesthesia: General

## 2022-07-02 MED ORDER — LIDOCAINE 2% (20 MG/ML) 5 ML SYRINGE
INTRAMUSCULAR | Status: DC | PRN
  Administered 2022-07-02: 60 mg via INTRAVENOUS

## 2022-07-02 MED ORDER — FENTANYL CITRATE (PF) 250 MCG/5ML IJ SOLN
INTRAMUSCULAR | Status: DC | PRN
  Administered 2022-07-02: 100 ug via INTRAVENOUS

## 2022-07-02 MED ORDER — POTASSIUM CHLORIDE 10 MEQ/100ML IV SOLN
10.0000 meq | INTRAVENOUS | Status: DC
  Administered 2022-07-02 (×2): 10 meq via INTRAVENOUS
  Filled 2022-07-02 (×2): qty 100

## 2022-07-02 MED ORDER — DEXAMETHASONE SODIUM PHOSPHATE 10 MG/ML IJ SOLN
INTRAMUSCULAR | Status: DC | PRN
  Administered 2022-07-02: 6 mg via INTRAVENOUS

## 2022-07-02 MED ORDER — SODIUM CHLORIDE 0.9 % IV SOLN: INTRAVENOUS | Status: DC

## 2022-07-02 MED ORDER — SODIUM CHLORIDE 0.9 % IV SOLN
INTRAVENOUS | Status: DC | PRN
  Administered 2022-07-02: 14 mL

## 2022-07-02 MED ORDER — PHENYLEPHRINE HCL (PRESSORS) 10 MG/ML IV SOLN
INTRAVENOUS | Status: AC
  Filled 2022-07-02: qty 1

## 2022-07-02 MED ORDER — PROPOFOL 10 MG/ML IV BOLUS
INTRAVENOUS | Status: DC | PRN
  Administered 2022-07-02: 130 mg via INTRAVENOUS

## 2022-07-02 MED ORDER — DICLOFENAC SUPPOSITORY 100 MG
RECTAL | Status: AC
  Filled 2022-07-02: qty 1

## 2022-07-02 MED ORDER — SUGAMMADEX SODIUM 200 MG/2ML IV SOLN
INTRAVENOUS | Status: DC | PRN
  Administered 2022-07-02: 200 mg via INTRAVENOUS

## 2022-07-02 MED ORDER — IOHEXOL 300 MG/ML  SOLN
75.0000 mL | Freq: Once | INTRAMUSCULAR | Status: AC | PRN
  Administered 2022-07-02: 75 mL via INTRAVENOUS

## 2022-07-02 MED ORDER — OXYCODONE HCL 5 MG PO TABS: 5.0000 mg | ORAL_TABLET | Freq: Once | ORAL | Status: DC | PRN

## 2022-07-02 MED ORDER — POTASSIUM CHLORIDE 10 MEQ/100ML IV SOLN
10.0000 meq | INTRAVENOUS | Status: AC
  Administered 2022-07-02: 10 meq via INTRAVENOUS
  Filled 2022-07-02: qty 100

## 2022-07-02 MED ORDER — ONDANSETRON HCL 4 MG/2ML IJ SOLN
INTRAMUSCULAR | Status: DC | PRN
  Administered 2022-07-02: 4 mg via INTRAVENOUS

## 2022-07-02 MED ORDER — PHENYLEPHRINE 80 MCG/ML (10ML) SYRINGE FOR IV PUSH (FOR BLOOD PRESSURE SUPPORT)
PREFILLED_SYRINGE | INTRAVENOUS | Status: DC | PRN
  Administered 2022-07-02: 240 ug via INTRAVENOUS
  Administered 2022-07-02 (×2): 160 ug via INTRAVENOUS

## 2022-07-02 MED ORDER — PHENYLEPHRINE HCL-NACL 20-0.9 MG/250ML-% IV SOLN
INTRAVENOUS | Status: DC | PRN
  Administered 2022-07-02: 50 ug/min via INTRAVENOUS

## 2022-07-02 MED ORDER — AMISULPRIDE (ANTIEMETIC) 5 MG/2ML IV SOLN: 10.0000 mg | Freq: Once | INTRAVENOUS | Status: DC | PRN

## 2022-07-02 MED ORDER — POTASSIUM CHLORIDE CRYS ER 20 MEQ PO TBCR: 40.0000 meq | EXTENDED_RELEASE_TABLET | Freq: Two times a day (BID) | ORAL | Status: DC

## 2022-07-02 MED ORDER — MAGNESIUM SULFATE 4 GM/100ML IV SOLN
4.0000 g | INTRAVENOUS | Status: AC
Start: 2022-07-02 — End: 2022-07-03
  Administered 2022-07-02: 4 g via INTRAVENOUS
  Filled 2022-07-02: qty 100

## 2022-07-02 MED ORDER — OXYCODONE HCL 5 MG/5ML PO SOLN: 5.0000 mg | Freq: Once | ORAL | Status: DC | PRN

## 2022-07-02 MED ORDER — FENTANYL CITRATE (PF) 100 MCG/2ML IJ SOLN
INTRAMUSCULAR | Status: AC
  Filled 2022-07-02: qty 2

## 2022-07-02 MED ORDER — FOLIC ACID 5 MG/ML IJ SOLN
1.0000 mg | Freq: Every day | INTRAMUSCULAR | Status: DC
  Administered 2022-07-02 – 2022-07-05 (×4): 1 mg via INTRAVENOUS
  Filled 2022-07-02 (×4): qty 0.2

## 2022-07-02 MED ORDER — HYDROMORPHONE HCL 1 MG/ML IJ SOLN: 0.2500 mg | INTRAMUSCULAR | Status: DC | PRN

## 2022-07-02 MED ORDER — GLUCAGON HCL RDNA (DIAGNOSTIC) 1 MG IJ SOLR
INTRAMUSCULAR | Status: AC
  Filled 2022-07-02: qty 2

## 2022-07-02 MED ORDER — HYDROMORPHONE HCL 1 MG/ML IJ SOLN
0.5000 mg | Freq: Once | INTRAMUSCULAR | Status: AC
  Administered 2022-07-02: 0.5 mg via INTRAVENOUS
  Filled 2022-07-02: qty 0.5

## 2022-07-02 MED ORDER — SODIUM CHLORIDE (PF) 0.9 % IJ SOLN
INTRAMUSCULAR | Status: AC
  Filled 2022-07-02: qty 50

## 2022-07-02 MED ORDER — ENSURE ENLIVE PO LIQD
237.0000 mL | Freq: Three times a day (TID) | ORAL | Status: DC
  Administered 2022-07-03 – 2022-07-05 (×7): 237 mL via ORAL

## 2022-07-02 MED ORDER — PROCHLORPERAZINE EDISYLATE 10 MG/2ML IJ SOLN
10.0000 mg | Freq: Four times a day (QID) | INTRAMUSCULAR | Status: DC | PRN
Start: 2022-07-02 — End: 2022-07-05
  Administered 2022-07-02 – 2022-07-03 (×2): 10 mg via INTRAVENOUS
  Filled 2022-07-02 (×2): qty 2

## 2022-07-02 MED ORDER — QUETIAPINE FUMARATE 25 MG PO TABS
25.0000 mg | ORAL_TABLET | Freq: Once | ORAL | Status: AC
  Administered 2022-07-02: 25 mg via ORAL
  Filled 2022-07-02: qty 1

## 2022-07-02 MED ORDER — ROCURONIUM BROMIDE 10 MG/ML (PF) SYRINGE
PREFILLED_SYRINGE | INTRAVENOUS | Status: DC | PRN
  Administered 2022-07-02: 60 mg via INTRAVENOUS

## 2022-07-02 NOTE — Progress Notes (Signed)
Initial Nutrition Assessment  DOCUMENTATION CODES:   Not applicable  INTERVENTION:  - diet advancement as medically feasible.  - will order Ensure Plus High Protein TID for once diet advanced to at least Full Liquids, each supplement provides 350 kcal and 20 grams of protein.  - complete NFPE when feasible.  - will check serum vitamin D on 8/23 AM.   NUTRITION DIAGNOSIS:   Inadequate oral intake related to inability to eat as evidenced by NPO status.  GOAL:   Patient will meet greater than or equal to 90% of their needs  MONITOR:   Diet advancement, Labs, Weight trends  REASON FOR ASSESSMENT:   Malnutrition Screening Tool  ASSESSMENT:   76 y.o. female with medical history of heavy alcohol abuse, chronic pain with history of oxycodone OD, depression, asthma, GERD, HLD, psoriasis, L renal cell carcinoma s/p partial nephrectomy in 2012, arthritis, renal cell carcinoma, and hx of cholecystectomy. She presented to the ED after suspected accidental oxycodone OD and complaining of L hip pain. In the ED, CXR showed no acute concerns, R knee x-ray was negative for fracture of dislocation, x-ray of hips showed bony deformity of the L-sided inferior pubic ramus, and CT head and C-spine were negative for acute concerns. She was admitted due to acute encephalopathy.  Patient has been NPO with a few periods of Clear Liquids since admission. The only documented meal completion percentage was 50% of lunch on 8/20.  Patient was out of the room to Endoscopy earlier today and is now out of the room to Radiology. She is noted to be disoriented x4.  She has not been seen by a [facility] RD since 11/2016.  Weight on 8/19 was 137 lb and PTA the most recently documented weight was 127 lb on 12/19/2020. No information documented in the edema section of flow sheet.  Per notes: - s/p ERCP today - obstructive jaundice - hypodense mass in pancreatic head   Labs reviewed; K: 3.2 mmol/l, BUN: 6  mg/dl, creatinine: 0.43 mg/dl, Ca: 7.6 mg/dl, Mg: 1 mg/dl. LFTs elevated.  Medications reviewed; 1 mg IV folic acid/day, 2 g IV Mg sulfate x1 run 8/22, 1 tablet multivitamin with minerals/day, 100 mg thiamine/day started 8/19.  IVF; LR @ 125 ml/hr.    NUTRITION - FOCUSED PHYSICAL EXAM:  Patient out of the room.  Diet Order:   Diet Order             Diet NPO time specified  Diet effective now                   EDUCATION NEEDS:   Not appropriate for education at this time  Skin:  Skin Assessment: Reviewed RN Assessment  Last BM:  8/21 (type 4 x1, medium amount; type 5 x1, large amount; type 5 x1, small amount)  Height:   Ht Readings from Last 1 Encounters:  06/29/22 '5\' 8"'$  (1.727 m)    Weight:   Wt Readings from Last 1 Encounters:  06/29/22 62.1 kg     BMI:  Body mass index is 20.82 kg/m.  Estimated Nutritional Needs:  Kcal:  1800-2100 kcal Protein:  90-100 grams Fluid:  >/= 2 L/day       Jarome Matin, MS, RD, LDN, CNSC Registered Dietitian II Inpatient Clinical Nutrition RD pager # and on-call/weekend pager # available in [facility]

## 2022-07-02 NOTE — Anesthesia Preprocedure Evaluation (Signed)
Anesthesia Evaluation  Patient identified by MRN, date of birth, ID band Patient awake    Reviewed: Allergy & Precautions, H&P , NPO status , Patient's Chart, lab work & pertinent test results  Airway Mallampati: II  TM Distance: >3 FB Neck ROM: Full    Dental  (+) Partial Upper,    Pulmonary neg shortness of breath, asthma , neg sleep apnea, neg recent URI, Current Smoker and Patient abstained from smoking.,    breath sounds clear to auscultation       Cardiovascular negative cardio ROS   Rhythm:Regular     Neuro/Psych neg Seizures PSYCHIATRIC DISORDERS Depression Low back pain with right leg symptoms  Neuromuscular disease negative psych ROS   GI/Hepatic GERD  Medicated and Controlled,(+) Cirrhosis     substance abuse  alcohol use,   Endo/Other  Hypercholesterolemia  Renal/GU Renal diseasenegative Renal ROSHx/o Left renal cell Ca     Musculoskeletal  (+) Arthritis , Osteoarthritis,  Chronic LBP Psoriasis   Abdominal   Peds  Hematology  (+) Blood dyscrasia, anemia ,   Anesthesia Other Findings Pancreatic Mass  Reproductive/Obstetrics                             Lab Results  Component Value Date   WBC 11.4 (H) 07/02/2022   HGB 9.4 (L) 07/02/2022   HCT 27.5 (L) 07/02/2022   MCV 71.2 (L) 07/02/2022   PLT 365 07/02/2022   Lab Results  Component Value Date   CREATININE 0.43 (L) 07/02/2022   BUN 6 (L) 07/02/2022   NA 137 07/02/2022   K 3.2 (L) 07/02/2022   CL 102 07/02/2022   CO2 24 07/02/2022    Anesthesia Physical  Anesthesia Plan  ASA: 3  Anesthesia Plan: General   Post-op Pain Management:    Induction: Intravenous  PONV Risk Score and Plan: 2 and Ondansetron, Midazolam and Treatment may vary due to age or medical condition  Airway Management Planned: Oral ETT  Additional Equipment: None  Intra-op Plan:   Post-operative Plan: Extubation in OR  Informed  Consent: I have reviewed the patients History and Physical, chart, labs and discussed the procedure including the risks, benefits and alternatives for the proposed anesthesia with the patient or authorized representative who has indicated his/her understanding and acceptance.     Dental advisory given  Plan Discussed with: CRNA, Surgeon and Anesthesiologist  Anesthesia Plan Comments:         Anesthesia Quick Evaluation

## 2022-07-02 NOTE — Interval H&P Note (Signed)
History and Physical Interval Note:  07/02/2022 12:58 PM  [name]  has presented today for surgery, with the diagnosis of Pancreatic mass and biliary obstruction.  The various methods of treatment have been discussed with the patient and family. After consideration of risks, benefits and other options for treatment, the patient has consented to  Procedure(s): ENDOSCOPIC RETROGRADE CHOLANGIOPANCREATOGRAPHY (ERCP) (N/A) UPPER ENDOSCOPIC ULTRASOUND (EUS) LINEAR (N/A) as a surgical intervention.  The patient's history has been reviewed, patient examined, no change in status, stable for surgery.  I have reviewed the patient's chart and labs.  Questions were answered to the patient's satisfaction.     Wallis Spizzirri D

## 2022-07-02 NOTE — Transfer of Care (Signed)
Immediate Anesthesia Transfer of Care Note  Patient: [name]  Procedure(s) Performed: ENDOSCOPIC RETROGRADE CHOLANGIOPANCREATOGRAPHY (ERCP) UPPER ENDOSCOPIC ULTRASOUND (EUS) LINEAR SPHINCTEROTOMY BILIARY STENT PLACEMENT  Patient Location: PACU  Anesthesia Type:General  Level of Consciousness: sedated  Airway & Oxygen Therapy: Patient Spontanous Breathing and Patient connected to face mask oxygen  Post-op Assessment: Report given to RN and Post -op Vital signs reviewed and stable  Post vital signs: Reviewed and stable  Last Vitals:  Vitals Value Taken Time  BP    Temp    Pulse    Resp 15 07/02/22 1449  SpO2    Vitals shown include unvalidated device data.  Last Pain:  Vitals:   07/02/22 1220  TempSrc: Temporal  PainSc: 0-No pain      Patients Stated Pain Goal: 4 (57/84/69 6295)  Complications: No notable events documented.

## 2022-07-02 NOTE — Progress Notes (Signed)
[facility]  PROGRESS NOTE  [name] NLG:[id number] DOB: 27-Sep-1946 DOA: 06/28/2022 PCP: Lois Huxley, PA   Brief HPI:    76 y.o. female with medical history significant of alcohol abuse, chronic pain with history of oxycodone overdose in the past, depression, asthma, GERD, hyperlipidemia, psoriasis, left renal cell carcinoma status post partial nephrectomy in 2012, history of cholecystectomy presents to the ED from home after daughter suspected accidental oxycodone overdose.  She was found on the ground beside her bed and was not able to stand or ambulate.  Complained of left hip pain.  Per EMS, medication counts were normal.  Pupils equal and reactive.  Patient was lethargic on initial evaluation done in the ED but later became more awake and alert.  Vital signs stable.  No Narcan given.  Daughter told ED physician that patient has been abusing oxycodone for years and also drinks alcohol heavily.  Labs showing WBC 15.1, hemoglobin 10.0 with MCV 73.8 (hemoglobin previously normal 3 years ago), platelet count 426k (chronic mild elevation), sodium 131, potassium 2.2, chloride 86, creatinine 1.2 (baseline 0.6-0.8), calcium 8.5, albumin 2.9, AST 186, ALT 74, alkaline phosphatase 512, T. bili 8.2, lipase normal, INR 1.4, magnesium normal, CK 1903, blood ethanol level undetectable, acetaminophen level undetectable, salicylate level undetectable, VBG with pH 7.49 and PCO2 53, ammonia 59, lactic acid normal.  Blood cultures drawn.  UA and urine culture pending.  Chest x-ray showing no active disease.  X-ray of right knee negative for fracture or dislocation.  X-ray of hips showing bony deformity of the inferior pubic ramus on the left which is new from 2020, suggesting old healed fracture.  Also showing old healed fracture of the distal left femur with hardware in place.  CT head and C-spine negative for acute finding.   Patient had abdominal tenderness on exam.  CT abdomen pelvis with contrast  showing: "IMPRESSION: 1. Hypodense masslike region in the pancreatic head with surrounding fat stranding measuring 6.5 x 4.8 x 6.5 cm resulting in biliary and pancreatic duct ductal dilatation. Findings are concerning for pancreatic adenocarcinoma versus pancreatitis with developing abscess. Correlation with amylase and lipase and CA 19-9 is recommended. MRI with contrast is suggested for further evaluation. 2. 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 3. Morphologic changes of cirrhosis in the liver. 4. Mild-to-moderate hydroureteronephrosis on the right with ureteral enhancement. A 3 mm calculus is present in the anticipated region of the distal right ureter, possible ureteral calculus or phlebolith. Clinical correlation is recommended to exclude superimposed infection. 5. Aortic atherosclerosis."     Subjective   Patient seen and examined, somnolent but arousable.  Magnesium was found to be very low 1.0, this morning given 4 g of magnesium sulfate IV x1   Assessment/Plan:    Acute metabolic encephalopathy -Resolved -Likely in setting of polypharmacy; patient was on oxycodone, Flexeril, trazodone, Ativan, gabapentin -Patient was somnolent however able to answer my questions. -Avoid sedating medications     Pancreatic mass seen on CT abdomen/pelvis -CT showing a hypodense masslike region in the pancreatic head with surrounding fat stranding measuring 6.5 x 4.8 x 6.5 cm resulting in biliary and pancreatic duct ductal dilatation. Findings are concerning for pancreatic adenocarcinoma versus pancreatitis with developing abscess -MRCP abdomen obtained confirms pancreatic mass -Gastroenterology consulted, likely patient will need EUS and ERCP -Plan for EUS/ERCP today -Medical oncology also consulted, they are following patient   Nonocclusive SMV thrombus -Discussed with oncology, will hold anticoagulation  at this time; till  patient gets endoscopic ultrasound/ERCP and biopsy -Will eventually need to be started on anticoagulation -Stool for occult blood is positive -We will discuss with oncology regarding anticoagulation  Rhabdomyolysis -CK was elevated to 3,101 -Continue LR at 125 mill per hour -CK level down to 1378 -Follow CK level in a.m.   Obstructive uropathy/possible infected kidney stone -CT showed mild to moderate hydroureteronephrosis on the right with ureteral enhancement -A 3 mm calculus present within the anticipated region of the distal right ureter, possible ureteral calculus or phlebolith -UA with positive nitrite -Started on ceftriaxone, will change antibiotics to vancomycin, cefepime and Flagyl -Patient's clinical condition has improved, antibiotics changed to ceftriaxone -Urology consulted; patient hydronephrosis resolved and the bladder was empty, it was not called due to ureteral calculus and calcification on CT is likely phleboliths as per urology. -Urology has signed off   Alcohol abuse -CIWA protocol   Hypokalemia -Potassium is 3.2 this morning -Replace potassium and follow BMP in am   Medications     feeding supplement  237 mL Oral TID BM   [MAR Hold] folic acid  1 mg Intravenous Daily   [MAR Hold] multivitamin with minerals  1 tablet Oral Daily   sodium chloride (PF)       [MAR Hold] thiamine  100 mg Oral Daily   Or   [MAR Hold] thiamine  100 mg Intravenous Daily     Data Reviewed:   CBG:  Recent Labs  Lab 06/29/22 0031  GLUCAP 102*    SpO2: 100 %    Vitals:   07/01/22 2127 07/02/22 0805 07/02/22 1012 07/02/22 1220  BP: 129/72 (!) 94/51 102/65 103/74  Pulse: 94 91 85 88  Resp: '17 16  18  '$ Temp: 98.5 F (36.9 C) 98.4 F (36.9 C)  98.4 F (36.9 C)  TempSrc:  Oral  Temporal  SpO2: 100% 100%  100%  Weight:      Height:          Data Reviewed:  Basic Metabolic Panel: Recent Labs  Lab 06/29/22 0032 06/29/22 0705 06/30/22 0348 07/01/22 0342  07/02/22 0421  NA 131* 134* 135 137 137  K 2.2* 2.1* 2.6* 2.9* 3.2*  CL 86* 91* 98 103 102  CO2 '29 30 28 24 24  '$ GLUCOSE 95 90 86 89 91  BUN '21 20 12 8 '$ 6*  CREATININE 1.20* 1.05* 0.56 0.46 0.43*  CALCIUM 8.5* 8.1* 7.9* 7.9* 7.6*  MG 2.0  --   --   --  1.0*    CBC: Recent Labs  Lab 06/29/22 0049 06/29/22 0705 06/30/22 0348 07/02/22 0421  WBC 15.1* 15.0* 12.3* 11.4*  NEUTROABS 12.0*  --   --   --   HGB 10.0* 8.8* 8.6* 9.4*  HCT 29.6* 27.0* 25.2* 27.5*  MCV 73.8* 75.0* 72.0* 71.2*  PLT 426* 421* 401* 365    LFT Recent Labs  Lab 06/29/22 0032 06/29/22 0705 06/30/22 0348 07/01/22 0342  AST 186* 212* 202* 192*  ALT 74* 73* 70* 75*  ALKPHOS 512* 481* 421* 380*  BILITOT 8.2* 7.6* 8.3* 9.8*  PROT 7.1 6.3* 5.7* 5.6*  ALBUMIN 2.9* 2.7* 2.3* 2.2*     Antibiotics: Anti-infectives (From admission, onward)    Start     Dose/Rate Route Frequency Ordered Stop   07/01/22 1700  [MAR Hold]  cefTRIAXone (ROCEPHIN) 2 g in sodium chloride 0.9 % 100 mL IVPB        (MAR Hold since Tue 07/02/2022 at 1211.Hold Reason:  Transfer to a Procedural area)   2 g 200 mL/hr over 30 Minutes Intravenous Every 24 hours 07/01/22 1341     06/30/22 1659  ceFEPIme (MAXIPIME) 2 g in sodium chloride 0.9 % 100 mL IVPB  Status:  Discontinued        2 g 200 mL/hr over 30 Minutes Intravenous Every 8 hours 06/30/22 1132 07/01/22 1341   06/30/22 1600  vancomycin (VANCOCIN) IVPB 1000 mg/200 mL premix  Status:  Discontinued        1,000 mg 200 mL/hr over 60 Minutes Intravenous Every 24 hours 06/29/22 1551 06/30/22 1132   06/30/22 1600  vancomycin (VANCOREADY) IVPB 1250 mg/250 mL  Status:  Discontinued        1,250 mg 166.7 mL/hr over 90 Minutes Intravenous Every 24 hours 06/30/22 1132 07/01/22 1341   06/29/22 1645  ceFEPIme (MAXIPIME) 2 g in sodium chloride 0.9 % 100 mL IVPB  Status:  Discontinued        2 g 200 mL/hr over 30 Minutes Intravenous Every 12 hours 06/29/22 1551 06/30/22 1132   06/29/22 1645   vancomycin (VANCOREADY) IVPB 1250 mg/250 mL        1,250 mg 166.7 mL/hr over 90 Minutes Intravenous  Once 06/29/22 1551 06/29/22 1908   06/29/22 1645  metroNIDAZOLE (FLAGYL) IVPB 500 mg  Status:  Discontinued        500 mg 100 mL/hr over 60 Minutes Intravenous Every 12 hours 06/29/22 1551 07/01/22 1341   06/29/22 0730  cefTRIAXone (ROCEPHIN) 1 g in sodium chloride 0.9 % 100 mL IVPB  Status:  Discontinued        1 g 200 mL/hr over 30 Minutes Intravenous Every 24 hours 06/29/22 0717 06/29/22 1539        DVT prophylaxis: SCDs  Code Status: Full code  Family Communication: No family at bedside   CONSULTS gastroenterology   Objective    Physical Examination:  General-appears in no acute distress Heart-S1-S2, regular, no murmur auscultated Lungs-clear to auscultation bilaterally, no wheezing or crackles auscultated Abdomen-soft, nontender, no organomegaly Extremities-no edema in the lower extremities Neuro-alert, oriented x3, no focal deficit noted  Status is: Inpatient:             Oswald Hillock   [facility] If 7PM-7AM, please contact night-coverage at [email or web address], Office  [phone]   07/02/2022, 2:38 PM  LOS: 3 days

## 2022-07-02 NOTE — TOC Progression Note (Signed)
Transition of Care [facility]) - Progression Note    Patient Details  Name: [name] MRN: [id number] Date of Birth: December 29, 1945  Transition of Care [facility]) CM/SW [place], RN Phone Number: 07/02/2022, 3:04 PM  Clinical Narrative:  Unable to speak with patient re: substance abuse resources, currently at [place] radiology.  TOC will continue to follow.     Expected Discharge Plan: Home/Self Care Barriers to Discharge: Active Substance Use - Placement  Expected Discharge Plan and Services Expected Discharge Plan: Home/Self Care In-house Referral: NA Discharge Planning Services: CM Consult   Living arrangements for the past 2 months: Single Family Home                         Representative spoke with at DME Agency: N/A         Representative spoke with at [place]: N/A   Social Determinants of Health ([place]) Interventions    Readmission Risk Interventions     No data to display

## 2022-07-02 NOTE — Anesthesia Procedure Notes (Signed)
Procedure Name: Intubation Date/Time: 07/02/2022 1:25 PM  Performed by: Talbot Grumbling, CRNAPre-anesthesia Checklist: Patient identified, Emergency Drugs available, Patient being monitored and Suction available Patient Re-evaluated:Patient Re-evaluated prior to induction Oxygen Delivery Method: Circle system utilized Preoxygenation: Pre-oxygenation with 100% oxygen Induction Type: IV induction Ventilation: Mask ventilation without difficulty Laryngoscope Size: Mac and 3 Grade View: Grade I Tube type: Oral Tube size: 7.0 mm Number of attempts: 1 Airway Equipment and Method: Stylet Placement Confirmation: ETT inserted through vocal cords under direct vision, positive ETCO2 and breath sounds checked- equal and bilateral Secured at: 21 cm Tube secured with: Tape Dental Injury: Teeth and Oropharynx as per pre-operative assessment

## 2022-07-02 NOTE — Op Note (Signed)
[facility] Patient Name: [name] Procedure Date: 07/02/2022 MRN: [id number] Attending MD: Carol Ada , MD Date of Birth: December 19, 1945 CSN: [id number] Age: 76 Admit Type: Inpatient Procedure:                ERCP Indications:              Malignant stricture of the common bile duct Providers:                Carol Ada, MD, Allayne Gitelman, RN, Jaci Carrel,                            RN, Cletis [place], Technician Referring MD:              Medicines:                General Anesthesia Complications:            No immediate complications. Estimated Blood Loss:     Estimated blood loss: none. Procedure:                Pre-Anesthesia Assessment:                           - Prior to the procedure, a History and Physical                            was performed, and patient medications and                            allergies were reviewed. The patient's tolerance of                            previous anesthesia was also reviewed. The risks                            and benefits of the procedure and the sedation                            options and risks were discussed with the patient.                            All questions were answered, and informed consent                            was obtained. Prior Anticoagulants: The patient has                            taken no previous anticoagulant or antiplatelet                            agents. ASA Grade Assessment: III - A patient with                            severe systemic disease. After reviewing the risks  and benefits, the patient was deemed in                            satisfactory condition to undergo the procedure.                           - Sedation was administered by an anesthesia                            professional. General anesthesia was attained.                           After obtaining informed consent, the scope was                            passed under direct  vision. Throughout the                            procedure, the patient's blood pressure, pulse, and                            oxygen saturations were monitored continuously. The                            [id number]-[id number] ([id number]) Olympus duodenoscope was                            introduced through the mouth, and used to inject                            contrast into and used to inject contrast into the                            bile duct. The ERCP was accomplished without                            difficulty. The patient tolerated the procedure                            well. Scope In: Scope Out: Findings:      The major papilla was normal. The bile duct was deeply cannulated with       the short-nosed traction sphincterotome. Contrast was injected. I       personally interpreted the bile duct images. There was brisk flow of       contrast through the ducts. Image quality was excellent. Contrast       extended to the bifurcation. The common bile duct contained a single       segmental stenosis 50 mm in length. A short 0.035 inch Soft Jagwire was       passed into the biliary tree. A 10 mm biliary sphincterotomy was made       with a monofilament traction (standard) sphincterotome using ERBE       electrocautery. The sphincterotomy oozed blood. One 10 mm by 6 cm       covered metal stent was  placed 5 cm into the common bile duct. Clear       fluid flowed through the stent. The stent was in good position.      Cannulation of the CBD was achieved during the first attempt. The       guidewire was secured in the right intrahepatic ducts. Contrast       injection showed that the CBD was markedly dilated at 17 mm and this       extended up to the bifurcations. A stricture was found in the distal CBD       measuring 5 cm. A 10 mm x 60 mm covered metallic stent was placed with       ease. Fluoroscopy confirmed the position of the stent. Impression:               - The major papilla appeared  normal.                           - A single segmental biliary stricture was found in                            the common bile duct. The stricture was malignant                            appearing.                           - A biliary sphincterotomy was performed.                           - One covered metal stent was placed into the                            common bile duct. Moderate Sedation:      Not Applicable - Patient had care per Anesthesia. Recommendation:           - Return patient to hospital ward for ongoing care.                           - Follow liver enzymes.                           Oncology consultation. Procedure Code(s):        --- Professional ---                           [phone], Endoscopic retrograde                            cholangiopancreatography (ERCP); with placement of                            endoscopic stent into biliary or pancreatic duct,                            including pre- and post-dilation and guide wire  passage, when performed, including sphincterotomy,                            when performed, each stent                           [phone], Endoscopic catheterization of the biliary                            ductal system, radiological supervision and                            interpretation Diagnosis Code(s):        --- Professional ---                           K83.1, Obstruction of bile duct CPT copyright 2019 American Medical Association. All rights reserved. The codes documented in this report are preliminary and upon coder review may  be revised to meet current compliance requirements. Carol Ada, MD Carol Ada, MD 07/02/2022 3:02:07 PM This report has been signed electronically. Number of Addenda: 0

## 2022-07-02 NOTE — Anesthesia Postprocedure Evaluation (Signed)
Anesthesia Post Note  Patient: Jenesa P Roeper  Procedure(s) Performed: ENDOSCOPIC RETROGRADE CHOLANGIOPANCREATOGRAPHY (ERCP) UPPER ENDOSCOPIC ULTRASOUND (EUS) LINEAR SPHINCTEROTOMY BILIARY STENT PLACEMENT ESOPHAGOGASTRODUODENOSCOPY (EGD) FINE NEEDLE ASPIRATION (FNA) LINEAR     Patient location during evaluation: PACU Anesthesia Type: General Level of consciousness: awake and alert Pain management: pain level controlled Vital Signs Assessment: post-procedure vital signs reviewed and stable Respiratory status: spontaneous breathing, nonlabored ventilation and respiratory function stable Cardiovascular status: blood pressure returned to baseline and stable Postop Assessment: no apparent nausea or vomiting Anesthetic complications: no   No notable events documented.  Last Vitals:  Vitals:   07/02/22 1530 07/02/22 1555  BP: (!) 97/50 103/65  Pulse: 93 92  Resp: (!) 29 18  Temp:  36.6 C  SpO2: (!) 89% 100%    Last Pain:  Vitals:   07/02/22 1555  TempSrc: Oral  PainSc:                  Lynda Rainwater

## 2022-07-02 NOTE — Op Note (Signed)
[facility] Patient Name: [name] Procedure Date: 07/02/2022 MRN: [id number] Attending MD: Carol Ada , MD Date of Birth: [birth year] CSN: [id number] Age: 76 Admit Type: Inpatient Procedure:                Upper EUS Indications:              For evaluation of pancreatic adenocarcinoma Providers:                Carol Ada, MD, Allayne Gitelman, RN, Jaci Carrel,                            RN, Cletis [place], Technician Referring MD:              Medicines:                General Anesthesia Complications:            No immediate complications. Estimated Blood Loss:     Estimated blood loss: none. Procedure:                Pre-Anesthesia Assessment:                           - Prior to the procedure, a History and Physical                            was performed, and patient medications and                            allergies were reviewed. The patient's tolerance of                            previous anesthesia was also reviewed. The risks                            and benefits of the procedure and the sedation                            options and risks were discussed with the patient.                            All questions were answered, and informed consent                            was obtained. Prior Anticoagulants: The patient has                            taken no previous anticoagulant or antiplatelet                            agents. ASA Grade Assessment: III - A patient with                            severe systemic disease. After reviewing the risks  and benefits, the patient was deemed in                            satisfactory condition to undergo the procedure.                           - Sedation was administered by an anesthesia                            professional. General anesthesia was attained.                           After obtaining informed consent, the endoscope was                            passed under  direct vision. Throughout the                            procedure, the patient's blood pressure, pulse, and                            oxygen saturations were monitored continuously. The                            [id number] ([id number] Olympus linear ultrasound scope                            was introduced through the mouth, and advanced to                            the second part of duodenum. The upper EUS was                            accomplished without difficulty. The patient                            tolerated the procedure well. Scope In: Scope Out: Findings:      ENDOSONOGRAPHIC FINDING: :      An irregular mass was identified in the pancreatic head. The mass was       hypoechoic. The mass measured 56 mm by 43 mm in maximal cross-sectional       diameter. The outer margins were irregular. An intact interface was seen       between the mass and the second portion of duodenum suggesting a lack of       invasion. The remainder of the pancreas was examined. The       endosonographic appearance of parenchyma and the upstream pancreatic       duct indicated duct dilation and a maximum duct diameter of 3 mm. Fine       needle aspiration for cytology was performed. Color Doppler imaging was       utilized prior to needle puncture to confirm a lack of significant       vascular structures within the needle path. Three passes were made with       the 22 gauge needle using a  transduodenal approach. A stylet was used. A       cytotechnologist was present to evaluate the adequacy of the specimen.       The cellularity of the specimen was adequate. Final cytology results are       pending.      There was dilation in the common bile duct which measured up to 17 mm.      In the head of the pancreas a very large mass was identified. The       largest measurements were difficult to obtain, but a measurment did show       that it was 56 mm x 43 mm. Three passes with the 22 gauge FNA needle        were performed and the preliminary was consistent with an       adenocarcinoma. Perihepatic ascites was identified. The CBD was dilated       up to 17 mm in the proximal CBD as well as cystic duct dilation and       intrahepatic biliary ductal dilation. Portions of the liver edge       appeared irregular. Gross invasion of the mass into the second portion       of the duodenum was visualized. Impression:               - A mass was identified in the pancreatic head.                            Tissue was obtained from this exam. The preliminary                            diagnosis is consistent with adenocarcinoma. Fine                            needle aspiration performed.                           - There was dilation in the common bile duct which                            measured up to 17 mm. Moderate Sedation:      Not Applicable - Patient had care per Anesthesia. Recommendation:           - Proceed with the ERCP. Procedure Code(s):        --- Professional ---                           [id number], Esophagogastroduodenoscopy, flexible,                            transoral; with transendoscopic ultrasound-guided                            intramural or transmural fine needle                            aspiration/biopsy(s), (includes endoscopic  ultrasound examination limited to the esophagus,                            stomach or duodenum, and adjacent structures) Diagnosis Code(s):        --- Professional ---                           K86.89, Other specified diseases of pancreas                           C25.9, Malignant neoplasm of pancreas, unspecified                           K83.8, Other specified diseases of biliary tract CPT copyright 2019 American Medical Association. All rights reserved. The codes documented in this report are preliminary and upon coder review may  be revised to meet current compliance requirements. Carol Ada, MD Carol Ada,  MD 07/02/2022 2:55:24 PM This report has been signed electronically. Number of Addenda: 0

## 2022-07-02 NOTE — Consult Note (Addendum)
[place]  [phone] [phone]   MEDICAL ONCOLOGY - INITIAL CONSULTATION  Referral MD: Dr. Eleonore Chiquito  Reason for Referral: Pancreatic mass  HPI: Ms. Speas is a 76 year old female with a past medical history significant for alcohol abuse, chronic pain with history of oxycodone overdose in the past, depression, asthma, GERD, hyperlipidemia, psoriasis, left renal cell carcinoma status post partial nephrectomy in 2012.  She presented to the emergency department with altered mental status.  Her daughter had suspected accidental oxycodone overdose.  The patient was found on the ground beside her bed and was not able to stand or ambulate.  She reported left hip pain.  Daughter reportedly told ED staff that she has been abusing oxycodone for many years and also drinks alcohol heavily.  On admission, her WBC was 15.1, hemoglobin 10.0, MCV 73.8, platelets 426,000, sodium 131, potassium 2.2, creatinine 1.2, calcium 8.5, albumin 2.9, AST 186, ALT 74, alk phos 512, T. bili 8.2.  Ethanol level was undetectable.  PT 16.7, INR 1.4 ammonia level 59.  Ferritin was 9, iron 23, percent saturation 7%.  CT abdomen/pelvis with contrast performed on admission showed a hypodense masslike region in the pancreatic head with surrounding fat stranding measuring 6.5 x 4.8 x 6.5 cm resulting in biliary and pancreatic ductal dilatation, nonocclusive thrombus in the superior mesenteric vein at the level of the pancreatic lesion, morphologic changes of cirrhosis of the liver, mild to moderate hydroureteronephrosis on the right with ureteral enhancement with a 3 mm calculus.  MRCP was also performed which showed a 6.5 x 5.8 x 4.9 cm rim-enhancing mass in the anterior pancreatic head, imaging features suggest direct invasion of the medial wall of the descending duodenum, nonocclusive thrombus in the superior mesenteric vein near the portal splenic confluence, intrahepatic and extrahepatic biliary duct  dilatation with abrupt cut off of the common bile duct just proximal to the ampulla with associated mild diffuse dilatation of the main pancreatic duct, mass lesion compresses the IVC posteriorly against the spine and IVC involvement not excluded, no evidence of hepatic metastases or metastatic lymphadenopathy in the abdomen.  A CA 19.9 was obtained and was normal at 19.  Stool for occult blood positive.  The patient has been seen by GI who are planning for ERCP/EUS.  Additionally, the patient is on the CIWA protocol for alcohol and polysubstance abuse.  The patient was seen in her hospital room.  No family at the bedside.  She is sedated secondary to medications.  I was unable to awaken her.  History was obtained through chart review.  Medical oncology was asked to see the patient make recommendations regarding her pancreatic mass.  Past Medical History:  Diagnosis Date   Alcohol abuse    Alcohol abuse    Allergy    Arthritis    back-severe, hips, right knee   Asthma    GERD (gastroesophageal reflux disease)    occasional   H/O measles    H/O mumps    Hypercholesteremia    under control   Insomnia    Psoriasis (a type of skin inflammation)    Renal cell carcinoma 2012   left   Seasonal allergies   :   Past Surgical History:  Procedure Laterality Date   ABDOMINAL HYSTERECTOMY  40years ago   BUNIONECTOMY  04/2011   CHOLECYSTECTOMY  11/13/2011   Procedure: LAPAROSCOPIC CHOLECYSTECTOMY WITH INTRAOPERATIVE CHOLANGIOGRAM;  Surgeon: Judieth Keens, DO;  Location: [facility];  Service: General;  Laterality: N/A;  COLONOSCOPY N/A 09/25/2013   Procedure: COLONOSCOPY;  Surgeon: Lear Ng, MD;  Location: [facility];  Service: Endoscopy;  Laterality: N/A;   ESOPHAGOGASTRODUODENOSCOPY N/A 09/25/2013   Procedure: ESOPHAGOGASTRODUODENOSCOPY (EGD);  Surgeon: Lear Ng, MD;  Location: [facility];  Service: Endoscopy;  Laterality: N/A;   ESOPHAGOGASTRODUODENOSCOPY N/A  04/07/2016   Procedure: ESOPHAGOGASTRODUODENOSCOPY (EGD);  Surgeon: Milus Banister, MD;  Location: [facility];  Service: Endoscopy;  Laterality: N/A;   ESOPHAGOGASTRODUODENOSCOPY N/A 10/26/2016   Procedure: ESOPHAGOGASTRODUODENOSCOPY (EGD);  Surgeon: Arta Silence, MD;  Location: [facility];  Service: Endoscopy;  Laterality: N/A;   ESOPHAGOGASTRODUODENOSCOPY N/A 12/07/2016   Procedure: ESOPHAGOGASTRODUODENOSCOPY (EGD);  Surgeon: Wonda Horner, MD;  Location: [facility];  Service: Endoscopy;  Laterality: N/A;   ESOPHAGOGASTRODUODENOSCOPY (EGD) WITH PROPOFOL Left 01/03/2016   Procedure: ESOPHAGOGASTRODUODENOSCOPY (EGD) WITH PROPOFOL;  Surgeon: Arta Silence, MD;  Location: [facility];  Service: Endoscopy;  Laterality: Left;   FEMUR IM NAIL Left 04/15/2016   Procedure: INTRAMEDULLARY (IM) RETROGRADE FEMORAL NAILING;  Surgeon: Gaynelle Arabian, MD;  Location: [facility];  Service: Orthopedics;  Laterality: Left;   HERNIA REPAIR  06/3381   supraumbilical repair   KIDNEY SURGERY  12/2010   [facility]; partial nephrectomy   LUMBAR LAMINECTOMY/DECOMPRESSION MICRODISCECTOMY Right 10/12/2014   Procedure: HEMI LAMINECTOMY MICRODISCECTOMY L5-S1 RIGHT (1 LEVEL);  Surgeon: Tobi Bastos, MD;  Location: [facility];  Service: Orthopedics;  Laterality: Right;   MENISECTOMY  2010   left knee   TUBAL LIGATION  44 years ago  :   Current Facility-Administered Medications  Medication Dose Route Frequency Provider Last Rate Last Admin   cefTRIAXone (ROCEPHIN) 2 g in sodium chloride 0.9 % 100 mL IVPB  2 g Intravenous Q24H Oswald Hillock, MD 200 mL/hr at 07/01/22 1903 2 g at 50/53/97 6734   folic acid (FOLVITE) tablet 1 mg  1 mg Oral Daily Shela Leff, MD   1 mg at 07/01/22 1937   lactated ringers infusion   Intravenous Continuous Shela Leff, MD 125 mL/hr at 07/01/22 0305 New Bag at 07/01/22 0305   magnesium sulfate IVPB 4 g 100 mL  4 g Intravenous STAT Oswald Hillock, MD       multivitamin with minerals  tablet 1 tablet  1 tablet Oral Daily Shela Leff, MD   1 tablet at 07/01/22 9024   oxyCODONE (Oxy IR/ROXICODONE) immediate release tablet 5 mg  5 mg Oral Q4H PRN Oswald Hillock, MD   5 mg at 07/01/22 2341   potassium chloride 10 mEq in 100 mL IVPB  10 mEq Intravenous Q1 Hr x 6 Lama, Gagan S, MD       prochlorperazine (COMPAZINE) injection 10 mg  10 mg Intravenous Q6H PRN Lang Snow, NP   10 mg at 07/02/22 0973   thiamine (VITAMIN B1) tablet 100 mg  100 mg Oral Daily Shela Leff, MD   100 mg at 07/01/22 5329   Or   thiamine (VITAMIN B1) injection 100 mg  100 mg Intravenous Daily Shela Leff, MD          Allergies  Allergen Reactions   Azithromycin Itching and Swelling   Morphine And Related Itching  :   Family History  Problem Relation Age of Onset   Hyperlipidemia Mother    Hypertension Mother   :   Social History   Socioeconomic History   Marital status: Divorced    Spouse name: Not on file   Number of children: 3   Years of education: 44  Highest education level: Not on file  Occupational History   Occupation: Retired  Tobacco Use   Smoking status: Some Days    Packs/day: 0.15    Years: 6.00    Total pack years: 0.90    Types: Cigarettes   Smokeless tobacco: Never  Vaping Use   Vaping Use: Never used  Substance and Sexual Activity   Alcohol use: Yes    Alcohol/week: 2.0 standard drinks of alcohol    Types: 1 Glasses of wine, 1 Shots of liquor per week    Comment: 2-3 glasses of wine a day   Drug use: No   Sexual activity: Yes  Other Topics Concern   Not on file  Social History Narrative   Fun: play cards, play with her grandchildren, cooking   Denies religious beliefs effecting health care.    Social Determinants of Health   Financial Resource Strain: Not on file  Food Insecurity: Not on file  Transportation Needs: Not on file  Physical Activity: Not on file  Stress: Not on file  Social Connections: Not on file   Intimate Partner Violence: Not on file  :  Review of Systems: Unable to obtain secondary to sedation.  Exam: Patient Vitals for the past 24 hrs:  BP Temp Temp src Pulse Resp SpO2  07/02/22 0805 (!) 94/51 98.4 F (36.9 C) Oral 91 16 100 %  07/01/22 2127 129/72 98.5 F (36.9 C) -- 94 17 100 %  07/01/22 1227 109/65 98.4 F (36.9 C) Oral 91 18 100 %    General: Laying in bed, resting, no distress. Eyes: Scleral icterus present. ENT:  There were no oropharyngeal lesions.     Lymphatics:  Negative cervical, supraclavicular or axillary adenopathy.   Respiratory: lungs were clear bilaterally without wheezing or crackles.   Cardiovascular:  Regular rate and rhythm.  There was no pedal edema.   GI: Positive bowel sounds, soft.  Skin exam was without echymosis, petichae.   Neuro: Sedated secondary to medication.   Lab Results  Component Value Date   WBC 11.4 (H) 07/02/2022   HGB 9.4 (L) 07/02/2022   HCT 27.5 (L) 07/02/2022   PLT 365 07/02/2022   GLUCOSE 91 07/02/2022   CHOL 141 01/03/2016   TRIG 111 01/03/2016   HDL 47 01/03/2016   LDLDIRECT 146.1 04/27/2007   LDLCALC 72 01/03/2016   ALT 75 (H) 07/01/2022   AST 192 (H) 07/01/2022   NA 137 07/02/2022   K 3.2 (L) 07/02/2022   CL 102 07/02/2022   CREATININE 0.43 (L) 07/02/2022   BUN 6 (L) 07/02/2022   CO2 24 07/02/2022    MR ABDOMEN MRCP W WO CONTAST  Result Date: 06/29/2022 CLINICAL DATA:  Pancreatic head mass on CT scan earlier same day. EXAM: MRI ABDOMEN WITHOUT AND WITH CONTRAST (INCLUDING MRCP) TECHNIQUE: Multiplanar multisequence MR imaging of the abdomen was performed both before and after the administration of intravenous contrast. Heavily T2-weighted images of the biliary and pancreatic ducts were obtained, and three-dimensional MRCP images were rendered by post processing. CONTRAST:  33m GADAVIST GADOBUTROL 1 MMOL/ML IV SOLN COMPARISON:  CT scan abdomen/pelvis 06/29/2022 FINDINGS: Lower chest: Unremarkable.  Hepatobiliary: No suspicious focal abnormality within the liver parenchyma. Gallbladder surgically absent. Mild intrahepatic biliary duct dilatation is associated with 16 mm diameter common duct in the porta hepatis and dilatation of the cystic duct and common bile duct abruptly tapering just proximal to the ampulla. Pancreas: 6.5 x 5.8 x 4.9 cm rim enhancing ill-defined mass is identified  in the anterior pancreatic head. Mild diffuse dilatation of the main pancreatic duct although mass lesion is anterior to the common bile duct and main pancreatic duct. Lesion probably abuts the inferior aspect portal vein and coronal imaging shows lateral margin of the lesion indistinguishable from the medial wall of the descending duodenum (see coronal 16/12). 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 Spleen: No splenomegaly. No focal mass lesion. Adrenals/Urinary Tract: No adrenal nodule or mass. Kidneys unremarkable. Stomach/Bowel: Stomach is nondistended. Although assessment is motion degraded, the anterior pancreatic head mass is contiguous with the posterior wall the duodenum and medial wall of the descending duodenum. Coronal T2 imaging is highly suspicious for tumor invasion of the medial wall of the descending duodenum (see axial T2 image 30 of series 2 and axial T2 haste image 29 series 16. This region is not well evaluated on postcontrast imaging due to motion artifact but appearance is similar suggesting duodenal invasion. Vascular/Lymphatic: Other:  Trace free fluid noted around the liver. Musculoskeletal: No focal suspicious marrow enhancement within the visualized bony anatomy. Skip that trace free fluid noted around the liver IMPRESSION: 1. 6.5 x 5.8 x 4.9 cm rim enhancing mass in the anterior pancreatic head is contiguous with the posterior  wall the duodenum bulb and medial wall of the descending duodenum. Imaging features suggest direct invasion of the medial wall of the descending duodenum. Primary pancreatic adenocarcinoma would be distinct consideration. Primary duodenal neoplasm with secondary involvement of the pancreatic head is considered less likely but not excluded. Lesion probably abuts the inferior aspect of the portal vein. 2. 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 3. Intrahepatic and extrahepatic biliary duct dilatation with abrupt cut off of the common bile duct just proximal to the ampulla. This is associated with mild diffuse dilatation of the main pancreatic duct. 4. Mass lesion compresses the IVC posteriorly against the spine. IVC involvement not excluded. 5. No evidence for hepatic metastases nor metastatic lymphadenopathy in the abdomen. 6. Trace free fluid around the liver. Electronically Signed   By: Misty Stanley M.D.   On: 06/29/2022 12:08   MR 3D Recon At Scanner  Result Date: 06/29/2022 CLINICAL DATA:  Pancreatic head mass on CT scan earlier same day. EXAM: MRI ABDOMEN WITHOUT AND WITH CONTRAST (INCLUDING MRCP) TECHNIQUE: Multiplanar multisequence MR imaging of the abdomen was performed both before and after the administration of intravenous contrast. Heavily T2-weighted images of the biliary and pancreatic ducts were obtained, and three-dimensional MRCP images were rendered by post processing. CONTRAST:  31m GADAVIST GADOBUTROL 1 MMOL/ML IV SOLN COMPARISON:  CT scan abdomen/pelvis 06/29/2022 FINDINGS: Lower chest: Unremarkable. Hepatobiliary: No suspicious focal abnormality within the liver parenchyma. Gallbladder surgically absent. Mild intrahepatic biliary duct dilatation is associated with 16 mm diameter common duct in the porta hepatis and dilatation of the cystic duct and common bile duct abruptly tapering just proximal to the ampulla. Pancreas: 6.5 x 5.8 x 4.9 cm  rim enhancing ill-defined mass is identified in the anterior pancreatic head. Mild diffuse dilatation of the main pancreatic duct although mass lesion is anterior to the common bile duct and main pancreatic duct. Lesion probably abuts the inferior aspect portal vein and coronal imaging shows lateral margin of the lesion indistinguishable from the medial wall of the descending duodenum (see coronal 16/12). As on CT scan earlier  today, filling defect in the superior mesenteric vein (postcontrast image 63/series 30) is consistent with nonocclusive thrombus in the superior mesenteric vein near the portal splenic confluence. Spleen: No splenomegaly. No focal mass lesion. Adrenals/Urinary Tract: No adrenal nodule or mass. Kidneys unremarkable. Stomach/Bowel: Stomach is nondistended. Although assessment is motion degraded, the anterior pancreatic head mass is contiguous with the posterior wall the duodenum and medial wall of the descending duodenum. Coronal T2 imaging is highly suspicious for tumor invasion of the medial wall of the descending duodenum (see axial T2 image 30 of series 2 and axial T2 haste image 29 series 16. This region is not well evaluated on postcontrast imaging due to motion artifact but appearance is similar suggesting duodenal invasion. Vascular/Lymphatic: Other:  Trace free fluid noted around the liver. Musculoskeletal: No focal suspicious marrow enhancement within the visualized bony anatomy. Skip that trace free fluid noted around the liver IMPRESSION: 1. 6.5 x 5.8 x 4.9 cm rim enhancing mass in the anterior pancreatic head is contiguous with the posterior wall the duodenum bulb and medial wall of the descending duodenum. Imaging features suggest direct invasion of the medial wall of the descending duodenum. Primary pancreatic adenocarcinoma would be distinct consideration. Primary duodenal neoplasm with secondary involvement of the pancreatic head is considered less likely but not excluded.  Lesion probably abuts the inferior aspect of the portal vein. 2. 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 3. Intrahepatic and extrahepatic biliary duct dilatation with abrupt cut off of the common bile duct just proximal to the ampulla. This is associated with mild diffuse dilatation of the main pancreatic duct. 4. Mass lesion compresses the IVC posteriorly against the spine. IVC involvement not excluded. 5. No evidence for hepatic metastases nor metastatic lymphadenopathy in the abdomen. 6. Trace free fluid around the liver. Electronically Signed   By: Misty Stanley M.D.   On: 06/29/2022 12:08   CT Abdomen Pelvis W Contrast  Result Date: 06/29/2022 CLINICAL DATA:  Found on floor beside bed, left hip pain. EXAM: CT ABDOMEN AND PELVIS WITH CONTRAST TECHNIQUE: Multidetector CT imaging of the abdomen and pelvis was performed using the standard protocol following bolus administration of intravenous contrast. RADIATION DOSE REDUCTION: This exam was performed according to the departmental dose-optimization program which includes automated exposure control, adjustment of the mA and/or kV according to patient size and/or use of iterative reconstruction technique. CONTRAST:  76m OMNIPAQUE IOHEXOL 300 MG/ML  SOLN COMPARISON:  10/06/2019. FINDINGS: Lower chest: Atelectasis is present at the lung bases. Hepatobiliary: There is mild nodularity of the liver. No focal abnormality is identified. Intrahepatic and extrahepatic biliary ductal dilatation is noted. The common bile duct measures up to 1.6 cm in diameter. The gallbladder is surgically absent. Pancreas: Hypodense masslike region is present in the pancreatic head measuring 6.5 x 4.8 x 6.5 cm. There is dilatation of the pancreatic duct measuring up to 6 mm. Peripancreatic fat stranding is noted at the head. Spleen: Normal in size without focal abnormality. Adrenals/Urinary Tract: No adrenal nodule or mass. The kidneys  enhance symmetrically. A punctate calculus is noted in the upper pole of the right kidney. There is mild-to-moderate hydroureteronephrosis on the right with urothelial enhancement with a 3 mm stone in the anticipated region of the distal right ureter, axial image 67 possible ureteral calculus or phlebolith. The bladder is within normal limits. Stomach/Bowel: Stomach is within normal limits. There is bowel wall thickening involving the first second and third portions of the duodenum. No  bowel obstruction, free air, or pneumatosis. The appendix is not seen. Vascular/Lymphatic: The portal vein and splenic vein are within normal limits. Nonocclusive thrombus is present in the superior mesenteric vein at the level of the pancreatic lesion. Aortic atherosclerosis without evidence of aneurysm. No abdominal or pelvic lymphadenopathy. Reproductive: Status post hysterectomy. No adnexal masses. Other: Trace amount of perihepatic fluid is noted. 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 Fixation hardware is noted in the proximal left femur. Degenerative changes are present in the thoracolumbar spine. Stable compression deformities are present in the superior endplates at L1 and L2. Severe degenerative changes are noted at L4-L5 and L5-S1, unchanged from 2020. No acute osseous abnormality. IMPRESSION: 1. Hypodense masslike region in the pancreatic head with surrounding fat stranding measuring 6.5 x 4.8 x 6.5 cm resulting in biliary and pancreatic duct ductal dilatation. Findings are concerning for pancreatic adenocarcinoma versus pancreatitis with developing abscess. Correlation with amylase and lipase and CA 19-9 is recommended. MRI with contrast is suggested for further evaluation. 2. 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 3. Morphologic changes of cirrhosis in the liver. 4. Mild-to-moderate hydroureteronephrosis on the right with  ureteral enhancement. A 3 mm calculus is present in the anticipated region of the distal right ureter, possible ureteral calculus or phlebolith. Clinical correlation is recommended to exclude superimposed infection. 5. Aortic atherosclerosis. Electronically Signed   By: Brett Fairy M.D.   On: 06/29/2022 04:15   CT Cervical Spine Wo Contrast  Result Date: 06/29/2022 CLINICAL DATA:  Trauma. EXAM: CT HEAD WITHOUT CONTRAST CT CERVICAL SPINE WITHOUT CONTRAST TECHNIQUE: Multidetector CT imaging of the head and cervical spine was performed following the standard protocol without intravenous contrast. Multiplanar CT image reconstructions of the cervical spine were also generated. RADIATION DOSE REDUCTION: This exam was performed according to the departmental dose-optimization program which includes automated exposure control, adjustment of the mA and/or kV according to patient size and/or use of iterative reconstruction technique. COMPARISON:  Head CT dated 10/31/2018. FINDINGS: CT HEAD FINDINGS Brain: Mild age-related atrophy and chronic microvascular ischemic changes. There is no acute intracranial hemorrhage. No mass effect or midline shift. No extra-axial fluid collection. Vascular: No hyperdense vessel or unexpected calcification. Skull: Normal. Negative for fracture or focal lesion. Sinuses/Orbits: No acute finding. Other: None CT CERVICAL SPINE FINDINGS Alignment: No acute subluxation. Skull base and vertebrae: No acute fracture. Soft tissues and spinal canal: No prevertebral fluid or swelling. No visible canal hematoma. Disc levels:  Multilevel degenerative changes. Upper chest: Negative. Other: None IMPRESSION: 1. No acute intracranial pathology. Mild age-related atrophy and chronic microvascular ischemic changes. 2. No acute cervical spine pathology. Multilevel degenerative changes. Electronically Signed   By: Anner Crete M.D.   On: 06/29/2022 02:57   CT Head Wo Contrast  Result Date:  06/29/2022 CLINICAL DATA:  Trauma. EXAM: CT HEAD WITHOUT CONTRAST CT CERVICAL SPINE WITHOUT CONTRAST TECHNIQUE: Multidetector CT imaging of the head and cervical spine was performed following the standard protocol without intravenous contrast. Multiplanar CT image reconstructions of the cervical spine were also generated. RADIATION DOSE REDUCTION: This exam was performed according to the departmental dose-optimization program which includes automated exposure control, adjustment of the mA and/or kV according to patient size and/or use of iterative reconstruction technique. COMPARISON:  Head CT dated 10/31/2018. FINDINGS: CT HEAD FINDINGS Brain: Mild age-related atrophy and chronic microvascular ischemic changes. There is no acute intracranial hemorrhage. No mass effect or midline shift. No  extra-axial fluid collection. Vascular: No hyperdense vessel or unexpected calcification. Skull: Normal. Negative for fracture or focal lesion. Sinuses/Orbits: No acute finding. Other: None CT CERVICAL SPINE FINDINGS Alignment: No acute subluxation. Skull base and vertebrae: No acute fracture. Soft tissues and spinal canal: No prevertebral fluid or swelling. No visible canal hematoma. Disc levels:  Multilevel degenerative changes. Upper chest: Negative. Other: None IMPRESSION: 1. No acute intracranial pathology. Mild age-related atrophy and chronic microvascular ischemic changes. 2. No acute cervical spine pathology. Multilevel degenerative changes. Electronically Signed   By: Anner Crete M.D.   On: 06/29/2022 02:57   DG Chest Port 1 View  Result Date: 06/29/2022 CLINICAL DATA:  Fall. EXAM: PORTABLE CHEST 1 VIEW COMPARISON:  03/19/2019. FINDINGS: Heart is enlarged and the mediastinal contour is within normal limits. Atherosclerotic calcification of the aorta is noted. No consolidation, effusion, or pneumothorax. No acute osseous abnormality. IMPRESSION: No active disease. Electronically Signed   By: Brett Fairy M.D.    On: 06/29/2022 00:29   DG Knee Complete 4 Views Right  Result Date: 06/29/2022 CLINICAL DATA:  Fall. EXAM: RIGHT KNEE - COMPLETE 4+ VIEW COMPARISON:  02/07/2013. FINDINGS: No evidence of fracture, dislocation, or joint effusion. Mild tricompartmental degenerative changes are noted. Soft tissue swelling is present anterior to the knee. IMPRESSION: 1. No acute fracture or dislocation. 2. Mild tricompartmental degenerative changes. Electronically Signed   By: Brett Fairy M.D.   On: 06/29/2022 00:28   DG HIPS BILAT WITH PELVIS 3-4 VIEWS  Result Date: 06/29/2022 CLINICAL DATA:  Fall. EXAM: DG HIP (WITH OR WITHOUT PELVIS) 3-4V BILAT COMPARISON:  10/27/2016, 12/19/2020, 10/06/2019. FINDINGS: There is no evidence of acute hip fracture or dislocation. There is bony deformity of the inferior pubic ramus on the left which is new from the previous exam. Fixation hardware is present in the left femur with bony deformity of the mid femoral shaft compatible with old healed fracture. Joint space is maintained at the hips bilaterally. Degenerative changes are present in the lower lumbar spine. IMPRESSION: 1. Bony deformity of the inferior pubic ramus on the left which is new from 2020, suggesting old healed fracture. Clinical correlation is recommended to exclude acute fracture. 2. Old healed fracture of the distal left femur with hardware in place. Electronically Signed   By: Brett Fairy M.D.   On: 06/29/2022 00:25     MR ABDOMEN MRCP W WO CONTAST  Result Date: 06/29/2022 CLINICAL DATA:  Pancreatic head mass on CT scan earlier same day. EXAM: MRI ABDOMEN WITHOUT AND WITH CONTRAST (INCLUDING MRCP) TECHNIQUE: Multiplanar multisequence MR imaging of the abdomen was performed both before and after the administration of intravenous contrast. Heavily T2-weighted images of the biliary and pancreatic ducts were obtained, and three-dimensional MRCP images were rendered by post processing. CONTRAST:  59m GADAVIST GADOBUTROL  1 MMOL/ML IV SOLN COMPARISON:  CT scan abdomen/pelvis 06/29/2022 FINDINGS: Lower chest: Unremarkable. Hepatobiliary: No suspicious focal abnormality within the liver parenchyma. Gallbladder surgically absent. Mild intrahepatic biliary duct dilatation is associated with 16 mm diameter common duct in the porta hepatis and dilatation of the cystic duct and common bile duct abruptly tapering just proximal to the ampulla. Pancreas: 6.5 x 5.8 x 4.9 cm rim enhancing ill-defined mass is identified in the anterior pancreatic head. Mild diffuse dilatation of the main pancreatic duct although mass lesion is anterior to the common bile duct and main pancreatic duct. Lesion probably abuts the inferior aspect portal vein and coronal imaging shows lateral margin of the lesion indistinguishable  from the medial wall of the descending duodenum (see coronal 16/12). 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 Spleen: No splenomegaly. No focal mass lesion. Adrenals/Urinary Tract: No adrenal nodule or mass. Kidneys unremarkable. Stomach/Bowel: Stomach is nondistended. Although assessment is motion degraded, the anterior pancreatic head mass is contiguous with the posterior wall the duodenum and medial wall of the descending duodenum. Coronal T2 imaging is highly suspicious for tumor invasion of the medial wall of the descending duodenum (see axial T2 image 30 of series 2 and axial T2 haste image 29 series 16. This region is not well evaluated on postcontrast imaging due to motion artifact but appearance is similar suggesting duodenal invasion. Vascular/Lymphatic: Other:  Trace free fluid noted around the liver. Musculoskeletal: No focal suspicious marrow enhancement within the visualized bony anatomy. Skip that trace free fluid noted around the liver IMPRESSION: 1.  6.5 x 5.8 x 4.9 cm rim enhancing mass in the anterior pancreatic head is contiguous with the posterior wall the duodenum bulb and medial wall of the descending duodenum. Imaging features suggest direct invasion of the medial wall of the descending duodenum. Primary pancreatic adenocarcinoma would be distinct consideration. Primary duodenal neoplasm with secondary involvement of the pancreatic head is considered less likely but not excluded. Lesion probably abuts the inferior aspect of the portal vein. 2. 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 3. Intrahepatic and extrahepatic biliary duct dilatation with abrupt cut off of the common bile duct just proximal to the ampulla. This is associated with mild diffuse dilatation of the main pancreatic duct. 4. Mass lesion compresses the IVC posteriorly against the spine. IVC involvement not excluded. 5. No evidence for hepatic metastases nor metastatic lymphadenopathy in the abdomen. 6. Trace free fluid around the liver. Electronically Signed   By: Misty Stanley M.D.   On: 06/29/2022 12:08   MR 3D Recon At Scanner  Result Date: 06/29/2022 CLINICAL DATA:  Pancreatic head mass on CT scan earlier same day. EXAM: MRI ABDOMEN WITHOUT AND WITH CONTRAST (INCLUDING MRCP) TECHNIQUE: Multiplanar multisequence MR imaging of the abdomen was performed both before and after the administration of intravenous contrast. Heavily T2-weighted images of the biliary and pancreatic ducts were obtained, and three-dimensional MRCP images were rendered by post processing. CONTRAST:  24m GADAVIST GADOBUTROL 1 MMOL/ML IV SOLN COMPARISON:  CT scan abdomen/pelvis 06/29/2022 FINDINGS: Lower chest: Unremarkable. Hepatobiliary: No suspicious focal abnormality within the liver parenchyma. Gallbladder surgically absent. Mild intrahepatic biliary duct dilatation is associated with 16 mm diameter common duct in the porta hepatis and dilatation of the cystic  duct and common bile duct abruptly tapering just proximal to the ampulla. Pancreas: 6.5 x 5.8 x 4.9 cm rim enhancing ill-defined mass is identified in the anterior pancreatic head. Mild diffuse dilatation of the main pancreatic duct although mass lesion is anterior to the common bile duct and main pancreatic duct. Lesion probably abuts the inferior aspect portal vein and coronal imaging shows lateral margin of the lesion indistinguishable from the medial wall of the descending duodenum (see coronal 16/12). 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 Spleen: No splenomegaly. No focal mass lesion. Adrenals/Urinary Tract: No adrenal nodule or mass. Kidneys unremarkable. Stomach/Bowel: Stomach  is nondistended. Although assessment is motion degraded, the anterior pancreatic head mass is contiguous with the posterior wall the duodenum and medial wall of the descending duodenum. Coronal T2 imaging is highly suspicious for tumor invasion of the medial wall of the descending duodenum (see axial T2 image 30 of series 2 and axial T2 haste image 29 series 16. This region is not well evaluated on postcontrast imaging due to motion artifact but appearance is similar suggesting duodenal invasion. Vascular/Lymphatic: Other:  Trace free fluid noted around the liver. Musculoskeletal: No focal suspicious marrow enhancement within the visualized bony anatomy. Skip that trace free fluid noted around the liver IMPRESSION: 1. 6.5 x 5.8 x 4.9 cm rim enhancing mass in the anterior pancreatic head is contiguous with the posterior wall the duodenum bulb and medial wall of the descending duodenum. Imaging features suggest direct invasion of the medial wall of the descending duodenum. Primary pancreatic adenocarcinoma would be distinct consideration. Primary duodenal  neoplasm with secondary involvement of the pancreatic head is considered less likely but not excluded. Lesion probably abuts the inferior aspect of the portal vein. 2. 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 3. Intrahepatic and extrahepatic biliary duct dilatation with abrupt cut off of the common bile duct just proximal to the ampulla. This is associated with mild diffuse dilatation of the main pancreatic duct. 4. Mass lesion compresses the IVC posteriorly against the spine. IVC involvement not excluded. 5. No evidence for hepatic metastases nor metastatic lymphadenopathy in the abdomen. 6. Trace free fluid around the liver. Electronically Signed   By: Misty Stanley M.D.   On: 06/29/2022 12:08   CT Abdomen Pelvis W Contrast  Result Date: 06/29/2022 CLINICAL DATA:  Found on floor beside bed, left hip pain. EXAM: CT ABDOMEN AND PELVIS WITH CONTRAST TECHNIQUE: Multidetector CT imaging of the abdomen and pelvis was performed using the standard protocol following bolus administration of intravenous contrast. RADIATION DOSE REDUCTION: This exam was performed according to the departmental dose-optimization program which includes automated exposure control, adjustment of the mA and/or kV according to patient size and/or use of iterative reconstruction technique. CONTRAST:  39m OMNIPAQUE IOHEXOL 300 MG/ML  SOLN COMPARISON:  10/06/2019. FINDINGS: Lower chest: Atelectasis is present at the lung bases. Hepatobiliary: There is mild nodularity of the liver. No focal abnormality is identified. Intrahepatic and extrahepatic biliary ductal dilatation is noted. The common bile duct measures up to 1.6 cm in diameter. The gallbladder is surgically absent. Pancreas: Hypodense masslike region is present in the pancreatic head measuring 6.5 x 4.8 x 6.5 cm. There is dilatation of the pancreatic duct measuring up to 6 mm. Peripancreatic fat stranding is noted at the head. Spleen:  Normal in size without focal abnormality. Adrenals/Urinary Tract: No adrenal nodule or mass. The kidneys enhance symmetrically. A punctate calculus is noted in the upper pole of the right kidney. There is mild-to-moderate hydroureteronephrosis on the right with urothelial enhancement with a 3 mm stone in the anticipated region of the distal right ureter, axial image 67 possible ureteral calculus or phlebolith. The bladder is within normal limits. Stomach/Bowel: Stomach is within normal limits. There is bowel wall thickening involving the first second and third portions of the duodenum. No bowel obstruction, free air, or pneumatosis. The appendix is not seen. Vascular/Lymphatic: The portal vein and splenic vein are within normal limits. Nonocclusive thrombus is present in the superior mesenteric vein at the level of the pancreatic lesion. Aortic atherosclerosis without evidence of aneurysm. No  abdominal or pelvic lymphadenopathy. Reproductive: Status post hysterectomy. No adnexal masses. Other: Trace amount of perihepatic fluid is noted. 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 Fixation hardware is noted in the proximal left femur. Degenerative changes are present in the thoracolumbar spine. Stable compression deformities are present in the superior endplates at L1 and L2. Severe degenerative changes are noted at L4-L5 and L5-S1, unchanged from 2020. No acute osseous abnormality. IMPRESSION: 1. Hypodense masslike region in the pancreatic head with surrounding fat stranding measuring 6.5 x 4.8 x 6.5 cm resulting in biliary and pancreatic duct ductal dilatation. Findings are concerning for pancreatic adenocarcinoma versus pancreatitis with developing abscess. Correlation with amylase and lipase and CA 19-9 is recommended. MRI with contrast is suggested for further evaluation. 2. 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 3.  Morphologic changes of cirrhosis in the liver. 4. Mild-to-moderate hydroureteronephrosis on the right with ureteral enhancement. A 3 mm calculus is present in the anticipated region of the distal right ureter, possible ureteral calculus or phlebolith. Clinical correlation is recommended to exclude superimposed infection. 5. Aortic atherosclerosis. Electronically Signed   By: Brett Fairy M.D.   On: 06/29/2022 04:15   CT Cervical Spine Wo Contrast  Result Date: 06/29/2022 CLINICAL DATA:  Trauma. EXAM: CT HEAD WITHOUT CONTRAST CT CERVICAL SPINE WITHOUT CONTRAST TECHNIQUE: Multidetector CT imaging of the head and cervical spine was performed following the standard protocol without intravenous contrast. Multiplanar CT image reconstructions of the cervical spine were also generated. RADIATION DOSE REDUCTION: This exam was performed according to the departmental dose-optimization program which includes automated exposure control, adjustment of the mA and/or kV according to patient size and/or use of iterative reconstruction technique. COMPARISON:  Head CT dated 10/31/2018. FINDINGS: CT HEAD FINDINGS Brain: Mild age-related atrophy and chronic microvascular ischemic changes. There is no acute intracranial hemorrhage. No mass effect or midline shift. No extra-axial fluid collection. Vascular: No hyperdense vessel or unexpected calcification. Skull: Normal. Negative for fracture or focal lesion. Sinuses/Orbits: No acute finding. Other: None CT CERVICAL SPINE FINDINGS Alignment: No acute subluxation. Skull base and vertebrae: No acute fracture. Soft tissues and spinal canal: No prevertebral fluid or swelling. No visible canal hematoma. Disc levels:  Multilevel degenerative changes. Upper chest: Negative. Other: None IMPRESSION: 1. No acute intracranial pathology. Mild age-related atrophy and chronic microvascular ischemic changes. 2. No acute cervical spine pathology. Multilevel degenerative changes. Electronically  Signed   By: Anner Crete M.D.   On: 06/29/2022 02:57   CT Head Wo Contrast  Result Date: 06/29/2022 CLINICAL DATA:  Trauma. EXAM: CT HEAD WITHOUT CONTRAST CT CERVICAL SPINE WITHOUT CONTRAST TECHNIQUE: Multidetector CT imaging of the head and cervical spine was performed following the standard protocol without intravenous contrast. Multiplanar CT image reconstructions of the cervical spine were also generated. RADIATION DOSE REDUCTION: This exam was performed according to the departmental dose-optimization program which includes automated exposure control, adjustment of the mA and/or kV according to patient size and/or use of iterative reconstruction technique. COMPARISON:  Head CT dated 10/31/2018. FINDINGS: CT HEAD FINDINGS Brain: Mild age-related atrophy and chronic microvascular ischemic changes. There is no acute intracranial hemorrhage. No mass effect or midline shift. No extra-axial fluid collection. Vascular: No hyperdense vessel or unexpected calcification. Skull: Normal. Negative for fracture or focal lesion. Sinuses/Orbits: No acute finding. Other: None CT CERVICAL SPINE FINDINGS Alignment: No acute subluxation. Skull base and vertebrae: No acute fracture. Soft tissues and spinal canal: No  prevertebral fluid or swelling. No visible canal hematoma. Disc levels:  Multilevel degenerative changes. Upper chest: Negative. Other: None IMPRESSION: 1. No acute intracranial pathology. Mild age-related atrophy and chronic microvascular ischemic changes. 2. No acute cervical spine pathology. Multilevel degenerative changes. Electronically Signed   By: Anner Crete M.D.   On: 06/29/2022 02:57   DG Chest Port 1 View  Result Date: 06/29/2022 CLINICAL DATA:  Fall. EXAM: PORTABLE CHEST 1 VIEW COMPARISON:  03/19/2019. FINDINGS: Heart is enlarged and the mediastinal contour is within normal limits. Atherosclerotic calcification of the aorta is noted. No consolidation, effusion, or pneumothorax. No acute  osseous abnormality. IMPRESSION: No active disease. Electronically Signed   By: Brett Fairy M.D.   On: 06/29/2022 00:29   DG Knee Complete 4 Views Right  Result Date: 06/29/2022 CLINICAL DATA:  Fall. EXAM: RIGHT KNEE - COMPLETE 4+ VIEW COMPARISON:  02/07/2013. FINDINGS: No evidence of fracture, dislocation, or joint effusion. Mild tricompartmental degenerative changes are noted. Soft tissue swelling is present anterior to the knee. IMPRESSION: 1. No acute fracture or dislocation. 2. Mild tricompartmental degenerative changes. Electronically Signed   By: Brett Fairy M.D.   On: 06/29/2022 00:28   DG HIPS BILAT WITH PELVIS 3-4 VIEWS  Result Date: 06/29/2022 CLINICAL DATA:  Fall. EXAM: DG HIP (WITH OR WITHOUT PELVIS) 3-4V BILAT COMPARISON:  10/27/2016, 12/19/2020, 10/06/2019. FINDINGS: There is no evidence of acute hip fracture or dislocation. There is bony deformity of the inferior pubic ramus on the left which is new from the previous exam. Fixation hardware is present in the left femur with bony deformity of the mid femoral shaft compatible with old healed fracture. Joint space is maintained at the hips bilaterally. Degenerative changes are present in the lower lumbar spine. IMPRESSION: 1. Bony deformity of the inferior pubic ramus on the left which is new from 2020, suggesting old healed fracture. Clinical correlation is recommended to exclude acute fracture. 2. Old healed fracture of the distal left femur with hardware in place. Electronically Signed   By: Brett Fairy M.D.   On: 06/29/2022 00:25    Assessment and Plan:  1) pancreatic mass with obstructive jaundice -CT abdomen/pelvis with contrast 06/29/2022- "1. Hypodense masslike region in the pancreatic head with surrounding fat stranding measuring 6.5 x 4.8 x 6.5 cm resulting in biliary and pancreatic duct ductal dilatation. Findings are concerning for pancreatic adenocarcinoma versus pancreatitis with developing abscess. Correlation with  amylase and lipase and CA 19-9 is recommended. MRI with contrast is suggested for further evaluation. 2. 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 3. Morphologic changes of cirrhosis in the liver. 4. Mild-to-moderate hydroureteronephrosis on the right with ureteral enhancement. A 3 mm calculus is present in the anticipated region of the distal right ureter, possible ureteral calculus or phlebolith. Clinical correlation is recommended to exclude superimposed infection. 5. Aortic atherosclerosis." -MRCP 06/29/2022- "1. 6.5 x 5.8 x 4.9 cm rim enhancing mass in the anterior pancreatic head is contiguous with the posterior wall the duodenum bulb and medial wall of the descending duodenum. Imaging features suggest direct invasion of the medial wall of the descending duodenum. Primary pancreatic adenocarcinoma would be distinct consideration. Primary duodenal neoplasm with secondary involvement of the pancreatic head is considered less likely but not excluded. Lesion probably abuts the inferior aspect of the portal vein. 2. 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 3. Intrahepatic and extrahepatic biliary duct dilatation with abrupt cut off of  the common bile duct just proximal to the ampulla. This is associated with mild diffuse dilatation of the main pancreatic duct. 4. Mass lesion compresses the IVC posteriorly against the spine. IVC involvement not excluded. 5. No evidence for hepatic metastases nor metastatic  lymphadenopathy in the abdomen. 6. Trace free fluid around the liver." -CA 19.9 performed on 06/29/2022- 19 -CEA performed 07/02/2022- pending -CT chest with contrast-pending  2) iron deficiency anemia -Labs from 06/29/2022-ferritin 9, iron 23, percent saturation 7% -07/01/2022 stool for occult blood-positive  3) nonocclusive SMV thrombus  4) cirrhosis  5) hydroureteronephrosis  6) alcohol and  polysubstance abuse  7) acute metabolic encephalopathy  PLAN: -Imaging and lab results reviewed.  Mass noted in head of the pancreas concerning for pancreatic cancer versus duodenal cancer. -She will undergo ERCP and EUS later today and we will follow-up on results. -Obtain CEA. -Obtain CT chest to complete her staging work-up. -Further discussion regarding treatment options pending the above results. -Recommend IV iron prior to hospital discharge for treatment of iron deficiency anemia. -On CIWA protocol for alcohol and polysubstance abuse.  Thank you for this referral.   Mikey Bussing, DNP, AGPCNP-BC, AOCNP    ADDENDUM  .Patient was Personally and independently interviewed, examined and relevant elements of the history of present illness were reviewed in details and an assessment and plan was created. All elements of the patient's history of present illness , assessment and plan were discussed in details with Mikey Bussing, DNP, AGPCNP-BC, AOCNP . The above documentation reflects our combined findings assessment and plan.   Sullivan Lone MD MS

## 2022-07-03 ENCOUNTER — Encounter (HOSPITAL_COMMUNITY): Payer: Self-pay | Admitting: Gastroenterology

## 2022-07-03 DIAGNOSIS — G9340 Encephalopathy, unspecified: Secondary | ICD-10-CM | POA: Diagnosis not present

## 2022-07-03 DIAGNOSIS — N179 Acute kidney failure, unspecified: Secondary | ICD-10-CM | POA: Diagnosis not present

## 2022-07-03 DIAGNOSIS — F1010 Alcohol abuse, uncomplicated: Secondary | ICD-10-CM | POA: Diagnosis not present

## 2022-07-03 DIAGNOSIS — R748 Abnormal levels of other serum enzymes: Secondary | ICD-10-CM | POA: Diagnosis not present

## 2022-07-03 LAB — BASIC METABOLIC PANEL
Anion gap: 7 (ref 5–15)
BUN: 7 mg/dL — ABNORMAL LOW (ref 8–23)
CO2: 23 mmol/L (ref 22–32)
Calcium: 7.4 mg/dL — ABNORMAL LOW (ref 8.9–10.3)
Chloride: 105 mmol/L (ref 98–111)
Creatinine, Ser: 0.54 mg/dL (ref 0.44–1.00)
GFR, Estimated: >60 mL/min (ref >60)
Glucose, Bld: 132 mg/dL — ABNORMAL HIGH (ref 70–99)
Potassium: 3.4 mmol/L — ABNORMAL LOW (ref 3.5–5.1)
Sodium: 135 mmol/L (ref 135–145)

## 2022-07-03 LAB — VITAMIN D 25 HYDROXY (VIT D DEFICIENCY, FRACTURES): Vit D, 25-Hydroxy: 37.38 ng/mL (ref 30–100)

## 2022-07-03 LAB — MAGNESIUM: Magnesium: 1.6 mg/dL — ABNORMAL LOW (ref 1.7–2.4)

## 2022-07-03 LAB — CEA: CEA: 28.7 ng/mL — ABNORMAL HIGH (ref 0.0–4.7)

## 2022-07-03 LAB — CYTOLOGY - NON PAP

## 2022-07-03 LAB — GLUCOSE, CAPILLARY: Glucose-Capillary: 143 mg/dL — ABNORMAL HIGH (ref 70–99)

## 2022-07-03 MED ORDER — POTASSIUM CHLORIDE 10 MEQ/100ML IV SOLN
10.0000 meq | INTRAVENOUS | Status: AC
  Administered 2022-07-03 (×6): 10 meq via INTRAVENOUS
  Filled 2022-07-03 (×6): qty 100

## 2022-07-03 MED ORDER — SODIUM CHLORIDE 0.9 % IV SOLN: INTRAVENOUS | Status: DC | PRN

## 2022-07-03 MED ORDER — MAGNESIUM SULFATE 4 GM/100ML IV SOLN
4.0000 g | Freq: Once | INTRAVENOUS | Status: AC
Start: 2022-07-03 — End: 2022-07-03
  Administered 2022-07-03: 4 g via INTRAVENOUS
  Filled 2022-07-03: qty 100

## 2022-07-03 MED ORDER — SODIUM CHLORIDE 0.9 % IV SOLN
2.0000 g | INTRAVENOUS | Status: DC
  Administered 2022-07-03 – 2022-07-04 (×2): 2 g via INTRAVENOUS
  Filled 2022-07-03 (×2): qty 20

## 2022-07-03 NOTE — Progress Notes (Signed)
PROGRESS NOTE    [name]  HUT:[id number] DOB: 20-Mar-1946 DOA: 06/28/2022 PCP: Lois Huxley, PA   Brief Narrative:  76 y.o. female with medical history significant of alcohol abuse, chronic pain with history of oxycodone overdose in the past, depression, asthma, GERD, hyperlipidemia, psoriasis, left renal cell carcinoma status post partial nephrectomy in 2012, history of cholecystectomy presented with suspected accidental oxycodone overdose as she was lethargic on initial evaluation.  On presentation, she had leukocytosis with elevated LFTs, T. bili of 8.2, ammonia 59.  Chest x-ray showed no active disease.  She was found to have pancreatic head mass along with biliary and pancreatic duct dilatation on imaging along with nonocclusive thrombus in the superior mesenteric vein with changes of possible cirrhosis in the liver and mild to moderate hydroureteronephrosis on the right with possible distal right ureter calculus or phlebolith.  GI urology and oncology were consulted.  She underwent EUS and ERCP with CBD stenting on 07/02/2022 by GI.    Assessment & Plan:   Possible pancreatic cancer with biliary and pancreatic duct dilatation with biliary stricture and elevated LFTs -Status post ERCP and EUS on 07/02/2022 and placement of metallic CBD stent by GI.  Preliminary diagnosis consistent with adenocarcinoma.  Oncology following.  Follow further GI and oncology recommendations. -Monitor LFTs.  Nonocclusive SMV thrombus -We will follow oncology recommendations regarding timing of anticoagulation  Rhabdomyolysis -Currently on IV fluids.  CK level was improving.  Repeat a.m. labs.  Decrease IV fluids to 75 cc an hour  Obstructive uropathy UTI: Present on admission -CT showed mild to moderate hydro ureteral nephrosis with possible distal right ureter calculus or phlebolith.  Urology evaluation appreciated: Since patient's hydronephrosis resolved after the bladder was empty, it was not probably  due to ureteral calculus but was possibly due to phlebolith.  Urology signed off.  No work-up needed. -Currently on Rocephin.  Urine culture grew E. coli.  Alcohol abuse -Continue CIWA protocol.  Continue thiamine, multivitamin and folic acid.  -Kalemia--replace.  Repeat a.m. labs  Hypomagnesemia--replace.  A.m. labs  Leukocytosis -Mild.  Monitor.  Generalized deconditioning -PT eval.   DVT prophylaxis: SCDs Code Status: Full Family Communication: None at bedside Disposition Plan: Status is: Inpatient Remains inpatient appropriate because: Of severity of illness  Consultants: GI/oncology/urology  Procedures: ERCP/EUS on 07/02/2022  Antimicrobials:  Anti-infectives (From admission, onward)    Start     Dose/Rate Route Frequency Ordered Stop   07/01/22 1700  cefTRIAXone (ROCEPHIN) 2 g in sodium chloride 0.9 % 100 mL IVPB        2 g 200 mL/hr over 30 Minutes Intravenous Every 24 hours 07/01/22 1341     06/30/22 1659  ceFEPIme (MAXIPIME) 2 g in sodium chloride 0.9 % 100 mL IVPB  Status:  Discontinued        2 g 200 mL/hr over 30 Minutes Intravenous Every 8 hours 06/30/22 1132 07/01/22 1341   06/30/22 1600  vancomycin (VANCOCIN) IVPB 1000 mg/200 mL premix  Status:  Discontinued        1,000 mg 200 mL/hr over 60 Minutes Intravenous Every 24 hours 06/29/22 1551 06/30/22 1132   06/30/22 1600  vancomycin (VANCOREADY) IVPB 1250 mg/250 mL  Status:  Discontinued        1,250 mg 166.7 mL/hr over 90 Minutes Intravenous Every 24 hours 06/30/22 1132 07/01/22 1341   06/29/22 1645  ceFEPIme (MAXIPIME) 2 g in sodium chloride 0.9 % 100 mL IVPB  Status:  Discontinued  2 g 200 mL/hr over 30 Minutes Intravenous Every 12 hours 06/29/22 1551 06/30/22 1132   06/29/22 1645  vancomycin (VANCOREADY) IVPB 1250 mg/250 mL        1,250 mg 166.7 mL/hr over 90 Minutes Intravenous  Once 06/29/22 1551 06/29/22 1908   06/29/22 1645  metroNIDAZOLE (FLAGYL) IVPB 500 mg  Status:  Discontinued         500 mg 100 mL/hr over 60 Minutes Intravenous Every 12 hours 06/29/22 1551 07/01/22 1341   06/29/22 0730  cefTRIAXone (ROCEPHIN) 1 g in sodium chloride 0.9 % 100 mL IVPB  Status:  Discontinued        1 g 200 mL/hr over 30 Minutes Intravenous Every 24 hours 06/29/22 0717 06/29/22 1539        Subjective: Patient seen and examined at bedside.  Nursing staff reports slight confusion today.  No overnight fever, seizures, agitation reported.  Objective: Vitals:   07/02/22 1555 07/02/22 2115 07/03/22 0456 07/03/22 1316  BP: 103/65 (!) 100/59 109/60 102/72  Pulse: 92 92 91 86  Resp: '18 17 17 18  '$ Temp: 97.9 F (36.6 C) 97.8 F (36.6 C) 98.3 F (36.8 C) 97.9 F (36.6 C)  TempSrc: Oral   Oral  SpO2: 100% 97% 100% 100%  Weight:      Height:        Intake/Output Summary (Last 24 hours) at 07/03/2022 1321 Last data filed at 07/03/2022 1101 Gross per 24 hour  Intake 3539.26 ml  Output 600 ml  Net 2939.26 ml   Filed Weights   06/28/22 2249 06/29/22 1442  Weight: 59 kg 62.1 kg    Examination:  General exam: Appears calm and comfortable.  Currently on room air.  Looks chronically ill and deconditioned. Respiratory system: Bilateral decreased breath sounds at bases with some scattered crackles Cardiovascular system: S1 & S2 heard, Rate controlled Gastrointestinal system: Abdomen is nondistended, soft and nontender. Normal bowel sounds heard. Extremities: No cyanosis, clubbing, edema  Central nervous system: Awake, extremely slow to respond.  Slightly confused to time.  No focal neurological deficits. Moving extremities Skin: No rashes, lesions or ulcers Psychiatry: Flat affect.  No signs of agitation.   Data Reviewed: I have personally reviewed following labs and imaging studies  CBC: Recent Labs  Lab 06/29/22 0049 06/29/22 0705 06/30/22 0348 07/02/22 0421  WBC 15.1* 15.0* 12.3* 11.4*  NEUTROABS 12.0*  --   --   --   HGB 10.0* 8.8* 8.6* 9.4*  HCT 29.6* 27.0* 25.2* 27.5*   MCV 73.8* 75.0* 72.0* 71.2*  PLT 426* 421* 401* 008   Basic Metabolic Panel: Recent Labs  Lab 06/29/22 0032 06/29/22 0705 06/30/22 0348 07/01/22 0342 07/02/22 0421 07/02/22 1646 07/03/22 0414  NA 131* 134* 135 137 137  --  135  K 2.2* 2.1* 2.6* 2.9* 3.2* 3.8 3.4*  CL 86* 91* 98 103 102  --  105  CO2 '29 30 28 24 24  '$ --  23  GLUCOSE 95 90 86 89 91  --  132*  BUN '21 20 12 8 '$ 6*  --  7*  CREATININE 1.20* 1.05* 0.56 0.46 0.43*  --  0.54  CALCIUM 8.5* 8.1* 7.9* 7.9* 7.6*  --  7.4*  MG 2.0  --   --   --  1.0* 1.9 1.6*   GFR: Estimated Creatinine Clearance: 61.4 mL/min (by C-G formula based on SCr of 0.54 mg/dL). Liver Function Tests: Recent Labs  Lab 06/29/22 0032 06/29/22 0705 06/30/22 0348 07/01/22 0342  AST  186* 212* 202* 192*  ALT 74* 73* 70* 75*  ALKPHOS 512* 481* 421* 380*  BILITOT 8.2* 7.6* 8.3* 9.8*  PROT 7.1 6.3* 5.7* 5.6*  ALBUMIN 2.9* 2.7* 2.3* 2.2*   Recent Labs  Lab 06/29/22 0032  LIPASE 49   Recent Labs  Lab 06/29/22 0133  AMMONIA 59*   Coagulation Profile: Recent Labs  Lab 06/29/22 0133  INR 1.4*   Cardiac Enzymes: Recent Labs  Lab 06/29/22 0032 06/29/22 0705 07/01/22 0342  CKTOTAL 1,903* 3,101* 1,378*   BNP (last 3 results) No results for input(s): "PROBNP" in the last 8760 hours. HbA1C: No results for input(s): "HGBA1C" in the last 72 hours. CBG: Recent Labs  Lab 06/29/22 0031  GLUCAP 102*   Lipid Profile: No results for input(s): "CHOL", "HDL", "LDLCALC", "TRIG", "CHOLHDL", "LDLDIRECT" in the last 72 hours. Thyroid Function Tests: No results for input(s): "TSH", "T4TOTAL", "FREET4", "T3FREE", "THYROIDAB" in the last 72 hours. Anemia Panel: No results for input(s): "VITAMINB12", "FOLATE", "FERRITIN", "TIBC", "IRON", "RETICCTPCT" in the last 72 hours. Sepsis Labs: Recent Labs  Lab 06/29/22 0253 06/29/22 1502  LATICACIDVEN 1.1 2.5*    Recent Results (from the past 240 hour(s))  Culture, blood (routine x 2)     Status:  None (Preliminary result)   Collection Time: 06/29/22  2:53 AM   Specimen: BLOOD  Result Value Ref Range Status   Specimen Description   Final    BLOOD RIGHT ANTECUBITAL Performed at [facility] [address], [place], [place] [address]    Special Requests   Final    BOTTLES DRAWN AEROBIC AND ANAEROBIC Blood Culture results may not be optimal due to an excessive volume of blood received in culture bottles Performed at [facility] [address], [place], [place] [address]    Culture   Final    NO GROWTH 4 DAYS Performed at [facility], [place] [address], [place], [place] [address]    Report Status PENDING  Incomplete  Culture, blood (routine x 2)     Status: None (Preliminary result)   Collection Time: 06/29/22  2:53 AM   Specimen: BLOOD  Result Value Ref Range Status   Specimen Description   Final    BLOOD LEFT ANTECUBITAL Performed at [facility] [address] [place], [place] [address]    Special Requests   Final    BOTTLES DRAWN AEROBIC AND ANAEROBIC Blood Culture results may not be optimal due to an excessive volume of blood received in culture bottles Performed at [facility] [address], [place], [place] [address]    Culture   Final    NO GROWTH 4 DAYS Performed at [facility], [place] [address], [place], [place] [address]    Report Status PENDING  Incomplete  Urine Culture     Status: Abnormal   Collection Time: 06/29/22  5:14 AM   Specimen: In/Out Cath Urine  Result Value Ref Range Status   Specimen Description   Final    IN/OUT CATH URINE Performed at [facility] [address] [place], [place] [address]    Special Requests   Final    NONE Performed at [facility], [place] [address], [place],  [address]    Culture >=100,000 COLONIES/mL ESCHERICHIA COLI (A)  Final   Report Status 07/01/2022 FINAL  Final   Organism ID,  Bacteria ESCHERICHIA COLI (A)  Final      Susceptibility  Escherichia coli - MIC*    AMPICILLIN <=2 SENSITIVE Sensitive     CEFAZOLIN <=4 SENSITIVE Sensitive     CEFEPIME <=0.12 SENSITIVE Sensitive     CEFTRIAXONE <=0.25 SENSITIVE Sensitive     CIPROFLOXACIN <=0.25 SENSITIVE Sensitive     GENTAMICIN <=1 SENSITIVE Sensitive     IMIPENEM <=0.25 SENSITIVE Sensitive     NITROFURANTOIN <=16 SENSITIVE Sensitive     TRIMETH/SULFA <=20 SENSITIVE Sensitive     AMPICILLIN/SULBACTAM <=2 SENSITIVE Sensitive     PIP/TAZO <=4 SENSITIVE Sensitive     * >=100,000 COLONIES/mL ESCHERICHIA COLI         Radiology Studies: DG ERCP  Result Date: 07/02/2022 CLINICAL DATA:  Pancreatic cancer. EXAM: ERCP COMPARISON:  CT AP, 06/29/2022.  MRCP, 06/29/2022. FLUOROSCOPY: Exposure Index (as provided by the fluoroscopic device): 32.5 mGy Kerma FINDINGS: Multiple, limited oblique planar images of the RIGHT upper quadrant obtained C-arm. Images demonstrating flexible endoscopy, biliary duct cannulation, retrograde cholangiogram and metallic biliary stent placement. Marked extrahepatic biliary ductal dilation and potential CBD occlusion, with a relative decompressed appearance after metallic biliary stent placement. IMPRESSION: Fluoroscopic imaging for ERCP and metallic biliary stent placement. Marked extrahepatic biliary ductal dilation, relatively decompressed after stent placement. For complete description of intra procedural findings, please see performing service dictation. Electronically Signed   By: Michaelle Birks M.D.   On: 07/02/2022 17:07   DG C-Arm 1-60 Min-No Report  Result Date: 07/02/2022 Fluoroscopy was utilized by the requesting physician.  No radiographic interpretation.   CT CHEST W CONTRAST  Result Date: 07/02/2022 CLINICAL DATA:  Pancreatic cancer staging.  * Tracking Code: BO * EXAM: CT CHEST WITH CONTRAST TECHNIQUE: Multidetector CT imaging of the chest was performed during intravenous contrast  administration. RADIATION DOSE REDUCTION: This exam was performed according to the departmental dose-optimization program which includes automated exposure control, adjustment of the mA and/or kV according to patient size and/or use of iterative reconstruction technique. CONTRAST:  32m OMNIPAQUE IOHEXOL 300 MG/ML  SOLN COMPARISON:  MR abdomen 06/29/2022 and CT abdomen pelvis 06/29/2022. FINDINGS: Cardiovascular: Atherosclerotic calcification of the aorta, aortic valve and coronary arteries. Heart is enlarged. No pericardial effusion. Mediastinum/Nodes: No pathologically enlarged mediastinal, hilar or axillary lymph nodes. Esophagus is grossly unremarkable. Small amount of ascitic fluid is seen above the diaphragmatic hiatus. Lungs/Pleura: Image quality is degraded by respiratory motion and expiratory phase imaging. Dependent atelectasis bilaterally. No suspicious pulmonary nodules. Trace bilateral pleural fluid. Airway is unremarkable. Upper Abdomen: Better evaluated on MR abdomen 06/29/2022. Persistent ascites and biliary ductal dilatation. Musculoskeletal: No worrisome lytic or sclerotic lesions. Old sternal fracture. IMPRESSION: 1. No evidence of metastatic disease in the chest. 2. Trace bilateral pleural fluid. 3. Upper abdomen better evaluated on MR abdomen 06/29/2022. 4. Aortic atherosclerosis (ICD10-I70.0). Coronary artery calcification. Electronically Signed   By: MLorin PicketM.D.   On: 07/02/2022 13:26        Scheduled Meds:  feeding supplement  237 mL Oral TID BM   folic acid  1 mg Intravenous Daily   multivitamin with minerals  1 tablet Oral Daily   thiamine  100 mg Oral Daily   Or   thiamine  100 mg Intravenous Daily   Continuous Infusions:  sodium chloride 10 mL/hr at 07/03/22 1121   cefTRIAXone (ROCEPHIN)  IV Stopped (07/02/22 1740)   lactated ringers 125 mL/hr at 07/03/22 1121   potassium chloride 10 mEq (07/03/22 1231)          KAline August MD [facility] 07/03/2022, 1:21  PM

## 2022-07-04 ENCOUNTER — Other Ambulatory Visit: Payer: Self-pay | Admitting: Oncology

## 2022-07-04 ENCOUNTER — Telehealth: Payer: Self-pay | Admitting: Hematology

## 2022-07-04 DIAGNOSIS — C259 Malignant neoplasm of pancreas, unspecified: Secondary | ICD-10-CM

## 2022-07-04 DIAGNOSIS — N139 Obstructive and reflux uropathy, unspecified: Secondary | ICD-10-CM | POA: Diagnosis not present

## 2022-07-04 DIAGNOSIS — G9340 Encephalopathy, unspecified: Secondary | ICD-10-CM | POA: Diagnosis not present

## 2022-07-04 DIAGNOSIS — K8689 Other specified diseases of pancreas: Secondary | ICD-10-CM

## 2022-07-04 DIAGNOSIS — R748 Abnormal levels of other serum enzymes: Secondary | ICD-10-CM | POA: Diagnosis not present

## 2022-07-04 DIAGNOSIS — N179 Acute kidney failure, unspecified: Secondary | ICD-10-CM | POA: Diagnosis not present

## 2022-07-04 LAB — CULTURE, BLOOD (ROUTINE X 2)
Culture: NO GROWTH
Culture: NO GROWTH

## 2022-07-04 LAB — COMPREHENSIVE METABOLIC PANEL
ALT: 51 U/L — ABNORMAL HIGH (ref 0–44)
AST: 67 U/L — ABNORMAL HIGH (ref 15–41)
Albumin: 2.2 g/dL — ABNORMAL LOW (ref 3.5–5.0)
Alkaline Phosphatase: 328 U/L — ABNORMAL HIGH (ref 38–126)
Anion gap: 8 (ref 5–15)
BUN: 6 mg/dL — ABNORMAL LOW (ref 8–23)
CO2: 24 mmol/L (ref 22–32)
Calcium: 7.9 mg/dL — ABNORMAL LOW (ref 8.9–10.3)
Chloride: 106 mmol/L (ref 98–111)
Creatinine, Ser: 0.71 mg/dL (ref 0.44–1.00)
GFR, Estimated: >60 mL/min (ref >60)
Glucose, Bld: 122 mg/dL — ABNORMAL HIGH (ref 70–99)
Potassium: 3.4 mmol/L — ABNORMAL LOW (ref 3.5–5.1)
Sodium: 138 mmol/L (ref 135–145)
Total Bilirubin: 4.8 mg/dL — ABNORMAL HIGH (ref 0.3–1.2)
Total Protein: 5.8 g/dL — ABNORMAL LOW (ref 6.5–8.1)

## 2022-07-04 LAB — MAGNESIUM: Magnesium: 1.7 mg/dL (ref 1.7–2.4)

## 2022-07-04 LAB — CBC WITH DIFFERENTIAL/PLATELET
Abs Immature Granulocytes: 0.06 10*3/uL (ref 0.00–0.07)
Basophils Absolute: 0.1 10*3/uL (ref 0.0–0.1)
Basophils Relative: 1 %
Eosinophils Absolute: 0.3 10*3/uL (ref 0.0–0.5)
Eosinophils Relative: 3 %
HCT: 26.7 % — ABNORMAL LOW (ref 36.0–46.0)
Hemoglobin: 8.9 g/dL — ABNORMAL LOW (ref 12.0–15.0)
Immature Granulocytes: 1 %
Lymphocytes Relative: 22 %
Lymphs Abs: 2.3 10*3/uL (ref 0.7–4.0)
MCH: 24.2 pg — ABNORMAL LOW (ref 26.0–34.0)
MCHC: 33.3 g/dL (ref 30.0–36.0)
MCV: 72.6 fL — ABNORMAL LOW (ref 80.0–100.0)
Monocytes Absolute: 1.2 10*3/uL — ABNORMAL HIGH (ref 0.1–1.0)
Monocytes Relative: 12 %
Neutro Abs: 6.5 10*3/uL (ref 1.7–7.7)
Neutrophils Relative %: 61 %
Platelets: 409 10*3/uL — ABNORMAL HIGH (ref 150–400)
RBC: 3.68 MIL/uL — ABNORMAL LOW (ref 3.87–5.11)
RDW: 23.7 % — ABNORMAL HIGH (ref 11.5–15.5)
WBC: 10.5 10*3/uL (ref 4.0–10.5)
nRBC: 0 % (ref 0.0–0.2)

## 2022-07-04 LAB — CK: Total CK: 224 U/L (ref 38–234)

## 2022-07-04 MED ORDER — POTASSIUM CHLORIDE 10 MEQ/100ML IV SOLN
10.0000 meq | INTRAVENOUS | Status: AC
  Administered 2022-07-04 (×6): 10 meq via INTRAVENOUS
  Filled 2022-07-04 (×6): qty 100

## 2022-07-04 MED ORDER — MAGNESIUM SULFATE 4 GM/100ML IV SOLN
4.0000 g | Freq: Once | INTRAVENOUS | Status: AC
Start: 2022-07-04 — End: 2022-07-04
  Administered 2022-07-04: 4 g via INTRAVENOUS
  Filled 2022-07-04: qty 100

## 2022-07-04 NOTE — Progress Notes (Signed)
PROGRESS NOTE    [name]  YWV:[id number] DOB: Apr 06, 1946 DOA: 06/28/2022 PCP: Lois Huxley, PA   Brief Narrative:  76 y.o. female with medical history significant of alcohol abuse, chronic pain with history of oxycodone overdose in the past, depression, asthma, GERD, hyperlipidemia, psoriasis, left renal cell carcinoma status post partial nephrectomy in 2012, history of cholecystectomy presented with suspected accidental oxycodone overdose as she was lethargic on initial evaluation.  On presentation, she had leukocytosis with elevated LFTs, T. bili of 8.2, ammonia 59.  Chest x-ray showed no active disease.  She was found to have pancreatic head mass along with biliary and pancreatic duct dilatation on imaging along with nonocclusive thrombus in the superior mesenteric vein with changes of possible cirrhosis in the liver and mild to moderate hydroureteronephrosis on the right with possible distal right ureter calculus or phlebolith.  GI urology and oncology were consulted.  She underwent EUS and ERCP with CBD stenting on 07/02/2022 by GI.    Assessment & Plan:   No diagnosis of pancreatic cancer with biliary and pancreatic duct dilatation with biliary stricture and elevated LFTs -Status post ERCP and EUS on 07/02/2022 and placement of metallic CBD stent by GI.  Pathology consistent with adenocarcinoma.  Oncology following and recommending outpatient follow-up with oncology for deciding neck steps. -Monitor LFTs.  Nonocclusive SMV thrombus -We will follow oncology recommendations regarding timing of anticoagulation  Rhabdomyolysis -Currently on IV fluids.  CK level was improved.  DC IV fluids.  Obstructive uropathy UTI: Present on admission -CT showed mild to moderate hydro ureteral nephrosis with possible distal right ureter calculus or phlebolith.  Urology evaluation appreciated: Since patient's hydronephrosis resolved after the bladder was empty, it was not probably due to ureteral  calculus but was possibly due to phlebolith.  Urology signed off.  No work-up needed. -Currently on Rocephin.  Finish 7-day course.  Urine culture grew E. coli.  Alcohol abuse -Continue CIWA protocol.  Continue thiamine, multivitamin and folic acid.  Hypokalemia-replace.  Repeat a.m. labs  Hypomagnesemia--improved   leukocytosis -resolved  Generalized deconditioning -PT eval pending.   DVT prophylaxis: SCDs Code Status: Full Family Communication: spoke to Copy on phone Disposition Plan: Status is: Inpatient Remains inpatient appropriate because: Of severity of illness  Consultants: GI/oncology/urology  Procedures: ERCP/EUS on 07/02/2022  Antimicrobials:  Anti-infectives (From admission, onward)    Start     Dose/Rate Route Frequency Ordered Stop   07/03/22 1700  cefTRIAXone (ROCEPHIN) 2 g in sodium chloride 0.9 % 100 mL IVPB        2 g 200 mL/hr over 30 Minutes Intravenous Every 24 hours 07/03/22 1350 07/07/22 1659   07/01/22 1700  cefTRIAXone (ROCEPHIN) 2 g in sodium chloride 0.9 % 100 mL IVPB  Status:  Discontinued        2 g 200 mL/hr over 30 Minutes Intravenous Every 24 hours 07/01/22 1341 07/03/22 1350   06/30/22 1659  ceFEPIme (MAXIPIME) 2 g in sodium chloride 0.9 % 100 mL IVPB  Status:  Discontinued        2 g 200 mL/hr over 30 Minutes Intravenous Every 8 hours 06/30/22 1132 07/01/22 1341   06/30/22 1600  vancomycin (VANCOCIN) IVPB 1000 mg/200 mL premix  Status:  Discontinued        1,000 mg 200 mL/hr over 60 Minutes Intravenous Every 24 hours 06/29/22 1551 06/30/22 1132   06/30/22 1600  vancomycin (VANCOREADY) IVPB 1250 mg/250 mL  Status:  Discontinued  1,250 mg 166.7 mL/hr over 90 Minutes Intravenous Every 24 hours 06/30/22 1132 07/01/22 1341   06/29/22 1645  ceFEPIme (MAXIPIME) 2 g in sodium chloride 0.9 % 100 mL IVPB  Status:  Discontinued        2 g 200 mL/hr over 30 Minutes Intravenous Every 12 hours 06/29/22 1551 06/30/22 1132    06/29/22 1645  vancomycin (VANCOREADY) IVPB 1250 mg/250 mL        1,250 mg 166.7 mL/hr over 90 Minutes Intravenous  Once 06/29/22 1551 06/29/22 1908   06/29/22 1645  metroNIDAZOLE (FLAGYL) IVPB 500 mg  Status:  Discontinued        500 mg 100 mL/hr over 60 Minutes Intravenous Every 12 hours 06/29/22 1551 07/01/22 1341   06/29/22 0730  cefTRIAXone (ROCEPHIN) 1 g in sodium chloride 0.9 % 100 mL IVPB  Status:  Discontinued        1 g 200 mL/hr over 30 Minutes Intravenous Every 24 hours 06/29/22 0717 06/29/22 1539        Subjective: Patient seen and examined at bedside.  Poor historian, still slow to respond. Oral intake is poor.  No overnight fever, agitation, seizures reported.  Objective: Vitals:   07/03/22 1316 07/03/22 2158 07/04/22 0500 07/04/22 0628  BP: 102/72 107/64  118/69  Pulse: 86 93  84  Resp: '18 18  19  '$ Temp: 97.9 F (36.6 C) 97.8 F (36.6 C)  98 F (36.7 C)  TempSrc: Oral     SpO2: 100% 100%  100%  Weight:   69.2 kg   Height:        Intake/Output Summary (Last 24 hours) at 07/04/2022 1157 Last data filed at 07/04/2022 0600 Gross per 24 hour  Intake 2322.17 ml  Output --  Net 2322.17 ml    Filed Weights   06/28/22 2249 06/29/22 1442 07/04/22 0500  Weight: 59 kg 62.1 kg 69.2 kg    Examination:  General: On room air.  No distress.  On room air.  Looks chronically ill and deconditioned. ENT/neck: No thyromegaly.  JVD is not elevated  respiratory: Decreased breath sounds at bases bilaterally with some crackles; no wheezing CVS: S1-S2 heard, rate controlled Abdominal: Soft, nontender, slightly distended; no organomegaly, normal bowel sounds are heard Extremities: Trace lower extremity edema; no cyanosis  CNS: Awake, still slow to respond.  Poor historian.  No focal neurologic deficit.  Moves extremities Lymph: No obvious lymphadenopathy Skin: No obvious ecchymosis/lesions  psych: Not agitated.  Affect is flat.   Musculoskeletal: No obvious joint  swelling/deformity    Data Reviewed: I have personally reviewed following labs and imaging studies  CBC: Recent Labs  Lab 06/29/22 0049 06/29/22 0705 06/30/22 0348 07/02/22 0421 07/04/22 0420  WBC 15.1* 15.0* 12.3* 11.4* 10.5  NEUTROABS 12.0*  --   --   --  6.5  HGB 10.0* 8.8* 8.6* 9.4* 8.9*  HCT 29.6* 27.0* 25.2* 27.5* 26.7*  MCV 73.8* 75.0* 72.0* 71.2* 72.6*  PLT 426* 421* 401* 365 409*    Basic Metabolic Panel: Recent Labs  Lab 06/29/22 0032 06/29/22 0705 06/30/22 0348 07/01/22 0342 07/02/22 0421 07/02/22 1646 07/03/22 0414 07/04/22 0420  NA 131*   < > 135 137 137  --  135 138  K 2.2*   < > 2.6* 2.9* 3.2* 3.8 3.4* 3.4*  CL 86*   < > 98 103 102  --  105 106  CO2 29   < > '28 24 24  '$ --  23 24  GLUCOSE 95   < >  86 89 91  --  132* 122*  BUN 21   < > 12 8 6*  --  7* 6*  CREATININE 1.20*   < > 0.56 0.46 0.43*  --  0.54 0.71  CALCIUM 8.5*   < > 7.9* 7.9* 7.6*  --  7.4* 7.9*  MG 2.0  --   --   --  1.0* 1.9 1.6* 1.7   < > = values in this interval not displayed.    GFR: Estimated Creatinine Clearance: 63.2 mL/min (by C-G formula based on SCr of 0.71 mg/dL). Liver Function Tests: Recent Labs  Lab 06/29/22 0032 06/29/22 0705 06/30/22 0348 07/01/22 0342 07/04/22 0420  AST 186* 212* 202* 192* 67*  ALT 74* 73* 70* 75* 51*  ALKPHOS 512* 481* 421* 380* 328*  BILITOT 8.2* 7.6* 8.3* 9.8* 4.8*  PROT 7.1 6.3* 5.7* 5.6* 5.8*  ALBUMIN 2.9* 2.7* 2.3* 2.2* 2.2*    Recent Labs  Lab 06/29/22 0032  LIPASE 49    Recent Labs  Lab 06/29/22 0133  AMMONIA 59*    Coagulation Profile: Recent Labs  Lab 06/29/22 0133  INR 1.4*    Cardiac Enzymes: Recent Labs  Lab 06/29/22 0032 06/29/22 0705 07/01/22 0342 07/04/22 0420  CKTOTAL 1,903* 3,101* 1,378* 224    BNP (last 3 results) No results for input(s): "PROBNP" in the last 8760 hours. HbA1C: No results for input(s): "HGBA1C" in the last 72 hours. CBG: Recent Labs  Lab 06/29/22 0031 07/03/22 1708  GLUCAP  102* 143*    Lipid Profile: No results for input(s): "CHOL", "HDL", "LDLCALC", "TRIG", "CHOLHDL", "LDLDIRECT" in the last 72 hours. Thyroid Function Tests: No results for input(s): "TSH", "T4TOTAL", "FREET4", "T3FREE", "THYROIDAB" in the last 72 hours. Anemia Panel: No results for input(s): "VITAMINB12", "FOLATE", "FERRITIN", "TIBC", "IRON", "RETICCTPCT" in the last 72 hours. Sepsis Labs: Recent Labs  Lab 06/29/22 0253 06/29/22 1502  LATICACIDVEN 1.1 2.5*     Recent Results (from the past 240 hour(s))  Culture, blood (routine x 2)     Status: None   Collection Time: 06/29/22  2:53 AM   Specimen: BLOOD  Result Value Ref Range Status   Specimen Description   Final    BLOOD RIGHT ANTECUBITAL Performed at [facility] [address] [place], [place] [address]    Special Requests   Final    BOTTLES DRAWN AEROBIC AND ANAEROBIC Blood Culture results may not be optimal due to an excessive volume of blood received in culture bottles Performed at [facility] [address] [place], [place] [address]    Culture   Final    NO GROWTH 5 DAYS Performed at [facility], [place] [address] [place], [place] [address]    Report Status 07/04/2022 FINAL  Final  Culture, blood (routine x 2)     Status: None   Collection Time: 06/29/22  2:53 AM   Specimen: BLOOD  Result Value Ref Range Status   Specimen Description   Final    BLOOD LEFT ANTECUBITAL Performed at [facility] [address], [place], [place] [address]    Special Requests   Final    BOTTLES DRAWN AEROBIC AND ANAEROBIC Blood Culture results may not be optimal due to an excessive volume of blood received in culture bottles Performed at [facility] [address], [place], [place] [address]    Culture   Final    NO GROWTH 5 DAYS Performed at [facility]  Lab, [address] [place], [place] [address]    Report Status 07/04/2022 FINAL   Final  Urine Culture     Status: Abnormal   Collection Time: 06/29/22  5:14 AM   Specimen: In/Out Cath Urine  Result Value Ref Range Status   Specimen Description   Final    IN/OUT CATH URINE Performed at [facility] [address] [place], [place] [address]    Special Requests   Final    NONE Performed at [facility], [place] [address], [place], [place] [address]    Culture >=100,000 COLONIES/mL ESCHERICHIA COLI (A)  Final   Report Status 07/01/2022 FINAL  Final   Organism ID, Bacteria ESCHERICHIA COLI (A)  Final      Susceptibility   Escherichia coli - MIC*    AMPICILLIN <=2 SENSITIVE Sensitive     CEFAZOLIN <=4 SENSITIVE Sensitive     CEFEPIME <=0.12 SENSITIVE Sensitive     CEFTRIAXONE <=0.25 SENSITIVE Sensitive     CIPROFLOXACIN <=0.25 SENSITIVE Sensitive     GENTAMICIN <=1 SENSITIVE Sensitive     IMIPENEM <=0.25 SENSITIVE Sensitive     NITROFURANTOIN <=16 SENSITIVE Sensitive     TRIMETH/SULFA <=20 SENSITIVE Sensitive     AMPICILLIN/SULBACTAM <=2 SENSITIVE Sensitive     PIP/TAZO <=4 SENSITIVE Sensitive     * >=100,000 COLONIES/mL ESCHERICHIA COLI         Radiology Studies: DG ERCP  Result Date: 07/02/2022 CLINICAL DATA:  Pancreatic cancer. EXAM: ERCP COMPARISON:  CT AP, 06/29/2022.  MRCP, 06/29/2022. FLUOROSCOPY: Exposure Index (as provided by the fluoroscopic device): 32.5 mGy Kerma FINDINGS: Multiple, limited oblique planar images of the RIGHT upper quadrant obtained C-arm. Images demonstrating flexible endoscopy, biliary duct cannulation, retrograde cholangiogram and metallic biliary stent placement. Marked extrahepatic biliary ductal dilation and potential CBD occlusion, with a relative decompressed appearance after metallic biliary stent placement. IMPRESSION: Fluoroscopic imaging for ERCP and metallic biliary stent placement. Marked extrahepatic biliary ductal dilation, relatively decompressed after stent placement. For complete  description of intra procedural findings, please see performing service dictation. Electronically Signed   By: Michaelle Birks M.D.   On: 07/02/2022 17:07   DG C-Arm 1-60 Min-No Report  Result Date: 07/02/2022 Fluoroscopy was utilized by the requesting physician.  No radiographic interpretation.   CT CHEST W CONTRAST  Result Date: 07/02/2022 CLINICAL DATA:  Pancreatic cancer staging.  * Tracking Code: BO * EXAM: CT CHEST WITH CONTRAST TECHNIQUE: Multidetector CT imaging of the chest was performed during intravenous contrast administration. RADIATION DOSE REDUCTION: This exam was performed according to the departmental dose-optimization program which includes automated exposure control, adjustment of the mA and/or kV according to patient size and/or use of iterative reconstruction technique. CONTRAST:  62m OMNIPAQUE IOHEXOL 300 MG/ML  SOLN COMPARISON:  MR abdomen 06/29/2022 and CT abdomen pelvis 06/29/2022. FINDINGS: Cardiovascular: Atherosclerotic calcification of the aorta, aortic valve and coronary arteries. Heart is enlarged. No pericardial effusion. Mediastinum/Nodes: No pathologically enlarged mediastinal, hilar or axillary lymph nodes. Esophagus is grossly unremarkable. Small amount of ascitic fluid is seen above the diaphragmatic hiatus. Lungs/Pleura: Image quality is degraded by respiratory motion and expiratory phase imaging. Dependent atelectasis bilaterally. No suspicious pulmonary nodules. Trace bilateral pleural fluid. Airway is unremarkable. Upper Abdomen: Better evaluated on MR abdomen 06/29/2022. Persistent ascites and biliary ductal dilatation. Musculoskeletal: No worrisome lytic or sclerotic lesions. Old sternal fracture. IMPRESSION: 1. No evidence of metastatic disease in the chest. 2. Trace bilateral pleural fluid. 3. Upper abdomen better evaluated on  MR abdomen 06/29/2022. 4. Aortic atherosclerosis (ICD10-I70.0). Coronary artery calcification. Electronically Signed   By: Lorin Picket  M.D.   On: 07/02/2022 13:26        Scheduled Meds:  feeding supplement  237 mL Oral TID BM   folic acid  1 mg Intravenous Daily   multivitamin with minerals  1 tablet Oral Daily   thiamine  100 mg Oral Daily   Or   thiamine  100 mg Intravenous Daily   Continuous Infusions:  sodium chloride Stopped (07/03/22 1631)   cefTRIAXone (ROCEPHIN)  IV Stopped (07/03/22 1705)   lactated ringers 75 mL/hr at 07/04/22 0754   potassium chloride 10 mEq (07/04/22 1135)          Aline August, MD [facility] 07/04/2022, 11:57 AM

## 2022-07-04 NOTE — TOC Progression Note (Signed)
Transition of Care [facility]) - Progression Note    Patient Details  Name: [name] MRN: [id number] Date of Birth: July 02, 1946  Transition of Care [facility]) CM/SW [place], RN Phone Number: 07/04/2022, 4:23 PM  Clinical Narrative:  Spoke with patient offered choice for home health services and rolling walker.  HHPT: notified Claiborne Billings with [facility], will follow patient for hh services DME: rolling walker, Danielle with [employer] notified will deliver to patient's room tomorrow am.  Patient agreed to receiving substance abuse resource information, attached to avs.  Patient's daughter will transport at discharge.  TOC will continue to follow.   Expected Discharge Plan: Home/Self Care Barriers to Discharge: No Barriers Identified  Expected Discharge Plan and Services Expected Discharge Plan: Home/Self Care In-house Referral: NA Discharge Planning Services: CM Consult Post Acute Care Choice: [place] arrangements for the past 2 months: Single Family Home                   DME Agency: [employer] Date DME Agency Contacted: 07/04/22 Time DME Agency Contacted: [phone] Representative spoke with at DME Agency: [place]: PT [place]: [place] Date [place]: 07/04/22 Time [place]: 1623 Representative spoke with at [place]: [place] Determinants of Health ([place]) Interventions    Readmission Risk Interventions     No data to display

## 2022-07-04 NOTE — Evaluation (Signed)
Physical Therapy Evaluation Patient Details Name: AVEY MCMANAMON MRN: [id number] DOB: 11-30-1945 Today's Date: 07/04/2022  History of Present Illness  76 y.o. female with medical history significant of alcohol abuse, chronic pain with history of oxycodone overdose in the past, depression, asthma, GERD, hyperlipidemia, psoriasis, left renal cell carcinoma status post partial nephrectomy in 2012, history of cholecystectomy presented with suspected accidental oxycodone overdose as she was lethargic on initial evaluation.  On presentation, she had leukocytosis with elevated LFTs,  She was found to have pancreatic head mass. Dx of acute encephalopathy, adenocarcinoma.  Clinical Impression  Pt admitted with above diagnosis. Pt ambulated 66' with RW, distance limited by fatigue. HHPT and supervision for mobility recommended.  Pt currently with functional limitations due to the deficits listed below (see PT Problem List). Pt will benefit from skilled PT to increase their independence and safety with mobility to allow discharge to the venue listed below.          Recommendations for follow up therapy are one component of a multi-disciplinary discharge planning process, led by the attending physician.  Recommendations may be updated based on patient status, additional functional criteria and insurance authorization.  Follow Up Recommendations Home health PT      Assistance Recommended at Discharge Intermittent Supervision/Assistance  Patient can return home with the following  A little help with walking and/or transfers;A little help with bathing/dressing/bathroom;Assistance with cooking/housework;Direct supervision/assist for medications management;Assist for transportation;Help with stairs or ramp for entrance;Direct supervision/assist for financial management    Equipment Recommendations Rolling walker (2 wheels)  Recommendations for Other Services       Functional Status Assessment Patient has had a  recent decline in their functional status and demonstrates the ability to make significant improvements in function in a reasonable and predictable amount of time.     Precautions / Restrictions Precautions Precautions: Fall Precaution Comments: denies falls in past 6 months Restrictions Weight Bearing Restrictions: No      Mobility  Bed Mobility Overal bed mobility: Modified Independent             General bed mobility comments: used bedrail, HOB up    Transfers Overall transfer level: Needs assistance Equipment used: 1 person hand held assist Transfers: Sit to/from Stand Sit to Stand: Min guard           General transfer comment: min/guard for safety, mild unsteadiness but no LOB    Ambulation/Gait Ambulation/Gait assistance: Min assist Gait Distance (Feet): 80 Feet Assistive device: IV Pole, 1 person hand held assist Gait Pattern/deviations: Step-through pattern, Decreased step length - right, Decreased step length - left, Trunk flexed Gait velocity: decr     General Gait Details: Pt forward flexed at hips (unclear if this is baseline, pt poor historian and no family present), declined RW but reached for support from IV pole and from therapist  Stairs            Wheelchair Mobility    Modified Rankin (Stroke Patients Only)       Balance Overall balance assessment: Needs assistance Sitting-balance support: Feet supported, No upper extremity supported Sitting balance-Leahy Scale: Good       Standing balance-Leahy Scale: Fair Standing balance comment: relies on BUE support when walking                             Pertinent Vitals/Pain Pain Assessment Pain Assessment: No/denies pain    Home Living Family/patient expects to be  discharged to:: Private residence Living Arrangements: Children Available Help at Discharge: Family Type of Home: House Home Access: [place]: One [place]:  Radio producer - single point Additional Comments: lives with daughter who works nights, family plans to arrange 24* assist    Prior Function Prior Level of Function : Independent/Modified Independent             Mobility Comments: walks without AD, denies falls in past 6 months       Hand Dominance        Extremity/Trunk Assessment   Upper Extremity Assessment Upper Extremity Assessment: Overall WFL for tasks assessed    Lower Extremity Assessment Lower Extremity Assessment: Overall WFL for tasks assessed    Cervical / Trunk Assessment Cervical / Trunk Assessment: Normal  Communication   Communication: No difficulties  Cognition Arousal/Alertness: Awake/alert Behavior During Therapy: WFL for tasks assessed/performed Overall Cognitive Status: No family/caregiver present to determine baseline cognitive functioning                                 General Comments: oriented to self only, can follow 1 step commands        General Comments      Exercises     Assessment/Plan    PT Assessment Patient needs continued PT services  PT Problem List Decreased activity tolerance;Decreased balance;Decreased mobility       PT Treatment Interventions Gait training;Therapeutic exercise;Functional mobility training;Balance training;Patient/family education;Therapeutic activities    PT Goals (Current goals can be found in the Care Plan section)  Acute Rehab PT Goals PT Goal Formulation: Patient unable to participate in goal setting Time For Goal Achievement: 07/18/22 Potential to Achieve Goals: Fair    Frequency Min 3X/week     Co-evaluation               AM-PAC PT "6 Clicks" Mobility  Outcome Measure Help needed turning from your back to your side while in a flat bed without using bedrails?: A Little Help needed moving from lying on your back to sitting on the side of a flat bed without using bedrails?: A Little Help needed moving to and  from a bed to a chair (including a wheelchair)?: A Little Help needed standing up from a chair using your arms (e.g., wheelchair or bedside chair)?: A Little Help needed to walk in hospital room?: A Little Help needed climbing 3-5 steps with a railing? : A Lot 6 Click Score: 17    End of Session Equipment Utilized During Treatment: Gait belt Activity Tolerance: Patient tolerated treatment well Patient left: in chair;with chair alarm set;with call bell/phone within reach Nurse Communication: Mobility status PT Visit Diagnosis: Difficulty in walking, not elsewhere classified (R26.2)    Time: 6789-3810 PT Time Calculation (min) (ACUTE ONLY): 16 min   Charges:   PT Evaluation $PT Eval Moderate Complexity: 1 Mod        Philomena Doheny PT 07/04/2022  Acute Rehabilitation Services  Office [phone]

## 2022-07-04 NOTE — Progress Notes (Signed)
PT Cancellation Note  Patient Details Name: [name] MRN: [id number] DOB: [birth year]   Cancelled Treatment:    Reason Eval/Treat Not Completed: Fatigue/lethargy limiting ability to participate (pt requested PT check back later, she is very tired and wants to nap. Will follow.)  Philomena Doheny PT 07/04/2022  Acute Rehabilitation Services  Office [phone]

## 2022-07-04 NOTE — Plan of Care (Signed)

## 2022-07-04 NOTE — Telephone Encounter (Signed)
Scheduled appt per 8/24 referral. Attempted to call pt using both numbers in the chart, no answer and no vm available on either one.

## 2022-07-05 ENCOUNTER — Other Ambulatory Visit: Payer: Self-pay | Admitting: Oncology

## 2022-07-05 DIAGNOSIS — N179 Acute kidney failure, unspecified: Secondary | ICD-10-CM | POA: Diagnosis not present

## 2022-07-05 DIAGNOSIS — N139 Obstructive and reflux uropathy, unspecified: Secondary | ICD-10-CM | POA: Diagnosis not present

## 2022-07-05 DIAGNOSIS — C259 Malignant neoplasm of pancreas, unspecified: Secondary | ICD-10-CM

## 2022-07-05 DIAGNOSIS — R748 Abnormal levels of other serum enzymes: Secondary | ICD-10-CM | POA: Diagnosis not present

## 2022-07-05 DIAGNOSIS — G9340 Encephalopathy, unspecified: Secondary | ICD-10-CM | POA: Diagnosis not present

## 2022-07-05 LAB — COMPREHENSIVE METABOLIC PANEL
ALT: 50 U/L — ABNORMAL HIGH (ref 0–44)
AST: 58 U/L — ABNORMAL HIGH (ref 15–41)
Albumin: 2.5 g/dL — ABNORMAL LOW (ref 3.5–5.0)
Alkaline Phosphatase: 329 U/L — ABNORMAL HIGH (ref 38–126)
Anion gap: 7 (ref 5–15)
BUN: <5 mg/dL — ABNORMAL LOW (ref 8–23)
CO2: 22 mmol/L (ref 22–32)
Calcium: 8.5 mg/dL — ABNORMAL LOW (ref 8.9–10.3)
Chloride: 108 mmol/L (ref 98–111)
Creatinine, Ser: 0.52 mg/dL (ref 0.44–1.00)
GFR, Estimated: >60 mL/min (ref >60)
Glucose, Bld: 106 mg/dL — ABNORMAL HIGH (ref 70–99)
Potassium: 3.9 mmol/L (ref 3.5–5.1)
Sodium: 137 mmol/L (ref 135–145)
Total Bilirubin: 4.8 mg/dL — ABNORMAL HIGH (ref 0.3–1.2)
Total Protein: 6.4 g/dL — ABNORMAL LOW (ref 6.5–8.1)

## 2022-07-05 LAB — MAGNESIUM: Magnesium: 1.7 mg/dL (ref 1.7–2.4)

## 2022-07-05 MED ORDER — CHOLESTYRAMINE 4 G PO PACK: 4.0000 g | PACK | ORAL | Status: AC

## 2022-07-05 MED ORDER — LOPERAMIDE HCL 2 MG PO CAPS
2.0000 mg | ORAL_CAPSULE | Freq: Four times a day (QID) | ORAL | Status: DC | PRN
  Administered 2022-07-05: 2 mg via ORAL
  Filled 2022-07-05: qty 1

## 2022-07-05 MED ORDER — CYCLOBENZAPRINE HCL 10 MG PO TABS: 10.0000 mg | ORAL_TABLET | Freq: Two times a day (BID) | ORAL | Status: AC | PRN

## 2022-07-05 MED ORDER — FERROUS SULFATE 325 (65 FE) MG PO TABS: 325.0000 mg | ORAL_TABLET | Freq: Three times a day (TID) | ORAL | Status: AC

## 2022-07-05 MED ORDER — SODIUM CHLORIDE 0.9 % IV SOLN
510.0000 mg | Freq: Once | INTRAVENOUS | Status: AC
  Administered 2022-07-05: 510 mg via INTRAVENOUS
  Filled 2022-07-05: qty 17

## 2022-07-05 NOTE — Progress Notes (Signed)
Daughter Ang[place] called and made aware of patient being discharged to home. AVS and discharge instructions reviewed w/ patient and patient's brother at the beside. Roller walker delivered to bedside for at home use. Patient will be transported to home via brother.

## 2022-07-05 NOTE — Progress Notes (Signed)
Referral, demographics, insurance information, and Dr Grier Mitts consult note faxed to [facility] [phone]

## 2022-07-05 NOTE — Progress Notes (Signed)
HEMATOLOGY-ONCOLOGY PROGRESS NOTE  ASSESSMENT AND PLAN: 1) pancreatic adenocarcinoma -CT abdomen/pelvis with contrast 06/29/2022- "1. Hypodense masslike region in the pancreatic head with surrounding fat stranding measuring 6.5 x 4.8 x 6.5 cm resulting in biliary and pancreatic duct ductal dilatation. Findings are concerning for pancreatic adenocarcinoma versus pancreatitis with developing abscess. Correlation with amylase and lipase and CA 19-9 is recommended. MRI with contrast is suggested for further evaluation. 2. 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 3. Morphologic changes of cirrhosis in the liver. 4. Mild-to-moderate hydroureteronephrosis on the right with ureteral enhancement. A 3 mm calculus is present in the anticipated region of the distal right ureter, possible ureteral calculus or phlebolith. Clinical correlation is recommended to exclude superimposed infection. 5. Aortic atherosclerosis." -MRCP 06/29/2022- "1. 6.5 x 5.8 x 4.9 cm rim enhancing mass in the anterior pancreatic head is contiguous with the posterior wall the duodenum bulb and medial wall of the descending duodenum. Imaging features suggest direct invasion of the medial wall of the descending duodenum. Primary pancreatic adenocarcinoma would be distinct consideration. Primary duodenal neoplasm with secondary involvement of the pancreatic head is considered less likely but not excluded. Lesion probably abuts the inferior aspect of the portal vein. 2. 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 3. Intrahepatic and extrahepatic biliary duct dilatation with abrupt cut off of the common bile duct just proximal to the ampulla. This is associated with mild diffuse dilatation of the main pancreatic duct. 4. Mass lesion compresses the IVC posteriorly against the spine. IVC involvement not excluded. 5. No evidence for hepatic metastases nor  metastatic  lymphadenopathy in the abdomen. 6. Trace free fluid around the liver." -CA 19.9 performed on 06/29/2022- 19 -CEA performed 07/02/2022-28.7 -CT chest with contrast 07/02/2022- "1. No evidence of metastatic disease in the chest. 2. Trace bilateral pleural fluid. 3. Upper abdomen better evaluated on MR abdomen 06/29/2022. 4. Aortic atherosclerosis (ICD10-I70.0). Coronary artery calcification."   2) iron deficiency anemia -Labs from 06/29/2022-ferritin 9, iron 23, percent saturation 7% -07/01/2022 stool for occult blood-positive   3) nonocclusive SMV thrombus   4) cirrhosis   5) hydroureteronephrosis   6) alcohol and polysubstance abuse   7) acute metabolic encephalopathy  PLAN: -Discussed biopsy results with the patient today.  We will have further discussion regarding systemic treatment options as an outpatient.  We will need to reevaluate her performance status as an outpatient. -She has no evidence of distant metastatic disease.  Outpatient referral to general surgery to determine if she will be a candidate for surgery. -IV iron recommended due to iron deficiency anemia.  Not yet given.  I have ordered 1 dose of Feraheme 510 mg IV. -Noted to have nonocclusive SMV thrombus on imaging.  Based on endoscopy results and the fact that she has heme positive stools, anticoagulation not recommended.  Mikey Bussing  SUBJECTIVE: The patient is more alert today.  Has no specific complaints.  Hoping to go home soon.  No family at the bedside.  REVIEW OF SYSTEMS:   Review of Systems  Constitutional:  Negative for fever.  Respiratory: Negative.    Cardiovascular: Negative.   Gastrointestinal: Negative.   Skin: Negative.   Neurological: Negative.      PHYSICAL EXAMINATION: ECOG PERFORMANCE STATUS: 2 - Symptomatic, <50% confined to bed  Vitals:   07/04/22 1940 07/05/22 0430  BP: 115/66 125/65  Pulse: 97 87  Resp: 20 20  Temp: 99.4 F (37.4 C) 99.4 F (  37.4 C)  SpO2: 99%  100%   Filed Weights   06/29/22 1442 07/04/22 0500 07/05/22 0500  Weight: 62.1 kg 69.2 kg 69.4 kg    Intake/Output from previous day: 08/24 0701 - 08/25 0700 In: 2945.6 [P.O.:720; I.V.:1525.7; IV Piggyback:699.9] Out: 500 [Stool:500]  Physical Exam Vitals reviewed.  Constitutional:      General: She is not in acute distress. Pulmonary:     Effort: Pulmonary effort is normal. No respiratory distress.  Skin:    General: Skin is warm and dry.  Neurological:     Mental Status: She is alert. Mental status is at baseline.    LABORATORY DATA:  I have reviewed the data as listed    Latest Ref Rng & Units 07/05/2022    4:14 AM 07/04/2022    4:20 AM 07/03/2022    4:14 AM  CMP  Glucose 70 - 99 mg/dL 106  122  132   BUN 8 - 23 mg/dL '5  6  7   '$ Creatinine 0.44 - 1.00 mg/dL 0.52  0.71  0.54   Sodium 135 - 145 mmol/L 137  138  135   Potassium 3.5 - 5.1 mmol/L 3.9  3.4  3.4   Chloride 98 - 111 mmol/L 108  106  105   CO2 22 - 32 mmol/L '22  24  23   '$ Calcium 8.9 - 10.3 mg/dL 8.5  7.9  7.4   Total Protein 6.5 - 8.1 g/dL 6.4  5.8    Total Bilirubin 0.3 - 1.2 mg/dL 4.8  4.8    Alkaline Phos 38 - 126 U/L 329  328    AST 15 - 41 U/L 58  67    ALT 0 - 44 U/L 50  51      Lab Results  Component Value Date   WBC 10.5 07/04/2022   HGB 8.9 (L) 07/04/2022   HCT 26.7 (L) 07/04/2022   MCV 72.6 (L) 07/04/2022   PLT 409 (H) 07/04/2022   NEUTROABS 6.5 07/04/2022    Lab Results  Component Value Date   CEA1 28.7 (H) 07/02/2022   KLK917 19 06/29/2022    DG ERCP  Result Date: 07/02/2022 CLINICAL DATA:  Pancreatic cancer. EXAM: ERCP COMPARISON:  CT AP, 06/29/2022.  MRCP, 06/29/2022. FLUOROSCOPY: Exposure Index (as provided by the fluoroscopic device): 32.5 mGy Kerma FINDINGS: Multiple, limited oblique planar images of the RIGHT upper quadrant obtained C-arm. Images demonstrating flexible endoscopy, biliary duct cannulation, retrograde cholangiogram and metallic biliary stent placement. Marked  extrahepatic biliary ductal dilation and potential CBD occlusion, with a relative decompressed appearance after metallic biliary stent placement. IMPRESSION: Fluoroscopic imaging for ERCP and metallic biliary stent placement. Marked extrahepatic biliary ductal dilation, relatively decompressed after stent placement. For complete description of intra procedural findings, please see performing service dictation. Electronically Signed   By: Michaelle Birks M.D.   On: 07/02/2022 17:07   DG C-Arm 1-60 Min-No Report  Result Date: 07/02/2022 Fluoroscopy was utilized by the requesting physician.  No radiographic interpretation.   CT CHEST W CONTRAST  Result Date: 07/02/2022 CLINICAL DATA:  Pancreatic cancer staging.  * Tracking Code: BO * EXAM: CT CHEST WITH CONTRAST TECHNIQUE: Multidetector CT imaging of the chest was performed during intravenous contrast administration. RADIATION DOSE REDUCTION: This exam was performed according to the departmental dose-optimization program which includes automated exposure control, adjustment of the mA and/or kV according to patient size and/or use of iterative reconstruction technique. CONTRAST:  91m OMNIPAQUE IOHEXOL 300 MG/ML  SOLN COMPARISON:  MR abdomen 06/29/2022 and CT abdomen pelvis 06/29/2022. FINDINGS: Cardiovascular: Atherosclerotic calcification of the aorta, aortic valve and coronary arteries. Heart is enlarged. No pericardial effusion. Mediastinum/Nodes: No pathologically enlarged mediastinal, hilar or axillary lymph nodes. Esophagus is grossly unremarkable. Small amount of ascitic fluid is seen above the diaphragmatic hiatus. Lungs/Pleura: Image quality is degraded by respiratory motion and expiratory phase imaging. Dependent atelectasis bilaterally. No suspicious pulmonary nodules. Trace bilateral pleural fluid. Airway is unremarkable. Upper Abdomen: Better evaluated on MR abdomen 06/29/2022. Persistent ascites and biliary ductal dilatation. Musculoskeletal: No  worrisome lytic or sclerotic lesions. Old sternal fracture. IMPRESSION: 1. No evidence of metastatic disease in the chest. 2. Trace bilateral pleural fluid. 3. Upper abdomen better evaluated on MR abdomen 06/29/2022. 4. Aortic atherosclerosis (ICD10-I70.0). Coronary artery calcification. Electronically Signed   By: Lorin Picket M.D.   On: 07/02/2022 13:26   MR ABDOMEN MRCP W WO CONTAST  Result Date: 06/29/2022 CLINICAL DATA:  Pancreatic head mass on CT scan earlier same day. EXAM: MRI ABDOMEN WITHOUT AND WITH CONTRAST (INCLUDING MRCP) TECHNIQUE: Multiplanar multisequence MR imaging of the abdomen was performed both before and after the administration of intravenous contrast. Heavily T2-weighted images of the biliary and pancreatic ducts were obtained, and three-dimensional MRCP images were rendered by post processing. CONTRAST:  19m GADAVIST GADOBUTROL 1 MMOL/ML IV SOLN COMPARISON:  CT scan abdomen/pelvis 06/29/2022 FINDINGS: Lower chest: Unremarkable. Hepatobiliary: No suspicious focal abnormality within the liver parenchyma. Gallbladder surgically absent. Mild intrahepatic biliary duct dilatation is associated with 16 mm diameter common duct in the porta hepatis and dilatation of the cystic duct and common bile duct abruptly tapering just proximal to the ampulla. Pancreas: 6.5 x 5.8 x 4.9 cm rim enhancing ill-defined mass is identified in the anterior pancreatic head. Mild diffuse dilatation of the main pancreatic duct although mass lesion is anterior to the common bile duct and main pancreatic duct. Lesion probably abuts the inferior aspect portal vein and coronal imaging shows lateral margin of the lesion indistinguishable from the medial wall of the descending duodenum (see coronal 16/12). 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  Spleen: No splenomegaly. No focal mass lesion. Adrenals/Urinary Tract: No adrenal nodule or mass. Kidneys unremarkable. Stomach/Bowel: Stomach is nondistended. Although assessment is motion degraded, the anterior pancreatic head mass is contiguous with the posterior wall the duodenum and medial wall of the descending duodenum. Coronal T2 imaging is highly suspicious for tumor invasion of the medial wall of the descending duodenum (see axial T2 image 30 of series 2 and axial T2 haste image 29 series 16. This region is not well evaluated on postcontrast imaging due to motion artifact but appearance is similar suggesting duodenal invasion. Vascular/Lymphatic: Other:  Trace free fluid noted around the liver. Musculoskeletal: No focal suspicious marrow enhancement within the visualized bony anatomy. Skip that trace free fluid noted around the liver IMPRESSION: 1. 6.5 x 5.8 x 4.9 cm rim enhancing mass in the anterior pancreatic head is contiguous with the posterior wall the duodenum bulb and medial wall of the descending duodenum. Imaging features suggest direct invasion of the medial wall of the descending duodenum. Primary pancreatic adenocarcinoma would be distinct consideration. Primary duodenal neoplasm with secondary involvement of the pancreatic head is considered less likely but not excluded. Lesion probably abuts the inferior aspect of the portal vein. 2. Nonocclusive thrombus in the superior mesenteric  vein near the portal splenic confluence. 3. Intrahepatic and extrahepatic biliary duct dilatation with abrupt cut off of the common bile duct just proximal to the ampulla. This is associated with mild diffuse dilatation of the main pancreatic duct. 4. Mass lesion compresses the IVC posteriorly against the spine. IVC involvement not excluded. 5. No evidence for hepatic metastases nor metastatic lymphadenopathy in the abdomen. 6. Trace free fluid around the liver. Electronically Signed   By: Misty Stanley M.D.   On:  06/29/2022 12:08   MR 3D Recon At Scanner  Result Date: 06/29/2022 CLINICAL DATA:  Pancreatic head mass on CT scan earlier same day. EXAM: MRI ABDOMEN WITHOUT AND WITH CONTRAST (INCLUDING MRCP) TECHNIQUE: Multiplanar multisequence MR imaging of the abdomen was performed both before and after the administration of intravenous contrast. Heavily T2-weighted images of the biliary and pancreatic ducts were obtained, and three-dimensional MRCP images were rendered by post processing. CONTRAST:  10m GADAVIST GADOBUTROL 1 MMOL/ML IV SOLN COMPARISON:  CT scan abdomen/pelvis 06/29/2022 FINDINGS: Lower chest: Unremarkable. Hepatobiliary: No suspicious focal abnormality within the liver parenchyma. Gallbladder surgically absent. Mild intrahepatic biliary duct dilatation is associated with 16 mm diameter common duct in the porta hepatis and dilatation of the cystic duct and common bile duct abruptly tapering just proximal to the ampulla. Pancreas: 6.5 x 5.8 x 4.9 cm rim enhancing ill-defined mass is identified in the anterior pancreatic head. Mild diffuse dilatation of the main pancreatic duct although mass lesion is anterior to the common bile duct and main pancreatic duct. Lesion probably abuts the inferior aspect portal vein and coronal imaging shows lateral margin of the lesion indistinguishable from the medial wall of the descending duodenum (see coronal 16/12). 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 Spleen: No splenomegaly. No focal mass lesion. Adrenals/Urinary Tract: No adrenal nodule or mass. Kidneys unremarkable. Stomach/Bowel: Stomach is nondistended. Although assessment is motion degraded, the anterior pancreatic head mass is contiguous with the posterior wall the duodenum and medial wall of the descending duodenum. Coronal T2 imaging  is highly suspicious for tumor invasion of the medial wall of the descending duodenum (see axial T2 image 30 of series 2 and axial T2 haste image 29 series 16. This region is not well evaluated on postcontrast imaging due to motion artifact but appearance is similar suggesting duodenal invasion. Vascular/Lymphatic: Other:  Trace free fluid noted around the liver. Musculoskeletal: No focal suspicious marrow enhancement within the visualized bony anatomy. Skip that trace free fluid noted around the liver IMPRESSION: 1. 6.5 x 5.8 x 4.9 cm rim enhancing mass in the anterior pancreatic head is contiguous with the posterior wall the duodenum bulb and medial wall of the descending duodenum. Imaging features suggest direct invasion of the medial wall of the descending duodenum. Primary pancreatic adenocarcinoma would be distinct consideration. Primary duodenal neoplasm with secondary involvement of the pancreatic head is considered less likely but not excluded. Lesion probably abuts the inferior aspect of the portal vein. 2. 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 3. Intrahepatic and extrahepatic biliary duct dilatation with abrupt cut off of the common bile duct just proximal to the ampulla. This is associated with mild diffuse dilatation of the main pancreatic duct. 4. Mass lesion compresses the IVC posteriorly against the spine. IVC involvement not excluded. 5. No evidence for hepatic metastases nor metastatic  lymphadenopathy in the abdomen. 6. Trace free fluid around the liver. Electronically Signed   By: Misty Stanley M.D.   On: 06/29/2022 12:08   CT Abdomen Pelvis W Contrast  Result Date: 06/29/2022 CLINICAL DATA:  Found on floor beside bed, left hip pain. EXAM: CT ABDOMEN AND PELVIS WITH CONTRAST TECHNIQUE: Multidetector CT imaging of the abdomen and pelvis was performed using the standard protocol following bolus administration of intravenous contrast. RADIATION  DOSE REDUCTION: This exam was performed according to the departmental dose-optimization program which includes automated exposure control, adjustment of the mA and/or kV according to patient size and/or use of iterative reconstruction technique. CONTRAST:  65m OMNIPAQUE IOHEXOL 300 MG/ML  SOLN COMPARISON:  10/06/2019. FINDINGS: Lower chest: Atelectasis is present at the lung bases. Hepatobiliary: There is mild nodularity of the liver. No focal abnormality is identified. Intrahepatic and extrahepatic biliary ductal dilatation is noted. The common bile duct measures up to 1.6 cm in diameter. The gallbladder is surgically absent. Pancreas: Hypodense masslike region is present in the pancreatic head measuring 6.5 x 4.8 x 6.5 cm. There is dilatation of the pancreatic duct measuring up to 6 mm. Peripancreatic fat stranding is noted at the head. Spleen: Normal in size without focal abnormality. Adrenals/Urinary Tract: No adrenal nodule or mass. The kidneys enhance symmetrically. A punctate calculus is noted in the upper pole of the right kidney. There is mild-to-moderate hydroureteronephrosis on the right with urothelial enhancement with a 3 mm stone in the anticipated region of the distal right ureter, axial image 67 possible ureteral calculus or phlebolith. The bladder is within normal limits. Stomach/Bowel: Stomach is within normal limits. There is bowel wall thickening involving the first second and third portions of the duodenum. No bowel obstruction, free air, or pneumatosis. The appendix is not seen. Vascular/Lymphatic: The portal vein and splenic vein are within normal limits. Nonocclusive thrombus is present in the superior mesenteric vein at the level of the pancreatic lesion. Aortic atherosclerosis without evidence of aneurysm. No abdominal or pelvic lymphadenopathy. Reproductive: Status post hysterectomy. No adnexal masses. Other: Trace amount of perihepatic fluid is noted. 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 Fixation hardware is noted in the proximal left femur. Degenerative changes are present in the thoracolumbar spine. Stable compression deformities are present in the superior endplates at L1 and L2. Severe degenerative changes are noted at L4-L5 and L5-S1, unchanged from 2020. No acute osseous abnormality. IMPRESSION: 1. Hypodense masslike region in the pancreatic head with surrounding fat stranding measuring 6.5 x 4.8 x 6.5 cm resulting in biliary and pancreatic duct ductal dilatation. Findings are concerning for pancreatic adenocarcinoma versus pancreatitis with developing abscess. Correlation with amylase and lipase and CA 19-9 is recommended. MRI with contrast is suggested for further evaluation. 2. 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 3. Morphologic changes of cirrhosis in the liver. 4. Mild-to-moderate hydroureteronephrosis on the right with ureteral enhancement. A 3 mm calculus is present in the anticipated region of the distal right ureter, possible ureteral calculus or phlebolith. Clinical correlation is recommended to exclude superimposed infection. 5. Aortic atherosclerosis. Electronically Signed   By: LBrett FairyM.D.   On: 06/29/2022 04:15   CT Cervical Spine Wo Contrast  Result Date: 06/29/2022 CLINICAL DATA:  Trauma. EXAM: CT HEAD WITHOUT CONTRAST CT CERVICAL SPINE WITHOUT CONTRAST TECHNIQUE: Multidetector CT imaging of the head and cervical spine was performed following the standard protocol without intravenous contrast. Multiplanar CT  image reconstructions of the cervical spine were also generated. RADIATION DOSE REDUCTION: This exam was performed according to the departmental dose-optimization program which includes automated exposure control, adjustment of the mA and/or kV according to patient size and/or use of iterative reconstruction technique. COMPARISON:  Head CT dated 10/31/2018.  FINDINGS: CT HEAD FINDINGS Brain: Mild age-related atrophy and chronic microvascular ischemic changes. There is no acute intracranial hemorrhage. No mass effect or midline shift. No extra-axial fluid collection. Vascular: No hyperdense vessel or unexpected calcification. Skull: Normal. Negative for fracture or focal lesion. Sinuses/Orbits: No acute finding. Other: None CT CERVICAL SPINE FINDINGS Alignment: No acute subluxation. Skull base and vertebrae: No acute fracture. Soft tissues and spinal canal: No prevertebral fluid or swelling. No visible canal hematoma. Disc levels:  Multilevel degenerative changes. Upper chest: Negative. Other: None IMPRESSION: 1. No acute intracranial pathology. Mild age-related atrophy and chronic microvascular ischemic changes. 2. No acute cervical spine pathology. Multilevel degenerative changes. Electronically Signed   By: Anner Crete M.D.   On: 06/29/2022 02:57   CT Head Wo Contrast  Result Date: 06/29/2022 CLINICAL DATA:  Trauma. EXAM: CT HEAD WITHOUT CONTRAST CT CERVICAL SPINE WITHOUT CONTRAST TECHNIQUE: Multidetector CT imaging of the head and cervical spine was performed following the standard protocol without intravenous contrast. Multiplanar CT image reconstructions of the cervical spine were also generated. RADIATION DOSE REDUCTION: This exam was performed according to the departmental dose-optimization program which includes automated exposure control, adjustment of the mA and/or kV according to patient size and/or use of iterative reconstruction technique. COMPARISON:  Head CT dated 10/31/2018. FINDINGS: CT HEAD FINDINGS Brain: Mild age-related atrophy and chronic microvascular ischemic changes. There is no acute intracranial hemorrhage. No mass effect or midline shift. No extra-axial fluid collection. Vascular: No hyperdense vessel or unexpected calcification. Skull: Normal. Negative for fracture or focal lesion. Sinuses/Orbits: No acute finding. Other: None CT  CERVICAL SPINE FINDINGS Alignment: No acute subluxation. Skull base and vertebrae: No acute fracture. Soft tissues and spinal canal: No prevertebral fluid or swelling. No visible canal hematoma. Disc levels:  Multilevel degenerative changes. Upper chest: Negative. Other: None IMPRESSION: 1. No acute intracranial pathology. Mild age-related atrophy and chronic microvascular ischemic changes. 2. No acute cervical spine pathology. Multilevel degenerative changes. Electronically Signed   By: Anner Crete M.D.   On: 06/29/2022 02:57   DG Chest Port 1 View  Result Date: 06/29/2022 CLINICAL DATA:  Fall. EXAM: PORTABLE CHEST 1 VIEW COMPARISON:  03/19/2019. FINDINGS: Heart is enlarged and the mediastinal contour is within normal limits. Atherosclerotic calcification of the aorta is noted. No consolidation, effusion, or pneumothorax. No acute osseous abnormality. IMPRESSION: No active disease. Electronically Signed   By: Brett Fairy M.D.   On: 06/29/2022 00:29   DG Knee Complete 4 Views Right  Result Date: 06/29/2022 CLINICAL DATA:  Fall. EXAM: RIGHT KNEE - COMPLETE 4+ VIEW COMPARISON:  02/07/2013. FINDINGS: No evidence of fracture, dislocation, or joint effusion. Mild tricompartmental degenerative changes are noted. Soft tissue swelling is present anterior to the knee. IMPRESSION: 1. No acute fracture or dislocation. 2. Mild tricompartmental degenerative changes. Electronically Signed   By: Brett Fairy M.D.   On: 06/29/2022 00:28   DG HIPS BILAT WITH PELVIS 3-4 VIEWS  Result Date: 06/29/2022 CLINICAL DATA:  Fall. EXAM: DG HIP (WITH OR WITHOUT PELVIS) 3-4V BILAT COMPARISON:  10/27/2016, 12/19/2020, 10/06/2019. FINDINGS: There is no evidence of acute hip fracture or dislocation. There is bony deformity of the inferior pubic ramus on the left which is  new from the previous exam. Fixation hardware is present in the left femur with bony deformity of the mid femoral shaft compatible with old healed fracture.  Joint space is maintained at the hips bilaterally. Degenerative changes are present in the lower lumbar spine. IMPRESSION: 1. Bony deformity of the inferior pubic ramus on the left which is new from 2020, suggesting old healed fracture. Clinical correlation is recommended to exclude acute fracture. 2. Old healed fracture of the distal left femur with hardware in place. Electronically Signed   By: Brett Fairy M.D.   On: 06/29/2022 00:25     Future Appointments  Date Time Provider [place]  07/12/2022 11:00 AM Brunetta Genera, MD [facility] None      LOS: 6 days

## 2022-07-05 NOTE — Discharge Summary (Signed)
Physician Discharge Summary  MARAI TEEHAN TOI:[id number] DOB: December 23, 1945 DOA: 06/28/2022  PCP: Lois Huxley, PA  Admit date: 06/28/2022 Discharge date: 07/05/2022  Admitted From: Home Disposition: Home  Recommendations for Outpatient Follow-up:  Follow up with PCP in 1 week with repeat CBC/CMP Outpatient follow-up with oncology/Dr. Irene Limbo at earliest convenience Follow-up with GI/Dr. Benson Norway as an outpatient Follow up in ED if symptoms worsen or new appear   Home Health: Home health PT Equipment/Devices: None  Discharge Condition: Guarded CODE STATUS: Full Diet recommendation: Heart healthy  Brief/Interim Summary: 76 y.o. female with medical history significant of alcohol abuse, chronic pain with history of oxycodone overdose in the past, depression, asthma, GERD, hyperlipidemia, psoriasis, left renal cell carcinoma status post partial nephrectomy in 2012, history of cholecystectomy presented with suspected accidental oxycodone overdose as she was lethargic on initial evaluation.  On presentation, she had leukocytosis with elevated LFTs, T. bili of 8.2, ammonia 59.  Chest x-ray showed no active disease.  She was found to have pancreatic head mass along with biliary and pancreatic duct dilatation on imaging along with nonocclusive thrombus in the superior mesenteric vein with changes of possible cirrhosis in the liver and mild to moderate hydroureteronephrosis on the right with possible distal right ureter calculus or phlebolith.  GI urology and oncology were consulted.  She underwent EUS and ERCP with CBD stenting on 07/02/2022 by GI.  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  Oncology has evaluated the patient and patient will need to follow-up with oncology as an outpatient at earliest convenience.  PT recommended home health PT.  She will be discharged home today with outpatient follow-up with PCP/oncology/GI.    Discharge Diagnoses:   New diagnosis of pancreatic cancer with biliary and  pancreatic duct dilatation with biliary stricture and elevated LFTs -Status post ERCP and EUS on 07/02/2022 and placement of metallic CBD stent by GI.  Pathology consistent with adenocarcinoma.  Oncology following and recommending outpatient follow-up with oncology for deciding neck steps. -Monitor LFTs as an outpatient. -Follow-up with GI as well as an outpatient.   Nonocclusive SMV thrombus -Oncology recommends no anticoagulation at this time  Rhabdomyolysis -Treated with IV fluids.  CK improved.  Off IV fluids.  Obstructive uropathy UTI: Present on admission -CT showed mild to moderate hydro ureteral nephrosis with possible distal right ureter calculus or phlebolith.  Urology evaluation appreciated: Since patient's hydronephrosis resolved after the bladder was empty, it was not probably due to ureteral calculus but was possibly due to phlebolith.  Urology signed off.  No work-up needed. -Currently on Rocephin.  Today is day #7.  Urine culture grew E. coli.  Monitor for any further antibiotics.   Alcohol abuse -Treated with CIWA protocol.  Continue thiamine, multivitamin and folic acid on discharge.   Hypokalemia-improved   Hypomagnesemia--improved    leukocytosis -resolved  Microcytic anemia/iron deficiency anemia -Oncology has ordered 1 dose of IV iron.  We will continue oral iron on discharge.   Generalized deconditioning -PT recommends home health PT Discharge Instructions  Discharge Instructions     Diet - low sodium heart healthy   Complete by: As directed    Increase activity slowly   Complete by: As directed    Increase activity slowly   Complete by: As directed       Allergies as of 07/05/2022       Reactions   Azithromycin Itching, Swelling   Morphine And Related Itching        Medication List  STOP taking these medications    [employer] Melatonin 10 MG Caps Generic drug: Melatonin   hydrochlorothiazide 12.5 MG tablet Commonly known as:  HYDRODIURIL   Influenza vac split quadrivalent PF 0.5 ML injection Commonly known as: FLUZONE HIGH-DOSE   LORazepam 1 MG tablet Commonly known as: ATIVAN   polyethylene glycol 17 g packet Commonly known as: MIRALAX / GLYCOLAX   Prevnar 13 Susp injection Generic drug: pneumococcal 13-valent conjugate vaccine   propranolol 10 MG tablet Commonly known as: INDERAL   senna-docusate 8.6-50 MG tablet Commonly known as: Senokot-S   traZODone 50 MG tablet Commonly known as: DESYREL       TAKE these medications    albuterol 108 (90 Base) MCG/ACT inhaler Commonly known as: VENTOLIN HFA Inhale 2 puffs into the lungs every 6 (six) hours as needed for wheezing or shortness of breath. What changed: when to take this   cholestyramine 4 g packet Commonly known as: QUESTRAN Take 1 packet (4 g total) by mouth See admin instructions. 3-4 prn   clobetasol cream 0.05 % Commonly known as: TEMOVATE Apply 1 application  topically 2 (two) times daily as needed (flare ups).   cyclobenzaprine 10 MG tablet Commonly known as: FLEXERIL Take 1 tablet (10 mg total) by mouth 2 (two) times daily as needed for muscle spasms. What changed:  when to take this reasons to take this   famotidine 20 MG tablet Commonly known as: PEPCID Take 20 mg by mouth 2 (two) times daily.   ferrous sulfate 325 (65 FE) MG tablet Take 1 tablet (325 mg total) by mouth 3 (three) times daily with meals.   Fluticasone-Salmeterol 500-50 MCG/DOSE Aepb Commonly known as: Advair Diskus INHALE 1 PUFF INTO THE LUNGS TWICE A DAY   folic acid 1 MG tablet Commonly known as: FOLVITE Take 1 tablet (1 mg total) by mouth daily.   gabapentin 100 MG capsule Commonly known as: NEURONTIN Take 100 mg by mouth 2 (two) times daily.   loratadine 10 MG tablet Commonly known as: CLARITIN Take 10 mg by mouth daily.   magnesium oxide 400 MG tablet Commonly known as: MAG-OX Take 1 tablet (400 mg total) by mouth 2 (two) times  daily.   montelukast 10 MG tablet Commonly known as: SINGULAIR Take 10 mg by mouth daily.   Movantik 25 MG Tabs tablet Generic drug: naloxegol oxalate Take 25 mg by mouth daily.   multivitamin with minerals Tabs tablet Take 1 tablet by mouth daily.   ondansetron 4 MG disintegrating tablet Commonly known as: Zofran ODT Take 1 tablet (4 mg total) by mouth every 8 (eight) hours as needed for nausea.   Oxycodone HCl 20 MG Tabs Take 0.5 tablets (10 mg total) by mouth every 6 (six) hours as needed. What changed:  how much to take reasons to take this   pantoprazole 40 MG tablet Commonly known as: PROTONIX Take 1 tablet (40 mg total) by mouth daily.   PARoxetine 30 MG tablet Commonly known as: PAXIL Take 30 mg by mouth daily.   potassium chloride SA 20 MEQ tablet Commonly known as: KLOR-CON M Take 1 tablet (20 mEq total) by mouth daily.   simvastatin 40 MG tablet Commonly known as: ZOCOR Take 1 tablet (40 mg total) by mouth every morning.   sucralfate 1 g tablet Commonly known as: CARAFATE Take 1 tablet by mouth 4 (four) times daily.   thiamine 100 MG tablet Commonly known as: VITAMIN B1 Take 1 tablet (100 mg total) by mouth daily.  Vascepa 1 g capsule Generic drug: icosapent Ethyl Take by mouth 2 (two) times daily.   zolpidem 5 MG tablet Commonly known as: AMBIEN Take 5 mg by mouth daily.               Durable Medical Equipment  (From admission, onward)           Start     Ordered   07/04/22 1259  For home use only DME Walker rolling  Once       Question Answer Comment  Walker: With 5 Inch Wheels   Patient needs a walker to treat with the following condition Difficulty in walking, not elsewhere classified      07/04/22 1259            Follow-up Information     [facility] Follow up.   Why: A represntative with [facility] will contact for DME: rolling walker Contact information: [place]  [address] [phone]         [facility] Follow up.   Specialty: [place] Why: A representative from [facility] will contact 24-48 hours after discharge Contact information: [address] [place] [address] [phone]         Lois Huxley, PA. Schedule an appointment as soon as possible for a visit in 1 week(s).   Specialty: Family Medicine Why: with repeat cbc/cmp Contact information: [place] [address] [phone]         Carol Ada, MD. Schedule an appointment as soon as possible for a visit in 1 week(s).   Specialty: Gastroenterology Contact information: [place], [place] [address] [phone]         Brunetta Genera, MD. Schedule an appointment as soon as possible for a visit in 1 week(s).   Specialties: Hematology, Oncology Contact information: [place] [address] [phone]                Allergies  Allergen Reactions   Azithromycin Itching and Swelling   Morphine And Related Itching    Consultations: GI/oncology/urology   Procedures/Studies: DG ERCP  Result Date: 07/02/2022 CLINICAL DATA:  Pancreatic cancer. EXAM: ERCP COMPARISON:  CT AP, 06/29/2022.  MRCP, 06/29/2022. FLUOROSCOPY: Exposure Index (as provided by the fluoroscopic device): 32.5 mGy Kerma FINDINGS: Multiple, limited oblique planar images of the RIGHT upper quadrant obtained C-arm. Images demonstrating flexible endoscopy, biliary duct cannulation, retrograde cholangiogram and metallic biliary stent placement. Marked extrahepatic biliary ductal dilation and potential CBD occlusion, with a relative decompressed appearance after metallic biliary stent placement. IMPRESSION: Fluoroscopic imaging for ERCP and metallic biliary stent placement. Marked extrahepatic biliary ductal dilation, relatively decompressed after stent placement. For complete  description of intra procedural findings, please see performing service dictation. Electronically Signed   By: Michaelle Birks M.D.   On: 07/02/2022 17:07   DG C-Arm 1-60 Min-No Report  Result Date: 07/02/2022 Fluoroscopy was utilized by the requesting physician.  No radiographic interpretation.   CT CHEST W CONTRAST  Result Date: 07/02/2022 CLINICAL DATA:  Pancreatic cancer staging.  * Tracking Code: BO * EXAM: CT CHEST WITH CONTRAST TECHNIQUE: Multidetector CT imaging of the chest was performed during intravenous contrast administration. RADIATION DOSE REDUCTION: This exam was performed according to the departmental dose-optimization program which includes automated exposure control, adjustment of the mA and/or kV according to patient size and/or use of iterative reconstruction technique. CONTRAST:  15m OMNIPAQUE IOHEXOL 300 MG/ML  SOLN COMPARISON:  MR abdomen 06/29/2022 and CT abdomen pelvis 06/29/2022. FINDINGS: Cardiovascular: Atherosclerotic calcification of the aorta, aortic valve and coronary arteries. Heart is enlarged. No pericardial effusion. Mediastinum/Nodes: No pathologically enlarged mediastinal, hilar or axillary lymph nodes. Esophagus is grossly unremarkable. Small amount of ascitic fluid is seen above the diaphragmatic hiatus. Lungs/Pleura: Image quality is degraded by respiratory motion and expiratory phase imaging. Dependent atelectasis bilaterally. No suspicious pulmonary nodules. Trace bilateral pleural fluid. Airway is unremarkable. Upper Abdomen: Better evaluated on MR abdomen 06/29/2022. Persistent ascites and biliary ductal dilatation. Musculoskeletal: No worrisome lytic or sclerotic lesions. Old sternal fracture. IMPRESSION: 1. No evidence of metastatic disease in the chest. 2. Trace bilateral pleural fluid. 3. Upper abdomen better evaluated on MR abdomen 06/29/2022. 4. Aortic atherosclerosis (ICD10-I70.0). Coronary artery calcification. Electronically Signed   By: MLorin Picket M.D.   On: 07/02/2022 13:26   MR ABDOMEN MRCP W WO CONTAST  Result Date: 06/29/2022 CLINICAL DATA:  Pancreatic head mass on CT scan earlier same day. EXAM: MRI ABDOMEN WITHOUT AND WITH CONTRAST (INCLUDING MRCP) TECHNIQUE: Multiplanar multisequence MR imaging of the abdomen was performed both before and after the administration of intravenous contrast. Heavily T2-weighted images of the biliary and pancreatic ducts were obtained, and three-dimensional MRCP images were rendered by post processing. CONTRAST:  622mGADAVIST GADOBUTROL 1 MMOL/ML IV SOLN COMPARISON:  CT scan abdomen/pelvis 06/29/2022 FINDINGS: Lower chest: Unremarkable. Hepatobiliary: No suspicious focal abnormality within the liver parenchyma. Gallbladder surgically absent. Mild intrahepatic biliary duct dilatation is associated with 16 mm diameter common duct in the porta hepatis and dilatation of the cystic duct and common bile duct abruptly tapering just proximal to the ampulla. Pancreas: 6.5 x 5.8 x 4.9 cm rim enhancing ill-defined mass is identified in the anterior pancreatic head. Mild diffuse dilatation of the main pancreatic duct although mass lesion is anterior to the common bile duct and main pancreatic duct. Lesion probably abuts the inferior aspect portal vein and coronal imaging shows lateral margin of the lesion indistinguishable from the medial wall of the descending duodenum (see coronal 16/12). 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 Spleen: No splenomegaly. No focal mass lesion. Adrenals/Urinary Tract: No adrenal nodule or mass. Kidneys unremarkable. Stomach/Bowel: Stomach is nondistended. Although assessment is motion degraded, the anterior pancreatic head mass is contiguous with the posterior wall the duodenum and medial wall of the descending duodenum.  Coronal T2 imaging is highly suspicious for tumor invasion of the medial wall of the descending duodenum (see axial T2 image 30 of series 2 and axial T2 haste image 29 series 16. This region is not well evaluated on postcontrast imaging due to motion artifact but appearance is similar suggesting duodenal invasion. Vascular/Lymphatic: Other:  Trace free fluid noted around the liver. Musculoskeletal: No focal suspicious marrow enhancement within the visualized bony anatomy. Skip that trace free fluid noted around the liver IMPRESSION: 1. 6.5 x 5.8 x 4.9 cm rim enhancing mass in the anterior pancreatic head is contiguous with the posterior wall the duodenum bulb and medial wall of the descending duodenum. Imaging features suggest direct invasion of the medial wall of the descending duodenum. Primary pancreatic adenocarcinoma would be distinct consideration. Primary duodenal neoplasm with secondary involvement of the pancreatic head is considered less likely but not excluded. Lesion probably abuts the inferior aspect of the  portal vein. 2. 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 3. Intrahepatic and extrahepatic biliary duct dilatation with abrupt cut off of the common bile duct just proximal to the ampulla. This is associated with mild diffuse dilatation of the main pancreatic duct. 4. Mass lesion compresses the IVC posteriorly against the spine. IVC involvement not excluded. 5. No evidence for hepatic metastases nor metastatic lymphadenopathy in the abdomen. 6. Trace free fluid around the liver. Electronically Signed   By: Misty Stanley M.D.   On: 06/29/2022 12:08   MR 3D Recon At Scanner  Result Date: 06/29/2022 CLINICAL DATA:  Pancreatic head mass on CT scan earlier same day. EXAM: MRI ABDOMEN WITHOUT AND WITH CONTRAST (INCLUDING MRCP) TECHNIQUE: Multiplanar multisequence MR imaging of the abdomen was performed both before and after the administration of  intravenous contrast. Heavily T2-weighted images of the biliary and pancreatic ducts were obtained, and three-dimensional MRCP images were rendered by post processing. CONTRAST:  79m GADAVIST GADOBUTROL 1 MMOL/ML IV SOLN COMPARISON:  CT scan abdomen/pelvis 06/29/2022 FINDINGS: Lower chest: Unremarkable. Hepatobiliary: No suspicious focal abnormality within the liver parenchyma. Gallbladder surgically absent. Mild intrahepatic biliary duct dilatation is associated with 16 mm diameter common duct in the porta hepatis and dilatation of the cystic duct and common bile duct abruptly tapering just proximal to the ampulla. Pancreas: 6.5 x 5.8 x 4.9 cm rim enhancing ill-defined mass is identified in the anterior pancreatic head. Mild diffuse dilatation of the main pancreatic duct although mass lesion is anterior to the common bile duct and main pancreatic duct. Lesion probably abuts the inferior aspect portal vein and coronal imaging shows lateral margin of the lesion indistinguishable from the medial wall of the descending duodenum (see coronal 16/12). 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 Spleen: No splenomegaly. No focal mass lesion. Adrenals/Urinary Tract: No adrenal nodule or mass. Kidneys unremarkable. Stomach/Bowel: Stomach is nondistended. Although assessment is motion degraded, the anterior pancreatic head mass is contiguous with the posterior wall the duodenum and medial wall of the descending duodenum. Coronal T2 imaging is highly suspicious for tumor invasion of the medial wall of the descending duodenum (see axial T2 image 30 of series 2 and axial T2 haste image 29 series 16. This region is not well evaluated on postcontrast imaging due to motion artifact but appearance is similar suggesting duodenal invasion. Vascular/Lymphatic:  Other:  Trace free fluid noted around the liver. Musculoskeletal: No focal suspicious marrow enhancement within the visualized bony anatomy. Skip that trace free fluid noted around the liver IMPRESSION: 1. 6.5 x 5.8 x 4.9 cm rim enhancing mass in the anterior pancreatic head is contiguous with the posterior wall the duodenum bulb and medial wall of the descending duodenum. Imaging features suggest direct invasion of the medial wall of the descending duodenum. Primary pancreatic adenocarcinoma would be distinct consideration. Primary duodenal neoplasm with secondary involvement of the pancreatic head is considered less likely but not excluded. Lesion probably abuts the inferior aspect of the portal vein. 2. 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 3. Intrahepatic and extrahepatic biliary duct dilatation with abrupt cut off of the common bile duct just proximal to the ampulla. This is associated with mild diffuse dilatation of the main pancreatic duct. 4. Mass lesion compresses the IVC posteriorly against the spine. IVC involvement not  excluded. 5. No evidence for hepatic metastases nor metastatic lymphadenopathy in the abdomen. 6. Trace free fluid around the liver. Electronically Signed   By: Misty Stanley M.D.   On: 06/29/2022 12:08   CT Abdomen Pelvis W Contrast  Result Date: 06/29/2022 CLINICAL DATA:  Found on floor beside bed, left hip pain. EXAM: CT ABDOMEN AND PELVIS WITH CONTRAST TECHNIQUE: Multidetector CT imaging of the abdomen and pelvis was performed using the standard protocol following bolus administration of intravenous contrast. RADIATION DOSE REDUCTION: This exam was performed according to the departmental dose-optimization program which includes automated exposure control, adjustment of the mA and/or kV according to patient size and/or use of iterative reconstruction technique. CONTRAST:  32m OMNIPAQUE IOHEXOL 300 MG/ML  SOLN COMPARISON:   10/06/2019. FINDINGS: Lower chest: Atelectasis is present at the lung bases. Hepatobiliary: There is mild nodularity of the liver. No focal abnormality is identified. Intrahepatic and extrahepatic biliary ductal dilatation is noted. The common bile duct measures up to 1.6 cm in diameter. The gallbladder is surgically absent. Pancreas: Hypodense masslike region is present in the pancreatic head measuring 6.5 x 4.8 x 6.5 cm. There is dilatation of the pancreatic duct measuring up to 6 mm. Peripancreatic fat stranding is noted at the head. Spleen: Normal in size without focal abnormality. Adrenals/Urinary Tract: No adrenal nodule or mass. The kidneys enhance symmetrically. A punctate calculus is noted in the upper pole of the right kidney. There is mild-to-moderate hydroureteronephrosis on the right with urothelial enhancement with a 3 mm stone in the anticipated region of the distal right ureter, axial image 67 possible ureteral calculus or phlebolith. The bladder is within normal limits. Stomach/Bowel: Stomach is within normal limits. There is bowel wall thickening involving the first second and third portions of the duodenum. No bowel obstruction, free air, or pneumatosis. The appendix is not seen. Vascular/Lymphatic: The portal vein and splenic vein are within normal limits. Nonocclusive thrombus is present in the superior mesenteric vein at the level of the pancreatic lesion. Aortic atherosclerosis without evidence of aneurysm. No abdominal or pelvic lymphadenopathy. Reproductive: Status post hysterectomy. No adnexal masses. Other: Trace amount of perihepatic fluid is noted. 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 Fixation hardware is noted in the proximal left femur. Degenerative changes are present in the thoracolumbar spine. Stable compression deformities are present in the superior endplates at L1 and L2. Severe degenerative changes are noted at L4-L5 and L5-S1,  unchanged from 2020. No acute osseous abnormality. IMPRESSION: 1. Hypodense masslike region in the pancreatic head with surrounding fat stranding measuring 6.5 x 4.8 x 6.5 cm resulting in biliary and pancreatic duct ductal dilatation. Findings are concerning for pancreatic adenocarcinoma versus pancreatitis with developing abscess. Correlation with amylase and lipase and CA 19-9 is recommended. MRI with contrast is suggested for further evaluation. 2. 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 3. Morphologic changes of cirrhosis in the liver. 4. Mild-to-moderate hydroureteronephrosis on the right with ureteral enhancement. A 3 mm calculus is present in the anticipated region of the distal right ureter, possible ureteral calculus or phlebolith. Clinical correlation is recommended to exclude superimposed infection. 5. Aortic atherosclerosis. Electronically Signed   By: LBrett FairyM.D.   On: 06/29/2022 04:15   CT Cervical Spine Wo Contrast  Result Date: 06/29/2022 CLINICAL DATA:  Trauma. EXAM: CT HEAD WITHOUT CONTRAST CT CERVICAL SPINE WITHOUT CONTRAST TECHNIQUE: Multidetector CT imaging of the head and cervical spine was performed  following the standard protocol without intravenous contrast. Multiplanar CT image reconstructions of the cervical spine were also generated. RADIATION DOSE REDUCTION: This exam was performed according to the departmental dose-optimization program which includes automated exposure control, adjustment of the mA and/or kV according to patient size and/or use of iterative reconstruction technique. COMPARISON:  Head CT dated 10/31/2018. FINDINGS: CT HEAD FINDINGS Brain: Mild age-related atrophy and chronic microvascular ischemic changes. There is no acute intracranial hemorrhage. No mass effect or midline shift. No extra-axial fluid collection. Vascular: No hyperdense vessel or unexpected calcification. Skull: Normal. Negative for fracture  or focal lesion. Sinuses/Orbits: No acute finding. Other: None CT CERVICAL SPINE FINDINGS Alignment: No acute subluxation. Skull base and vertebrae: No acute fracture. Soft tissues and spinal canal: No prevertebral fluid or swelling. No visible canal hematoma. Disc levels:  Multilevel degenerative changes. Upper chest: Negative. Other: None IMPRESSION: 1. No acute intracranial pathology. Mild age-related atrophy and chronic microvascular ischemic changes. 2. No acute cervical spine pathology. Multilevel degenerative changes. Electronically Signed   By: Anner Crete M.D.   On: 06/29/2022 02:57   CT Head Wo Contrast  Result Date: 06/29/2022 CLINICAL DATA:  Trauma. EXAM: CT HEAD WITHOUT CONTRAST CT CERVICAL SPINE WITHOUT CONTRAST TECHNIQUE: Multidetector CT imaging of the head and cervical spine was performed following the standard protocol without intravenous contrast. Multiplanar CT image reconstructions of the cervical spine were also generated. RADIATION DOSE REDUCTION: This exam was performed according to the departmental dose-optimization program which includes automated exposure control, adjustment of the mA and/or kV according to patient size and/or use of iterative reconstruction technique. COMPARISON:  Head CT dated 10/31/2018. FINDINGS: CT HEAD FINDINGS Brain: Mild age-related atrophy and chronic microvascular ischemic changes. There is no acute intracranial hemorrhage. No mass effect or midline shift. No extra-axial fluid collection. Vascular: No hyperdense vessel or unexpected calcification. Skull: Normal. Negative for fracture or focal lesion. Sinuses/Orbits: No acute finding. Other: None CT CERVICAL SPINE FINDINGS Alignment: No acute subluxation. Skull base and vertebrae: No acute fracture. Soft tissues and spinal canal: No prevertebral fluid or swelling. No visible canal hematoma. Disc levels:  Multilevel degenerative changes. Upper chest: Negative. Other: None IMPRESSION: 1. No acute  intracranial pathology. Mild age-related atrophy and chronic microvascular ischemic changes. 2. No acute cervical spine pathology. Multilevel degenerative changes. Electronically Signed   By: Anner Crete M.D.   On: 06/29/2022 02:57   DG Chest Port 1 View  Result Date: 06/29/2022 CLINICAL DATA:  Fall. EXAM: PORTABLE CHEST 1 VIEW COMPARISON:  03/19/2019. FINDINGS: Heart is enlarged and the mediastinal contour is within normal limits. Atherosclerotic calcification of the aorta is noted. No consolidation, effusion, or pneumothorax. No acute osseous abnormality. IMPRESSION: No active disease. Electronically Signed   By: Brett Fairy M.D.   On: 06/29/2022 00:29   DG Knee Complete 4 Views Right  Result Date: 06/29/2022 CLINICAL DATA:  Fall. EXAM: RIGHT KNEE - COMPLETE 4+ VIEW COMPARISON:  02/07/2013. FINDINGS: No evidence of fracture, dislocation, or joint effusion. Mild tricompartmental degenerative changes are noted. Soft tissue swelling is present anterior to the knee. IMPRESSION: 1. No acute fracture or dislocation. 2. Mild tricompartmental degenerative changes. Electronically Signed   By: Brett Fairy M.D.   On: 06/29/2022 00:28   DG HIPS BILAT WITH PELVIS 3-4 VIEWS  Result Date: 06/29/2022 CLINICAL DATA:  Fall. EXAM: DG HIP (WITH OR WITHOUT PELVIS) 3-4V BILAT COMPARISON:  10/27/2016, 12/19/2020, 10/06/2019. FINDINGS: There is no evidence of acute hip fracture or dislocation. There is bony deformity of  the inferior pubic ramus on the left which is new from the previous exam. Fixation hardware is present in the left femur with bony deformity of the mid femoral shaft compatible with old healed fracture. Joint space is maintained at the hips bilaterally. Degenerative changes are present in the lower lumbar spine. IMPRESSION: 1. Bony deformity of the inferior pubic ramus on the left which is new from 2020, suggesting old healed fracture. Clinical correlation is recommended to exclude acute fracture. 2.  Old healed fracture of the distal left femur with hardware in place. Electronically Signed   By: Brett Fairy M.D.   On: 06/29/2022 00:25      Subjective: Patient seen and examined at bedside.  Wants to go home today.  Patient no overnight fever, seizures, vomiting reported.  Discharge Exam: Vitals:   07/04/22 1940 07/05/22 0430  BP: 115/66 125/65  Pulse: 97 87  Resp: 20 20  Temp: 99.4 F (37.4 C) 99.4 F (37.4 C)  SpO2: 99% 100%    General: Pt is alert, awake, not in acute distress.  Slow to respond.  Answers some questions appropriately.  Currently on room air.  Looks chronically ill and deconditioned. Cardiovascular: rate controlled, S1/S2 + Respiratory: bilateral decreased breath sounds at bases with some scattered crackles Abdominal: Soft, NT, ND, bowel sounds + Extremities: Trace lower extremity edema; no cyanosis    The results of significant diagnostics from this hospitalization (including imaging, microbiology, ancillary and laboratory) are listed below for reference.     Microbiology: Recent Results (from the past 240 hour(s))  Culture, blood (routine x 2)     Status: None   Collection Time: 06/29/22  2:53 AM   Specimen: BLOOD  Result Value Ref Range Status   Specimen Description   Final    BLOOD RIGHT ANTECUBITAL Performed at [facility] [address], [place], [place] [address]    Special Requests   Final    BOTTLES DRAWN AEROBIC AND ANAEROBIC Blood Culture results may not be optimal due to an excessive volume of blood received in culture bottles Performed at [facility] [address], [place], [place] [address]    Culture   Final    NO GROWTH 5 DAYS Performed at [facility], [place] [address], [place], [place] [address]    Report Status 07/04/2022 FINAL  Final  Culture, blood (routine x 2)     Status: None   Collection Time: 06/29/22  2:53 AM   Specimen: BLOOD  Result Value Ref Range Status   Specimen  Description   Final    BLOOD LEFT ANTECUBITAL Performed at [facility] [address] [place], [place] [address]    Special Requests   Final    BOTTLES DRAWN AEROBIC AND ANAEROBIC Blood Culture results may not be optimal due to an excessive volume of blood received in culture bottles Performed at [facility] [address] [place], [place] [address]    Culture   Final    NO GROWTH 5 DAYS Performed at [facility], [place] [address], [place], [place] [address]    Report Status 07/04/2022 FINAL  Final  Urine Culture     Status: Abnormal   Collection Time: 06/29/22  5:14 AM   Specimen: In/Out Cath Urine  Result Value Ref Range Status   Specimen Description   Final    IN/OUT CATH URINE Performed at [facility] [address] [place], [place]  [address]    Special Requests   Final    NONE Performed at [facility], [place] [address], [place], [place] [address]    Culture >=100,000 COLONIES/mL ESCHERICHIA COLI (A)  Final   Report Status 07/01/2022 FINAL  Final   Organism ID, Bacteria ESCHERICHIA COLI (A)  Final      Susceptibility   Escherichia coli - MIC*    AMPICILLIN <=2 SENSITIVE Sensitive     CEFAZOLIN <=4 SENSITIVE Sensitive     CEFEPIME <=0.12 SENSITIVE Sensitive     CEFTRIAXONE <=0.25 SENSITIVE Sensitive     CIPROFLOXACIN <=0.25 SENSITIVE Sensitive     GENTAMICIN <=1 SENSITIVE Sensitive     IMIPENEM <=0.25 SENSITIVE Sensitive     NITROFURANTOIN <=16 SENSITIVE Sensitive     TRIMETH/SULFA <=20 SENSITIVE Sensitive     AMPICILLIN/SULBACTAM <=2 SENSITIVE Sensitive     PIP/TAZO <=4 SENSITIVE Sensitive     * >=100,000 COLONIES/mL ESCHERICHIA COLI     Labs: BNP (last 3 results) No results for input(s): "BNP" in the last 8760 hours. Basic Metabolic Panel: Recent Labs  Lab 07/01/22 0342 07/02/22 0421 07/02/22 1646 07/03/22 0414 07/04/22 0420 07/05/22 0414  NA 137 137  --  135 138 137   K 2.9* 3.2* 3.8 3.4* 3.4* 3.9  CL 103 102  --  105 106 108  CO2 24 24  --  '23 24 22  '$ GLUCOSE 89 91  --  132* 122* 106*  BUN 8 6*  --  7* 6* <5*  CREATININE 0.46 0.43*  --  0.54 0.71 0.52  CALCIUM 7.9* 7.6*  --  7.4* 7.9* 8.5*  MG  --  1.0* 1.9 1.6* 1.7 1.7   Liver Function Tests: Recent Labs  Lab 06/29/22 0705 06/30/22 0348 07/01/22 0342 07/04/22 0420 07/05/22 0414  AST 212* 202* 192* 67* 58*  ALT 73* 70* 75* 51* 50*  ALKPHOS 481* 421* 380* 328* 329*  BILITOT 7.6* 8.3* 9.8* 4.8* 4.8*  PROT 6.3* 5.7* 5.6* 5.8* 6.4*  ALBUMIN 2.7* 2.3* 2.2* 2.2* 2.5*   Recent Labs  Lab 06/29/22 0032  LIPASE 49   Recent Labs  Lab 06/29/22 0133  AMMONIA 59*   CBC: Recent Labs  Lab 06/29/22 0049 06/29/22 0705 06/30/22 0348 07/02/22 0421 07/04/22 0420  WBC 15.1* 15.0* 12.3* 11.4* 10.5  NEUTROABS 12.0*  --   --   --  6.5  HGB 10.0* 8.8* 8.6* 9.4* 8.9*  HCT 29.6* 27.0* 25.2* 27.5* 26.7*  MCV 73.8* 75.0* 72.0* 71.2* 72.6*  PLT 426* 421* 401* 365 409*   Cardiac Enzymes: Recent Labs  Lab 06/29/22 0032 06/29/22 0705 07/01/22 0342 07/04/22 0420  CKTOTAL 1,903* 3,101* 1,378* 224   BNP: Invalid input(s): "POCBNP" CBG: Recent Labs  Lab 06/29/22 0031 07/03/22 1708  GLUCAP 102* 143*   D-Dimer No results for input(s): "DDIMER" in the last 72 hours. Hgb A1c No results for input(s): "HGBA1C" in the last 72 hours. Lipid Profile No results for input(s): "CHOL", "HDL", "LDLCALC", "TRIG", "CHOLHDL", "LDLDIRECT" in the last 72 hours. Thyroid function studies No results for input(s): "TSH", "T4TOTAL", "T3FREE", "THYROIDAB" in the last 72 hours.  Invalid input(s): "FREET3" Anemia work up No results for input(s): "VITAMINB12", "FOLATE", "FERRITIN", "TIBC", "IRON", "RETICCTPCT" in the last 72 hours. Urinalysis    Component Value Date/Time   COLORURINE AMBER (A) 06/29/2022 [place] 06/29/2022 0514   LABSPEC 1.011 06/29/2022 [place] 6.0 06/29/2022 [place] 06/29/2022 8413  HGBUR LARGE (A) 06/29/2022 0514   HGBUR negative 07/07/2007 0838   BILIRUBINUR NEGATIVE 06/29/2022 0514   KETONESUR NEGATIVE 06/29/2022 0514   PROTEINUR NEGATIVE 06/29/2022 0514   UROBILINOGEN 1.0 04/22/2015 0020   NITRITE POSITIVE (A) 06/29/2022 0514   LEUKOCYTESUR NEGATIVE 06/29/2022 0514   Sepsis Labs Recent Labs  Lab 06/29/22 0705 06/30/22 0348 07/02/22 0421 07/04/22 0420  WBC 15.0* 12.3* 11.4* 10.5   Microbiology Recent Results (from the past 240 hour(s))  Culture, blood (routine x 2)     Status: None   Collection Time: 06/29/22  2:53 AM   Specimen: BLOOD  Result Value Ref Range Status   Specimen Description   Final    BLOOD RIGHT ANTECUBITAL Performed at [facility], [place] [address], [place], [place] [address]    Special Requests   Final    BOTTLES DRAWN AEROBIC AND ANAEROBIC Blood Culture results may not be optimal due to an excessive volume of blood received in culture bottles Performed at [facility] [address], [place], [place] [address]    Culture   Final    NO GROWTH 5 DAYS Performed at [facility], [place] [address] [place], [place] [address]    Report Status 07/04/2022 FINAL  Final  Culture, blood (routine x 2)     Status: None   Collection Time: 06/29/22  2:53 AM   Specimen: BLOOD  Result Value Ref Range Status   Specimen Description   Final    BLOOD LEFT ANTECUBITAL Performed at [facility] [address] [place], [place] [address]    Special Requests   Final    BOTTLES DRAWN AEROBIC AND ANAEROBIC Blood Culture results may not be optimal due to an excessive volume of blood received in culture bottles Performed at [facility] [address], [place], [place] [address]    Culture   Final    NO GROWTH 5 DAYS Performed at [facility], [place] [address], [place], [place] [address]    Report Status 07/04/2022 FINAL  Final   Urine Culture     Status: Abnormal   Collection Time: 06/29/22  5:14 AM   Specimen: In/Out Cath Urine  Result Value Ref Range Status   Specimen Description   Final    IN/OUT CATH URINE Performed at [facility] [address], [place], [place] [address]    Special Requests   Final    NONE Performed at [facility], [place] [address] [place], [place] [address]    Culture >=100,000 COLONIES/mL ESCHERICHIA COLI (A)  Final   Report Status 07/01/2022 FINAL  Final   Organism ID, Bacteria ESCHERICHIA COLI (A)  Final      Susceptibility   Escherichia coli - MIC*    AMPICILLIN <=2 SENSITIVE Sensitive     CEFAZOLIN <=4 SENSITIVE Sensitive     CEFEPIME <=0.12 SENSITIVE Sensitive     CEFTRIAXONE <=0.25 SENSITIVE Sensitive     CIPROFLOXACIN <=0.25 SENSITIVE Sensitive     GENTAMICIN <=1 SENSITIVE Sensitive     IMIPENEM <=0.25 SENSITIVE Sensitive     NITROFURANTOIN <=16 SENSITIVE Sensitive     TRIMETH/SULFA <=20 SENSITIVE Sensitive     AMPICILLIN/SULBACTAM <=2 SENSITIVE Sensitive     PIP/TAZO <=4 SENSITIVE Sensitive     * >=100,000 COLONIES/mL ESCHERICHIA COLI     Time coordinating discharge: 35 minutes  SIGNED:   Aline August, MD  [facility] 07/05/2022, 11:11 AM

## 2022-07-05 NOTE — TOC Transition Note (Signed)
Transition of Care [facility]) - CM/SW Discharge Note   Patient Details  Name: [name] MRN: [id number] Date of Birth: 05/23/1946  [facility] CM/SW Contact:  Roseanne Kaufman, RN Phone Number: 07/05/2022, 9:30 AM   Clinical Narrative:   [facility] will deliver rolling walker, awaiting discharge summary/orders.   No additional TOC needs at this time.    Final next level of care: [facility] Barriers to Discharge: No Barriers Identified   Patient Goals and CMS Choice Patient states their goals for this hospitalization and ongoing recovery are:: Return Home CMS Medicare.gov Compare Post Acute Care list provided to:: Patient Choice offered to / list presented to : Patient  Discharge Placement                Patient to be transferred to facility by: N/A Name of family member notified: Daughter's - [place]ola Hope & Barbara Hart    Discharge Plan and Services In-house Referral: NA Discharge Planning Services: CM Consult Post Acute Care Choice: Durable Medical Equipment, Home Health          DME Arranged: [place] rolling DME Agency: [employer] Date DME Agency Contacted: 07/04/22 Time DME Agency Contacted: [id number] Representative spoke with at DME Agency: [place]: PT [place]: [place] Date [place]: 07/04/22 Time [place]: [phone] Representative spoke with at [place]: [place] Determinants of Health ([place]) Interventions     Readmission Risk Interventions     No data to display

## 2022-07-05 NOTE — Progress Notes (Signed)
Mobility Specialist - Progress Note    07/05/22 1059  Mobility  Activity Ambulated with assistance in hallway  Range of Motion/Exercises Active  Level of Assistance Standby assist, set-up cues, supervision of patient - no hands on  Assistive Device Front wheel walker  Distance Ambulated (ft) 120 ft  Activity Response Tolerated well  $Mobility charge 1 Mobility   Pt was in bed and agreeable to mobilize. Pt had no complaints during ambulation but did need cues to look straight ahead to help with posture. Was left on [facility] with NT in room to assist further.   Ferd Hibbs Mobility Specialist

## 2022-07-05 NOTE — Plan of Care (Signed)

## 2022-07-12 ENCOUNTER — Telehealth: Payer: Self-pay | Admitting: Hematology

## 2022-07-12 ENCOUNTER — Inpatient Hospital Stay: Payer: Medicare HMO | Attending: Hematology | Admitting: Hematology

## 2022-07-12 ENCOUNTER — Other Ambulatory Visit: Payer: Self-pay | Admitting: Hematology

## 2022-07-12 ENCOUNTER — Other Ambulatory Visit: Payer: Self-pay

## 2022-07-12 DIAGNOSIS — C259 Malignant neoplasm of pancreas, unspecified: Secondary | ICD-10-CM

## 2022-07-12 DIAGNOSIS — G893 Neoplasm related pain (acute) (chronic): Secondary | ICD-10-CM

## 2022-07-12 MED ORDER — OXYCODONE HCL 10 MG PO TABS: 10.0000 mg | ORAL_TABLET | Freq: Four times a day (QID) | ORAL | 0 refills | Status: AC | PRN

## 2022-07-12 NOTE — Telephone Encounter (Signed)
Mrs. [name] did not show up for her clinic visit posthospitalization for continued evaluation and management of pancreatic cancer. I called her list of numbers and there is no answer on the patient's listed phone numbers. I was able to get through to her daughter [name] also helping with finding her goals of care in the hospital. Ms. [name] notes that her mother is still drinking alcohol and smoking and does not want to follow-up with any doctors to address her newly diagnosed pancreatic cancer.  She also declined to come for her clinic visit today. Emily notes that her mother is having abdominal pain and is going to run out of her oxycodone.  She has had a history of alcohol, tobacco and narcotic abuse in the past but has obvious pathology from her pancreatic cancer invading her duodenum that is likely causing pain. Emily notes patient remains about the same as she was in the hospital when she was confused on and off with limited p.o. intake. The patient tells her daughter that she is not inclined to pursue any aggressive treatments including chemotherapy or radiation therapy and does not want to go to the hospital or the clinic for continued cares.  Emily also notes that she does not feel the patient is in any condition to get any chemotherapy.  We discussed that pancreatic cancer tends to be quite aggressive and in her case the cancer is already invaded the duodenum and is causing GI bleeding which could be fatal.  It is also unclear if she has arterial and venous encroachment from the tumor.  We discussed that with patient's preference not to follow-up for medical cares and her medical issues that preclude aggressive surgery she probably would not be a candidate for surgery or other systemic chemotherapy.  Emily noted the patient just wants to be kept comfortable at home and the patient and her are open to being referred to home hospice.  Emily notes that she is okay with [place]  referring the patient to [facility] home hospice for locally advanced or metastatic pancreatic adenocarcinoma.  I sent in a prescription for oxycodone as needed for cancer-related pain tell her hospice team can enroll her and take over symptom management. Emily notes no other acute symptoms other than her abdominal pain at this time.  [name]

## 2022-07-19 ENCOUNTER — Inpatient Hospital Stay: Payer: Medicare HMO | Admitting: Hematology

## 2022-08-11 DEATH — deceased
